# Patient Record
Sex: Male | Born: 1945
Health system: Southern US, Community
[De-identification: ages and names within clinical notes are randomized; demographics above are authoritative.]

## PROBLEM LIST (undated history)

## (undated) DIAGNOSIS — I471 Supraventricular tachycardia: Principal | ICD-10-CM

## (undated) DIAGNOSIS — J42 Unspecified chronic bronchitis: Secondary | ICD-10-CM

## (undated) DIAGNOSIS — J302 Other seasonal allergic rhinitis: Secondary | ICD-10-CM

## (undated) DIAGNOSIS — F419 Anxiety disorder, unspecified: Secondary | ICD-10-CM

## (undated) DIAGNOSIS — Z9289 Personal history of other medical treatment: Secondary | ICD-10-CM

## (undated) DIAGNOSIS — R011 Cardiac murmur, unspecified: Secondary | ICD-10-CM

## (undated) DIAGNOSIS — K429 Umbilical hernia without obstruction or gangrene: Secondary | ICD-10-CM

## (undated) DIAGNOSIS — Z8719 Personal history of other diseases of the digestive system: Secondary | ICD-10-CM

## (undated) DIAGNOSIS — M1990 Unspecified osteoarthritis, unspecified site: Secondary | ICD-10-CM

## (undated) DIAGNOSIS — Z973 Presence of spectacles and contact lenses: Secondary | ICD-10-CM

## (undated) DIAGNOSIS — R112 Nausea with vomiting, unspecified: Secondary | ICD-10-CM

## (undated) DIAGNOSIS — I420 Dilated cardiomyopathy: Secondary | ICD-10-CM

## (undated) DIAGNOSIS — R296 Repeated falls: Secondary | ICD-10-CM

## (undated) DIAGNOSIS — D649 Anemia, unspecified: Secondary | ICD-10-CM

## (undated) DIAGNOSIS — D126 Benign neoplasm of colon, unspecified: Secondary | ICD-10-CM

## (undated) DIAGNOSIS — J189 Pneumonia, unspecified organism: Secondary | ICD-10-CM

## (undated) DIAGNOSIS — F32A Depression, unspecified: Secondary | ICD-10-CM

## (undated) DIAGNOSIS — I491 Atrial premature depolarization: Secondary | ICD-10-CM

## (undated) DIAGNOSIS — I739 Peripheral vascular disease, unspecified: Secondary | ICD-10-CM

## (undated) DIAGNOSIS — I10 Essential (primary) hypertension: Secondary | ICD-10-CM

## (undated) DIAGNOSIS — N400 Enlarged prostate without lower urinary tract symptoms: Secondary | ICD-10-CM

## (undated) DIAGNOSIS — K219 Gastro-esophageal reflux disease without esophagitis: Secondary | ICD-10-CM

## (undated) DIAGNOSIS — F1027 Alcohol dependence with alcohol-induced persisting dementia: Secondary | ICD-10-CM

## (undated) DIAGNOSIS — E785 Hyperlipidemia, unspecified: Secondary | ICD-10-CM

## (undated) DIAGNOSIS — K7460 Unspecified cirrhosis of liver: Secondary | ICD-10-CM

## (undated) DIAGNOSIS — H9190 Unspecified hearing loss, unspecified ear: Secondary | ICD-10-CM

## (undated) DIAGNOSIS — Z9889 Other specified postprocedural states: Secondary | ICD-10-CM

## (undated) DIAGNOSIS — I2510 Atherosclerotic heart disease of native coronary artery without angina pectoris: Secondary | ICD-10-CM

## (undated) DIAGNOSIS — F329 Major depressive disorder, single episode, unspecified: Secondary | ICD-10-CM

## (undated) HISTORY — DX: Anemia, unspecified: D64.9

## (undated) HISTORY — DX: Supraventricular tachycardia: I47.1

## (undated) HISTORY — DX: Hyperlipidemia, unspecified: E78.5

## (undated) HISTORY — PX: HEMORRHOID SURGERY: SHX153

## (undated) HISTORY — DX: Pneumonia, unspecified organism: J18.9

## (undated) HISTORY — DX: Atherosclerotic heart disease of native coronary artery without angina pectoris: I25.10

## (undated) HISTORY — DX: Essential (primary) hypertension: I10

## (undated) HISTORY — PX: TONSILLECTOMY: SUR1361

## (undated) HISTORY — PX: UPPER GASTROINTESTINAL ENDOSCOPY: SHX188

## (undated) HISTORY — DX: Benign prostatic hyperplasia without lower urinary tract symptoms: N40.0

## (undated) HISTORY — DX: Dilated cardiomyopathy: I42.0

## (undated) HISTORY — PX: FRACTURE SURGERY: SHX138

## (undated) HISTORY — DX: Major depressive disorder, single episode, unspecified: F32.9

## (undated) HISTORY — DX: Depression, unspecified: F32.A

## (undated) HISTORY — DX: Benign neoplasm of colon, unspecified: D12.6

## (undated) HISTORY — DX: Gastro-esophageal reflux disease without esophagitis: K21.9

## (undated) HISTORY — DX: Atrial premature depolarization: I49.1

## (undated) HISTORY — PX: BACK SURGERY: SHX140

## (undated) HISTORY — DX: Anxiety disorder, unspecified: F41.9

## (undated) HISTORY — PX: DUPUYTREN CONTRACTURE RELEASE: SHX1478

---

## 1994-01-11 DIAGNOSIS — J189 Pneumonia, unspecified organism: Secondary | ICD-10-CM

## 1994-01-11 HISTORY — DX: Pneumonia, unspecified organism: J18.9

## 1997-05-11 ENCOUNTER — Encounter: Payer: Self-pay | Admitting: Family Medicine

## 1997-05-11 DIAGNOSIS — N400 Enlarged prostate without lower urinary tract symptoms: Secondary | ICD-10-CM

## 1997-05-11 DIAGNOSIS — E785 Hyperlipidemia, unspecified: Secondary | ICD-10-CM

## 1997-05-11 DIAGNOSIS — I10 Essential (primary) hypertension: Secondary | ICD-10-CM

## 1997-05-11 HISTORY — DX: Essential (primary) hypertension: I10

## 1997-05-11 HISTORY — DX: Hyperlipidemia, unspecified: E78.5

## 1997-05-11 HISTORY — DX: Benign prostatic hyperplasia without lower urinary tract symptoms: N40.0

## 1997-05-11 LAB — CONVERTED CEMR LAB: PSA: 2.1 ng/mL

## 1997-06-04 ENCOUNTER — Encounter: Payer: Self-pay | Admitting: Family Medicine

## 1997-06-04 LAB — CONVERTED CEMR LAB: PSA: 2.1 ng/mL

## 1997-08-06 ENCOUNTER — Ambulatory Visit (HOSPITAL_COMMUNITY): Admission: RE | Admit: 1997-08-06 | Discharge: 1997-08-06 | Payer: Self-pay | Admitting: Sports Medicine

## 1997-08-20 ENCOUNTER — Ambulatory Visit (HOSPITAL_COMMUNITY): Admission: RE | Admit: 1997-08-20 | Discharge: 1997-08-20 | Payer: Self-pay | Admitting: Sports Medicine

## 1997-09-03 ENCOUNTER — Ambulatory Visit (HOSPITAL_COMMUNITY): Admission: RE | Admit: 1997-09-03 | Discharge: 1997-09-03 | Payer: Self-pay | Admitting: Neurosurgery

## 1998-06-12 ENCOUNTER — Encounter: Payer: Self-pay | Admitting: Family Medicine

## 1998-06-12 LAB — CONVERTED CEMR LAB: PSA: 2.7 ng/mL

## 1998-06-17 ENCOUNTER — Encounter: Payer: Self-pay | Admitting: Family Medicine

## 1998-06-17 LAB — CONVERTED CEMR LAB: PSA: 2.7 ng/mL

## 1999-08-12 ENCOUNTER — Encounter: Payer: Self-pay | Admitting: Family Medicine

## 1999-08-20 ENCOUNTER — Encounter: Payer: Self-pay | Admitting: Family Medicine

## 2001-08-11 ENCOUNTER — Encounter: Payer: Self-pay | Admitting: Family Medicine

## 2001-08-11 LAB — CONVERTED CEMR LAB: PSA: 2.2 ng/mL

## 2001-08-31 ENCOUNTER — Encounter: Payer: Self-pay | Admitting: Family Medicine

## 2001-08-31 LAB — CONVERTED CEMR LAB: PSA: 2.2 ng/mL

## 2002-06-12 HISTORY — PX: OTHER SURGICAL HISTORY: SHX169

## 2002-10-12 HISTORY — PX: CATARACT EXTRACTION W/ INTRAOCULAR LENS  IMPLANT, BILATERAL: SHX1307

## 2003-01-12 ENCOUNTER — Encounter: Payer: Self-pay | Admitting: Family Medicine

## 2003-01-12 LAB — CONVERTED CEMR LAB: PSA: 2.7 ng/mL

## 2003-02-08 ENCOUNTER — Encounter: Payer: Self-pay | Admitting: Family Medicine

## 2003-02-08 LAB — CONVERTED CEMR LAB: PSA: 2.7 ng/mL

## 2003-06-23 ENCOUNTER — Emergency Department (HOSPITAL_COMMUNITY): Admission: EM | Admit: 2003-06-23 | Discharge: 2003-06-24 | Payer: Self-pay | Admitting: Emergency Medicine

## 2003-12-18 ENCOUNTER — Ambulatory Visit: Payer: Self-pay | Admitting: Family Medicine

## 2003-12-27 ENCOUNTER — Ambulatory Visit: Payer: Self-pay | Admitting: Family Medicine

## 2004-01-12 DIAGNOSIS — D126 Benign neoplasm of colon, unspecified: Secondary | ICD-10-CM

## 2004-01-12 HISTORY — DX: Benign neoplasm of colon, unspecified: D12.6

## 2004-01-20 ENCOUNTER — Ambulatory Visit: Payer: Self-pay | Admitting: Family Medicine

## 2004-01-29 ENCOUNTER — Ambulatory Visit: Payer: Self-pay | Admitting: Family Medicine

## 2004-02-03 ENCOUNTER — Ambulatory Visit: Payer: Self-pay | Admitting: Family Medicine

## 2004-03-02 ENCOUNTER — Other Ambulatory Visit (HOSPITAL_COMMUNITY): Admission: RE | Admit: 2004-03-02 | Discharge: 2004-03-23 | Payer: Self-pay | Admitting: Psychiatry

## 2004-03-02 ENCOUNTER — Ambulatory Visit: Payer: Self-pay | Admitting: Psychiatry

## 2004-03-05 ENCOUNTER — Ambulatory Visit: Payer: Self-pay | Admitting: Family Medicine

## 2004-03-18 ENCOUNTER — Ambulatory Visit: Payer: Self-pay | Admitting: Family Medicine

## 2004-05-22 ENCOUNTER — Ambulatory Visit: Payer: Self-pay | Admitting: Family Medicine

## 2004-06-15 ENCOUNTER — Ambulatory Visit: Payer: Self-pay | Admitting: Family Medicine

## 2004-06-18 ENCOUNTER — Ambulatory Visit: Payer: Self-pay | Admitting: Gastroenterology

## 2004-06-19 ENCOUNTER — Ambulatory Visit: Payer: Self-pay | Admitting: Gastroenterology

## 2004-07-03 ENCOUNTER — Ambulatory Visit: Payer: Self-pay | Admitting: Gastroenterology

## 2004-07-24 ENCOUNTER — Ambulatory Visit: Payer: Self-pay | Admitting: Family Medicine

## 2004-07-31 ENCOUNTER — Encounter (INDEPENDENT_AMBULATORY_CARE_PROVIDER_SITE_OTHER): Payer: Self-pay | Admitting: *Deleted

## 2004-07-31 ENCOUNTER — Ambulatory Visit: Payer: Self-pay | Admitting: Gastroenterology

## 2004-07-31 DIAGNOSIS — K219 Gastro-esophageal reflux disease without esophagitis: Secondary | ICD-10-CM

## 2004-07-31 HISTORY — DX: Gastro-esophageal reflux disease without esophagitis: K21.9

## 2004-07-31 HISTORY — PX: COLONOSCOPY: SHX174

## 2004-08-14 ENCOUNTER — Ambulatory Visit: Payer: Self-pay | Admitting: Gastroenterology

## 2004-08-24 ENCOUNTER — Ambulatory Visit (HOSPITAL_COMMUNITY): Admission: RE | Admit: 2004-08-24 | Discharge: 2004-08-24 | Payer: Self-pay | Admitting: Gastroenterology

## 2004-08-24 ENCOUNTER — Encounter (INDEPENDENT_AMBULATORY_CARE_PROVIDER_SITE_OTHER): Payer: Self-pay | Admitting: Specialist

## 2004-09-22 ENCOUNTER — Ambulatory Visit: Payer: Self-pay | Admitting: Gastroenterology

## 2004-10-13 ENCOUNTER — Ambulatory Visit: Payer: Self-pay | Admitting: Family Medicine

## 2004-11-17 ENCOUNTER — Ambulatory Visit: Payer: Self-pay | Admitting: Family Medicine

## 2004-12-01 ENCOUNTER — Ambulatory Visit: Payer: Self-pay | Admitting: Family Medicine

## 2004-12-31 ENCOUNTER — Ambulatory Visit: Payer: Self-pay | Admitting: Family Medicine

## 2005-07-01 ENCOUNTER — Ambulatory Visit: Payer: Self-pay | Admitting: Family Medicine

## 2005-07-01 LAB — CONVERTED CEMR LAB: PSA: 1.73 ng/mL

## 2005-07-05 ENCOUNTER — Ambulatory Visit: Payer: Self-pay | Admitting: Family Medicine

## 2006-01-28 ENCOUNTER — Ambulatory Visit: Payer: Self-pay | Admitting: Family Medicine

## 2006-03-04 ENCOUNTER — Ambulatory Visit: Payer: Self-pay | Admitting: Family Medicine

## 2006-03-23 ENCOUNTER — Ambulatory Visit: Payer: Self-pay | Admitting: Family Medicine

## 2006-07-28 ENCOUNTER — Encounter: Payer: Self-pay | Admitting: Family Medicine

## 2006-07-28 DIAGNOSIS — L719 Rosacea, unspecified: Secondary | ICD-10-CM

## 2006-07-28 DIAGNOSIS — N400 Enlarged prostate without lower urinary tract symptoms: Secondary | ICD-10-CM

## 2006-07-28 DIAGNOSIS — R7309 Other abnormal glucose: Secondary | ICD-10-CM

## 2006-07-28 DIAGNOSIS — N433 Hydrocele, unspecified: Secondary | ICD-10-CM

## 2006-07-28 DIAGNOSIS — J301 Allergic rhinitis due to pollen: Secondary | ICD-10-CM

## 2006-07-28 DIAGNOSIS — E785 Hyperlipidemia, unspecified: Secondary | ICD-10-CM | POA: Insufficient documentation

## 2006-07-28 DIAGNOSIS — J45909 Unspecified asthma, uncomplicated: Secondary | ICD-10-CM | POA: Insufficient documentation

## 2006-07-28 DIAGNOSIS — F1010 Alcohol abuse, uncomplicated: Secondary | ICD-10-CM

## 2006-07-28 DIAGNOSIS — N529 Male erectile dysfunction, unspecified: Secondary | ICD-10-CM

## 2006-07-28 DIAGNOSIS — R945 Abnormal results of liver function studies: Secondary | ICD-10-CM | POA: Insufficient documentation

## 2006-07-28 DIAGNOSIS — I10 Essential (primary) hypertension: Secondary | ICD-10-CM

## 2006-11-28 ENCOUNTER — Encounter: Payer: Self-pay | Admitting: Family Medicine

## 2007-01-24 ENCOUNTER — Ambulatory Visit: Payer: Self-pay | Admitting: Family Medicine

## 2007-01-24 DIAGNOSIS — F411 Generalized anxiety disorder: Secondary | ICD-10-CM

## 2007-03-07 ENCOUNTER — Observation Stay (HOSPITAL_COMMUNITY): Admission: EM | Admit: 2007-03-07 | Discharge: 2007-03-08 | Payer: Self-pay | Admitting: Emergency Medicine

## 2007-03-07 ENCOUNTER — Ambulatory Visit: Payer: Self-pay | Admitting: Cardiology

## 2007-03-08 HISTORY — PX: OTHER SURGICAL HISTORY: SHX169

## 2007-03-09 ENCOUNTER — Ambulatory Visit: Payer: Self-pay

## 2007-03-09 HISTORY — PX: OTHER SURGICAL HISTORY: SHX169

## 2007-03-16 ENCOUNTER — Ambulatory Visit: Payer: Self-pay | Admitting: Cardiovascular Disease

## 2007-04-03 ENCOUNTER — Ambulatory Visit: Payer: Self-pay | Admitting: Family Medicine

## 2007-04-03 DIAGNOSIS — E876 Hypokalemia: Secondary | ICD-10-CM

## 2007-04-03 DIAGNOSIS — H9190 Unspecified hearing loss, unspecified ear: Secondary | ICD-10-CM | POA: Insufficient documentation

## 2007-04-04 LAB — CONVERTED CEMR LAB
ALT: 83 units/L — ABNORMAL HIGH (ref 0–53)
Albumin: 4.5 g/dL (ref 3.5–5.2)
Bilirubin, Direct: 0.3 mg/dL (ref 0.0–0.3)
Calcium: 9.3 mg/dL (ref 8.4–10.5)
GFR calc Af Amer: 111 mL/min
GFR calc non Af Amer: 91 mL/min
Total Protein: 7.3 g/dL (ref 6.0–8.3)

## 2007-04-25 ENCOUNTER — Telehealth: Payer: Self-pay | Admitting: Family Medicine

## 2007-05-09 ENCOUNTER — Telehealth: Payer: Self-pay | Admitting: Family Medicine

## 2007-05-15 ENCOUNTER — Telehealth: Payer: Self-pay | Admitting: Family Medicine

## 2007-07-04 ENCOUNTER — Telehealth: Payer: Self-pay | Admitting: Family Medicine

## 2007-07-13 ENCOUNTER — Telehealth: Payer: Self-pay | Admitting: Family Medicine

## 2007-09-05 ENCOUNTER — Telehealth: Payer: Self-pay | Admitting: Family Medicine

## 2007-10-16 ENCOUNTER — Telehealth: Payer: Self-pay | Admitting: Family Medicine

## 2008-02-12 ENCOUNTER — Telehealth: Payer: Self-pay | Admitting: Family Medicine

## 2008-02-19 ENCOUNTER — Ambulatory Visit: Payer: Self-pay | Admitting: Family Medicine

## 2008-02-19 DIAGNOSIS — R110 Nausea: Secondary | ICD-10-CM

## 2008-02-19 DIAGNOSIS — S139XXA Sprain of joints and ligaments of unspecified parts of neck, initial encounter: Secondary | ICD-10-CM | POA: Insufficient documentation

## 2008-02-19 LAB — CONVERTED CEMR LAB
AST: 124 units/L — ABNORMAL HIGH (ref 0–37)
Alkaline Phosphatase: 80 units/L (ref 39–117)
Basophils Relative: 0.4 % (ref 0.0–3.0)
Bilirubin, Direct: 0.1 mg/dL (ref 0.0–0.3)
Chloride: 100 meq/L (ref 96–112)
Creatinine, Ser: 0.8 mg/dL (ref 0.4–1.5)
GFR calc Af Amer: 126 mL/min
GFR calc non Af Amer: 104 mL/min
Hemoglobin: 13.1 g/dL (ref 13.0–17.0)
Lymphocytes Relative: 17.9 % (ref 12.0–46.0)
Magnesium: 1.8 mg/dL (ref 1.5–2.5)
Monocytes Absolute: 0.5 10*3/uL (ref 0.1–1.0)
Monocytes Relative: 11.6 % (ref 3.0–12.0)
Neutro Abs: 3.2 10*3/uL (ref 1.4–7.7)
RDW: 12.7 % (ref 11.5–14.6)
Total Bilirubin: 0.8 mg/dL (ref 0.3–1.2)
Total Protein: 6.8 g/dL (ref 6.0–8.3)
WBC: 4.5 10*3/uL (ref 4.5–10.5)

## 2008-02-26 ENCOUNTER — Ambulatory Visit: Payer: Self-pay | Admitting: Family Medicine

## 2008-03-19 ENCOUNTER — Telehealth: Payer: Self-pay | Admitting: Family Medicine

## 2008-05-15 ENCOUNTER — Telehealth: Payer: Self-pay | Admitting: Family Medicine

## 2008-09-27 ENCOUNTER — Telehealth: Payer: Self-pay | Admitting: Family Medicine

## 2008-10-17 ENCOUNTER — Ambulatory Visit: Payer: Self-pay | Admitting: Family Medicine

## 2008-11-01 ENCOUNTER — Ambulatory Visit: Payer: Self-pay | Admitting: Family Medicine

## 2008-11-01 DIAGNOSIS — R238 Other skin changes: Secondary | ICD-10-CM | POA: Insufficient documentation

## 2008-11-04 LAB — CONVERTED CEMR LAB
ALT: 37 units/L (ref 0–53)
AST: 88 units/L — ABNORMAL HIGH (ref 0–37)
Albumin: 3.6 g/dL (ref 3.5–5.2)
Alkaline Phosphatase: 98 units/L (ref 39–117)
Eosinophils Relative: 0.5 % (ref 0.0–5.0)
Lymphocytes Relative: 16.7 % (ref 12.0–46.0)
MCHC: 35.6 g/dL (ref 30.0–36.0)
MCV: 97 fL (ref 78.0–100.0)
Monocytes Absolute: 0.6 10*3/uL (ref 0.1–1.0)
Monocytes Relative: 11 % (ref 3.0–12.0)
RDW: 12.8 % (ref 11.5–14.6)
Total Bilirubin: 0.8 mg/dL (ref 0.3–1.2)
Total Protein: 6.5 g/dL (ref 6.0–8.3)

## 2009-05-07 ENCOUNTER — Telehealth: Payer: Self-pay | Admitting: Family Medicine

## 2009-05-12 ENCOUNTER — Encounter: Payer: Self-pay | Admitting: Family Medicine

## 2009-06-20 ENCOUNTER — Ambulatory Visit: Payer: Self-pay | Admitting: Family Medicine

## 2009-06-20 ENCOUNTER — Telehealth: Payer: Self-pay | Admitting: Family Medicine

## 2009-06-20 ENCOUNTER — Ambulatory Visit (HOSPITAL_COMMUNITY): Admission: RE | Admit: 2009-06-20 | Discharge: 2009-06-20 | Payer: Self-pay | Admitting: Family Medicine

## 2009-06-20 DIAGNOSIS — R0602 Shortness of breath: Secondary | ICD-10-CM

## 2009-06-20 DIAGNOSIS — R188 Other ascites: Secondary | ICD-10-CM | POA: Insufficient documentation

## 2009-06-20 DIAGNOSIS — R142 Eructation: Secondary | ICD-10-CM

## 2009-06-20 DIAGNOSIS — R609 Edema, unspecified: Secondary | ICD-10-CM

## 2009-06-20 DIAGNOSIS — R141 Gas pain: Secondary | ICD-10-CM

## 2009-06-20 DIAGNOSIS — R143 Flatulence: Secondary | ICD-10-CM

## 2009-06-23 ENCOUNTER — Ambulatory Visit: Payer: Self-pay | Admitting: Family Medicine

## 2009-06-23 ENCOUNTER — Ambulatory Visit: Payer: Self-pay | Admitting: Internal Medicine

## 2009-06-23 LAB — CONVERTED CEMR LAB
AST: 120 units/L — ABNORMAL HIGH (ref 0–37)
Albumin: 3 g/dL — ABNORMAL LOW (ref 3.5–5.2)
BUN: 3 mg/dL — ABNORMAL LOW (ref 6–23)
Basophils Absolute: 0 10*3/uL (ref 0.0–0.1)
Bilirubin, Direct: 0.6 mg/dL — ABNORMAL HIGH (ref 0.0–0.3)
Eosinophils Absolute: 0 10*3/uL (ref 0.0–0.7)
GFR calc non Af Amer: 110.15 mL/min (ref >60)
HCT: 33.5 % — ABNORMAL LOW (ref 39.0–52.0)
Lymphocytes Relative: 14.2 % (ref 12.0–46.0)
MCV: 97.3 fL (ref 78.0–100.0)
Neutro Abs: 6.5 10*3/uL (ref 1.4–7.7)
Neutrophils Relative %: 73.7 % (ref 43.0–77.0)
Platelets: 159 10*3/uL (ref 150.0–400.0)
Sodium: 128 meq/L — ABNORMAL LOW (ref 135–145)
Total Bilirubin: 1.4 mg/dL — ABNORMAL HIGH (ref 0.3–1.2)
Total Protein: 6.1 g/dL (ref 6.0–8.3)

## 2009-06-24 ENCOUNTER — Ambulatory Visit: Payer: Self-pay

## 2009-06-24 ENCOUNTER — Encounter: Payer: Self-pay | Admitting: Family Medicine

## 2009-06-25 ENCOUNTER — Telehealth: Payer: Self-pay | Admitting: Family Medicine

## 2009-06-30 ENCOUNTER — Inpatient Hospital Stay (HOSPITAL_COMMUNITY): Admission: AD | Admit: 2009-06-30 | Discharge: 2009-07-02 | Payer: Self-pay | Admitting: Internal Medicine

## 2009-06-30 ENCOUNTER — Telehealth: Payer: Self-pay | Admitting: Family Medicine

## 2009-07-02 ENCOUNTER — Ambulatory Visit: Payer: Self-pay | Admitting: Psychiatry

## 2009-07-15 ENCOUNTER — Telehealth (INDEPENDENT_AMBULATORY_CARE_PROVIDER_SITE_OTHER): Payer: Self-pay | Admitting: *Deleted

## 2009-07-16 ENCOUNTER — Telehealth: Payer: Self-pay | Admitting: Family Medicine

## 2009-07-25 ENCOUNTER — Ambulatory Visit: Payer: Self-pay | Admitting: Family Medicine

## 2009-07-25 DIAGNOSIS — K7030 Alcoholic cirrhosis of liver without ascites: Secondary | ICD-10-CM

## 2009-07-28 LAB — CONVERTED CEMR LAB
CO2: 26 meq/L (ref 19–32)
GFR calc non Af Amer: 83.1 mL/min (ref >60)
Glucose, Bld: 92 mg/dL (ref 70–99)

## 2009-08-11 ENCOUNTER — Ambulatory Visit: Payer: Self-pay | Admitting: Family Medicine

## 2009-08-11 LAB — CONVERTED CEMR LAB: Sodium: 135 meq/L (ref 135–145)

## 2009-10-14 ENCOUNTER — Ambulatory Visit: Payer: Self-pay | Admitting: Family Medicine

## 2009-10-14 ENCOUNTER — Telehealth: Payer: Self-pay | Admitting: Family Medicine

## 2009-10-14 DIAGNOSIS — M79609 Pain in unspecified limb: Secondary | ICD-10-CM

## 2009-10-14 DIAGNOSIS — R413 Other amnesia: Secondary | ICD-10-CM

## 2009-10-14 DIAGNOSIS — R42 Dizziness and giddiness: Secondary | ICD-10-CM

## 2009-10-15 LAB — CONVERTED CEMR LAB
Albumin: 4.2 g/dL (ref 3.5–5.2)
Alkaline Phosphatase: 70 units/L (ref 39–117)
BUN: 9 mg/dL (ref 6–23)
Basophils Absolute: 0.1 10*3/uL (ref 0.0–0.1)
Bilirubin, Direct: 0.3 mg/dL (ref 0.0–0.3)
CO2: 25 meq/L (ref 19–32)
Chloride: 88 meq/L — ABNORMAL LOW (ref 96–112)
Eosinophils Absolute: 0 10*3/uL (ref 0.0–0.7)
Eosinophils Relative: 0.5 % (ref 0.0–5.0)
Glucose, Bld: 95 mg/dL (ref 70–99)
HCT: 33.4 % — ABNORMAL LOW (ref 39.0–52.0)
Lymphs Abs: 1.2 10*3/uL (ref 0.7–4.0)
MCV: 94.6 fL (ref 78.0–100.0)
Platelets: 168 10*3/uL (ref 150.0–400.0)
Potassium: 5.3 meq/L — ABNORMAL HIGH (ref 3.5–5.1)
RDW: 13.8 % (ref 11.5–14.6)
Sodium: 122 meq/L — ABNORMAL LOW (ref 135–145)

## 2009-10-17 ENCOUNTER — Ambulatory Visit: Payer: Self-pay | Admitting: Family Medicine

## 2009-10-17 DIAGNOSIS — E871 Hypo-osmolality and hyponatremia: Secondary | ICD-10-CM | POA: Insufficient documentation

## 2009-10-20 LAB — CONVERTED CEMR LAB
BUN: 10 mg/dL (ref 6–23)
CO2: 25 meq/L (ref 19–32)
Calcium: 9.3 mg/dL (ref 8.4–10.5)
Chloride: 89 meq/L — ABNORMAL LOW (ref 96–112)
GFR calc non Af Amer: 86.1 mL/min (ref >60)
Glucose, Bld: 87 mg/dL (ref 70–99)

## 2009-10-24 ENCOUNTER — Ambulatory Visit: Payer: Self-pay | Admitting: Family Medicine

## 2009-10-27 LAB — CONVERTED CEMR LAB
BUN: 10 mg/dL (ref 6–23)
Calcium: 9 mg/dL (ref 8.4–10.5)
GFR calc non Af Amer: 87.16 mL/min (ref >60)
Sodium: 125 meq/L — ABNORMAL LOW (ref 135–145)

## 2009-10-31 ENCOUNTER — Ambulatory Visit: Payer: Self-pay | Admitting: Family Medicine

## 2009-11-03 ENCOUNTER — Telehealth: Payer: Self-pay | Admitting: Family Medicine

## 2009-11-03 LAB — CONVERTED CEMR LAB
BUN: 7 mg/dL (ref 6–23)
Eosinophils Absolute: 0 10*3/uL (ref 0.0–0.7)
Eosinophils Relative: 0.1 % (ref 0.0–5.0)
Hemoglobin: 12.1 g/dL — ABNORMAL LOW (ref 13.0–17.0)
Lymphocytes Relative: 13 % (ref 12.0–46.0)
MCHC: 35.1 g/dL (ref 30.0–36.0)
MCV: 95.2 fL (ref 78.0–100.0)
Monocytes Absolute: 0.8 10*3/uL (ref 0.1–1.0)
Platelets: 171 10*3/uL (ref 150.0–400.0)
RDW: 15.3 % — ABNORMAL HIGH (ref 11.5–14.6)
Sodium: 128 meq/L — ABNORMAL LOW (ref 135–145)
TSH: 0.9 microintl units/mL (ref 0.35–5.50)
Testosterone: 490.63 ng/dL (ref 350.00–890.00)

## 2009-12-15 ENCOUNTER — Telehealth: Payer: Self-pay | Admitting: Family Medicine

## 2009-12-26 ENCOUNTER — Inpatient Hospital Stay (HOSPITAL_COMMUNITY)
Admission: EM | Admit: 2009-12-26 | Discharge: 2009-12-29 | Payer: Self-pay | Source: Home / Self Care | Attending: Internal Medicine | Admitting: Internal Medicine

## 2010-01-06 ENCOUNTER — Telehealth: Payer: Self-pay | Admitting: Family Medicine

## 2010-01-09 ENCOUNTER — Ambulatory Visit: Payer: Self-pay | Admitting: Family Medicine

## 2010-01-11 DIAGNOSIS — D649 Anemia, unspecified: Secondary | ICD-10-CM

## 2010-01-11 DIAGNOSIS — Z9289 Personal history of other medical treatment: Secondary | ICD-10-CM

## 2010-01-11 HISTORY — DX: Personal history of other medical treatment: Z92.89

## 2010-01-11 HISTORY — DX: Anemia, unspecified: D64.9

## 2010-01-13 LAB — CONVERTED CEMR LAB
ALT: 19 units/L (ref 0–53)
Albumin: 3.9 g/dL (ref 3.5–5.2)
Alkaline Phosphatase: 58 units/L (ref 39–117)
BUN: 12 mg/dL (ref 6–23)
Basophils Relative: 0.7 % (ref 0.0–3.0)
Bilirubin, Direct: 0.2 mg/dL (ref 0.0–0.3)
CO2: 24 meq/L (ref 19–32)
Creatinine, Ser: 1.1 mg/dL (ref 0.4–1.5)
Eosinophils Relative: 0.7 % (ref 0.0–5.0)
Glucose, Bld: 90 mg/dL (ref 70–99)
Hemoglobin: 12.1 g/dL — ABNORMAL LOW (ref 13.0–17.0)
Lymphocytes Relative: 9.6 % — ABNORMAL LOW (ref 12.0–46.0)
MCHC: 34.3 g/dL (ref 30.0–36.0)
MCV: 97.4 fL (ref 78.0–100.0)
Monocytes Relative: 9 % (ref 3.0–12.0)
Neutrophils Relative %: 80 % — ABNORMAL HIGH (ref 43.0–77.0)
Platelets: 351 10*3/uL (ref 150.0–400.0)
Sodium: 134 meq/L — ABNORMAL LOW (ref 135–145)
Total Bilirubin: 0.9 mg/dL (ref 0.3–1.2)
Total Protein: 7.2 g/dL (ref 6.0–8.3)

## 2010-02-05 ENCOUNTER — Telehealth: Payer: Self-pay | Admitting: Family Medicine

## 2010-02-09 ENCOUNTER — Telehealth: Payer: Self-pay | Admitting: Family Medicine

## 2010-02-12 NOTE — Progress Notes (Signed)
Summary: refill request for flexeril  Phone Note Refill Request Message from:  Fax from Pharmacy  Refills Requested: Medication #1:  FLEXERIL 10 MG TABS 1/2 tab by mouth AM and midafternoon Walk in request from pt.  [facility].  Initial call taken by: Lowella Petties CMA, AAMA,  December 15, 2009 4:47 PM  Follow-up for Phone Call        what would he like this for?  Pt really needs to be seen prior to being prescribed a muscle relaxant. Ruthe Mannan MD  December 16, 2009 7:39 AM  [facility] for pt to call.                Lowella Petties CMA, AAMA  December 16, 2009 8:30 AM  Patient says that he has been taking this for a couple of years fo rthe muscle spasms in his back .  Melody Comas  December 16, 2009 3:24 PM     New/Updated Medications: CYCLOBENZAPRINE HCL 10 MG  TABS (CYCLOBENZAPRINE HCL) 1 by mouth 2 times daily as needed for back pain Prescriptions: CYCLOBENZAPRINE HCL 10 MG  TABS (CYCLOBENZAPRINE HCL) 1 by mouth 2 times daily as needed for back pain  #20 x 0   Entered and Authorized by:   Ruthe Mannan MD   Signed by:   Ruthe Mannan MD on 12/17/2009   Method used:   [facility]        [facility] (retail)       [address]       [place], [place]  [address]       Ph: [phone] or [phone]       Fax: [phone]   RxID:   [id number]

## 2010-02-12 NOTE — Progress Notes (Signed)
Summary: Rx Furosemide  Phone Note Refill Request Call back at [phone] Message [facility]:  [facility] on July 15, 2009 2:13 PM  Refills Requested: Medication #1:  FUROSEMIDE 40 MG TABS take one tablet daily x 5 days Received E-script request please advise.  Is this patient suppose to continue this medication or was it just only for the 5 days?     Method Requested: Electronic Initial call taken by: Linde Gillis CMA Duncan Dull),  July 15, 2009 2:14 PM  Follow-up for Phone Call        I do not think he supposed to be taking this anymore but he did not follow up with me since he was discharged.  Please do not refill. Ruthe Mannan MD  July 15, 2009 3:04 PM  Left message on machine at home for patient to return call.  He needs to schedule a 30 minute hospital f/u appt as well when he calls back.  Left message on machine at home for patient to return call.  Linde Gillis CMA Duncan Dull)  July 16, 2009 9:38 AM    Additional Follow-up for Phone Call Additional follow up Details #1::        Advised pt's wife, she will have pt call back to schedule visit. Additional Follow-up by: Lowella Petties CMA,  July 16, 2009 10:31 AM

## 2010-02-12 NOTE — Assessment & Plan Note (Signed)
Summary: ONE WEEK FOLLOW UP/NT   Vital Signs:  Patient profile:   65 year old male Height:      69 inches Weight:      182 pounds BMI:     26.97 Temp:     98.1 degrees F oral Pulse rate:   86 / minute Pulse rhythm:   regular BP sitting:   122 / 80  (right arm) Cuff size:   regular  Vitals Entered By: Linde Gillis CMA Duncan Dull) (October 31, 2009 10:37 AM) CC: 1 week follow up    History of Present Illness: 65 yo here for follow dizziness, hypnatremia with some other issues to talk about.    Dizziness/hyponatremia- complicated by his PMH.  Has known alcoholic cirrhosis and continues to abuse ETOH.  He is on several antihypertensives due to his HTN and ascites in past.  Dizziness improved when we decreased his Spironolactone to 50 mg daily on 10/17/2009.  Was also found to be hyponatremic at that time, Na 124.  Also decreased Lasix to 20 mg daily.  Na on 10/17 was 125 and we have now decreased Lasix to 20 mg every other day.   Weight has been stable, feels better from this standpoint.  ETOH abuse/cirrhosis- very tearful today, reports that his drinking is "disgusting" so he quit drinking cold [place] last week.  Felt a little shaky at first, that is improving but he is very tearful and anxious. Mother in law is also in the ICU right now which make him feel guilty as well.  His wife is unaware that he has quit drinking.  Current Medications (verified): 1)  Fexofenadine Hcl 180 Mg Tabs (Fexofenadine Hcl) .[name].. 1 Tablet Daily As Needed By Mouth 2)  Simvastatin 40 Mg  Tabs (Simvastatin) .[name].. 1 Tablet By Mouth At Bedtime 3)  Bayer Aspirin 325 Mg  Tabs (Aspirin) .[name].. 1 Tablet Daily By Mouth 4)  Cardura 8 Mg  Tabs (Doxazosin Mesylate) .[name].. 1 At Bedtime By Mouth 5)  Flexeril 10 Mg Tabs (Cyclobenzaprine Hcl) .... 1/2 Tab By Mouth Am and Midafternoon, Whole Tab At Night. 6)  Furosemide 20 Mg Tabs (Furosemide) .... Take 1 Tab By Mouth Every Other Day 7)  Fluticasone Propionate 50 Mcg/act  Susp  (Fluticasone Propionate) .... 2 Sprays Each Nostril Once Daily 8)  Spironolactone 50 Mg Tabs (Spironolactone) .[name].. 1 Tab By Mouth Daily 9)  Alprazolam 0.25 Mg Tabs (Alprazolam) .[name].. 1 Tab By Mouth Three Times A Day As Needed Anxiety.  Allergies: 1)  ! Procardia  Past History:  Past Medical History: Last updated: Jul 30, 2006 Hyperlipidemia: 05/1997 Hypertension : 05/1997 Benign prostatic hypertrophy05/1999  Past Surgical History: Last updated: 04/03/2007 HOSP PNEUMONIA LEFT DUPUTRYEN CONTRACTIVE SURGERY NECK MRI C5-6 DISC ABNORMALITY (DR. KRITZER) :(06/2002) COLONOSCOPY MULTIPLE POLYPS B-9 (07/31/2004) REPEAT IN 3 YEARS EGD BX NEGATIVE METPASIA:(07/31/2004) EGD REFLUX ESOPHAGITIS H.H. GERD GASTRITIS: (07/31/2004) HOSP CP R/O'D 2 /24 - 2/25//2009 [facility]  NML EF 66% 03/09/2007  Family History: Last updated: 07-30-06 Father: DECEASED 82YOA:CABG X 5/ MI, HTN, DM, CHF, STROKES Mother: DECEASED 21 YOA DEPRESSION / PAXIL/ NATURAL CAUSES Siblings: 2 BROTHERS 1 BROTHER CABG CONTINUES TO SMOKE 1 SISTER CV: + FATHER , MI HBP: + FATHER , + SELF DM: + FATHER GOUT/ARTHRITIS: NEGATIVE PROSTATE/CANCER: COLON CANCER: BREAST/OVARIAN/UTERINE CANCER: NEGATIVE DEPRESSION: + MOTHER ETOH/DRUG ABUSE: + BROTHER OTHER: + STROKES FATHER  Social History: Last updated: 07-30-2006 Marital Status: MarriedLIVES WITH WIFE REMARRIED  Children: 3  / 1 AT HOME Occupation: [employer]  [employer]  Risk Factors: Smoking Status: never (07/28/2006)  Review of Systems      See HPI General:  Denies malaise. Eyes:  Denies blurring. Resp:  Denies cough, shortness of breath, and wheezing. GI:  Denies abdominal pain, bloody stools, change in bowel habits, nausea, and vomiting. MS:  Denies joint pain, joint redness, and joint swelling. Psych:  Complains of anxiety, depression, easily tearful, and irritability; denies sense of great danger, suicidal thoughts/plans, thoughts of violence,  unusual visions or sounds, and thoughts /plans of harming others.  Physical Exam  General:  alert and well-developed, tearful but appropriate. Weight down 2 pounds since 10/4 Head:  normocephalic and atraumatic.   Eyes:  vision grossly intact, pupils equal, and pupils round.   Ears:  R ear normal and L ear normal.   Mouth:  Oral mucosa and oropharynx without lesions or exudates.  Teeth in good repair. No obvious gum bleeds. Lungs:  Normal respiratory effort, chest expands symmetrically. Lungs are clear to auscultation, no crackles or wheezes. Heart:  Normal rate and regular rhythm. S1 and S2 normal without gallop, murmur, click, rub or other extra sounds. Abdomen:  less distended, pos BS,  no tenderness, no guarding Extremities:  no edema Neurologic:  alert & oriented X3.   A little shaky, no asterixix Psych:  normally interactive and good eye contact.     Impression & Recommendations:  Problem # 1:  HYPOSMOLALITY AND/OR HYPONATREMIA (ICD-276.1) Assessment Unchanged Reheck BMET today.  Will continue to improve if he can obstain from ETOH. Orders: Venipuncture (11914) TLB-BMP (Basic Metabolic Panel-BMET) (80048-METABOL)  Problem # 2:  ALCOHOL ABUSE (ICD-305.00) Assessment: Improved No signs of DTs at this time.  Will place on Benzo to help with anxiety and to prevent withdrawl.  Pt in agreement with plan.  Problem # 3:  ALCOHOLIC CIRRHOSIS OF LIVER (ICD-571.2) Assessment: Unchanged Recheck labs today- CBC, TSH, testosterone. Orders: TLB-CBC Platelet - w/Differential (85025-CBCD) TLB-TSH (Thyroid Stimulating Hormone) (84443-TSH) TLB-Testosterone, Total (84403-TESTO)  Complete Medication List: 1)  Fexofenadine Hcl 180 Mg Tabs (Fexofenadine hcl) .[name].. 1 tablet daily as needed by mouth 2)  Simvastatin 40 Mg Tabs (Simvastatin) .[name].. 1 tablet by mouth at bedtime 3)  Bayer Aspirin 325 Mg Tabs (Aspirin) .[name].. 1 tablet daily by mouth 4)  Cardura 8 Mg Tabs (Doxazosin mesylate) .[name].. 1  at bedtime by mouth 5)  Flexeril 10 Mg Tabs (Cyclobenzaprine hcl) .... 1/2 tab by mouth am and midafternoon, whole tab at night. 6)  Furosemide 20 Mg Tabs (Furosemide) .... Take 1 tab by mouth every other day 7)  Fluticasone Propionate 50 Mcg/act Susp (Fluticasone propionate) .... 2 sprays each nostril once daily 8)  Spironolactone 50 Mg Tabs (Spironolactone) .[name].. 1 tab by mouth daily 9)  Alprazolam 0.25 Mg Tabs (Alprazolam) .[name].. 1 tab by mouth three times a day as needed anxiety. Prescriptions: ALPRAZOLAM 0.25 MG TABS (ALPRAZOLAM) 1 tab by mouth three times a day as needed anxiety.  #90 x 0   Entered and Authorized by:   Ruthe Mannan MD   Signed by:   Ruthe Mannan MD on 10/31/2009   Method used:   Print then Give to Patient   RxID:   [id number]    Orders Added: 1)  Venipuncture [69629] 2)  TLB-BMP (Basic Metabolic Panel-BMET) [80048-METABOL] 3)  TLB-CBC Platelet - w/Differential [85025-CBCD] 4)  TLB-TSH (Thyroid Stimulating Hormone) [84443-TSH] 5)  TLB-Testosterone, Total [84403-TESTO] 6)  Est. Patient Level IV [52841]    Current Allergies (reviewed today): ! PROCARDIA

## 2010-02-12 NOTE — Assessment & Plan Note (Signed)
Summary: DIZZY,DROPPED HAMMER ON ANKLE/CLE   Vital Signs:  Patient profile:   65 year old male Height:      69 inches Weight:      184 pounds BMI:     27.27 Temp:     98.9 degrees F oral Pulse rate:   100 / minute Pulse (ortho):   102 / minute Pulse rhythm:   regular BP sitting:   122 / 80  (right arm) BP standing:   110 / 72 Cuff size:   regular  Vitals Entered By: Linde Gillis CMA Duncan Dull) (October 14, 2009 12:05 PM)  Serial Vital Signs/Assessments:  Time      Position  BP       Pulse  Resp  Temp     By 12:32 PM  Lying LA  140/80   93                    Nikki Thorpe CMA (AAMA) 12:32 PM  Sitting   130/80   97                    Nikki Thorpe CMA (AAMA) 12:32 PM  Standing  110/72   102                   Nikki Thorpe CMA (AAMA)  CC: dizzy, dropped a hammer on ankle, forgetful   History of Present Illness: 65 yo here for dizziness, forgetfulness and dropping a hammer on right ankle.  Ankle pain- dropped hammer on right ankle 3 weeks ago.  Was swollen when it happened, now just bruised and TTP.  No pain with weight bearing.    Dizziness- complicated by his PMH.  Has known alcoholic cirrhosis and continues to abuse ETOH.  He is on several antihypertensives due to his HTN and ascites in past.  Dizziness has been occuring for past several weeks when he stands from a seated position.  No sycope.  No nausea or vomiting.  No headaches or other focal neurological deficits.  Forgetfullness- on Vit B1 and B12.  Has FH of Alzheimers and very concerned this is what he is developping.  He is having short term memory problems.  Current Medications (verified): 1)  Fexofenadine Hcl 180 Mg Tabs (Fexofenadine Hcl) .[name].. 1 Tablet Daily As Needed By Mouth 2)  Simvastatin 40 Mg  Tabs (Simvastatin) .[name].. 1 Tablet By Mouth At Bedtime 3)  Bayer Aspirin 325 Mg  Tabs (Aspirin) .[name].. 1 Tablet Daily By Mouth 4)  Cardura 8 Mg  Tabs (Doxazosin Mesylate) .[name].. 1 At Bedtime By Mouth 5)  Flexeril 10 Mg Tabs  (Cyclobenzaprine Hcl) .... 1/2 Tab By Mouth Am and Midafternoon, Whole Tab At Night. 6)  Furosemide 40 Mg Tabs (Furosemide) .... Take One Tablet Daily 7)  Fluticasone Propionate 50 Mcg/act  Susp (Fluticasone Propionate) .... 2 Sprays Each Nostril Once Daily 8)  Spironolactone 50 Mg Tabs (Spironolactone) .[name].. 1 Tab By Mouth Daily  Allergies: 1)  ! Procardia  Past History:  Past Medical History: Last updated: Aug 10, 2006 Hyperlipidemia: 05/1997 Hypertension : 05/1997 Benign prostatic hypertrophy05/1999  Past Surgical History: Last updated: 04/03/2007 HOSP PNEUMONIA LEFT DUPUTRYEN CONTRACTIVE SURGERY NECK MRI C5-6 DISC ABNORMALITY (DR. KRITZER) :(06/2002) COLONOSCOPY MULTIPLE POLYPS B-9 (07/31/2004) REPEAT IN 3 YEARS EGD BX NEGATIVE METPASIA:(07/31/2004) EGD REFLUX ESOPHAGITIS H.H. GERD GASTRITIS: (07/31/2004) HOSP CP R/O'D 2 /24 - 2/25//2009 [facility]  NML EF 66% 03/09/2007  Family History: Last updated: 08-10-2006 Father: DECEASED 82YOA:CABG X 5/ MI,  HTN, DM, CHF, STROKES Mother: DECEASED 31 YOA DEPRESSION / PAXIL/ NATURAL CAUSES Siblings: 2 BROTHERS 1 BROTHER CABG CONTINUES TO SMOKE 1 SISTER CV: + FATHER , MI HBP: + FATHER , + SELF DM: + FATHER GOUT/ARTHRITIS: NEGATIVE PROSTATE/CANCER: COLON CANCER: BREAST/OVARIAN/UTERINE CANCER: NEGATIVE DEPRESSION: + MOTHER ETOH/DRUG ABUSE: + BROTHER OTHER: + STROKES FATHER  Social History: Last updated: 07/28/2006 Marital Status: MarriedLIVES WITH WIFE REMARRIED  Children: 3  / 1 AT HOME Occupation: [employer]    [employer]  Risk Factors: Smoking Status: never (07/28/2006)  Review of Systems      See HPI General:  Denies fever. GI:  Denies abdominal pain, bloody stools, and change in bowel habits. Neuro:  Complains of memory loss; denies falling down, headaches, inability to speak, sensation of room spinning, tingling, tremors, visual disturbances, and weakness.  Physical Exam  General:  alert and  well-developed, appears fatigued. Mouth:  Oral mucosa and oropharynx without lesions or exudates.  Teeth in good repair. No obvious gum bleeds. Lungs:  Normal respiratory effort, chest expands symmetrically. Lungs are clear to auscultation, no crackles or wheezes. Heart:  Normal rate and regular rhythm. S1 and S2 normal without gallop, murmur, click, rub or other extra sounds. Abdomen:  less distended, pos BS,  no tenderness, no guarding Msk:  Large echymosis on medial left fibula.  Full range of motions, ankle has normal stability. Extremities:  trace left pedal edema and trace right pedal edema.   Neurologic:  alert & oriented X3.   See MMSE  Skin:  no jaundice Psych:  normally interactive and good eye contact.     Impression & Recommendations:  Problem # 1:  ORTHOSTATIC DIZZINESS (ICD-780.4) Assessment New Orthostatics positive.  Likely multifactorial- malnourishment, dehydration, over medication (antihypertensives).  Will cut back to one Spironolactone 50 mg daily.  Follow up in 1 months, sooner if symptoms do not improve.  AGain discuss ETOH abuse. His updated medication list for this problem includes:    Fexofenadine Hcl 180 Mg Tabs (Fexofenadine hcl) .[name]... 1 tablet daily as needed by mouth  Orders: TLB-BMP (Basic Metabolic Panel-BMET) (80048-METABOL) TLB-CBC Platelet - w/Differential (85025-CBCD)  Problem # 2:  MEMORY LOSS (ICD-780.93) Assessment: New Likely an form of dementia related to ETOH abuse (see MMSE). Continue Vit B1 and B12 supplementation.  Will continue to follow MMSE, ?if he actually possible intoxicated today in the office which would alter the MMSE. CHeck labs- hepatic panel, BMET , ammonia(rule out hepatic encephalopathy, hyponatremia).  Problem # 3:  LEG PAIN, LEFT (ICD-729.5) Assessment: New Likely a contusion, multiple weeks since incidient, will montior (no imaging).  Complete Medication List: 1)  Fexofenadine Hcl 180 Mg Tabs (Fexofenadine hcl) .[name].. 1  tablet daily as needed by mouth 2)  Simvastatin 40 Mg Tabs (Simvastatin) .[name].. 1 tablet by mouth at bedtime 3)  Bayer Aspirin 325 Mg Tabs (Aspirin) .[name].. 1 tablet daily by mouth 4)  Cardura 8 Mg Tabs (Doxazosin mesylate) .[name].. 1 at bedtime by mouth 5)  Flexeril 10 Mg Tabs (Cyclobenzaprine hcl) .... 1/2 tab by mouth am and midafternoon, whole tab at night. 6)  Furosemide 40 Mg Tabs (Furosemide) .... Take one tablet daily 7)  Fluticasone Propionate 50 Mcg/act Susp (Fluticasone propionate) .... 2 sprays each nostril once daily 8)  Spironolactone 50 Mg Tabs (Spironolactone) .[name].. 1 tab by mouth daily  Other Orders: Venipuncture ([address]) TLB-Hepatic/Liver Function Pnl (80076-HEPATIC)  Patient Instructions: 1)  Please cut back to Spironolactone 50 mg once daily. 2)  Follow up with  me in one month.  Current Allergies (reviewed today): ! PROCARDIA   Geriatric Assessment:  Mental Status Exam: (value/max value)    Orientation to Time: 3/5    Orientation to Place: 5/5    Registration: 3/3    Attention/Calculation: 3/5    Recall: 2/3    Language-name 2 objects: 2/2    Language-repeat: 1/1    Language-follow 3-step command: 3/3    Language-read and follow direction: 1/1    Write a sentence: 1/1    Copy design: 1/1 MSE Total score: 25/30   Last Flu Vaccine:  Fluvax 3+ (10/17/2008 9:47:33 AM) Flu Vaccine Result Date:  10/09/2009 Flu Vaccine Result:  historical

## 2010-02-12 NOTE — Assessment & Plan Note (Signed)
Summary: FEET,ANKLE,LEGS SWOLLEN NOT FEELING WELL   Vital Signs:  Patient profile:   65 year old male Height:      69 inches Weight:      208.38 pounds BMI:     30.88 Temp:     98.6 degrees F oral Pulse rate:   88 / minute Pulse rhythm:   regular BP sitting:   130 / 74  (left arm) Cuff size:   regular  Vitals Entered By: Linde Gillis CMA Duncan Dull) (June 20, 2009 11:39 AM) CC: feet, ankles, and legs swollen   History of Present Illness: 65 yo male here for for swelling in legs and abdomen.  Started several weeks ago. bilateral lower extremities and abdomen started swelling at same time.  Becoming uncomfortable, short of breath because belly is so large. Painful when he bends over and presses against his abdomen. No fevers or chills. No nausea, vomiting, or diarrhea. No changes in stool or melena.  Drinking 5 L of wine a day, not interested in backing down. AST was elevated in October. Has gained 20 pounds since 10/2008!  Current Medications (verified): 1)  Fexofenadine Hcl 180 Mg Tabs (Fexofenadine Hcl) .[name].. 1 Tablet Daily As Needed By Mouth 2)  Lisinopril 10 Mg Tabs (Lisinopril) .... Take 1 Tablet By Mouth Once A Day 3)  Simvastatin 40 Mg  Tabs (Simvastatin) .[name].. 1 Tablet By Mouth At Bedtime 4)  Bayer Aspirin 325 Mg  Tabs (Aspirin) .[name].. 1 Tablet Daily By Mouth 5)  Cardura 8 Mg  Tabs (Doxazosin Mesylate) .[name].. 1 At Bedtime By Mouth 6)  Flexeril 10 Mg Tabs (Cyclobenzaprine Hcl) .... 1/2 Tab By Mouth Am and Midafternoon, Whole Tab At Night. 7)  Furosemide 40 Mg Tabs (Furosemide) .... Take One Tablet Daily X 5 Days 8)  Fluticasone Propionate 50 Mcg/act  Susp (Fluticasone Propionate) .... 2 Sprays Each Nostril Once Daily  Allergies: 1)  ! Procardia  Review of Systems      See HPI General:  Complains of malaise; denies chills and fever. Eyes:  Denies blurring. ENT:  Denies difficulty swallowing. CV:  Complains of swelling of feet and weight gain; denies difficulty breathing  while lying down and palpitations. Resp:  Complains of cough, shortness of breath, and sputum productive; denies wheezing. GI:  Complains of abdominal pain and loss of appetite; denies bloody stools, change in bowel habits, constipation, dark tarry stools, diarrhea, excessive appetite, gas, indigestion, nausea, and vomiting.  Physical Exam  General:  alert and well-developed, appears fatigued. Eyes:  vision grossly intact, pupils equal, and pupils round.   Mouth:  Oral mucosa and oropharynx without lesions or exudates.  Teeth in good repair. No obvious gum bleeds. Lungs:  Normal respiratory effort, chest expands symmetrically. Lungs are clear to auscultation, no crackles or wheezes. Heart:  Normal rate and regular rhythm. S1 and S2 normal without gallop, murmur, click, rub or other extra sounds. Abdomen:  distention, typanic, no tenderness, no guarding Extremities:  2+ left pedal edema and 2+ right pedal edema.   Neurologic:  alert & oriented X3.    Skin:  no jaundice Psych:  normally interactive and good eye contact.     Impression & Recommendations:  Problem # 1:  LEG EDEMA, BILATERAL (ICD-782.3) Assessment New Time spent with patient and wife > 45 minutes. This is very serious and needs hospital admission but he is refusing. LE edema with now ascites, most likely alcholic liver disease. Since pt refusing admission, will do our best to work up as  outpatient but told wife if resp status worsens or develops fever, needs to go ER immediately.  No physical exam findings consistent with SBP at this point. Will order 2 echo to evaluate heart funciton.  Ultrasound guided paracentesis today, evaulated fluid--culture, gram stain. CBC, TSH, BMET, LFTs today. Lasix 40 mg daily x 5 days. Follow up with [place] on Monday. His updated medication list for this problem includes:    Furosemide 40 Mg Tabs (Furosemide) .[name]... Take one tablet daily x 5 days  Orders: Venipuncture (91478) TLB-BMP (Basic  Metabolic Panel-BMET) (80048-METABOL) TLB-CBC Platelet - w/Differential (85025-CBCD) TLB-Hepatic/Liver Function Pnl (80076-HEPATIC) TLB-TSH (Thyroid Stimulating Hormone) (29562-ZHY) Cardiology Referral (Cardiology)  Complete Medication List: 1)  Fexofenadine Hcl 180 Mg Tabs (Fexofenadine hcl) .[name].. 1 tablet daily as needed by mouth 2)  Lisinopril 10 Mg Tabs (Lisinopril) .... Take 1 tablet by mouth once a day 3)  Simvastatin 40 Mg Tabs (Simvastatin) .[name].. 1 tablet by mouth at bedtime 4)  Bayer Aspirin 325 Mg Tabs (Aspirin) .[name].. 1 tablet daily by mouth 5)  Cardura 8 Mg Tabs (Doxazosin mesylate) .[name].. 1 at bedtime by mouth 6)  Flexeril 10 Mg Tabs (Cyclobenzaprine hcl) .... 1/2 tab by mouth am and midafternoon, whole tab at night. 7)  Furosemide 40 Mg Tabs (Furosemide) .... Take one tablet daily x 5 days 8)  Fluticasone Propionate 50 Mcg/act Susp (Fluticasone propionate) .... 2 sprays each nostril once daily  Other Orders: T-2 View CXR (71020TC) Radiology Referral (Radiology)  Patient Instructions: 1)  Please stop by to see [facility] on your way out. 2)  Please make appointment to see Dr. Hetty Ely or myself on Monday. 3)  MUST GO TO ER over the weekend if symptoms worsen. Prescriptions: FLUTICASONE PROPIONATE 50 MCG/ACT  SUSP (FLUTICASONE PROPIONATE) 2 sprays each nostril once daily  #1 vial x 3   Entered and Authorized by:   Ruthe Mannan MD   Signed by:   Ruthe Mannan MD on 06/20/2009   Method used:   [facility]        [facility] [facility])       [address]       [place], [place]  [address]       Ph: [phone] or [phone]       Fax: [phone]   RxID:   [id number] FUROSEMIDE 40 MG TABS (FUROSEMIDE) take one tablet daily x 5 days  #30 x 0   Entered and Authorized by:   Ruthe Mannan MD   Signed by:   Ruthe Mannan MD on 06/20/2009   Method used:   [facility]        [facility] [facility]retail)       [address]       [place],  [place]  [address]       Ph: [phone] or [phone]       Fax: [phone]   RxID:   [id number]   Current Allergies (reviewed today): ! PROCARDIA

## 2010-02-12 NOTE — Progress Notes (Signed)
  Phone Note From Other Clinic   Caller: Patient Placement , Selena Batten [phone] Call For: Dr. Dayton Martes Summary of Call: Patient is to go to Admitting.  Room # 5511, Bed 2.  Patient is aware. Initial call taken by: Delilah Shan CMA Duncan Dull),  June 30, 2009 8:47 AM     Appended Document:  Paper work faxed to admitting, [phone] per Selena Batten.

## 2010-02-12 NOTE — Progress Notes (Signed)
Summary: info from wife  Phone Note Call from Patient   Caller: Spouse Call For: Dr Dayton Martes Summary of Call: Pt has appt with you at 11:45 and his wife wants you to know that he hasnt felt well for awhile.  Ankles are swollen, coughing up brown mucous,  urine is dark, abd is bloated.  Pt drinks quite a lot, per his wife. Initial call taken by: Lowella Petties CMA,  June 20, 2009 9:47 AM

## 2010-02-12 NOTE — Miscellaneous (Signed)
Summary: med list update  Medications Added FEXOFENADINE HCL 180 MG TABS (FEXOFENADINE HCL) 1 tablet daily as needed by mouth       Clinical Lists Changes  Medications: Changed medication from FEXOFENADINE HCL 180 MG TABS (FEXOFENADINE HCL) 1 tablet as needed as needed by mouth to FEXOFENADINE HCL 180 MG TABS (FEXOFENADINE HCL) 1 tablet daily as needed by mouth     Prior Medications: FEXOFENADINE HCL 180 MG TABS (FEXOFENADINE HCL) 1 tablet daily as needed by mouth LISINOPRIL 10 MG TABS (LISINOPRIL) Take 1 tablet by mouth once a day SIMVASTATIN 40 MG  TABS (SIMVASTATIN) 1 tablet by mouth at bedtime BAYER ASPIRIN 325 MG  TABS (ASPIRIN) 1 tablet daily by mouth PROMETHAZINE HCL 25 MG  TABS (PROMETHAZINE HCL) one tab by mouth q 6 hrs as needed. CARDURA 8 MG  TABS (DOXAZOSIN MESYLATE) 1 AT BEDTIME BY MOUTH FLEXERIL 10 MG TABS (CYCLOBENZAPRINE HCL) 1/2 tab by mouth AM and midafternoon, whole tab at night. Current Allergies: ! PROCARDIA

## 2010-02-12 NOTE — Progress Notes (Signed)
Summary: refill request for alprazolam  Phone Note Refill Request Message from:  Fax from Pharmacy  Refills Requested: Medication #1:  ALPRAZOLAM 0.25 MG TABS 1 tab by mouth three times a day as needed anxiety.   Last Refilled: 10/31/2009 [facility] [address], [phone].  Initial call taken by: Lowella Petties CMA, AAMA,  January 06, 2010 11:55 AM  Follow-up for Phone Call        [facility] Follow-up by: Lowella Petties CMA, AAMA,  January 07, 2010 8:07 AM    Prescriptions: ALPRAZOLAM 0.25 MG TABS (ALPRAZOLAM) 1 tab by mouth three times a day as needed anxiety.  #90 x 0   Entered and Authorized by:   Ruthe Mannan MD   Signed by:   Ruthe Mannan MD on 01/07/2010   Method used:   Telephoned to ...       [facility]  [address] (retail)       [address]       [place], [place]  [address]       Ph: [phone] or [phone]       Fax: [phone]   RxID:   [id number]

## 2010-02-12 NOTE — Progress Notes (Signed)
Summary: wife requests phone call  Phone Note Call from Patient Call back at [phone].   Caller: Spouse Call For: Ruthe Mannan MD Summary of Call: Pt's wife is concerned that pt is acting strangely.  He is drinking quite a bit, along with xanax.  He is very depressed, she is concerned that pt will harm himself or someone else.  She is asking that you call her.  I advised her that you will not be able to give her any information, she says she just needs some guidance on how to proceed. Initial call taken by: Lowella Petties CMA, AAMA,  November 03, 2009 1:05 PM  Follow-up for Phone Call        Called pt's wife back.  Advised calling the police to involutarily commit patient as she feels he is a threat to himself and others.  She will call 911. Ruthe Mannan MD  November 03, 2009 1:42 PM

## 2010-02-12 NOTE — Assessment & Plan Note (Signed)
Summary: 30 MIN F/U HOSPITAL PER DR [name]/CLE   Vital Signs:  Patient profile:   65 year old male Height:      69 inches Weight:      179.13 pounds BMI:     26.55 Temp:     98.6 degrees F oral Pulse rate:   84 / minute Pulse rhythm:   regular BP sitting:   130 / 82  (left arm) Cuff size:   regular  Vitals Entered By: Linde Gillis CMA Duncan Dull) (July 25, 2009 11:38 AM) CC: hospital follow up   History of Present Illness: 64 yo male here for for follow up hospital admission for decompensated alcoholic liver cirhossis.    Hospital records reviewed. Admitted to [facility] 06/30/09/-07/02/09. An adittional 1.4 liters of fluid removed from abdomen via paracentesis. Hep A, B and C screening was negative. ACEI was d/c'ed in order to maximize his diuresis, now on Lasix 40 mg daily and Spironolactone 50 mg two times a day.  Was also continued on Vit B12 and Thiamine.  Since discharge, he is only drinking 1 glass of wine per day, per pt.  He has no interest in stopping completely, despite attempts by inpatient team to send him to rehab facility. Abdomen is less distended. Has lost 29 pounds since I last saw him on 06/20/09. He says he thinks that is from cutting back on wine. Appetite is ok. Denies confusion, nausea, vomiting, changes in his bowel, abdominal pain, or fevers.    Mr. [name] only complaint today is often, the Spironolactone causes muscle cramps 30 minutes after taking it.  he is also on KDur 40 meq daily.  Current Medications (verified): 1)  Fexofenadine Hcl 180 Mg Tabs (Fexofenadine Hcl) .[name].. 1 Tablet Daily As Needed By Mouth 2)  Simvastatin 40 Mg  Tabs (Simvastatin) .[name].. 1 Tablet By Mouth At Bedtime 3)  Bayer Aspirin 325 Mg  Tabs (Aspirin) .[name].. 1 Tablet Daily By Mouth 4)  Cardura 8 Mg  Tabs (Doxazosin Mesylate) .[name].. 1 At Bedtime By Mouth 5)  Flexeril 10 Mg Tabs (Cyclobenzaprine Hcl) .... 1/2 Tab By Mouth Am and Midafternoon, Whole Tab At Night. 6)  Furosemide 40 Mg Tabs  (Furosemide) .... Take One Tablet Daily 7)  Fluticasone Propionate 50 Mcg/act  Susp (Fluticasone Propionate) .... 2 Sprays Each Nostril Once Daily 8)  Spironolactone 50 Mg Tabs (Spironolactone) .[name].. 1 Tab By Mouth Bid  Allergies: 1)  ! Procardia  Past History:  Past Medical History: Last updated: 08-01-2006 Hyperlipidemia: 05/1997 Hypertension : 05/1997 Benign prostatic hypertrophy05/1999  Past Surgical History: Last updated: 04/03/2007 HOSP PNEUMONIA LEFT DUPUTRYEN CONTRACTIVE SURGERY NECK MRI C5-6 DISC ABNORMALITY (DR. KRITZER) :(06/2002) COLONOSCOPY MULTIPLE POLYPS B-9 (07/31/2004) REPEAT IN 3 YEARS EGD BX NEGATIVE METPASIA:(07/31/2004) EGD REFLUX ESOPHAGITIS H.H. GERD GASTRITIS: (07/31/2004) HOSP CP R/O'D 2 /24 - 2/25//2009 [facility]  NML EF 66% 03/09/2007  Family History: Last updated: August 01, 2006 Father: DECEASED 82YOA:CABG X 5/ MI, HTN, DM, CHF, STROKES Mother: DECEASED 68 YOA DEPRESSION / PAXIL/ NATURAL CAUSES Siblings: 2 BROTHERS 1 BROTHER CABG CONTINUES TO SMOKE 1 SISTER CV: + FATHER , MI HBP: + FATHER , + SELF DM: + FATHER GOUT/ARTHRITIS: NEGATIVE PROSTATE/CANCER: COLON CANCER: BREAST/OVARIAN/UTERINE CANCER: NEGATIVE DEPRESSION: + MOTHER ETOH/DRUG ABUSE: + BROTHER OTHER: + STROKES FATHER  Social History: Last updated: 01-Aug-2006 Marital Status: MarriedLIVES WITH WIFE REMARRIED  Children: 3  / 1 AT HOME Occupation: [employer]    [employer]  Risk Factors: Smoking Status: never (08-01-06)  Review of Systems  See HPI General:  Denies loss of appetite and malaise. Resp:  Denies chest pain with inspiration. GI:  Denies abdominal pain, bloody stools, and change in bowel habits. Neuro:  Denies memory loss.  Physical Exam  General:  alert and well-developed, appears fatigued. Abdomen:  less distended, pos BS,  no tenderness, no guarding Extremities:  no edema! Neurologic:  alert & oriented X3.    Psych:  normally interactive and good  eye contact.     Impression & Recommendations:  Problem # 1:  ALCOHOLIC CIRRHOSIS OF LIVER (ICD-571.2) Assessment Unchanged symptomatically improved and he has cut back drinking.  Still does not want to stop. Has lost a significant amount of weight, likely combination of aggressive diuresis and decreased calories from less consumption of alcohol.  Now that he is having muscle cramps, I am concerned about his potassium level (on potassium sparing diuretic and potassium supplementation).  Check BMET today. Orders: Venipuncture ([address]) TLB-BMP (Basic Metabolic Panel-BMET) (80048-METABOL)  Complete Medication List: 1)  Fexofenadine Hcl 180 Mg Tabs (Fexofenadine hcl) .[name].. 1 tablet daily as needed by mouth 2)  Simvastatin 40 Mg Tabs (Simvastatin) .[name].. 1 tablet by mouth at bedtime 3)  Bayer Aspirin 325 Mg Tabs (Aspirin) .[name].. 1 tablet daily by mouth 4)  Cardura 8 Mg Tabs (Doxazosin mesylate) .[name].. 1 at bedtime by mouth 5)  Flexeril 10 Mg Tabs (Cyclobenzaprine hcl) .... 1/2 tab by mouth am and midafternoon, whole tab at night. 6)  Furosemide 40 Mg Tabs (Furosemide) .... Take one tablet daily 7)  Fluticasone Propionate 50 Mcg/act Susp (Fluticasone propionate) .... 2 sprays each nostril once daily 8)  Spironolactone 50 Mg Tabs (Spironolactone) .[name].. 1 tab by mouth bid Prescriptions: FUROSEMIDE 40 MG TABS (FUROSEMIDE) take one tablet daily  #30 x 6   Entered and Authorized by:   Ruthe Mannan MD   Signed by:   Ruthe Mannan MD on 07/25/2009   Method used:   Electronically to        [facility] (retail)       [address]       [place], [place]  [address]       Ph: [phone] or [phone]       Fax: [phone]   RxID:   [id number]   Current Allergies (reviewed today): ! PROCARDIA

## 2010-02-12 NOTE — Progress Notes (Signed)
Summary: refill requests for mail order  Phone Note Refill Request Message from:  Patient  Refills Requested: Medication #1:  SIMVASTATIN 40 MG  TABS 1 tablet by mouth at bedtime  Medication #2:  CARDURA 8 MG  TABS 1 AT BEDTIME BY MOUTH   Dosage confirmed as above?Dosage Confirmed  Medication #3:  LISINOPRIL 10 MG TABS Take 1 tablet by mouth once a day  Medication #4:  FLEXERIL 10 MG TABS 1/2 tab by mouth AM and midafternoon Also allegra.  Fax forms for [facility] scripts are on your shelf.  Initial call taken by: Lowella Petties CMA,  May 07, 2009 2:54 PM  Follow-up for Phone Call        Scripts faxed, advised pt that he needs to schedule a get established visit with a new doctor within the next 3 months, per Dr. Hetty Ely. Follow-up by: Lowella Petties CMA,  May 07, 2009 3:55 PM    Prescriptions: FLEXERIL 10 MG TABS (CYCLOBENZAPRINE HCL) 1/2 tab by mouth AM and midafternoon, whole tab at night.  #180 x 0   Entered and Authorized by:   Shaune Leeks MD   Signed by:   Shaune Leeks MD on 05/07/2009   Method used:   Printed then faxed to ...       [facility] (retail)       [address]       [place], [place]  [address]       Ph: [phone] or [phone]       Fax: [phone]   RxID:   [id number] CARDURA 8 MG  TABS (DOXAZOSIN MESYLATE) 1 AT BEDTIME BY MOUTH  #90 x 0   Entered and Authorized by:   Shaune Leeks MD   Signed by:   Shaune Leeks MD on 05/07/2009   Method used:   Printed then faxed to ...       [facility] (retail)       [address]       [place], [place]  [address]       Ph: [phone] or [phone]       Fax: [phone]   RxID:   [id number] FEXOFENADINE HCL 180 MG TABS (FEXOFENADINE HCL) 1 tablet as needed as needed by mouth  #90 x 0   Entered and Authorized by:   Shaune Leeks MD   Signed by:   Shaune Leeks MD on 05/07/2009   Method used:   Printed then faxed  to ...       [facility]* (retail)       [address]       [place], [place]  [address]       Ph: [phone] or [phone]       Fax: [phone]   RxID:   [id number] SIMVASTATIN 40 MG  TABS (SIMVASTATIN) 1 tablet by mouth at bedtime  #90 x 0   Entered and Authorized by:   Shaune Leeks MD   Signed by:   Shaune Leeks MD on 05/07/2009   Method used:   Printed then faxed to ...       [facility] (retail)       [address]       [place], [place]  [address]       Ph: [phone] or [phone]       Fax: [phone]   RxID:   [id number] LISINOPRIL 10 MG TABS (LISINOPRIL)  Take 1 tablet by mouth once a day  #90 x 0   Entered and Authorized by:   Shaune Leeks MD   Signed by:   Shaune Leeks MD on 05/07/2009   Method used:   Printed then faxed to ...       [facility] (retail)       [address]       [place], [place]  [address]       Ph: [phone] or [phone]       Fax: [phone]   RxID:   [id number]  Scripts for one three month period. Needs to reestablish with a new doctor in that time. I haven't seen him since 2/10.

## 2010-02-12 NOTE — Progress Notes (Signed)
Summary: refill request for alprazolam  Phone Note Refill Request Message from:  Fax from Pharmacy  Refills Requested: Medication #1:  ALPRAZOLAM 0.25 MG TABS 1 tab by mouth three times a day as needed anxiety.   Last Refilled: 01/07/2010 Faxed request from [facility] [address], [phone].  Initial call taken by: Lowella Petties CMA, AAMA,  February 05, 2010 12:18 PM  Follow-up for Phone Call        Rx called to pharmacy Follow-up by: Linde Gillis CMA Duncan Dull),  February 05, 2010 1:33 PM    Prescriptions: ALPRAZOLAM 0.25 MG TABS (ALPRAZOLAM) 1 tab by mouth three times a day as needed anxiety.  #90 x 0   Entered and Authorized by:   Ruthe Mannan MD   Signed by:   Ruthe Mannan MD on 02/05/2010   Method used:   Telephoned to ...       [facility]  [address] (retail)       [address]       [place], [place]  [address]       Ph: [phone] or [phone]       Fax: [phone]   RxID:   [id number]

## 2010-02-12 NOTE — Progress Notes (Signed)
Summary: refill request for spironolactone  Phone Note Refill Request Message from:  Fax from Pharmacy  Refills Requested: Medication #1:  spironolactone 50 mg   Last Refilled: 07/02/2009 Faxed request from [facility] [address].  This is not on med list but is on hospital discharge.  Initial call taken by: Lowella Petties CMA,  July 16, 2009 2:09 PM    New/Updated Medications: FUROSEMIDE 40 MG TABS (FUROSEMIDE) take one tablet daily SPIRONOLACTONE 50 MG TABS (SPIRONOLACTONE) 1 tab by mouth bid Prescriptions: FUROSEMIDE 40 MG TABS (FUROSEMIDE) take one tablet daily  #30 x 0   Entered and Authorized by:   Ruthe Mannan MD   Signed by:   Ruthe Mannan MD on 07/17/2009   Method used:   Electronically to        [facility] (retail)       [address]       [place], [place]  [address]       Ph: [phone] or [phone]       Fax: [phone]   RxID:   [id number] SPIRONOLACTONE 50 MG TABS (SPIRONOLACTONE) 1 tab by mouth bid  #60 x 3   Entered and Authorized by:   Ruthe Mannan MD   Signed by:   Ruthe Mannan MD on 07/17/2009   Method used:   Electronically to        [facility]  [address] (retail)       [address]       [place], [place]  [address]       Ph: [phone] or [phone]       Fax: [phone]   RxID:   [id number]

## 2010-02-12 NOTE — Assessment & Plan Note (Signed)
Summary: MONDAY FOLLOW UP/[facility]   Vital Signs:  Patient profile:   65 year old male Height:      69 inches Weight:      202.50 pounds BMI:     30.01 Temp:     98.4 degrees F oral Pulse rate:   88 / minute Pulse rhythm:   regular BP sitting:   100 / 70  (left arm) Cuff size:   regular  Vitals Entered By: Linde Gillis CMA Duncan Dull) (June 23, 2009 9:18 AM) CC: follow up from Friday visit 06/20/2009   History of Present Illness: 65 yo male here for for follow up swelling in legs and abdomen.  Saw him on Friday, attempted to admit him to hospital then but he refused. Started several weeks ago. Bilateral lower extremities and abdomen started swelling at same time.  Becoming uncomfortable, short of breath because belly is so large. Painful when he bends over and presses against his abdomen. No fevers or chills. No nausea, vomiting, or diarrhea. No changes in stool or melena.  Drinking 5 L of wine a day, not interested in backing down.  Wanted work up as outpatient. Ordered labs, echo, paracentesis on Friday.  AST was elevated in October, was 88, now 120.  All other LFTs were normal in October, now Alk Phos is 186, albumin is 3.  Normocytic anemia, hyponatremia.  2.2 L of fluid removed via paracentesis, fluid studies still pending. Still awaiting echo.  Has gained 20 pounds since 10/2008, now down 6 pounds since Friday. Feels about the same. Placed him on short course of Lasix 40 mg daily, not getting much releif.  Current Medications (verified): 1)  Fexofenadine Hcl 180 Mg Tabs (Fexofenadine Hcl) .[name].. 1 Tablet Daily As Needed By Mouth 2)  Lisinopril 10 Mg Tabs (Lisinopril) .... Take 1 Tablet By Mouth Once A Day 3)  Simvastatin 40 Mg  Tabs (Simvastatin) .[name].. 1 Tablet By Mouth At Bedtime 4)  Bayer Aspirin 325 Mg  Tabs (Aspirin) .[name].. 1 Tablet Daily By Mouth 5)  Cardura 8 Mg  Tabs (Doxazosin Mesylate) .[name].. 1 At Bedtime By Mouth 6)  Flexeril 10 Mg Tabs (Cyclobenzaprine Hcl) .... 1/2  Tab By Mouth Am and Midafternoon, Whole Tab At Night. 7)  Furosemide 40 Mg Tabs (Furosemide) .... Take One Tablet Daily X 5 Days 8)  Fluticasone Propionate 50 Mcg/act  Susp (Fluticasone Propionate) .... 2 Sprays Each Nostril Once Daily  Allergies: 1)  ! Procardia  Review of Systems      See HPI General:  Denies loss of appetite. Resp:  Complains of shortness of breath; denies chest pain with inspiration, cough, sputum productive, and wheezing. GI:  Complains of abdominal pain; denies bloody stools, change in bowel habits, constipation, dark tarry stools, diarrhea, indigestion, loss of appetite, nausea, vomiting, vomiting blood, and yellowish skin color. Psych:  Complains of depression; denies anxiety, mental problems, panic attacks, sense of great danger, suicidal thoughts/plans, thoughts of violence, unusual visions or sounds, and thoughts /plans of harming others.  Physical Exam  General:  alert and well-developed, appears fatigued. Abdomen:  distention, typanic, no tenderness, no guarding Extremities:  2+ left pedal edema and 2+ right pedal edema.   Neurologic:  alert & oriented X3.    Skin:  no jaundice Psych:  normally interactive and good eye contact.     Impression & Recommendations:  Problem # 1:  ABDOMINAL DISTENSION (ICD-787.3) Assessment Unchanged Time spent with patient 60  minutes, more than 50% of this time was spent  counseling patient on why he needs admission to hospital.  He admits that he is an alcholic and understands that he will go into liver failure.  Removing fluid via paracentesis is not stopping the cause of the fulid accumulation which is likely due to alcholic cirrhosis.  Discussed with pt and wife at length, he absolutely refuses admission to hospital.  Does not want to stop drinking. I told him that he would be comfortable on an ativan protocal in hospital to help with withdrawl symptoms.   He does admit to some depression, does not want further treatment for  that either. Awaiting fluid studies, currently no signs or symptoms of SBP. CT of abdomen to look at liver in more detail.  Advised B vitamin supplementation. Pt understands all of his lab results and that refusing admission and proper treatment is going against medical advise.  Orders: Radiology Referral (Radiology)  Complete Medication List: 1)  Fexofenadine Hcl 180 Mg Tabs (Fexofenadine hcl) .[name].. 1 tablet daily as needed by mouth 2)  Lisinopril 10 Mg Tabs (Lisinopril) .... Take 1 tablet by mouth once a day 3)  Simvastatin 40 Mg Tabs (Simvastatin) .[name].. 1 tablet by mouth at bedtime 4)  Bayer Aspirin 325 Mg Tabs (Aspirin) .[name].. 1 tablet daily by mouth 5)  Cardura 8 Mg Tabs (Doxazosin mesylate) .[name].. 1 at bedtime by mouth 6)  Flexeril 10 Mg Tabs (Cyclobenzaprine hcl) .... 1/2 tab by mouth am and midafternoon, whole tab at night. 7)  Furosemide 40 Mg Tabs (Furosemide) .... Take one tablet daily x 5 days 8)  Fluticasone Propionate 50 Mcg/act Susp (Fluticasone propionate) .... 2 sprays each nostril once daily  Patient Instructions: 1)  Please stop by to see [name] on your way out to set up your abdominal CT scan.  Current Allergies (reviewed today): ! PROCARDIA

## 2010-02-12 NOTE — Progress Notes (Signed)
Summary: pt's wife called  Phone Note Call from Patient   Caller: Spouse Summary of Call: Pt has an appt today.  Wife wants you to know that pt has been feeling dizzy, has been feeling weak- muscles feel weak, has been very forgetful- forgot how to get home the other day.  Pt doesnt know that his wife is calling.  I advised her that you will not be able to bring these things up to the pt, he will have to.  She said she will try to come with him to appt. Initial call taken by: Lowella Petties CMA,  October 14, 2009 11:13 AM  Follow-up for Phone Call        Please ask them to come a few minutes early if possible so we have time discuss ?possible admission again. Ruthe Mannan MD  October 14, 2009 11:14 AM  Patient's spouse advised as instructed via telephone.  She says that Mr. Pertuit is at work but she will call him and see if her can be here earlier than 12:30.  She said that he may agree to come earlier and he may not.  Linde Gillis CMA Duncan Dull)  October 14, 2009 11:17 AM    Additional Follow-up for Phone Call Additional follow up Details #1::        That's ok, thank you. Ruthe Mannan MD  October 14, 2009 11:19 AM

## 2010-02-12 NOTE — Assessment & Plan Note (Signed)
Summary: HOSPITAL F/U [facility] DISCHARGES 12/29/09 DLO   Vital Signs:  Patient profile:   65 year old male Height:      69 inches Weight:      187.50 pounds BMI:     27.79 Temp:     98.6 degrees F oral Pulse rate:   107 / minute Pulse rhythm:   regular BP sitting:   140 / 80  (right arm) Cuff size:   regular  Vitals Entered By: Linde Gillis CMA Duncan Dull) (January 09, 2010 2:24 PM) CC: hospital follow up   History of Present Illness: 65 yo with h/o ETOH abuse and liver cirhossis here for hopsital follow up.  D/c summary reviewed. Admited to [facility] 12/26/2009- 12/29/2009 after seizing at work. Head CT and C spine unremarkable. No was found to be 111 with anion gap metabolic acidosis and admitted to ICU for further evaluation.  Abdominal ultrasound showed sludge.  Mr. Melroy has had no recurrent seizures.  He is not ready for rehab.  Still drinks a pint of wine on weekends.  No cramping in his legs, no LE weakness. Feels he is thinking a little clearer again.  Increased appetite.   Current Medications (verified): 1)  Fexofenadine Hcl 180 Mg Tabs (Fexofenadine Hcl) .[name].. 1 Tablet Daily As Needed By Mouth 2)  Simvastatin 40 Mg  Tabs (Simvastatin) .[name].. 1 Tablet By Mouth At Bedtime 3)  Bayer Aspirin 325 Mg  Tabs (Aspirin) .[name].. 1 Tablet Daily By Mouth 4)  Cardura 8 Mg  Tabs (Doxazosin Mesylate) .[name].. 1 At Bedtime By Mouth 5)  Flexeril 10 Mg Tabs (Cyclobenzaprine Hcl) .... 1/2 Tab By Mouth Am and Midafternoon, Whole Tab At Night. 6)  Furosemide 20 Mg Tabs (Furosemide) .... Take 1 Tab By Mouth Every Other Day 7)  Fluticasone Propionate 50 Mcg/act  Susp (Fluticasone Propionate) .... 2 Sprays Each Nostril Once Daily 8)  Spironolactone 50 Mg Tabs (Spironolactone) .[name].. 1 Tab By Mouth Daily 9)  Alprazolam 0.25 Mg Tabs (Alprazolam) .[name].. 1 Tab By Mouth Three Times A Day As Needed Anxiety. 10)  Cyclobenzaprine Hcl 10 Mg  Tabs (Cyclobenzaprine Hcl) .[name].. 1 By Mouth 2 Times Daily As Needed For Back  Pain 11)  Bactroban 2 % Crea (Mupirocin Calcium) .... Swab Nostrils Once Daily 12)  Vitamin B-1 100 Mg Tabs (Thiamine Hcl) .... Take One Tablet By Mouth Daily 13)  Vitamin B Complex  Caps (B Complex Vitamins) .... 800mg  By Mouth Daily  Allergies: 1)  ! Procardia  Past History:  Past Medical History: Last updated: 2006-08-21 Hyperlipidemia: 05/1997 Hypertension : 05/1997 Benign prostatic hypertrophy05/1999  Past Surgical History: Last updated: 04/03/2007 HOSP PNEUMONIA LEFT DUPUTRYEN CONTRACTIVE SURGERY NECK MRI C5-6 DISC ABNORMALITY (DR. KRITZER) :(06/2002) COLONOSCOPY MULTIPLE POLYPS B-9 (07/31/2004) REPEAT IN 3 YEARS EGD BX NEGATIVE METPASIA:(07/31/2004) EGD REFLUX ESOPHAGITIS H.H. GERD GASTRITIS: (07/31/2004) HOSP CP R/O'D 2 /24 - 2/25//2009 [facility]  NML EF 66% 03/09/2007  Family History: Last updated: 08-21-06 Father: DECEASED 64YOA:CABG X 5/ MI, HTN, DM, CHF, STROKES Mother: DECEASED 64 YOA DEPRESSION / PAXIL/ NATURAL CAUSES Siblings: 2 BROTHERS 1 BROTHER CABG CONTINUES TO SMOKE 1 SISTER CV: + FATHER , MI HBP: + FATHER , + SELF DM: + FATHER GOUT/ARTHRITIS: NEGATIVE PROSTATE/CANCER: COLON CANCER: BREAST/OVARIAN/UTERINE CANCER: NEGATIVE DEPRESSION: + MOTHER ETOH/DRUG ABUSE: + BROTHER OTHER: + STROKES FATHER  Social History: Last updated: 08-21-06 Marital Status: MarriedLIVES WITH WIFE REMARRIED  Children: 3  / 1 AT HOME 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 Eyes:  Denies blurring. ENT:  Denies difficulty swallowing. MS:  Denies joint pain, joint redness, and joint swelling. Neuro:  Denies headaches, tremors, visual disturbances, and weakness.  Physical Exam  General:  alert and well-developed, tearful but appropriate. Weight up 5 pounds Eyes:  vision grossly intact, pupils equal, and pupils round.   Mouth:  Oral mucosa and  oropharynx without lesions or exudates.  Teeth in good repair. No obvious gum bleeds. Lungs:  Normal respiratory effort, chest expands symmetrically. Lungs are clear to auscultation, no crackles or wheezes. Heart:  Normal rate and regular rhythm. S1 and S2 normal without gallop, murmur, click, rub or other extra sounds. Abdomen:  less distended, pos BS,  no tenderness, no guarding Msk:  Large echymosis on medial left fibula.  Full range of motions, ankle has normal stability. Extremities:  no edema Neurologic:  alert & oriented X3.   A little shaky, no asterixix Psych:  normally interactive and good eye contact.     Impression & Recommendations:  Problem # 1:  HYPOSMOLALITY AND/OR HYPONATREMIA (ICD-276.1) Assessment Deteriorated Continue current medications, at d/c Na was 120 which I anticipate will be around his baseline now. Recheck BMET today. Orders: TLB-BMP (Basic Metabolic Panel-BMET) (80048-METABOL)  Problem # 2:  ALCOHOL ABUSE (ICD-305.00) Assessment: Unchanged Still is not ready to quit.    Problem # 3:  ALCOHOLIC CIRRHOSIS OF LIVER (ICD-571.2) Assessment: Unchanged See above.  Continue Lasix and Aldactone.   Orders: Venipuncture (91478) TLB-CBC Platelet - w/Differential (85025-CBCD) TLB-Hepatic/Liver Function Pnl (80076-HEPATIC)  Complete Medication List: 1)  Fexofenadine Hcl 180 Mg Tabs (Fexofenadine hcl) .[name].. 1 tablet daily as needed by mouth 2)  Simvastatin 40 Mg Tabs (Simvastatin) .[name].. 1 tablet by mouth at bedtime 3)  Bayer Aspirin 325 Mg Tabs (Aspirin) .[name].. 1 tablet daily by mouth 4)  Cardura 8 Mg Tabs (Doxazosin mesylate) .[name].. 1 at bedtime by mouth 5)  Flexeril 10 Mg Tabs (Cyclobenzaprine hcl) .... 1/2 tab by mouth am and midafternoon, whole tab at night. 6)  Furosemide 20 Mg Tabs (Furosemide) .... Take 1 tab by mouth every other day 7)  Fluticasone Propionate 50 Mcg/act Susp (Fluticasone propionate) .... 2 sprays each nostril once daily 8)  Spironolactone 50  Mg Tabs (Spironolactone) .[name].. 1 tab by mouth daily 9)  Alprazolam 0.25 Mg Tabs (Alprazolam) .[name].. 1 tab by mouth three times a day as needed anxiety. 10)  Cyclobenzaprine Hcl 10 Mg Tabs (Cyclobenzaprine hcl) .[name].. 1 by mouth 2 times daily as needed for back pain 11)  Bactroban 2 % Crea (Mupirocin calcium) .... Swab nostrils once daily 12)  Vitamin B-1 100 Mg Tabs (Thiamine hcl) .... Take one tablet by mouth daily 13)  Vitamin B Complex Caps (B complex vitamins) .... 800mg  by mouth daily Prescriptions: SPIRONOLACTONE 50 MG TABS (SPIRONOLACTONE) 1 tab by mouth daily  #30 x 6   Entered and Authorized by:   Ruthe Mannan MD   Signed by:   Ruthe Mannan MD on 01/09/2010   Method used:   [facility] ([facility]ail)       [address]       [place], [place]  [address]       Ph: [phone] or [phone]       Fax: [phone]   RxID:   [id number] SIMVASTATIN 40 MG  TABS (SIMVASTATIN) 1  tablet by mouth at bedtime  #30 Tablet x 4   Entered and Authorized by:   Ruthe Mannan MD   Signed by:   Ruthe Mannan MD on 01/09/2010   Method used:   [facility]        [facility] (retail)       [address]       [place], [place]  [address]       Ph: [phone] or [phone]       Fax: [phone]   RxID:   [id number] FEXOFENADINE HCL 180 MG TABS (FEXOFENADINE HCL) 1 tablet daily as needed by mouth  #30 Tablet x 4   Entered and Authorized by:   Ruthe Mannan MD   Signed by:   Ruthe Mannan MD on 01/09/2010   Method used:   [facility]        [facility] (retail)       [address]       [place], [place]  [address]       Ph: [phone] or [phone]       Fax: [phone]   RxID:   [id number] FUROSEMIDE 20 MG TABS (FUROSEMIDE) Take 1 tab by mouth every other day  #30 x 3   Entered and Authorized by:   Ruthe Mannan MD   Signed by:   Ruthe Mannan MD on 01/09/2010   Method used:   [facility]        [facility] (retail)       [address]       [place], [place]  [address]       Ph: [phone] or [phone]       Fax: [phone]   RxID:   [id number] FLEXERIL 10 MG TABS (CYCLOBENZAPRINE HCL) 1/2 tab by mouth AM and midafternoon, whole tab at night.  #180 x 0   Entered and Authorized by:   Ruthe Mannan MD   Signed by:   Ruthe Mannan MD on 01/09/2010   Method used:   [facility]        [facility] (retail)       [address] [place] [address]       [place], [place]  [address]       Ph: [phone] or [phone]       Fax: [phone]   RxID:   [id number] CARDURA 8 MG  TABS (DOXAZOSIN MESYLATE) 1 AT BEDTIME BY MOUTH  #30 Tablet x 5   Entered and Authorized by:   Ruthe Mannan MD   Signed by:   Ruthe Mannan MD on 01/09/2010   Method used:   [facility]        [facility] ([facility]ail)       [address]       [place], [place]  [address]       Ph: [phone] or [phone]       Fax: [phone]   RxID:   [id number]    Orders Added: 1)  Venipuncture [34196] 2)  TLB-BMP (Basic Metabolic Panel-BMET) [80048-METABOL] 3)  TLB-CBC Platelet - w/Differential [85025-CBCD] 4)  TLB-Hepatic/Liver Function Pnl [80076-HEPATIC] 5)  Est. Patient Level IV [[address]]    Current Allergies (reviewed today): ! PROCARDIA

## 2010-02-12 NOTE — Progress Notes (Signed)
Summary: ? about fluid pill and Echo results  Phone Note Call from Patient Call back at Home Phone [phone] Call back at [phone] cell   Caller: Spouse Call For: Dr. Dayton Martes Summary of Call: Mrs. Wender called and wanted to know if patient is suppose to continue Furosemide 40mg , he was suppose to be on this medication for 5 days. Should he continue this or stop?  Also she wants to know the results of the Echo.  I advised her that the results are back but Dr. Dayton Martes had not reviewed them.  I will wait for Dr. Elmer Sow response in the morning and call her back and let her know.  Mrs. Blethen also wanted to know if there was anything she could do to alert the hospital, MD, about patient's mental state or status since he drinks.  She feels that he sometimes cannot make a good judgement about his care.  Please advise. Initial call taken by: Linde Gillis CMA Duncan Dull),  June 25, 2009 4:32 PM  Follow-up for Phone Call        Per Dr. Dayton Martes, I called patient's spouse and left a message on cell phone voicemail advising her that Dr. Dayton Martes will be giving her a call this morning around 10:45 or so to discuss Mr. Mangold Echo and other issuses.  Linde Gillis CMA Duncan Dull)  June 26, 2009 9:32 AM   Additional Follow-up for Phone Call Additional follow up Details #1::        Left voicemail asking them to call us back. Ruthe Mannan MD  June 26, 2009 10:07 AM     Additional Follow-up for Phone Call Additional follow up Details #2::    Talked with pt and his wife.  LE swelling getting worse.  I advised him to go to ER, he still refuses.  Agreed to may be allow me to admit him on MOnday.  Appt will be made for monday but I still urged him to go straight to ER. Ruthe Mannan MD  June 26, 2009 1:38 PM

## 2010-02-18 NOTE — Progress Notes (Signed)
Summary: Rx Flexeril  Phone Note Refill Request Call back at [phone] Message [facility]:  [facility] on February 09, 2010 11:01 AM  Refills Requested: Medication #1:  FLEXERIL 10 MG TABS 1/2 tab by mouth AM and midafternoon   Last Refilled: 01/09/2010 Received faxed refill request please advise.   Method Requested: Telephone to Pharmacy Initial call taken by: Linde Gillis CMA Duncan Dull),  February 09, 2010 11:02 AM    Prescriptions: FLEXERIL 10 MG TABS (CYCLOBENZAPRINE HCL) 1/2 tab by mouth AM and midafternoon, whole tab at night.  #180 x 0   Entered and Authorized by:   Ruthe Mannan MD   Signed by:   Ruthe Mannan MD on 02/09/2010   Method used:   [facility]        [facility] (retail)       [address]       [place], [place]  [address]       Ph: [phone] or [phone]       Fax: [phone]   RxID:   [id number]

## 2010-03-02 ENCOUNTER — Telehealth: Payer: Self-pay | Admitting: Family Medicine

## 2010-03-04 ENCOUNTER — Ambulatory Visit (INDEPENDENT_AMBULATORY_CARE_PROVIDER_SITE_OTHER): Admitting: Family Medicine

## 2010-03-04 ENCOUNTER — Other Ambulatory Visit: Payer: Self-pay | Admitting: Family Medicine

## 2010-03-04 ENCOUNTER — Telehealth: Payer: Self-pay | Admitting: Family Medicine

## 2010-03-04 ENCOUNTER — Encounter: Payer: Self-pay | Admitting: Family Medicine

## 2010-03-04 DIAGNOSIS — K7030 Alcoholic cirrhosis of liver without ascites: Secondary | ICD-10-CM

## 2010-03-04 DIAGNOSIS — R110 Nausea: Secondary | ICD-10-CM

## 2010-03-04 LAB — CBC WITH DIFFERENTIAL/PLATELET
Eosinophils Absolute: 0.1 10*3/uL (ref 0.0–0.7)
Eosinophils Relative: 0.7 % (ref 0.0–5.0)
Hemoglobin: 8.9 g/dL — ABNORMAL LOW (ref 13.0–17.0)
Lymphocytes Relative: 10.8 % — ABNORMAL LOW (ref 12.0–46.0)
Lymphs Abs: 1.1 10*3/uL (ref 0.7–4.0)
MCHC: 35 g/dL (ref 30.0–36.0)
Monocytes Absolute: 1.4 10*3/uL — ABNORMAL HIGH (ref 0.1–1.0)
Monocytes Relative: 13.6 % — ABNORMAL HIGH (ref 3.0–12.0)
Platelets: 169 10*3/uL (ref 150.0–400.0)
RBC: 2.73 Mil/uL — ABNORMAL LOW (ref 4.22–5.81)
RDW: 15.9 % — ABNORMAL HIGH (ref 11.5–14.6)

## 2010-03-04 LAB — BASIC METABOLIC PANEL
Calcium: 9.4 mg/dL (ref 8.4–10.5)
Creatinine, Ser: 1 mg/dL (ref 0.4–1.5)
GFR: 76.53 mL/min (ref >60.00)

## 2010-03-04 LAB — HEPATIC FUNCTION PANEL
AST: 29 U/L (ref 0–37)
Alkaline Phosphatase: 57 U/L (ref 39–117)
Bilirubin, Direct: 0.3 mg/dL (ref 0.0–0.3)
Total Protein: 7.1 g/dL (ref 6.0–8.3)

## 2010-03-09 ENCOUNTER — Encounter (INDEPENDENT_AMBULATORY_CARE_PROVIDER_SITE_OTHER): Payer: Self-pay | Admitting: *Deleted

## 2010-03-09 ENCOUNTER — Other Ambulatory Visit (INDEPENDENT_AMBULATORY_CARE_PROVIDER_SITE_OTHER)

## 2010-03-09 ENCOUNTER — Other Ambulatory Visit: Payer: Self-pay | Admitting: Family Medicine

## 2010-03-09 DIAGNOSIS — D539 Nutritional anemia, unspecified: Secondary | ICD-10-CM

## 2010-03-09 DIAGNOSIS — E871 Hypo-osmolality and hyponatremia: Secondary | ICD-10-CM

## 2010-03-09 LAB — CBC WITH DIFFERENTIAL/PLATELET
Basophils Absolute: 0.1 10*3/uL (ref 0.0–0.1)
Eosinophils Absolute: 0 10*3/uL (ref 0.0–0.7)
Hemoglobin: 9.4 g/dL — ABNORMAL LOW (ref 13.0–17.0)
Lymphocytes Relative: 12.6 % (ref 12.0–46.0)
MCHC: 34.3 g/dL (ref 30.0–36.0)
Neutro Abs: 7.5 10*3/uL (ref 1.4–7.7)
Neutrophils Relative %: 72.9 % (ref 43.0–77.0)
RDW: 18.7 % — ABNORMAL HIGH (ref 11.5–14.6)

## 2010-03-09 LAB — BASIC METABOLIC PANEL
CO2: 26 mEq/L (ref 19–32)
Calcium: 9.6 mg/dL (ref 8.4–10.5)
Creatinine, Ser: 1.2 mg/dL (ref 0.4–1.5)
Glucose, Bld: 108 mg/dL — ABNORMAL HIGH (ref 70–99)

## 2010-03-10 NOTE — Progress Notes (Signed)
Summary: Rx Flexeril  Phone Note Refill Request Call back at [phone] Message from:  [facility] on March 04, 2010 9:47 AM  Refills Requested: Medication #1:  FLEXERIL 10 MG TABS 1/2 tab by mouth AM and midafternoon   Last Refilled: 01/09/2010 Received E-script request please advise.   Method Requested: Telephone to Pharmacy Initial call taken by: Linde Gillis CMA Duncan Dull),  March 04, 2010 9:47 AM    Prescriptions: FLEXERIL 10 MG TABS (CYCLOBENZAPRINE HCL) 1/2 tab by mouth AM and midafternoon, whole tab at night.  #180 x 0   Entered and Authorized by:   Ruthe Mannan MD   Signed by:   Ruthe Mannan MD on 03/04/2010   Method used:   [facility]        [facility] (retail)       [address]       [place], [place]  [address]       Ph: [phone] or [phone]       Fax: [phone]   RxID:   [id number]

## 2010-03-10 NOTE — Assessment & Plan Note (Signed)
Summary: NOT FEELING WELL/CLE   [facility]   Vital Signs:  Patient profile:   65 year old male Height:      69 inches Weight:      197.50 pounds BMI:     29.27 Temp:     98.4 degrees F oral Pulse rate:   72 / minute Pulse rhythm:   regular BP sitting:   120 / 80  (right arm) Cuff size:   regular  Vitals Entered By: Linde Gillis CMA Duncan Dull) (March 04, 2010 8:34 AM) CC: nausea, knee pain   History of Present Illness: 65 yo with h/o ETOH abuse and liver cirhossis here for nausea, knee and back pain.  Back to drinking 1 L of wine a day. Goes days without eating because he is either passed out or not hungry. Does have nausea, no vomiting.  Knee hurts because he fell off the toilet last week and landed on his knee. Does not think he had a seizure. Did not have LOC . Did not hit his head. He thinks he just fell off because he was intoxicate.  Wife is very concerned about him.  H/o a h/o hyponatremia induced seizures and ETOH liver cirhossis.  Current Medications (verified): 1)  Fexofenadine Hcl 180 Mg Tabs (Fexofenadine Hcl) .[name].. 1 Tablet Daily As Needed By Mouth 2)  Simvastatin 40 Mg  Tabs (Simvastatin) .[name].. 1 Tablet By Mouth At Bedtime 3)  Bayer Aspirin 325 Mg  Tabs (Aspirin) .[name].. 1 Tablet Daily By Mouth 4)  Cardura 8 Mg  Tabs (Doxazosin Mesylate) .[name].. 1 At Bedtime By Mouth 5)  Flexeril 10 Mg Tabs (Cyclobenzaprine Hcl) .... 1/2 Tab By Mouth Am and Midafternoon, Whole Tab At Night. 6)  Furosemide 20 Mg Tabs (Furosemide) .... Take 1 Tab By Mouth Every Other Day 7)  Fluticasone Propionate 50 Mcg/act  Susp (Fluticasone Propionate) .... 2 Sprays Each Nostril Once Daily 8)  Spironolactone 50 Mg Tabs (Spironolactone) .[name].. 1 Tab By Mouth Daily 9)  Alprazolam 0.25 Mg Tabs (Alprazolam) .[name].. 1 Tab By Mouth Three Times A Day As Needed Anxiety. 10)  Cyclobenzaprine Hcl 10 Mg  Tabs (Cyclobenzaprine Hcl) .[name].. 1 By Mouth 2 Times Daily As Needed For Back Pain 11)  Bactroban 2 % Crea (Mupirocin  Calcium) .... Swab Nostrils Once Daily 12)  Vitamin B-1 100 Mg Tabs (Thiamine Hcl) .... Take One Tablet By Mouth Daily 13)  Vitamin B Complex  Caps (B Complex Vitamins) .... 800mg  By Mouth Daily 14)  Promethazine Hcl 25 Mg  Tabs (Promethazine Hcl) .[name].. 1 Tab By Mouth Every 6 Hours As Needed For Nausea  Allergies: 1)  ! Procardia  Past History:  Past Medical History: Last updated: 08/17/06 Hyperlipidemia: 05/1997 Hypertension : 05/1997 Benign prostatic hypertrophy05/1999  Past Surgical History: Last updated: 04/03/2007 HOSP PNEUMONIA LEFT DUPUTRYEN CONTRACTIVE SURGERY NECK MRI C5-6 DISC ABNORMALITY (DR. KRITZER) :(06/2002) COLONOSCOPY MULTIPLE POLYPS B-9 (07/31/2004) REPEAT IN 3 YEARS EGD BX NEGATIVE METPASIA:(07/31/2004) EGD REFLUX ESOPHAGITIS H.H. GERD GASTRITIS: (07/31/2004) HOSP CP R/O'D 2 /24 - 2/25//2009 [facility]  NML EF 66% 03/09/2007  Family History: Last updated: 08-17-2006 Father: DECEASED 82YOA:CABG X 5/ MI, HTN, DM, CHF, STROKES Mother: DECEASED 31 YOA DEPRESSION / PAXIL/ NATURAL CAUSES Siblings: 2 BROTHERS 1 BROTHER CABG CONTINUES TO SMOKE 1 SISTER CV: + FATHER , MI HBP: + FATHER , + SELF DM: + FATHER GOUT/ARTHRITIS: NEGATIVE PROSTATE/CANCER: COLON CANCER: BREAST/OVARIAN/UTERINE CANCER: NEGATIVE DEPRESSION: + MOTHER ETOH/DRUG ABUSE: + BROTHER OTHER: + STROKES FATHER  Social History: Last updated: 2006-08-17  Marital Status: MarriedLIVES WITH WIFE REMARRIED  Children: 3  / 1 AT HOME Occupation: [employer]    [employer]  Risk Factors: Smoking Status: never (07/28/2006)  Review of Systems      See HPI General:  Denies fever. CV:  Denies chest pain or discomfort. GI:  Complains of loss of appetite and nausea; denies abdominal pain, change in bowel habits, and vomiting.  Physical Exam  General:  alert and well-developed, tearful but appropriate. Weight up 10 pounds. VSS smells of ETOH, disheveled  Lungs:  Normal respiratory effort,  chest expands symmetrically. Lungs are clear to auscultation, no crackles or wheezes. Heart:  Normal rate and regular rhythm. S1 and S2 normal without gallop, murmur, click, rub or other extra sounds. Abdomen:  some distension, pos BS,  no tenderness, no guarding Msk:  Large echymosis under left patella,  no effusionFull range of motions, ankle has normal stability. Neurologic:  alert & oriented X3.   A little shaky, no asterixix Psych:  normally interactive and good eye contact.     Impression & Recommendations:  Problem # 1:  NAUSEA (ICD-787.02) Assessment Deteriorated This is likely multifactorial but related to his alcohol abuse. I am concerned that he is hyponatremic again. Will recheck BMET, Liver function. Pt tells me is does not want to quit drinking, will not go to rehab. His updated medication list for this problem includes:    Fexofenadine Hcl 180 Mg Tabs (Fexofenadine hcl) .[name]... 1 tablet daily as needed by mouth    Promethazine Hcl 25 Mg Tabs (Promethazine hcl) .[name]... 1 tab by mouth every 6 hours as needed for nausea  Complete Medication List: 1)  Fexofenadine Hcl 180 Mg Tabs (Fexofenadine hcl) .[name].. 1 tablet daily as needed by mouth 2)  Simvastatin 40 Mg Tabs (Simvastatin) .[name].. 1 tablet by mouth at bedtime 3)  Bayer Aspirin 325 Mg Tabs (Aspirin) .[name].. 1 tablet daily by mouth 4)  Cardura 8 Mg Tabs (Doxazosin mesylate) .[name].. 1 at bedtime by mouth 5)  Flexeril 10 Mg Tabs (Cyclobenzaprine hcl) .... 1/2 tab by mouth am and midafternoon, whole tab at night. 6)  Furosemide 20 Mg Tabs (Furosemide) .... Take 1 tab by mouth every other day 7)  Fluticasone Propionate 50 Mcg/act Susp (Fluticasone propionate) .... 2 sprays each nostril once daily 8)  Spironolactone 50 Mg Tabs (Spironolactone) .[name].. 1 tab by mouth daily 9)  Alprazolam 0.25 Mg Tabs (Alprazolam) .[name].. 1 tab by mouth three times a day as needed anxiety. 10)  Cyclobenzaprine Hcl 10 Mg Tabs (Cyclobenzaprine hcl) .[name].. 1 by mouth 2  times daily as needed for back pain 11)  Bactroban 2 % Crea (Mupirocin calcium) .... Swab nostrils once daily 12)  Vitamin B-1 100 Mg Tabs (Thiamine hcl) .... Take one tablet by mouth daily 13)  Vitamin B Complex Caps (B complex vitamins) .... 800mg  by mouth daily 14)  Promethazine Hcl 25 Mg Tabs (Promethazine hcl) .[name].. 1 tab by mouth every 6 hours as needed for nausea  Other Orders: Venipuncture (40981) TLB-BMP (Basic Metabolic Panel-BMET) (80048-METABOL) TLB-Hepatic/Liver Function Pnl (80076-HEPATIC) TLB-CBC Platelet - w/Differential (85025-CBCD) TLB-B12 + Folate Pnl ([id number]-O13/YQM)   Orders Added: 1)  Venipuncture [36415] 2)  TLB-BMP (Basic Metabolic Panel-BMET) [80048-METABOL] 3)  TLB-Hepatic/Liver Function Pnl [80076-HEPATIC] 4)  TLB-CBC Platelet - w/Differential [85025-CBCD] 5)  TLB-B12 + Folate Pnl [[id number]-B12/FOL] 6)  Est. Patient Level IV [57846]    Current Allergies (reviewed today): ! PROCARDIA

## 2010-03-10 NOTE — Letter (Signed)
Summary: Out of Work  [facility]  [address] [place], [place] [address]   Phone: [phone]  Fax: [phone]    March 04, 2010   Employee:  NIKKOLAS COOMES    To Whom It May Concern:   For Medical reasons, please excuse the above named employee from work for the following dates:  Start:   03/04/2010  End:   03/09/2010  If you need additional information, please feel free to contact our office.         Sincerely,    Ruthe Mannan MD

## 2010-03-10 NOTE — Progress Notes (Signed)
Summary: refill request for alprazolam  Phone Note Refill Request Message from:  Fax from Pharmacy  Refills Requested: Medication #1:  ALPRAZOLAM 0.25 MG TABS 1 tab by mouth three times a day as needed anxiety.   Last Refilled: 02/05/2010 [facility] [address], [phone].  Initial call taken by: Lowella Petties CMA, AAMA,  March 04, 2010 10:55 AM  Follow-up for Phone Call        denied.  we cannot continue this now as he is drinking large quantities of alcohol again. Ruthe Mannan MD  March 04, 2010 11:07 AM  Advised pharmacy as instructed via message left on voicemail.  Follow-up by: Linde Gillis CMA Duncan Dull),  March 04, 2010 11:27 AM

## 2010-03-10 NOTE — Progress Notes (Signed)
Summary: ? Promethazine Rx  Phone Note From Pharmacy Call back at [phone]   [facility] Call For: Dr. Dayton Martes  Summary of Call: Received E-script request for Promethazine 25mg .  This medication was removed from medication list on 06/2009.  Left message on home machine for patient to return call to see if he still needs this medication.  Initial call taken by: Linde Gillis CMA Duncan Dull),  March 02, 2010 10:52 AM  Follow-up for Phone Call        Spoke with patient and he states that he does still need this medication.  Please advise.  Linde Gillis CMA Duncan Dull)  March 03, 2010 8:59 AM   Additional Follow-up for Phone Call Additional follow up Details #1::        why does he still need this?  It is for nausea and he should not continue to be this nauseated.  I will refill once but needs to be seen for more refills. Ruthe Mannan MD  March 03, 2010 9:02 AM  Spoke with patient and he states that he still does feel nauseated and needs this medication.  He has an appt scheduled for tomorrow that his wife made him.  Rx called to pharmacy.  Linde Gillis CMA Duncan Dull)  March 03, 2010 9:23 AM     New/Updated Medications: PROMETHAZINE HCL 25 MG  TABS (PROMETHAZINE HCL) 1 tab by mouth every 6 hours as needed for nausea Prescriptions: PROMETHAZINE HCL 25 MG  TABS (PROMETHAZINE HCL) 1 tab by mouth every 6 hours as needed for nausea  #20 x 0   Entered and Authorized by:   Ruthe Mannan MD   Signed by:   Ruthe Mannan MD on 03/03/2010   Method used:   [facility]ron[facility]        [facility] (retail)       [address]       [place], [place]  [address]       Ph: [phone] or [phone]       Fax: [phone]   RxID:   [id number]

## 2010-03-13 ENCOUNTER — Other Ambulatory Visit: Payer: Self-pay | Admitting: Family Medicine

## 2010-03-13 ENCOUNTER — Other Ambulatory Visit (INDEPENDENT_AMBULATORY_CARE_PROVIDER_SITE_OTHER)

## 2010-03-13 ENCOUNTER — Encounter (INDEPENDENT_AMBULATORY_CARE_PROVIDER_SITE_OTHER): Payer: Self-pay | Admitting: *Deleted

## 2010-03-13 DIAGNOSIS — E876 Hypokalemia: Secondary | ICD-10-CM

## 2010-03-13 DIAGNOSIS — D539 Nutritional anemia, unspecified: Secondary | ICD-10-CM

## 2010-03-13 DIAGNOSIS — R7309 Other abnormal glucose: Secondary | ICD-10-CM

## 2010-03-13 LAB — CBC WITH DIFFERENTIAL/PLATELET
Eosinophils Relative: 0.7 % (ref 0.0–5.0)
HCT: 29 % — ABNORMAL LOW (ref 39.0–52.0)
Lymphocytes Relative: 12.6 % (ref 12.0–46.0)
Lymphs Abs: 1 10*3/uL (ref 0.7–4.0)
Monocytes Relative: 10.2 % (ref 3.0–12.0)
Neutrophils Relative %: 75.7 % (ref 43.0–77.0)
Platelets: 275 10*3/uL (ref 150.0–400.0)
WBC: 7.9 10*3/uL (ref 4.5–10.5)

## 2010-03-13 LAB — BASIC METABOLIC PANEL
BUN: 8 mg/dL (ref 6–23)
Calcium: 9.5 mg/dL (ref 8.4–10.5)
GFR: 70.98 mL/min (ref >60.00)
Potassium: 5.4 mEq/L — ABNORMAL HIGH (ref 3.5–5.1)

## 2010-03-16 ENCOUNTER — Encounter (INDEPENDENT_AMBULATORY_CARE_PROVIDER_SITE_OTHER): Payer: Self-pay | Admitting: *Deleted

## 2010-03-16 ENCOUNTER — Other Ambulatory Visit (INDEPENDENT_AMBULATORY_CARE_PROVIDER_SITE_OTHER)

## 2010-03-16 ENCOUNTER — Other Ambulatory Visit: Payer: Self-pay | Admitting: Family Medicine

## 2010-03-16 DIAGNOSIS — E871 Hypo-osmolality and hyponatremia: Secondary | ICD-10-CM

## 2010-03-16 LAB — BASIC METABOLIC PANEL
CO2: 26 mEq/L (ref 19–32)
Chloride: 92 mEq/L — ABNORMAL LOW (ref 96–112)
Glucose, Bld: 119 mg/dL — ABNORMAL HIGH (ref 70–99)
Potassium: 4.4 mEq/L (ref 3.5–5.1)
Sodium: 128 mEq/L — ABNORMAL LOW (ref 135–145)

## 2010-03-23 LAB — BASIC METABOLIC PANEL
BUN: 10 mg/dL (ref 6–23)
BUN: 14 mg/dL (ref 6–23)
BUN: 17 mg/dL (ref 6–23)
BUN: 19 mg/dL (ref 6–23)
CO2: 20 mEq/L (ref 19–32)
CO2: 20 mEq/L (ref 19–32)
CO2: 20 mEq/L (ref 19–32)
CO2: 22 mEq/L (ref 19–32)
Calcium: 8.6 mg/dL (ref 8.4–10.5)
Calcium: 8.7 mg/dL (ref 8.4–10.5)
Calcium: 8.8 mg/dL (ref 8.4–10.5)
Chloride: 81 mEq/L — ABNORMAL LOW (ref 96–112)
Chloride: 83 mEq/L — ABNORMAL LOW (ref 96–112)
Chloride: 89 mEq/L — ABNORMAL LOW (ref 96–112)
Chloride: 90 mEq/L — ABNORMAL LOW (ref 96–112)
Chloride: 92 mEq/L — ABNORMAL LOW (ref 96–112)
Creatinine, Ser: 1.07 mg/dL (ref 0.4–1.5)
Creatinine, Ser: 1.07 mg/dL (ref 0.4–1.5)
Creatinine, Ser: 1.3 mg/dL (ref 0.4–1.5)
GFR calc Af Amer: >60 mL/min (ref >60)
GFR calc Af Amer: >60 mL/min (ref >60)
GFR calc Af Amer: >60 mL/min (ref >60)
GFR calc Af Amer: >60 mL/min (ref >60)
GFR calc non Af Amer: >60 mL/min (ref >60)
GFR calc non Af Amer: >60 mL/min (ref >60)
Glucose, Bld: 134 mg/dL — ABNORMAL HIGH (ref 70–99)
Glucose, Bld: 135 mg/dL — ABNORMAL HIGH (ref 70–99)
Glucose, Bld: 142 mg/dL — ABNORMAL HIGH (ref 70–99)
Glucose, Bld: 173 mg/dL — ABNORMAL HIGH (ref 70–99)
Potassium: 4.7 mEq/L (ref 3.5–5.1)
Potassium: 4.9 mEq/L (ref 3.5–5.1)
Sodium: 111 mEq/L — CL (ref 135–145)
Sodium: 118 mEq/L — CL (ref 135–145)
Sodium: 121 mEq/L — ABNORMAL LOW (ref 135–145)

## 2010-03-23 LAB — COMPREHENSIVE METABOLIC PANEL
AST: 56 U/L — ABNORMAL HIGH (ref 0–37)
Albumin: 3.4 g/dL — ABNORMAL LOW (ref 3.5–5.2)
Albumin: 3.8 g/dL (ref 3.5–5.2)
Alkaline Phosphatase: 75 U/L (ref 39–117)
BUN: 20 mg/dL (ref 6–23)
CO2: 21 mEq/L (ref 19–32)
Calcium: 8.6 mg/dL (ref 8.4–10.5)
Chloride: 89 mEq/L — ABNORMAL LOW (ref 96–112)
Creatinine, Ser: 1 mg/dL (ref 0.4–1.5)
Creatinine, Ser: 1.02 mg/dL (ref 0.4–1.5)
GFR calc Af Amer: >60 mL/min (ref >60)
GFR calc non Af Amer: >60 mL/min (ref >60)
Glucose, Bld: 124 mg/dL — ABNORMAL HIGH (ref 70–99)
Potassium: 4.5 mEq/L (ref 3.5–5.1)
Total Bilirubin: 1.5 mg/dL — ABNORMAL HIGH (ref 0.3–1.2)
Total Protein: 6.5 g/dL (ref 6.0–8.3)

## 2010-03-23 LAB — SODIUM, URINE, RANDOM: Sodium, Ur: 27 mEq/L

## 2010-03-23 LAB — CBC
Hemoglobin: 12.5 g/dL — ABNORMAL LOW (ref 13.0–17.0)
MCH: 32.8 pg (ref 26.0–34.0)
MCH: 33.8 pg (ref 26.0–34.0)
MCHC: 35.1 g/dL (ref 30.0–36.0)
MCV: 91.4 fL (ref 78.0–100.0)
MCV: 93.4 fL (ref 78.0–100.0)
Platelets: 118 10*3/uL — ABNORMAL LOW (ref 150–400)
Platelets: 157 10*3/uL (ref 150–400)
RBC: 3.35 MIL/uL — ABNORMAL LOW (ref 4.22–5.81)
RBC: 3.58 MIL/uL — ABNORMAL LOW (ref 4.22–5.81)
RBC: 3.7 MIL/uL — ABNORMAL LOW (ref 4.22–5.81)
RDW: 12.9 % (ref 11.5–15.5)
WBC: 11.4 10*3/uL — ABNORMAL HIGH (ref 4.0–10.5)
WBC: 13.4 10*3/uL — ABNORMAL HIGH (ref 4.0–10.5)

## 2010-03-23 LAB — DIFFERENTIAL
Basophils Absolute: 0 10*3/uL (ref 0.0–0.1)
Basophils Relative: 0 % (ref 0–1)
Eosinophils Absolute: 0 10*3/uL (ref 0.0–0.7)
Eosinophils Absolute: 0 10*3/uL (ref 0.0–0.7)
Eosinophils Relative: 0 % (ref 0–5)
Eosinophils Relative: 0 % (ref 0–5)
Lymphocytes Relative: 5 % — ABNORMAL LOW (ref 12–46)
Monocytes Absolute: 0.8 10*3/uL (ref 0.1–1.0)
Monocytes Absolute: 1.2 10*3/uL — ABNORMAL HIGH (ref 0.1–1.0)
Monocytes Relative: 6 % (ref 3–12)
Neutro Abs: 12.1 10*3/uL — ABNORMAL HIGH (ref 1.7–7.7)
Neutrophils Relative %: 90 % — ABNORMAL HIGH (ref 43–77)
nRBC: 0 /100 WBC

## 2010-03-23 LAB — PROTIME-INR
INR: 1.2 (ref 0.00–1.49)
Prothrombin Time: 15.4 seconds — ABNORMAL HIGH (ref 11.6–15.2)

## 2010-03-23 LAB — RAPID URINE DRUG SCREEN, HOSP PERFORMED
Amphetamines: NOT DETECTED
Opiates: NOT DETECTED
Tetrahydrocannabinol: NOT DETECTED

## 2010-03-23 LAB — POCT I-STAT 3, ART BLOOD GAS (G3+)
Acid-base deficit: 5 mmol/L — ABNORMAL HIGH (ref 0.0–2.0)
Bicarbonate: 17.6 mEq/L — ABNORMAL LOW (ref 20.0–24.0)
O2 Saturation: 96 %
TCO2: 18 mmol/L (ref 0–100)
pO2, Arterial: 73 mmHg — ABNORMAL LOW (ref 80.0–100.0)

## 2010-03-23 LAB — GLUCOSE, CAPILLARY: Glucose-Capillary: 120 mg/dL — ABNORMAL HIGH (ref 70–99)

## 2010-03-23 LAB — URINALYSIS, ROUTINE W REFLEX MICROSCOPIC
Bilirubin Urine: NEGATIVE
Ketones, ur: 15 mg/dL — AB
Nitrite: NEGATIVE
pH: 7 (ref 5.0–8.0)

## 2010-03-23 LAB — HEPATIC FUNCTION PANEL
Albumin: 3.9 g/dL (ref 3.5–5.2)
Alkaline Phosphatase: 77 U/L (ref 39–117)
Indirect Bilirubin: 1 mg/dL — ABNORMAL HIGH (ref 0.3–0.9)
Total Bilirubin: 1.5 mg/dL — ABNORMAL HIGH (ref 0.3–1.2)

## 2010-03-23 LAB — LIPID PANEL
LDL Cholesterol: 61 mg/dL (ref 0–99)
Total CHOL/HDL Ratio: 1.8 RATIO
Triglycerides: 31 mg/dL (ref <150)
VLDL: 6 mg/dL (ref 0–40)

## 2010-03-23 LAB — LACTIC ACID, PLASMA: Lactic Acid, Venous: 1.1 mmol/L (ref 0.5–2.2)

## 2010-03-23 LAB — FERRITIN: Ferritin: 645 ng/mL — ABNORMAL HIGH (ref 22–322)

## 2010-03-23 LAB — OSMOLALITY, URINE: Osmolality, Ur: 127 mOsm/kg — ABNORMAL LOW (ref 390–1090)

## 2010-03-23 LAB — ETHANOL: Alcohol, Ethyl (B): <5 mg/dL (ref 0–10)

## 2010-03-23 LAB — URINE CULTURE: Culture  Setup Time: 201112162003

## 2010-03-23 LAB — CORTISOL: Cortisol, Plasma: 19.7 ug/dL

## 2010-03-29 LAB — BASIC METABOLIC PANEL
BUN: 4 mg/dL — ABNORMAL LOW (ref 6–23)
BUN: 5 mg/dL — ABNORMAL LOW (ref 6–23)
CO2: 28 mEq/L (ref 19–32)
CO2: 30 mEq/L (ref 19–32)
Calcium: 8.1 mg/dL — ABNORMAL LOW (ref 8.4–10.5)
Calcium: 8.5 mg/dL (ref 8.4–10.5)
Chloride: 93 mEq/L — ABNORMAL LOW (ref 96–112)
Creatinine, Ser: 0.89 mg/dL (ref 0.4–1.5)
Creatinine, Ser: 0.94 mg/dL (ref 0.4–1.5)
GFR calc Af Amer: >60 mL/min (ref >60)
GFR calc non Af Amer: >60 mL/min (ref >60)
Glucose, Bld: 125 mg/dL — ABNORMAL HIGH (ref 70–99)
Glucose, Bld: 97 mg/dL (ref 70–99)

## 2010-03-29 LAB — BODY FLUID CELL COUNT WITH DIFFERENTIAL
Lymphs, Fluid: 14 %
Neutrophil Count, Fluid: 4 % (ref 0–25)
Total Nucleated Cell Count, Fluid: 270 cu mm (ref 0–1000)

## 2010-03-29 LAB — COMPREHENSIVE METABOLIC PANEL
ALT: 42 U/L (ref 0–53)
AST: 130 U/L — ABNORMAL HIGH (ref 0–37)
Albumin: 2.8 g/dL — ABNORMAL LOW (ref 3.5–5.2)
CO2: 30 mEq/L (ref 19–32)
Calcium: 7.7 mg/dL — ABNORMAL LOW (ref 8.4–10.5)
Chloride: 82 mEq/L — ABNORMAL LOW (ref 96–112)
Creatinine, Ser: 1.25 mg/dL (ref 0.4–1.5)
GFR calc Af Amer: >60 mL/min (ref >60)
GFR calc non Af Amer: 59 mL/min — ABNORMAL LOW (ref >60)
Sodium: 124 mEq/L — ABNORMAL LOW (ref 135–145)
Total Bilirubin: 1.4 mg/dL — ABNORMAL HIGH (ref 0.3–1.2)

## 2010-03-29 LAB — CBC
Hemoglobin: 11 g/dL — ABNORMAL LOW (ref 13.0–17.0)
MCHC: 34.5 g/dL (ref 30.0–36.0)
Platelets: 109 10*3/uL — ABNORMAL LOW (ref 150–400)
RDW: 13.4 % (ref 11.5–15.5)

## 2010-03-29 LAB — BODY FLUID CULTURE

## 2010-03-29 LAB — MAGNESIUM: Magnesium: 1.9 mg/dL (ref 1.5–2.5)

## 2010-03-29 LAB — HEPATITIS PANEL, ACUTE
HCV Ab: NEGATIVE
Hep A IgM: NEGATIVE

## 2010-03-29 LAB — PROTIME-INR: INR: 1.46 (ref 0.00–1.49)

## 2010-03-29 LAB — APTT: aPTT: 39 seconds — ABNORMAL HIGH (ref 24–37)

## 2010-03-29 LAB — AMMONIA: Ammonia: 13 umol/L (ref 11–35)

## 2010-03-30 LAB — BODY FLUID CULTURE: Culture: NO GROWTH

## 2010-03-30 LAB — BODY FLUID CELL COUNT WITH DIFFERENTIAL: Lymphs, Fluid: 26 %

## 2010-03-30 LAB — PROTEIN, BODY FLUID: Total protein, fluid: <3 g/dL

## 2010-03-30 LAB — GLUCOSE, SEROUS FLUID

## 2010-04-15 ENCOUNTER — Other Ambulatory Visit: Payer: Self-pay | Admitting: Family Medicine

## 2010-04-15 DIAGNOSIS — E876 Hypokalemia: Secondary | ICD-10-CM

## 2010-04-16 ENCOUNTER — Other Ambulatory Visit (INDEPENDENT_AMBULATORY_CARE_PROVIDER_SITE_OTHER)

## 2010-04-16 DIAGNOSIS — E876 Hypokalemia: Secondary | ICD-10-CM

## 2010-04-16 LAB — BASIC METABOLIC PANEL
BUN: 9 mg/dL (ref 6–23)
CO2: 27 mEq/L (ref 19–32)
Calcium: 8.9 mg/dL (ref 8.4–10.5)
Creatinine, Ser: 1 mg/dL (ref 0.4–1.5)
Glucose, Bld: 91 mg/dL (ref 70–99)

## 2010-04-21 ENCOUNTER — Telehealth: Payer: Self-pay | Admitting: *Deleted

## 2010-04-21 NOTE — Telephone Encounter (Signed)
Wife called to let you know that patient has not been taking the spironolactone. She wanted you to be aware that it was causing him to feel dizzy so that is why he stopped, she is asking if you would want to replace it with something else. Please advise.

## 2010-04-22 ENCOUNTER — Other Ambulatory Visit: Payer: Self-pay | Admitting: *Deleted

## 2010-04-22 MED ORDER — ALPRAZOLAM 0.25 MG PO TABS: ORAL_TABLET | ORAL | Status: DC

## 2010-04-22 NOTE — Telephone Encounter (Signed)
Left message on machine at home for patient or his spouse to call office to make an appt with Dr. Dayton Martes.

## 2010-04-22 NOTE — Telephone Encounter (Signed)
Please have them come in for appointment so we can check his bp and discuss.

## 2010-04-24 ENCOUNTER — Other Ambulatory Visit: Payer: Self-pay

## 2010-04-24 ENCOUNTER — Other Ambulatory Visit: Payer: Self-pay | Admitting: *Deleted

## 2010-04-24 MED ORDER — ALPRAZOLAM 0.25 MG PO TABS: ORAL_TABLET | ORAL | Status: DC

## 2010-04-24 NOTE — Telephone Encounter (Signed)
Spoke with Leonette Most, pharmacist at [facility] and Leonette Most did not received the refill on alprazolam 0.25 # 30 on 04/22/10. The faxed request today was for quantity #90.Please advise.

## 2010-04-24 NOTE — Telephone Encounter (Signed)
Medication phoned to [facility] as instructed.  

## 2010-04-24 NOTE — Telephone Encounter (Signed)
Opened in error

## 2010-04-30 NOTE — Telephone Encounter (Signed)
Rx called to pharmacy

## 2010-05-21 ENCOUNTER — Other Ambulatory Visit: Payer: Self-pay | Admitting: *Deleted

## 2010-05-21 MED ORDER — PROMETHAZINE HCL 25 MG PO TABS: 25.0000 mg | ORAL_TABLET | Freq: Four times a day (QID) | ORAL | Status: DC | PRN

## 2010-05-21 NOTE — Telephone Encounter (Signed)
Rx called to [facility]. 

## 2010-05-21 NOTE — Telephone Encounter (Signed)
PLEASE CALL IN, E-SCRIPT IS DOWN FOR [facility]

## 2010-05-26 NOTE — Discharge Summary (Signed)
NAMEFERMAN, BASILIO                ACCOUNT NO.:  [id number]   MEDICAL RECORD NO.:  [id number]          PATIENT TYPE:  INP   LOCATION:  4732                         FACILITY:  [facility]   PHYSICIAN:  Maple Mirza, PA   DATE OF BIRTH:  05/22/1945   DATE OF ADMISSION:  03/07/2007  DATE OF DISCHARGE:  03/08/2007                               DISCHARGE SUMMARY   ALLERGIES:  NO KNOWN DRUG ALLERGIES.   TIME OF THIS DICTATION AND EXAM AND FOLLOW-UP IN DETAILS:  Greater than  35 minutes.   FINAL DIAGNOSES:  1. Admitted with chest pain, some dyspnea, some radiation to the left      arm with recurrence twice February 24.  2. Concomitant sinus infection, possible trigger.  3. Hypokalemia secondary to nausea and vomiting all this week      secondary to sinusitis.   SECONDARY DIAGNOSES:  1. Strong family history of coronary artery disease, coronary artery      history in the father, coronary artery bypass in the brother at age      37.  2. No prior cardiac history.  3. No restrictions to activity while the patient is an outpatient.  No      procedures this admission.  A patient Myoview study is planned.   BRIEF HISTORY:  [name] is a 65 year old male.  He has no prior  cardiac history.  He had chest pain which originated on the lateral  aspect of the left breast and moved into the shoulder while he was  having this chest pain February 24 at work which lasted 4 hours.  He  also had trouble getting a deep breath.  His onset was gradual, but then  became quite marked and then after 4 hours it resolved.  He was at home  when he had a second recurrence in the evening.  He had the same gradual  onset to marked symptoms at the same place on the left lateral aspect of  the breast moving into the shoulder.  His discomfort lasted until he  came to the emergency room and received IV nitroglycerin which resolved  it.  In the emergency room, the first of several troponin I studies were  taken.  They  were negative.  Electrocardiogram was nondiagnostic for  acute myocardial infarction.  The patient was seen by cardiology for  consult and admission brought in under IV nitroglycerin and IV heparin.  He was started on metoprolol 25 mg b.i.d. and maintained on all his  other medications at home.   The patient also mentions that he has been in the throws of a sinusitis  which causes drainage which causes stomach upset and he has had some  nausea and vomiting the past 4-5 days.  It is possible this is a trigger  to his chest discomfort.   HOSPITAL COURSE:  The patient admitted through the emergency room with  chest pain which did resolve with nitroglycerin.  Troponin I studies are  0.01 then 0.02.  Electrocardiogram showed no evidence of ST elevation or  depression.  The patient's chest pain resolved with  nitroglycerin.  The  drip was eventually discontinued.  The patient has remained chest pain  free.  At the time of exam, post hospital day #1, his lungs were clear.  He was in sinus rhythm.  He has no edema.  The patient also mentions  that as an outpatient he has no restrictions to his activity.  He can  climb 2-3 flights of stairs without getting short of breath and walk  anywhere he wants to.  The patient has a history of hypertension and is  on lisinopril.  He also has benign prostatic hypertrophy and is on  Cardura for this.  He was seen by Dr. Graciela Husbands who discussed with the  patient the possibility that this was a typical pain for angina, but  there were no elevations in cardiac enzymes.  He also thought that the  concomitant sinus infection could be a trigger.  Dr. Graciela Husbands discussed  Myoview study as an outpatient versus catheterization.  The patient  feels well and would opt for a Myoview study done soon after discharge.  This will be done at about 8 o'clock in the morning on February 26.  The  patient is asked not to eat anything after midnight and to wear  comfortable shoes.  This  will be a treadmill Myoview study.  He has a  follow-up with Dr. Odessa Fleming PA Thursday March 5 at 3:30.  To discuss  results of the stress test.  He knows to call [phone] if he has  recurrence of chest pain.  If he has recurrence though, a very low  threshold for catheterization in this patient.   DISCHARGE MEDICATIONS:  The patient will go home on the following  medication.  1. Enteric-coated aspirin 325 mg daily.  This is new.  2. Lisinopril 10 mg daily.  3. Cardura 8 mg daily.  4. Zithromax 500 mg tablets one tablet daily for the next 4 days.      This is new.   STUDIES:  Once again he has a stress test scheduled for February 26 at 8  o'clock.   LABORATORY STUDIES:  This admission, complete blood count white cells  are 6, hemoglobin 12.4, hematocrit 35.3, platelets 106.  Sodium is 127.  He was given IV normal saline.  Potassium 3.4.  He was given 40 mEq of  potassium.  Chloride was 95, carbonate 24, BUN is 5, creatinine 0.97 and  glucose 103.  Once again troponin I studies are 0.01 then 0.02.  The  magnesium 1.9.      Maple Mirza, PA     GM/MEDQ  D:  03/08/2007  T:  03/09/2007  Job:  [id number]   cc:   Duke Salvia, MD, [facility]  [facility] Medicine

## 2010-05-26 NOTE — H&P (Signed)
[name], [name]                ACCOUNT NO.:  [id number]   MEDICAL RECORD NO.:  [id number]          PATIENT TYPE:  INP   LOCATION:  4732                         FACILITY:  [facility]   PHYSICIAN:  Vernice Jefferson, MD          DATE OF BIRTH:  03/27/1945   DATE OF ADMISSION:  03/07/2007  DATE OF DISCHARGE:                              HISTORY & PHYSICAL   PRIMARY PHYSICIAN:  Patient does not have one.   CARDIOLOGIST:  He is seen by [facility] for primary care.   CHIEF COMPLAINT:  Chest pain x12 hours.   HISTORY OF PRESENTING ILLNESS:  Patient is a 65 year old white male with  no past cardiac history, only cardiovascular risk factors are  hypertension and early family history, who comes in with complaints of  chest pain that began earlier today at work.  He reports that it was  pressure like, it radiated to the left arm and his neck, and associated  with some shortness of breath.  Patient states it resolved after 4 hours  and then while at home today patient reported that this chest pain  recurred while at home watching TV today.  He reports not having any  prior chest pain complaints prior to today.  Additionally, patient did  report he did have an upper GI likely viral gastritis that has given  some nausea over the past week/week and a half.   PAST MEDICAL HISTORY:  1. Hypertension.  2. BPH.   SOCIAL HISTORY:  Never smoker.  Three glasses of wine per day.  No drug  use.   FAMILY HISTORY:  A brother with a CABG in mid 9s.   ALLERGIES:  NO KNOWN DRUG ALLERGIES.   MEDS:  1. Cardura 8 mg p.o. daily.  2. Lisinopril 10 mg daily.  3. Patient is not taking aspirin on a daily basis.   REVIEW OF SYSTEMS:  Negative on full review of systems except for  otherwise dictated in the above HPI.   PHYSICAL EXAM:  His blood pressure is 123/80.  Heart rate of 99,  afebrile.  GENERAL:  Well-developed, well-nourished, white male in no acute  distress.  HEENT:  Moist mucous membranes.  No scleral  icterus.  No conjunctival  pallor.  NECK:  Supple.  Full range of motion.  No jugular venous distention.  CARDIOVASCULAR:  Regular rate and rhythm without rubs, murmurs, or  gallops.  CHEST:  Clear to auscultation bilaterally.  No wheezes, rales, or  rhonchi.  SKIN:  No rashes.  No lesions.  ABDOMEN:  Soft, nontender, and nondistended.  Normoactive bowel sounds.  EXTREMITIES:  No peripheral edema.  Pulses 2+ bilaterally.  NEURO EXAM:  Alert and oriented x3.  Cranial nerves II-XII grossly  intact and nonfocal exam.   MEDICAL DECISION MAKING:  Chest x-ray demonstrates no acute infiltrate  or process.  CT scan performed on in 2005 per PE does not demonstrate  any coronary artery calcifications personally reviewed by me.  EKG  demonstrates normal sinus rhythm, nonspecific T-wave abnormality.   LABORATORY DATA:  Hemoglobin 14.3, BUN and creatinine  7 and 1.2.  Cardiac biomarkers are negative x1 set.   IMPRESSION:  1. Acute coronary syndrome, unstable angina.  2. Hypertension.  3. Benign prostatic hypertrophy.   PLAN:  Since his clincal history is more of a typical presentation,  although his TIMI risk score is only 2, we will initiate heparin tonight  and aspirin.  Given his relative tachycardia, we will initiate  metoprolol, continue his lisinopril, and keep him n.p.o. for noninvasive  versus invasive risk stratification depending on his biomarkers and EKG.      Vernice Jefferson, MD  Electronically Signed     JT/MEDQ  D:  03/07/2007  T:  03/08/2007  Job:  [id number]

## 2010-05-26 NOTE — Discharge Summary (Signed)
NAMEYAZEN, ROSKO                ACCOUNT NO.:  [id number]   MEDICAL RECORD NO.:  [id number]          PATIENT TYPE:  INP   LOCATION:  4732                         FACILITY:  [facility]   PHYSICIAN:  Jesse Sans. Wall, MD, FACCDATE OF BIRTH:  10-08-1945   DATE OF ADMISSION:  03/07/2007  DATE OF DISCHARGE:  03/08/2007                               DISCHARGE SUMMARY   ADDENDUM:  An extra medication has been also given to this patient and  is simvastatin 40 mg daily at bedtime.  That is the message to add to  his medication list.      Maple Mirza, PA      Jesse Sans. Daleen Squibb, MD, [facility]  Electronically Signed    GM/MEDQ  D:  03/08/2007  T:  03/09/2007  Job:  [id number]

## 2010-05-26 NOTE — Assessment & Plan Note (Signed)
[name]                         ELECTROPHYSIOLOGY OFFICE NOTE   [name]                       MRN:          [id number]  DATE:03/16/2007                            DOB:          1945-11-13    This is a gentleman who presents in no acute distress in followup for a  hospital admission from March 07, 2007 to March 08, 2007.  At that  time he was admitted with chest pain and dyspnea with two episodes on  February 24th.  He was also having nausea and vomiting and some marked  sinusitis.   It was thought that the sinusitis which was causing nausea from  postnasal drip was possibly a trigger for the patient's chest pain.  The  patient did come to the emergency room with his second occurrence of  chest pain on February 24th.  It was relieved with IV nitroglycerin in  the emergency room.  He had troponin I studies at that admission.  They  were all negative.  Electrocardiogram was non-diagnostic.  After  remission of his chest pain in the emergency room, the patient remained  chest pain free throughout his hospitalization and to date March 5th has  remained free of chest pain.  At the time of his hospitalization,  February 24th and 25th, he was started on Zithromax 500 mg for a 4-day  period.  The patient says that his sinusitis with drainage has resolved,  although he still has some nasal congestion.   As part of the discharge workup, he was set up for a Myoview study and  this was done on February 26th.  The patient exercised for 4 minutes 15  seconds, stopped with fatigue.  There were no significant ST-T wave  changes.  He had frequent PACs.  Ejection fraction was gated at 66% and  there was no sign of scar or ischemia.   PAST MEDICAL HISTORY:  1. Hypertension.  2. Benign prostatic hypertrophy.   The results of the Myoview study were discussed with the patient.  He  reports that he has had no recurrence of chest pain but that he  still  has some nasal congestion but without postnasal drip causing nausea.   MEDICATIONS:  1. Enteric-coated aspirin 325 mg daily.  2. Lisinopril 10 mg daily.  3. Cardura 8 mg daily.  4. Simvastatin 40 mg nightly.  The patient has completed the Zithromax as mentioned above.   PHYSICAL EXAMINATION:  GENERAL:  Alert and oriented x3.  HEENT:  The oropharynx shows no particular erythema.  There is some  evidence of rhinophyma and the patient is well congested.  He is  definitely mouth breathing.  NECK:  Supple without carotid bruits.  LUNGS:  Some few rhonchi at the bases but no rales.  HEART:  Regular rate and rhythm without murmur.  There is a split S1 S2  but no S3 or S4.  ABDOMEN:  Soft, nondistended, nontender.  Bowel sounds are present.  No  hepatosplenomegaly.  No abdominal aortic aneurysm on deep palpation.  EXTREMITIES:  Show no evidence of clubbing, cyanosis, or edema.  He has  palpable pedal pulses bilaterally.   The patient was given a prescription for Flonase 2 sprays per naris  daily and is basically cleared from a cardiology standpoint at the  present time.  He is given our number if he has any trouble with  recurrent chest pain.  It is possible that with recurrence we would have  a low threshold for catheterization.  Of note, his wife also asked when  would be a good time for an electrocardiogram for her, and we said that  at her age it would be fine for her to have an electrocardiogram.  In  fact some people get one every year.      Maple Mirza, PA  Electronically Signed      Duke Salvia, MD, [facility]  Electronically Signed   GM/MedQ  DD: 03/16/2007  DT: 03/16/2007  Job #: [id number]   cc:   Arta Silence, MD

## 2010-05-28 ENCOUNTER — Ambulatory Visit (INDEPENDENT_AMBULATORY_CARE_PROVIDER_SITE_OTHER): Admitting: Family Medicine

## 2010-05-28 VITALS — BP 140/90 | HR 72 | Temp 98.6°F | Ht 69.0 in | Wt 196.4 lb

## 2010-05-28 DIAGNOSIS — K7030 Alcoholic cirrhosis of liver without ascites: Secondary | ICD-10-CM

## 2010-05-28 DIAGNOSIS — F1010 Alcohol abuse, uncomplicated: Secondary | ICD-10-CM

## 2010-05-28 DIAGNOSIS — E871 Hypo-osmolality and hyponatremia: Secondary | ICD-10-CM

## 2010-05-28 MED ORDER — FLUTICASONE PROPIONATE 50 MCG/ACT NA SUSP: 2.0000 | Freq: Every day | NASAL | Status: DC

## 2010-05-28 MED ORDER — ALPRAZOLAM 0.25 MG PO TABS: ORAL_TABLET | ORAL | Status: DC

## 2010-05-28 MED ORDER — SPIRONOLACTONE 25 MG PO TABS: 25.0000 mg | ORAL_TABLET | Freq: Every day | ORAL | Status: DC

## 2010-05-28 NOTE — Progress Notes (Signed)
Rx for [facility]razolam [facility].

## 2010-05-28 NOTE — Assessment & Plan Note (Signed)
Currently stable. Repeat labs in 1-2 months.

## 2010-05-28 NOTE — Progress Notes (Signed)
65 yo with h/o ETOH abuse and liver cirhossis here for follow up.  Has cut back to 1 L of wine per week (from 1 L of wine per day). Goes days without eating because he is either passed out or not hungry. Does have nausea, no vomiting.  Overall, feels like he is doing better. Weight is stable. Trying to eat more. Still working 6 hours per day.    Wt Readings from Last 3 Encounters:  05/28/10 196 lb 6.4 oz (89.086 kg)  03/04/10 197 lb 8 oz (89.585 kg)  01/09/10 187 lb 8 oz (85.049 kg)   H/o a h/o hyponatremia induced seizures and ETOH liver cirhossis. Lab Results  Component Value Date   CREATININE 1.0 04/16/2010   Lab Results  Component Value Date   NA 136 04/16/2010   K 4.1 04/16/2010   CL 99 04/16/2010   CO2 27 04/16/2010    The PMH, PSH, Social History, Family History, Medications, and allergies have been reviewed in [facility], and have been updated if relevant.  Review of Systems       See HPI General:  Denies fever. CV:  Denies chest pain or discomfort. GI: Denies abdominal pain, change in bowel habits, and vomiting.  Physical Exam BP 140/90  Pulse 72  Temp(Src) 98.6 F (37 C) (Oral)  Ht 5\' 9"  (1.753 m)  Wt 196 lb 6.4 oz (89.086 kg)  BMI 29.00 kg/m2  General:  alert and well-developed, tearful but appropriate. VSS smells of ETOH, disheveled Lungs:  Normal respiratory effort, chest expands symmetrically. Lungs are clear to auscultation, no crackles or wheezes. Heart:  Normal rate and regular rhythm. S1 and S2 normal without gallop, murmur, click, rub or other extra sounds. Abdomen:  some distension, pos BS,  no tenderness, no guarding Msk:  Large echymosis under left patella,  no effusionFull range of motions, ankle has normal stability. Neurologic:  alert & oriented X3.   A little shaky, no asterixix Psych:  normally interactive and good eye contact.

## 2010-05-28 NOTE — Assessment & Plan Note (Signed)
Unchanged. Pt continues to drink but trying to cut back. Still resistant to detox/rehabilitation.

## 2010-07-02 ENCOUNTER — Ambulatory Visit: Admitting: Family Medicine

## 2010-07-02 ENCOUNTER — Ambulatory Visit (INDEPENDENT_AMBULATORY_CARE_PROVIDER_SITE_OTHER): Admitting: Family Medicine

## 2010-07-02 ENCOUNTER — Other Ambulatory Visit: Payer: Self-pay | Admitting: *Deleted

## 2010-07-02 ENCOUNTER — Encounter: Payer: Self-pay | Admitting: Family Medicine

## 2010-07-02 VITALS — BP 120/72 | HR 88 | Temp 98.1°F | Ht 69.0 in | Wt 194.8 lb

## 2010-07-02 DIAGNOSIS — N400 Enlarged prostate without lower urinary tract symptoms: Secondary | ICD-10-CM

## 2010-07-02 DIAGNOSIS — E871 Hypo-osmolality and hyponatremia: Secondary | ICD-10-CM

## 2010-07-02 DIAGNOSIS — I10 Essential (primary) hypertension: Secondary | ICD-10-CM

## 2010-07-02 DIAGNOSIS — Z125 Encounter for screening for malignant neoplasm of prostate: Secondary | ICD-10-CM

## 2010-07-02 DIAGNOSIS — K7030 Alcoholic cirrhosis of liver without ascites: Secondary | ICD-10-CM

## 2010-07-02 LAB — BASIC METABOLIC PANEL
BUN: 6 mg/dL (ref 6–23)
Calcium: 8.6 mg/dL (ref 8.4–10.5)
Creatinine, Ser: 0.9 mg/dL (ref 0.4–1.5)
GFR: 89.18 mL/min (ref >60.00)
Potassium: 3.8 mEq/L (ref 3.5–5.1)

## 2010-07-02 LAB — PSA: PSA: 1.52 ng/mL (ref 0.10–4.00)

## 2010-07-02 LAB — HEPATIC FUNCTION PANEL
Albumin: 4.1 g/dL (ref 3.5–5.2)
Total Bilirubin: 0.7 mg/dL (ref 0.3–1.2)

## 2010-07-02 MED ORDER — TERAZOSIN HCL 1 MG PO CAPS: 1.0000 mg | ORAL_CAPSULE | Freq: Every day | ORAL | Status: DC

## 2010-07-02 MED ORDER — ALPRAZOLAM 0.25 MG PO TABS: ORAL_TABLET | ORAL | Status: DC

## 2010-07-02 MED ORDER — PROMETHAZINE HCL 25 MG PO TABS: 25.0000 mg | ORAL_TABLET | Freq: Four times a day (QID) | ORAL | Status: DC | PRN

## 2010-07-02 NOTE — Telephone Encounter (Signed)
[facility]

## 2010-07-02 NOTE — Telephone Encounter (Signed)
Attempted to call Ms. Shiroma at number provided. No answer.  Left voicemail stating that she could call me back and that we did draw labs today looking at his electrolytes.

## 2010-07-02 NOTE — Telephone Encounter (Signed)
Received a phone call from patients spouse stating that he has not been taking his medications.  He has also been complaining of leg and hand cramps.  She is requesting Dr. Dayton Martes give her a call if possible.

## 2010-07-02 NOTE — Progress Notes (Signed)
65 yo with h/o ETOH abuse and liver cirhossis here for follow up.  Still admits to drinking several glasses of wine per night. No recent falls or blacking out episodes. His wife is away in [place].  Overall, feels like he is doing better. Weight is stable.   Wt Readings from Last 3 Encounters:  07/02/10 194 lb 12 oz (88.338 kg)  05/28/10 196 lb 6.4 oz (89.086 kg)  03/04/10 197 lb 8 oz (89.585 kg)    H/o a h/o hyponatremia induced seizures and ETOH liver cirhossis. Lab Results  Component Value Date   CREATININE 1.0 04/16/2010   Lab Results  Component Value Date   NA 136 04/16/2010   K 4.1 04/16/2010   CL 99 04/16/2010   CO2 27 04/16/2010    Patient Active Problem List  Diagnoses  . HYPERLIPIDEMIA  . Hyposmolality and/or hyponatremia  . HYPOKALEMIA  . ANXIETY STATE, UNSPECIFIED  . ALCOHOL ABUSE  . UNSPECIFIED HEARING LOSS  . HYPERTENSION  . ALLERGIC RHINITIS, SEASONAL  . BRONCHITIS, ALLERGIC  . ALCOHOLIC CIRRHOSIS OF LIVER  . BENIGN PROSTATIC HYPERTROPHY  . HYDROCELES, BILATERAL  . IMPOTENCE, ORGANIC ORIGIN  . ROSACEA  . LEG PAIN, LEFT  . ORTHOSTATIC DIZZINESS  . MEMORY LOSS  . LEG EDEMA, BILATERAL  . ECCHYMOSES  . SHORTNESS OF BREATH  . NAUSEA  . ABDOMINAL DISTENSION  . OTHER ASCITES  . HYPERGLYCEMIA  . LIVER FUNCTION TESTS, ABNORMAL  . CERVICAL MUSCLE STRAIN  . UNSPECIFIED DEFICIENCY ANEMIA   No past medical history on file. No past surgical history on file. History  Substance Use Topics  . Smoking status: Never Smoker   . Smokeless tobacco: Not on file  . Alcohol Use: Not on file   No family history on file. Allergies  Allergen Reactions  . Nifedipine     REACTION: SWELLING   Current Outpatient Prescriptions on File Prior to Visit  Medication Sig Dispense Refill  . ALPRAZolam (XANAX) 0.25 MG tablet Take one tablet by mouth 3 times a day as needed for anxiety.  90 tablet  0  . fluticasone (FLONASE) 50 MCG/ACT nasal spray 2 sprays by Nasal route daily.   16 g  12  . furosemide (LASIX) 20 MG tablet Take 20 mg by mouth every other day.        . promethazine (PHENERGAN) 25 MG tablet Take 1 tablet (25 mg total) by mouth every 6 (six) hours as needed.  30 tablet  0  . simvastatin (ZOCOR) 40 MG tablet Take 40 mg by mouth at bedtime.        [name] spironolactone (ALDACTONE) 25 MG tablet Take 1 tablet (25 mg total) by mouth daily.  30 tablet  3    The PMH, PSH, Social History, Family History, Medications, and allergies have been reviewed in [facility], and have been updated if relevant.  Review of Systems       See HPI General:  Denies fever. CV:  Denies chest pain or discomfort. GI: Denies abdominal pain, change in bowel habits, and vomiting.  Physical Exam BP 120/72  Pulse 88  Temp(Src) 98.1 F (36.7 C) (Oral)  Ht 5\' 9"  (1.753 m)  Wt 194 lb 12 oz (88.338 kg)  BMI 28.76 kg/m2  General:  alert and well-developed VSS Lungs:  Normal respiratory effort, chest expands symmetrically. Lungs are clear to auscultation, no crackles or wheezes. Heart:  Normal rate and regular rhythm. S1 and S2 normal without gallop, murmur, click, rub or other extra sounds. Abdomen:  less distension, pos BS,  no tenderness, no guarding Msk:  Large echymosis under left patella,  no effusionFull range of motions, ankle has normal stability. Neurologic:  alert & oriented X3.   A little shaky, no asterixix Psych:  normally interactive and good eye contact.    1. HYPERTENSION  Stable.  Continue current meds.   2. Hyposmolality and/or hyponatremia  Unchanged.  Recheck BMET today.  3. Alcoholic cirrhosis of liver  Overall appears stable. Recheck LFTs today.

## 2010-07-07 ENCOUNTER — Encounter: Payer: Self-pay | Admitting: *Deleted

## 2010-07-09 ENCOUNTER — Other Ambulatory Visit (INDEPENDENT_AMBULATORY_CARE_PROVIDER_SITE_OTHER): Admitting: Family Medicine

## 2010-07-09 ENCOUNTER — Other Ambulatory Visit: Payer: Self-pay | Admitting: Family Medicine

## 2010-07-09 DIAGNOSIS — R945 Abnormal results of liver function studies: Secondary | ICD-10-CM

## 2010-07-09 DIAGNOSIS — E876 Hypokalemia: Secondary | ICD-10-CM

## 2010-07-09 LAB — HEPATIC FUNCTION PANEL
ALT: 25 U/L (ref 0–53)
Alkaline Phosphatase: 87 U/L (ref 39–117)
Bilirubin, Direct: 0.2 mg/dL (ref 0.0–0.3)
Total Bilirubin: 0.8 mg/dL (ref 0.3–1.2)
Total Protein: 6.8 g/dL (ref 6.0–8.3)

## 2010-07-09 LAB — BASIC METABOLIC PANEL
CO2: 30 mEq/L (ref 19–32)
Chloride: 95 mEq/L — ABNORMAL LOW (ref 96–112)
Potassium: 3.6 mEq/L (ref 3.5–5.1)
Sodium: 134 mEq/L — ABNORMAL LOW (ref 135–145)

## 2010-07-14 ENCOUNTER — Ambulatory Visit (INDEPENDENT_AMBULATORY_CARE_PROVIDER_SITE_OTHER): Admitting: Family Medicine

## 2010-07-14 ENCOUNTER — Encounter: Payer: Self-pay | Admitting: Family Medicine

## 2010-07-14 ENCOUNTER — Encounter: Payer: Self-pay | Admitting: Gastroenterology

## 2010-07-14 DIAGNOSIS — R413 Other amnesia: Secondary | ICD-10-CM

## 2010-07-14 DIAGNOSIS — Z1211 Encounter for screening for malignant neoplasm of colon: Secondary | ICD-10-CM

## 2010-07-14 DIAGNOSIS — K7030 Alcoholic cirrhosis of liver without ascites: Secondary | ICD-10-CM

## 2010-07-14 DIAGNOSIS — E785 Hyperlipidemia, unspecified: Secondary | ICD-10-CM

## 2010-07-14 DIAGNOSIS — F1010 Alcohol abuse, uncomplicated: Secondary | ICD-10-CM

## 2010-07-14 DIAGNOSIS — Z0000 Encounter for general adult medical examination without abnormal findings: Secondary | ICD-10-CM | POA: Insufficient documentation

## 2010-07-14 DIAGNOSIS — E871 Hypo-osmolality and hyponatremia: Secondary | ICD-10-CM

## 2010-07-14 LAB — LIPID PANEL
LDL Cholesterol: 39 mg/dL (ref 0–99)
Total CHOL/HDL Ratio: 2

## 2010-07-14 LAB — BASIC METABOLIC PANEL
CO2: 29 mEq/L (ref 19–32)
Calcium: 8.6 mg/dL (ref 8.4–10.5)
Chloride: 101 mEq/L (ref 96–112)
Creatinine, Ser: 0.9 mg/dL (ref 0.4–1.5)
Sodium: 138 mEq/L (ref 135–145)

## 2010-07-14 NOTE — Progress Notes (Signed)
65 yo with h/o ETOH abuse and liver cirhossis here for CPX.  PSA stable. Due for colonoscopy.   Still admits to drinking several glasses of wine per night. No recent falls or blacking out episodes. Overall, feels like he is doing better. Has not been taking his lasix or spironolactone since last week. Wanted to talk to me about it first.  Weight is actually stable without diuretics.  Wt Readings from Last 3 Encounters:  07/14/10 193 lb 8 oz (87.771 kg)  07/02/10 194 lb 12 oz (88.338 kg)  05/28/10 196 lb 6.4 oz (89.086 kg)     H/o a h/o hyponatremia induced seizures and ETOH liver cirhossis. Lab Results  Component Value Date   CREATININE 0.8 07/09/2010   Lab Results  Component Value Date   NA 134* 07/09/2010   K 3.6 07/09/2010   CL 95* 07/09/2010   CO2 30 07/09/2010    Patient Active Problem List  Diagnoses  . HYPERLIPIDEMIA  . Hyposmolality and/or hyponatremia  . HYPOKALEMIA  . ANXIETY STATE, UNSPECIFIED  . ALCOHOL ABUSE  . UNSPECIFIED HEARING LOSS  . HYPERTENSION  . ALLERGIC RHINITIS, SEASONAL  . BRONCHITIS, ALLERGIC  . ALCOHOLIC CIRRHOSIS OF LIVER  . BENIGN PROSTATIC HYPERTROPHY  . HYDROCELES, BILATERAL  . IMPOTENCE, ORGANIC ORIGIN  . ROSACEA  . LEG PAIN, LEFT  . ORTHOSTATIC DIZZINESS  . MEMORY LOSS  . LEG EDEMA, BILATERAL  . ECCHYMOSES  . SHORTNESS OF BREATH  . NAUSEA  . ABDOMINAL DISTENSION  . OTHER ASCITES  . HYPERGLYCEMIA  . LIVER FUNCTION TESTS, ABNORMAL  . CERVICAL MUSCLE STRAIN  . UNSPECIFIED DEFICIENCY ANEMIA  . BPH (benign prostatic hyperplasia)   No past medical history on file. No past surgical history on file. History  Substance Use Topics  . Smoking status: Never Smoker   . Smokeless tobacco: Not on file  . Alcohol Use: Not on file   No family history on file. Allergies  Allergen Reactions  . Nifedipine     REACTION: SWELLING   Current Outpatient Prescriptions on File Prior to Visit  Medication Sig Dispense Refill  .  ALPRAZolam (XANAX) 0.25 MG tablet Take one tablet by mouth 3 times a day as needed for anxiety.  90 tablet  0  . fluticasone (FLONASE) 50 MCG/ACT nasal spray 2 sprays by Nasal route daily.  16 g  12  . furosemide (LASIX) 20 MG tablet Take 20 mg by mouth every other day.        . promethazine (PHENERGAN) 25 MG tablet Take 1 tablet (25 mg total) by mouth every 6 (six) hours as needed.  30 tablet  0  . simvastatin (ZOCOR) 40 MG tablet Take 40 mg by mouth at bedtime.        [name] spironolactone (ALDACTONE) 25 MG tablet Take 1 tablet (25 mg total) by mouth daily.  30 tablet  3  . terazosin (HYTRIN) 1 MG capsule Take 1 capsule (1 mg total) by mouth at bedtime.  30 capsule  3    The PMH, PSH, Social History, Family History, Medications, and allergies have been reviewed in [facility], and have been updated if relevant.  Review of Systems       See HPI .Patient reports no  vision/ hearing changes,anorexia, weight change, fever ,adenopathy, persistant / recurrent hoarseness, swallowing issues, chest pain, edema,persistant / recurrent cough, hemoptysis, dyspnea(rest, exertional, paroxysmal nocturnal), gastrointestinal  bleeding (melena, rectal bleeding), abdominal pain, excessive heart burn, GU symptoms(dysuria, hematuria, pyuria, voiding/incontinence  Issues) syncope, focal weakness,  severe memory loss, concerning skin lesions, depression, anxiety, abnormal bruising/bleeding, major joint swelling.     Physical Exam BP 130/90  Pulse 87  Temp(Src) 98.3 F (36.8 C) (Oral)  Ht 5\' 9"  (1.753 m)  Wt 193 lb 8 oz (87.771 kg)  BMI 28.57 kg/m2  General:  overweght male in NAD Eyes:  PERRL Ears:  External ear exam shows no significant lesions or deformities.  Otoscopic examination reveals clear canals, tympanic membranes are intact bilaterally without bulging, retraction, inflammation or discharge. Hearing is grossly normal bilaterally. Nose:  External nasal examination shows no deformity or inflammation. Nasal mucosa  are pink and moist without lesions or exudates. Mouth:  Oral mucosa and oropharynx without lesions or exudates.  Teeth in good repair. Neck:  no carotid bruit or thyromegaly no cervical or supraclavicular lymphadenopathy  Lungs:  Normal respiratory effort, chest expands symmetrically. Lungs are clear to auscultation, no crackles or wheezes. Heart:  Normal rate and regular rhythm. S1 and S2 normal without gallop, murmur, click, rub or other extra sounds. Abdomen:  Bowel sounds positive,abdomen soft and non-tender without masses, organomegaly or hernias noted. Genitalia:  Testes bilaterally descended without nodularity, tenderness or masses. No scrotal masses or lesions. No penis lesions or urethral discharge. Prostate:  Prostate gland firm and smooth, 1 plus enlargement, no nodularity, tenderness, mass, asymmetry or induration. Pulses:  R and L posterior tibial pulses are full and equal bilaterally  Extremities:  no edema   Assessment and Plan:  1. Routine general medical examination at a health care facility  Reviewed preventive care protocols, scheduled due services, and updated immunizations Discussed nutrition, exercise, diet, and healthy lifestyle.    2 HYPERLIPIDEMIA  Recheck lipid panel today  3. ALCOHOL ABUSE  Unchanged.    4. HYPERTENSION  Stable.  Continue current meds.   5. Hyposmolality and/or hyponatremia  Improved.  Restart Spironolactone but use Lasix as needed based on weight. Recheck BMET today. See pt instructions for details.   6. Alcoholic cirrhosis of liver  Stable.[name]

## 2010-07-14 NOTE — Patient Instructions (Signed)
Please restart the Spironolactone. Your weight today was 193.  Weigh yourself at home today and write that number down. If you are above 5 pounds from what you are today, take a furosemide 20 mg tablet every other day until your weight goes back down.

## 2010-07-28 ENCOUNTER — Other Ambulatory Visit: Payer: Self-pay | Admitting: *Deleted

## 2010-07-28 MED ORDER — SIMVASTATIN 40 MG PO TABS: 40.0000 mg | ORAL_TABLET | Freq: Every day | ORAL | Status: DC

## 2010-08-10 ENCOUNTER — Ambulatory Visit (AMBULATORY_SURGERY_CENTER): Admitting: *Deleted

## 2010-08-10 VITALS — Ht 69.0 in | Wt 190.0 lb

## 2010-08-10 DIAGNOSIS — Z1211 Encounter for screening for malignant neoplasm of colon: Secondary | ICD-10-CM

## 2010-08-10 MED ORDER — PEG-KCL-NACL-NASULF-NA ASC-C 100 G PO SOLR: ORAL | Status: DC

## 2010-08-12 ENCOUNTER — Other Ambulatory Visit: Payer: Self-pay | Admitting: Family Medicine

## 2010-08-12 MED ORDER — ALPRAZOLAM 0.25 MG PO TABS: ORAL_TABLET | ORAL | Status: DC

## 2010-08-12 NOTE — Telephone Encounter (Signed)
Alprazolam last filled 07/02/10.

## 2010-08-13 ENCOUNTER — Other Ambulatory Visit: Payer: Self-pay | Admitting: *Deleted

## 2010-08-13 NOTE — Telephone Encounter (Signed)
Refused.  Refilled yesterday.

## 2010-08-17 NOTE — Telephone Encounter (Signed)
Rx called to [facility]. 

## 2010-08-22 ENCOUNTER — Other Ambulatory Visit: Payer: Self-pay | Admitting: Family Medicine

## 2010-08-24 ENCOUNTER — Other Ambulatory Visit: Admitting: Gastroenterology

## 2010-08-25 ENCOUNTER — Other Ambulatory Visit: Payer: Self-pay | Admitting: *Deleted

## 2010-08-25 NOTE — Telephone Encounter (Signed)
See Previous refill request.

## 2010-08-26 ENCOUNTER — Encounter: Payer: Self-pay | Admitting: Gastroenterology

## 2010-08-26 ENCOUNTER — Ambulatory Visit (AMBULATORY_SURGERY_CENTER): Admitting: Gastroenterology

## 2010-08-26 VITALS — BP 111/71 | HR 93 | Temp 98.4°F | Resp 18 | Ht 69.0 in | Wt 190.0 lb

## 2010-08-26 DIAGNOSIS — K5730 Diverticulosis of large intestine without perforation or abscess without bleeding: Secondary | ICD-10-CM | POA: Insufficient documentation

## 2010-08-26 DIAGNOSIS — Z1211 Encounter for screening for malignant neoplasm of colon: Secondary | ICD-10-CM

## 2010-08-26 DIAGNOSIS — K635 Polyp of colon: Secondary | ICD-10-CM

## 2010-08-26 DIAGNOSIS — D126 Benign neoplasm of colon, unspecified: Secondary | ICD-10-CM

## 2010-08-26 DIAGNOSIS — Z86010 Personal history of colonic polyps: Secondary | ICD-10-CM

## 2010-08-26 MED ORDER — SODIUM CHLORIDE 0.9 % IV SOLN: 500.0000 mL | INTRAVENOUS | Status: DC

## 2010-08-26 NOTE — Progress Notes (Signed)
PT'S BROTHER-IN-LAW SAID THE PT WAS HAVING BLACK STOOLS AND WANTS THE DOCTOR TO BE AWARE OF THIS. maw

## 2010-08-26 NOTE — Patient Instructions (Signed)
Please review discharge instruction (blue and green sheets)  Await pathology on polyp

## 2010-08-27 ENCOUNTER — Telehealth: Payer: Self-pay | Admitting: *Deleted

## 2010-08-27 NOTE — Telephone Encounter (Signed)
No identifier on answering machine, no message left. 

## 2010-09-01 ENCOUNTER — Encounter: Payer: Self-pay | Admitting: Gastroenterology

## 2010-09-07 ENCOUNTER — Encounter: Payer: Self-pay | Admitting: Family Medicine

## 2010-09-07 ENCOUNTER — Telehealth: Payer: Self-pay | Admitting: *Deleted

## 2010-09-07 ENCOUNTER — Ambulatory Visit (INDEPENDENT_AMBULATORY_CARE_PROVIDER_SITE_OTHER)
Admission: RE | Admit: 2010-09-07 | Discharge: 2010-09-07 | Disposition: A | Discharge status: Home / Self Care | Source: Ambulatory Visit | Attending: Family Medicine | Admitting: Family Medicine

## 2010-09-07 ENCOUNTER — Ambulatory Visit (INDEPENDENT_AMBULATORY_CARE_PROVIDER_SITE_OTHER): Admitting: Family Medicine

## 2010-09-07 VITALS — BP 130/80 | HR 84 | Temp 98.3°F | Wt 206.0 lb

## 2010-09-07 DIAGNOSIS — R600 Localized edema: Secondary | ICD-10-CM | POA: Insufficient documentation

## 2010-09-07 DIAGNOSIS — R609 Edema, unspecified: Secondary | ICD-10-CM

## 2010-09-07 DIAGNOSIS — R0602 Shortness of breath: Secondary | ICD-10-CM

## 2010-09-07 LAB — CBC WITH DIFFERENTIAL/PLATELET
Basophils Absolute: 0 10*3/uL (ref 0.0–0.1)
Eosinophils Absolute: 0 10*3/uL (ref 0.0–0.7)
HCT: 16.4 % — CL (ref 39.0–52.0)
Lymphs Abs: 1 10*3/uL (ref 0.7–4.0)
MCHC: 31.7 g/dL (ref 30.0–36.0)
MCV: 90.2 fl (ref 78.0–100.0)
Monocytes Absolute: 0.7 10*3/uL (ref 0.1–1.0)
Neutrophils Relative %: 75.5 % (ref 43.0–77.0)
Platelets: 198 10*3/uL (ref 150.0–400.0)
RDW: 18.9 % — ABNORMAL HIGH (ref 11.5–14.6)

## 2010-09-07 LAB — HEPATIC FUNCTION PANEL
ALT: 14 U/L (ref 0–53)
Bilirubin, Direct: 0.1 mg/dL (ref 0.0–0.3)
Total Bilirubin: 0.4 mg/dL (ref 0.3–1.2)

## 2010-09-07 LAB — BASIC METABOLIC PANEL
BUN: 18 mg/dL (ref 6–23)
Chloride: 99 mEq/L (ref 96–112)
Creatinine, Ser: 0.9 mg/dL (ref 0.4–1.5)
Glucose, Bld: 99 mg/dL (ref 70–99)
Potassium: 4.2 mEq/L (ref 3.5–5.1)

## 2010-09-07 MED ORDER — OXYCODONE HCL 5 MG PO CAPS: 5.0000 mg | ORAL_CAPSULE | ORAL | Status: AC | PRN

## 2010-09-07 NOTE — Telephone Encounter (Signed)
Patient's wife called and stated that she had talked with earlier on the phone today and has more questions and would like for you to call her back.

## 2010-09-07 NOTE — Telephone Encounter (Signed)
Call Report:  Hemoglobin 5.2, HCT 16.4, RBC 1.87.  Report was also faxed to [phone] and given to Dr. Dayton Martes.  See Lab results, patient notified.

## 2010-09-07 NOTE — Progress Notes (Signed)
65yo with h/o ETOH abuse and liver cirhossis here for increased LE edema.  Increased LE edema for past two weeks. Also SOB with exertion, not sure if he is wheezing. Weight is increasing. Taking Lasix 20 mg every other day due to recurrent hyponatremia and Spironolactone 25 mg daily. H/o a h/o hyponatremia induced seizures and ETOH liver cirhossis.  Admits to still drinking 0.5 bottle of wine per day.  Wt Readings from Last 3 Encounters:  09/07/10 206 lb (93.441 kg)  08/26/10 190 lb (86.183 kg)  08/10/10 190 lb (86.183 kg)     Lab Results  Component Value Date   NA 138 07/14/2010   K 4.2 07/14/2010   CL 101 07/14/2010   CO2 29 07/14/2010    The PMH, PSH, Social History, Family History, Medications, and allergies have been reviewed in [facility], and have been updated if relevant.  Review of Systems       See HPI General:  Denies fever. CV:  Denies chest pain or discomfort. GI: Denies abdominal pain, change in bowel habits, and vomiting.  Physical Exam BP 130/80  Pulse 84  Temp(Src) 98.3 F (36.8 C) (Oral)  Wt 206 lb (93.441 kg)  General:  alert and well-developed, tearful but appropriate. VSS smells of ETOH, disheveled Lungs:  Normal respiratory effort, chest expands symmetrically. Decreased BS at bases bilaterally, right>left, no wheezing or crackles.   Heart:  Normal rate and regular rhythm. S1 and S2 normal without gallop, murmur, click, rub or other extra sounds. Abdomen:  some distension, pos BS,  no tenderness, no guarding Msk:  Large echymosis under left patella,  no effusionFull range of motions, ankle has normal stability. Neurologic:  alert & oriented X3.   A little shaky, no asterixix Psych:  normally interactive and good eye contact.   Ext:  2+ pitting edema bilaterally LE  Assessment and Plan: 1. Shortness of breath   Probably multifactorial. Decreased BS at bases- ? Pulmonary effusion. Also likely anemic. Orders Placed This Encounter  Procedures  . DG Chest  2 View  . Basic metabolic panel  . CBC with Differential  . Hepatic function panel     DG Chest 2 View  2. Lower extremity edema         See above, likely due to chronic cirhossis.      Will check stat labs and xray before increasing diuretics due to h/o hyponatremia.     The patient indicates understanding of these issues and agrees with the plan.

## 2010-09-08 NOTE — Telephone Encounter (Signed)
Called his wife back. Has been having black stool. Looked back at notes from GI- brother in law called Dr. Norval Gable office on 8/15 and informed them of black stools.  I am concerned now that anemia is due to GI bleed. Pt still refusing to go to hospital.  I see recent colonoscopy report, not endoscopy. [name], please make GI aware that he has a probable GI bleed- will need work up once he is admitted. I am working with his wife to get to him to go to the hospital.

## 2010-09-09 ENCOUNTER — Telehealth: Payer: Self-pay | Admitting: *Deleted

## 2010-09-09 NOTE — Telephone Encounter (Signed)
Pt's wife called to let you know that pt still has not gone to hospital. She says he will go when he gets to [facility] tomorrow- they have to go to get their military ID's renewed.  Advised his wife that pt's condition is very serious and very dangerous and she needs to understand that pt reallly needs to be in the hospital.  She agrees, says he will go tomorrow.

## 2010-09-09 NOTE — Telephone Encounter (Signed)
Called GI office to give them Dr Dellie Catholic message.

## 2010-09-09 NOTE — Telephone Encounter (Signed)
Received a call from Howard at [facility] about pt. Dr Dayton Martes saw pt yesterday and advised him to go to the hospital and he refused. She would like for [place] to see the pt. Pt with hx of Cirrhosis, Hyponatremia and Hyponatremia-induced seizures, ETOH abuse and per notes still admits to drinking 0.5 bottle of wine daily. He had a Direct COLON on 08/26/10 with removal of a sessile polyp that bx as an adenoma. I spoke with Dr Jarold Motto who advised pt to go to the ER. Dr Dayton Martes ran labs and pt's H&H was 5.2& 16.4; this was a dramatic drop from 03/13/10 when it was 9.9 & 29.0; His Sodium was 132. I left a message on home number and I called the wife's cell. Wife, [name] stated pt stated his stools were black before his COLON, but now they are normal and she reports he is very tired. She stated they are going to [facility] this week- pt is retired Arts development officer- and he can go to the hospital there. She gave me pt's cell # [phone] and I called and spoke with him. Explained to pt his blood is extremely low that's why he is tired, sob and it's probably contributing to his edema. The low blood volume can cause him to have a heart attack or stroke and he can have seizures again because of his hyponatremia. Advised him he can go to the ER get blood and leave AMA if he chooses - he just needs to go. Pt refuses saying he has no way to go and he is going to [employer] and he will get it checked there. He stated every time he goes to the ER he ends up staying several days and has a $3-4k bill. Again advised pt he was in a dangerous situation and he was taking his life in his own hands and he still refused. Called pt's wife at work, [phone] and explained everything to her. She stated she could get someone to take him, but she knows he won't go. She will try to get his blood checked tomorrow at [facility]. Wife thanked [place] for our help.

## 2010-09-09 NOTE — Telephone Encounter (Signed)
Thank you, [name]. My intention was that you all could see him in the hospital as I thought after I had conversations with patients and his wife, along with my medical assistant about the severity of this situation, he would go to the hospital. I appreciate you all trying to convince him as well. Once again, my intention was not for him to be seen as an outpatient. Thank you.

## 2010-09-15 ENCOUNTER — Telehealth: Payer: Self-pay | Admitting: *Deleted

## 2010-09-15 ENCOUNTER — Inpatient Hospital Stay (HOSPITAL_COMMUNITY)
Admission: EM | Admit: 2010-09-15 | Discharge: 2010-09-18 | DRG: 378 | Disposition: A | Discharge status: Home / Self Care | Attending: Internal Medicine | Admitting: Internal Medicine

## 2010-09-15 DIAGNOSIS — Z79899 Other long term (current) drug therapy: Secondary | ICD-10-CM

## 2010-09-15 DIAGNOSIS — Z860100 Personal history of colon polyps, unspecified: Secondary | ICD-10-CM

## 2010-09-15 DIAGNOSIS — I10 Essential (primary) hypertension: Secondary | ICD-10-CM | POA: Diagnosis present

## 2010-09-15 DIAGNOSIS — K2971 Gastritis, unspecified, with bleeding: Secondary | ICD-10-CM | POA: Diagnosis present

## 2010-09-15 DIAGNOSIS — F411 Generalized anxiety disorder: Secondary | ICD-10-CM | POA: Diagnosis present

## 2010-09-15 DIAGNOSIS — K7030 Alcoholic cirrhosis of liver without ascites: Secondary | ICD-10-CM | POA: Diagnosis present

## 2010-09-15 DIAGNOSIS — K254 Chronic or unspecified gastric ulcer with hemorrhage: Principal | ICD-10-CM | POA: Diagnosis present

## 2010-09-15 DIAGNOSIS — E785 Hyperlipidemia, unspecified: Secondary | ICD-10-CM | POA: Diagnosis present

## 2010-09-15 DIAGNOSIS — N400 Enlarged prostate without lower urinary tract symptoms: Secondary | ICD-10-CM | POA: Diagnosis present

## 2010-09-15 DIAGNOSIS — F1020 Alcohol dependence, uncomplicated: Secondary | ICD-10-CM | POA: Diagnosis present

## 2010-09-15 DIAGNOSIS — K766 Portal hypertension: Secondary | ICD-10-CM | POA: Diagnosis present

## 2010-09-15 DIAGNOSIS — J309 Allergic rhinitis, unspecified: Secondary | ICD-10-CM | POA: Diagnosis present

## 2010-09-15 DIAGNOSIS — D500 Iron deficiency anemia secondary to blood loss (chronic): Secondary | ICD-10-CM | POA: Diagnosis present

## 2010-09-15 DIAGNOSIS — E876 Hypokalemia: Secondary | ICD-10-CM | POA: Diagnosis present

## 2010-09-15 LAB — CBC
HCT: 22.2 % — ABNORMAL LOW (ref 39.0–52.0)
MCH: 26 pg (ref 26.0–34.0)
MCV: 82.8 fL (ref 78.0–100.0)
Platelets: 234 10*3/uL (ref 150–400)
RBC: 2.23 MIL/uL — ABNORMAL LOW (ref 4.22–5.81)
RBC: 2.68 MIL/uL — ABNORMAL LOW (ref 4.22–5.81)
WBC: 7.5 10*3/uL (ref 4.0–10.5)
WBC: 9.5 10*3/uL (ref 4.0–10.5)

## 2010-09-15 LAB — COMPREHENSIVE METABOLIC PANEL
ALT: 10 U/L (ref 0–53)
AST: 20 U/L (ref 0–37)
Albumin: 3.4 g/dL — ABNORMAL LOW (ref 3.5–5.2)
Alkaline Phosphatase: 102 U/L (ref 39–117)
Potassium: 3.4 mEq/L — ABNORMAL LOW (ref 3.5–5.1)
Sodium: 131 mEq/L — ABNORMAL LOW (ref 135–145)
Total Protein: 7.4 g/dL (ref 6.0–8.3)

## 2010-09-15 LAB — RETICULOCYTES: Retic Count, Absolute: 111.1 10*3/uL (ref 19.0–186.0)

## 2010-09-15 LAB — PHOSPHORUS: Phosphorus: 3.1 mg/dL (ref 2.3–4.6)

## 2010-09-15 LAB — LACTATE DEHYDROGENASE: LDH: 135 U/L (ref 94–250)

## 2010-09-15 NOTE — Telephone Encounter (Signed)
Pt's wife called today, left message that pt had gone to ER and asks that we call the hospital and tell them what the pt needs.  I called her back and left a message advising her that we cant do that, we leave the hospitalists to make their own decisions regarding caring for the pt while in hospital.

## 2010-09-16 LAB — PREPARE RBC (CROSSMATCH)

## 2010-09-16 LAB — OCCULT BLOOD X 1 CARD TO LAB, STOOL: Fecal Occult Bld: NEGATIVE

## 2010-09-16 LAB — LIPID PANEL
Cholesterol: 117 mg/dL (ref 0–200)
LDL Cholesterol: 42 mg/dL (ref 0–99)
Triglycerides: 51 mg/dL (ref <150)

## 2010-09-16 LAB — CBC
MCH: 27.7 pg (ref 26.0–34.0)
MCV: 82.5 fL (ref 78.0–100.0)
Platelets: 190 10*3/uL (ref 150–400)
RDW: 16.4 % — ABNORMAL HIGH (ref 11.5–15.5)

## 2010-09-16 LAB — IRON AND TIBC
Saturation Ratios: 37 % (ref 20–55)
TIBC: 361 ug/dL (ref 215–435)
UIBC: 227 ug/dL (ref 125–400)

## 2010-09-16 LAB — TSH: TSH: 2.349 u[IU]/mL (ref 0.350–4.500)

## 2010-09-16 NOTE — Telephone Encounter (Signed)
Spoke with Mrs. Lovingood and she stated that [name] is in the hospital [facility]) and has been since yesterday.  She stated that his hemoglobin was still low but not low as it has been.  She will call back tomorrow with an update.

## 2010-09-16 NOTE — Telephone Encounter (Signed)
Agreed but please call Ms. Hegner to check up on her and Mr. Collard. Thanks. I did review ER notes which confirmed anemia. Please keep [place] posted.

## 2010-09-17 ENCOUNTER — Encounter: Payer: Self-pay | Admitting: Gastroenterology

## 2010-09-17 DIAGNOSIS — D6489 Other specified anemias: Secondary | ICD-10-CM

## 2010-09-17 DIAGNOSIS — K921 Melena: Secondary | ICD-10-CM

## 2010-09-17 LAB — CARDIAC PANEL(CRET KIN+CKTOT+MB+TROPI)
CK, MB: 3.1 ng/mL (ref 0.3–4.0)
CK, MB: 4.1 ng/mL — ABNORMAL HIGH (ref 0.3–4.0)
Relative Index: INVALID (ref 0.0–2.5)
Relative Index: INVALID (ref 0.0–2.5)
Total CK: 58 U/L (ref 7–232)
Troponin I: <0.3 ng/mL (ref <0.30)
Troponin I: <0.3 ng/mL

## 2010-09-17 LAB — COMPREHENSIVE METABOLIC PANEL
AST: 20 U/L (ref 0–37)
Albumin: 3.5 g/dL (ref 3.5–5.2)
Calcium: 9.2 mg/dL (ref 8.4–10.5)
Creatinine, Ser: 0.91 mg/dL (ref 0.50–1.35)
Total Protein: 7.3 g/dL (ref 6.0–8.3)

## 2010-09-17 LAB — PROTIME-INR
INR: 1.17 (ref 0.00–1.49)
Prothrombin Time: 15.1 seconds (ref 11.6–15.2)

## 2010-09-17 LAB — TYPE AND SCREEN
ABO/RH(D): A POS
Unit division: 0

## 2010-09-17 LAB — CBC
MCH: 26.9 pg (ref 26.0–34.0)
MCV: 82.6 fL (ref 78.0–100.0)
Platelets: 208 10*3/uL (ref 150–400)
RDW: 17 % — ABNORMAL HIGH (ref 11.5–15.5)

## 2010-09-18 LAB — CBC
HCT: 26.1 % — ABNORMAL LOW (ref 39.0–52.0)
Hemoglobin: 8.5 g/dL — ABNORMAL LOW (ref 13.0–17.0)
MCH: 27 pg (ref 26.0–34.0)
MCHC: 32.6 g/dL (ref 30.0–36.0)
MCV: 82.9 fL (ref 78.0–100.0)
RBC: 3.15 MIL/uL — ABNORMAL LOW (ref 4.22–5.81)

## 2010-09-18 LAB — CARDIAC PANEL(CRET KIN+CKTOT+MB+TROPI)
Relative Index: INVALID (ref 0.0–2.5)
Troponin I: <0.3 ng/mL (ref <0.30)

## 2010-09-18 LAB — AMMONIA: Ammonia: 13 umol/L (ref 11–60)

## 2010-09-22 ENCOUNTER — Ambulatory Visit (INDEPENDENT_AMBULATORY_CARE_PROVIDER_SITE_OTHER): Admitting: Family Medicine

## 2010-09-22 ENCOUNTER — Other Ambulatory Visit: Payer: Self-pay | Admitting: *Deleted

## 2010-09-22 ENCOUNTER — Encounter: Payer: Self-pay | Admitting: Family Medicine

## 2010-09-22 VITALS — BP 130/90 | HR 86 | Temp 98.8°F | Wt 194.0 lb

## 2010-09-22 DIAGNOSIS — K2970 Gastritis, unspecified, without bleeding: Secondary | ICD-10-CM

## 2010-09-22 DIAGNOSIS — D539 Nutritional anemia, unspecified: Secondary | ICD-10-CM

## 2010-09-22 DIAGNOSIS — K2990 Gastroduodenitis, unspecified, without bleeding: Secondary | ICD-10-CM

## 2010-09-22 LAB — CBC WITH DIFFERENTIAL/PLATELET
Basophils Absolute: 0 10*3/uL (ref 0.0–0.1)
Basophils Relative: 0.5 % (ref 0.0–3.0)
Eosinophils Absolute: 0 10*3/uL (ref 0.0–0.7)
Eosinophils Relative: 0.5 % (ref 0.0–5.0)
HCT: 27.9 % — ABNORMAL LOW (ref 39.0–52.0)
Hemoglobin: 9.1 g/dL — ABNORMAL LOW (ref 13.0–17.0)
Lymphocytes Relative: 14.3 % (ref 12.0–46.0)
Lymphs Abs: 1.2 10*3/uL (ref 0.7–4.0)
MCHC: 32.7 g/dL (ref 30.0–36.0)
MCV: 84.4 fl (ref 78.0–100.0)
Monocytes Absolute: 1.4 10*3/uL — ABNORMAL HIGH (ref 0.1–1.0)
Monocytes Relative: 17.1 % — ABNORMAL HIGH (ref 3.0–12.0)
Neutro Abs: 5.6 10*3/uL (ref 1.4–7.7)
Neutrophils Relative %: 67.6 % (ref 43.0–77.0)
Platelets: 296 10*3/uL (ref 150.0–400.0)
RBC: 3.31 Mil/uL — ABNORMAL LOW (ref 4.22–5.81)
RDW: 18.6 % — ABNORMAL HIGH (ref 11.5–14.6)
WBC: 8.3 10*3/uL (ref 4.5–10.5)

## 2010-09-22 MED ORDER — ALPRAZOLAM 0.25 MG PO TABS: ORAL_TABLET | ORAL | Status: DC

## 2010-09-22 MED ORDER — FUROSEMIDE 20 MG PO TABS: 20.0000 mg | ORAL_TABLET | ORAL | Status: DC

## 2010-09-22 MED ORDER — CYCLOBENZAPRINE HCL 10 MG PO TABS: 10.0000 mg | ORAL_TABLET | Freq: Two times a day (BID) | ORAL | Status: DC | PRN

## 2010-09-22 MED ORDER — PANTOPRAZOLE SODIUM 40 MG PO TBEC: 40.0000 mg | DELAYED_RELEASE_TABLET | Freq: Two times a day (BID) | ORAL | Status: DC

## 2010-09-22 NOTE — Patient Instructions (Signed)
Good to see you. Please come back in 2 weeks to recheck you blood work.

## 2010-09-22 NOTE — Telephone Encounter (Signed)
Noted! Thank you

## 2010-09-22 NOTE — Progress Notes (Signed)
65 yo with h/o ETOH abuse and liver cirhossis here for hospital follow up.  Presented to my office on 8/27 with SOB and fatigue. Hgb found to be 5.2.  Advised to go to ER immediately for further work up. Admitted to h/o black stools, had a recent colonoscopy and polypectomy.  Pt presented to ER on 09/15/2010 and was discharged on 09/18/2010. Notes reviewed.  EGD whoed moderate gastritis in the antrum and mild duodenitis in the bulb of the duodenum. Placed on Protonix twice daily and repeat endoscopy.  Pt received three blood transfusions and several areas cauterized during endoscopy. Hgb 8.5 on 9/7.   Feels much better.  Less SOB. No recurrent black stools.  Lab Results  Component Value Date   WBC 11.6* 09/18/2010   HGB 8.5* 09/18/2010   HCT 26.1* 09/18/2010   MCV 82.9 09/18/2010   PLT 219 09/18/2010    Lab Results  Component Value Date   NA 133* 09/17/2010   K 3.6 09/17/2010   CL 98 09/17/2010   CO2 22 09/17/2010    The PMH, PSH, Social History, Family History, Medications, and allergies have been reviewed in [facility], and have been updated if relevant.  Review of Systems       See HPI General:  Denies fever. CV:  Denies chest pain or discomfort. GI: Denies abdominal pain, change in bowel habits, and vomiting.  Physical Exam BP 130/90  Pulse 86  Temp(Src) 98.8 F (37.1 C) (Oral)  Wt 194 lb (87.998 kg)  General:  alert and well-developed, more color in his face today Lungs:  Normal respiratory effort, chest expands symmetrically. Decreased BS at bases bilaterally, right>left, no wheezing or crackles.   Heart:  Normal rate and regular rhythm. S1 and S2 normal without gallop, murmur, click, rub or other extra sounds. Abdomen:  some distension, pos BS,  no tenderness, no guarding Msk:  Large echymosis under left patella,  no effusionFull range of motions, ankle has normal stability. Neurologic:  alert & oriented X3.   A little shaky, no asterixix Psych:  normally interactive and good eye  contact.   Ext:  Edema resolved!  Assessment and Plan:  1. Unspecified deficiency anemia  Improved. Recheck CBC today.  CBC w/Diff  2. Gastritis and duodenitis   Continue protonix twice daily. Follow up with GI next month.

## 2010-09-22 NOTE — Telephone Encounter (Signed)
Dr. Dayton Martes, please comment that you read this note so that I can close it.

## 2010-09-23 ENCOUNTER — Ambulatory Visit: Admitting: Family Medicine

## 2010-09-23 NOTE — Telephone Encounter (Signed)
Rx called to [facility]. 

## 2010-10-02 ENCOUNTER — Telehealth: Payer: Self-pay | Admitting: *Deleted

## 2010-10-02 LAB — CBC
HCT: 35.3 — ABNORMAL LOW
MCHC: 35.2
MCV: 92.7
Platelets: 106 — ABNORMAL LOW
RDW: 13.7
WBC: 6

## 2010-10-02 LAB — BASIC METABOLIC PANEL
BUN: 5 — ABNORMAL LOW
CO2: 24
Chloride: 95 — ABNORMAL LOW
Creatinine, Ser: 0.97
Glucose, Bld: 103 — ABNORMAL HIGH
Potassium: 3.4 — ABNORMAL LOW

## 2010-10-02 LAB — POCT I-STAT CREATININE
Creatinine, Ser: 1.2
Operator id: 294501

## 2010-10-02 LAB — LIPID PANEL
Triglycerides: 89
VLDL: 18

## 2010-10-02 LAB — DIFFERENTIAL
Basophils Relative: 1
Eosinophils Absolute: 0.1
Eosinophils Relative: 1
Lymphs Abs: 1.4
Neutrophils Relative %: 61

## 2010-10-02 LAB — HEPARIN LEVEL (UNFRACTIONATED): Heparin Unfractionated: 0.62

## 2010-10-02 LAB — POCT CARDIAC MARKERS
CKMB, poc: 1.7
Myoglobin, poc: 95.3
Operator id: 294501

## 2010-10-02 LAB — CARDIAC PANEL(CRET KIN+CKTOT+MB+TROPI)
CK, MB: 2.7
Relative Index: 1.6
Relative Index: 1.8
Troponin I: 0.01
Troponin I: 0.02

## 2010-10-02 LAB — I-STAT 8, (EC8 V) (CONVERTED LAB)
Acid-Base Excess: 1
Chloride: 96
Glucose, Bld: 119 — ABNORMAL HIGH
Potassium: 4.2
pCO2, Ven: 38.4 — ABNORMAL LOW
pH, Ven: 7.429 — ABNORMAL HIGH

## 2010-10-02 LAB — MAGNESIUM: Magnesium: 1.9

## 2010-10-02 NOTE — Telephone Encounter (Signed)
Prior auth is needed for pantoprazole, form is on your desk. 

## 2010-10-02 NOTE — Telephone Encounter (Signed)
In my box

## 2010-10-02 NOTE — Telephone Encounter (Signed)
Form faxed by Jacki Cones.

## 2010-10-05 ENCOUNTER — Encounter: Payer: Self-pay | Admitting: Family Medicine

## 2010-10-05 ENCOUNTER — Telehealth: Payer: Self-pay | Admitting: *Deleted

## 2010-10-05 ENCOUNTER — Ambulatory Visit (INDEPENDENT_AMBULATORY_CARE_PROVIDER_SITE_OTHER): Admitting: Family Medicine

## 2010-10-05 VITALS — BP 120/80 | HR 92 | Temp 98.6°F | Wt 196.2 lb

## 2010-10-05 DIAGNOSIS — E871 Hypo-osmolality and hyponatremia: Secondary | ICD-10-CM

## 2010-10-05 DIAGNOSIS — D539 Nutritional anemia, unspecified: Secondary | ICD-10-CM

## 2010-10-05 NOTE — Patient Instructions (Signed)
Good to see you. You can pick up the protonix at your pharmacy

## 2010-10-05 NOTE — Telephone Encounter (Signed)
Spoke with rep from [facility] and was advised that Pantoprazole does not need prior authorization.  There was a coordination of benefits because patient has other insurance.  Patient advised while here to see Dr. Dayton Martes today.

## 2010-10-05 NOTE — Telephone Encounter (Signed)
Prior auth given for pantoprazole, advised pharmacy.  Approval letter placed on doctor's desk for signature and scanning. 

## 2010-10-05 NOTE — Progress Notes (Signed)
65 yo with h/o ETOH abuse and liver cirhossis here for two week follow up.  Presented to my office on 8/27 with SOB and fatigue. Hgb found to be 5.2.  Advised to go to ER immediately for further work up. Admitted to h/o black stools, had a recent colonoscopy and polypectomy.  [name] presented to ER on 09/15/2010 and was discharged on 09/18/2010.  EGD whoed moderate gastritis in the antrum and mild duodenitis in the bulb of the duodenum. Placed on Protonix twice daily and repeat endoscopy.  [name] received three blood transfusions and several areas cauterized during endoscopy. Hgb 8.5 on 9/7, 9.1 on 9/11.   Feels much better.  Less SOB. No recurrent black stools. Feels less fatigued. Weight stable.  Wt Readings from Last 3 Encounters:  10/05/10 196 lb 4 oz (89.018 kg)  09/22/10 194 lb (87.998 kg)  09/07/10 206 lb (93.441 kg)     Lab Results  Component Value Date   WBC 8.3 09/22/2010   HGB 9.1* 09/22/2010   HCT 27.9* 09/22/2010   MCV 84.4 09/22/2010   PLT 296.0 09/22/2010    Lab Results  Component Value Date   NA 133* 09/17/2010   K 3.6 09/17/2010   CL 98 09/17/2010   CO2 22 09/17/2010    The PMH, PSH, Social History, Family History, Medications, and allergies have been reviewed in [facility], and have been updated if relevant.  Review of Systems       See HPI General:  Denies fever. CV:  Denies chest pain or discomfort. GI: Denies abdominal pain, change in bowel habits, and vomiting.  Physical Exam BP 120/80  Pulse 92  Temp(Src) 98.6 F (37 C) (Oral)  Wt 196 lb 4 oz (89.018 kg)  General:  alert and well-developed, more color in his face today Lungs:  Normal respiratory effort, chest expands symmetrically. Decreased BS at bases bilaterally, right>left, no wheezing or crackles.   Heart:  Normal rate and regular rhythm. S1 and S2 normal without gallop, murmur, click, rub or other extra sounds. Abdomen:  some distension, pos BS,  no tenderness, no guarding Msk:  Large echymosis under left  patella,  no effusionFull range of motions, ankle has normal stability. Neurologic:  alert & oriented X3.   A little shaky, no asterixix Psych:  normally interactive and good eye contact.   Ext:  No edema  Assessment and Plan:  1. Unspecified deficiency anemia  Improved. Recheck CBC today.  CBC w/Diff  2. Gastritis and duodenitis   Continue protonix twice daily. Follow up with GI next month.

## 2010-10-06 ENCOUNTER — Other Ambulatory Visit: Payer: Self-pay | Admitting: *Deleted

## 2010-10-06 LAB — CBC WITH DIFFERENTIAL/PLATELET
Basophils Relative: 6.6 % — ABNORMAL HIGH (ref 0.0–3.0)
Eosinophils Relative: 2.4 % (ref 0.0–5.0)
HCT: 28.2 % — ABNORMAL LOW (ref 39.0–52.0)
Lymphs Abs: 1.2 10*3/uL (ref 0.7–4.0)
Monocytes Relative: 12.7 % — ABNORMAL HIGH (ref 3.0–12.0)
Neutrophils Relative %: 58.7 % (ref 43.0–77.0)
Platelets: 223 10*3/uL (ref 150.0–400.0)
RBC: 3.52 Mil/uL — ABNORMAL LOW (ref 4.22–5.81)
WBC: 6.3 10*3/uL (ref 4.5–10.5)

## 2010-10-06 LAB — BASIC METABOLIC PANEL
BUN: 10 mg/dL (ref 6–23)
CO2: 26 mEq/L (ref 19–32)
Glucose, Bld: 90 mg/dL (ref 70–99)
Potassium: 4.7 mEq/L (ref 3.5–5.1)
Sodium: 133 mEq/L — ABNORMAL LOW (ref 135–145)

## 2010-10-06 MED ORDER — CYCLOBENZAPRINE HCL 10 MG PO TABS: 10.0000 mg | ORAL_TABLET | Freq: Two times a day (BID) | ORAL | Status: DC | PRN

## 2010-10-09 NOTE — Discharge Summary (Signed)
[name], DORIS NO.:  [id number]  MEDICAL RECORD NO.:  [id number]  LOCATION:  2020                         FACILITY:  [facility]  PHYSICIAN:  Richarda Overlie, MD       DATE OF BIRTH:  07/09/1945  DATE OF ADMISSION:  09/15/2010 DATE OF DISCHARGE:  09/18/2010                              DISCHARGE SUMMARY   PRIMARY CARE PHYSICIAN:  Ruthe Mannan, M.D., [facility] office.  PRIMARY GASTROENTEROLOGIST:  [facility].  DISCHARGE DIAGNOSES: 1. Anemia/melena, likely secondary to antral ulcers with moderate     gastritis, mild duodenitis and a partial nonobstructing ring at the     GE junction based on an EGD. 2. Status post transfusion of 3 units of packed red blood cells. 3. Hypokalemia. 4. Hypertension. 5. Ascites secondary to portal hypertension. 6. History of adenomatous polyp with a recent colonoscopy with     polypectomy in the right colon, alcoholic liver disease, history of     EtOH dependence and abuse. 7. Allergic rhinitis.  CONSULTATIONS:  [facility] for anemia and alcoholic cirrhosis and portal hypertension.  SUBJECTIVE:  This is a 65 year old male with a past medical history of alcohol abuse; dyslipidemia; liver disease secondary to alcohol ascites; hypertension; history of colon polyps, status post removal of colon polyps 2 weeks ago and a history of BPH who presents to the ER with chief complaint of lightheadedness, dizziness and significantly low hemoglobin of 5.8 in the ER.  He also complained of melena at the time of presentation.  The patient does take an aspirin on a daily basis. The patient also admits to drinking 1-1/2 liters of wine weekly and is now cut down from five gallons per week.  The patient was admitted for further evaluation.  HOSPITAL COURSE: 1. Melena/anemia.  The patient had a thorough GI evaluation done and     had an EGD done that showed irregular Z-line located at 40 cm,     partially nonobstructing ring at the GE  junction. 2. Clean-based antral ulcers without stigmata of recent bleeding,     moderate gastritis in the antrum and mild duodenitis in the bulb of     the duodenum.  GI has recommended that the patient avoid all     NSAIDs.  Continue b.i.d. PPI.  H pylori serum antibody has been     obtained and is currently pending and positive.  He would need     treatment for 14 days with triple therapy.  He would need a repeat     endoscopy in 8-12 weeks to ensure complete healing of his gastric     ulcers.  He would need a repeat CBC in about a week.  He would need     to be on b.i.d. PPI for at least 3 months.  The patient has also     been advised to stop drinking alcohol and a Child psychotherapist     consultation was obtained.  The patient voices understanding of     this.  He has had no signs of withdrawal from his alcohol over the     course of the last 72 hours.  Currently hemodynamically  stable.  PHYSICAL EXAMINATION:  VITAL SIGNS:  Blood pressure 131/84, respirations 18, pulse 78, temperature 98.3, 99% on room air. GENERAL:  Comfortable in no acute cardiopulmonary distress. HEENT:  Pupils equal and reactive.  Extraocular movements intact. NECK:  Supple.  No JVD. LUNGS:  Clear to auscultation bilaterally.  No wheezes or crackles. CARDIOVASCULAR:  Regular rate and rhythm.  No murmurs, rubs or gallops. ABDOMEN:  Soft, nontender, nondistended. EXTREMITIES:  Without cyanosis, clubbing or edema. NEUROLOGIC:  Cranial nerves II-XII grossly intact.  DISCHARGE MEDICATIONS: 1. Multivitamin 1 tablet p.o. daily. 2. Protonix 40 mg p.o. twice daily. 3. B complex with vitamin C 1 tablet p.o. daily. 4. Allegra 1 tablet as needed. 5. Flexeril 10 mg p.o. twice daily as needed. 6. Fluticasone 1000 mcg nasally as needed. 7. Lasix 20 mg p.o. in the morning. 8. Oxycodone 5 mg p.o. q. 4 p.r.n. 9. Aldactone 25 mg 1 tablet p.o. daily. 10.Terazosin 1 mg p.o. at bedtime. 11.Thiamine 100 mg 1 tablet p.o.  daily. 12.Xanax 0.25 mg 1 tablet p.o. 3 times a day as needed.     Richarda Overlie, MD     NA/MEDQ  D:  09/18/2010  T:  09/18/2010  Job:  [id number]  Electronically Signed by Richarda Overlie MD on 10/09/2010 07:11:54 AM

## 2010-10-20 ENCOUNTER — Encounter: Payer: Self-pay | Admitting: Gastroenterology

## 2010-10-20 ENCOUNTER — Ambulatory Visit (INDEPENDENT_AMBULATORY_CARE_PROVIDER_SITE_OTHER): Admitting: Gastroenterology

## 2010-10-20 DIAGNOSIS — T490X5A Adverse effect of local antifungal, anti-infective and anti-inflammatory drugs, initial encounter: Secondary | ICD-10-CM

## 2010-10-20 DIAGNOSIS — F1010 Alcohol abuse, uncomplicated: Secondary | ICD-10-CM | POA: Insufficient documentation

## 2010-10-20 DIAGNOSIS — Z8719 Personal history of other diseases of the digestive system: Secondary | ICD-10-CM

## 2010-10-20 DIAGNOSIS — K259 Gastric ulcer, unspecified as acute or chronic, without hemorrhage or perforation: Secondary | ICD-10-CM

## 2010-10-20 MED ORDER — PANTOPRAZOLE SODIUM 40 MG PO TBEC: 40.0000 mg | DELAYED_RELEASE_TABLET | Freq: Every day | ORAL | Status: DC

## 2010-10-20 NOTE — Progress Notes (Signed)
History of Present Illness: This is a very nice 65 year old Caucasian male recently admitted with upper GI bleed requiring 4 unit transfusion. Endoscopy by Dr.Pyrtle 2 clean-based asked her cultures. Evaluation for H. pylori he has been repeatedly negative. The patient was taking large doses of ibuprofen for degenerative arthritis several years. In the past the patient was counseled about his alcohol abuse and chronic liver disease. Apparently at the time of endoscopy there was no evidence of esophageal varices. Review of labs shows no elevation recently liver function tests. 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 2006 there was no evidence of cirrhosis on biopsy. Patient continues to drink rather heavily on a daily basis.  He currently is asymptomatic on Protonix 40 mg twice a day. He is discontinued instead use. Other medications include chronic Xanax, oxycodone for back pain, and antihypertensive including Aldactone. She denies mental status changes, shortness of breath, good retention, abdominal pain or any specific hepatobiliary or general medical symptoms at this time. Cause peripheral edema he also is on Lasix 20 mg every other day.    Current Medications, Allergies, Past Medical History, Past Surgical History, Family History and Social History were reviewed in [employer] record.   Assessment and plan: NSAID-induced gastric ulcerations with rather significant recent GI bleed. He currently is asymptomatic on Protonix, and I have decreased his dose to 40 mg a day with avoidance of aspirin and NSAIDs. His blood counts are followed by primary care as is his chronic liver disease. We will repeat his endoscopy with propofol sedation in 2 months. Otherwise he is to continue medications as per primary care. And have urged him to moderate his alcohol intake. BMI today was 29. Blood pressure normal at  122/78. Encounter Diagnoses  Name Primary?  . Gastric ulcer Yes  . H/O: upper GI bleed   . Stomach ulcer from aspirin/ibuprofen-like drugs (NSAID's)   . Alcohol abuse

## 2010-10-20 NOTE — Patient Instructions (Signed)
You can decrease your Protonix to once daily. You will need a Endoscopy with Propofol sedation in 2 months.

## 2010-10-22 NOTE — H&P (Signed)
NAMEJAMALE, [name] NO.:  [id number]  MEDICAL RECORD NO.:  [id number]  LOCATION:  2020                         FACILITY:  [facility]  PHYSICIAN:  Rosanna Randy, MDDATE OF BIRTH:  January 23, 1945  DATE OF ADMISSION:  09/15/2010 DATE OF DISCHARGE:                             HISTORY & PHYSICAL   PRIMARY CARE PHYSICIAN:  Ruthe Mannan, M.D., [facility] office  CHIEF COMPLAINT:  Lightheadedness and abnormal lab work.  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  The patient came into the Emergency Department complaining of lightheadedness and dizziness and reports that his primary care physician had called him with abnormal labs specially a lowhemoglobin and asked him to come to the Emergency Department for further evaluation.  The patient denies any chest pain.  Denies any shortness of breath.  No fever.  No abdominal pain.  No nausea.  No vomiting.  He denies any hematemesis, but after discussing into details, he reports that after he had his colonoscopy done, he has noted some intermittent melena that might be the reason for his low hemoglobin.  On admission, the patient had a CBC that demonstrated a hemoglobin of 5.8, reason why the triad hospitalist was called for further evaluation and treatment on [name].  ALLERGIES:  The patient is allergic to NIFEDIPINE and the allergic reaction is swelling.  PAST MEDICAL HISTORY:  Significant for, 1. Alcohol abuse. 2. Hyperlipidemia. 3. Hypertension. 4. Allergic rhinitis. 5. BPH. 6. Muscle spasm. 7. Anxiety. 8. Ascites. 9. Liver cirrhosis secondary to alcohol.  CURRENT MEDICATIONS: 1. Flexeril 10 mg 1 tablet twice a day as needed. 2. Oxycodone 5  mg every 4 hours as needed pain. 3. Terazosin 1 mg 1 capsule daily at bedtime. 4. Aspirin 325 mg 1 tablet daily. 5. Fluticasone 50 mcg nasally 12 sprays daily as needed for allergies. 6. Xanax 0.25 one tablet 3 times a day. 7. Thiamine 100 mg 1 tablet daily. 8. Spironolactone 25 mg 1 tablet daily. 9. Furosemide 20 mg 1 tablet every morning. 10.Allegra 180 mg 1 tablet daily as needed for allergies. 11.B complex supplement 1 tablet by mouth daily.  SOCIAL HISTORY:  The patient lives in [place] with his wife.  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  He denies any smoking. Reports that he is drinking about 1.5 liters of wine weekly after cutting down for almost 5 gallons per week.  He denies any illicit drugs.  FAMILY HISTORY:  Significant for cardiovascular disease and diabetes in his father.  There is also a history of thyroid problems in his mother. There is no other contributory family medical history.  REVIEW OF SYSTEMS:  Negative except as otherwise mentioned on HPI.  PHYSICAL EXAMINATION:  VITAL SIGNS:  The patient have a temperature of 98.7, heart rate 87, respiratory rate 16, blood pressure 123/76. GENERAL:  The patient was in no acute distress, lying in bed, cooperative to examination, reporting that he  is feeling back to normal and no longer lightheaded.  By the moment that the patient was examined, he had already received 1 unit of blood. HEENT:  Normocephalic.  No trauma.  Eyes:  PERLA.  No icterus. Extraocular muscles intact.  There was no discharges coming out of his nostrils or out of his ears.  Mouth with a fair dentition.  Moist mucous membranes.  The patient appears a little bit disheveled and there is slight alcohol smell. NECK:  Supple.  No thyromegaly. LUNGS:  Clear to auscultation.  No crackles.  No wheezes. HEART:  Normal rate and regular rhythm.  S1, S2 normal without murmurs, gallops, or rubs. ABDOMEN:  Mild distention.  No tenderness.   No guarding.  Positive bowel sounds.  There is a mild white fluid appreciated consistent problem with ascites. EXTREMITIES:  No edema, no cyanosis, or clubbing. SKIN:  There was no petechiae or rashes. NEUROLOGIC:  The patient was alert, awake, and oriented x3.  Little jerking, but no asterixis were seen.  Cranial nerves II through XII were grossly intact.  Muscle strength 4/5 bilaterally symmetrically due to poor effort.  No focal neurologic deficit.  PERTINENT LABORATORY DATA:  The patient has a CBC with a white blood cells of 7.5, hemoglobin 5.8, hematocrit 18.2, and platelets 234. Comprehensive metabolic panel demonstrated a sodium of 131, potassium 3.4, chloride 94, bicarb 24, glucose 111, BUN 9, and creatinine 1.0. LFTs demonstrated a total bilirubin of 0.4, alkaline phosphatase 102, AST 20, ALT 10, total protein 7.4, albumin 3.4, and calcium 9.3.  His alcohol level was 15.  The patient is A positive with negative antibody screen.  MRSA PCR screening was positive.  ASSESSMENT AND PLAN: 1. The patient's anemia at this point with a history of melena, most     likely secondary to a slow bleeding after his colonoscopy.  At this     point, we are going to admit the patient to the hospital, check     guaiac stools x3.  He is currently not having a positive fecal     occult blood test.  We are going to monitorize the trend of his     hemoglobin.  We are going to transfuse initially 2 units of red     blood cells.  We are going to check LDH and anemia panel and also a     reticulocyte count.  Depending evolution of the patient, we need to     reconsult GI for inpatient versus outpatient workup.  The patient     is at high risk due to the history of cirrhosis of having a     dysfunctional platelets and that may be the reason why he is     bleeding after the colonoscopy versus development of esophageal     varices reports in the computer demonstrated that a previous EGD     was done  showing no significant abnormalities.  We are going to     start the patient on Protonix. 2. Hypokalemia.  We are going to replete and check a magnesium and     phosphorus level. 3. Hypertension.  We are going to hold on the patient's     antihypertensive drugs while we are in the setting some acute GI     bleed. 4. Ascites mild, appears stable.  We are going to hold the Lasix and     spironolactone. 5. Alcohol abuse.  The patient denies any history of withdrawals in  the past.  Nonetheless, we are going to start thiamine, folic acid,     and we are going to place the patient on CIWA protocol. 6. Allergic rhinitis.  We are going to continue using his fluticasone     and also his Allegra. 7. Hyperlipidemia.  We are going to check a fasting lipid profile. 8. Anxiety.  We are going to continue using p.r.n. Xanax. 9. DVT prophylaxis.  We are going to start the patient on SCDs.     Further tests and treatment are going to be done depending on the     patient's involution and progression throughout this     hospitalization.     Rosanna Randy, MD     CEM/MEDQ  D:  09/15/2010  T:  09/15/2010  Job:  [id number]  cc:   Ruthe Mannan, M.D.  Electronically Signed by Vassie Loll MD on 10/22/2010 07:57:22 AM

## 2010-11-03 ENCOUNTER — Other Ambulatory Visit: Payer: Self-pay | Admitting: Family Medicine

## 2010-11-03 MED ORDER — ALPRAZOLAM 0.25 MG PO TABS: ORAL_TABLET | ORAL | Status: DC

## 2010-11-03 MED ORDER — OXYCODONE HCL 5 MG PO CAPS: 5.0000 mg | ORAL_CAPSULE | ORAL | Status: DC | PRN

## 2010-11-03 NOTE — Telephone Encounter (Signed)
Received faxed refill request from pharmacy. Is it okay to refill medication? 

## 2010-11-03 NOTE — Telephone Encounter (Signed)
Rx for Alprazolam called to pharmacy, Rx for Oxycodone in your IN box for signature.

## 2010-11-03 NOTE — Telephone Encounter (Signed)
Pt requests refill on oxycodone, please call when ready.

## 2010-11-04 ENCOUNTER — Ambulatory Visit (INDEPENDENT_AMBULATORY_CARE_PROVIDER_SITE_OTHER): Admitting: Family Medicine

## 2010-11-04 ENCOUNTER — Encounter: Payer: Self-pay | Admitting: Family Medicine

## 2010-11-04 VITALS — BP 138/90 | HR 95 | Temp 98.3°F | Ht 69.0 in | Wt 195.8 lb

## 2010-11-04 DIAGNOSIS — Z8719 Personal history of other diseases of the digestive system: Secondary | ICD-10-CM

## 2010-11-04 DIAGNOSIS — E876 Hypokalemia: Secondary | ICD-10-CM

## 2010-11-04 DIAGNOSIS — R252 Cramp and spasm: Secondary | ICD-10-CM

## 2010-11-04 LAB — CBC WITH DIFFERENTIAL/PLATELET
Basophils Absolute: 0 10*3/uL (ref 0.0–0.1)
Hemoglobin: 10.3 g/dL — ABNORMAL LOW (ref 13.0–17.0)
Lymphocytes Relative: 18 % (ref 12.0–46.0)
Monocytes Relative: 17 % — ABNORMAL HIGH (ref 3.0–12.0)
Neutro Abs: 4.4 10*3/uL (ref 1.4–7.7)
Platelets: 227 10*3/uL (ref 150.0–400.0)
RDW: 24.8 % — ABNORMAL HIGH (ref 11.5–14.6)

## 2010-11-04 LAB — BASIC METABOLIC PANEL
Chloride: 90 mEq/L — ABNORMAL LOW (ref 96–112)
Potassium: 4.6 mEq/L (ref 3.5–5.1)

## 2010-11-04 LAB — MAGNESIUM: Magnesium: 1.9 mg/dL (ref 1.5–2.5)

## 2010-11-04 NOTE — Telephone Encounter (Signed)
Left message on machine at home, Rx ready for pick up will be left at front desk.

## 2010-11-04 NOTE — Progress Notes (Signed)
65 yo with h/o ETOH abuse and liver cirhossis here for follow up anemia s/p gastritis/GIB and bilateral leg spasms.  In September,  EGD showed moderate gastritis in the antrum and mild duodenitis in the bulb of the duodenum. Placed on Protonix twice daily and repeat endoscopy.  Pt received three blood transfusions and several areas cauterized during endoscopy. Hgb 8.5 on 9/7, 9.1 on 9/11.   Feels much better.  Less SOB. No recurrent black stools. Feels less fatigued. Weight stable.  Wt Readings from Last 3 Encounters:  11/04/10 195 lb 12 oz (88.792 kg)  10/20/10 196 lb (88.905 kg)  10/05/10 196 lb 4 oz (89.018 kg)     Lab Results  Component Value Date   WBC 6.3 10/05/2010   HGB 9.1* 10/05/2010   HCT 28.2* 10/05/2010   MCV 80.1 10/05/2010   PLT 223.0 10/05/2010    Lab Results  Component Value Date   NA 133* 10/05/2010   K 4.7 10/05/2010   CL 98 10/05/2010   CO2 26 10/05/2010    Bilateral leg spasms for past two weeks. Wake him up at night. Had been staying away from foods higher in potatssium b/c at one point, potassium was too high. No CP.  The PMH, PSH, Social History, Family History, Medications, and allergies have been reviewed in [facility], and have been updated if relevant.  Review of Systems       See HPI General:  Denies fever. CV:  Denies chest pain or discomfort. GI: Denies abdominal pain, change in bowel habits, and vomiting.  Physical Exam BP 138/90  Pulse 95  Temp(Src) 98.3 F (36.8 C) (Oral)  Ht 5\' 9"  (1.753 m)  Wt 195 lb 12 oz (88.792 kg)  BMI 28.91 kg/m2 General:  alert and well-developed, more color in his face today Lungs:  Normal respiratory effort, chest expands symmetrically. Decreased BS at bases bilaterally, right>left, no wheezing or crackles.   Heart:  Normal rate and regular rhythm. S1 and S2 normal without gallop, murmur, click, rub or other extra sounds. Abdomen:  some distension, pos BS,  no tenderness, no guarding Msk:  Large echymosis under  left patella,  no effusionFull range of motions, ankle has normal stability. Neurologic:  alert & oriented X3.   A little shaky, no asterixix Psych:  normally interactive and good eye contact.   Ext:  No edema  Assessment and Plan:  1. Unspecified deficiency anemia  Asymptomatic, scheduled for follow up EGD in 2 months. Recheck CBC today.  CBC w/Diff   2. Nocturnal muscle cramps  Magnesium   New, probable multifactorial- ETOH abuse with diuretic use- will check BMET and Mag to rule out electrolyte abnormality. The patient indicates understanding of these issues and agrees with the plan.

## 2010-11-04 NOTE — Patient Instructions (Signed)
Good to see you. We will call you with your lab work tomorrow.

## 2010-11-09 ENCOUNTER — Other Ambulatory Visit (INDEPENDENT_AMBULATORY_CARE_PROVIDER_SITE_OTHER)

## 2010-11-09 DIAGNOSIS — I10 Essential (primary) hypertension: Secondary | ICD-10-CM

## 2010-11-09 LAB — BASIC METABOLIC PANEL
CO2: 24 mEq/L (ref 19–32)
Chloride: 89 mEq/L — ABNORMAL LOW (ref 96–112)
Glucose, Bld: 98 mg/dL (ref 70–99)
Potassium: 4.4 mEq/L (ref 3.5–5.1)
Sodium: 123 mEq/L — ABNORMAL LOW (ref 135–145)

## 2010-11-13 ENCOUNTER — Other Ambulatory Visit (INDEPENDENT_AMBULATORY_CARE_PROVIDER_SITE_OTHER)

## 2010-11-13 DIAGNOSIS — E871 Hypo-osmolality and hyponatremia: Secondary | ICD-10-CM

## 2010-11-13 LAB — BASIC METABOLIC PANEL
BUN: 6 mg/dL (ref 6–23)
CO2: 25 mEq/L (ref 19–32)
Chloride: 89 mEq/L — ABNORMAL LOW (ref 96–112)
Creatinine, Ser: 1 mg/dL (ref 0.4–1.5)

## 2010-11-17 ENCOUNTER — Ambulatory Visit (INDEPENDENT_AMBULATORY_CARE_PROVIDER_SITE_OTHER): Admitting: Family Medicine

## 2010-11-17 ENCOUNTER — Encounter: Payer: Self-pay | Admitting: Family Medicine

## 2010-11-17 VITALS — BP 144/82 | HR 108 | Temp 98.4°F | Ht 69.0 in | Wt 194.8 lb

## 2010-11-17 DIAGNOSIS — R300 Dysuria: Secondary | ICD-10-CM

## 2010-11-17 DIAGNOSIS — E871 Hypo-osmolality and hyponatremia: Secondary | ICD-10-CM | POA: Insufficient documentation

## 2010-11-17 DIAGNOSIS — R350 Frequency of micturition: Secondary | ICD-10-CM

## 2010-11-17 LAB — POCT URINALYSIS DIPSTICK
Bilirubin, UA: NEGATIVE
Blood, UA: NEGATIVE
Glucose, UA: NEGATIVE
Leukocytes, UA: NEGATIVE
Nitrite, UA: NEGATIVE

## 2010-11-17 NOTE — Patient Instructions (Signed)
Please come back next week to recheck your sodium. Limit your fluid intake to 800 cc/day.

## 2010-11-17 NOTE — Progress Notes (Signed)
Subjective:    Patient ID: CHIN WACHTER, male    DOB: July 24, 1945, 65 y.o.   MRN: [id number]  HPI  65 yo well known to me with h/o alcoholic cirrhosis her for ? UTI.  Has had increased urinary frequency for past several weeks. No dysuria, hematuria, back pain, n/v or fevers. Upon further questioning, reveals that he drinks 2 L of unsweetened tea per day. Was supposed to be on fluid restriction for hyponatremia but did not realize that included tea (thought it was just water).  Also drinks at least a liter of wine per day.  Patient Active Problem List  Diagnoses  . HYPERLIPIDEMIA  . Hyposmolality and/or hyponatremia  . HYPOKALEMIA  . ANXIETY STATE, UNSPECIFIED  . ALCOHOL ABUSE  . UNSPECIFIED HEARING LOSS  . HYPERTENSION  . ALLERGIC RHINITIS, SEASONAL  . BRONCHITIS, ALLERGIC  . ALCOHOLIC CIRRHOSIS OF LIVER  . BENIGN PROSTATIC HYPERTROPHY  . HYDROCELES, BILATERAL  . IMPOTENCE, ORGANIC ORIGIN  . ROSACEA  . LEG PAIN, LEFT  . ORTHOSTATIC DIZZINESS  . MEMORY LOSS  . LEG EDEMA, BILATERAL  . ECCHYMOSES  . SHORTNESS OF BREATH  . NAUSEA  . ABDOMINAL DISTENSION  . OTHER ASCITES  . HYPERGLYCEMIA  . LIVER FUNCTION TESTS, ABNORMAL  . CERVICAL MUSCLE STRAIN  . UNSPECIFIED DEFICIENCY ANEMIA  . BPH (benign prostatic hyperplasia)  . Routine general medical examination at a health care facility  . Special screening for malignant neoplasms, colon  . Colon polyp  . Diverticulosis of colon (without mention of hemorrhage)  . Lower extremity edema  . Gastritis and duodenitis  . Gastric ulcer  . H/O: upper GI bleed  . Stomach ulcer from aspirin/ibuprofen-like drugs (NSAID's)  . Alcohol abuse  . Nocturnal muscle cramps  . Hyponatremia   Past Medical History  Diagnosis Date  . HLD (hyperlipidemia) 5/99  . HTN (hypertension) 5/99  . BPH (benign prostatic hypertrophy) 5/99  . Pneumonia     hosp  . GERD (gastroesophageal reflux disease) 07/31/04    gastritis   . Anxiety   . Allergy      pollen/ragweed  . Adenomatous polyp of colon 2006  . Hearing difficulty   . Anemia   . Depression    Past Surgical History  Procedure Date  . L duputryen contractive surgery   . Neck mri 6/04    C5-6 disc abnormality  . Colonoscopy 07/31/04    multiple polyps; repeat in 3 years  . Hosp cp r/o'd 2/24 03/08/07  . Ett myoview 03/09/07    nml EF 66%  . Upper gastrointestinal endoscopy   . Tonsillectomy    History  Substance Use Topics  . Smoking status: Never Smoker   . Smokeless tobacco: Never Used  . Alcohol Use: 4.8 oz/week    8 Glasses of wine per week     1 liter a week   Family History  Problem Relation Age of Onset  . Heart disease Father   . Diabetes Father   . Stroke Father   . Depression Mother   . Heart disease Brother   . Alcohol abuse Brother   . Liver disease Brother    Allergies  Allergen Reactions  . Nifedipine     REACTION: SWELLING   Current Outpatient Prescriptions on File Prior to Visit  Medication Sig Dispense Refill  . ALPRAZolam (XANAX) 0.25 MG tablet Take one tablet by mouth 3 times a day as needed for anxiety.  90 tablet  0  . b complex vitamins  capsule Take 1 capsule by mouth daily. 800 mg       . cyclobenzaprine (FLEXERIL) 10 MG tablet Take 1 tablet (10 mg total) by mouth 2 (two) times daily as needed.  60 tablet  0  . cyclobenzaprine (FLEXERIL) 10 MG tablet TAKE 1 TABLET BY MOUTH 2 TIMES DAILY AS NEEDED.  60 tablet  0  . fexofenadine (ALLEGRA) 180 MG tablet Take 180 mg by mouth daily as needed.        . fluticasone (FLONASE) 50 MCG/ACT nasal spray 2 sprays by Nasal route daily.  16 g  12  . Multiple Vitamin (MULTIVITAMIN) tablet Take 1 tablet by mouth daily.        . mupirocin (BACTROBAN) 2 % cream Apply 1 application topically daily.        . pantoprazole (PROTONIX) 40 MG tablet Take 1 tablet (40 mg total) by mouth daily.  60 tablet  3  . simvastatin (ZOCOR) 40 MG tablet Take 1 tablet (40 mg total) by mouth at bedtime.  30 tablet  6  .  spironolactone (ALDACTONE) 25 MG tablet Take 1 tablet (25 mg total) by mouth daily.  30 tablet  3  . terazosin (HYTRIN) 1 MG capsule Take 1 capsule (1 mg total) by mouth at bedtime.  30 capsule  3  . Thiamine HCl (VITAMIN B-1) 100 MG tablet Take 100 mg by mouth daily.        .The PMH, PSH, Social History, Family History, Medications, and allergies have been reviewed in [facility], and have been updated if relevant.   Review of Systems See HPI    Objective:   Physical Exam BP 144/82  Pulse 108  Temp(Src) 98.4 F (36.9 C) (Oral)  Ht 5\' 9"  (1.753 m)  Wt 194 lb 12 oz (88.338 kg)  BMI 28.76 kg/m2  General: alert and well-developed, more color in his face today  Lungs: Normal respiratory effort, chest expands symmetrically. Decreased BS at bases bilaterally, right>left, no wheezing or crackles.  Heart: Normal rate and regular rhythm. S1 and S2 normal without gallop, murmur, click, rub or other extra sounds.  Abdomen: some distension, pos BS, no tenderness, no guarding  Msk: Large echymosis under left patella, no effusionFull range of motions, ankle has normal stability.  Neurologic: alert & oriented X3.  A little shaky, no asterixix  Psych: normally interactive and good eye contact.  Ext: No edema    Assessment & Plan:   1. Hyponatremia  Only slightly improved but not following fluid restriction. Chronic. Multifactorial- but most due to liver cirhossis/ETOH abuse. Discussed fluid restriction, recheck BMET next week. Basic metabolic panel  2. Urinary frequency  New. UA neg- likely due to #1.

## 2010-11-26 ENCOUNTER — Other Ambulatory Visit (INDEPENDENT_AMBULATORY_CARE_PROVIDER_SITE_OTHER)

## 2010-11-26 DIAGNOSIS — E871 Hypo-osmolality and hyponatremia: Secondary | ICD-10-CM

## 2010-11-26 LAB — BASIC METABOLIC PANEL
Chloride: 94 mEq/L — ABNORMAL LOW (ref 96–112)
Potassium: 4.9 mEq/L (ref 3.5–5.1)
Sodium: 129 mEq/L — ABNORMAL LOW (ref 135–145)

## 2010-11-29 ENCOUNTER — Other Ambulatory Visit: Payer: Self-pay | Admitting: Family Medicine

## 2010-12-10 ENCOUNTER — Other Ambulatory Visit: Payer: Self-pay | Admitting: Family Medicine

## 2010-12-14 ENCOUNTER — Other Ambulatory Visit: Payer: Self-pay | Admitting: *Deleted

## 2010-12-14 MED ORDER — ALPRAZOLAM 0.25 MG PO TABS: ORAL_TABLET | ORAL | Status: DC

## 2010-12-14 NOTE — Telephone Encounter (Signed)
Rx called to [facility].  Called patients home number several times and the line was busy.  Will call back later.

## 2010-12-14 NOTE — Telephone Encounter (Signed)
Left message on machine at home that Rx was called to [facility].

## 2010-12-17 ENCOUNTER — Telehealth: Payer: Self-pay | Admitting: *Deleted

## 2010-12-17 MED ORDER — TERAZOSIN HCL 2 MG PO CAPS: 2.0000 mg | ORAL_CAPSULE | Freq: Every day | ORAL | Status: DC

## 2010-12-17 NOTE — Telephone Encounter (Signed)
Yes ok to increase to 2 mg.  I will send rx to pharmacy.

## 2010-12-17 NOTE — Telephone Encounter (Signed)
Left message on machine at home advising patient as instructed. 

## 2010-12-17 NOTE — Telephone Encounter (Signed)
Patient came in the office today and wanted to know if Dr. Dayton Martes will increase his Rx for Terazosin to 2mg ?  He is currently taking 1mg  at bedtime and he stated that when he took two tablets he slept through the night without waking up.  Please advise.  [facility].

## 2010-12-29 ENCOUNTER — Telehealth: Payer: Self-pay | Admitting: *Deleted

## 2010-12-29 NOTE — Telephone Encounter (Signed)
lmom for pt to call back. Pt needs a f/u EGD to ensure healing of gastric ulcers.

## 2010-12-30 NOTE — Telephone Encounter (Signed)
Spoke with pt to inform him he needs a repeat EGD. Pt stated he doesn't know when he can schedule it; he was very nervous stating his father in law died today. Informed pt I will send him a letter and he can call us back when he has time; pt stated understanding.

## 2010-12-31 ENCOUNTER — Other Ambulatory Visit: Payer: Self-pay | Admitting: Family Medicine

## 2011-01-25 ENCOUNTER — Other Ambulatory Visit: Payer: Self-pay | Admitting: *Deleted

## 2011-01-26 MED ORDER — ALPRAZOLAM 0.25 MG PO TABS: ORAL_TABLET | ORAL | Status: DC

## 2011-01-26 MED ORDER — TERAZOSIN HCL 2 MG PO CAPS: 2.0000 mg | ORAL_CAPSULE | Freq: Every day | ORAL | Status: DC

## 2011-01-26 MED ORDER — PROMETHAZINE HCL 25 MG PO TABS: 25.0000 mg | ORAL_TABLET | Freq: Three times a day (TID) | ORAL | Status: DC | PRN

## 2011-01-26 MED ORDER — CYCLOBENZAPRINE HCL 10 MG PO TABS: 10.0000 mg | ORAL_TABLET | Freq: Two times a day (BID) | ORAL | Status: DC | PRN

## 2011-01-26 NOTE — Telephone Encounter (Signed)
Rx for Alprazolam called to [facility]. 

## 2011-01-29 ENCOUNTER — Ambulatory Visit (INDEPENDENT_AMBULATORY_CARE_PROVIDER_SITE_OTHER): Admitting: Family Medicine

## 2011-01-29 ENCOUNTER — Encounter: Payer: Self-pay | Admitting: Family Medicine

## 2011-01-29 VITALS — BP 140/80 | HR 85 | Temp 98.6°F | Wt 196.2 lb

## 2011-01-29 DIAGNOSIS — E871 Hypo-osmolality and hyponatremia: Secondary | ICD-10-CM

## 2011-01-29 DIAGNOSIS — K7030 Alcoholic cirrhosis of liver without ascites: Secondary | ICD-10-CM

## 2011-01-29 DIAGNOSIS — F1010 Alcohol abuse, uncomplicated: Secondary | ICD-10-CM

## 2011-01-29 DIAGNOSIS — Z8719 Personal history of other diseases of the digestive system: Secondary | ICD-10-CM

## 2011-01-29 LAB — BASIC METABOLIC PANEL
Chloride: 95 mEq/L — ABNORMAL LOW (ref 96–112)
Potassium: 4.6 mEq/L (ref 3.5–5.1)
Sodium: 133 mEq/L — ABNORMAL LOW (ref 135–145)

## 2011-01-29 LAB — CBC WITH DIFFERENTIAL/PLATELET
MCHC: 34 g/dL (ref 30.0–36.0)
MCV: 92.8 fl (ref 78.0–100.0)
Platelets: 214 10*3/uL (ref 150.0–400.0)
RBC: 3.33 Mil/uL — ABNORMAL LOW (ref 4.22–5.81)
RDW: 21.5 % — ABNORMAL HIGH (ref 11.5–14.6)

## 2011-01-29 LAB — HEPATIC FUNCTION PANEL
ALT: 35 U/L (ref 0–53)
AST: 78 U/L — ABNORMAL HIGH (ref 0–37)
Alkaline Phosphatase: 291 U/L — ABNORMAL HIGH (ref 39–117)
Bilirubin, Direct: 0.2 mg/dL (ref 0.0–0.3)
Total Bilirubin: 0.7 mg/dL (ref 0.3–1.2)

## 2011-01-29 NOTE — Progress Notes (Signed)
66 year old pleasant male well known to me with h/o alcoholic liver disease and chronic hyponatremia here to discuss repeat endoscopy.  Has been followed by Dr. Jarold Motto with GI.  Admitted last fall with with upper GI bleed requiring 4 unit transfusion. Endoscopy no evidence of esophageal varices.    Protonix was decreased to 40 mg daily. He has discontinued NSAID use.  Repeat endoscopy was planned for 12/2010 but he plans to reschedule it.  Lab Results  Component Value Date   ALT 12 09/17/2010   AST 20 09/17/2010   ALKPHOS 93 09/17/2010   BILITOT 0.8 09/17/2010   Since he is not taking NSAIDS and he feels fine, he wants to wait.  No blood stools. No nausea or vomiting.  Continues to drink wine but less of it- had "a water glass of wine" yesterday. ROS:  See HPI No CP, No SOB. No LE edema. Patient Active Problem List  Diagnoses  . HYPERLIPIDEMIA  . Hyposmolality and/or hyponatremia  . HYPOKALEMIA  . ANXIETY STATE, UNSPECIFIED  . ALCOHOL ABUSE  . UNSPECIFIED HEARING LOSS  . HYPERTENSION  . ALLERGIC RHINITIS, SEASONAL  . BRONCHITIS, ALLERGIC  . ALCOHOLIC CIRRHOSIS OF LIVER  . BENIGN PROSTATIC HYPERTROPHY  . HYDROCELES, BILATERAL  . IMPOTENCE, ORGANIC ORIGIN  . ROSACEA  . LEG PAIN, LEFT  . ORTHOSTATIC DIZZINESS  . MEMORY LOSS  . LEG EDEMA, BILATERAL  . ECCHYMOSES  . SHORTNESS OF BREATH  . NAUSEA  . ABDOMINAL DISTENSION  . OTHER ASCITES  . HYPERGLYCEMIA  . LIVER FUNCTION TESTS, ABNORMAL  . CERVICAL MUSCLE STRAIN  . UNSPECIFIED DEFICIENCY ANEMIA  . BPH (benign prostatic hyperplasia)  . Routine general medical examination at a health care facility  . Special screening for malignant neoplasms, colon  . Colon polyp  . Diverticulosis of colon (without mention of hemorrhage)  . Lower extremity edema  . Gastritis and duodenitis  . Gastric ulcer  . H/O: upper GI bleed  . Stomach ulcer from aspirin/ibuprofen-like drugs (NSAID's)  . Alcohol abuse  . Nocturnal muscle  cramps  . Hyponatremia  . Urinary frequency   Past Medical History  Diagnosis Date  . HLD (hyperlipidemia) 5/99  . HTN (hypertension) 5/99  . BPH (benign prostatic hypertrophy) 5/99  . Pneumonia     hosp  . GERD (gastroesophageal reflux disease) 07/31/04    gastritis   . Anxiety   . Allergy     pollen/ragweed  . Adenomatous polyp of colon 2006  . Hearing difficulty   . Anemia   . Depression    Past Surgical History  Procedure Date  . L duputryen contractive surgery   . Neck mri 6/04    C5-6 disc abnormality  . Colonoscopy 07/31/04    multiple polyps; repeat in 3 years  . Hosp cp r/o'd 2/24 03/08/07  . Ett myoview 03/09/07    nml EF 66%  . Upper gastrointestinal endoscopy   . Tonsillectomy    History  Substance Use Topics  . Smoking status: Never Smoker   . Smokeless tobacco: Never Used  . Alcohol Use: 4.8 oz/week    8 Glasses of wine per week     1 liter a week   Family History  Problem Relation Age of Onset  . Heart disease Father   . Diabetes Father   . Stroke Father   . Depression Mother   . Heart disease Brother   . Alcohol abuse Brother   . Liver disease Brother    Allergies  Allergen  Reactions  . Nifedipine     REACTION: SWELLING   Current Outpatient Prescriptions on File Prior to Visit  Medication Sig Dispense Refill  . ALPRAZolam (XANAX) 0.25 MG tablet Take one tablet by mouth 3 times a day as needed for anxiety.  90 tablet  0  . b complex vitamins capsule Take 1 capsule by mouth daily. 800 mg       . cyclobenzaprine (FLEXERIL) 10 MG tablet TAKE 1 TABLET BY MOUTH 2 TIMES DAILY AS NEEDED.  60 tablet  0  . cyclobenzaprine (FLEXERIL) 10 MG tablet Take 1 tablet (10 mg total) by mouth 2 (two) times daily as needed.  60 tablet  0  . fexofenadine (ALLEGRA) 180 MG tablet Take 180 mg by mouth daily as needed.        . fluticasone (FLONASE) 50 MCG/ACT nasal spray 2 sprays by Nasal route daily.  16 g  12  . Multiple Vitamin (MULTIVITAMIN) tablet Take 1 tablet  by mouth daily.        . mupirocin (BACTROBAN) 2 % cream Apply 1 application topically daily.        . pantoprazole (PROTONIX) 40 MG tablet Take 1 tablet (40 mg total) by mouth daily.  60 tablet  3  . promethazine (PHENERGAN) 25 MG tablet Take 1 tablet (25 mg total) by mouth every 8 (eight) hours as needed for nausea.  30 tablet  0  . simvastatin (ZOCOR) 40 MG tablet Take 1 tablet (40 mg total) by mouth at bedtime.  30 tablet  6  . spironolactone (ALDACTONE) 25 MG tablet Take 1 tablet (25 mg total) by mouth daily.  30 tablet  3  . terazosin (HYTRIN) 2 MG capsule Take 1 capsule (2 mg total) by mouth at bedtime.  30 capsule  6  . Thiamine HCl (VITAMIN B-1) 100 MG tablet Take 100 mg by mouth daily.         The PMH, PSH, Social History, Family History, Medications, and allergies have been reviewed in [facility], and have been updated if relevant.  Physical exam: BP 140/80  Pulse 85  Temp(Src) 98.6 F (37 C) (Oral)  Wt 196 lb 4 oz (89.018 kg)  General: alert and well-developed, more color in his face today  Lungs: Normal respiratory effort, chest expands symmetrically. Decreased BS at bases bilaterally, right>left, no wheezing or crackles.  Heart: Normal rate and regular rhythm. S1 and S2 normal without gallop, murmur, click, rub or other extra sounds.  Abdomen: some distension, pos BS, no tenderness, no guarding  Msk: Large echymosis under left patella, no effusionFull range of motions, ankle has normal stability.  Neurologic: alert & oriented X3.  A little shaky, no asterixix  Psych: normally interactive and good eye contact.  Ext: No edema  Assessment and Plan:  1. H/O: upper GI bleed   Will recheck labs today, continue daily protonix and pt will reschedule follow up endoscopy. CBC w/Diff  2. Hyposmolality and/or hyponatremia  Recheck BMET today. Basic Metabolic Panel (BMET)  3. Alcoholic cirrhosis of liver  Hepatic function panel

## 2011-01-29 NOTE — Patient Instructions (Signed)
Good to see you, Mr. Lamadrid. Please say hi to your wife for me. We will call you with your results on Monday.

## 2011-02-22 ENCOUNTER — Ambulatory Visit (INDEPENDENT_AMBULATORY_CARE_PROVIDER_SITE_OTHER): Admitting: Family Medicine

## 2011-02-22 ENCOUNTER — Other Ambulatory Visit: Payer: Self-pay | Admitting: Family Medicine

## 2011-02-22 ENCOUNTER — Telehealth: Payer: Self-pay | Admitting: *Deleted

## 2011-02-22 ENCOUNTER — Encounter: Payer: Self-pay | Admitting: Family Medicine

## 2011-02-22 VITALS — BP 112/82 | HR 60 | Temp 98.2°F | Wt 190.5 lb

## 2011-02-22 DIAGNOSIS — F329 Major depressive disorder, single episode, unspecified: Secondary | ICD-10-CM

## 2011-02-22 DIAGNOSIS — R5383 Other fatigue: Secondary | ICD-10-CM

## 2011-02-22 DIAGNOSIS — F3289 Other specified depressive episodes: Secondary | ICD-10-CM

## 2011-02-22 MED ORDER — SPIRONOLACTONE 25 MG PO TABS: 25.0000 mg | ORAL_TABLET | Freq: Every day | ORAL | Status: DC

## 2011-02-22 MED ORDER — CITALOPRAM HYDROBROMIDE 20 MG PO TABS: 20.0000 mg | ORAL_TABLET | Freq: Every day | ORAL | Status: DC

## 2011-02-22 MED ORDER — PROMETHAZINE HCL 25 MG PO TABS: 25.0000 mg | ORAL_TABLET | Freq: Three times a day (TID) | ORAL | Status: DC | PRN

## 2011-02-22 NOTE — Telephone Encounter (Signed)
Spoke with patients spouse [name]) and advised her as instructed.

## 2011-02-22 NOTE — Telephone Encounter (Signed)
Patient's wife called regarding that [facility] call her back with patient's lab results.

## 2011-02-22 NOTE — Telephone Encounter (Signed)
[facility] faxed request for spironolactone 25 mg #30 x [phone].

## 2011-02-22 NOTE — Patient Instructions (Signed)
Good to see you. Please call me in 2 weeks with an update.

## 2011-02-22 NOTE — Progress Notes (Signed)
66 yo with known alcoholism here to discuss anxiety and depression.  Having to work more hours per week, so he is drinking a little less. He is fighting constantly with his wife so they currently are not talking No SI or HI. Feels moody, sad and anxious.  Continues to drink wine but less of it- had "a water glass of wine" yesterday. ROS:  See HPI No CP, No SOB. No LE edema. Patient Active Problem List  Diagnoses  . HYPERLIPIDEMIA  . Hyposmolality and/or hyponatremia  . HYPOKALEMIA  . ANXIETY STATE, UNSPECIFIED  . ALCOHOL ABUSE  . UNSPECIFIED HEARING LOSS  . HYPERTENSION  . ALLERGIC RHINITIS, SEASONAL  . BRONCHITIS, ALLERGIC  . ALCOHOLIC CIRRHOSIS OF LIVER  . BENIGN PROSTATIC HYPERTROPHY  . HYDROCELES, BILATERAL  . IMPOTENCE, ORGANIC ORIGIN  . ROSACEA  . LEG PAIN, LEFT  . ORTHOSTATIC DIZZINESS  . MEMORY LOSS  . LEG EDEMA, BILATERAL  . ECCHYMOSES  . SHORTNESS OF BREATH  . NAUSEA  . ABDOMINAL DISTENSION  . OTHER ASCITES  . HYPERGLYCEMIA  . LIVER FUNCTION TESTS, ABNORMAL  . CERVICAL MUSCLE STRAIN  . UNSPECIFIED DEFICIENCY ANEMIA  . BPH (benign prostatic hyperplasia)  . Routine general medical examination at a health care facility  . Special screening for malignant neoplasms, colon  . Colon polyp  . Diverticulosis of colon (without mention of hemorrhage)  . Lower extremity edema  . Gastritis and duodenitis  . Gastric ulcer  . H/O: upper GI bleed  . Stomach ulcer from aspirin/ibuprofen-like drugs (NSAID's)  . Alcohol abuse  . Nocturnal muscle cramps  . Hyponatremia  . Urinary frequency   Past Medical History  Diagnosis Date  . HLD (hyperlipidemia) 5/99  . HTN (hypertension) 5/99  . BPH (benign prostatic hypertrophy) 5/99  . Pneumonia     hosp  . GERD (gastroesophageal reflux disease) 07/31/04    gastritis   . Anxiety   . Allergy     pollen/ragweed  . Adenomatous polyp of colon 2006  . Hearing difficulty   . Anemia   . Depression    Past Surgical  History  Procedure Date  . L duputryen contractive surgery   . Neck mri 6/04    C5-6 disc abnormality  . Colonoscopy 07/31/04    multiple polyps; repeat in 3 years  . Hosp cp r/o'd 2/24 03/08/07  . Ett myoview 03/09/07    nml EF 66%  . Upper gastrointestinal endoscopy   . Tonsillectomy    History  Substance Use Topics  . Smoking status: Never Smoker   . Smokeless tobacco: Never Used  . Alcohol Use: 4.8 oz/week    8 Glasses of wine per week     1 liter a week   Family History  Problem Relation Age of Onset  . Heart disease Father   . Diabetes Father   . Stroke Father   . Depression Mother   . Heart disease Brother   . Alcohol abuse Brother   . Liver disease Brother    Allergies  Allergen Reactions  . Nifedipine     REACTION: SWELLING   Current Outpatient Prescriptions on File Prior to Visit  Medication Sig Dispense Refill  . ALPRAZolam (XANAX) 0.25 MG tablet Take one tablet by mouth 3 times a day as needed for anxiety.  90 tablet  0  . b complex vitamins capsule Take 1 capsule by mouth daily. 800 mg       . cyclobenzaprine (FLEXERIL) 10 MG tablet TAKE 1 TABLET  BY MOUTH 2 TIMES DAILY AS NEEDED.  60 tablet  0  . cyclobenzaprine (FLEXERIL) 10 MG tablet Take 1 tablet (10 mg total) by mouth 2 (two) times daily as needed.  60 tablet  0  . fexofenadine (ALLEGRA) 180 MG tablet Take 180 mg by mouth daily as needed.        . fluticasone (FLONASE) 50 MCG/ACT nasal spray 2 sprays by Nasal route daily.  16 g  12  . Multiple Vitamin (MULTIVITAMIN) tablet Take 1 tablet by mouth daily.        . mupirocin (BACTROBAN) 2 % cream Apply 1 application topically daily.        . pantoprazole (PROTONIX) 40 MG tablet Take 1 tablet (40 mg total) by mouth daily.  60 tablet  3  . simvastatin (ZOCOR) 40 MG tablet Take 1 tablet (40 mg total) by mouth at bedtime.  30 tablet  6  . spironolactone (ALDACTONE) 25 MG tablet Take 1 tablet (25 mg total) by mouth daily.  30 tablet  3  . terazosin (HYTRIN) 2 MG  capsule Take 1 capsule (2 mg total) by mouth at bedtime.  30 capsule  6  . Thiamine HCl (VITAMIN B-1) 100 MG tablet Take 100 mg by mouth daily.         The PMH, PSH, Social History, Family History, Medications, and allergies have been reviewed in [facility], and have been updated if relevant.  Physical exam: BP 112/82  Pulse 60  Temp(Src) 98.2 F (36.8 C) (Oral)  Wt 190 lb 8 oz (86.41 kg)  General: alert and well-developed, more color in his face today  Lungs: Normal respiratory effort, chest expands symmetrically. Decreased BS at bases bilaterally, right>left, no wheezing or crackles.  Heart: Normal rate and regular rhythm. S1 and S2 normal without gallop, murmur, click, rub or other extra sounds.  Abdomen: some distension, pos BS, no tenderness, no guarding  Msk: Large echymosis under left patella, no effusionFull range of motions, ankle has normal stability.  Neurologic: alert & oriented X3.  A little shaky, no asterixix  Psych: normally interactive and good eye contact but tearful.  Ext: No edema  Assessment and Plan: 1. depression Testosterone, free, total  >25 min spent with face to face with patient, >50% counseling and/or coordinating care. Discussed importance of continuing to cut back on ETOH.  Would benefit from psychotherapy but feels he does not have time. Will start Celexa 20 mg daily. Check testosterone. The patient indicates understanding of these issues and agrees with the plan.

## 2011-02-23 ENCOUNTER — Other Ambulatory Visit: Payer: Self-pay | Admitting: *Deleted

## 2011-02-23 MED ORDER — TERAZOSIN HCL 2 MG PO CAPS: 2.0000 mg | ORAL_CAPSULE | Freq: Every day | ORAL | Status: DC

## 2011-02-23 MED ORDER — PANTOPRAZOLE SODIUM 40 MG PO TBEC: 40.0000 mg | DELAYED_RELEASE_TABLET | Freq: Every day | ORAL | Status: DC

## 2011-02-26 ENCOUNTER — Other Ambulatory Visit (INDEPENDENT_AMBULATORY_CARE_PROVIDER_SITE_OTHER)

## 2011-02-26 DIAGNOSIS — R5383 Other fatigue: Secondary | ICD-10-CM

## 2011-02-26 DIAGNOSIS — R5381 Other malaise: Secondary | ICD-10-CM

## 2011-03-01 ENCOUNTER — Encounter: Payer: Self-pay | Admitting: *Deleted

## 2011-03-01 ENCOUNTER — Telehealth: Payer: Self-pay | Admitting: *Deleted

## 2011-03-01 LAB — TESTOSTERONE, FREE, TOTAL, SHBG: Testosterone: 607.7 ng/dL (ref 250–890)

## 2011-03-01 NOTE — Telephone Encounter (Signed)
Pt has not called to schedule a repeat EGD. Resent the letter again asking him to call us.

## 2011-03-11 ENCOUNTER — Encounter: Payer: Self-pay | Admitting: Gastroenterology

## 2011-03-23 ENCOUNTER — Other Ambulatory Visit: Payer: Self-pay | Admitting: Family Medicine

## 2011-03-29 ENCOUNTER — Telehealth: Payer: Self-pay | Admitting: *Deleted

## 2011-03-29 NOTE — Telephone Encounter (Signed)
lmom for pt to call back. Pt needs to r/s his EGD to ensure healing of previous GI Bleed. We have mad several attempts to get in touch with pt by phone or letter sent on 03/01/11 and another on 03/11/11.

## 2011-04-29 ENCOUNTER — Other Ambulatory Visit: Payer: Self-pay | Admitting: Family Medicine

## 2011-06-09 ENCOUNTER — Other Ambulatory Visit: Payer: Self-pay | Admitting: Family Medicine

## 2011-06-11 ENCOUNTER — Other Ambulatory Visit: Payer: Self-pay | Admitting: Family Medicine

## 2011-07-01 ENCOUNTER — Ambulatory Visit (INDEPENDENT_AMBULATORY_CARE_PROVIDER_SITE_OTHER): Admitting: Family Medicine

## 2011-07-01 ENCOUNTER — Encounter: Payer: Self-pay | Admitting: Family Medicine

## 2011-07-01 VITALS — BP 120/84 | HR 84 | Temp 97.9°F | Wt 182.0 lb

## 2011-07-01 DIAGNOSIS — T148XXA Other injury of unspecified body region, initial encounter: Secondary | ICD-10-CM

## 2011-07-01 DIAGNOSIS — W57XXXA Bitten or stung by nonvenomous insect and other nonvenomous arthropods, initial encounter: Secondary | ICD-10-CM | POA: Insufficient documentation

## 2011-07-01 MED ORDER — MUPIROCIN 2 % EX OINT: TOPICAL_OINTMENT | Freq: Three times a day (TID) | CUTANEOUS | Status: AC

## 2011-07-01 NOTE — Patient Instructions (Addendum)
Good to see you. Let's apply bactroban to your bites 3 times a day. Call me next week with an update.

## 2011-07-01 NOTE — Progress Notes (Signed)
66 yo with known alcoholism here to discuss bug bites.  Not working now, drinking more daily. Not ready to quit or cut back.  Not sure what bit him or where he was when he received multiple bites on his arms and legs. No ticks. Not painful or itchy but several "looked infected."  Afebrile.     ROS:  See HPI No blurred vision No HA No n/v/d  Patient Active Problem List  Diagnosis  . HYPERLIPIDEMIA  . Hyposmolality and/or hyponatremia  . HYPOKALEMIA  . ANXIETY STATE, UNSPECIFIED  . ALCOHOL ABUSE  . UNSPECIFIED HEARING LOSS  . HYPERTENSION  . ALLERGIC RHINITIS, SEASONAL  . BRONCHITIS, ALLERGIC  . ALCOHOLIC CIRRHOSIS OF LIVER  . BENIGN PROSTATIC HYPERTROPHY  . HYDROCELES, BILATERAL  . IMPOTENCE, ORGANIC ORIGIN  . ROSACEA  . LEG PAIN, LEFT  . ORTHOSTATIC DIZZINESS  . MEMORY LOSS  . LEG EDEMA, BILATERAL  . ECCHYMOSES  . SHORTNESS OF BREATH  . NAUSEA  . ABDOMINAL DISTENSION  . OTHER ASCITES  . HYPERGLYCEMIA  . LIVER FUNCTION TESTS, ABNORMAL  . CERVICAL MUSCLE STRAIN  . UNSPECIFIED DEFICIENCY ANEMIA  . BPH (benign prostatic hyperplasia)  . Routine general medical examination at a health care facility  . Special screening for malignant neoplasms, colon  . Colon polyp  . Diverticulosis of colon (without mention of hemorrhage)  . Lower extremity edema  . Gastritis and duodenitis  . Gastric ulcer  . H/O: upper GI bleed  . Stomach ulcer from aspirin/ibuprofen-like drugs (NSAID's)  . Alcohol abuse  . Nocturnal muscle cramps  . Hyponatremia  . Urinary frequency  . Insect bites   Past Medical History  Diagnosis Date  . HLD (hyperlipidemia) 5/99  . HTN (hypertension) 5/99  . BPH (benign prostatic hypertrophy) 5/99  . Pneumonia     hosp  . GERD (gastroesophageal reflux disease) 07/31/04    gastritis   . Anxiety   . Allergy     pollen/ragweed  . Adenomatous polyp of colon 2006  . Hearing difficulty   . Anemia   . Depression    Past Surgical History    Procedure Date  . L duputryen contractive surgery   . Neck mri 6/04    C5-6 disc abnormality  . Colonoscopy 07/31/04    multiple polyps; repeat in 3 years  . Hosp cp r/o'd 2/24 03/08/07  . Ett myoview 03/09/07    nml EF 66%  . Upper gastrointestinal endoscopy   . Tonsillectomy    History  Substance Use Topics  . Smoking status: Never Smoker   . Smokeless tobacco: Never Used  . Alcohol Use: 4.8 oz/week    8 Glasses of wine per week     1 liter a week   Family History  Problem Relation Age of Onset  . Heart disease Father   . Diabetes Father   . Stroke Father   . Depression Mother   . Heart disease Brother   . Alcohol abuse Brother   . Liver disease Brother    Allergies  Allergen Reactions  . Nifedipine     REACTION: SWELLING   Current Outpatient Prescriptions on File Prior to Visit  Medication Sig Dispense Refill  . b complex vitamins capsule Take 1 capsule by mouth daily. 800 mg       . cyclobenzaprine (FLEXERIL) 10 MG tablet TAKE 1 TABLET BY MOUTH 2 TIMES DAILY AS NEEDED.  60 tablet  0  . cyclobenzaprine (FLEXERIL) 10 MG tablet TAKE 1 TABLET (10  MG TOTAL) BY MOUTH 2 (TWO) TIMES DAILY AS NEEDED.  60 tablet  0  . fexofenadine (ALLEGRA) 180 MG tablet Take 180 mg by mouth daily as needed.        . fluticasone (FLONASE) 50 MCG/ACT nasal spray USE 2 SPRAYS IN EACH NOSTRIL DAILY  16 g  6  . Multiple Vitamin (MULTIVITAMIN) tablet Take 1 tablet by mouth daily.        [name] spironolactone (ALDACTONE) 25 MG tablet Take 1 tablet (25 mg total) by mouth daily.  30 tablet  11  . Thiamine HCl (VITAMIN B-1) 100 MG tablet Take 100 mg by mouth daily.        [name] terazosin (HYTRIN) 2 MG capsule Take 1 capsule (2 mg total) by mouth at bedtime.  30 capsule  6   The PMH, PSH, Social History, Family History, Medications, and allergies have been reviewed in [facility], and have been updated if relevant.  Physical exam: BP 120/84  Pulse 84  Temp 97.9 F (36.6 C)  Wt 182 lb (82.555 kg)  General: alert  and well-developed, more color in his face today  Lungs: Normal respiratory effort, chest expands symmetrically. Decreased BS at bases bilaterally, right>left, no wheezing or crackles.  Heart: Normal rate and regular rhythm. S1 and S2 normal without gallop, murmur, click, rub or other extra sounds.  Abdomen: some distension, pos BS, no tenderness, no guarding  Msk: Large echymosis under left patella, no effusionFull range of motions, ankle has normal stability.  Neurologic: alert & oriented X3.  A little shaky, no asterixix  Psych: normally interactive and good eye contact but tearful.  Ext: No edema Skin:  Multiple visible bug bites on left arm, left inner thigh, some with mild surround erythema, no warmth, no abscess, no ticks.  Assessment and Plan:  1. Insect bites   New- with no red flag symptoms. Advised bactroban to bites for next 1-2 weeks. He will call me next week with an update.

## 2011-07-23 ENCOUNTER — Telehealth: Payer: Self-pay

## 2011-07-23 DIAGNOSIS — K469 Unspecified abdominal hernia without obstruction or gangrene: Secondary | ICD-10-CM

## 2011-07-23 NOTE — Telephone Encounter (Signed)
Pt left v/m needs referral to surgeon in our system to do hernia operation as out patient; same day. Left message for pt to call back for more details.

## 2011-07-26 ENCOUNTER — Other Ambulatory Visit: Payer: Self-pay | Admitting: Family Medicine

## 2011-07-27 NOTE — Telephone Encounter (Signed)
Left vm for pt to callback 

## 2011-07-28 NOTE — Telephone Encounter (Signed)
Pt not having any new problems with hernia; pt starts Medicare 08/12/11 pt has decided time to get hernia repair. Pt would like to see surgeon in [place].Pt will wait to hear from pt care coordinator.Please advise.

## 2011-07-28 NOTE — Telephone Encounter (Signed)
Called [name] patients wife and told her to tell [name] that he will need an office visit with Dr Dayton Martes if he has a hernia that she would need to see him in the office first to evaluate the problem before we can do the surgery referral.

## 2011-07-28 NOTE — Telephone Encounter (Signed)
Thank you :)

## 2011-07-28 NOTE — Telephone Encounter (Signed)
Sean Limerick do you know anything about this?

## 2011-08-02 ENCOUNTER — Other Ambulatory Visit: Payer: Self-pay | Admitting: Family Medicine

## 2011-08-03 NOTE — Telephone Encounter (Signed)
meds called to pharmacy on 08/02/11.

## 2011-08-22 ENCOUNTER — Other Ambulatory Visit: Payer: Self-pay | Admitting: Family Medicine

## 2011-08-31 ENCOUNTER — Other Ambulatory Visit: Payer: Self-pay | Admitting: Family Medicine

## 2011-09-25 ENCOUNTER — Other Ambulatory Visit: Payer: Self-pay | Admitting: Family Medicine

## 2011-10-25 ENCOUNTER — Other Ambulatory Visit: Payer: Self-pay | Admitting: Family Medicine

## 2011-11-01 ENCOUNTER — Telehealth: Payer: Self-pay

## 2011-11-01 NOTE — Telephone Encounter (Signed)
Pt is not doing well; feeling weak in legs; pt not taking med. Pt refuses to go to hospital or go to doctor for appt. Pt falls 2-3 times a day every day for the last 1-2 weeks. Pts wife feels pt has given up. Pt has multiple bruises in rib area and arms and legs. Pts wife is not sure if more weakness in legs on one side or the other side, pts speech is sometimes slurred but pt also drinking. Pts wife request call back from Dr Aron.(prefers after 5:30 pm.)Pt's wife is at work until 5:30 and difficult to talk.Please advise.

## 2011-11-02 ENCOUNTER — Emergency Department (HOSPITAL_COMMUNITY): Payer: Medicare Other

## 2011-11-02 ENCOUNTER — Encounter (HOSPITAL_COMMUNITY): Payer: Self-pay | Admitting: Emergency Medicine

## 2011-11-02 ENCOUNTER — Inpatient Hospital Stay (HOSPITAL_COMMUNITY)
Admission: EM | Admit: 2011-11-02 | Discharge: 2011-11-16 | DRG: 492 | Disposition: A | Discharge status: Skilled Nursing Facility | Payer: Medicare Other | Attending: Internal Medicine | Admitting: Internal Medicine

## 2011-11-02 ENCOUNTER — Telehealth: Payer: Self-pay | Admitting: Family Medicine

## 2011-11-02 DIAGNOSIS — H9190 Unspecified hearing loss, unspecified ear: Secondary | ICD-10-CM

## 2011-11-02 DIAGNOSIS — R143 Flatulence: Secondary | ICD-10-CM

## 2011-11-02 DIAGNOSIS — F329 Major depressive disorder, single episode, unspecified: Secondary | ICD-10-CM | POA: Diagnosis present

## 2011-11-02 DIAGNOSIS — Z79899 Other long term (current) drug therapy: Secondary | ICD-10-CM

## 2011-11-02 DIAGNOSIS — Z8719 Personal history of other diseases of the digestive system: Secondary | ICD-10-CM

## 2011-11-02 DIAGNOSIS — R17 Unspecified jaundice: Secondary | ICD-10-CM | POA: Diagnosis not present

## 2011-11-02 DIAGNOSIS — R1310 Dysphagia, unspecified: Secondary | ICD-10-CM | POA: Diagnosis not present

## 2011-11-02 DIAGNOSIS — M79609 Pain in unspecified limb: Secondary | ICD-10-CM

## 2011-11-02 DIAGNOSIS — S42302A Unspecified fracture of shaft of humerus, left arm, initial encounter for closed fracture: Secondary | ICD-10-CM

## 2011-11-02 DIAGNOSIS — R141 Gas pain: Secondary | ICD-10-CM

## 2011-11-02 DIAGNOSIS — D72829 Elevated white blood cell count, unspecified: Secondary | ICD-10-CM | POA: Diagnosis present

## 2011-11-02 DIAGNOSIS — S139XXA Sprain of joints and ligaments of unspecified parts of neck, initial encounter: Secondary | ICD-10-CM

## 2011-11-02 DIAGNOSIS — B952 Enterococcus as the cause of diseases classified elsewhere: Secondary | ICD-10-CM | POA: Diagnosis not present

## 2011-11-02 DIAGNOSIS — F10239 Alcohol dependence with withdrawal, unspecified: Secondary | ICD-10-CM | POA: Diagnosis not present

## 2011-11-02 DIAGNOSIS — E8729 Other acidosis: Secondary | ICD-10-CM

## 2011-11-02 DIAGNOSIS — F411 Generalized anxiety disorder: Secondary | ICD-10-CM

## 2011-11-02 DIAGNOSIS — L719 Rosacea, unspecified: Secondary | ICD-10-CM

## 2011-11-02 DIAGNOSIS — W57XXXA Bitten or stung by nonvenomous insect and other nonvenomous arthropods, initial encounter: Secondary | ICD-10-CM

## 2011-11-02 DIAGNOSIS — K5730 Diverticulosis of large intestine without perforation or abscess without bleeding: Secondary | ICD-10-CM

## 2011-11-02 DIAGNOSIS — S42213A Unspecified displaced fracture of surgical neck of unspecified humerus, initial encounter for closed fracture: Secondary | ICD-10-CM | POA: Diagnosis not present

## 2011-11-02 DIAGNOSIS — N433 Hydrocele, unspecified: Secondary | ICD-10-CM

## 2011-11-02 DIAGNOSIS — F1027 Alcohol dependence with alcohol-induced persisting dementia: Secondary | ICD-10-CM | POA: Diagnosis present

## 2011-11-02 DIAGNOSIS — D62 Acute posthemorrhagic anemia: Secondary | ICD-10-CM | POA: Diagnosis not present

## 2011-11-02 DIAGNOSIS — Z66 Do not resuscitate: Secondary | ICD-10-CM | POA: Diagnosis present

## 2011-11-02 DIAGNOSIS — F10939 Alcohol use, unspecified with withdrawal, unspecified: Secondary | ICD-10-CM | POA: Diagnosis not present

## 2011-11-02 DIAGNOSIS — R42 Dizziness and giddiness: Secondary | ICD-10-CM | POA: Diagnosis not present

## 2011-11-02 DIAGNOSIS — S42202A Unspecified fracture of upper end of left humerus, initial encounter for closed fracture: Secondary | ICD-10-CM | POA: Diagnosis present

## 2011-11-02 DIAGNOSIS — D539 Nutritional anemia, unspecified: Secondary | ICD-10-CM

## 2011-11-02 DIAGNOSIS — Z043 Encounter for examination and observation following other accident: Secondary | ICD-10-CM | POA: Diagnosis not present

## 2011-11-02 DIAGNOSIS — J95821 Acute postprocedural respiratory failure: Secondary | ICD-10-CM

## 2011-11-02 DIAGNOSIS — E872 Acidosis, unspecified: Secondary | ICD-10-CM | POA: Diagnosis present

## 2011-11-02 DIAGNOSIS — K635 Polyp of colon: Secondary | ICD-10-CM

## 2011-11-02 DIAGNOSIS — F3289 Other specified depressive episodes: Secondary | ICD-10-CM | POA: Diagnosis present

## 2011-11-02 DIAGNOSIS — R188 Other ascites: Secondary | ICD-10-CM

## 2011-11-02 DIAGNOSIS — J42 Unspecified chronic bronchitis: Secondary | ICD-10-CM | POA: Diagnosis present

## 2011-11-02 DIAGNOSIS — R52 Pain, unspecified: Secondary | ICD-10-CM | POA: Diagnosis not present

## 2011-11-02 DIAGNOSIS — E785 Hyperlipidemia, unspecified: Secondary | ICD-10-CM

## 2011-11-02 DIAGNOSIS — R7309 Other abnormal glucose: Secondary | ICD-10-CM

## 2011-11-02 DIAGNOSIS — Z1211 Encounter for screening for malignant neoplasm of colon: Secondary | ICD-10-CM

## 2011-11-02 DIAGNOSIS — N390 Urinary tract infection, site not specified: Secondary | ICD-10-CM | POA: Diagnosis not present

## 2011-11-02 DIAGNOSIS — R252 Cramp and spasm: Secondary | ICD-10-CM

## 2011-11-02 DIAGNOSIS — K219 Gastro-esophageal reflux disease without esophagitis: Secondary | ICD-10-CM | POA: Diagnosis present

## 2011-11-02 DIAGNOSIS — Z0000 Encounter for general adult medical examination without abnormal findings: Secondary | ICD-10-CM

## 2011-11-02 DIAGNOSIS — Y92009 Unspecified place in unspecified non-institutional (private) residence as the place of occurrence of the external cause: Secondary | ICD-10-CM

## 2011-11-02 DIAGNOSIS — E871 Hypo-osmolality and hyponatremia: Secondary | ICD-10-CM | POA: Diagnosis present

## 2011-11-02 DIAGNOSIS — F1010 Alcohol abuse, uncomplicated: Secondary | ICD-10-CM | POA: Diagnosis present

## 2011-11-02 DIAGNOSIS — R0602 Shortness of breath: Secondary | ICD-10-CM

## 2011-11-02 DIAGNOSIS — M25519 Pain in unspecified shoulder: Secondary | ICD-10-CM | POA: Diagnosis not present

## 2011-11-02 DIAGNOSIS — S6990XA Unspecified injury of unspecified wrist, hand and finger(s), initial encounter: Secondary | ICD-10-CM | POA: Diagnosis not present

## 2011-11-02 DIAGNOSIS — R350 Frequency of micturition: Secondary | ICD-10-CM

## 2011-11-02 DIAGNOSIS — E46 Unspecified protein-calorie malnutrition: Secondary | ICD-10-CM | POA: Diagnosis present

## 2011-11-02 DIAGNOSIS — R413 Other amnesia: Secondary | ICD-10-CM

## 2011-11-02 DIAGNOSIS — F10931 Alcohol use, unspecified with withdrawal delirium: Secondary | ICD-10-CM

## 2011-11-02 DIAGNOSIS — K7030 Alcoholic cirrhosis of liver without ascites: Secondary | ICD-10-CM

## 2011-11-02 DIAGNOSIS — E876 Hypokalemia: Secondary | ICD-10-CM

## 2011-11-02 DIAGNOSIS — N529 Male erectile dysfunction, unspecified: Secondary | ICD-10-CM

## 2011-11-02 DIAGNOSIS — N400 Enlarged prostate without lower urinary tract symptoms: Secondary | ICD-10-CM

## 2011-11-02 DIAGNOSIS — R110 Nausea: Secondary | ICD-10-CM

## 2011-11-02 DIAGNOSIS — J301 Allergic rhinitis due to pollen: Secondary | ICD-10-CM

## 2011-11-02 DIAGNOSIS — F10231 Alcohol dependence with withdrawal delirium: Secondary | ICD-10-CM

## 2011-11-02 DIAGNOSIS — I10 Essential (primary) hypertension: Secondary | ICD-10-CM | POA: Diagnosis present

## 2011-11-02 DIAGNOSIS — R945 Abnormal results of liver function studies: Secondary | ICD-10-CM

## 2011-11-02 DIAGNOSIS — F1020 Alcohol dependence, uncomplicated: Secondary | ICD-10-CM

## 2011-11-02 DIAGNOSIS — J45909 Unspecified asthma, uncomplicated: Secondary | ICD-10-CM

## 2011-11-02 DIAGNOSIS — R296 Repeated falls: Secondary | ICD-10-CM

## 2011-11-02 DIAGNOSIS — S42209A Unspecified fracture of upper end of unspecified humerus, initial encounter for closed fracture: Secondary | ICD-10-CM

## 2011-11-02 DIAGNOSIS — Z9181 History of falling: Secondary | ICD-10-CM

## 2011-11-02 DIAGNOSIS — W010XXA Fall on same level from slipping, tripping and stumbling without subsequent striking against object, initial encounter: Secondary | ICD-10-CM | POA: Diagnosis present

## 2011-11-02 DIAGNOSIS — R609 Edema, unspecified: Secondary | ICD-10-CM

## 2011-11-02 DIAGNOSIS — S59919A Unspecified injury of unspecified forearm, initial encounter: Secondary | ICD-10-CM | POA: Diagnosis not present

## 2011-11-02 DIAGNOSIS — R238 Other skin changes: Secondary | ICD-10-CM

## 2011-11-02 DIAGNOSIS — R600 Localized edema: Secondary | ICD-10-CM

## 2011-11-02 HISTORY — DX: Umbilical hernia without obstruction or gangrene: K42.9

## 2011-11-02 HISTORY — DX: Personal history of other medical treatment: Z92.89

## 2011-11-02 HISTORY — DX: Personal history of other diseases of the digestive system: Z87.19

## 2011-11-02 HISTORY — DX: Peripheral vascular disease, unspecified: I73.9

## 2011-11-02 HISTORY — DX: Unspecified chronic bronchitis: J42

## 2011-11-02 HISTORY — DX: Repeated falls: R29.6

## 2011-11-02 LAB — LACTIC ACID, PLASMA: Lactic Acid, Venous: 4.6 mmol/L — ABNORMAL HIGH (ref 0.5–2.2)

## 2011-11-02 LAB — SURGICAL PCR SCREEN
MRSA, PCR: NEGATIVE
Staphylococcus aureus: NEGATIVE

## 2011-11-02 LAB — CBC WITH DIFFERENTIAL/PLATELET
Basophils Absolute: 0.1 10*3/uL (ref 0.0–0.1)
Eosinophils Relative: 0 % (ref 0–5)
Lymphocytes Relative: 5 % — ABNORMAL LOW (ref 12–46)
Lymphs Abs: 0.7 10*3/uL (ref 0.7–4.0)
MCV: 92.8 fL (ref 78.0–100.0)
Neutrophils Relative %: 86 % — ABNORMAL HIGH (ref 43–77)
Platelets: 149 10*3/uL — ABNORMAL LOW (ref 150–400)
RBC: 3.9 MIL/uL — ABNORMAL LOW (ref 4.22–5.81)
RDW: 14.6 % (ref 11.5–15.5)
WBC: 15.4 10*3/uL — ABNORMAL HIGH (ref 4.0–10.5)

## 2011-11-02 LAB — POCT I-STAT, CHEM 8
Calcium, Ion: 0.85 mmol/L — ABNORMAL LOW (ref 1.13–1.30)
HCT: 41 % (ref 39.0–52.0)
Hemoglobin: 13.9 g/dL (ref 13.0–17.0)
Sodium: 129 mEq/L — ABNORMAL LOW (ref 135–145)
TCO2: 22 mmol/L (ref 0–100)

## 2011-11-02 LAB — CK: Total CK: 290 U/L — ABNORMAL HIGH (ref 7–232)

## 2011-11-02 MED ORDER — PNEUMOCOCCAL VAC POLYVALENT 25 MCG/0.5ML IJ INJ
0.5000 mL | INJECTION | INTRAMUSCULAR | Status: AC
  Filled 2011-11-02: qty 0.5

## 2011-11-02 MED ORDER — SODIUM CHLORIDE 0.9 % IJ SOLN
3.0000 mL | Freq: Two times a day (BID) | INTRAMUSCULAR | Status: DC
  Administered 2011-11-02: 3 mL via INTRAVENOUS

## 2011-11-02 MED ORDER — INFLUENZA VIRUS VACC SPLIT PF IM SUSP
0.5000 mL | INTRAMUSCULAR | Status: AC
  Filled 2011-11-02: qty 0.5

## 2011-11-02 MED ORDER — PANTOPRAZOLE SODIUM 40 MG PO TBEC
40.0000 mg | DELAYED_RELEASE_TABLET | Freq: Two times a day (BID) | ORAL | Status: DC
  Administered 2011-11-02 – 2011-11-04 (×4): 40 mg via ORAL
  Filled 2011-11-02 (×4): qty 1

## 2011-11-02 MED ORDER — SODIUM CHLORIDE 0.9 % IV SOLN
INTRAVENOUS | Status: DC
  Administered 2011-11-02: 17:00:00 via INTRAVENOUS

## 2011-11-02 MED ORDER — ONDANSETRON HCL 4 MG/2ML IJ SOLN
4.0000 mg | Freq: Once | INTRAMUSCULAR | Status: AC
  Administered 2011-11-02: 4 mg via INTRAVENOUS
  Filled 2011-11-02: qty 2

## 2011-11-02 MED ORDER — ONDANSETRON HCL 4 MG/2ML IJ SOLN
4.0000 mg | Freq: Once | INTRAMUSCULAR | Status: AC
  Administered 2011-11-02: 4 mg via INTRAVENOUS

## 2011-11-02 MED ORDER — ADULT MULTIVITAMIN W/MINERALS CH
1.0000 | ORAL_TABLET | Freq: Every day | ORAL | Status: DC
  Administered 2011-11-02 – 2011-11-03 (×2): 1 via ORAL
  Filled 2011-11-02 (×3): qty 1

## 2011-11-02 MED ORDER — LORAZEPAM 2 MG/ML IJ SOLN
1.0000 mg | Freq: Four times a day (QID) | INTRAMUSCULAR | Status: DC | PRN
  Administered 2011-11-02 – 2011-11-04 (×4): 1 mg via INTRAVENOUS
  Filled 2011-11-02 (×5): qty 1

## 2011-11-02 MED ORDER — FLUTICASONE PROPIONATE 50 MCG/ACT NA SUSP
2.0000 | NASAL | Status: DC | PRN
Start: 2011-11-02 — End: 2011-11-05
  Filled 2011-11-02: qty 16

## 2011-11-02 MED ORDER — ONDANSETRON HCL 4 MG/2ML IJ SOLN
INTRAMUSCULAR | Status: AC
  Filled 2011-11-02: qty 2

## 2011-11-02 MED ORDER — HYDRALAZINE HCL 20 MG/ML IJ SOLN
4.0000 mg | INTRAMUSCULAR | Status: DC | PRN
Start: 2011-11-02 — End: 2011-11-10
  Administered 2011-11-02: 4 mg via INTRAVENOUS
  Filled 2011-11-02: qty 0.2

## 2011-11-02 MED ORDER — LORAZEPAM 1 MG PO TABS
1.0000 mg | ORAL_TABLET | Freq: Four times a day (QID) | ORAL | Status: DC | PRN
  Administered 2011-11-02 – 2011-11-03 (×2): 1 mg via ORAL
  Filled 2011-11-02 (×2): qty 1

## 2011-11-02 MED ORDER — FOLIC ACID 1 MG PO TABS
1.0000 mg | ORAL_TABLET | Freq: Every day | ORAL | Status: DC
  Administered 2011-11-02 – 2011-11-04 (×3): 1 mg via ORAL
  Filled 2011-11-02 (×3): qty 1

## 2011-11-02 MED ORDER — SODIUM CHLORIDE 0.9 % IV BOLUS (SEPSIS): 1000.0000 mL | Freq: Once | INTRAVENOUS | Status: DC

## 2011-11-02 MED ORDER — MORPHINE SULFATE 2 MG/ML IJ SOLN
1.0000 mg | INTRAMUSCULAR | Status: DC | PRN
  Administered 2011-11-02 – 2011-11-04 (×10): 1 mg via INTRAVENOUS
  Filled 2011-11-02 (×11): qty 1

## 2011-11-02 MED ORDER — VITAMIN B-1 100 MG PO TABS
100.0000 mg | ORAL_TABLET | Freq: Every day | ORAL | Status: DC
  Administered 2011-11-02 – 2011-11-03 (×2): 100 mg via ORAL
  Filled 2011-11-02 (×3): qty 1

## 2011-11-02 MED ORDER — FENTANYL CITRATE 0.05 MG/ML IJ SOLN
100.0000 ug | Freq: Once | INTRAMUSCULAR | Status: AC
  Administered 2011-11-02: 100 ug via INTRAVENOUS
  Filled 2011-11-02: qty 2

## 2011-11-02 MED ORDER — THIAMINE HCL 100 MG/ML IJ SOLN
100.0000 mg | Freq: Every day | INTRAMUSCULAR | Status: DC
  Administered 2011-11-04: 100 mg via INTRAVENOUS
  Administered 2011-11-05: 10:00:00 via INTRAVENOUS
  Filled 2011-11-02 (×4): qty 1

## 2011-11-02 MED ORDER — TERAZOSIN HCL 2 MG PO CAPS
2.0000 mg | ORAL_CAPSULE | Freq: Every day | ORAL | Status: DC
  Administered 2011-11-02 – 2011-11-03 (×2): 2 mg via ORAL
  Filled 2011-11-02 (×3): qty 1

## 2011-11-02 MED ORDER — CYCLOBENZAPRINE HCL 10 MG PO TABS
10.0000 mg | ORAL_TABLET | Freq: Two times a day (BID) | ORAL | Status: DC | PRN
  Administered 2011-11-02 – 2011-11-04 (×4): 10 mg via ORAL
  Filled 2011-11-02 (×4): qty 1

## 2011-11-02 MED ORDER — HYDROMORPHONE HCL PF 1 MG/ML IJ SOLN
1.0000 mg | Freq: Once | INTRAMUSCULAR | Status: AC
  Administered 2011-11-02: 1 mg via INTRAVENOUS
  Filled 2011-11-02: qty 1

## 2011-11-02 MED ORDER — OXYCODONE HCL 5 MG PO TABS
5.0000 mg | ORAL_TABLET | ORAL | Status: DC | PRN
  Administered 2011-11-03 (×3): 5 mg via ORAL
  Filled 2011-11-02 (×3): qty 1

## 2011-11-02 MED ORDER — ONDANSETRON HCL 4 MG/2ML IJ SOLN: 4.0000 mg | Freq: Four times a day (QID) | INTRAMUSCULAR | Status: DC | PRN

## 2011-11-02 MED ORDER — PROMETHAZINE HCL 25 MG/ML IJ SOLN
12.5000 mg | INTRAMUSCULAR | Status: DC | PRN
  Administered 2011-11-02 – 2011-11-03 (×2): 12.5 mg via INTRAVENOUS
  Filled 2011-11-02 (×2): qty 1

## 2011-11-02 MED ORDER — SODIUM CHLORIDE 0.9 % IV BOLUS (SEPSIS)
1000.0000 mL | Freq: Once | INTRAVENOUS | Status: AC
  Administered 2011-11-02: 1000 mL via INTRAVENOUS

## 2011-11-02 NOTE — ED Notes (Signed)
Pt transported to CT ?

## 2011-11-02 NOTE — ED Notes (Signed)
Pt moaning in pain, moving head side to side. Sighing constantly. Pt given pain medicine.

## 2011-11-02 NOTE — Telephone Encounter (Signed)
Called patient's wife.  She was unable to talk at the time that I called.  I will try again this evening.

## 2011-11-02 NOTE — Consult Note (Signed)
Or[facility]opaedic Trauma Service Consult  Pt seen and examined Full consult to follow  Wife at bedside to provide additional info  Brief HPI:  66 y/o LHD caucasian male with hx notable for significant EtOH use and multiple daily falls, sustained a fall early this am. Brought to [facility] hospital and found to have L proximal humerus fx as well as electrolyte abnormalities.  Wife reports severe balance issues over last several months. Has even bought a walker for pt  Exam  Gen: pt has difficult speaking, smells of alcohol and appears intoxicated HEENT: pupils constricted and sclera are injected, membranes are dry Lungs: decreased Cardiac: tachy but reg Abd: distended, + BS, NT Ext:  Left upper extremity   Sling in place   Covered abrasion to L elbow (this happed about a week ago and pt picks at it)   + deformity to L shoulder   Axillary, medial, radial, ulnar sensation intact   R/U/M motor intact   No significant swelling   + Radial pulse   Pt with tremor Neuro: + tremor B UEx, when pt speaks he minimizes his drinking and frequency of falls   Xray  Comminuted L proximal humeurs fx  A/P  66 y/o LHD male s/p ground level fall due to chronic EtOH use  1. Ground Level Fall 2. Chronic EtOH Abuse 3. L proximal humerus fracture  Continue with sling  Check CT scan  Ice   Ok to move digits and elbow prn  Probable that we can treat non-op  Very concerned given EtOH use and h/o regular falls in [place] of surgical tx. Do not believe pt is a good surgical candidate. However, could have good outcome with non-op treatment  Pt only out of bed with assistance  4. Balance issues/frequent falls  Related to 2  Check for vitamin deficiencies particularly in the setting of #2, defer to primary  ? Check ammonia levels 5.medical issues  Per primary 5. Dispo   Admit to medicine  Check CT L shoulder  Probable non-op management  Mearl Latin, PA-C [facility] [phone]  (P) 11/02/2011 1:50 PM

## 2011-11-02 NOTE — ED Notes (Signed)
Vega, MD at bedside. 

## 2011-11-02 NOTE — Progress Notes (Signed)
Orthopedic Tech Progress Note Patient Details:  [name] 02/14/1945 [id number] Arm sling applied to Left UE. Patient in pain, nurse stated she had just administered more pain meds to patient. Ortho Devices Type of Ortho Device: Arm foam sling Ortho Device/Splint Location: Left UE Ortho Device/Splint Interventions: Application   Asia R Thompson 11/02/2011, 12:21 PM

## 2011-11-02 NOTE — ED Notes (Signed)
Pt moving to CDU per Dr. Radford Pax.

## 2011-11-02 NOTE — ED Provider Notes (Addendum)
History     CSN: [id number]  Arrival date & time 11/02/11  1191   First MD Initiated Contact with Patient 11/02/11 [phone]      Chief Complaint  Patient presents with  . Shoulder Pain    patient fell this a.m. at 0100 and has obvious deformity to L humerous.     HPI Per EMS report. Patient fell approximately 0100 after drinking wine. Patient states "I only drank 2 glasses of wine". Per EMS, patient was by himself at home and laid in the floor all night until this morning. A family member called EMS after calling patient to check up on him. Patient advised them at the time of the call that he had fallen. EMS states that the L shoulder has obvious deformity. Patient claims that he has been having dizzy spells for a couple of weeks.  Past Medical History  Diagnosis Date  . HLD (hyperlipidemia) 5/99  . HTN (hypertension) 5/99  . BPH (benign prostatic hypertrophy) 5/99  . Pneumonia     hosp  . GERD (gastroesophageal reflux disease) 07/31/04    gastritis   . Anxiety   . Allergy     pollen/ragweed  . Adenomatous polyp of colon 2006  . Hearing difficulty   . Anemia   . Depression     Past Surgical History  Procedure Date  . L duputryen contractive surgery   . Neck mri 6/04    C5-6 disc abnormality  . Colonoscopy 07/31/04    multiple polyps; repeat in 3 years  . Hosp cp r/o'd 2/24 03/08/07  . Ett myoview 03/09/07    nml EF 66%  . Upper gastrointestinal endoscopy   . Tonsillectomy     Family History  Problem Relation Age of Onset  . Heart disease Father   . Diabetes Father   . Stroke Father   . Depression Mother   . Heart disease Brother   . Alcohol abuse Brother   . Liver disease Brother     History  Substance Use Topics  . Smoking status: Never Smoker   . Smokeless tobacco: Never Used  . Alcohol Use: 4.8 oz/week    8 Glasses of wine per week     1 liter a week      Review of Systems  All other systems reviewed and are negative.    Allergies   Nifedipine  Home Medications   Current Outpatient Rx  Name Route Sig Dispense Refill  . B COMPLEX VITAMINS PO CAPS Oral Take 1 capsule by mouth daily.     . CYCLOBENZAPRINE HCL 10 MG PO TABS Oral Take 10 mg by mouth 2 (two) times daily as needed. For spasms    . FEXOFENADINE HCL 180 MG PO TABS Oral Take 180 mg by mouth daily as needed. For allergies    . FLUTICASONE PROPIONATE 50 MCG/ACT NA SUSP Nasal Place 2 sprays into the nose as needed. For allergies    . IBUPROFEN 200 MG PO TABS Oral Take 200 mg by mouth every 6 (six) hours as needed. For pain    . ADULT MULTIVITAMIN W/MINERALS CH Oral Take 1 tablet by mouth daily.    [name] PANTOPRAZOLE SODIUM 40 MG PO TBEC Oral Take 40 mg by mouth 2 (two) times daily.    [name] PROMETHAZINE HCL 25 MG PO TABS Oral Take 25 mg by mouth every 6 (six) hours as needed. For nausea    . SPIRONOLACTONE 25 MG PO TABS Oral Take 1 tablet (25 mg  total) by mouth daily. 30 tablet 11  . TERAZOSIN HCL 2 MG PO CAPS Oral Take 1 capsule (2 mg total) by mouth at bedtime. 30 capsule 6  . VITAMIN B-1 100 MG PO TABS Oral Take 100 mg by mouth daily.       BP 112/75  Pulse 118  Temp 97.8 F (36.6 C) (Oral)  Resp 20  Ht 5\' 9"  (1.753 m)  Wt 180 lb (81.647 kg)  BMI 26.58 kg/m2  SpO2 99%  Physical Exam  Nursing note and vitals reviewed. Constitutional: He is oriented to person, place, and time. He appears well-developed and well-nourished. No distress.  HENT:  Head: Normocephalic and atraumatic.  Eyes: Pupils are equal, round, and reactive to light.  Neck: Normal range of motion.  Cardiovascular: Normal rate and intact distal pulses.   Pulmonary/Chest: No respiratory distress.  Abdominal: Normal appearance. He exhibits no distension.  Musculoskeletal:       Left shoulder: He exhibits decreased range of motion, tenderness, bony tenderness, deformity and pain. He exhibits normal pulse.  Neurological: He is alert and oriented to person, place, and time. No cranial nerve  deficit.  Skin: Skin is warm and dry. No rash noted.  Psychiatric: He has a normal mood and affect. His behavior is normal.    ED Course  Procedures (including critical care time)  Labs Reviewed  POCT I-STAT, CHEM 8 - Abnormal; Notable for the following:    Sodium 129 (*)     Chloride 91 (*)     BUN 4 (*)     Glucose, Bld 107 (*)     Calcium, Ion 0.85 (*)     All other components within normal limits  CBC WITH DIFFERENTIAL - Abnormal; Notable for the following:    WBC 15.4 (*)     RBC 3.90 (*)     HCT 36.2 (*)     MCHC 36.2 (*)     Platelets 149 (*)     Neutrophils Relative 86 (*)     Neutro Abs 13.2 (*)     Lymphocytes Relative 5 (*)     Monocytes Absolute 1.4 (*)     All other components within normal limits  CK - Abnormal; Notable for the following:    Total CK 290 (*)     All other components within normal limits  LACTIC ACID, PLASMA - Abnormal; Notable for the following:    Lactic Acid, Venous 4.6 (*)     All other components within normal limits   Dg Chest 1 View  11/02/2011  *RADIOLOGY REPORT*  Clinical Data: Fall, left humerus deformity  CHEST - 1 VIEW  Comparison: Shoulder films 10/22/ 2013, chest film 09/07/2010  Findings: Left humeral head fracture noted.  Normal cardiac silhouette. The ascending aorta is ectatic.  No effusion, infiltrate, or pneumothorax.  IMPRESSION: No acute cardiopulmonary process.  Ectatic aorta.   Original Report Authenticated By: Genevive Bi, M.D.    Dg Forearm Left  11/02/2011  *RADIOLOGY REPORT*  Clinical Data: Fall  LEFT FOREARM - 2 VIEW  Comparison: None.  Findings: Two views of the left forearm submitted.  No acute fracture or subluxation.  Diffuse osteopenia.  IMPRESSION: No acute fracture or subluxation.   Original Report Authenticated By: Natasha Mead, M.D.    Ct Shoulder Left Wo Contrast  11/02/2011  *RADIOLOGY REPORT*  Clinical Data: proximal humerus fx  CT OF THE LEFT SHOULDER WITHOUT CONTRAST  Technique:  Multidetector CT  imaging was performed according to the standard  protocol. Multiplanar CT image reconstructions were also generated.  Comparison: 11/02/2011  Findings: Left proximal humeral fracture noted with approximately 2 cm of primarily anterior displacement of the distal fracture fragment back to the humeral head, and with comminution along the dominant fracture plane as well as 1.5 cm of overlap.  Nondisplaced fracture extends through the midportion of the greater tuberosity. No lesser tuberosity fracture observed.  The long head of the biceps in the bicipital groove extends adjacent to various fracture fragments but does not appear to be entrapped in a fracture plane. One of the fragments does potentially slightly impinge on the distal long head of the biceps tendon on image 88 of series 2.  The humeral head does articulate with the glenoid and is mildly internally rotated.  Expected surrounding soft tissue hematoma noted particularly apparent along the anterior portion the subscapularis and tracking in the right axilla and along the short head of the biceps.  There is a tiny linear calcification along the origin of the short head of the biceps but I doubt that the short head is avulsed.  The acromial undersurface is type 2 (curved).  There is likely some intramuscular hematoma/edema in the anterior deltoid.  IMPRESSION:  1.  Three-part proximal humeral fracture involving the surgical neck and greater tuberosity, with 2 cm of displacement and about 1.5 cm of overlap, and with hematoma tracking along the anterior musculature including the deltoid, short head of the biceps, and along the anterior margin of the subscapularis.   Original Report Authenticated By: Dellia Cloud, M.D.    Dg Humerus Left  11/02/2011  *RADIOLOGY REPORT*  Clinical Data: Fall  LEFT HUMERUS - 2+ VIEW  Comparison: None.  Findings: Three views of the left humerus submitted.  There is displaced subcapital fracture of the left humeral neck.   IMPRESSION: Displaced fracture of proximal left humerus.   Original Report Authenticated By: Natasha Mead, M.D.      1. Chronic alcoholism   2. Repeated falls   3. Fracture of proximal humerus   4. High anion gap metabolic acidosis   5. Alcohol abuse   6. Fracture of left humerus   7. Hyponatremia       MDM  Patient will be admitted to the hospitalist service        Nelia Shi, MD 11/02/11 1526  Nelia Shi, MD 11/18/11 1122

## 2011-11-02 NOTE — Telephone Encounter (Signed)
I'm very sorry to hear that but I am glad he is going to the hospital.  I will follow along in epic.

## 2011-11-02 NOTE — H&P (Signed)
[facility] History and Physical  [name] ZOX:[id number] DOB: 10/01/1945 DOA: 11/02/2011  Referring physician: Dr. Radford Pax PCP: Ruthe Mannan, MD  Specialists: Orthopaedic Surgeons  Chief Complaint: Left arm shoulder discomfort after fall  HPI: [name] is a 66 y.o. male  With history of alcoholism that presents to the ED after fall this morning.  Patient is poor historian and reports that he slid on the wood floors of his home on his left side.  Subsequently developed Left arm/shoulder discomfort.  The pain has been persistent and present since fall.  Keeping it still makes the pain less and moving his arm makes his pain worse.  Pain is characterized as sharp.    While in the ED patient was given dilaudid, fentanyl, zofran, and 1 liter NS bolus. ED x rays were obtained which showed a displaced fracture of his left humerus. Subsequently Orthopaedic surgeons were called for further evaluation.  His lab work in the ED showed a anion gap of 16, hyponatremia at 129, CO2 of 22, chloride of 91, total CK of 290.  His WBC was elevated at 15 as well.    We were consulted for admission orders due to metabolite abnormalities.  Review of Systems: The patient denies anorexia, fever, weight loss,, vision loss, decreased hearing, hoarseness, chest pain, syncope, dyspnea on exertion, peripheral edema, hemoptysis, abdominal pain, melena, hematochezia, severe indigestion/heartburn, hematuria, incontinence, genital sores, muscle weakness, suspicious skin lesions, transient blindness, difficulty walking, depression, unusual weight change, abnormal bleeding, enlarged lymph nodes, angioedema, and breast masses.    Past Medical History  Diagnosis Date  . HLD (hyperlipidemia) 5/99  . HTN (hypertension) 5/99  . BPH (benign prostatic hypertrophy) 5/99  . Pneumonia     hosp  . GERD (gastroesophageal reflux disease) 07/31/04    gastritis   . Anxiety   . Allergy     pollen/ragweed  . Adenomatous  polyp of colon 2006  . Hearing difficulty   . Anemia   . Depression    Past Surgical History  Procedure Date  . L duputryen contractive surgery   . Neck mri 6/04    C5-6 disc abnormality  . Colonoscopy 07/31/04    multiple polyps; repeat in 3 years  . Hosp cp r/o'd 2/24 03/08/07  . Ett myoview 03/09/07    nml EF 66%  . Upper gastrointestinal endoscopy   . Tonsillectomy    Social History:  reports that he has never smoked. He has never used smokeless tobacco. He reports that he drinks about 4.8 ounces of alcohol per week. He reports that he does not use illicit drugs. Lives at home   Allergies  Allergen Reactions  . Nifedipine     REACTION: SWELLING    Family History  Problem Relation Age of Onset  . Heart disease Father   . Diabetes Father   . Stroke Father   . Depression Mother   . Heart disease Brother   . Alcohol abuse Brother   . Liver disease Brother   None other reported when directly asked.  Prior to Admission medications   Medication Sig Start Date End Date Taking? Authorizing Provider  b complex vitamins capsule Take 1 capsule by mouth daily.    Yes Historical Provider, MD  cyclobenzaprine (FLEXERIL) 10 MG tablet Take 10 mg by mouth 2 (two) times daily as needed. For spasms   Yes Historical Provider, MD  fexofenadine (ALLEGRA) 180 MG tablet Take 180 mg by mouth daily as needed. For allergies  Yes Historical Provider, MD  fluticasone (FLONASE) 50 MCG/ACT nasal spray Place 2 sprays into the nose as needed. For allergies   Yes Historical Provider, MD  ibuprofen (ADVIL,MOTRIN) 200 MG tablet Take 200 mg by mouth every 6 (six) hours as needed. For pain   Yes Historical Provider, MD  Multiple Vitamin (MULTIVITAMIN WITH MINERALS) TABS Take 1 tablet by mouth daily.   Yes Historical Provider, MD  pantoprazole (PROTONIX) 40 MG tablet Take 40 mg by mouth 2 (two) times daily.   Yes Historical Provider, MD  promethazine (PHENERGAN) 25 MG tablet Take 25 mg by mouth every 6  (six) hours as needed. For nausea   Yes Historical Provider, MD  spironolactone (ALDACTONE) 25 MG tablet Take 1 tablet (25 mg total) by mouth daily. 02/22/11  Yes Dianne Dun, MD  terazosin (HYTRIN) 2 MG capsule Take 1 capsule (2 mg total) by mouth at bedtime. 02/23/11 02/23/12 Yes Dianne Dun, MD  Thiamine HCl (VITAMIN B-1) 100 MG tablet Take 100 mg by mouth daily.    Yes Historical Provider, MD   Physical Exam: Filed Vitals:   11/02/11 0947 11/02/11 1106 11/02/11 1211 11/02/11 1345  BP: 134/91 143/90 139/97 140/96  Pulse: 77 107 108 114  Temp: 98.9 F (37.2 C)     TempSrc: Oral     Resp: 18 20 20 20   Height: 5\' 9"  (1.753 m)     Weight: 81.647 kg (180 lb)     SpO2: 98% 100% 97% 99%     General:  Pt in NAD, keeping left arm still in sling  Eyes: EOMI, PERRLA  ENT: normal exterior appearance, dry mucus membranes  Neck: supple, no goiter  Cardiovascular: RRR, No MRG  Respiratory: CTA BL, no wheezes or increased WOB  Abdomen: soft, ND, obese  Skin: warm and dry  Musculoskeletal: As above left arm held still in sling. Pain with palpation over left arm, no cyanosis  Psychiatric: Flat affect  Neurologic: answers questions appropriately.  Labs on Admission:  Basic Metabolic Panel:  Lab 11/02/11 1610  NA 129*  K 3.5  CL 91*  CO2 --  GLUCOSE 107*  BUN 4*  CREATININE 1.20  CALCIUM --  MG --  PHOS --   Liver Function Tests: No results found for this basename: AST:5,ALT:5,ALKPHOS:5,BILITOT:5,PROT:5,ALBUMIN:5 in the last 168 hours No results found for this basename: LIPASE:5,AMYLASE:5 in the last 168 hours No results found for this basename: AMMONIA:5 in the last 168 hours CBC:  Lab 11/02/11 1116 11/02/11 1022  WBC 15.4* --  NEUTROABS 13.2* --  HGB 13.1 13.9  HCT 36.2* 41.0  MCV 92.8 --  PLT 149* --   Cardiac Enzymes:  Lab 11/02/11 1157  CKTOTAL 290*  CKMB --  CKMBINDEX --  TROPONINI --    BNP (last 3 results) No results found for this basename:  PROBNP:3 in the last 8760 hours CBG: No results found for this basename: GLUCAP:5 in the last 168 hours  Radiological Exams on Admission: Dg Chest 1 View  11/02/2011  *RADIOLOGY REPORT*  Clinical Data: Fall, left humerus deformity  CHEST - 1 VIEW  Comparison: Shoulder films 10/22/ 2013, chest film 09/07/2010  Findings: Left humeral head fracture noted.  Normal cardiac silhouette. The ascending aorta is ectatic.  No effusion, infiltrate, or pneumothorax.  IMPRESSION: No acute cardiopulmonary process.  Ectatic aorta.   Original Report Authenticated By: Genevive Bi, M.D.    Dg Forearm Left  11/02/2011  *RADIOLOGY REPORT*  Clinical Data: Fall  LEFT FOREARM -  2 VIEW  Comparison: None.  Findings: Two views of the left forearm submitted.  No acute fracture or subluxation.  Diffuse osteopenia.  IMPRESSION: No acute fracture or subluxation.   Original Report Authenticated By: Natasha Mead, M.D.    Dg Humerus Left  11/02/2011  *RADIOLOGY REPORT*  Clinical Data: Fall  LEFT HUMERUS - 2+ VIEW  Comparison: None.  Findings: Three views of the left humerus submitted.  There is displaced subcapital fracture of the left humeral neck.  IMPRESSION: Displaced fracture of proximal left humerus.   Original Report Authenticated By: Natasha Mead, M.D.     EKG: None ordered on presentation  Assessment/Plan Active Problems:  HYPERTENSION  Hyponatremia  Fracture of left humerus  Metabolic acidosis Alcohol abuse   1. Fracture of left humerus - Orthopaedic surgeons on board and will manage. - Ortho considering non surgical management - CT of left shoulder w/o contrast ordered - Fall precautions - physical therapy evaluation.  2. Metabolic Acidosis - Most likely related to Alcohol Abuse - Provide IVF with normal saline and reassess next am  3. Leukocytosis - likely stress reaction with demargination of WBC - Patient afebrile will not start antibiotics at this juncture.  4. Hyponatremia - Most likely  related to poor oral solute intake and alcohol abuse. - IVF with normal saline and reassess  5. Alcohol abuse - CIWA protocol - monitor overnight   Code Status: DNR patient does not wish for chest compressions or respirator  Family Communication: None at bedside Disposition Plan: Likely medically stable in the next 1-2 days  Time spent: > 55 minutes  Penny Pia [facility] Pager [phone]  If 7PM-7AM, please contact night-coverage [email or web address] Password [email or web address] 11/02/2011, 1:52 PM     /

## 2011-11-02 NOTE — ED Notes (Signed)
Per EMS report.  Patient fell approximately 0100 after drinking wine.  Patient states "I only drank 2 glasses of wine".   Per EMS, patient was by himself at home and laid in the floor all night until this morning.  A family member called EMS after calling patient to check up on him.   Patient advised them at the time of the call that he had fallen.   EMS states that the L shoulder has obvious deformity.   Patient claims that he has been having dizzy spells for a couple of weeks.

## 2011-11-02 NOTE — ED Notes (Signed)
Orthopedic surgeon PA at bedside.

## 2011-11-02 NOTE — ED Notes (Signed)
Report given to floor nurse, Lyla Son, RN. Nurse had no further questions after report given. Pt being prepared for transport to the floor.

## 2011-11-02 NOTE — ED Notes (Signed)
Pt placed on cardiac monitoring before being transported to the floor. Pt currently in sinus tachycardia.

## 2011-11-02 NOTE — ED Notes (Signed)
Paged ortho for arm sling  

## 2011-11-02 NOTE — Telephone Encounter (Signed)
Caller: [name]/Spouse; Patient Name: [name]; PCP: Ruthe Mannan [facility]); Best Callback Phone Number: [phone] Wife calling to let Dr. Dayton Martes know that her husband fell this morning and was having alot of pain so he was taken by EMS to hospital. He is at [facility]. She will be going over to hospital around lunch time between 12-1pm. Her cell number is [phone].

## 2011-11-03 ENCOUNTER — Observation Stay (HOSPITAL_COMMUNITY): Payer: Medicare Other

## 2011-11-03 DIAGNOSIS — E872 Acidosis: Secondary | ICD-10-CM | POA: Diagnosis not present

## 2011-11-03 DIAGNOSIS — J95821 Acute postprocedural respiratory failure: Secondary | ICD-10-CM | POA: Diagnosis not present

## 2011-11-03 DIAGNOSIS — J301 Allergic rhinitis due to pollen: Secondary | ICD-10-CM

## 2011-11-03 DIAGNOSIS — E871 Hypo-osmolality and hyponatremia: Secondary | ICD-10-CM | POA: Diagnosis not present

## 2011-11-03 DIAGNOSIS — S42213A Unspecified displaced fracture of surgical neck of unspecified humerus, initial encounter for closed fracture: Secondary | ICD-10-CM | POA: Diagnosis not present

## 2011-11-03 DIAGNOSIS — IMO0002 Reserved for concepts with insufficient information to code with codable children: Secondary | ICD-10-CM | POA: Diagnosis not present

## 2011-11-03 LAB — SODIUM, URINE, RANDOM: Sodium, Ur: <10 mEq/L

## 2011-11-03 LAB — CBC
Hemoglobin: 11.7 g/dL — ABNORMAL LOW (ref 13.0–17.0)
Platelets: 148 10*3/uL — ABNORMAL LOW (ref 150–400)
RBC: 3.51 MIL/uL — ABNORMAL LOW (ref 4.22–5.81)
WBC: 14.2 10*3/uL — ABNORMAL HIGH (ref 4.0–10.5)

## 2011-11-03 LAB — BASIC METABOLIC PANEL
CO2: 26 mEq/L (ref 19–32)
Calcium: 6.9 mg/dL — ABNORMAL LOW (ref 8.4–10.5)
GFR calc non Af Amer: >90 mL/min (ref >90)
Potassium: 4.2 mEq/L (ref 3.5–5.1)
Sodium: 128 mEq/L — ABNORMAL LOW (ref 135–145)

## 2011-11-03 LAB — VITAMIN B12: Vitamin B-12: >2000 pg/mL — ABNORMAL HIGH (ref 211–911)

## 2011-11-03 LAB — URINALYSIS, ROUTINE W REFLEX MICROSCOPIC
Ketones, ur: 15 mg/dL — AB
Nitrite: POSITIVE — AB
pH: 6 (ref 5.0–8.0)

## 2011-11-03 LAB — OSMOLALITY: Osmolality: 264 mOsm/kg — ABNORMAL LOW (ref 275–300)

## 2011-11-03 MED ORDER — METOPROLOL TARTRATE 1 MG/ML IV SOLN
5.0000 mg | INTRAVENOUS | Status: DC | PRN
  Filled 2011-11-03: qty 5

## 2011-11-03 MED ORDER — METOPROLOL TARTRATE 25 MG PO TABS
25.0000 mg | ORAL_TABLET | Freq: Two times a day (BID) | ORAL | Status: DC
  Administered 2011-11-03 – 2011-11-04 (×3): 25 mg via ORAL
  Filled 2011-11-03 (×4): qty 1

## 2011-11-03 MED ORDER — SPIRITUS FRUMENTI
1.0000 | Freq: Three times a day (TID) | ORAL | Status: DC
  Administered 2011-11-03 – 2011-11-04 (×5): 1 via ORAL
  Filled 2011-11-03 (×7): qty 1

## 2011-11-03 MED ORDER — CHLORDIAZEPOXIDE HCL 5 MG PO CAPS
10.0000 mg | ORAL_CAPSULE | Freq: Three times a day (TID) | ORAL | Status: DC
  Administered 2011-11-03 – 2011-11-04 (×4): 10 mg via ORAL
  Filled 2011-11-03 (×4): qty 2

## 2011-11-03 MED ORDER — BOOST / RESOURCE BREEZE PO LIQD: 1.0000 | Freq: Two times a day (BID) | ORAL | Status: DC

## 2011-11-03 MED ORDER — SODIUM CHLORIDE 0.9 % IV SOLN: INTRAVENOUS | Status: DC

## 2011-11-03 MED ORDER — CLONIDINE HCL 0.1 MG PO TABS
0.1000 mg | ORAL_TABLET | Freq: Four times a day (QID) | ORAL | Status: DC | PRN
  Filled 2011-11-03: qty 1

## 2011-11-03 NOTE — Progress Notes (Signed)
Utilization review completed. Ubah Radke, RN, BSN. 

## 2011-11-03 NOTE — Progress Notes (Signed)
Disoriented today, worse than yesterday; c/o  Pain and muscle spasms P 110s, AF, BP stable LUEx  Deformity  Ice pack, R/M/U sens and motor intact; spasm  Palp pulse only CT reviewed--<10% contact with anterior lateral translation of the shaft  Severely displaced proximal humerus fracture at the surgical neck with high rate of nonunion, particularly with patient's ETOH  Plan: d/w patient, patient's wife, and Dr. Thedore Mins the indications for surgery and concerns re complications which is very high, but in this case, higher with nonoperative attempt at management as opposed to operative, with nonunion, pain, loss of reduction and alignment, and then eventual surgical risks assoc with delayed repair of nonunion that has lower success rate.  Fall and noncompliance risks are not eliminated with either option. We are most concerned about DTs at this time and have recommended ETOH immediately, surgery tomorrow or Fri.  Myrene Galas, MD [facility] [phone] (p)

## 2011-11-03 NOTE — Progress Notes (Signed)
INITIAL ADULT NUTRITION ASSESSMENT Date: 11/03/2011   Time: 2:05 PM Reason for Assessment: MST  ASSESSMENT: Male 66 y.o.  Dx: humerus fx  Hx:  Past Medical History  Diagnosis Date  . HLD (hyperlipidemia) 5/99  . HTN (hypertension) 5/99  . BPH (benign prostatic hypertrophy) 5/99  . GERD (gastroesophageal reflux disease) 07/31/04    gastritis   . Anxiety   . Allergy     pollen/ragweed  . Adenomatous polyp of colon 2006  . Hearing difficulty   . Depression   . Humerus fracture 11/02/2011    LUE  . Peripheral vascular disease   . Pneumonia 1996    hosp  . Chronic bronchitis     "q year or q other year" (11/02/2011)  . History of blood transfusion 2012  . Anemia 2012  . Hernia, umbilical     "unrepaired" (11/02/2011)  . H/O hiatal hernia   . Memory loss     "recently has been forgetting alot; maybe related to the alcohol" (11/02/2011)  . Falls frequently     "daily lately; legs just give out" (11/02/2011)   Past Surgical History  Procedure Date  . L duputryen contractive surgery   . Neck mri 6/04    C5-6 disc abnormality  . Colonoscopy 07/31/04    multiple polyps; repeat in 3 years  . Hosp cp r/o'd 2/24 03/08/07  . Ett myoview 03/09/07    nml EF 66%  . Upper gastrointestinal endoscopy   . Cataract extraction w/ intraocular lens  implant, bilateral 10/2002  . Tonsillectomy     "I was young" (11/02/2011)  . Hemorrhoid surgery     "cauterized a long time ago" (11/02/2011)  . Esophageal varice ligation 2012    Related Meds:  Scheduled Meds:   . chlordiazePOXIDE  10 mg Oral TID  . folic acid  1 mg Oral Daily  . influenza  inactive virus vaccine  0.5 mL Intramuscular Tomorrow-1000  . metoprolol tartrate  25 mg Oral BID  . multivitamin with minerals  1 tablet Oral Daily  . ondansetron      . ondansetron (ZOFRAN) IV  4 mg Intravenous Once  . pantoprazole  40 mg Oral BID AC  . pneumococcal 23 valent vaccine  0.5 mL Intramuscular Tomorrow-1000  . sodium chloride   3 mL Intravenous Q12H  . spiritus frumenti  1 each Oral TID  . terazosin  2 mg Oral QHS  . thiamine  100 mg Oral Daily   Or  . thiamine  100 mg Intravenous Daily  . DISCONTD: sodium chloride  1,000 mL Intravenous Once   Continuous Infusions:   . sodium chloride 20 mL/hr (11/03/11 1115)  . DISCONTD: sodium chloride 100 mL/hr at 11/02/11 1711   PRN Meds:.cloNIDine, cyclobenzaprine, fluticasone, hydrALAZINE, LORazepam, LORazepam, metoprolol, morphine injection, ondansetron (ZOFRAN) IV, oxyCODONE, promethazine   Ht: 5\' 9"  (175.3 cm)  Wt: 180 lb (81.647 kg)  Ideal Wt: 72.7 kg  % Ideal Wt: 112%  Usual Wt: pt states ranges from 180-190 lbs, accurate per chart review % Usual Wt: 100%  Body mass index is 26.58 kg/(m^2).  Food/Nutrition Related Hx: pt is poor historian at this time, difficult to assess  Labs:  CMP     Component Value Date/Time   NA 128* 11/03/2011 0504   K 4.2 11/03/2011 0504   CL 92* 11/03/2011 0504   CO2 26 11/03/2011 0504   GLUCOSE 104* 11/03/2011 0504   BUN 11 11/03/2011 0504   CREATININE 0.79 11/03/2011 0504  CALCIUM 6.9* 11/03/2011 0504   PROT 7.4 01/29/2011 1308   ALBUMIN 3.5 01/29/2011 1308   AST 78* 01/29/2011 1308   ALT 35 01/29/2011 1308   ALKPHOS 291* 01/29/2011 1308   BILITOT 0.7 01/29/2011 1308   GFRNONAA >90 11/03/2011 0504   GFRAA >90 11/03/2011 0504    CBC    Component Value Date/Time   WBC 14.2* 11/03/2011 0504   RBC 3.51* 11/03/2011 0504   HGB 11.7* 11/03/2011 0504   HCT 33.0* 11/03/2011 0504   PLT 148* 11/03/2011 0504   MCV 94.0 11/03/2011 0504   MCH 33.3 11/03/2011 0504   MCHC 35.5 11/03/2011 0504   RDW 14.7 11/03/2011 0504   LYMPHSABS 0.7 11/02/2011 1116   MONOABS 1.4* 11/02/2011 1116   EOSABS 0.0 11/02/2011 1116   BASOSABS 0.1 11/02/2011 1116   Intake: 25% Output:   Intake/Output Summary (Last 24 hours) at 11/03/11 1424 Last data filed at 11/03/11 1027  Gross per 24 hour  Intake   1060 ml  Output    380 ml  Net     680 ml   Last BM (10/21)  Diet Order: General  Supplements/Tube Feeding: none at this time  IVF:    sodium chloride Last Rate: 20 mL/hr (11/03/11 1115)  DISCONTD: sodium chloride Last Rate: 100 mL/hr at 11/02/11 1711    Estimated Nutritional Needs:   Kcal: 2040-2290 Protein: 82-97g Fluid: >2.0 L/day  Pt admitted with displaced humerus fx s/p fall.  Pt denies nutrition concerns or needs. Pt states his wt is variable between 180-190 lbs which his PCP (Dr. Dayton Martes) told him was his usual.  Pt unable to participate in dietary recall due to poor memory/possible confusion at time of visit.  Pt states "I ate soup on Sunday" and is not able to answer additional questions.  RD unable to assess usual intake.  RD notes pt with EtOH abuse.  Pt states sometimes he will drink 32 oz of Pedialyte, wait, and then drink 32 oz Pedialyte again.  RD unable to determine whether this is typical for pt, or if pt is describing his intake for one day. Pt receiving alcohol from Pharmacy to manage DTs.  Plans for OR stabilize fx. Pt reports decreased appetite and states "maybe I'll be hungry for something later."  RD suspects pt with poor nutrition status related to social/environmental fx, however pt without weight loss and no s/s recent weight loss or wasting despite potential poor intake.  RD to follow.  NUTRITION DIAGNOSIS: -Inadequate oral intake (NI-2.1).  Status: Ongoing  RELATED TO: confusion, decreased appetite  AS EVIDENCE BY: pt with decreased intake at meals  MONITORING/EVALUATION(Goals): 1.  Food/Beverage; pt consuming >50% of meals with tolerance.  Pt to consume at least 1 supplement daily. 2.  Wt/wt change; monitor trends  EDUCATION NEEDS: -No education needs identified at this time  INTERVENTION: 1.  Supplements; Resource Breeze BID 2.  General healthful diet; encourage intake of meals and supplements.   DOCUMENTATION CODES Per approved criteria  -Not Applicable    Loyce Dys,  MS RD LDN Clinical Inpatient Dietitian Pager: [phone] Weekend/After hours pager: [phone]

## 2011-11-03 NOTE — Progress Notes (Signed)
11/03/11 1400  Clinical Encounter Type  Visited With Patient  Visit Type Initial  Referral From Nurse   I met patient briefly because he needed to speak to his nurse. I will follow up with him again.  Chaplain Vonzella Nipple

## 2011-11-03 NOTE — Telephone Encounter (Signed)
I'm sorry to hear about this but I do not have admitting privileges.  While pt in the hospital, he will need to be cared for by the hospital ist group.

## 2011-11-03 NOTE — Consult Note (Signed)
I have seen and examined the patient. I agree with the findings above.  Budd Palmer, MD 11/03/2011 8:41 AM

## 2011-11-03 NOTE — Telephone Encounter (Signed)
Patient's wife notified as instructed by telephone. Pt said still not sure when pt will have surgery for fx arm.

## 2011-11-03 NOTE — Telephone Encounter (Signed)
pts wife, [name] said pt was admitted to [facility] due to fx of arm. [name] wants Dr Dayton Martes to order testing while pt is in hospital to see why legs are weak and pt is falling so much. I explained to [name] that she needs to speak with doctor that is taking care of pt while in hospital to get additional testing done. [name] said she is waiting to see the doctor now. Pt's wife still wants call back to see if Dr Dayton Martes can do anything while pt is in hospital, states pt will not come to office for appt.Please advise.

## 2011-11-03 NOTE — Evaluation (Signed)
Physical Therapy Evaluation Patient Details Name: [name] MRN: [id number] DOB: 01/02/1946 Today's Date: 11/03/2011 Time: 4098-1191 PT Time Calculation (min): 29 min  PT Assessment / Plan / Recommendation Clinical Impression  Pt is 66 y/o male admitted for s/p left humerus proximal displaced fx from fall at home.  Pt reports multiple falls at home with hx of ETOH.  MD highly encourage early mobility due to ETOH and ordered orthostatic BP.  Very limited evaluation due to pain but able to get orthostatics listed below.  Pt will benefit from acute PT services to improve overall mobility and prepare for safe d/c.  Will continue to follow to determine best d/c plans after surgery (surgery planned for 11/04/11 or 11/05/11).     PT Assessment  Patient needs continued PT services    Follow Up Recommendations  Other (comment);Home health PT;Supervision/Assistance - 24 hour (will cont determine d/c plan s/p sx; question home support)Need to continue to assess after surgery for best d/c plans    Does the patient have the potential to tolerate intense rehabilitation      Barriers to Discharge  (need to determine caregiver assistance level at home)      Equipment Recommendations  Cane    Recommendations for Other Services     Frequency Min 5X/week    Precautions / Restrictions Precautions Precautions: Fall Required Braces or Orthoses: Other Brace/Splint (Left UE sling) Restrictions Weight Bearing Restrictions: Yes LUE Weight Bearing: Non weight bearing   Pertinent Vitals/Pain Orthostatic BPs  Supine 124/84  Sitting 106/75     Standing 106/60     No c/o dizziness       Mobility  Bed Mobility Bed Mobility: Supine to Sit;Sit to Supine;Scooting to HOB Supine to Sit: 1: +2 Total assist;HOB elevated (HOB 45 degrees) Supine to Sit: Patient Percentage: 60% Sit to Supine: 1: +2 Total assist;HOB flat (HOB 45 degrees) Sit to Supine: Patient Percentage: 50% Scooting to HOB: 1: +2  Total assist Scooting to [facility]: Patient Percentage: 30% Details for Bed Mobility Assistance: +2 (A) to elevate shoulders OOB and slowly descend to bed.  Pt limited due to overall pain and mm spasms. Transfers Transfers: Sit to Stand;Stand to Sit Sit to Stand: 1: +2 Total assist;From elevated surface;From bed Sit to Stand: Patient Percentage: 50% Stand to Sit: 1: +2 Total assist;To bed Stand to Sit: Patient Percentage: 50% Details for Transfer Assistance: +2 (A) for safety with (A) to initiate transfer and slowly descend to bed Ambulation/Gait Ambulation/Gait Assistance: Not tested (comment)    Shoulder Instructions     Exercises     PT Diagnosis: Difficulty walking;Abnormality of gait;Generalized weakness;Acute pain  PT Problem List: Decreased strength;Decreased range of motion;Decreased activity tolerance;Decreased balance;Decreased mobility;Decreased coordination;Decreased cognition;Decreased knowledge of use of DME;Pain PT Treatment Interventions: DME instruction;Gait training;Stair training;Functional mobility training;Therapeutic activities;Therapeutic exercise;Balance training;Patient/family education   PT Goals Acute Rehab PT Goals PT Goal Formulation: With patient Time For Goal Achievement: 11/17/11 Potential to Achieve Goals: Good Pt will go Supine/Side to Sit: with min assist PT Goal: Supine/Side to Sit - Progress: Goal set today Pt will go Sit to Supine/Side: with min assist PT Goal: Sit to Supine/Side - Progress: Goal set today Pt will go Sit to Stand: with min assist PT Goal: Sit to Stand - Progress: Goal set today Pt will go Stand to Sit: with min assist PT Goal: Stand to Sit - Progress: Goal set today Pt will Stand: with supervision;1 - 2 min PT Goal: Stand - Progress: Goal set  today Pt will Ambulate: with min assist;16 - 50 feet  Visit Information  Last PT Received On: 11/03/11 Assistance Needed: +2    Subjective Data  Subjective: "I hurt my ribs."  "I've  fallen about 3 times this last month." Patient Stated Goal: Pt did not set   Prior Functioning  Home Living Lives With: Alone Available Help at Discharge: Family (younger son) Type of Home: House Home Access: Stairs to enter Secretary/administrator of Steps: 6 Entrance Stairs-Rails: Left Home Layout: Two level Alternate Level Stairs-Number of Steps: 13 Alternate Level Stairs-Rails: Left Bathroom Shower/Tub: Tub/shower unit;Curtain Bathroom Toilet: Standard Bathroom Accessibility: Yes How Accessible: Accessible via walker Home Adaptive Equipment: Walker - rolling Prior Function Level of Independence: Independent Able to Take Stairs?: Yes Driving: Yes Vocation: Retired Dominant Hand: Left    Cognition  Overall Cognitive Status: Impaired Area of Impairment: Problem solving;Executive functioning Arousal/Alertness: Awake/alert Orientation Level: Appears intact for tasks assessed Behavior During Session:  (noticeable resting tremors noted) Problem Solving: Pt slow to process and needs extra time with task    Extremity/Trunk Assessment Right Upper Extremity Assessment RUE ROM/Strength/Tone: Within functional levels Left Upper Extremity Assessment LUE ROM/Strength/Tone: Unable to fully assess;Due to pain;Due to precautions Right Lower Extremity Assessment RLE ROM/Strength/Tone: Within functional levels Left Lower Extremity Assessment LLE ROM/Strength/Tone: Within functional levels   Balance Balance Balance Assessed: Yes Static Sitting Balance Static Sitting - Balance Support: Feet supported Static Sitting - Level of Assistance: 3: Mod assist Static Sitting - Comment/# of Minutes: Initial mod (A) to supervision for ~5 minutes.  (A) to maintain upright posture and prevent posterior lean Static Standing Balance Static Standing - Balance Support: Right upper extremity supported Static Standing - Level of Assistance: 3: Mod assist Static Standing - Comment/# of Minutes: (A) to  maintain balance and prevent posterior lean.  Pt tends to use bilateral LE to maintain balance with assistance from bed.  End of Session PT - End of Session Equipment Utilized During Treatment: Other (comment) (left UE sling) Activity Tolerance: Patient limited by pain Patient left: in bed;with call bell/phone within reach Nurse Communication: Mobility status;Precautions;Weight bearing status  GP     [name] 11/03/2011, 2:44 PM Jake Shark, PT DPT [phone]

## 2011-11-03 NOTE — Progress Notes (Signed)
[facility]                                                                                Patient Demographics  [name], is a 66 y.o. male  ONG:[id number]  GMW:[id number]  DOB - [birth year]  Admit date - 11/02/2011  Admitting Physician Penny Pia, MD  Outpatient Primary MD for the patient is Ruthe Mannan, MD  LOS - 1   Chief Complaint  Patient presents with  . Shoulder Pain    patient fell this a.m. at 0100 and has obvious deformity to L humerous.        Assessment & Plan    1. Multiple falls ongoing at home for many weeks now causing left humeral fat chair- false alcohol abuse related, we'll check orthostatics, monitor on telemetry, check B12 to rule out peripheral neuropathy, patient seen and examined by orthopedics today, likely going for surgery tomorrow. Extremely high risk for going into DTs, also poorly controlled this factors at home and noncompliance with medications make him high risk for perioperative cardiopulmonary outcome a month this was discussed with patient and wife, both accept the risks and want to proceed with surgery. Will get a baseline EKG.  Cardio-Pulm Risk stratification for surgery and recommendations to minimize the same:-  A.Cardio-Pulmonary Risk -  this patient is a high for adverse Cardio-Pulmonary  Outcome  from surgery, the risks and benefits were discussed and acceptable to agent and wife  Recommendations for optimizing Cardio-Pulmonary  Risk risk factors  1. Keep SBP<140, HR<85, use Lopressor 5mg  IV q4hrs PRN, or B.Blocker drip PRN. 2. Tight I&O goal to keep even. 3. Minimal sedation. 4. Good pulmunary toilet. 5. Scheduled/PRN Nebs ordered keep Pox>90% 6. Hb>8, transfuse as needed- Lasix 10mg  IV after each unit PRBC Transfused.   B.Bleeding Risk - no previous surgical complications, no easy bruising, recheck baseline INR, Platecount is 148  Will request Surgeon to please Order Lovenox/DVT prophylaxis of his/her  choice when OK from Surgeon's standpoint post op.   2. History of alcohol abuse - severe alcohol abuse, patient does not want to quit, wife in the room agrees with it, for now I have added Librium along with CIWA protocol to prevent DTs, as patient does not want to quit despite counseling, he will be given limited amounts of liquor in the hospital to prevent full-blown DTs.    3. Metabolic acidosis questionable upon admission. I don't see a bicarbonate level on admission, however after IV fluids I do not see any metabolic acidosis at this time.   4. Mild leukocytosis likely reactionary from fall above, chest x-ray stable, patient is afebrile, will check a UA with culture.    5. Hyponatremia - likely due to excessive alcohol intake, will check serum osmolality, urine sodium and osmolality, fluid restriction for now, repeat BMP in the morning.    6. History of hypertension. Blood pressure is stable, low dose Lopressor if tolerated by blood pressure, when necessary IV Lopressor and by mouth clonidine also ordered if needed.     Code Status: DO NOT RESUSCITATE  Family Communication: Discussed with patient and his wife bedside along with Dr. Carola Frost orthopedic surgeon in detail  Disposition Plan: Home    Procedures  Scheduled for left humeral surgery on 11/04/2011   Consults Orthopedics    Time Spent in minutes   55   Antibiotics     Anti-infectives    None      Scheduled Meds:   . chlordiazePOXIDE  10 mg Oral TID  . folic acid  1 mg Oral Daily  .  HYDROmorphone (DILAUDID) injection  1 mg Intravenous Once  .  HYDROmorphone (DILAUDID) injection  1 mg Intravenous Once  . influenza  inactive virus vaccine  0.5 mL Intramuscular Tomorrow-1000  . multivitamin with minerals  1 tablet Oral Daily  . ondansetron      . ondansetron  4 mg Intravenous Once  . ondansetron (ZOFRAN) IV  4 mg Intravenous Once  . pantoprazole  40 mg Oral BID AC  . pneumococcal 23 valent vaccine  0.5  mL Intramuscular Tomorrow-1000  . sodium chloride  1,000 mL Intravenous Once  . sodium chloride  3 mL Intravenous Q12H  . spiritus frumenti  1 each Oral TID  . terazosin  2 mg Oral QHS  . thiamine  100 mg Oral Daily   Or  . thiamine  100 mg Intravenous Daily  . DISCONTD: sodium chloride  1,000 mL Intravenous Once   Continuous Infusions:   . sodium chloride    . DISCONTD: sodium chloride 100 mL/hr at 11/02/11 1711   PRN Meds:.cyclobenzaprine, fluticasone, hydrALAZINE, LORazepam, LORazepam, morphine injection, ondansetron (ZOFRAN) IV, oxyCODONE, promethazine   DVT Prophylaxis  SCDs  Lab Results  Component Value Date   PLT 148* 11/03/2011      Susa Raring K M.D on 11/03/2011 at 11:12 AM  Between 7am to 7pm - Pager - [phone]  After 7pm go to [email or web address] - password [email or web address]  And look for the night coverage person covering for me after hours  [facility] Office  [phone]    Subjective:   Ronon Ferger today has, No headache, No chest pain, No abdominal pain - No Nausea, No new weakness tingling or numbness, No Cough - SOB.   Objective:   Filed Vitals:   11/02/11 1800 11/02/11 2027 11/03/11 0230 11/03/11 0542  BP: 156/102 129/86 110/79 102/70  Pulse: 116 122 114 117  Temp:  98.4 F (36.9 C)  98.5 F (36.9 C)  TempSrc:  Oral  Axillary  Resp:  18  18  Height:      Weight:      SpO2:  98%  98%    Wt Readings from Last 3 Encounters:  11/02/11 81.647 kg (180 lb)  07/01/11 82.555 kg (182 lb)  02/22/11 86.41 kg (190 lb 8 oz)     Intake/Output Summary (Last 24 hours) at 11/03/11 1112 Last data filed at 11/03/11 0928  Gross per 24 hour  Intake   1060 ml  Output    380 ml  Net    680 ml    Exam Awake Alert, Oriented X 2, No new F.N deficits, Anxious affect [place].AT,PERRAL Supple Neck,No JVD, No cervical lymphadenopathy appriciated.  Symmetrical Chest wall movement, Good air movement bilaterally, CTAB RRR,No Gallops,Rubs or new Murmurs,  No Parasternal Heave +ve B.Sounds, Abd Soft, Non tender, No organomegaly appriciated, No rebound - guarding or rigidity. No Cyanosis, Clubbing or edema, No new Rash or bruise, left arm in sling   Data Review   Micro Results Recent Results (from the past 240 hour(s))  SURGICAL PCR SCREEN     Status: Normal   Collection Time  11/02/11  5:57 PM      Component Value Range Status Comment   MRSA, PCR NEGATIVE  NEGATIVE Final    Staphylococcus aureus NEGATIVE  NEGATIVE Final     Radiology Reports Dg Chest 1 View  11/02/2011  *RADIOLOGY REPORT*  Clinical Data: Fall, left humerus deformity  CHEST - 1 VIEW  Comparison: Shoulder films 10/22/ 2013, chest film 09/07/2010  Findings: Left humeral head fracture noted.  Normal cardiac silhouette. The ascending aorta is ectatic.  No effusion, infiltrate, or pneumothorax.  IMPRESSION: No acute cardiopulmonary process.  Ectatic aorta.   Original Report Authenticated By: Genevive Bi, M.D.    Dg Forearm Left  11/02/2011  *RADIOLOGY REPORT*  Clinical Data: Fall  LEFT FOREARM - 2 VIEW  Comparison: None.  Findings: Two views of the left forearm submitted.  No acute fracture or subluxation.  Diffuse osteopenia.  IMPRESSION: No acute fracture or subluxation.   Original Report Authenticated By: Natasha Mead, M.D.    Ct Shoulder Left Wo Contrast  11/02/2011  *RADIOLOGY REPORT*  Clinical Data: proximal humerus fx  CT OF THE LEFT SHOULDER WITHOUT CONTRAST  Technique:  Multidetector CT imaging was performed according to the standard protocol. Multiplanar CT image reconstructions were also generated.  Comparison: 11/02/2011  Findings: Left proximal humeral fracture noted with approximately 2 cm of primarily anterior displacement of the distal fracture fragment back to the humeral head, and with comminution along the dominant fracture plane as well as 1.5 cm of overlap.  Nondisplaced fracture extends through the midportion of the greater tuberosity. No lesser tuberosity  fracture observed.  The long head of the biceps in the bicipital groove extends adjacent to various fracture fragments but does not appear to be entrapped in a fracture plane. One of the fragments does potentially slightly impinge on the distal long head of the biceps tendon on image 88 of series 2.  The humeral head does articulate with the glenoid and is mildly internally rotated.  Expected surrounding soft tissue hematoma noted particularly apparent along the anterior portion the subscapularis and tracking in the right axilla and along the short head of the biceps.  There is a tiny linear calcification along the origin of the short head of the biceps but I doubt that the short head is avulsed.  The acromial undersurface is type 2 (curved).  There is likely some intramuscular hematoma/edema in the anterior deltoid.  IMPRESSION:  1.  Three-part proximal humeral fracture involving the surgical neck and greater tuberosity, with 2 cm of displacement and about 1.5 cm of overlap, and with hematoma tracking along the anterior musculature including the deltoid, short head of the biceps, and along the anterior margin of the subscapularis.   Original Report Authenticated By: Dellia Cloud, M.D.    Dg Humerus Left  11/02/2011  *RADIOLOGY REPORT*  Clinical Data: Fall  LEFT HUMERUS - 2+ VIEW  Comparison: None.  Findings: Three views of the left humerus submitted.  There is displaced subcapital fracture of the left humeral neck.  IMPRESSION: Displaced fracture of proximal left humerus.   Original Report Authenticated By: Natasha Mead, M.D.     CBC  Lab 11/03/11 0504 11/02/11 1116 11/02/11 1022  WBC 14.2* 15.4* --  HGB 11.7* 13.1 13.9  HCT 33.0* 36.2* 41.0  PLT 148* 149* --  MCV 94.0 92.8 --  MCH 33.3 33.6 --  MCHC 35.5 36.2* --  RDW 14.7 14.6 --  LYMPHSABS -- 0.7 --  MONOABS -- 1.4* --  EOSABS -- 0.0 --  BASOSABS -- 0.1 --  BANDABS -- -- --    Chemistries   Lab 11/03/11 0504 11/02/11 1022  NA  128* 129*  K 4.2 3.5  CL 92* 91*  CO2 26 --  GLUCOSE 104* 107*  BUN 11 4*  CREATININE 0.79 1.20  CALCIUM 6.9* --  MG -- --  AST -- --  ALT -- --  ALKPHOS -- --  BILITOT -- --   ------------------------------------------------------------------------------------------------------------------ estimated creatinine clearance is 92.1 ml/min (by C-G formula based on Cr of 0.79). ------------------------------------------------------------------------------------------------------------------ No results found for this basename: HGBA1C:2 in the last 72 hours ------------------------------------------------------------------------------------------------------------------ No results found for this basename: CHOL:2,HDL:2,LDLCALC:2,TRIG:2,CHOLHDL:2,LDLDIRECT:2 in the last 72 hours ------------------------------------------------------------------------------------------------------------------ No results found for this basename: TSH,T4TOTAL,FREET3,T3FREE,THYROIDAB in the last 72 hours ------------------------------------------------------------------------------------------------------------------ No results found for this basename: VITAMINB12:2,FOLATE:2,FERRITIN:2,TIBC:2,IRON:2,RETICCTPCT:2 in the last 72 hours  Coagulation profile No results found for this basename: INR:5,PROTIME:5 in the last 168 hours  No results found for this basename: DDIMER:2 in the last 72 hours  Cardiac Enzymes No results found for this basename: CK:3,CKMB:3,TROPONINI:3,MYOGLOBIN:3 in the last 168 hours ------------------------------------------------------------------------------------------------------------------ No components found with this basename: POCBNP:3

## 2011-11-04 ENCOUNTER — Inpatient Hospital Stay (HOSPITAL_COMMUNITY): Payer: Medicare Other | Admitting: Anesthesiology

## 2011-11-04 ENCOUNTER — Encounter (HOSPITAL_COMMUNITY): Payer: Self-pay | Admitting: Anesthesiology

## 2011-11-04 ENCOUNTER — Encounter (HOSPITAL_COMMUNITY)
Admission: EM | Disposition: A | Discharge status: Skilled Nursing Facility | Payer: Self-pay | Source: Home / Self Care | Attending: Internal Medicine

## 2011-11-04 DIAGNOSIS — S42213A Unspecified displaced fracture of surgical neck of unspecified humerus, initial encounter for closed fracture: Secondary | ICD-10-CM | POA: Diagnosis not present

## 2011-11-04 DIAGNOSIS — I10 Essential (primary) hypertension: Secondary | ICD-10-CM | POA: Diagnosis not present

## 2011-11-04 DIAGNOSIS — F1010 Alcohol abuse, uncomplicated: Secondary | ICD-10-CM

## 2011-11-04 DIAGNOSIS — S42309A Unspecified fracture of shaft of humerus, unspecified arm, initial encounter for closed fracture: Secondary | ICD-10-CM

## 2011-11-04 DIAGNOSIS — K2970 Gastritis, unspecified, without bleeding: Secondary | ICD-10-CM

## 2011-11-04 DIAGNOSIS — IMO0002 Reserved for concepts with insufficient information to code with codable children: Secondary | ICD-10-CM | POA: Diagnosis not present

## 2011-11-04 DIAGNOSIS — D72829 Elevated white blood cell count, unspecified: Secondary | ICD-10-CM

## 2011-11-04 DIAGNOSIS — S42209A Unspecified fracture of upper end of unspecified humerus, initial encounter for closed fracture: Secondary | ICD-10-CM | POA: Diagnosis not present

## 2011-11-04 DIAGNOSIS — K219 Gastro-esophageal reflux disease without esophagitis: Secondary | ICD-10-CM | POA: Diagnosis not present

## 2011-11-04 DIAGNOSIS — J95821 Acute postprocedural respiratory failure: Secondary | ICD-10-CM

## 2011-11-04 DIAGNOSIS — F1020 Alcohol dependence, uncomplicated: Secondary | ICD-10-CM

## 2011-11-04 HISTORY — PX: ORIF HUMERUS FRACTURE: SHX2126

## 2011-11-04 LAB — CBC
MCH: 33.1 pg (ref 26.0–34.0)
MCV: 95.7 fL (ref 78.0–100.0)
Platelets: 125 10*3/uL — ABNORMAL LOW (ref 150–400)
RDW: 14.2 % (ref 11.5–15.5)

## 2011-11-04 LAB — BASIC METABOLIC PANEL
BUN: 14 mg/dL (ref 6–23)
Calcium: 6.9 mg/dL — ABNORMAL LOW (ref 8.4–10.5)
GFR calc non Af Amer: 89 mL/min — ABNORMAL LOW (ref >90)
Glucose, Bld: 76 mg/dL (ref 70–99)
Sodium: 128 mEq/L — ABNORMAL LOW (ref 135–145)

## 2011-11-04 SURGERY — OPEN REDUCTION INTERNAL FIXATION (ORIF) PROXIMAL HUMERUS FRACTURE
Anesthesia: General | Site: Shoulder | Laterality: Left | Wound class: Clean

## 2011-11-04 MED ORDER — DEXTROSE 5 % IV SOLN
1.0000 g | INTRAVENOUS | Status: AC
  Administered 2011-11-04 – 2011-11-06 (×3): 1 g via INTRAVENOUS
  Filled 2011-11-04 (×3): qty 10

## 2011-11-04 MED ORDER — PANTOPRAZOLE SODIUM 40 MG IV SOLR
40.0000 mg | Freq: Two times a day (BID) | INTRAVENOUS | Status: DC
  Administered 2011-11-05 – 2011-11-06 (×4): 40 mg via INTRAVENOUS
  Filled 2011-11-04 (×7): qty 40

## 2011-11-04 MED ORDER — PROPOFOL 10 MG/ML IV BOLUS
INTRAVENOUS | Status: DC | PRN
  Administered 2011-11-04: 150 mg via INTRAVENOUS
  Administered 2011-11-04: 50 mg via INTRAVENOUS

## 2011-11-04 MED ORDER — FENTANYL CITRATE 0.05 MG/ML IJ SOLN
INTRAMUSCULAR | Status: DC | PRN
  Administered 2011-11-04 (×2): 50 ug via INTRAVENOUS
  Administered 2011-11-04: 100 ug via INTRAVENOUS
  Administered 2011-11-04 (×3): 50 ug via INTRAVENOUS

## 2011-11-04 MED ORDER — SODIUM CHLORIDE 0.9 % IV SOLN
10.0000 mg | INTRAVENOUS | Status: DC | PRN
  Administered 2011-11-04: 50 ug/min via INTRAVENOUS

## 2011-11-04 MED ORDER — SODIUM CHLORIDE 0.9 % IV SOLN
INTRAVENOUS | Status: AC
  Administered 2011-11-04 – 2011-11-05 (×3): via INTRAVENOUS

## 2011-11-04 MED ORDER — 0.9 % SODIUM CHLORIDE (POUR BTL) OPTIME
TOPICAL | Status: DC | PRN
  Administered 2011-11-04: 1000 mL

## 2011-11-04 MED ORDER — WHITE PETROLATUM GEL
Status: AC
  Filled 2011-11-04: qty 5

## 2011-11-04 MED ORDER — ALBUMIN HUMAN 5 % IV SOLN
INTRAVENOUS | Status: DC | PRN
  Administered 2011-11-04 (×2): via INTRAVENOUS

## 2011-11-04 MED ORDER — CHLORDIAZEPOXIDE HCL 5 MG PO CAPS: 25.0000 mg | ORAL_CAPSULE | Freq: Three times a day (TID) | ORAL | Status: DC

## 2011-11-04 MED ORDER — CEFAZOLIN SODIUM-DEXTROSE 2-3 GM-% IV SOLR
INTRAVENOUS | Status: DC | PRN
  Administered 2011-11-04: 2 g via INTRAVENOUS

## 2011-11-04 MED ORDER — MIDAZOLAM HCL 5 MG/5ML IJ SOLN
INTRAMUSCULAR | Status: DC | PRN
  Administered 2011-11-04: 1 mg via INTRAVENOUS
  Administered 2011-11-04: 2 mg via INTRAVENOUS
  Administered 2011-11-04: 1 mg via INTRAVENOUS

## 2011-11-04 MED ORDER — ROCURONIUM BROMIDE 100 MG/10ML IV SOLN
INTRAVENOUS | Status: DC | PRN
  Administered 2011-11-04: 30 mg via INTRAVENOUS
  Administered 2011-11-05: 20 mg via INTRAVENOUS

## 2011-11-04 MED ORDER — SUCCINYLCHOLINE CHLORIDE 20 MG/ML IJ SOLN
INTRAMUSCULAR | Status: DC | PRN
  Administered 2011-11-04: 120 mg via INTRAVENOUS
  Administered 2011-11-04: 80 mg via INTRAVENOUS

## 2011-11-04 MED ORDER — EPHEDRINE SULFATE 50 MG/ML IJ SOLN
INTRAMUSCULAR | Status: DC | PRN
  Administered 2011-11-04 (×2): 10 mg via INTRAVENOUS

## 2011-11-04 MED ORDER — LACTATED RINGERS IV SOLN
INTRAVENOUS | Status: DC | PRN
  Administered 2011-11-04 – 2011-11-05 (×4): via INTRAVENOUS

## 2011-11-04 MED ORDER — MAGNESIUM SULFATE 40 MG/ML IJ SOLN
2.0000 g | Freq: Once | INTRAMUSCULAR | Status: AC
  Administered 2011-11-04: 2 g via INTRAVENOUS
  Filled 2011-11-04: qty 50

## 2011-11-04 SURGICAL SUPPLY — 70 items
APL SKNCLS STERI-STRIP NONHPOA (GAUZE/BANDAGES/DRESSINGS) ×2
BANDAGE GAUZE ELAST BULKY 4 IN (GAUZE/BANDAGES/DRESSINGS) ×4 IMPLANT
BENZOIN TINCTURE PRP APPL 2/3 (GAUZE/BANDAGES/DRESSINGS) ×4 IMPLANT
BIT DRILL 2.8X4 QC CORT (BIT) ×1 IMPLANT
BIT DRILL 4 LONG FAST STEP (BIT) ×1 IMPLANT
BIT DRILL 4 SHORT FAST STEP (BIT) ×1 IMPLANT
BRUSH SCRUB DISP (MISCELLANEOUS) ×4 IMPLANT
CLOTH BEACON ORANGE TIMEOUT ST (SAFETY) ×2 IMPLANT
COVER SURGICAL LIGHT HANDLE (MISCELLANEOUS) ×4 IMPLANT
DRAPE C-ARM 42X72 X-RAY (DRAPES) ×2 IMPLANT
DRAPE C-ARMOR (DRAPES) ×2 IMPLANT
DRAPE INCISE IOBAN 66X45 STRL (DRAPES) IMPLANT
DRAPE SURG 17X11 SM STRL (DRAPES) ×4 IMPLANT
DRAPE U-SHAPE 47X51 STRL (DRAPES) ×4 IMPLANT
DRSG ADAPTIC 3X8 NADH LF (GAUZE/BANDAGES/DRESSINGS) ×2 IMPLANT
DRSG MEPILEX BORDER 4X8 (GAUZE/BANDAGES/DRESSINGS) ×1 IMPLANT
DRSG PAD ABDOMINAL 8X10 ST (GAUZE/BANDAGES/DRESSINGS) ×2 IMPLANT
ELECT REM PT RETURN 9FT ADLT (ELECTROSURGICAL) ×2
ELECTRODE REM PT RTRN 9FT ADLT (ELECTROSURGICAL) ×1 IMPLANT
EVACUATOR 1/8 PVC DRAIN (DRAIN) IMPLANT
GLOVE BIO SURGEON STRL SZ7.5 (GLOVE) ×2 IMPLANT
GLOVE BIO SURGEON STRL SZ8 (GLOVE) ×2 IMPLANT
GLOVE BIOGEL PI IND STRL 7.5 (GLOVE) ×1 IMPLANT
GLOVE BIOGEL PI IND STRL 8 (GLOVE) ×1 IMPLANT
GLOVE BIOGEL PI INDICATOR 7.5 (GLOVE) ×1
GLOVE BIOGEL PI INDICATOR 8 (GLOVE) ×1
GOWN PREVENTION PLUS XLARGE (GOWN DISPOSABLE) ×2 IMPLANT
GOWN PREVENTION PLUS XXLARGE (GOWN DISPOSABLE) ×2 IMPLANT
GOWN STRL NON-REIN LRG LVL3 (GOWN DISPOSABLE) ×4 IMPLANT
KIT BASIN OR (CUSTOM PROCEDURE TRAY) ×2 IMPLANT
KIT ROOM TURNOVER OR (KITS) ×2 IMPLANT
LOOP VESSEL MAXI BLUE (MISCELLANEOUS) IMPLANT
MANIFOLD NEPTUNE II (INSTRUMENTS) ×2 IMPLANT
NS IRRIG 1000ML POUR BTL (IV SOLUTION) ×2 IMPLANT
PACK TOTAL JOINT (CUSTOM PROCEDURE TRAY) ×2 IMPLANT
PAD ARMBOARD 7.5X6 YLW CONV (MISCELLANEOUS) ×4 IMPLANT
PEG STND 4.0X42.5MM (Orthopedic Implant) ×6 IMPLANT
PEG STND 4.0X45.0MM (Orthopedic Implant) ×2 IMPLANT
PEG STND 4.0X50.0MM (Orthopedic Implant) ×4 IMPLANT
PEG STND 4.0X60.0MM (Orthopedic Implant) ×1 IMPLANT
PEGSTD 4.0X42.5MM (Orthopedic Implant) ×3 IMPLANT
PEGSTD 4.0X45.0MM (Orthopedic Implant) ×1 IMPLANT
PEGSTD 4.0X50.0MM (Orthopedic Implant) ×2 IMPLANT
PIN GUIDE SHOULDER 2.0MM (PIN) ×2 IMPLANT
PLATE SHOULDER S3 4HOLE LT (Plate) ×2 IMPLANT
SCREW LOCK 90D ANGLED 3.8X22 (Screw) ×4 IMPLANT
SCREW LOCK 90D ANGLED 3.8X32 (Screw) ×4 IMPLANT
SCREW MULTIDIR 3.8X32 HUMRL (Screw) ×1 IMPLANT
SPONGE GAUZE 4X4 12PLY (GAUZE/BANDAGES/DRESSINGS) ×2 IMPLANT
SPONGE LAP 18X18 X RAY DECT (DISPOSABLE) IMPLANT
STAPLER VISISTAT 35W (STAPLE) ×2 IMPLANT
STOCKINETTE IMPERVIOUS LG (DRAPES) ×2 IMPLANT
SUCTION FRAZIER TIP 10 FR DISP (SUCTIONS) ×2 IMPLANT
SUT BONE WAX W31G (SUTURE) ×1 IMPLANT
SUT ETHIBOND 5 LR DA (SUTURE) ×2 IMPLANT
SUT FIBERWIRE #2 38 T-5 BLUE (SUTURE) ×6
SUT PDS AB 2-0 CT1 27 (SUTURE) IMPLANT
SUT VIC AB 0 CT1 27 (SUTURE) ×4
SUT VIC AB 0 CT1 27XBRD ANBCTR (SUTURE) ×2 IMPLANT
SUT VIC AB 2-0 CT1 27 (SUTURE) ×4
SUT VIC AB 2-0 CT1 TAPERPNT 27 (SUTURE) ×2 IMPLANT
SUT VIC AB 2-0 CT3 27 (SUTURE) IMPLANT
SUTURE FIBERWR #2 38 T-5 BLUE (SUTURE) IMPLANT
SYR 5ML LL (SYRINGE) IMPLANT
TAPE CLOTH SURG 6X10 WHT LF (GAUZE/BANDAGES/DRESSINGS) ×1 IMPLANT
TOWEL OR 17X24 6PK STRL BLUE (TOWEL DISPOSABLE) ×2 IMPLANT
TOWEL OR 17X26 10 PK STRL BLUE (TOWEL DISPOSABLE) ×4 IMPLANT
TRAY FOLEY CATH 14FR (SET/KITS/TRAYS/PACK) IMPLANT
WATER STERILE IRR 1000ML POUR (IV SOLUTION) ×2 IMPLANT
YANKAUER SUCT BULB TIP NO VENT (SUCTIONS) IMPLANT

## 2011-11-04 NOTE — Preoperative (Signed)
Beta Blockers   Reason not to administer Beta Blockers:Not Applicable 

## 2011-11-04 NOTE — Progress Notes (Signed)
11/04/11 1300  Clinical Encounter Type  Visited With Patient  Visit Type Follow-up   Visited patient. He was not feeling well due to his surgery yesterday. He thanked me for coming to visit. Chaplain Vonzella Nipple

## 2011-11-04 NOTE — Progress Notes (Signed)
[facility]  Subjective: Pt confused + tremor  Still trying to get on OR schedule  Pt still NPO but will allow him to have his scheduled EtOH until further notice   Objective: Vital signs in last 24 hours: Temp:  [98.1 F (36.7 C)-98.8 F (37.1 C)] 98.5 F (36.9 C) (10/24 0445) Pulse Rate:  [90-112] 100  (10/24 0445) Resp:  [18-20] 18  (10/24 0445) BP: (101-126)/(60-84) 121/63 mmHg (10/24 0445) SpO2:  [96 %-97 %] 97 % (10/24 0445)  Intake/Output from previous day: 10/23 0701 - 10/24 0700 In: 697 [P.O.:480; I.V.:217] Out: 200 [Urine:200] Intake/Output this shift:     Basename 11/04/11 0555 11/03/11 0504 11/02/11 1116 11/02/11 1022  HGB 9.2* 11.7* 13.1 13.9    Basename 11/04/11 0555 11/03/11 0504  WBC 10.8* 14.2*  RBC 2.78* 3.51*  HCT 26.6* 33.0*  PLT 125* 148*    Basename 11/04/11 0555 11/03/11 0504  NA 128* 128*  K 3.7 4.2  CL 92* 92*  CO2 25 26  BUN 14 11  CREATININE 0.87 0.79  GLUCOSE 76 104*  CALCIUM 6.9* 6.9*    Basename 11/03/11 1139  LABPT --  INR 1.33   URINALYSIS   Color, Urine            AMBER    APPearance            CLEAR    Specific Gravity, Urine            1.017    pH            6.0    Glucose, UA            NEGATIVE    Bilirubin Urine            MODERATE    Ketones, ur            15    Protein, ur            NEGATIVE    Urobilinogen, UA            2.0    Nitrite            POSITIVE    Leukocytes, UA            SMALL    Hgb urine dipstick            NEGATIVE    Urine-Other            MUCOUS PRESENT    WBC, UA            3-6    RBC / HPF            0-2    Squamous Epithelial / LPF            RARE    Bacteria, UA            RARE    Casts            HYALINE CASTS     Phyical Exam  Gen: confused, + tremor Lungs: anterior fields clear Cardiac:s1 and s2 Ext: no change in upper extremity exam  Assessment/Plan:  66 y/o male s/p ground level fall due to EtOH abuse  1. Fall 2. Chronic EtOH abuse  Alcohol  tid ordered   3. L proximal humerus fx  Trying to get pt on schedule for today  If unable to do today will get done tomorrow 4. Continue per IM 5. Abnormal Urinalysis  + LE and  nitrites  No bacteria on micro  Will await cx 6. Dispo  Possible OR today   continue NPO but allow for EtOH to limit degree of withdrawal   Mearl Latin, PA-C [facility] [phone] (P) 11/04/2011 8:56 AM

## 2011-11-04 NOTE — Anesthesia Procedure Notes (Signed)
Procedure Name: Intubation Date/Time: 11/04/2011 10:24 PM Performed by: Molli Hazard Pre-anesthesia Checklist: Patient identified, Emergency Drugs available, Suction available and Patient being monitored Patient Re-evaluated:Patient Re-evaluated prior to inductionOxygen Delivery Method: Circle system utilized Preoxygenation: Pre-oxygenation with 100% oxygen Intubation Type: IV induction, Rapid sequence and Cricoid Pressure applied Laryngoscope size: glidescope. Tube type: Subglottic suction tube Tube size: 8.0 mm Number of attempts: 2 Airway Equipment and Method: Stylet Placement Confirmation: ETT inserted through vocal cords under direct vision,  positive ETCO2 and breath sounds checked- equal and bilateral Secured at: 24 cm Dental Injury: Teeth and Oropharynx as per pre-operative assessment  Comments: DL with Miller 2, unable to visualize cords. Mask ventilated to O2 sats of 100%. Glidescope utilized, good view and easy intubation with glidescope.

## 2011-11-04 NOTE — Consult Note (Signed)
Name: [name] MRN: [id number] DOB: February 10, 1945  LOS: 2  CRITICAL CARE CONSULT NOTE  History of Present Illness: This is a 66 y/o male who drinks alcohol heavily was admitted on 10/22 after a fall. He was found to have a displaced fracture of his left humerus and orthopedics recommended surgical repair.  He has been on the [facility] hospitalist service since admission where care has been focused on keeping him out of DT's with Librium and alcohol use and hyponatremia has been treated with saline.  He has also been treated for a UTI with Ceftriaxone.  He underwent ORIF of his proximal humerus on 10/24 in the evening and PCCM was consulted for post op vent management as anesthesia elected to keep him intubated post procedure due to his DT risk.  Lines / Drains: Peripheral IV  Cultures / Sepsis markers: 10/23 urine culture >> 20K CFU/mL enterococcus  Antibiotics: 10/24 Ceftriaxone  >>   Tests / Events: 10/22 CT shoulder >> three part proximal humeral fracture in humeral neck and greater tuberosity with 2cm deplacement     Past Medical History  Diagnosis Date  . HLD (hyperlipidemia) 5/99  . HTN (hypertension) 5/99  . BPH (benign prostatic hypertrophy) 5/99  . GERD (gastroesophageal reflux disease) 07/31/04    gastritis   . Anxiety   . Allergy     pollen/ragweed  . Adenomatous polyp of colon 2006  . Hearing difficulty   . Depression   . Humerus fracture 11/02/2011    LUE  . Peripheral vascular disease   . Pneumonia 1996    hosp  . Chronic bronchitis     "q year or q other year" (11/02/2011)  . History of blood transfusion 2012  . Anemia 2012  . Hernia, umbilical     "unrepaired" (11/02/2011)  . H/O hiatal hernia   . Memory loss     "recently has been forgetting alot; maybe related to the alcohol" (11/02/2011)  . Falls frequently     "daily lately; legs just give out" (11/02/2011)   Past Surgical History  Procedure Date  . L duputryen contractive surgery   . Neck mri 6/04     C5-6 disc abnormality  . Colonoscopy 07/31/04    multiple polyps; repeat in 3 years  . Hosp cp r/o'd 2/24 03/08/07  . Ett myoview 03/09/07    nml EF 66%  . Upper gastrointestinal endoscopy   . Cataract extraction w/ intraocular lens  implant, bilateral 10/2002  . Tonsillectomy     "I was young" (11/02/2011)  . Hemorrhoid surgery     "cauterized a long time ago" (11/02/2011)  . Esophageal varice ligation 2012   Prior to Admission medications   Medication Sig Start Date End Date Taking? Authorizing Provider  b complex vitamins capsule Take 1 capsule by mouth daily.    Yes Historical Provider, MD  cyclobenzaprine (FLEXERIL) 10 MG tablet Take 10 mg by mouth 2 (two) times daily as needed. For spasms   Yes Historical Provider, MD  fexofenadine (ALLEGRA) 180 MG tablet Take 180 mg by mouth daily as needed. For allergies   Yes Historical Provider, MD  fluticasone (FLONASE) 50 MCG/ACT nasal spray Place 2 sprays into the nose as needed. For allergies   Yes Historical Provider, MD  ibuprofen (ADVIL,MOTRIN) 200 MG tablet Take 200 mg by mouth every 6 (six) hours as needed. For pain   Yes Historical Provider, MD  Multiple Vitamin (MULTIVITAMIN WITH MINERALS) TABS Take 1 tablet by mouth daily.  Yes Historical Provider, MD  pantoprazole (PROTONIX) 40 MG tablet Take 40 mg by mouth 2 (two) times daily.   Yes Historical Provider, MD  promethazine (PHENERGAN) 25 MG tablet Take 25 mg by mouth every 6 (six) hours as needed. For nausea   Yes Historical Provider, MD  spironolactone (ALDACTONE) 25 MG tablet Take 1 tablet (25 mg total) by mouth daily. 02/22/11  Yes Dianne Dun, MD  terazosin (HYTRIN) 2 MG capsule Take 1 capsule (2 mg total) by mouth at bedtime. 02/23/11 02/23/12 Yes Dianne Dun, MD  Thiamine HCl (VITAMIN B-1) 100 MG tablet Take 100 mg by mouth daily.    Yes Historical Provider, MD   Allergies  Allergen Reactions  . Nifedipine     REACTION: SWELLING 11/02/2011 pt denies this allergy.   Family  History  Problem Relation Age of Onset  . Heart disease Father   . Diabetes Father   . Stroke Father   . Depression Mother   . Heart disease Brother   . Alcohol abuse Brother   . Liver disease Brother    Social History  reports that he has never smoked. He has never used smokeless tobacco. He reports that he drinks about 540 ounces of alcohol per week. He reports that he does not use illicit drugs.  Review Of Systems   Cannot obtain due to intubation  Vital Signs:   Filed Vitals:   11/04/11 0445 11/04/11 0933 11/04/11 1300 11/04/11 1945  BP: 121/63 111/71 100/66 150/100  Pulse: 100 94 103 96  Temp: 98.5 F (36.9 C)  97.8 F (36.6 C) 98.1 F (36.7 C)  TempSrc:      Resp: 18  18 20   Height:      Weight:      SpO2: 97%  95%     Physical Examination: Gen: chronically ill appearing, sedated on vent HEENT: NCAT, PERRL, EOMi, OP clear,  Neck: supple without masses PULM: Rhonchi bilaterally CV: RRR, no mgr, no JVD AB: BS+, soft, nontender, no hsm Ext: L shoulder swollen, large hematoma posteriorly, dressing from surgery c/d/i Derm: see ext Neuro: sedated on vent   Labs and Imaging:   CBC    Component Value Date/Time   WBC 10.8* 11/04/2011 0555   RBC 2.78* 11/04/2011 0555   HGB 9.2* 11/04/2011 0555   HCT 26.6* 11/04/2011 0555   PLT 125* 11/04/2011 0555   MCV 95.7 11/04/2011 0555   MCH 33.1 11/04/2011 0555   MCHC 34.6 11/04/2011 0555   RDW 14.2 11/04/2011 0555   LYMPHSABS 0.7 11/02/2011 1116   MONOABS 1.4* 11/02/2011 1116   EOSABS 0.0 11/02/2011 1116   BASOSABS 0.1 11/02/2011 1116    BMET    Component Value Date/Time   NA 128* 11/04/2011 0555   K 3.7 11/04/2011 0555   CL 92* 11/04/2011 0555   CO2 25 11/04/2011 0555   GLUCOSE 76 11/04/2011 0555   BUN 14 11/04/2011 0555   CREATININE 0.87 11/04/2011 0555   CALCIUM 6.9* 11/04/2011 0555   GFRNONAA 89* 11/04/2011 0555   GFRAA >90 11/04/2011 0555    ABG    Component Value Date/Time   PHART 7.449  12/26/2009 1920   PCO2ART 25.3* 12/26/2009 1920   PO2ART 73.0* 12/26/2009 1920   HCO3 17.6* 12/26/2009 1920   TCO2 22 11/02/2011 1022   ACIDBASEDEF 5.0* 12/26/2009 1920   O2SAT 96.0 12/26/2009 1920   Impression: Active Problems:  HYPERTENSION  Alcohol abuse  Hyponatremia  Fracture of humerus, proximal, left, closed  Metabolic acidosis  Leukocytosis  Respiratory failure, post-operative    Assessment and Plan:  This is a 66 y/o male with a history of heavy alcohol abuse and a proximal humerus fracture who underwent ORIF on 10/24.  Anaesthesia elected to leave the patient intubated post procedure due to concerns for DT's so PCCM was asked to assist with vent management.  Respiratory failure, post-operative (11/04/2011)   Assessment: Good lung mechanics, should be extubated easily in AM   Plan:  -full vent support overnight -extubate in AM -CXR/ABG x1 tonight  HYPERTENSION (07/28/2006)   Assessment:    Plan:  -prn hydralazine overnight -resume resume metoprolol in AM after extubation  Alcohol abuse (10/20/2010)   Assessment: high risk for DT's   Plan:  -scheduled ativan IV -continue propofol gtt overnight -resume librium and spiritus frumenti after extubation  Hyponatremia (11/17/2010)   Assessment: urine Na low, suggestive of volume depletion but urine inappropriately concentrated so likely some component of SIADH from pain   Plan:  -free water restrict -saline overnight   Fracture of humerus, proximal, left, closed (11/02/2011)   Assessment:    Plan:  -per ortho  Metabolic acidosis (11/02/2011)   Assessment: serum lactate 4.6 on 10/23; uncertain etiology   Plan:  -repeat after return from OR  UTI   Assessment: few WBC's on u/a, enterococcus in urine   Plan: -ceftriaxone through 10/26  Feeding/protein malnutrition: npo overnight Analgesia:  Fentanyl prn Sedation: versed gtt Thromboprophylaxis: scd's HOB >30 degrees Ulcer prophylaxis: PPI (convert to IV  from po) Glucose control/hyperglycemia: monitor glucose with BMET  The patient is critically ill with multiple organ systems failure and requires high complexity decision making for assessment and support, frequent evaluation and titration of therapies, application of advanced monitoring technologies and extensive interpretation of multiple databases. Critical Care Time devoted to patient care services described in this note is 45 minutes.  Heber [place], M.D. Pulmonary and Critical Care Medicine [facility] Pager: [phone]  11/04/2011, 11:09 PM

## 2011-11-04 NOTE — Progress Notes (Signed)
[facility]                                                                                Patient Demographics  [name], is a 66 y.o. male  ZOX:[id number]  [id number]:[id number]  DOB - 1945/01/20  Admit date - 11/02/2011  Admitting Physician Penny Pia, MD  Outpatient Primary MD for the patient is Ruthe Mannan, MD  LOS - 2   Chief Complaint  Patient presents with  . Shoulder Pain    patient fell this a.m. at 0100 and has obvious deformity to L humerous.        Assessment & Plan   76 year old Heavy alcohol abuser admitted to the hospital after multiple falls at home due to alcohol abuse, in the ER was diagnosed with left humeral shaft fracture, orthopedics seeing the patient and planning to operate later on 11/04/2011, patient is high-risk candidate for adverse cardiopulmonary outcome due to medication noncompliance and noncompliance for healthcare in the past, also high risk for going in full-blown DTs, case was discussed bedside with orthopedic physician along with wife, patient and wife understand the risks and want to proceed with the procedure. Patient and wife say that he is going to start drinking alcohol when he goes home therefore limited amounts of liquor being provided in the hospital to prevent full-blown DTs.     1. Multiple falls ongoing at home for many weeks now causing left humeral fracture- fall was alcohol abuse related, negative orthostatics, monitor on telemetry, stable B12 , stable L-spine x-ray, patient seen and examined by orthopedics today, likely going for surgery later 11/04/2011. Extremely high risk for going into DTs, also poorly controlled this factors at home and noncompliance with medications make him high risk for perioperative cardiopulmonary outcome a month this was discussed with patient and wife, both accept the risks and want to proceed with surgery. Stable baseline EKG.  Cardio-Pulm Risk stratification for surgery and  recommendations to minimize the same:-  A.Cardio-Pulmonary Risk -  this patient is a high for adverse Cardio-Pulmonary  Outcome  from surgery, the risks and benefits were discussed and acceptable to agent and wife  Recommendations for optimizing Cardio-Pulmonary  Risk risk factors  1. Keep SBP<140, HR<85, use Lopressor 5mg  IV q4hrs PRN, or B.Blocker drip PRN. 2. Tight I&O goal to keep even. 3. Minimal sedation. 4. Good pulmunary toilet. 5. Scheduled/PRN Nebs ordered keep Pox>90% 6. Hb>8, transfuse as needed- Lasix 10mg  IV after each unit PRBC Transfused.   B.Bleeding Risk - no previous surgical complications, no easy bruising, recheck baseline INR, Platecount is 148  Will request Surgeon to please Order Lovenox/DVT prophylaxis of his/her choice when OK from Surgeon's standpoint post op.   2. History of alcohol abuse - severe alcohol abuse, patient does not want to quit, wife in the room agrees with it, for now I have increased Librium does along with continuing CIWA protocol to prevent DTs, as patient does not want to quit despite counseling, he will be given limited amounts of liquor in the hospital to prevent full-blown DTs.    3. Metabolic acidosis questionable upon admission. I don't see a bicarbonate level on admission, however after IV fluids  I do not see any metabolic acidosis at this time.    4. Mild leukocytosis likely reactionary from fall above, chest x-ray stable, patient is afebrile, borderline UA will place on Rocephin for 3 days monitor cultures.    5. Hyponatremia - urine sodium is less than 10 suggesting dehydration, IV fluids will be given repeat BMP in the morning.    6. History of hypertension. Blood pressure is stable, low dose Lopressor if tolerated by blood pressure, when necessary IV Lopressor and by mouth clonidine also ordered if needed.   7. Multiple falls at home with multiple old bruises, L-spine x-ray stable, physical exam does not demonstrate focal  nodule deficits, likely falls at home do to alcohol abuse, 1 surgeries done PT evaluation thereafter monitor. B12 was checked and it was stable, could still have peripheral neuropathy.    Code Status: DO NOT RESUSCITATE  Family Communication: Discussed with patient and his wife bedside along with Dr. Carola Frost orthopedic surgeon in detail  Disposition Plan: Home    Procedures  Scheduled for left humeral surgery on 11/04/2011   Consults Orthopedics    Time Spent in minutes   55   Antibiotics     Anti-infectives     Start     Dose/Rate Route Frequency Ordered Stop   11/04/11 1115   cefTRIAXone (ROCEPHIN) 1 g in dextrose 5 % 50 mL IVPB        1 g 100 mL/hr over 30 Minutes Intravenous Every 24 hours 11/04/11 1103 11/07/11 1114          Scheduled Meds:    . cefTRIAXone (ROCEPHIN)  IV  1 g Intravenous Q24H  . chlordiazePOXIDE  25 mg Oral TID  . feeding supplement  1 Container Oral BID BM  . folic acid  1 mg Oral Daily  . influenza  inactive virus vaccine  0.5 mL Intramuscular Tomorrow-1000  . metoprolol tartrate  25 mg Oral BID  . multivitamin with minerals  1 tablet Oral Daily  . pantoprazole  40 mg Oral BID AC  . pneumococcal 23 valent vaccine  0.5 mL Intramuscular Tomorrow-1000  . sodium chloride  3 mL Intravenous Q12H  . spiritus frumenti  1 each Oral TID  . terazosin  2 mg Oral QHS  . thiamine  100 mg Oral Daily   Or  . thiamine  100 mg Intravenous Daily  . DISCONTD: chlordiazePOXIDE  10 mg Oral TID   Continuous Infusions:    . sodium chloride 100 mL/hr at 11/04/11 0852  . DISCONTD: sodium chloride 100 mL/hr at 11/02/11 1711  . DISCONTD: sodium chloride 20 mL/hr at 11/03/11 1900   PRN Meds:.cloNIDine, cyclobenzaprine, fluticasone, hydrALAZINE, LORazepam, LORazepam, metoprolol, morphine injection, ondansetron (ZOFRAN) IV, oxyCODONE, promethazine   DVT Prophylaxis  SCDs  Lab Results  Component Value Date   PLT 125* 11/04/2011      Susa Raring K M.D  on 11/04/2011 at 11:03 AM  Between 7am to 7pm - Pager - [phone]  After 7pm go to [email or web address] - password [email or web address]  And look for the night coverage person covering for me after hours  [facility] Office  [phone]    Subjective:   Nicolaas Savo today has, No headache, No chest pain, No abdominal pain - No Nausea, No new weakness tingling or numbness, No Cough - SOB.   Objective:   Filed Vitals:   11/03/11 1805 11/03/11 2150 11/04/11 0445 11/04/11 0933  BP: 101/78 113/72 121/63 111/71  Pulse: 93 90 100 94  Temp:  98.8 F (37.1 C) 98.5 F (36.9 C)   TempSrc:      Resp:  18 18   Height:      Weight:      SpO2:  96% 97%     Wt Readings from Last 3 Encounters:  11/02/11 81.647 kg (180 lb)  07/01/11 82.555 kg (182 lb)  02/22/11 86.41 kg (190 lb 8 oz)     Intake/Output Summary (Last 24 hours) at 11/04/11 1103 Last data filed at 11/04/11 0551  Gross per 24 hour  Intake    337 ml  Output      0 ml  Net    337 ml    Exam Awake Alert, Oriented X 2, No new F.N deficits, Anxious affect [place].AT,PERRAL Supple Neck,No JVD, No cervical lymphadenopathy appriciated.  Symmetrical Chest wall movement, Good air movement bilaterally, CTAB RRR,No Gallops,Rubs or new Murmurs, No Parasternal Heave +ve B.Sounds, Abd Soft, Non tender, No organomegaly appriciated, No rebound - guarding or rigidity. No Cyanosis, Clubbing or edema, No new Rash or bruise, left arm in sling   Data Review   Micro Results Recent Results (from the past 240 hour(s))  SURGICAL PCR SCREEN     Status: Normal   Collection Time   11/02/11  5:57 PM      Component Value Range Status Comment   MRSA, PCR NEGATIVE  NEGATIVE Final    Staphylococcus aureus NEGATIVE  NEGATIVE Final     Radiology Reports Dg Chest 1 View  11/02/2011  *RADIOLOGY REPORT*  Clinical Data: Fall, left humerus deformity  CHEST - 1 VIEW  Comparison: Shoulder films 10/22/ 2013, chest film 09/07/2010  Findings: Left humeral  head fracture noted.  Normal cardiac silhouette. The ascending aorta is ectatic.  No effusion, infiltrate, or pneumothorax.  IMPRESSION: No acute cardiopulmonary process.  Ectatic aorta.   Original Report Authenticated By: Genevive Bi, M.D.    Dg Forearm Left  11/02/2011  *RADIOLOGY REPORT*  Clinical Data: Fall  LEFT FOREARM - 2 VIEW  Comparison: None.  Findings: Two views of the left forearm submitted.  No acute fracture or subluxation.  Diffuse osteopenia.  IMPRESSION: No acute fracture or subluxation.   Original Report Authenticated By: Natasha Mead, M.D.    Ct Shoulder Left Wo Contrast  11/02/2011  *RADIOLOGY REPORT*  Clinical Data: proximal humerus fx  CT OF THE LEFT SHOULDER WITHOUT CONTRAST  Technique:  Multidetector CT imaging was performed according to the standard protocol. Multiplanar CT image reconstructions were also generated.  Comparison: 11/02/2011  Findings: Left proximal humeral fracture noted with approximately 2 cm of primarily anterior displacement of the distal fracture fragment back to the humeral head, and with comminution along the dominant fracture plane as well as 1.5 cm of overlap.  Nondisplaced fracture extends through the midportion of the greater tuberosity. No lesser tuberosity fracture observed.  The long head of the biceps in the bicipital groove extends adjacent to various fracture fragments but does not appear to be entrapped in a fracture plane. One of the fragments does potentially slightly impinge on the distal long head of the biceps tendon on image 88 of series 2.  The humeral head does articulate with the glenoid and is mildly internally rotated.  Expected surrounding soft tissue hematoma noted particularly apparent along the anterior portion the subscapularis and tracking in the right axilla and along the short head of the biceps.  There is a tiny linear calcification along the origin of the short head of the biceps but I  doubt that the short head is avulsed.  The  acromial undersurface is type 2 (curved).  There is likely some intramuscular hematoma/edema in the anterior deltoid.  IMPRESSION:  1.  Three-part proximal humeral fracture involving the surgical neck and greater tuberosity, with 2 cm of displacement and about 1.5 cm of overlap, and with hematoma tracking along the anterior musculature including the deltoid, short head of the biceps, and along the anterior margin of the subscapularis.   Original Report Authenticated By: Dellia Cloud, M.D.    Dg Humerus Left  11/02/2011  *RADIOLOGY REPORT*  Clinical Data: Fall  LEFT HUMERUS - 2+ VIEW  Comparison: None.  Findings: Three views of the left humerus submitted.  There is displaced subcapital fracture of the left humeral neck.  IMPRESSION: Displaced fracture of proximal left humerus.   Original Report Authenticated By: Natasha Mead, M.D.     CBC  Lab 11/04/11 0555 11/03/11 0504 11/02/11 1116 11/02/11 1022  WBC 10.8* 14.2* 15.4* --  HGB 9.2* 11.7* 13.1 13.9  HCT 26.6* 33.0* 36.2* 41.0  PLT 125* 148* 149* --  MCV 95.7 94.0 92.8 --  MCH 33.1 33.3 33.6 --  MCHC 34.6 35.5 36.2* --  RDW 14.2 14.7 14.6 --  LYMPHSABS -- -- 0.7 --  MONOABS -- -- 1.4* --  EOSABS -- -- 0.0 --  BASOSABS -- -- 0.1 --  BANDABS -- -- -- --    Chemistries   Lab 11/04/11 0555 11/03/11 0504 11/02/11 1022  NA 128* 128* 129*  K 3.7 4.2 3.5  CL 92* 92* 91*  CO2 25 26 --  GLUCOSE 76 104* 107*  BUN 14 11 4*  CREATININE 0.87 0.79 1.20  CALCIUM 6.9* 6.9* --  MG 1.1* -- --  AST -- -- --  ALT -- -- --  ALKPHOS -- -- --  BILITOT -- -- --   ------------------------------------------------------------------------------------------------------------------ estimated creatinine clearance is 84.7 ml/min (by C-G formula based on Cr of 0.87). ------------------------------------------------------------------------------------------------------------------ No results found for this basename: HGBA1C:2 in the last 72  hours ------------------------------------------------------------------------------------------------------------------ No results found for this basename: CHOL:2,HDL:2,LDLCALC:2,TRIG:2,CHOLHDL:2,LDLDIRECT:2 in the last 72 hours ------------------------------------------------------------------------------------------------------------------ No results found for this basename: TSH,T4TOTAL,FREET3,T3FREE,THYROIDAB in the last 72 hours ------------------------------------------------------------------------------------------------------------------  [facility] 11/03/11 1139  VITAMINB12 >2000*  FOLATE --  FERRITIN --  TIBC --  IRON --  RETICCTPCT --    Coagulation profile  Lab 11/03/11 1139  INR 1.33  PROTIME --    No results found for this basename: DDIMER:2 in the last 72 hours  Cardiac Enzymes No results found for this basename: CK:3,CKMB:3,TROPONINI:3,MYOGLOBIN:3 in the last 168 hours ------------------------------------------------------------------------------------------------------------------ No components found with this basename: POCBNP:3

## 2011-11-04 NOTE — Anesthesia Preprocedure Evaluation (Addendum)
Anesthesia Evaluation  Patient identified by MRN, date of birth, ID band Patient confused    Reviewed: Allergy & Precautions, H&P , NPO status , Patient's Chart, lab work & pertinent test results  Airway Mallampati: II     Comment: Unable to assess Mallampati secondary to confusion. Dental  (+) Teeth Intact   Pulmonary shortness of breath, asthma , pneumonia -,    + wheezing      Cardiovascular hypertension, Pt. on medications  Noncompliant with meds   Neuro/Psych PSYCHIATRIC DISORDERS Anxiety Depression Currently confused/disoriented secondary to alcohol withdrawal   GI/Hepatic hiatal hernia, PUD, GERD-  Poorly Controlled,(+) Cirrhosis -  Esophageal Varices and ascites  substance abuse  alcohol use,   Endo/Other  negative endocrine ROS  Renal/GU negative Renal ROS     Musculoskeletal   Abdominal   Peds  Hematology   Anesthesia Other Findings   Reproductive/Obstetrics                         Anesthesia Physical Anesthesia Plan  ASA: IV  Anesthesia Plan: General   Post-op Pain Management:    Induction: Intravenous, Rapid sequence and Cricoid pressure planned  Airway Management Planned: Oral ETT  Additional Equipment:   Intra-op Plan:   Post-operative Plan: Post-operative intubation/ventilation  Informed Consent: I have reviewed the patients History and Physical, chart, labs and discussed the procedure including the risks, benefits and alternatives for the proposed anesthesia with the patient or authorized representative who has indicated his/her understanding and acceptance.   Dental advisory given  Plan Discussed with: Anesthesiologist, Surgeon and CRNA  Anesthesia Plan Comments:        Anesthesia Quick Evaluation

## 2011-11-05 ENCOUNTER — Inpatient Hospital Stay (HOSPITAL_COMMUNITY): Payer: Medicare Other

## 2011-11-05 DIAGNOSIS — J984 Other disorders of lung: Secondary | ICD-10-CM | POA: Diagnosis not present

## 2011-11-05 DIAGNOSIS — Z09 Encounter for follow-up examination after completed treatment for conditions other than malignant neoplasm: Secondary | ICD-10-CM | POA: Diagnosis not present

## 2011-11-05 DIAGNOSIS — S42209A Unspecified fracture of upper end of unspecified humerus, initial encounter for closed fracture: Secondary | ICD-10-CM | POA: Diagnosis not present

## 2011-11-05 DIAGNOSIS — F10231 Alcohol dependence with withdrawal delirium: Secondary | ICD-10-CM

## 2011-11-05 LAB — GLUCOSE, CAPILLARY
Glucose-Capillary: 75 mg/dL (ref 70–99)
Glucose-Capillary: 83 mg/dL (ref 70–99)

## 2011-11-05 LAB — URINE CULTURE

## 2011-11-05 LAB — MAGNESIUM: Magnesium: 1.6 mg/dL (ref 1.5–2.5)

## 2011-11-05 LAB — URINALYSIS, ROUTINE W REFLEX MICROSCOPIC
Glucose, UA: NEGATIVE mg/dL
Hgb urine dipstick: NEGATIVE
Leukocytes, UA: NEGATIVE
Protein, ur: NEGATIVE mg/dL
Specific Gravity, Urine: 1.008 (ref 1.005–1.030)
Urobilinogen, UA: 2 mg/dL — ABNORMAL HIGH (ref 0.0–1.0)

## 2011-11-05 LAB — BASIC METABOLIC PANEL
CO2: 21 mEq/L (ref 19–32)
Calcium: 7 mg/dL — ABNORMAL LOW (ref 8.4–10.5)
Chloride: 95 mEq/L — ABNORMAL LOW (ref 96–112)
Creatinine, Ser: 0.64 mg/dL (ref 0.50–1.35)
Glucose, Bld: 81 mg/dL (ref 70–99)

## 2011-11-05 LAB — CBC
HCT: 23 % — ABNORMAL LOW (ref 39.0–52.0)
Hemoglobin: 8 g/dL — ABNORMAL LOW (ref 13.0–17.0)
MCH: 33.1 pg (ref 26.0–34.0)
MCHC: 34.9 g/dL (ref 30.0–36.0)
MCV: 95 fL (ref 78.0–100.0)
RBC: 2.42 MIL/uL — ABNORMAL LOW (ref 4.22–5.81)
RDW: 16.3 % — ABNORMAL HIGH (ref 11.5–15.5)
WBC: 8.9 10*3/uL (ref 4.0–10.5)

## 2011-11-05 MED ORDER — ENOXAPARIN SODIUM 40 MG/0.4ML ~~LOC~~ SOLN
40.0000 mg | SUBCUTANEOUS | Status: DC
  Administered 2011-11-05 – 2011-11-16 (×12): 40 mg via SUBCUTANEOUS
  Filled 2011-11-05 (×12): qty 0.4

## 2011-11-05 MED ORDER — POTASSIUM CHLORIDE 10 MEQ/100ML IV SOLN
10.0000 meq | INTRAVENOUS | Status: AC
  Administered 2011-11-05 (×6): 10 meq via INTRAVENOUS
  Filled 2011-11-05: qty 500
  Filled 2011-11-05: qty 100

## 2011-11-05 MED ORDER — SODIUM CHLORIDE 0.9 % IV SOLN: 250.0000 mL | INTRAVENOUS | Status: DC | PRN

## 2011-11-05 MED ORDER — SODIUM CHLORIDE 0.9 % IV SOLN
1.0000 mg/h | INTRAVENOUS | Status: DC
  Filled 2011-11-05: qty 10

## 2011-11-05 MED ORDER — MIDAZOLAM BOLUS VIA INFUSION
1.0000 mg | INTRAVENOUS | Status: DC | PRN
  Filled 2011-11-05: qty 2

## 2011-11-05 MED ORDER — DIAZEPAM 5 MG/ML IJ SOLN
5.0000 mg | Freq: Once | INTRAMUSCULAR | Status: AC
  Administered 2011-11-05: 5 mg via INTRAVENOUS
  Filled 2011-11-05: qty 2

## 2011-11-05 MED ORDER — MAGNESIUM SULFATE IN D5W 10-5 MG/ML-% IV SOLN
1.0000 g | Freq: Once | INTRAVENOUS | Status: AC
  Administered 2011-11-05: 1 g via INTRAVENOUS
  Filled 2011-11-05: qty 100

## 2011-11-05 MED ORDER — FENTANYL BOLUS VIA INFUSION
25.0000 ug | Freq: Four times a day (QID) | INTRAVENOUS | Status: DC | PRN
  Filled 2011-11-05: qty 100

## 2011-11-05 MED ORDER — LIDOCAINE HCL (CARDIAC) 20 MG/ML IV SOLN
INTRAVENOUS | Status: DC | PRN
  Administered 2011-11-04: 80 mg via INTRAVENOUS

## 2011-11-05 MED ORDER — FUROSEMIDE 10 MG/ML IJ SOLN
20.0000 mg | Freq: Once | INTRAMUSCULAR | Status: AC
  Administered 2011-11-05: 20 mg via INTRAVENOUS
  Filled 2011-11-05: qty 2

## 2011-11-05 MED ORDER — LORAZEPAM 2 MG/ML IJ SOLN
2.0000 mg | INTRAMUSCULAR | Status: DC
  Administered 2011-11-05 (×2): 2 mg via INTRAVENOUS
  Filled 2011-11-05 (×2): qty 1

## 2011-11-05 MED ORDER — PROPOFOL 10 MG/ML IV EMUL
5.0000 ug/kg/min | INTRAVENOUS | Status: DC
  Administered 2011-11-05: 20 ug/kg/min via INTRAVENOUS
  Filled 2011-11-05: qty 100

## 2011-11-05 MED ORDER — LORAZEPAM BOLUS VIA INFUSION: 2.0000 mg | INTRAVENOUS | Status: DC

## 2011-11-05 MED ORDER — VITAMIN B-1 100 MG PO TABS
100.0000 mg | ORAL_TABLET | Freq: Every day | ORAL | Status: DC
  Filled 2011-11-05 (×2): qty 1

## 2011-11-05 MED ORDER — ARTIFICIAL TEARS OP OINT
TOPICAL_OINTMENT | OPHTHALMIC | Status: DC | PRN
  Administered 2011-11-04: 1 via OPHTHALMIC

## 2011-11-05 MED ORDER — PROPOFOL INFUSION 10 MG/ML OPTIME
INTRAVENOUS | Status: DC | PRN
  Administered 2011-11-05: 25 ug/min via INTRAVENOUS

## 2011-11-05 MED ORDER — FOLIC ACID 1 MG PO TABS
1.0000 mg | ORAL_TABLET | Freq: Every day | ORAL | Status: DC
  Filled 2011-11-05 (×2): qty 1

## 2011-11-05 MED ORDER — ADULT MULTIVITAMIN W/MINERALS CH
1.0000 | ORAL_TABLET | Freq: Every day | ORAL | Status: DC
  Administered 2011-11-06 – 2011-11-09 (×3): 1 via ORAL
  Filled 2011-11-05 (×6): qty 1

## 2011-11-05 MED ORDER — HEPARIN SODIUM (PORCINE) 5000 UNIT/ML IJ SOLN
5000.0000 [IU] | Freq: Three times a day (TID) | INTRAMUSCULAR | Status: DC
  Administered 2011-11-05: 5000 [IU] via SUBCUTANEOUS
  Filled 2011-11-05 (×4): qty 1

## 2011-11-05 MED ORDER — HYDROMORPHONE HCL PF 1 MG/ML IJ SOLN: 0.2500 mg | INTRAMUSCULAR | Status: DC | PRN

## 2011-11-05 MED ORDER — LORAZEPAM 2 MG/ML IJ SOLN
1.0000 mg | INTRAMUSCULAR | Status: DC | PRN
  Administered 2011-11-05 (×2): 4 mg via INTRAVENOUS
  Administered 2011-11-05 – 2011-11-06 (×3): 2 mg via INTRAVENOUS
  Administered 2011-11-06: 4 mg via INTRAVENOUS
  Filled 2011-11-05 (×2): qty 1
  Filled 2011-11-05 (×2): qty 2
  Filled 2011-11-05: qty 1
  Filled 2011-11-05 (×2): qty 2

## 2011-11-05 MED ORDER — SODIUM CHLORIDE 0.9 % IV SOLN
25.0000 ug/h | INTRAVENOUS | Status: DC
  Filled 2011-11-05: qty 50

## 2011-11-05 MED ORDER — FENTANYL CITRATE 0.05 MG/ML IJ SOLN
25.0000 ug | INTRAMUSCULAR | Status: DC | PRN
  Administered 2011-11-06 – 2011-11-11 (×20): 50 ug via INTRAVENOUS
  Filled 2011-11-05 (×20): qty 2

## 2011-11-05 MED ORDER — CHLORHEXIDINE GLUCONATE 0.12 % MT SOLN
OROMUCOSAL | Status: AC
  Administered 2011-11-05: 15 mL
  Filled 2011-11-05: qty 15

## 2011-11-05 MED ORDER — LORAZEPAM 2 MG/ML IJ SOLN: 2.0000 mg | INTRAMUSCULAR | Status: DC

## 2011-11-05 NOTE — Progress Notes (Signed)
Name: [name] MRN: [id number] DOB: 25-Feb-1945  LOS: 3  CRITICAL CARE CONSULT NOTE  History of Present Illness:  66 yo admitted 10/22 after fall and found to have displaced fracture of his left humerus.  Has hx of ETOH with concern for DT's.  Had ORIF proximal Lt humerus 10/25, and remained on vent post-op.  PCCM consulted to assist with vent management.  Significant PMHx HTN, Hyperlipidemia, GERD, BPH, Anxiety/depression  Lines / Drains: Peripheral IV ETT 10/24>>  Cultures / Sepsis markers: 10/23 urine culture >> 20K CFU/mL enterococcus  Antibiotics: 10/24 Ceftriaxone  >>   Tests / Events: 10/22 CT shoulder >> three part proximal humeral fracture in humeral neck and greater tuberosity with 2cm deplacement  Subjective: Doing well with SBT.    Vital Signs:   Filed Vitals:   11/05/11 0700 11/05/11 0749 11/05/11 0800 11/05/11 0844  BP: 95/63  125/67 125/67  Pulse: 82  87 95  Temp:  98.3 F (36.8 C)    TempSrc:  Oral    Resp: 14  15 21   Height:      Weight:      SpO2: 100%  100% 100%    Physical Examination: Gen: chronically ill appearing, sedated on vent HEENT: NCAT, PERRL, EOMi, OP clear,  Neck: supple without masses PULM: Rhonchi bilaterally CV: RRR, no mgr, no JVD AB: BS+, soft, nontender, no hsm Ext: L shoulder swollen, large hematoma posteriorly, dressing from surgery c/d/i Derm: see ext Neuro: sedated on vent   Labs and Imaging:   CBC    Component Value Date/Time   WBC 8.9 11/05/2011 0241   RBC 2.42* 11/05/2011 0241   HGB 8.0* 11/05/2011 0241   HCT 23.0* 11/05/2011 0241   PLT 126* 11/05/2011 0241   MCV 95.0 11/05/2011 0241   MCH 33.1 11/05/2011 0241   MCHC 34.8 11/05/2011 0241   RDW 14.3 11/05/2011 0241   LYMPHSABS 0.7 11/02/2011 1116   MONOABS 1.4* 11/02/2011 1116   EOSABS 0.0 11/02/2011 1116   BASOSABS 0.1 11/02/2011 1116    BMET    Component Value Date/Time   NA 129* 11/05/2011 0241   K 3.0* 11/05/2011 0241   CL 95* 11/05/2011  0241   CO2 21 11/05/2011 0241   GLUCOSE 81 11/05/2011 0241   BUN 10 11/05/2011 0241   CREATININE 0.64 11/05/2011 0241   CALCIUM 7.0* 11/05/2011 0241   GFRNONAA >90 11/05/2011 0241   GFRAA >90 11/05/2011 0241    ABG    Component Value Date/Time   PHART 7.449 12/26/2009 1920   PCO2ART 25.3* 12/26/2009 1920   PO2ART 73.0* 12/26/2009 1920   HCO3 17.6* 12/26/2009 1920   TCO2 22 11/02/2011 1022   ACIDBASEDEF 5.0* 12/26/2009 1920   O2SAT 96.0 12/26/2009 1920   Dg Lumbar Spine Complete  11/03/2011  *RADIOLOGY REPORT*  Clinical Data: Multiple falls  LUMBAR SPINE - COMPLETE 4+ VIEW  Comparison: None Correlation:  CT abdomen pelvis 06/23/2009  Findings: Osseous demineralization. Vertebral body heights maintained without fracture or subluxation. Minimal scattered disc space narrowing and endplate spur formation. No definite spondylolysis or bone destruction. Minimal scattered facet degenerative changes. Mild right hip joint space narrowing. SI joints symmetric. Scattered vascular calcifications. Numerous air-filled loops of large and small bowel throughout abdomen.  IMPRESSION: Osseous demineralization. Mild scattered degenerative disc and facet disease changes. No acute abnormalities.   Original Report Authenticated By: Lollie Marrow, M.D.    Dg Chest Port 1 View  11/05/2011  *RADIOLOGY REPORT*  Clinical Data: Check endotracheal  tube.  PORTABLE CHEST - 1 VIEW  Comparison: 11/02/2011  Findings: Endotracheal tube is 3.6 cm above the carina.  There are low lung volumes.  Patchy densities in the lower lungs most likely represent atelectasis.  Heart size is likely accentuated by the portable technique and low lung volumes. Internal fixation of the proximal left humerus.  IMPRESSION: Endotracheal tube is appropriately positioned above the carina.  Low lung volumes.   Original Report Authenticated By: Richarda Overlie, M.D.    Dg Shoulder Left  11/05/2011  *RADIOLOGY REPORT*  Clinical Data: Fracture fixation.   LEFT SHOULDER - 2+ VIEW, DG C-ARM 61-120 MIN  Comparison: There is CT left shoulder 11/02/2011.  Findings: Three fluoroscopic spot views are provided.  Images demonstrate placement plate and screws for fixation of a surgical neck fracture of the humerus.  Hardware is intact.  No acute abnormality.  IMPRESSION: ORIF left surgical neck fracture.   Original Report Authenticated By: Bernadene Bell. D'ALESSIO, M.D.    Dg Shoulder Left Port  11/05/2011  *RADIOLOGY REPORT*  Clinical Data: Left humeral fracture.  PORTABLE LEFT SHOULDER - 2+ VIEW  Comparison: 11/02/2011 and 11/04/2011  Findings: Two views of the left shoulder demonstrates plate and screw fixation of the comminuted proximal humeral fracture.  The humeral head appears to be located.  Left AC joint is intact and surgical skin staples are present.  IMPRESSION: Internal fixation of the proximal left humeral fracture.   Original Report Authenticated By: Richarda Overlie, M.D.    Dg C-arm 61-120 Min  11/05/2011  *RADIOLOGY REPORT*  Clinical Data: Fracture fixation.  LEFT SHOULDER - 2+ VIEW, DG C-ARM 61-120 MIN  Comparison: There is CT left shoulder 11/02/2011.  Findings: Three fluoroscopic spot views are provided.  Images demonstrate placement plate and screws for fixation of a surgical neck fracture of the humerus.  Hardware is intact.  No acute abnormality.  IMPRESSION: ORIF left surgical neck fracture.   Original Report Authenticated By: Bernadene Bell. Maricela Curet, M.D.        Assessment and Plan:  This is a 66 y/o male with a history of heavy alcohol abuse and a proximal humerus fracture who underwent ORIF on 10/24.  Anaesthesia elected to leave the patient intubated post procedure due to concerns for DT's so PCCM was asked to assist with vent management.  Respiratory failure, post-operative (11/04/2011)   Assessment: Good lung mechanics.   Plan:  -hopefully extubated soon -adjust oxygen to keep SpO2 > 92% -bronchial hygiene  HYPERTENSION (07/28/2006)    Assessment:    Plan:  -prn hydralazine overnight -resume resume metoprolol after extubation when able to swallow  Alcohol abuse (10/20/2010)   Assessment: high risk for DT's   Plan:  -CIWA q4h -ativan prn for CIWA > 8  Hyponatremia (11/17/2010)   Assessment: urine Na low, suggestive of volume depletion but urine inappropriately concentrated so likely some component of SIADH from pain   Plan:  -free water restrict -saline overnight   Fracture of humerus, proximal, left, closed (11/02/2011)   Assessment:    Plan:  -per ortho  Metabolic acidosis (11/02/2011)   Assessment: serum lactate 4.6 on 10/23; uncertain etiology.  Improved 10/25.   Plan:  -f/u BMET  UTI   Assessment: few WBC's on u/a, enterococcus in urine   Plan: -ceftriaxone through 10/26  Feeding/protein malnutrition: npo Analgesia:  Fentanyl prn Sedation: versed gtt Thromboprophylaxis: scd's HOB >30 degrees Ulcer prophylaxis: PPI (convert to IV from po) Glucose control/hyperglycemia: monitor glucose with BMET   Critical care time  35 minutes.  Coralyn Helling, MD [facility] Pulmonary/Critical Care 11/05/2011, 9:07 AM Pager:  [phone] After 3pm call: [phone]

## 2011-11-05 NOTE — Progress Notes (Addendum)
Patient is intubated postoperatively On the PCCM service for today

## 2011-11-05 NOTE — Anesthesia Postprocedure Evaluation (Signed)
  Anesthesia Post-op Note  Patient: [name]  Procedure(s) Performed: Procedure(s) (LRB) with comments: OPEN REDUCTION INTERNAL FIXATION (ORIF) PROXIMAL HUMERUS FRACTURE (Left)  Patient Location: SICU  Anesthesia Type: General  Level of Consciousness: Patient remains intubated per anesthesia plan  Airway and Oxygen Therapy: Patient remains intubated per anesthesia plan  Post-op Pain: mild  Post-op Assessment: Post-op Vital signs reviewed  Post-op Vital Signs: Reviewed  Complications: No apparent anesthesia complications

## 2011-11-05 NOTE — Op Note (Signed)
NAMERYCE, VARDEMAN NO.:  [id number]  MEDICAL RECORD NO.:  [id number]  LOCATION:  2112                         FACILITY:  [facility]  PHYSICIAN:  Doralee Albino. Carola Frost, M.D. DATE OF BIRTH:  [birth year]  DATE OF PROCEDURE:  11/04/2011 DATE OF DISCHARGE:                              OPERATIVE REPORT   PREOPERATIVE DIAGNOSIS:  Displaced left proximal humerus fracture.  POSTOPERATIVE DIAGNOSIS:  Displaced left proximal humerus fracture with associated tuberosity fractures.  PROCEDURE:  Open reduction and internal fixation of left proximal humerus including the tuberosities.  SURGEON:  Doralee Albino. Carola Frost, M.D.  ASSISTANT:  Mearl Latin, [place]  ANESTHESIA:  General.  COMPLICATIONS:  None.  ESTIMATED BLOOD LOSS:  150 mL.  DISPOSITION:  ICU, intubated.  CONDITION:  Stable.  BRIEF SUMMARY OF INDICATION OF PROCEDURE:  Artavis Guzzardo is a 66 year old male with a history of alcoholism who sustained a fall resulting in displaced left proximal humerus fracture.  I discussed with him and his wife the risks and benefits of surgical repair versus nonsurgical management.  We are well aware of his potential for falling and also his constant tremors and potential for nonunion as a result of further falls.  We also had several frank discussions regarding the possibility of delirium tremens and were quite concerned about this and discussed it with the anesthesiologist on multiple occasions as well.  The consensus opinion was that the risk of complications with this injury were significant with either nonsurgical or surgical management, but that the best potential for minimizing the severity of those complications was with surgical management and to perform that as soon as the patient was cleared and could proceed.  BRIEF SUMMARY OF PROCEDURE:  Mr. Mcguane was taken to the operating room where general anesthesia was induced.  He did receive 2 g of Ancef preoperatively.  His left upper  extremity was prepped and draped in usual sterile fashion with a shoulder bump under the blade and operating supine on a radiolucent table.  Standard deltopectoral approach was made.  Cephalic vein was identified and retracted laterally with the deltoid.  Continued deep to the clavipectoral fascia.  All soft tissue attachments were left intact to the bone, however, there was a large cortical piece off the anterolateral aspect that had no soft tissue attachment and was interdigitated within the fragment, could not be retained effectively.  The tendon was grasped with #2 FiberWire and using a modified Mason-Allen stitch and then the primary fracture fragments aligned and secured provisionally with Biomet proximal humeral locking plate using a standard screw distally and K-wire proximally. Plate position was adjusted, the angle of the humeral neck down into appropriate and then no attempt at a more anatomic reduction was made in order to preserve all possible blood supply, which were quite effective in dealing.  All the pegs were used in the articular segment and then 3 screws in the shaft segment including the locking caps.  Final images showed appropriate hardware placement, trajectory, length, and reduction of tuberosities all of which moved as a unit.  The wound was irrigated and closed in standard layered fashion with 0 Vicryl, 2-0 Vicryl, and staples.  Sterile gently  compressive dressing was applied.  The patient was placed into a sling and then transported to the ICU in stable condition, but under sedation given again the potential alcohol withdrawal effects, which appeared to be progressing, but is not improving.  Critical Care Medicine is aware of the patient and is anticipating his arrival.  Montez Morita, PA-C did assist me throughout the procedure and was absolutely necessary for safe and effective completion of the case as he was required to maintain the reduction, pull  traction, protect the vein and medial neurovascular structures and placement of provisional fixation.  Assistance with wound closure.  PROGNOSIS:  The patient remains at extremely high risk for perioperative complications, and is a high fall risk.     Doralee Albino. Carola Frost, M.D.     [facility]  D:  11/05/2011  T:  11/05/2011  Job:  [id number]

## 2011-11-05 NOTE — Procedures (Signed)
Extubation Procedure Note  Patient Details:   Name: [name] DOB: March 03, 1945 MRN: [id number]   Airway Documentation:     Evaluation  O2 sats: stable throughout Complications: No apparent complications Patient did tolerate procedure well. Bilateral Breath Sounds: Rhonchi Suctioning: Oral;Airway Yes  Kandra Nicolas 11/05/2011, 9:19 AM

## 2011-11-05 NOTE — Progress Notes (Signed)
MD ordered to cut sedation off and the pt was placed on wean at this time. Pt is tolerating well. No complications noted. RT will monitor.

## 2011-11-05 NOTE — Progress Notes (Signed)
Orthopaedic Trauma Service (OTS)  Subjective: 1 Day Post-Op Procedure(s) (LRB): OPEN REDUCTION INTERNAL FIXATION (ORIF) PROXIMAL HUMERUS FRACTURE (Left)    Pt left intubated overnight due to concerns for DT's In ICU Extubated a short while ago Pt states he is not having pain Often speaking in incomprehensible phrases and states that he is currently in [place]  Objective: Current Vitals Blood pressure 125/67, pulse 95, temperature 98.3 F (36.8 C), temperature source Oral, resp. rate 21, height 5\' 9"  (1.753 m), weight 86.8 kg (191 lb 5.8 oz), SpO2 100.00%. Vital signs in last 24 hours: Temp:  [97.8 F (36.6 C)-98.3 F (36.8 C)] 98.3 F (36.8 C) (10/25 0749) Pulse Rate:  [82-103] 95  (10/25 0844) Resp:  [5-21] 21  (10/25 0844) BP: (94-160)/(63-110) 125/67 mmHg (10/25 0844) SpO2:  [95 %-100 %] 100 % (10/25 0844) FiO2 (%):  [30 %-50 %] 30 % (10/25 0904) Weight:  [86.8 kg (191 lb 5.8 oz)-88.1 kg (194 lb 3.6 oz)] 86.8 kg (191 lb 5.8 oz) (10/25 0500)  Intake/Output from previous day: 10/24 0701 - 10/25 0700 In: 4993.1 [I.V.:4283.1; IV Piggyback:710] Out: 1300 [Urine:1150; Blood:150]  LABS  Basename 11/05/11 0241 11/04/11 0555 11/03/11 0504 11/02/11 1116 11/02/11 1022  HGB 8.0* 9.2* 11.7* 13.1 13.9    Basename 11/05/11 0241 11/04/11 0555  WBC 8.9 10.8*  RBC 2.42* 2.78*  HCT 23.0* 26.6*  PLT 126* 125*    Basename 11/05/11 0241 11/04/11 0555  NA 129* 128*  K 3.0* 3.7  CL 95* 92*  CO2 21 25  BUN 10 14  CREATININE 0.64 0.87  GLUCOSE 81 76  CALCIUM 7.0* 6.9*    Basename 11/05/11 0241 11/03/11 1139  LABPT -- --  INR 1.58* 1.33     Physical Exam  Gen: Awake, oriented to person only Lungs:coarse sounds B Cardiac: S1 and s2 Abd:+ BS Ext:     Left upper extremity  Dressing stable  Ext warm  + radial pulse  Extensive ecchymosis noted  R/U/M motor and sensory functions intact  Ax sensation intact  Swelling stable    Assessment/Plan: 1 Day Post-Op  Procedure(s) (LRB): OPEN REDUCTION INTERNAL FIXATION (ORIF) PROXIMAL HUMERUS FRACTURE (Left)  66 y/o male s/p ground level fall due to EtOH abuse   1. Fall  2. Chronic EtOH abuse   Librium and etoh resumed  Still concern for DT's 3. L proximal humerus fx POD 1  Surgery went very well  NWB L upper extremity  Shoulder pendulums  Active hand, forearm, elbow motion as tolerated  Ice prn 4. Continue per IM  5. UTI   On abx  6. ABL anemia  Would expect h/h to trend down a little more over next 2 days  Given medical hx and issues pertaining to cardio-pulmonary risk will transfuse 1 unit of PRBC's today  Follow up h/h in am 7. DVT/PE prophylaxis  Start lovenox 40 mg sq daily  This particular injury will not require discharge pharmacologic tx  7. Dispo   Appreciate CCM  Medical management per primary teams  PT/OT when clinically appropriate per primary teams   Therapies ok from ortho standpoint  Mearl Latin, PA-C [facility] [phone] (P) 11/05/2011, 10:14 AM

## 2011-11-05 NOTE — Brief Op Note (Signed)
11/02/2011 - 11/05/2011  12:59 AM  PATIENT:  [name]  66 y.o. male  PRE-OPERATIVE DIAGNOSIS:  Left Proximal Humerus Fracture  POST-OPERATIVE DIAGNOSIS:  Left Proximal Humerus Fracture  PROCEDURE:  Procedure(s) (LRB) with comments: OPEN REDUCTION INTERNAL FIXATION (ORIF) PROXIMAL HUMERUS FRACTURE (Left)  SURGEON:  Surgeon(s) and Role:    * Budd Palmer, MD - Primary  PHYSICIAN ASSISTANT: Montez Morita, [facility]  ANESTHESIA:   general  EBL:  Total I/O In: 3500 [I.V.:3000; IV Piggyback:500] Out: 475 [Urine:325; Blood:150]  BLOOD ADMINISTERED:none  DRAINS: none   LOCAL MEDICATIONS USED:  NONE  SPECIMEN:  No Specimen  DISPOSITION OF SPECIMEN:  N/A  COUNTS:  YES  TOURNIQUET:  * Missing tourniquet times found for documented tourniquets in log:  40981 *  DICTATION: .Other Dictation: Dictation Number [id number]  PLAN OF CARE: Admit to inpatient   PATIENT DISPOSITION:  ICU - intubated and hemodynamically stable.   Delay start of Pharmacological VTE agent (>24hrs) due to surgical blood loss or risk of bleeding: no

## 2011-11-05 NOTE — Progress Notes (Signed)
Physical Therapy Treatment Patient Details Name: [name] MRN: [id number] DOB: 04-05-1945 Today's Date: 11/05/2011 Time: 4098-1191 PT Time Calculation (min): 16 min  PT Assessment / Plan / Recommendation Comments on Treatment Session  pt presents with L Humeral ORIF and hx Etoh.  pt requires 2 person A for any mobility due to impaired cognition.  pt very limited with mobility and will need SNF at D/C to improve mobility and decrease burden of care.      Follow Up Recommendations  Post acute inpatient     Does the patient have the potential to tolerate intense rehabilitation  No, Recommend SNF  Barriers to Discharge        Equipment Recommendations  Cane    Recommendations for Other Services OT consult  Frequency Min 5X/week   Plan Discharge plan needs to be updated;Frequency remains appropriate    Precautions / Restrictions Precautions Precautions: Fall Required Braces or Orthoses: Other Brace/Splint (Left UE sling) Restrictions Weight Bearing Restrictions: Yes LUE Weight Bearing: Non weight bearing   Pertinent Vitals/Pain Does not rate, but grimaces and calls out during mobility.  RN premedicated.      Mobility  Bed Mobility Bed Mobility: Supine to Sit;Sitting - Scoot to Edge of Bed;Sit to Supine Supine to Sit: 1: +2 Total assist Supine to Sit: Patient Percentage: 20% Sitting - Scoot to Edge of Bed: 1: +2 Total assist Sitting - Scoot to Edge of Bed: Patient Percentage: 0% Sit to Supine: 1: +2 Total assist Sit to Supine: Patient Percentage: 20% Details for Bed Mobility Assistance: pt not following directions and requires 2 person A to complete all mobility.  pt seems to have pain during mobility, but does not rate.   Transfers Transfers: Not assessed Ambulation/Gait Ambulation/Gait Assistance: Not tested (comment) Stairs: No Wheelchair Mobility Wheelchair Mobility: No    Exercises     PT Diagnosis:    PT Problem List:   PT Treatment Interventions:      PT Goals Acute Rehab PT Goals Time For Goal Achievement: 11/17/11 PT Goal: Supine/Side to Sit - Progress: Progressing toward goal PT Goal: Sit to Supine/Side - Progress: Progressing toward goal  Visit Information  Last PT Received On: 11/05/11 Assistance Needed: +2    Subjective Data  Subjective: "75,maybe 80."  -pt rambling off topic and unable to orient.     Cognition  Overall Cognitive Status: Impaired Area of Impairment: Attention;Memory;Following commands;Safety/judgement;Awareness of deficits;Awareness of errors;Problem solving Arousal/Alertness:  (Able to be aroused, but drowsy from meds.  ) Orientation Level: Disoriented to;Place;Time;Situation (Only responds to name.) Behavior During Session: Restless Current Attention Level: Focused Following Commands: Follows one step commands inconsistently Safety/Judgement: Decreased safety judgement for tasks assessed;Impulsive;Decreased awareness of need for assistance Awareness of Errors: Assistance required to identify errors made;Assistance required to correct errors made Problem Solving: pt unable to attend to tasks for problem solving.      Balance  Balance Balance Assessed: Yes Static Sitting Balance Static Sitting - Balance Support: Right upper extremity supported;Feet supported Static Sitting - Level of Assistance: 3: Mod assist Static Sitting - Comment/# of Minutes: pt with posterior lean requiring Mod-MinA to maintain balance.  pt's decreased cognition biggest factor limiting balance and  safety.    End of Session PT - End of Session Equipment Utilized During Treatment: Oxygen;Other (comment) (L UE Sling) Activity Tolerance: Patient limited by pain (Limited by cognition deficits) Patient left: in bed;with call bell/phone within reach;with nursing in room Nurse Communication: Mobility status   GP  Sunny Schlein, [place] [phone] 11/05/2011, 2:57 PM

## 2011-11-05 NOTE — Transfer of Care (Signed)
Immediate Anesthesia Transfer of Care Note  Patient: [name]  Procedure(s) Performed: Procedure(s) (LRB) with comments: OPEN REDUCTION INTERNAL FIXATION (ORIF) PROXIMAL HUMERUS FRACTURE (Left)  Patient Location: PACU and ICU  Anesthesia Type: General  Level of Consciousness: sedated and unresponsive  Airway & Oxygen Therapy: Patient remains intubated per anesthesia plan and Patient placed on Ventilator (see vital sign flow sheet for setting)  Post-op Assessment: Report given to PACU RN and Post -op Vital signs reviewed and stable  Post vital signs: Reviewed and stable  Complications: No apparent anesthesia complications

## 2011-11-06 ENCOUNTER — Inpatient Hospital Stay (HOSPITAL_COMMUNITY): Payer: Medicare Other

## 2011-11-06 DIAGNOSIS — F10239 Alcohol dependence with withdrawal, unspecified: Secondary | ICD-10-CM | POA: Diagnosis not present

## 2011-11-06 DIAGNOSIS — R4182 Altered mental status, unspecified: Secondary | ICD-10-CM | POA: Diagnosis not present

## 2011-11-06 DIAGNOSIS — Z9181 History of falling: Secondary | ICD-10-CM | POA: Diagnosis not present

## 2011-11-06 LAB — URINALYSIS, ROUTINE W REFLEX MICROSCOPIC
Glucose, UA: NEGATIVE mg/dL
Hgb urine dipstick: NEGATIVE
Protein, ur: NEGATIVE mg/dL
Urobilinogen, UA: >8 mg/dL — ABNORMAL HIGH (ref 0.0–1.0)

## 2011-11-06 LAB — URINE MICROSCOPIC-ADD ON

## 2011-11-06 LAB — BASIC METABOLIC PANEL
BUN: 6 mg/dL (ref 6–23)
Calcium: 7.1 mg/dL — ABNORMAL LOW (ref 8.4–10.5)
Creatinine, Ser: 0.68 mg/dL (ref 0.50–1.35)
GFR calc non Af Amer: >90 mL/min (ref >90)
Glucose, Bld: 58 mg/dL — ABNORMAL LOW (ref 70–99)

## 2011-11-06 LAB — TYPE AND SCREEN

## 2011-11-06 LAB — CBC
MCH: 32 pg (ref 26.0–34.0)
MCHC: 34.3 g/dL (ref 30.0–36.0)
Platelets: 156 10*3/uL (ref 150–400)

## 2011-11-06 LAB — MAGNESIUM: Magnesium: 1.5 mg/dL (ref 1.5–2.5)

## 2011-11-06 LAB — GLUCOSE, CAPILLARY: Glucose-Capillary: 63 mg/dL — ABNORMAL LOW (ref 70–99)

## 2011-11-06 MED ORDER — LORAZEPAM 2 MG/ML IJ SOLN
1.0000 mg | INTRAMUSCULAR | Status: DC | PRN
  Administered 2011-11-06 – 2011-11-07 (×4): 2 mg via INTRAVENOUS
  Administered 2011-11-08: 4 mg via INTRAVENOUS
  Administered 2011-11-08 – 2011-11-11 (×23): 2 mg via INTRAVENOUS
  Administered 2011-11-11: 4 mg via INTRAVENOUS
  Administered 2011-11-11 – 2011-11-12 (×4): 2 mg via INTRAVENOUS
  Filled 2011-11-06 (×22): qty 1
  Filled 2011-11-06: qty 2
  Filled 2011-11-06 (×2): qty 1
  Filled 2011-11-06: qty 2
  Filled 2011-11-06 (×7): qty 1

## 2011-11-06 MED ORDER — FOLIC ACID 5 MG/ML IJ SOLN
1.0000 mg | Freq: Every day | INTRAMUSCULAR | Status: DC
  Filled 2011-11-06: qty 0.2

## 2011-11-06 MED ORDER — FOLIC ACID 1 MG PO TABS
1.0000 mg | ORAL_TABLET | Freq: Every day | ORAL | Status: DC
  Administered 2011-11-06 – 2011-11-09 (×2): 1 mg via ORAL
  Filled 2011-11-06 (×4): qty 1

## 2011-11-06 MED ORDER — PANTOPRAZOLE SODIUM 40 MG PO TBEC: 40.0000 mg | DELAYED_RELEASE_TABLET | Freq: Every day | ORAL | Status: DC

## 2011-11-06 MED ORDER — FOLIC ACID 5 MG/ML IJ SOLN
1.0000 mg | Freq: Every day | INTRAMUSCULAR | Status: DC
  Administered 2011-11-07 – 2011-11-12 (×4): 1 mg via INTRAVENOUS
  Filled 2011-11-06 (×6): qty 0.2

## 2011-11-06 MED ORDER — DEXMEDETOMIDINE HCL IN NACL 400 MCG/100ML IV SOLN
0.2000 ug/kg/h | INTRAVENOUS | Status: AC
  Administered 2011-11-06: 0.2 ug/kg/h via INTRAVENOUS
  Administered 2011-11-07 (×4): 0.7 ug/kg/h via INTRAVENOUS
  Filled 2011-11-06: qty 50
  Filled 2011-11-06: qty 100
  Filled 2011-11-06 (×3): qty 50
  Filled 2011-11-06: qty 100
  Filled 2011-11-06: qty 50
  Filled 2011-11-06: qty 100

## 2011-11-06 MED ORDER — THIAMINE HCL 100 MG/ML IJ SOLN
100.0000 mg | Freq: Every day | INTRAMUSCULAR | Status: DC
  Filled 2011-11-06: qty 1

## 2011-11-06 MED ORDER — VITAMIN B-1 100 MG PO TABS
100.0000 mg | ORAL_TABLET | Freq: Every day | ORAL | Status: DC
  Administered 2011-11-06 – 2011-11-09 (×2): 100 mg via ORAL
  Filled 2011-11-06 (×4): qty 1

## 2011-11-06 MED ORDER — WHITE PETROLATUM GEL
Status: AC
  Filled 2011-11-06: qty 5

## 2011-11-06 MED ORDER — ALBUTEROL SULFATE (5 MG/ML) 0.5% IN NEBU
2.5000 mg | INHALATION_SOLUTION | RESPIRATORY_TRACT | Status: DC | PRN
  Administered 2011-11-11: 2.5 mg via RESPIRATORY_TRACT
  Filled 2011-11-06: qty 0.5

## 2011-11-06 MED ORDER — PANTOPRAZOLE SODIUM 40 MG IV SOLR
40.0000 mg | Freq: Every day | INTRAVENOUS | Status: DC
  Administered 2011-11-07 – 2011-11-12 (×6): 40 mg via INTRAVENOUS
  Filled 2011-11-06 (×7): qty 40

## 2011-11-06 MED ORDER — THIAMINE HCL 100 MG/ML IJ SOLN
100.0000 mg | Freq: Every day | INTRAMUSCULAR | Status: DC
  Administered 2011-11-08: 13:00:00 via INTRAVENOUS
  Administered 2011-11-10 – 2011-11-12 (×3): 100 mg via INTRAVENOUS
  Filled 2011-11-06 (×6): qty 1

## 2011-11-06 MED ORDER — KCL IN DEXTROSE-NACL 20-5-0.9 MEQ/L-%-% IV SOLN
INTRAVENOUS | Status: DC
  Administered 2011-11-06: 100 mL/h via INTRAVENOUS
  Administered 2011-11-07: 05:00:00 via INTRAVENOUS
  Administered 2011-11-08: 50 mL/h via INTRAVENOUS
  Administered 2011-11-09 – 2011-11-12 (×2): via INTRAVENOUS
  Filled 2011-11-06 (×13): qty 1000

## 2011-11-06 MED ORDER — DEXTROSE 50 % IV SOLN
INTRAVENOUS | Status: AC
  Administered 2011-11-06: 25 mL via INTRAVENOUS
  Filled 2011-11-06: qty 50

## 2011-11-06 MED ORDER — DEXTROSE 50 % IV SOLN
25.0000 mL | Freq: Once | INTRAVENOUS | Status: AC | PRN
  Administered 2011-11-06: 25 mL via INTRAVENOUS

## 2011-11-06 NOTE — Progress Notes (Signed)
Name: [name] MRN: [id number] DOB: 1945/07/30  LOS: 4  CRITICAL CARE CONSULT NOTE  History of Present Illness:  66 yo admitted 10/22 after fall and found to have displaced fracture of his left humerus.  Has hx of ETOH with concern for DT's.  Had ORIF proximal Lt humerus 10/25, and remained on vent post-op.  PCCM consulted to assist with vent management.  Significant PMHx HTN, Hyperlipidemia, GERD, BPH, Anxiety/depression  Lines / Drains: Peripheral IV ETT 10/24>>10/25  Cultures / Sepsis markers: 10/23 urine culture >> 20K CFU/mL enterococcus  Antibiotics: 10/24 Ceftriaxone  >>   Tests / Events: 10/22 CT shoulder >> three part proximal humeral fracture in humeral neck and greater tuberosity with 2cm deplacement  Subjective: Still requiring multiple doses of ativan overnight.  Vital Signs:   Filed Vitals:   11/06/11 0500 11/06/11 0600 11/06/11 0700 11/06/11 0800  BP: 111/73 132/71 115/74 130/74  Pulse: 105 98 101 106  Temp:      TempSrc:      Resp: 25 29  29   Height:      Weight: 190 lb 7.6 oz (86.4 kg)     SpO2: 94% 95%  93%    Physical Examination: General - ill appearing HEENT - no sinus tenderness Cardiac - s1s2 regular, tachycardic Chest - faint wheeze b/l Abd - soft, non tender Ext - Lt arm in sling, no edema Neuro - somnolent, agitated at times, not following commands   CBC Lab Results  Component Value Date   WBC 8.6 11/06/2011   HGB 8.1* 11/06/2011   HCT 23.6* 11/06/2011   MCV 93.3 11/06/2011   PLT 156 11/06/2011     BMET Lab Results  Component Value Date   CREATININE 0.68 11/06/2011   BUN 6 11/06/2011   NA 131* 11/06/2011   K 3.4* 11/06/2011   CL 95* 11/06/2011   CO2 21 11/06/2011   LFT Lab Results  Component Value Date   ALT 35 01/29/2011   AST 78* 01/29/2011   ALKPHOS 291* 01/29/2011   BILITOT 0.7 01/29/2011    Dg Chest Port 1 View  11/05/2011  *RADIOLOGY REPORT*  Clinical Data: Check endotracheal tube.  PORTABLE CHEST - 1  VIEW  Comparison: 11/02/2011  Findings: Endotracheal tube is 3.6 cm above the carina.  There are low lung volumes.  Patchy densities in the lower lungs most likely represent atelectasis.  Heart size is likely accentuated by the portable technique and low lung volumes. Internal fixation of the proximal left humerus.  IMPRESSION: Endotracheal tube is appropriately positioned above the carina.  Low lung volumes.   Original Report Authenticated By: Richarda Overlie, M.D.    Dg Shoulder Left  11/05/2011  *RADIOLOGY REPORT*  Clinical Data: Fracture fixation.  LEFT SHOULDER - 2+ VIEW, DG C-ARM 61-120 MIN  Comparison: There is CT left shoulder 11/02/2011.  Findings: Three fluoroscopic spot views are provided.  Images demonstrate placement plate and screws for fixation of a surgical neck fracture of the humerus.  Hardware is intact.  No acute abnormality.  IMPRESSION: ORIF left surgical neck fracture.   Original Report Authenticated By: Bernadene Bell. D'ALESSIO, M.D.    Dg Shoulder Left Port  11/05/2011  *RADIOLOGY REPORT*  Clinical Data: Left humeral fracture.  PORTABLE LEFT SHOULDER - 2+ VIEW  Comparison: 11/02/2011 and 11/04/2011  Findings: Two views of the left shoulder demonstrates plate and screw fixation of the comminuted proximal humeral fracture.  The humeral head appears to be located.  Left AC joint is intact  and surgical skin staples are present.  IMPRESSION: Internal fixation of the proximal left humeral fracture.   Original Report Authenticated By: Richarda Overlie, M.D.    Dg C-arm 61-120 Min  11/05/2011  *RADIOLOGY REPORT*  Clinical Data: Fracture fixation.  LEFT SHOULDER - 2+ VIEW, DG C-ARM 61-120 MIN  Comparison: There is CT left shoulder 11/02/2011.  Findings: Three fluoroscopic spot views are provided.  Images demonstrate placement plate and screws for fixation of a surgical neck fracture of the humerus.  Hardware is intact.  No acute abnormality.  IMPRESSION: ORIF left surgical neck fracture.   Original Report  Authenticated By: Bernadene Bell. Maricela Curet, M.D.     Assessment and Plan:  Post-operative respiratory failure. Successfully extubated 10/25. P: Bronchial hygiene Adjust oxygen to keep SpO2 > 92% F/u CXR as needed  Chronic bronchitis. P: PRN albuterol  Alcohol abuse with DT's. P: CIWA-ar Ativan as needed for CIWA > 8 Thiamine, folic acid, MVI Continue IV fluids with dextrose May need to add precedex  Hx of HTN. P: PRN lopressor, hydralazine  Hyponatremia. P: Continue NS IV fluids F/u BMET  Metabolic acidosis. Resolved.  Enterococcus UTI. P: D3/3 rocephin>>d/c on 10/27  Lt humerus fracture s/p ORIF. P: Post-op care per surgery  Critical care time 35 minutes.  Keep in ICU until mental status improved.  Will place on PCCM service while in ICU.  Coralyn Helling, MD [facility] Pulmonary/Critical Care 11/06/2011, 9:31 AM Pager:  [phone] After 3pm call: [phone]

## 2011-11-06 NOTE — Procedures (Signed)
Objective Swallowing Evaluation: Modified Barium Swallowing Study  Patient Details  Name: [name] MRN: [id number] Date of Birth: 1945-04-20  Today's Date: 11/06/2011 Time: 1300-1330 SLP Time Calculation (min): 30 min  Past Medical History:  Past Medical History  Diagnosis Date  . HLD (hyperlipidemia) 5/99  . HTN (hypertension) 5/99  . BPH (benign prostatic hypertrophy) 5/99  . GERD (gastroesophageal reflux disease) 07/31/04    gastritis   . Anxiety   . Allergy     pollen/ragweed  . Adenomatous polyp of colon 2006  . Hearing difficulty   . Depression   . Humerus fracture 11/02/2011    LUE  . Peripheral vascular disease   . Pneumonia 1996    hosp  . Chronic bronchitis     "q year or q other year" (11/02/2011)  . History of blood transfusion 2012  . Anemia 2012  . Hernia, umbilical     "unrepaired" (11/02/2011)  . H/O hiatal hernia   . Memory loss     "recently has been forgetting alot; maybe related to the alcohol" (11/02/2011)  . Falls frequently     "daily lately; legs just give out" (11/02/2011)   Past Surgical History:  Past Surgical History  Procedure Date  . L duputryen contractive surgery   . Neck mri 6/04    C5-6 disc abnormality  . Colonoscopy 07/31/04    multiple polyps; repeat in 3 years  . Hosp cp r/o'd 2/24 03/08/07  . Ett myoview 03/09/07    nml EF 66%  . Upper gastrointestinal endoscopy   . Cataract extraction w/ intraocular lens  implant, bilateral 10/2002  . Tonsillectomy     "I was young" (11/02/2011)  . Hemorrhoid surgery     "cauterized a long time ago" (11/02/2011)  . Esophageal varice ligation 2012   HPI:  66 y/o male admitted s/p fall at home and found to have displaced fracture of his left humerus. Has history of ETOH with concerns for DT's.  S/p ORIF proximal Left humerous 10/25 and remained on vent post-op.  Extubated on 10/25.  Patient referred for objective evaluation of MBS to assess risk for aspiration following results of  initial BSE.       Assessment / Plan / Recommendation Clinical Impression  Dysphagia Diagnosis: Moderate oral phase dysphagia;Severe pharyngeal phase dysphagia Clinical impression: Severe sensory-motor oropharyngeal dysphagia with silent penetration and eventual aspiration during the swallow of all liquid consistencies due to reduced anterior laryngeal mobility and incomplete airway closure.  Severe residuals in vallecular space with all PO trials s/p swallow increasing risk for aspiration after the swallow.  Patient cognitively unable to follow directions to complete trial strategies of multiple swallows and chin tuck to clear residuals.  Decreased relaxation and opening of CP resulting in severe pooling in pyriforms with thin and nectar thick liquids.  Recommend NPO status with temporary means of nutrition and hydration as patient is at high risk for aspiration with even modified diet.  Recommend to repeat MBS in 2 to 3 days with clinical improvement.  Sharee Pimple patient's ability to resume PO diet good as LOA and cognitive status improves.     Treatment Recommendation  F/U MBS in ___ days (Comment);Other (Comment) (2 to 3 days with clinical improvement)    Diet Recommendation Alternative means - temporary;NPO   Medication Administration: Via alternative means    Other  Recommendations Recommended Consults: MBS Oral Care Recommendations: Oral care QID   Follow Up Recommendations  Inpatient Rehab    Frequency and  Duration min 2x/week  2 weeks       SLP Swallow Goals Goal #3: Patient will maintain functional LOA and sustained attention to complete swallow strategies necessary to prevent aspiration with diagnostic trials of puree consistency administered by SLP only with moderate verbal cues.    General Date of Onset: 11/02/11 HPI: 66 y/o male admitted s/p fall at home and found to have displaced fracture of his left humerus. Has history of ETOH with concerns for DT's.  S/p ORIF proximal Left  humerous 10/25 and remained on vent post-op.  Extubated on 10/25.  Patient referred for objective evaluation of MBS to assess risk for aspiration following results of initial BSE.   Type of Study: Modified Barium Swallowing Study Reason for Referral: Objectively evaluate swallowing function Previous Swallow Assessment: No prior reports found in EPIC Diet Prior to this Study: NPO Temperature Spikes Noted: No Respiratory Status: Room air History of Recent Intubation: Yes Length of Intubations (days): 2 days Date extubated: 11/05/11 Behavior/Cognition: Pleasant mood;Decreased sustained attention;Lethargic;Distractible;Requires cueing Oral Cavity - Dentition: Poor condition;Missing dentition Oral Motor / Sensory Function: Impaired - see Bedside swallow eval Self-Feeding Abilities: Total assist Patient Positioning: Upright in chair Baseline Vocal Quality: Hoarse;Low vocal intensity Volitional Cough: Cognitively unable to elicit Volitional Swallow: Unable to elicit Anatomy: Within functional limits Pharyngeal Secretions: Not observed secondary MBS    Reason for Referral Objectively evaluate swallowing function   Oral Phase Oral Preparation/Oral Phase Oral Phase: Impaired Oral - Honey Oral - Honey Teaspoon: Reduced posterior propulsion;Piecemeal swallowing;Right pocketing in lateral sulci;Lingual/palatal residue;Delayed oral transit;Incomplete tongue to palate contact Oral - Honey Cup: Incomplete tongue to palate contact;Reduced posterior propulsion;Piecemeal swallowing;Lingual/palatal residue;Delayed oral transit Oral - Nectar Oral - Nectar Teaspoon: Weak lingual manipulation;Incomplete tongue to palate contact;Reduced posterior propulsion;Lingual/palatal residue;Piecemeal swallowing;Delayed oral transit Oral - Nectar Cup: Incomplete tongue to palate contact;Reduced posterior propulsion;Weak lingual manipulation;Lingual/palatal residue;Piecemeal swallowing;Delayed oral transit Oral -  Thin Oral - Thin Teaspoon: Delayed oral transit;Incomplete tongue to palate contact;Reduced posterior propulsion;Piecemeal swallowing;Lingual/palatal residue Oral - Thin Cup: Incomplete tongue to palate contact;Reduced posterior propulsion;Right pocketing in lateral sulci Oral - Solids Oral - Puree: Incomplete tongue to palate contact;Reduced posterior propulsion;Weak lingual manipulation;Piecemeal swallowing;Lingual/palatal residue;Delayed oral transit   Pharyngeal Phase Pharyngeal Phase Pharyngeal Phase: Impaired Pharyngeal - Nectar Pharyngeal - Nectar Teaspoon: Reduced pharyngeal peristalsis;Reduced anterior laryngeal mobility;Reduced laryngeal elevation;Reduced airway/laryngeal closure;Reduced tongue base retraction;Penetration/Aspiration during swallow;Penetration/Aspiration after swallow;Trace aspiration;Pharyngeal residue - posterior pharnyx;Pharyngeal residue - cp segment;Pharyngeal residue - valleculae Penetration/Aspiration details (nectar teaspoon): Material enters airway, CONTACTS cords then ejected out Pharyngeal - Nectar Cup: Premature spillage to valleculae;Delayed swallow initiation;Reduced pharyngeal peristalsis;Reduced anterior laryngeal mobility;Reduced laryngeal elevation;Reduced airway/laryngeal closure;Reduced tongue base retraction;Penetration/Aspiration during swallow;Penetration/Aspiration after swallow;Trace aspiration;Pharyngeal residue - posterior pharnyx;Pharyngeal residue - valleculae;Pharyngeal residue - cp segment Penetration/Aspiration details (nectar cup): Material enters airway, passes BELOW cords without attempt by patient to eject out (silent aspiration) Pharyngeal - Thin Pharyngeal - Thin Teaspoon: Reduced airway/laryngeal closure;Premature spillage to valleculae;Reduced anterior laryngeal mobility;Reduced laryngeal elevation;Penetration/Aspiration during swallow;Reduced tongue base retraction;Pharyngeal residue - valleculae Penetration/Aspiration details (thin  teaspoon): Material enters airway, passes BELOW cords without attempt by patient to eject out (silent aspiration) Pharyngeal - Thin Cup: Delayed swallow initiation;Premature spillage to valleculae;Reduced pharyngeal peristalsis;Reduced airway/laryngeal closure;Reduced tongue base retraction;Reduced anterior laryngeal mobility;Penetration/Aspiration during swallow;Reduced laryngeal elevation;Pharyngeal residue - valleculae;Pharyngeal residue - cp segment Penetration/Aspiration details (thin cup): Material enters airway, CONTACTS cords and not ejected out;Material enters airway, passes BELOW cords without attempt by patient to eject out (silent aspiration) Pharyngeal - Solids Pharyngeal -  Puree: Delayed swallow initiation;Premature spillage to valleculae;Reduced pharyngeal peristalsis;Reduced anterior laryngeal mobility;Reduced laryngeal elevation;Reduced airway/laryngeal closure;Reduced tongue base retraction;Penetration/Aspiration during swallow;Pharyngeal residue - valleculae;Pharyngeal residue - cp segment Penetration/Aspiration details (puree): Material enters airway, remains ABOVE vocal cords then ejected out  Cervical Esophageal Phase    GO   Moreen Fowler MS, CCC-SLP [phone]   Unable to view due to positioning         [facility] 11/06/2011, 2:39 PM

## 2011-11-06 NOTE — Progress Notes (Signed)
Agitation; on CIWA for alcohol withdrawal.    We will initiate precedex

## 2011-11-06 NOTE — Significant Event (Signed)
Pt's wife reports that Mr. Nguyen has been falling frequently, and is concerned he bumped his head.  She is concerned that he could have sustained injury contributing to his altered mental status.  Will arrange for non-contrast CT head to further assess.  Coralyn Helling, MD [facility] 11/06/2011, 2:43 PM Pager:  [phone] After 3pm call: [phone]

## 2011-11-06 NOTE — Progress Notes (Signed)
Subjective: 2 Days Post-Op Procedure(s) (LRB): OPEN REDUCTION INTERNAL FIXATION (ORIF) PROXIMAL HUMERUS FRACTURE (Left) Patient reports pain as 5 on 0-10 scale.    Objective: Vital signs in last 24 hours: Temp:  [97.4 F (36.3 C)-99 F (37.2 C)] 98.5 F (36.9 C) (10/26 0410) Pulse Rate:  [98-116] 106  (10/26 0800) Resp:  [21-36] 29  (10/26 0800) BP: (95-141)/(55-123) 130/74 mmHg (10/26 0800) SpO2:  [93 %-100 %] 93 % (10/26 0800) Weight:  [86.4 kg (190 lb 7.6 oz)] 86.4 kg (190 lb 7.6 oz) (10/26 0500)  Intake/Output from previous day: 10/25 0701 - 10/26 0700 In: 1060.5 [I.V.:53.5; Blood:385; IV Piggyback:622] Out: 3105 [Urine:3105] Intake/Output this shift: Total I/O In: -  Out: 175 [Urine:175]   Basename 11/06/11 0430 11/05/11 2010 11/05/11 0241 11/04/11 0555  HGB 8.1* 8.8* 8.0* 9.2*    Basename 11/06/11 0430 11/05/11 2010  WBC 8.6 9.8  RBC 2.53* 2.67*  HCT 23.6* 25.2*  PLT 156 155    Basename 11/06/11 0430 11/05/11 0241  NA 131* 129*  K 3.4* 3.0*  CL 95* 95*  CO2 21 21  BUN 6 10  CREATININE 0.68 0.64  GLUCOSE 58* 81  CALCIUM 7.1* 7.0*    Basename 11/05/11 0241  LABPT --  INR 1.58*    Neurovascular intact Sensation intact distally Intact pulses distally Incision: scant drainage EPL functioning  Assessment/Plan: 2 Days Post-Op Procedure(s) (LRB): OPEN REDUCTION INTERNAL FIXATION (ORIF) PROXIMAL HUMERUS FRACTURE (Left) Active Problems:  HYPERTENSION  Alcohol abuse  Hyponatremia  Fracture of humerus, proximal, left, closed  Metabolic acidosis  Leukocytosis  Respiratory failure, post-operative  Delirium tremens  Currently getting a swallowing test.  Alert today and following instructions.  Marjarie Irion J 11/06/2011, 11:50 AM

## 2011-11-06 NOTE — Evaluation (Signed)
Clinical/Bedside Swallow Evaluation Patient Details  Name: [name] MRN: [id number] Date of Birth: 10-14-1945  Today's Date: 11/06/2011 Time: 4098-1191 SLP Time Calculation (min): 30 min  Past Medical History:  Past Medical History  Diagnosis Date  . HLD (hyperlipidemia) 5/99  . HTN (hypertension) 5/99  . BPH (benign prostatic hypertrophy) 5/99  . GERD (gastroesophageal reflux disease) 07/31/04    gastritis   . Anxiety   . Allergy     pollen/ragweed  . Adenomatous polyp of colon 2006  . Hearing difficulty   . Depression   . Humerus fracture 11/02/2011    LUE  . Peripheral vascular disease   . Pneumonia 1996    hosp  . Chronic bronchitis     "q year or q other year" (11/02/2011)  . History of blood transfusion 2012  . Anemia 2012  . Hernia, umbilical     "unrepaired" (11/02/2011)  . H/O hiatal hernia   . Memory loss     "recently has been forgetting alot; maybe related to the alcohol" (11/02/2011)  . Falls frequently     "daily lately; legs just give out" (11/02/2011)   Past Surgical History:  Past Surgical History  Procedure Date  . L duputryen contractive surgery   . Neck mri 6/04    C5-6 disc abnormality  . Colonoscopy 07/31/04    multiple polyps; repeat in 3 years  . Hosp cp r/o'd 2/24 03/08/07  . Ett myoview 03/09/07    nml EF 66%  . Upper gastrointestinal endoscopy   . Cataract extraction w/ intraocular lens  implant, bilateral 10/2002  . Tonsillectomy     "I was young" (11/02/2011)  . Hemorrhoid surgery     "cauterized a long time ago" (11/02/2011)  . Esophageal varice ligation 2012   HPI:  66 yo admitted 10/22 after fall and found to have displaced fracture of his left humerus.  Has hx of ETOH with concern for DT's.  Had ORIF proximal Lt humerus 10/25, and remained on vent post-op. Extubated on 11/05/11.  Patient referred for BSE to assess risk for aspiration and recommend safest, PO diet following extubation and due to AMS.    Assessment / Plan /  Recommendation Clinical Impression  Dysphagia indicated mainly impacted by weakness and AMS.  Immediate s/s of aspiration s/p swallow of thin liquid by spoon and cup.  Moderate oral residue s/p swallow of trials solids with change in vital signs.  No outward s/s of aspiration noted with puree consistency and nectar thick liquids however moderately after PO trials increase noted in RR.  Recommend  NPO status and proceed with objective evaluation of MBS to assess risk for aspiration and to provide information for POC.      Aspiration Risk  Moderate    Diet Recommendation NPO        Other  Recommendations Recommended Consults: MBS   Follow Up Recommendations  Inpatient Rehab    Frequency and Duration min 2x/week  2 weeks       SLP Swallow Goals  Goals pending results of MBS   Swallow Study Prior Functional Status   Lived at home with spouse     General Date of Onset: 11/02/11 HPI: 66 yo admitted 10/22 after fall and found to have displaced fracture of his left humerus.  Has hx of ETOH with concern for DT's.  Had ORIF proximal Lt humerus 10/25, and remained on vent post-op. Extubated on 11/05/11.  Patient referred for BSE to assess risk for aspiration and  recommend safest, PO diet . Type of Study: Bedside swallow evaluation Previous Swallow Assessment: Patient's wife denied prior reports of dysphagia  Diet Prior to this Study: Thin liquids (clear liquid diet) Temperature Spikes Noted: No Respiratory Status: Room air History of Recent Intubation: Yes Length of Intubations (days): 2 days Date extubated: 11/05/11 Behavior/Cognition: Alert;Cooperative;Pleasant mood;Decreased sustained attention;Distractible;Requires cueing Oral Cavity - Dentition: Poor condition;Missing dentition Self-Feeding Abilities: Total assist Patient Positioning: Upright in bed Baseline Vocal Quality: Hoarse;Low vocal intensity Volitional Cough: Cognitively unable to elicit Volitional Swallow: Unable to  elicit    Oral/Motor/Sensory Function Overall Oral Motor/Sensory Function: Impaired Labial ROM: Reduced left Labial Symmetry: Abnormal symmetry left Labial Strength: Reduced Labial Sensation: Reduced Lingual ROM: Reduced left Lingual Symmetry: Abnormal symmetry left Lingual Strength: Reduced Lingual Sensation: Reduced Facial ROM: Within Functional Limits Facial Symmetry: Within Functional Limits Facial Strength: Within Functional Limits Facial Sensation: Reduced Mandible: Within Functional Limits   Ice Chips Ice chips: Not tested   Thin Liquid Thin Liquid: Impaired Presentation: Cup;Spoon Pharyngeal  Phase Impairments: Suspected delayed Swallow;Cough - Immediate;Decreased hyoid-laryngeal movement;Multiple swallows;Wet Vocal Quality    Nectar Thick Nectar Thick Liquid: Impaired Presentation: Cup;Spoon Pharyngeal Phase Impairments: Suspected delayed Swallow;Decreased hyoid-laryngeal movement;Multiple swallows   Honey Thick Honey Thick Liquid: Not tested   Puree Puree: Impaired Presentation: Spoon Pharyngeal Phase Impairments: Suspected delayed Swallow;Decreased hyoid-laryngeal movement   Solid   GO    Solid: Impaired Oral Phase Impairments: Reduced lingual movement/coordination;Impaired anterior to posterior transit;Poor awareness of bolus Oral Phase Functional Implications: Oral residue Pharyngeal Phase Impairments: Suspected delayed Swallow;Decreased hyoid-laryngeal movement;Throat Clearing - Immediate;Change in Vital Signs      Moreen Fowler M.S. CCC-SLP [phone] [facility] 11/06/2011,2:12 PM

## 2011-11-07 LAB — CBC
HCT: 23.5 % — ABNORMAL LOW (ref 39.0–52.0)
MCH: 32.3 pg (ref 26.0–34.0)
MCHC: 34.5 g/dL (ref 30.0–36.0)
MCV: 93.6 fL (ref 78.0–100.0)
RDW: 16.5 % — ABNORMAL HIGH (ref 11.5–15.5)

## 2011-11-07 LAB — HEPATIC FUNCTION PANEL
ALT: 20 U/L (ref 0–53)
AST: 58 U/L — ABNORMAL HIGH (ref 0–37)
Albumin: 1.9 g/dL — ABNORMAL LOW (ref 3.5–5.2)
Alkaline Phosphatase: 221 U/L — ABNORMAL HIGH (ref 39–117)
Indirect Bilirubin: 0.9 mg/dL (ref 0.3–0.9)
Total Protein: 4.9 g/dL — ABNORMAL LOW (ref 6.0–8.3)

## 2011-11-07 LAB — BASIC METABOLIC PANEL
BUN: 5 mg/dL — ABNORMAL LOW (ref 6–23)
Calcium: 7.4 mg/dL — ABNORMAL LOW (ref 8.4–10.5)
Creatinine, Ser: 0.59 mg/dL (ref 0.50–1.35)
GFR calc Af Amer: >90 mL/min (ref >90)
GFR calc non Af Amer: >90 mL/min (ref >90)
Glucose, Bld: 131 mg/dL — ABNORMAL HIGH (ref 70–99)

## 2011-11-07 MED ORDER — DEXMEDETOMIDINE BOLUS VIA INFUSION
1.0000 ug/kg | Freq: Once | INTRAVENOUS | Status: AC
  Administered 2011-11-07: 86.4 ug via INTRAVENOUS
  Filled 2011-11-07: qty 87

## 2011-11-07 MED ORDER — DEXMEDETOMIDINE HCL IN NACL 200 MCG/50ML IV SOLN
0.2000 ug/kg/h | INTRAVENOUS | Status: AC
  Administered 2011-11-07 – 2011-11-08 (×2): 0.7 ug/kg/h via INTRAVENOUS
  Filled 2011-11-07: qty 50

## 2011-11-07 NOTE — Progress Notes (Signed)
Name: CORNELUIS ALLSTON MRN: [id number] DOB: [birth year]  LOS: 5  CRITICAL CARE CONSULT NOTE  History of Present Illness:  66 yo admitted 10/22 after fall and found to have displaced fracture of his left humerus.  Has hx of ETOH with concern for DT's.  Had ORIF proximal Lt humerus 10/25, and remained on vent post-op.  PCCM consulted to assist with vent management.  Significant PMHx HTN, Hyperlipidemia, GERD, BPH, Anxiety/depression  Lines / Drains: Peripheral IV ETT 10/24>>10/25  Cultures / Sepsis markers: 10/23 urine culture >> 20K CFU/mL enterococcus  Antibiotics: 10/24 Ceftriaxone  >>   Tests: 10/22 CT shoulder >> three part proximal humeral fracture in humeral neck and greater tuberosity with 2cm deplacement 10/26 CT head >> Intermittently degraded by motion artifact. No acute intracranial abnormality.  Events: 10/27 Started precedex  Subjective: Started precedex overnight.  Vital Signs:   Filed Vitals:   11/07/11 0700 11/07/11 0746 11/07/11 0800 11/07/11 0900  BP: 119/68  127/65   Pulse: 66  58 63  Temp:  98.3 F (36.8 C)    TempSrc:  Oral    Resp: 17  16 17   Height:      Weight:      SpO2: 100%  100% 98%    Physical Examination: General - ill appearing HEENT - no sinus tenderness Cardiac - s1s2 regular, tachycardic Chest - faint wheeze b/l Abd - soft, non tender Ext - Lt arm in sling, no edema Neuro - somnolent, agitated at times, not following commands   CBC Lab Results  Component Value Date   WBC 6.8 11/07/2011   HGB 8.1* 11/07/2011   HCT 23.5* 11/07/2011   MCV 93.6 11/07/2011   PLT 178 11/07/2011     BMET Lab Results  Component Value Date   CREATININE 0.59 11/07/2011   BUN 5* 11/07/2011   NA 133* 11/07/2011   K 3.1* 11/07/2011   CL 100 11/07/2011   CO2 26 11/07/2011   LFT Lab Results  Component Value Date   ALT 20 11/07/2011   AST 58* 11/07/2011   ALKPHOS 221* 11/07/2011   BILITOT 4.8* 11/07/2011    Ct Head Wo  Contrast  11/06/2011  *RADIOLOGY REPORT*  Clinical Data: 66 year old male with altered mental status and frequent falls.  CT HEAD WITHOUT CONTRAST  Technique:  Contiguous axial images were obtained from the base of the skull through the vertex without contrast.  Comparison: 12/26/2009.  Findings: Visualized paranasal sinuses and mastoids are clear. Study is intermittently degraded by motion artifact despite repeated imaging attempts.  No acute orbit or scalp soft tissue findings identified.  Stable benign vascular lesion at the right vertex. No acute osseous abnormality identified.  Calcified atherosclerosis at the skull base.  Stable cerebral volume.  No ventriculomegaly. No midline shift, mass effect, or evidence of mass lesion.  No evidence of cortically based acute infarction identified.  No suspicious intracranial vascular hyperdensity.  IMPRESSION: Intermittently degraded by motion artifact. No acute intracranial abnormality.   Original Report Authenticated By: Harley Hallmark, M.D.     Assessment and Plan:  Post-operative respiratory failure. Successfully extubated 10/25. P: Bronchial hygiene Adjust oxygen to keep SpO2 > 92% F/u CXR as needed  Chronic bronchitis. P: PRN albuterol  Alcohol abuse with DT's. P: CIWA-ar Ativan as needed for CIWA > 8 Thiamine, folic acid, MVI Continue IV fluids with dextrose Continue precedex  Hx of HTN. P: PRN lopressor, hydralazine  Hyponatremia. Improving. P: Continue NS IV fluids F/u BMET  Hypokalemia. P: Continue  KCL in IV fluid  Metabolic acidosis. Resolved.  Enterococcus UTI. Completed Abx 10/26  Lt humerus fracture s/p ORIF. P: Post-op care per surgery  Dysphagia. Failed MBS 10/26. P: NPO  Anemia. P: F/u CBC Transfuse for Hb < 7  Critical care time 35 minutes.  Coralyn Helling, MD [facility] Pulmonary/Critical Care 11/07/2011, 9:32 AM Pager:  [phone] After 3pm call: [phone]

## 2011-11-07 NOTE — Progress Notes (Signed)
eLink Physician-Brief Progress Note Patient Name: [name] DOB: [birth year] MRN: [id number]  Date of Service  11/07/2011   HPI/Events of Note  Continued issues of agitation/delerium related to ETOH withdrawal.  Currently on precedex at 0.7 mcg with no improvement in mental status.    eICU Interventions  Plan: 1 time bolus of precedex and change to increased dosing of 1.2 mcg If no response to this will try prn haldol   Intervention Category Major Interventions: Delirium, psychosis, severe agitation - evaluation and management  Jarrett Albor 11/07/2011, 12:20 AM

## 2011-11-07 NOTE — Progress Notes (Signed)
Pt is very agitated and keep pulling off condom cath.  Placed foley per md orders

## 2011-11-08 ENCOUNTER — Encounter (HOSPITAL_COMMUNITY): Payer: Self-pay | Admitting: Orthopedic Surgery

## 2011-11-08 DIAGNOSIS — K7030 Alcoholic cirrhosis of liver without ascites: Secondary | ICD-10-CM

## 2011-11-08 LAB — BASIC METABOLIC PANEL
BUN: 7 mg/dL (ref 6–23)
CO2: 27 mEq/L (ref 19–32)
Calcium: 7.8 mg/dL — ABNORMAL LOW (ref 8.4–10.5)
GFR calc non Af Amer: >90 mL/min (ref >90)
Glucose, Bld: 133 mg/dL — ABNORMAL HIGH (ref 70–99)
Potassium: 3.8 mEq/L (ref 3.5–5.1)
Sodium: 136 mEq/L (ref 135–145)

## 2011-11-08 LAB — URINE CULTURE
Colony Count: NO GROWTH
Culture: NO GROWTH

## 2011-11-08 LAB — CBC
Hemoglobin: 9.5 g/dL — ABNORMAL LOW (ref 13.0–17.0)
MCH: 32.4 pg (ref 26.0–34.0)
MCHC: 33.8 g/dL (ref 30.0–36.0)
MCV: 95.9 fL (ref 78.0–100.0)
RBC: 2.93 MIL/uL — ABNORMAL LOW (ref 4.22–5.81)

## 2011-11-08 LAB — MAGNESIUM: Magnesium: 1.4 mg/dL — ABNORMAL LOW (ref 1.5–2.5)

## 2011-11-08 MED ORDER — POTASSIUM CHLORIDE 10 MEQ/100ML IV SOLN
INTRAVENOUS | Status: AC
  Administered 2011-11-08: 10 meq
  Filled 2011-11-08: qty 400

## 2011-11-08 MED ORDER — POTASSIUM CHLORIDE 10 MEQ/100ML IV SOLN: 10.0000 meq | INTRAVENOUS | Status: DC

## 2011-11-08 MED ORDER — WHITE PETROLATUM GEL
Status: AC
  Administered 2011-11-08: 0.2
  Filled 2011-11-08: qty 5

## 2011-11-08 MED ORDER — MAGNESIUM SULFATE IN D5W 10-5 MG/ML-% IV SOLN
1.0000 g | Freq: Once | INTRAVENOUS | Status: AC
  Administered 2011-11-08: 1 g via INTRAVENOUS
  Filled 2011-11-08: qty 100

## 2011-11-08 NOTE — Progress Notes (Signed)
Speech Language Pathology Dysphagia Treatment Patient Details Name: [name] MRN: [id number] DOB: [birth year] Today's Date: 11/08/2011 Time: 7829-5621 SLP Time Calculation (min): 13 min  Assessment / Plan / Recommendation Clinical Impression  Pt with minimimally improved alertness from SLP report 10/26. Pt follows commands but cannot sustain alertness to PO, keeps eyes clsoed, requries verbal cues to manipulate PO. WIth 1/2 teasponn trial of puree pt with clear signs of severe residue followed by hard coughing indicative of peentration/aspiration. Pt is not yet safe for any PO consistency. Again, [name] will need to dramatically improve prior to PO intake though there is will likely be a moderate pharyngeal dysphagia even with full alertness. Suggest short term alternate nutrition if [name] does not improve later today.     Diet Recommendation  Continue with Current Diet: NPO    SLP Plan Continue with current plan of care   Pertinent Vitals/Pain NA   Swallowing Goals  SLP Swallowing Goals Goal #3: Patient will maintain LOA and sustained attention to complete swallow strategies necessary to prevent aspiration with diagnostic trials of puree consistency administered by SLP only with moderate verbal cues.  Swallow Study Goal #3 - Progress: Progressing toward goal  General Temperature Spikes Noted: No Respiratory Status: Supplemental O2 delivered via (comment) Behavior/Cognition: Pleasant mood;Lethargic;Distractible;Requires cueing;Decreased sustained attention Oral Cavity - Dentition: Poor condition;Missing dentition Patient Positioning: Upright in bed  Oral Cavity - Oral Hygiene Does patient have any of the following "at risk" factors?: Lips - dry, cracked;Mucous Membranes - reddened;Saliva - thick, dry mouth Patient is HIGH RISK - Oral Care Protocol followed (see row info): Yes   Dysphagia Treatment Treatment focused on: Upgraded PO texture trials;Facilitation of pharyngeal  phase Treatment Methods/Modalities: Skilled observation;Effortful swallow Patient observed directly with PO's: Yes Type of PO's observed: Dysphagia 1 (puree);Ice chips Feeding: Total assist Liquids provided via: Teaspoon Oral Phase Signs & Symptoms: Prolonged bolus formation;Prolonged oral phase Pharyngeal Phase Signs & Symptoms: Delayed throat clear;Delayed cough;Multiple swallows;Suspected delayed swallow initiation;Changes in respirations Type of cueing: Verbal;Tactile Amount of cueing: Maximal   GO    Sean Ditty, MA CCC-SLP [phone]  [name] 11/08/2011, 8:55 AM

## 2011-11-08 NOTE — Progress Notes (Signed)
Clinical Social Work Department BRIEF PSYCHOSOCIAL ASSESSMENT 11/08/2011  Patient:  [name], [name]     Account Number:  [id number]     Admit date:  11/02/2011  Clinical Social Worker:  Dennison Bulla  Date/Time:  11/08/2011 02:30 PM  Referred by:  Physician  Date Referred:  11/08/2011 Referred for  Substance Abuse  SNF Placement   Other Referral:   Interview type:  Patient Other interview type:   Wife via phone    PSYCHOSOCIAL DATA Living Status:  FAMILY Admitted from facility:   Level of care:   Primary support name:  [name] Primary support relationship to patient:  SPOUSE Degree of support available:   Strong    CURRENT CONCERNS Current Concerns  Post-Acute Placement  Substance Abuse   Other Concerns:    SOCIAL WORK ASSESSMENT / PLAN CSW received referral due to chronic alcohol abuse. CSW reviewed chart which stated PT recommended SNF. CSW attempted to meet with patient but patient confused and unable to answer questions appropriately. CSW spoke with wife via phone.    CSW introduced myself and explained role. Wife reports that she came to visit patient on her lunch break but had to return to work. Wife reports that husband needs additional assistance and was aware of SNF recommendation. Wife reports that patient has not been to SNF in the past but reports that [name] is close to her house. CSW explained SNF process and emailed list to wife. CSW explained insurance benefits and wife agreeable to [facility] search. Wife reports that patient needs substance abuse counseling after SNF. CSW also provided wife with these resources. CSW unable to complete SBIRT with patient at this time.    CSW submitted pasarr, completed FL2 and faxed out to [name]. CSW will follow up with bed offers.   Assessment/plan status:  Psychosocial Support/Ongoing Assessment of Needs Other assessment/ plan:   Information/referral to community resources:   SNF information    Substance abuse (outpatient and inpatient) options for after SNF    PATIENT'S/FAMILY'S RESPONSE TO PLAN OF CARE: Patient unable to participate in assessment. Wife agreeable to CSW consult and agreeable to SNF. Wife pleasant throughout assessment and agreeable for CSW to continue to follow.

## 2011-11-08 NOTE — Progress Notes (Signed)
Name: [name] MRN: [id number] DOB: 07/21/1945  LOS: 6  CRITICAL CARE  NOTE  History of Present Illness:  66 yo admitted 10/22 after fall and found to have displaced fracture of his left humerus.  Has hx of ETOH with concern for DT's.  Had ORIF proximal Lt humerus 10/25, and remained on vent post-op.  PCCM consulted to assist with vent management in the setting of DT's.   Significant PMHx HTN, Hyperlipidemia, GERD, BPH, Anxiety/depression  Lines / Drains: Peripheral IV ETT 10/24>>10/25  Cultures / Sepsis markers: 10/23 urine culture >> 20K CFU/mL enterococcus  Antibiotics: 10/24 Ceftriaxone  >> 10/26  Tests: 10/22 CT shoulder >> three part proximal humeral fracture in humeral neck and greater tuberosity with 2cm deplacement 10/26 CT head >> Intermittently degraded by motion artifact. No acute intracranial abnormality.  Events: 10/27 Started precedex  Subjective: On precedex  Vital Signs:   Filed Vitals:   11/08/11 0500 11/08/11 0600 11/08/11 0700 11/08/11 0855  BP: 111/70 97/59 117/77   Pulse: 61 65 64   Temp:    97.3 F (36.3 C)  TempSrc:    Oral  Resp: 15 24 17    Height:      Weight:      SpO2: 100% 99% 99%     Physical Examination: General - ill appearing HEENT - moist mucous membranes Cardiac - s1s2 regular rate and rhythm Chest - normal work of breathing, no wheezing Abd - soft, non tender Ext - Lt arm in sling, no edema Neuro - alert and oriented x4   CBC Lab Results  Component Value Date   WBC 6.2 11/08/2011   HGB 9.5* 11/08/2011   HCT 28.1* 11/08/2011   MCV 95.9 11/08/2011   PLT 198 11/08/2011     BMET Lab Results  Component Value Date   CREATININE 0.59 11/07/2011   BUN 5* 11/07/2011   NA 133* 11/07/2011   K 3.1* 11/07/2011   CL 100 11/07/2011   CO2 26 11/07/2011   LFT Lab Results  Component Value Date   ALT 20 11/07/2011   AST 58* 11/07/2011   ALKPHOS 221* 11/07/2011   BILITOT 4.8* 11/07/2011    Ct Head Wo  Contrast  11/06/2011  *RADIOLOGY REPORT*  Clinical Data: 66 year old male with altered mental status and frequent falls.  CT HEAD WITHOUT CONTRAST  Technique:  Contiguous axial images were obtained from the base of the skull through the vertex without contrast.  Comparison: 12/26/2009.  Findings: Visualized paranasal sinuses and mastoids are clear. Study is intermittently degraded by motion artifact despite repeated imaging attempts.  No acute orbit or scalp soft tissue findings identified.  Stable benign vascular lesion at the right vertex. No acute osseous abnormality identified.  Calcified atherosclerosis at the skull base.  Stable cerebral volume.  No ventriculomegaly. No midline shift, mass effect, or evidence of mass lesion.  No evidence of cortically based acute infarction identified.  No suspicious intracranial vascular hyperdensity.  IMPRESSION: Intermittently degraded by motion artifact. No acute intracranial abnormality.   Original Report Authenticated By: Harley Hallmark, M.D.     Assessment and Plan:  Post-operative respiratory failure. Successfully extubated 10/25. P: F/u CXR as needed  Chronic bronchitis. P: PRN albuterol  Alcohol abuse with DT's. P: Wean off precedex with ativan 1- 4 coverage for agitation.  Thiamine, folic acid, MVI Continue IV fluids with dextrose  Hx of HTN: currently normotensive P: PRN lopressor, hydralazine  Hyponatremia. Improved on 10/27. P: F/u am BMET Continue NS IV fluids  Hypokalemia. P: F/u this am BMP. Replete as needed. Check Magnesium.  Continue KCL in IV fluid  Metabolic acidosis. Resolved.  Enterococcus UTI. Completed Abx 10/26  Lt humerus fracture s/p ORIF. P: Post-op care per surgery  Dysphagia. Failed MBS 10/26 and 10/27. There could be a component of sedation.  P: NPO Repeat speech eval on 10/29 once off precedex  Anemia. P: F/u CBC Transfuse for Hb < 7  BEST PRACTICE / DISPOSITION  Level of Care: ICU   Primary Service: PCCM  Consultants: orthopedic surgery Code Status: DNR Diet: NPO  DVT Px: lovenox GI Px: ppi  Skin Integrity: Intact  Social / Family: No family available 10/28  Marena Chancy, PGY-2 Family Medicine Resident ICU rotation   Cyril Mourning MD. [facility] Pager [phone] If no response call [phone]

## 2011-11-08 NOTE — Progress Notes (Signed)
Orthopaedic Trauma Service (OTS)  Subjective: 4 Days Post-Op Procedure(s) (LRB): OPEN REDUCTION INTERNAL FIXATION (ORIF) PROXIMAL HUMERUS FRACTURE (Left)    Remains in ICU, on precedex drip  Objective: Current Vitals Blood pressure 117/77, pulse 64, temperature 97.3 F (36.3 C), temperature source Oral, resp. rate 17, height 5\' 9"  (1.753 m), weight 83.2 kg (183 lb 6.8 oz), SpO2 99.00%. Vital signs in last 24 hours: Temp:  [97.3 F (36.3 C)-98.5 F (36.9 C)] 97.3 F (36.3 C) (10/28 0855) Pulse Rate:  [60-105] 64  (10/28 0700) Resp:  [15-30] 17  (10/28 0700) BP: (97-138)/(59-96) 117/77 mmHg (10/28 0700) SpO2:  [94 %-100 %] 99 % (10/28 0700)  Intake/Output from previous day: 10/27 0701 - 10/28 0700 In: 4 [IV Piggyback:4] Out: 1215 [Urine:1215]  LABS  Basename 11/07/11 0804 11/06/11 0430 11/05/11 2010  HGB 8.1* 8.1* 8.8*    Basename 11/07/11 0804 11/06/11 0430  WBC 6.8 8.6  RBC 2.51* 2.53*  HCT 23.5* 23.6*  PLT 178 156    Basename 11/07/11 0804 11/06/11 0430  NA 133* 131*  K 3.1* 3.4*  CL 100 95*  CO2 26 21  BUN 5* 6  CREATININE 0.59 0.68  GLUCOSE 131* 58*  CALCIUM 7.4* 7.1*   No results found for this basename: LABPT:2,INR:2 in the last 72 hours   Physical Exam  Gen: confused, soft restraints in place. Do not comprehend his speech  Pt looks physically different to me today, ? Some anasarca Ext:   Left Upper Extremity   Incision looks great   Sling in place   Not cooperative with exam   Ext warm   Ecchymosis stable   Imaging Ct Head Wo Contrast  11/06/2011  *RADIOLOGY REPORT*  Clinical Data: 66 year old male with altered mental status and frequent falls.  CT HEAD WITHOUT CONTRAST  Technique:  Contiguous axial images were obtained from the base of the skull through the vertex without contrast.  Comparison: 12/26/2009.  Findings: Visualized paranasal sinuses and mastoids are clear. Study is intermittently degraded by motion artifact despite repeated  imaging attempts.  No acute orbit or scalp soft tissue findings identified.  Stable benign vascular lesion at the right vertex. No acute osseous abnormality identified.  Calcified atherosclerosis at the skull base.  Stable cerebral volume.  No ventriculomegaly. No midline shift, mass effect, or evidence of mass lesion.  No evidence of cortically based acute infarction identified.  No suspicious intracranial vascular hyperdensity.  IMPRESSION: Intermittently degraded by motion artifact. No acute intracranial abnormality.   Original Report Authenticated By: Harley Hallmark, M.D.     Assessment/Plan: 4 Days Post-Op Procedure(s) (LRB): OPEN REDUCTION INTERNAL FIXATION (ORIF) PROXIMAL HUMERUS FRACTURE (Left)  66 y/o male s/p ground level fall due to EtOH abuse  1. Fall 2. Chronic EtOH abuse, DT's 3. L proximal humerus fx, POD 4  NWB  L arm  Dressing changes PRN  Initiate therapies when medically stable  Ice prn 4. Continue per CCM 5. Dvt/pe prophylaxis  lovenox- will not need to be on lovenox at discharge from ortho standpoint  6. dispo  Continue per CCM   Mearl Latin, PA-C [facility] [phone] (P) 11/08/2011, 9:31 AM

## 2011-11-08 NOTE — Progress Notes (Signed)
11/08/11 1800  Clinical Encounter Type  Visited With Patient  Visit Type Initial   Visited with the patient. I will follow up. Chaplain  Vonzella Nipple

## 2011-11-08 NOTE — Progress Notes (Addendum)
Clinical Social Work Department CLINICAL SOCIAL WORK PLACEMENT NOTE 11/08/2011  Patient:  KASHMIR, LYSAGHT  Account Number:  [id number] Admit date:  11/02/2011  Clinical Social Worker:  Unk Lightning, LCSW  Date/time:  11/08/2011 03:00 PM  Clinical Social Work is seeking post-discharge placement for this patient at the following level of care:   SKILLED NURSING   (*CSW will update this form in Epic as items are completed)   11/08/2011  Patient/family provided with [facility] Department of Clinical Social Work's list of facilities offering this level of care within the geographic area requested by the patient (or if unable, by the patient's family).  11/08/2011  Patient/family informed of their freedom to choose among providers that offer the needed level of care, that participate in Medicare, Medicaid or managed care program needed by the patient, have an available bed and are willing to accept the patient.  11/08/2011  Patient/family informed of [facility]' ownership interest in [facility], as well as of the fact that they are under no obligation to receive care at this facility.  PASARR submitted to EDS on 11/08/2011 PASARR number received from [employer] on 11/08/2011  FL2 transmitted to all facilities in geographic area requested by pt/family on  11/08/2011 FL2 transmitted to all facilities within larger geographic area on   Patient informed that his/her managed care company has contracts with or will negotiate with  certain facilities, including the following:     Patient/family informed of bed offers received:  11/12/11 Patient chooses bed at [facility] Physician recommends and patient chooses bed at    Patient to be transferred to [facility] on  11/16/11 Patient to be transferred to facility by [facility]  The following physician request were entered in Epic:   Additional Comments:

## 2011-11-08 NOTE — Progress Notes (Signed)
Patient able to answer some questions correctly, when giving choices.  Oriented to Person, Place and who the President is.

## 2011-11-08 NOTE — Progress Notes (Signed)
Physical Therapy Treatment Patient Details Name: [name] MRN: [id number] DOB: 1945/10/31 Today's Date: 11/08/2011 Time: 1478-2956 PT Time Calculation (min): 24 min  PT Assessment / Plan / Recommendation Comments on Treatment Session  pt presents with L humerus fx s/p ORIF and hx Etoh.  pt requiring 2 person A for all mobility.  pt with better cognition today and participating with PT, however very confused and with no awareness of deficits and safety.      Follow Up Recommendations  Post acute inpatient     Does the patient have the potential to tolerate intense rehabilitation  No, Recommend SNF  Barriers to Discharge        Equipment Recommendations  None recommended by PT    Recommendations for Other Services OT consult  Frequency Min 5X/week   Plan Discharge plan remains appropriate;Frequency remains appropriate    Precautions / Restrictions Precautions Precautions: Fall Required Braces or Orthoses: Other Brace/Splint (Left UE sling) Restrictions Weight Bearing Restrictions: Yes LUE Weight Bearing: Non weight bearing   Pertinent Vitals/Pain Denies pain.      Mobility  Bed Mobility Bed Mobility: Supine to Sit;Sitting - Scoot to Edge of Bed Supine to Sit: 1: +2 Total assist Supine to Sit: Patient Percentage: 40% Sitting - Scoot to Edge of Bed: 3: Mod assist Details for Bed Mobility Assistance: pt doing well following simple one step directions, but needs repeated cueing secondary to decreased attention.   Transfers Transfers: Sit to Stand;Stand to Sit;Stand Pivot Transfers Sit to Stand: 1: +2 Total assist;With upper extremity assist;From bed Sit to Stand: Patient Percentage: 30% Stand to Sit: 1: +2 Total assist;With upper extremity assist;To chair/3-in-1;With armrests Stand to Sit: Patient Percentage: 30% Stand Pivot Transfers: 1: +2 Total assist Stand Pivot Transfers: Patient Percentage: 40% Details for Transfer Assistance: Step-by-step cueing for safe  technique, attending to task.   Ambulation/Gait Ambulation/Gait Assistance: Not tested (comment) Stairs: No Wheelchair Mobility Wheelchair Mobility: No    Exercises     PT Diagnosis:    PT Problem List:   PT Treatment Interventions:     PT Goals Acute Rehab PT Goals Time For Goal Achievement: 11/17/11 PT Goal: Supine/Side to Sit - Progress: Progressing toward goal PT Goal: Sit to Stand - Progress: Progressing toward goal PT Goal: Stand to Sit - Progress: Progressing toward goal PT Goal: Stand - Progress: Progressing toward goal  Visit Information  Last PT Received On: 11/08/11 Assistance Needed: +2    Subjective Data  Subjective: [place]     Cognition  Overall Cognitive Status: Impaired Area of Impairment: Attention;Memory;Following commands;Safety/judgement;Awareness of errors;Awareness of deficits;Problem solving Arousal/Alertness: Awake/alert Orientation Level: Disoriented to;Place;Time;Situation Behavior During Session: Flat affect Current Attention Level: Sustained Memory Deficits: pt with no recall of situation or events since being in hospital.   Following Commands: Follows one step commands with increased time Safety/Judgement: Decreased safety judgement for tasks assessed;Impulsive;Decreased awareness of need for assistance Awareness of Errors: Assistance required to identify errors made;Assistance required to correct errors made Awareness of Deficits: pt unaware of L humerus fx    Balance  Balance Balance Assessed: No  End of Session PT - End of Session Equipment Utilized During Treatment: Gait belt;Oxygen (L UE sling) Activity Tolerance:  (Limited by cognition) Patient left: in chair;with call bell/phone within reach Nurse Communication: Mobility status   GP     Sunny Schlein, [place] [phone] 11/08/2011, 12:07 PM

## 2011-11-09 ENCOUNTER — Inpatient Hospital Stay (HOSPITAL_COMMUNITY): Payer: Medicare Other

## 2011-11-09 DIAGNOSIS — E871 Hypo-osmolality and hyponatremia: Secondary | ICD-10-CM

## 2011-11-09 DIAGNOSIS — R7989 Other specified abnormal findings of blood chemistry: Secondary | ICD-10-CM | POA: Diagnosis not present

## 2011-11-09 DIAGNOSIS — K8020 Calculus of gallbladder without cholecystitis without obstruction: Secondary | ICD-10-CM | POA: Diagnosis not present

## 2011-11-09 LAB — COMPREHENSIVE METABOLIC PANEL
AST: 96 U/L — ABNORMAL HIGH (ref 0–37)
Albumin: 2.2 g/dL — ABNORMAL LOW (ref 3.5–5.2)
Calcium: 8 mg/dL — ABNORMAL LOW (ref 8.4–10.5)
Creatinine, Ser: 0.62 mg/dL (ref 0.50–1.35)

## 2011-11-09 LAB — CBC
MCH: 33.3 pg (ref 26.0–34.0)
MCHC: 34.4 g/dL (ref 30.0–36.0)
MCV: 96.9 fL (ref 78.0–100.0)
Platelets: 286 10*3/uL (ref 150–400)
RDW: 17.6 % — ABNORMAL HIGH (ref 11.5–15.5)

## 2011-11-09 LAB — MAGNESIUM: Magnesium: 1.5 mg/dL (ref 1.5–2.5)

## 2011-11-09 MED ORDER — MAGNESIUM SULFATE IN D5W 10-5 MG/ML-% IV SOLN
1.0000 g | Freq: Once | INTRAVENOUS | Status: AC
  Administered 2011-11-09: 1 g via INTRAVENOUS
  Filled 2011-11-09: qty 100

## 2011-11-09 MED ORDER — SODIUM CHLORIDE 0.9 % IV BOLUS (SEPSIS)
500.0000 mL | Freq: Once | INTRAVENOUS | Status: AC
  Administered 2011-11-09: 500 mL via INTRAVENOUS

## 2011-11-09 MED ORDER — BIOTENE DRY MOUTH MT LIQD
15.0000 mL | Freq: Two times a day (BID) | OROMUCOSAL | Status: DC
  Administered 2011-11-10 – 2011-11-16 (×11): 15 mL via OROMUCOSAL

## 2011-11-09 MED ORDER — CHLORHEXIDINE GLUCONATE 0.12 % MT SOLN
15.0000 mL | Freq: Two times a day (BID) | OROMUCOSAL | Status: DC
  Administered 2011-11-09 – 2011-11-16 (×11): 15 mL via OROMUCOSAL
  Filled 2011-11-09 (×16): qty 15

## 2011-11-09 NOTE — Progress Notes (Signed)
Nutrition Follow-up  Intervention:    Recommend place enteral feeding tube and start TF with Jevity 1.2 at 25 ml/h, increase by 10 ml every 4 hours to goal rate of 75 ml/h to provide 2160 kcals, 100 gm protein, 1458 ml free water daily.  Assessment:   Patient S/P MBS 10/26.  Remains NPO with high risk for aspiration per SLP notes.  SLP recommends short term alternate nutrition.  Patient is confused.  Patient is at nutrition risk given suboptimal oral intake since admission with no oral intake since 10/24.  Diet Order:  NPO  Meds: Scheduled Meds:   . enoxaparin (LOVENOX) injection  40 mg Subcutaneous Q24H  . folic acid  1 mg Oral Daily   Or  . folic acid  1 mg Intravenous Daily  . magnesium sulfate 1 - 4 g bolus IVPB  1 g Intravenous Once  . magnesium sulfate 1 - 4 g bolus IVPB  1 g Intravenous Once  . multivitamin with minerals  1 tablet Oral Daily  . pantoprazole  40 mg Oral Q1200   Or  . pantoprazole (PROTONIX) IV  40 mg Intravenous Q1200  . sodium chloride  500 mL Intravenous Once  . thiamine  100 mg Oral Daily   Or  . thiamine  100 mg Intravenous Daily  . white petrolatum       Continuous Infusions:   . dexmedetomidine 0.702 mcg/kg/hr (11/08/11 0700)  . dextrose 5 % and 0.9 % NaCl with KCl 20 mEq/L 75 mL/hr at 11/09/11 1148   PRN Meds:.albuterol, fentaNYL, hydrALAZINE, LORazepam, metoprolol, ondansetron (ZOFRAN) IV, promethazine   CMP     Component Value Date/Time   NA 138 11/09/2011 0440   K 4.4 11/09/2011 0440   CL 103 11/09/2011 0440   CO2 25 11/09/2011 0440   GLUCOSE 94 11/09/2011 0440   BUN 7 11/09/2011 0440   CREATININE 0.62 11/09/2011 0440   CALCIUM 8.0* 11/09/2011 0440   PROT 5.8* 11/09/2011 0440   ALBUMIN 2.2* 11/09/2011 0440   AST 96* 11/09/2011 0440   ALT 30 11/09/2011 0440   ALKPHOS 294* 11/09/2011 0440   BILITOT 4.6* 11/09/2011 0440   GFRNONAA >90 11/09/2011 0440   GFRAA >90 11/09/2011 0440    Sodium  Date/Time Value Range Status  11/09/2011   4:40 AM 138  135 - 145 mEq/L Final  11/08/2011  9:25 AM 136  135 - 145 mEq/L Final  11/07/2011  8:04 AM 133* 135 - 145 mEq/L Final    Potassium  Date/Time Value Range Status  11/09/2011  4:40 AM 4.4  3.5 - 5.1 mEq/L Final     HEMOLYSIS AT THIS LEVEL MAY AFFECT RESULT  11/08/2011  9:25 AM 3.8  3.5 - 5.1 mEq/L Final  11/07/2011  8:04 AM 3.1* 3.5 - 5.1 mEq/L Final    Phosphorus  Date/Time Value Range Status  11/07/2011  8:04 AM 2.4  2.3 - 4.6 mg/dL Final  01/16/1094  0:45 PM 3.1  2.3 - 4.6 mg/dL Final  40/98/1191  4:78 AM 2.8  2.3 - 4.6 mg/dL Final    Magnesium  Date/Time Value Range Status  11/09/2011  4:40 AM 1.5  1.5 - 2.5 mg/dL Final  29/56/2130  8:65 AM 1.4* 1.5 - 2.5 mg/dL Final  78/46/9629  5:28 AM 1.5  1.5 - 2.5 mg/dL Final    CBG (last 3)   Basename 11/07/11 0732  GLUCAP 128*     Intake/Output Summary (Last 24 hours) at 11/09/11 1532 Last data filed at 11/09/11  1400  Gross per 24 hour  Intake   1130 ml  Output    730 ml  Net    400 ml    Weight Status:  86.5 kg up slightly from 81.6 kg on 10/23  Re-estimated needs:  2040-2290 kcals, 90-105 gm protein, >2 liters fluid daily  Nutrition Dx:  Inadequate oral intake now related to difficulty swallowing as evidenced by NPO diet.  Goal:  Intake to meet >90% of estimated nutrition needs.  Monitor:  Initiation of TF vs PO diet, weight trend, labs.    Joaquin Courts, RD, LDN, CNSC Pager# [phone] After Hours Pager# [phone]

## 2011-11-09 NOTE — Progress Notes (Signed)
Physical Therapy Treatment Patient Details Name: [name] MRN: [id number] DOB: 07/29/1945 Today's Date: 11/09/2011 Time: 7829-5621 PT Time Calculation (min): 13 min  PT Assessment / Plan / Recommendation Comments on Treatment Session  Pt with lt humerus fx due to fall and s/p ORIF.  Pt with hx of ETOH abuse.  Unable to achieve standing from recliner due to heavy posterior lean.  Recommended nursing use maxisky for back to bed.    Follow Up Recommendations  Post acute inpatient     Does the patient have the potential to tolerate intense rehabilitation  No, Recommend SNF  Barriers to Discharge        Equipment Recommendations  None recommended by PT    Recommendations for Other Services    Frequency Min 3X/week   Plan Discharge plan remains appropriate;Frequency needs to be updated    Precautions / Restrictions Precautions Precautions: Fall Restrictions LUE Weight Bearing: Non weight bearing   Pertinent Vitals/Pain Pt with c/o pain in back and stomach with attempts to bring trunk forward in chair.    Mobility  Transfers Details for Transfer Assistance: attempted to stand from chair with 2 person total assist but unable to clear hips from chair.  Pt with strong posterior lean.    Exercises     PT Diagnosis:    PT Problem List:   PT Treatment Interventions:     PT Goals Acute Rehab PT Goals PT Goal: Sit to Stand - Progress: Not progressing PT Goal: Stand to Sit - Progress: Not progressing PT Goal: Stand - Progress: Not progressing  Visit Information  Last PT Received On: 11/09/11 Assistance Needed: +2    Subjective Data  Subjective: "I am going to walk home," pt stated even after I pointed out that he couldn't even stand with [place].   Cognition  Arousal/Alertness: Awake/alert Orientation Level: Disoriented to;Place;Time;Situation Behavior During Session: Restless Current Attention Level: Focused Following Commands: Follows one step commands  inconsistently Safety/Judgement: Decreased awareness of safety precautions;Decreased awareness of need for assistance;Decreased safety judgement for tasks assessed Awareness of Errors: Assistance required to identify errors made;Assistance required to correct errors made Cognition - Other Comments: Pt with no awareness of deficits.    Balance  Static Sitting Balance Static Sitting - Balance Support: Right upper extremity supported;Feet supported Static Sitting - Level of Assistance: 1: +1 Total assist Static Sitting - Comment/# of Minutes: Pt sitting in chair.  Pt required total assist to bring trunk forward from back of chair due to heavy posterior lean.  Had pt reach forward with rt arm and hold onto armrest of chair in front of him to facilitate anterior trunk lean.  End of Session PT - End of Session Activity Tolerance: Other (comment) (coginition) Patient left: in chair;with call bell/phone within reach Nurse Communication: Mobility status;Need for lift equipment   GP     [place] 11/09/2011, 2:33 PM  [facility] PT [phone]

## 2011-11-09 NOTE — Progress Notes (Signed)
Orthopaedic Trauma Service (OTS)  Subjective: 5 Days Post-Op Procedure(s) (LRB): OPEN REDUCTION INTERNAL FIXATION (ORIF) PROXIMAL HUMERUS FRACTURE (Left)  ICU, sitting in bedside chair  Objective: Current Vitals Blood pressure 129/85, pulse 125, temperature 98 F (36.7 C), temperature source Oral, resp. rate 20, height 5\' 9"  (1.753 m), weight 86.5 kg (190 lb 11.2 oz), SpO2 95.00%. Vital signs in last 24 hours: Temp:  [97.3 F (36.3 C)-98.3 F (36.8 C)] 98 F (36.7 C) (10/29 0800) Pulse Rate:  [60-132] 125  (10/29 0800) Resp:  [16-26] 20  (10/29 0800) BP: (87-129)/(48-88) 129/85 mmHg (10/29 0800) SpO2:  [60 %-100 %] 95 % (10/29 0800) Weight:  [86.5 kg (190 lb 11.2 oz)] 86.5 kg (190 lb 11.2 oz) (10/29 0500)  Intake/Output from previous day: 10/28 0701 - 10/29 0700 In: 1296.6 [I.V.:1092.6; IV Piggyback:204] Out: 620 [Urine:620] Intake/Output      10/28 0701 - 10/29 0700 10/29 0701 - 10/30 0700   I.V. (mL/kg) 1092.6 (12.6) 50 (0.6)   IV Piggyback 204    Total Intake(mL/kg) 1296.6 (15) 50 (0.6)   Urine (mL/kg/hr) 620 (0.3) 75   Total Output 620 75   Net +676.6 -25          LABS  Basename 11/09/11 0440 11/08/11 0925 11/07/11 0804  HGB 10.9* 9.5* 8.1*    Basename 11/09/11 0440 11/08/11 0925  WBC 7.1 6.2  RBC 3.27* 2.93*  HCT 31.7* 28.1*  PLT 286 198    Basename 11/09/11 0440 11/08/11 0925  NA 138 136  K 4.4 3.8  CL 103 103  CO2 25 27  BUN 7 7  CREATININE 0.62 0.55  GLUCOSE 94 133*  CALCIUM 8.0* 7.8*    Basename 11/08/11 0940  LABPT --  INR 1.34    Physical Exam  Gen: confused, sitting in bedside chair Ext: Left upper Extremity  Wound stable  Exam unchanged  Swelling stable   Imaging No results found.  Assessment/Plan: 5 Days Post-Op Procedure(s) (LRB): OPEN REDUCTION INTERNAL FIXATION (ORIF) PROXIMAL HUMERUS FRACTURE (Left)  66 y/o male s/p ground level fall due to EtOH abuse  1. Fall   2. Chronic EtOH abuse, DT's  3. L proximal  humerus fx, POD 4   NWB L arm   Dressing changes PRN   Initiate therapies when medically stable   Ice prn  4. Continue per CCM  5. Dvt/pe prophylaxis   lovenox- will not need to be on lovenox at discharge from ortho standpoint  6. dispo   Continue per CCM    Mearl Latin, PA-C [facility] [phone] (P) 11/09/2011, 9:23 AM

## 2011-11-09 NOTE — Progress Notes (Signed)
Message was left for wife regarding bed offers which were emailed to her at her personal e-mail address.  Bed offers included:[place], [facility], [address], [place], [address], [place], [place], and [place].  Carney Bern, LCSWA

## 2011-11-09 NOTE — Progress Notes (Signed)
Speech Language Pathology Dysphagia Treatment Patient Details Name: PETRO TALENT MRN: [id number] DOB: Nov 17, 1945 Today's Date: 11/09/2011 Time: 1140-1200 SLP Time Calculation (min): 20 min  Assessment / Plan / Recommendation Clinical Impression  Pt more awake today, sitting up in chair but still severely confused. Pt cannot follow simple one step directions, easily disratcted, constantly talking. Pt with reduced awareness of bolus, delayed swallow reponse, multiple swallow with wet, gurgling voice inbetween. 1/2 teaspoon of pudding resulted in overt signs of aspiration. SLP positioned pt in chin tuck position with consistent cues for multiple swallows. Again deficits on MBS indicate a baseline dysphagia worsened by weakness and confusion. Pt will not be able to consume any PO until mental status dramatically improved. Suggest short term alternate nutrition until pt shows improvement in following commands and attention at bedside. SLP will /fu tomorrow.     Diet Recommendation  Continue with Current Diet: NPO    SLP Plan Continue with current plan of care   Pertinent Vitals/Pain NA   Swallowing Goals  SLP Swallowing Goals Goal #3: Patient will maintain LOA and sustained attention to complete swallow strategies necessary to prevent aspiration with diagnostic trials of puree consistency administered by SLP only with moderate verbal cues.  Swallow Study Goal #3 - Progress: Progressing toward goal  General Temperature Spikes Noted: No Respiratory Status: Supplemental O2 delivered via (comment) Behavior/Cognition: Confused;Lethargic;Distractible;Doesn't follow directions;Decreased sustained attention Oral Cavity - Dentition: Poor condition;Missing dentition Patient Positioning: Upright in chair  Oral Cavity - Oral Hygiene     Dysphagia Treatment Treatment focused on: Upgraded PO texture trials Treatment Methods/Modalities: Effortful swallow;Skilled observation;Differential diagnosis  (chin tuck) Patient observed directly with PO's: Yes Type of PO's observed: Pudding-thick liquids;Ice chips Feeding: Total assist Liquids provided via: Teaspoon Oral Phase Signs & Symptoms: Prolonged bolus formation;Prolonged oral phase Pharyngeal Phase Signs & Symptoms: Suspected delayed swallow initiation;Multiple swallows;Wet vocal quality;Immediate cough Type of cueing: Verbal;Tactile Amount of cueing: Maximal   GO    Harlon Ditty, MA CCC-SLP [phone]  Claudine Mouton 11/09/2011, 12:17 PM

## 2011-11-09 NOTE — Progress Notes (Signed)
Name: [name] MRN: [id number] DOB: 16-Jul-1945  LOS: 7  CRITICAL CARE  NOTE  History of Present Illness:  66 yo admitted 10/22 after fall and found to have displaced fracture of his left humerus.  Has hx of ETOH with concern for DT's.  Had ORIF proximal Lt humerus 10/25, and remained on vent post-op.  PCCM consulted to assist with vent management in the setting of DT's.   Significant PMHx HTN, Hyperlipidemia, GERD, BPH, Anxiety/depression  Lines / Drains: Peripheral IV ETT 10/24>>10/25  Cultures / Sepsis markers: 10/23 urine culture >> 20K CFU/mL enterococcus  Antibiotics: 10/24 Ceftriaxone  >> 10/26  Tests: 10/22 CT shoulder >> three part proximal humeral fracture in humeral neck and greater tuberosity with 2cm deplacement 10/26 CT head >> Intermittently degraded by motion artifact. No acute intracranial abnormality.  Events: 10/27  precedex gtt 10/28 precedex weaned off  Subjective: remains confused, ativan q 2h   Vital Signs:   Filed Vitals:   11/09/11 0400 11/09/11 0405 11/09/11 0500 11/09/11 0600  BP: 124/80  121/69 112/67  Pulse: 114  104 109  Temp:  98.2 F (36.8 C)    TempSrc:  Oral    Resp: 22  18 16   Height:      Weight:   190 lb 11.2 oz (86.5 kg)   SpO2: 99%  100% 97%    Physical Examination: General - ill appearing HEENT - moist mucous membranes Cardiac - s1s2 regular rate and rhythm Chest - normal work of breathing, no wheezing Abd - soft, non tender in upper quadrant Ext - Lt arm in sling, no edema, right peripheral IV leaking Neuro - alert, not oriented, confused   CBC Lab Results  Component Value Date   WBC 7.1 11/09/2011   HGB 10.9* 11/09/2011   HCT 31.7* 11/09/2011   MCV 96.9 11/09/2011   PLT 286 11/09/2011     BMET Lab Results  Component Value Date   CREATININE 0.62 11/09/2011   BUN 7 11/09/2011   NA 138 11/09/2011   K 4.4 11/09/2011   CL 103 11/09/2011   CO2 25 11/09/2011   LFT Lab Results  Component Value Date   ALT 30 11/09/2011   AST 96* 11/09/2011   ALKPHOS 294* 11/09/2011   BILITOT 4.6* 11/09/2011    No results found.  Assessment and Plan:  Post-operative respiratory failure. Successfully extubated 10/25.  Chronic bronchitis. P: PRN albuterol  Alcohol abuse with DT's: off precedex since 10/28 P: Requiring ativan 2mg  q2/prn. Continue ativan as needed for agitation Thiamine, folic acid, MVI Increase IV fluids with dextrose to 75cc/hr  Hx of HTN: currently normotensive with tachycardia.  Oliguria P: 500cc bolus now PRN lopressor, hydralazine  Hyponatremia. resolved P: F/u am BMET D5NS at 75cc/hr  Hypokalemia: resolved P: Hypomagnesemia: replace with 1g Mg Am BMP  Metabolic acidosis. Resolved.  Enterococcus UTI. Completed Abx 10/26  Lt humerus fracture s/p ORIF. P: Post-op care per surgery. On lovenox for prophylaxis. No recommendation for lovenox after discharge  Dysphagia. Failed MBS 10/26 and 10/27. There could be a component of sedation.  P: NPO F/u speech eval 10/29. If doesn't pass, will need NG tube  Hyperbilirubinemia with elevated ATS/ALT and rising  alk phos:  P:  Right upper quadrant [place] CMP in am  Anemia. P: F/u CBC Transfuse for Hb < 7   BEST PRACTICE / DISPOSITION  Level of Care: ICU  Primary Service: PCCM  Consultants: orthopedic surgery Code Status: DNR Diet: NPO: pending swallow study  DVT Px: lovenox GI Px: ppi  Skin Integrity: Intact  Social / Family: wife updated 10/29  OK to transfer to SDU (high ativan requirement) - [facility] to pick up 10/30  Marena Chancy, PGY-2 Family Medicine Resident ICU rotation  Cyril Mourning MD. [facility]. [place] [facility] Pager [phone] If no response call [phone]

## 2011-11-10 DIAGNOSIS — F411 Generalized anxiety disorder: Secondary | ICD-10-CM

## 2011-11-10 DIAGNOSIS — E876 Hypokalemia: Secondary | ICD-10-CM

## 2011-11-10 LAB — CBC
HCT: 25.8 % — ABNORMAL LOW (ref 39.0–52.0)
MCH: 32.6 pg (ref 26.0–34.0)
MCHC: 34.5 g/dL (ref 30.0–36.0)
RDW: 17.1 % — ABNORMAL HIGH (ref 11.5–15.5)

## 2011-11-10 LAB — COMPREHENSIVE METABOLIC PANEL
ALT: 24 U/L (ref 0–53)
AST: 56 U/L — ABNORMAL HIGH (ref 0–37)
Alkaline Phosphatase: 259 U/L — ABNORMAL HIGH (ref 39–117)
CO2: 23 mEq/L (ref 19–32)
GFR calc Af Amer: >90 mL/min (ref >90)
GFR calc non Af Amer: >90 mL/min (ref >90)
Glucose, Bld: 111 mg/dL — ABNORMAL HIGH (ref 70–99)
Potassium: 2.9 mEq/L — ABNORMAL LOW (ref 3.5–5.1)
Sodium: 138 mEq/L (ref 135–145)
Total Protein: 5.4 g/dL — ABNORMAL LOW (ref 6.0–8.3)

## 2011-11-10 MED ORDER — HALOPERIDOL LACTATE 5 MG/ML IJ SOLN
1.0000 mg | Freq: Every day | INTRAMUSCULAR | Status: DC
  Administered 2011-11-10 – 2011-11-11 (×2): 1 mg via INTRAVENOUS
  Filled 2011-11-10: qty 0.2
  Filled 2011-11-10 (×2): qty 1
  Filled 2011-11-10: qty 0.2

## 2011-11-10 MED ORDER — POTASSIUM CHLORIDE 10 MEQ/100ML IV SOLN
INTRAVENOUS | Status: AC
  Filled 2011-11-10: qty 200

## 2011-11-10 MED ORDER — POTASSIUM CHLORIDE 10 MEQ/100ML IV SOLN
10.0000 meq | INTRAVENOUS | Status: AC
  Administered 2011-11-10 (×2): 10 meq via INTRAVENOUS

## 2011-11-10 MED ORDER — HYDRALAZINE HCL 20 MG/ML IJ SOLN
10.0000 mg | INTRAMUSCULAR | Status: DC | PRN
  Filled 2011-11-10: qty 0.5

## 2011-11-10 MED ORDER — POTASSIUM CHLORIDE 20 MEQ/15ML (10%) PO LIQD
40.0000 meq | Freq: Once | ORAL | Status: DC
  Filled 2011-11-10: qty 30

## 2011-11-10 MED ORDER — MAGNESIUM SULFATE 40 MG/ML IJ SOLN
2.0000 g | Freq: Once | INTRAMUSCULAR | Status: AC
  Administered 2011-11-10: 2 g via INTRAVENOUS
  Filled 2011-11-10: qty 50

## 2011-11-10 MED ORDER — POTASSIUM CHLORIDE 10 MEQ/100ML IV SOLN
10.0000 meq | INTRAVENOUS | Status: AC
  Administered 2011-11-10 (×4): 10 meq via INTRAVENOUS
  Filled 2011-11-10 (×3): qty 100

## 2011-11-10 MED ORDER — ONDANSETRON HCL 4 MG/2ML IJ SOLN: 4.0000 mg | INTRAMUSCULAR | Status: DC | PRN

## 2011-11-10 MED ORDER — POTASSIUM CHLORIDE 10 MEQ/100ML IV SOLN
INTRAVENOUS | Status: AC
  Filled 2011-11-10: qty 100

## 2011-11-10 NOTE — Progress Notes (Signed)
[facility] Progress Note [place] TEAM 1 - Stepdown/ICU TEAM   [name] JYN:[id number] DOB: 1945/11/19 DOA: 11/02/2011 PCP: Ruthe Mannan, MD  Brief narrative: 66 yo admitted 10/22 after fall and found to have displaced fracture of his left humerus. Has hx of ETOH with concern for DT's. Had ORIF proximal Lt humerus 10/25, and remained on vent post-op. PCCM consulted to assist with vent management in the setting of DT's.  Significant PMHx HTN, Hyperlipidemia, GERD, BPH, Anxiety/depression.  ETT 10/24>>10/25 10/27 precedex gtt  10/28 precedex weaned off   Care is being transitioned from PCCM back to [facility] today.  Assessment/Plan:  Post-operative respiratory failure  Successfully extubated 10/25  Chronic bronchitis PRN albuterol  Alcohol abuse with DT's off precedex since 10/28 - continue ativan as needed for agitation - Thiamine, folic acid, MVI - remains quite confused - CT of head was unrevealing - B12 confirmed to be normal/high - begin trial of QHS seroquel - move out of ICU asap  Hx of HTN currently normotensive but BP trending upward - follow  Hyponatremia Resolved - likely related to volume  Hypokalemia Recurring - replace and follow trend  Hypomagnesemia Replace further and f/u in am  Enterococcus UTI.  Completed Abx 10/26  Lt humerus fracture s/p ORIF Post-op care per surgery - lovenox for prophylaxis  Dysphagia.  Failed MBS 10/26 and 10/27 - could be a component of sedation/confusion - NPO - F/u speech eval when more alert - if MS does not improve over next 24hrs will need panda tube to begin TF (delay as long as possible due to risk for pt to d/c tube and risk for aspiration)  Hyperbilirubinemia with elevated ATS/ALT and rising alk phos Right upper quadrant [place] unrevealing - liver architecture unremarkable - alk phos trending down - follow - coags normal   Anemia F/u CBC - transfuse for Hb < 7   Code Status: DNR Disposition Plan: to SDU bed -  ultimate dispo is SNF placement   Consultants: Ortho  Procedures: OPEN REDUCTION INTERNAL FIXATION (ORIF) PROXIMAL HUMERUS FRACTURE (Left)  Antibiotics: 10/24 Ceftriaxone >> 10/26  DVT prophylaxis: lovenox  HPI/Subjective: Pt is alert but confused.  He can not provide a reliable hx.  No family is present at the time of my exam.    Objective: Blood pressure 143/88, pulse 98, temperature 99.2 F (37.3 C), temperature source Oral, resp. rate 20, height 5\' 9"  (1.753 m), weight 85 kg (187 lb 6.3 oz), SpO2 99.00%.  Intake/Output Summary (Last 24 hours) at 11/10/11 1025 Last data filed at 11/10/11 1000  Gross per 24 hour  Intake   1730 ml  Output    820 ml  Net    910 ml     Exam: General: No acute respiratory distress - alert but confused/agitatecd Lungs: Clear to auscultation bilaterally without wheezes or crackles Cardiovascular: Regular rate and rhythm without murmur gallop or rub normal S1 and S2 Abdomen: Nontender, nondistended, soft, bowel sounds positive, no rebound, no ascites, no appreciable mass - noted ventral and umbilical hernia w/o tenderness  Extremities: No significant cyanosis, clubbing, or edema bilateral lower extremities  Data Reviewed: Basic Metabolic Panel:  Lab 11/10/11 8657 11/09/11 0440 11/08/11 0940 11/08/11 0925 11/07/11 0804 11/06/11 0430 11/05/11 0241 11/04/11 0555  NA 138 138 -- 136 133* 131* -- --  K 2.9* 4.4 -- 3.8 3.1* 3.4* -- --  CL 105 103 -- 103 100 95* -- --  CO2 23 25 -- 27 26 21  -- --  GLUCOSE 111* 94 -- 133* 131* 58* -- --  BUN 6 7 -- 7 5* 6 -- --  CREATININE 0.58 0.62 -- 0.55 0.59 0.68 -- --  CALCIUM 7.9* 8.0* -- 7.8* 7.4* 7.1* -- --  MG -- 1.5 1.4* -- -- 1.5 1.6 1.1*  PHOS -- -- -- -- 2.4 -- -- --   Liver Function Tests:  Lab 11/10/11 0420 11/09/11 0440 11/07/11 0804  AST 56* 96* 58*  ALT 24 30 20   ALKPHOS 259* 294* 221*  BILITOT 4.2* 4.6* 4.8*  PROT 5.4* 5.8* 4.9*  ALBUMIN 2.1* 2.2* 1.9*   CBC:  Lab 11/10/11 0420  11/09/11 0440 11/08/11 0925 11/07/11 0804 11/06/11 0430  WBC 7.6 7.1 6.2 6.8 8.6  NEUTROABS -- -- -- -- --  HGB 8.9* 10.9* 9.5* 8.1* 8.1*  HCT 25.8* 31.7* 28.1* 23.5* 23.6*  MCV 94.5 96.9 95.9 93.6 93.3  PLT 253 286 198 178 156   CBG:  Lab 11/07/11 0732 11/06/11 1521 11/06/11 1127 11/06/11 0733 11/06/11 0634  GLUCAP 128* 107* 69* 86 63*    Recent Results (from the past 240 hour(s))  SURGICAL PCR SCREEN     Status: Normal   Collection Time   11/02/11  5:57 PM      Component Value Range Status Comment   MRSA, PCR NEGATIVE  NEGATIVE Final    Staphylococcus aureus NEGATIVE  NEGATIVE Final   URINE CULTURE     Status: Normal   Collection Time   11/03/11 11:00 PM      Component Value Range Status Comment   Specimen Description URINE, RANDOM   Final    Special Requests NONE   Final    Culture  Setup Time 11/03/2011 23:54   Final    Colony Count 20,OOO COLONIES/ML   Final    Culture ENTEROCOCCUS SPECIES   Final    Report Status 11/05/2011 FINAL   Final    Organism ID, Bacteria ENTEROCOCCUS SPECIES   Final   URINE CULTURE     Status: Normal   Collection Time   11/06/11  8:22 PM      Component Value Range Status Comment   Specimen Description URINE, CATHETERIZED   Final    Special Requests NONE   Final    Culture  Setup Time 11/07/2011 02:01   Final    Colony Count NO GROWTH   Final    Culture NO GROWTH   Final    Report Status 11/08/2011 FINAL   Final      Studies:  Recent x-ray studies have been reviewed in detail by the Attending Physician  Scheduled Meds:  Reviewed in detail by the Attending Physician   Lonia Blood, MD [facility] Office  [phone] Pager [phone]  On-Call/Text Page:      [email or web address]      password [email or web address]  If 7PM-7AM, please contact night-coverage [email or web address] Password [email or web address] 11/10/2011, 10:25 AM   LOS: 8 days

## 2011-11-10 NOTE — Progress Notes (Signed)
[facility] ADULT ICU REPLACEMENT PROTOCOL FOR AM LAB REPLACEMENT ONLY  The patient does not apply for the [facility] Adult ICU Electrolyte Replacment Protocol based on the criteria listed below:   1. Is urine output >/= 0.5 ml/kg/hr for the last 8 hours? no Patient's UOP is 0.28 ml/kg/hr 2. Abnormal electrolyte(s)  K-2.9   Llana Aliment 11/10/2011 5:22 AM

## 2011-11-10 NOTE — Progress Notes (Signed)
I have seen and examined the patient. I agree with the findings above.  Budd Palmer, MD 11/10/2011 10:45 PM

## 2011-11-10 NOTE — Progress Notes (Signed)
Report given to 3300 RN and patient transferred to 3301. RN received patient and patient placed on cardiac monitor. Patient in no distress with no needs at this time.   Wyn Quaker RN

## 2011-11-10 NOTE — Progress Notes (Signed)
Discussed pt with MD. Pts mental status has still not improved and dysphagia still severe. Will recheck pt in am tomorrow to determine if POs may be an option. If not, MD will consider alternate nutrition source. Thanks, Harlon Ditty, MA CCC-SLP [phone]

## 2011-11-10 NOTE — Progress Notes (Signed)
I have seen and examined the patient. I agree with the findings above.  Jeannelle Wiens H, MD  

## 2011-11-10 NOTE — Progress Notes (Signed)
Pt received from 2100.  Pt confused, crying, complains of pain.  Vital signs stable.  Pt resting quietly 10 minutes after admission.  Bed alarm on.  Will continue to monitor.    Maximino Greenland RN

## 2011-11-11 DIAGNOSIS — E876 Hypokalemia: Secondary | ICD-10-CM

## 2011-11-11 DIAGNOSIS — F1020 Alcohol dependence, uncomplicated: Secondary | ICD-10-CM

## 2011-11-11 LAB — BASIC METABOLIC PANEL
BUN: 7 mg/dL (ref 6–23)
CO2: 25 mEq/L (ref 19–32)
Chloride: 105 mEq/L (ref 96–112)
Creatinine, Ser: 0.59 mg/dL (ref 0.50–1.35)
Glucose, Bld: 98 mg/dL (ref 70–99)

## 2011-11-11 LAB — CBC
HCT: 27.3 % — ABNORMAL LOW (ref 39.0–52.0)
MCV: 95.8 fL (ref 78.0–100.0)
RDW: 16.9 % — ABNORMAL HIGH (ref 11.5–15.5)
WBC: 8.3 10*3/uL (ref 4.0–10.5)

## 2011-11-11 LAB — GLUCOSE, CAPILLARY

## 2011-11-11 MED ORDER — QUETIAPINE FUMARATE 200 MG PO TABS
200.0000 mg | ORAL_TABLET | Freq: Every day | ORAL | Status: DC
  Administered 2011-11-12 – 2011-11-15 (×4): 200 mg via ORAL
  Filled 2011-11-11 (×6): qty 1

## 2011-11-11 MED ORDER — QUETIAPINE FUMARATE ER 200 MG PO TB24
200.0000 mg | ORAL_TABLET | Freq: Every day | ORAL | Status: DC
  Filled 2011-11-11: qty 1

## 2011-11-11 MED ORDER — SODIUM CHLORIDE 0.9 % IJ SOLN
INTRAMUSCULAR | Status: AC
  Administered 2011-11-11: 10 mL
  Filled 2011-11-11: qty 10

## 2011-11-11 NOTE — Progress Notes (Signed)
Clinical Social Work-Covering SW continues to follow and will provide report to unit CSW upon return-pt has bed offers and CSW will facilitate d/c when medically stable- Investment banker, [employer]

## 2011-11-11 NOTE — Care Management Note (Signed)
    Page 1 of 2   11/17/2011     6:19:47 PM   CARE MANAGEMENT NOTE 11/17/2011  Patient:  [name], [name]   Account Number:  [id number]  Date Initiated:  11/05/2011  Documentation initiated by:  Avie Arenas  Subjective/Objective Assessment:   fall  - ETOH abuse - tx - post op remained on vent.  Has spouse.     Action/Plan:   PT/OT/ST evals   Anticipated DC Date:  11/16/2011   Anticipated DC Plan:  SKILLED NURSING FACILITY  In-house referral  Clinical Social Worker      DC Planning Services  CM consult      Choice offered to / List presented to:             Status of service:  Completed, signed off Medicare Important Message given?   (If response is "NO", the following Medicare IM given date fields will be blank) Date Medicare IM given:   Date Additional Medicare IM given:    Discharge Disposition:  SKILLED NURSING FACILITY  Per UR Regulation:  Reviewed for med. necessity/level of care/duration of stay  If discussed at Long Length of Stay Meetings, dates discussed:    Comments:  Contact:  Oetken,[name] Spouse   [phone]  11/12/11- 1145- Donn Pierini RN, BSN [phone] Pt had MBS and passed for oral intake with Dys. 1 (pureed with honey thick liquids) spoke with Boneta Lucks from [facility] and at this time pt would be more appropriate for SNF since pt will not need PEG at this time. MD aware, and CSW to follow up with family regarding SNF placement and d/c planning.  11-11-11 10:20am Avie Arenas, RNBSN [phone] ?? [facility] tx - may need PEG - swallowing studies.  Talked with Dr. Butler Denmark - is in favor of this - referral made. Talked with wife, Wife in agreement for [facility] referral - Would like to have [facility].  Would like to talk to physician first.  Confirmed cell phone number to call back and keep updated.  [facility] called - offered bed - just need more clarification on feeding - tubes. Physician updated.  Stil very confused - IV sedation needed.  Swallowing eval to be  done this afternoon.  11-05-11 12:50pm Avie Arenas, RNBSN [phone] Wife unable to care for patient at home - wanting SNF for Rehab - SW consulted.

## 2011-11-11 NOTE — Progress Notes (Signed)
PT Cancellation Note  Patient Details Name: MILLEDGE GERDING MRN: [id number] DOB: [birth year]   Cancelled Treatment:    Reason Eval/Treat Not Completed: Fatigue/lethargy limiting ability to participate;Medical issues which prohibited therapy  11/11/2011  Crandall Bing, PT [phone] (pager)    Griffen Frayne, Eliseo Gum 11/11/2011, 5:05 PM

## 2011-11-11 NOTE — Progress Notes (Signed)
Speech Language Pathology Dysphagia Treatment Patient Details Name: [name] MRN: [id number] DOB: Nov 23, 1945 Today's Date: 11/11/2011 Time: 1425-1440 SLP Time Calculation (min): 15 min  Assessment / Plan / Recommendation Clinical Impression  Pt showing improvement in function today though still severely confused. Pt was given ativan earlier today, but has now woken up sufficiently for assessment. Consumed trials of ice chips, 1/2 teaspoon of water and teaspoons of applesauce without any overt cough or wet vocal quality. Pt does continue to have multiple swallows indicative of residual and is unable to follow commands due to mental status. At this point pt will likely either need permanent alternate nutrition if he cannot consume PO (per MD) due to difficulty placing NG. Recommend f/u MBS in am to determine if pt may consume PO. Pt will need to be fully alert for exam. Please avoid sedating medications after 4 am tomorrow morning.     Diet Recommendation  Continue with Current Diet: NPO    SLP Plan MBS   Pertinent Vitals/Pain NA   Swallowing Goals  SLP Swallowing Goals Goal #3: Patient will maintain LOA and sustained attention to complete swallow strategies necessary to prevent aspiration with diagnostic trials of puree consistency administered by SLP only with moderate verbal cues.  Swallow Study Goal #3 - Progress: Not met  General Temperature Spikes Noted: No Respiratory Status: Supplemental O2 delivered via (comment) Behavior/Cognition: Confused;Lethargic;Distractible;Doesn't follow directions;Decreased sustained attention Oral Cavity - Dentition: Poor condition;Missing dentition Patient Positioning: Upright in bed  Oral Cavity - Oral Hygiene Does patient have any of the following "at risk" factors?: Nutritional status - inadequate Patient is HIGH RISK - Oral Care Protocol followed (see row info): Yes   Dysphagia Treatment Treatment focused on: Upgraded PO texture  trials Treatment Methods/Modalities: Effortful swallow;Skilled observation;Differential diagnosis Patient observed directly with PO's: Yes Type of PO's observed: Dysphagia 1 (puree);Thin liquids;Ice chips Feeding: Total assist Liquids provided via: Teaspoon Pharyngeal Phase Signs & Symptoms: Multiple swallows Type of cueing: Verbal;Tactile Amount of cueing: Maximal   GO    Harlon Ditty, MA CCC-SLP [phone]  Claudine Mouton 11/11/2011, 2:50 PM

## 2011-11-11 NOTE — Progress Notes (Signed)
Orthopaedic Trauma Service (OTS)  Subjective: 7 Days Post-Op Procedure(s) (LRB): OPEN REDUCTION INTERNAL FIXATION (ORIF) PROXIMAL HUMERUS FRACTURE (Left)   Pt in SDU Still confused  Communicating poorly Ortho issues stable  Objective: Current Vitals Blood pressure 144/95, pulse 99, temperature 98.3 F (36.8 C), temperature source Axillary, resp. rate 22, height 5\' 9"  (1.753 m), weight 90.901 kg (200 lb 6.4 oz), SpO2 98.00%. Vital signs in last 24 hours: Temp:  [97.4 F (36.3 C)-99.8 F (37.7 C)] 98.3 F (36.8 C) (10/31 0740) Pulse Rate:  [88-104] 99  (10/31 0935) Resp:  [17-24] 22  (10/31 0400) BP: (124-187)/(54-96) 144/95 mmHg (10/31 0600) SpO2:  [97 %-100 %] 98 % (10/31 0953) Weight:  [87.7 kg (193 lb 5.5 oz)-90.901 kg (200 lb 6.4 oz)] 90.901 kg (200 lb 6.4 oz) (10/31 0337)  Intake/Output from previous day: 10/30 0701 - 10/31 0700 In: 1900 [I.V.:1200; IV Piggyback:700] Out: 770 [Urine:770]  LABS  Basename 11/11/11 0425 11/10/11 0420 11/09/11 0440  HGB 9.1* 8.9* 10.9*    Basename 11/11/11 0425 11/10/11 0420  WBC 8.3 7.6  RBC 2.85* 2.73*  HCT 27.3* 25.8*  PLT 259 253    Basename 11/11/11 0425 11/10/11 0420  NA 138 138  K 3.4* 2.9*  CL 105 105  CO2 25 23  BUN 7 6  CREATININE 0.59 0.58  GLUCOSE 98 111*  CALCIUM 8.1* 7.9*   No results found for this basename: LABPT:2,INR:2 in the last 72 hours   Physical Exam  Gen: confused, resting in bed Ext: Left upper Extremity   Wound stable   Motor and sensory function appear to be grossly intact but pt not really participating with exam  Swelling stable  Ext warm  + radial pulse  Adjusted sling       Assessment/Plan: 7 Days Post-Op Procedure(s) (LRB): OPEN REDUCTION INTERNAL FIXATION (ORIF) PROXIMAL HUMERUS FRACTURE (Left)   66 y/o male s/p ground level fall due to EtOH abuse   1. Fall  2. Chronic EtOH abuse, DT's  3. L proximal humerus fx, POD 4   NWB L arm   Dressing changes PRN   PT/OT-  gentle shoulder PROM and AAROM L shoulder as pain allows (VERY GENTLE IR and ER), focus on pendulums, flexion and extension.  Active and passive ROM L elbow, forearm, wrist and hand  Ice prn   Sling for comfort 4. Continue per IM  5. Dvt/pe prophylaxis   lovenox- will not need to be on lovenox at discharge from ortho standpoint  6. dispo   Continue per IM   Mearl Latin, PA-C [facility] [phone] (P) 11/11/2011, 10:53 AM

## 2011-11-11 NOTE — Progress Notes (Signed)
Attempted to check pts ability to consume POs. RN reports she just gave pt ativan. Will return in pm or when RN call to report pt alert. Harlon Ditty, MA CCC-SLP [phone]

## 2011-11-11 NOTE — Progress Notes (Signed)
OT Cancellation Note  Patient Details Name: [name] MRN: [id number] DOB: 1945-04-12   Cancelled Treatment:    Reason Eval/Treat Not Completed: Medical issues which prohibited therapy: pt was agitate/restless and given Ativan. RN requesting therapy hold at this time. Will check back as schedule allows. Thank you. Glendale Chard, OTR/L Pager: [phone] 11/11/2011    Krissia Schreier 11/11/2011, 4:42 PM

## 2011-11-11 NOTE — Progress Notes (Addendum)
[facility] Progress Note [place] TEAM 1 - Stepdown/ICU TEAM   [name] ZHY:[id number] DOB: 1945-08-02 DOA: 11/02/2011 PCP: Ruthe Mannan, MD  Brief narrative: 66 yo admitted 10/22 after fall and found to have displaced fracture of his left humerus. Has hx of ETOH with concern for DT's. Had ORIF proximal Lt humerus 10/25, and remained on vent post-op. PCCM consulted to assist with vent management in the setting of DT's.  Significant PMHx HTN, Hyperlipidemia, GERD, BPH, Anxiety/depression.  ETT 10/24>>10/25 10/27 precedex gtt  10/28 precedex weaned off   Care is being transitioned from PCCM back to [facility] today.  Assessment/Plan:  Post-operative respiratory failure  Successfully extubated 10/25  Chronic bronchitis PRN albuterol  Alcohol abuse with DT's off precedex since 10/28 - continuing to require a good amount of Ativan - Thiamine, folic acid, MVI - remains quite confused - CT of head was unrevealing - B12 confirmed to be normal/high - begin trial of QHS seroquel 200 mg -   Hx of HTN currently normotensive but BP trending upward - follow  Hyponatremia Resolved - likely related to volume  Hypokalemia Recurring - replace and follow trend  Hypomagnesemia Replace further and f/u in am  Enterococcus UTI.  Completed Abx 10/26  Lt humerus fracture s/p ORIF Post-op care per surgery - lovenox for prophylaxis  Dysphagia.  Failed MBS 10/26 and 10/27 - could be a component of sedation/confusion - NPO -- no sedating meds after 4 AM tomorrow to reassess swallowing.   Hyperbilirubinemia with elevated ATS/ALT and rising alk phos Right upper quadrant [place] unrevealing - liver architecture unremarkable - alk phos trending down - follow - coags normal   Anemia F/u CBC - transfuse for Hb < 7   Code Status: DNR Disposition Plan: to SDU bed - ultimate dispo is SNF placement   Consultants: Ortho  Procedures: OPEN REDUCTION INTERNAL FIXATION (ORIF) PROXIMAL HUMERUS  FRACTURE (Left)  Antibiotics: 10/24 Ceftriaxone >> 10/26  DVT prophylaxis: lovenox  HPI/Subjective: Pt alert but remained confused.    Objective: Blood pressure 144/95, pulse 91, temperature 98.1 F (36.7 C), temperature source Oral, resp. rate 22, height 5\' 9"  (1.753 m), weight 90.901 kg (200 lb 6.4 oz), SpO2 98.00%.  Intake/Output Summary (Last 24 hours) at 11/11/11 1805 Last data filed at 11/11/11 1653  Gross per 24 hour  Intake    910 ml  Output   1125 ml  Net   -215 ml     Exam: General: No acute respiratory distress - alert but confused and agitated/  Lungs: Clear to auscultation bilaterally without wheezes or crackles Cardiovascular: Regular rate and rhythm without murmur gallop or rub normal S1 and S2 Abdomen: Nontender, nondistended, soft, bowel sounds positive, no rebound, no ascites, no appreciable mass - noted ventral and umbilical hernia w/o tenderness  Extremities: No significant cyanosis, clubbing, or edema bilateral lower extremities  Data Reviewed: Basic Metabolic Panel:  Lab 11/11/11 2952 11/10/11 0420 11/09/11 0440 11/08/11 0940 11/08/11 0925 11/07/11 0804 11/06/11 0430 11/05/11 0241  NA 138 138 138 -- 136 133* -- --  K 3.4* 2.9* 4.4 -- 3.8 3.1* -- --  CL 105 105 103 -- 103 100 -- --  CO2 25 23 25  -- 27 26 -- --  GLUCOSE 98 111* 94 -- 133* 131* -- --  BUN 7 6 7  -- 7 5* -- --  CREATININE 0.59 0.58 0.62 -- 0.55 0.59 -- --  CALCIUM 8.1* 7.9* 8.0* -- 7.8* 7.4* -- --  MG 1.5 -- 1.5 1.4* -- --  1.5 1.6  PHOS -- -- -- -- -- 2.4 -- --   Liver Function Tests:  Lab 11/10/11 0420 11/09/11 0440 11/07/11 0804  AST 56* 96* 58*  ALT 24 30 20   ALKPHOS 259* 294* 221*  BILITOT 4.2* 4.6* 4.8*  PROT 5.4* 5.8* 4.9*  ALBUMIN 2.1* 2.2* 1.9*   CBC:  Lab 11/11/11 0425 11/10/11 0420 11/09/11 0440 11/08/11 0925 11/07/11 0804  WBC 8.3 7.6 7.1 6.2 6.8  NEUTROABS -- -- -- -- --  HGB 9.1* 8.9* 10.9* 9.5* 8.1*  HCT 27.3* 25.8* 31.7* 28.1* 23.5*  MCV 95.8 94.5 96.9  95.9 93.6  PLT 259 253 286 198 178   CBG:  Lab 11/11/11 0941 11/07/11 0732 11/06/11 1521 11/06/11 1127 11/06/11 0733  GLUCAP 69* 128* 107* 69* 86    Recent Results (from the past 240 hour(s))  SURGICAL PCR SCREEN     Status: Normal   Collection Time   11/02/11  5:57 PM      Component Value Range Status Comment   MRSA, PCR NEGATIVE  NEGATIVE Final    Staphylococcus aureus NEGATIVE  NEGATIVE Final   URINE CULTURE     Status: Normal   Collection Time   11/03/11 11:00 PM      Component Value Range Status Comment   Specimen Description URINE, RANDOM   Final    Special Requests NONE   Final    Culture  Setup Time 11/03/2011 23:54   Final    Colony Count 20,OOO COLONIES/ML   Final    Culture ENTEROCOCCUS SPECIES   Final    Report Status 11/05/2011 FINAL   Final    Organism ID, Bacteria ENTEROCOCCUS SPECIES   Final   URINE CULTURE     Status: Normal   Collection Time   11/06/11  8:22 PM      Component Value Range Status Comment   Specimen Description URINE, CATHETERIZED   Final    Special Requests NONE   Final    Culture  Setup Time 11/07/2011 02:01   Final    Colony Count NO GROWTH   Final    Culture NO GROWTH   Final    Report Status 11/08/2011 FINAL   Final      Studies:  Recent x-ray studies have been reviewed in detail by the Attending Physician  Scheduled Meds:  Reviewed in detail by the Attending Physician   Calvert Cantor, MD [phone]  If 7PM-7AM, please contact night-coverage [email or web address] Password [email or web address] 11/11/2011, 6:05 PM   LOS: 9 days

## 2011-11-11 NOTE — Progress Notes (Signed)
Patient awake, agigated, experincing some shortnes of breath as well  Exp. Wheezing. Pulse Ox is 100%, respirations are 25. CIWA =29. Notified Respiratory for a treatment, and received Ativan 2mg  IV.

## 2011-11-12 ENCOUNTER — Inpatient Hospital Stay (HOSPITAL_COMMUNITY): Payer: Medicare Other

## 2011-11-12 DIAGNOSIS — Z452 Encounter for adjustment and management of vascular access device: Secondary | ICD-10-CM | POA: Diagnosis not present

## 2011-11-12 LAB — URINALYSIS, ROUTINE W REFLEX MICROSCOPIC
Nitrite: NEGATIVE
Specific Gravity, Urine: 1.01 (ref 1.005–1.030)
Urobilinogen, UA: 4 mg/dL — ABNORMAL HIGH (ref 0.0–1.0)

## 2011-11-12 LAB — URINE MICROSCOPIC-ADD ON

## 2011-11-12 MED ORDER — PANTOPRAZOLE SODIUM 40 MG PO TBEC
40.0000 mg | DELAYED_RELEASE_TABLET | Freq: Every day | ORAL | Status: DC
  Administered 2011-11-13 – 2011-11-16 (×4): 40 mg via ORAL
  Filled 2011-11-12 (×3): qty 1

## 2011-11-12 MED ORDER — LORAZEPAM 2 MG/ML IJ SOLN
2.0000 mg | Freq: Once | INTRAMUSCULAR | Status: AC
  Administered 2011-11-12: 2 mg via INTRAMUSCULAR
  Filled 2011-11-12: qty 1

## 2011-11-12 MED ORDER — SODIUM CHLORIDE 0.9 % IJ SOLN
10.0000 mL | INTRAMUSCULAR | Status: DC | PRN
  Administered 2011-11-14 – 2011-11-15 (×5): 10 mL

## 2011-11-12 MED ORDER — FOLIC ACID 1 MG PO TABS
1.0000 mg | ORAL_TABLET | Freq: Every day | ORAL | Status: DC
  Administered 2011-11-13 – 2011-11-16 (×4): 1 mg via ORAL
  Filled 2011-11-12 (×4): qty 1

## 2011-11-12 MED ORDER — LORAZEPAM 2 MG/ML IJ SOLN
1.0000 mg | Freq: Four times a day (QID) | INTRAMUSCULAR | Status: DC | PRN
  Administered 2011-11-12: 1 mg via INTRAVENOUS
  Filled 2011-11-12: qty 1

## 2011-11-12 MED ORDER — POTASSIUM CHLORIDE 10 MEQ/100ML IV SOLN
10.0000 meq | INTRAVENOUS | Status: DC
  Administered 2011-11-12 (×2): 10 meq via INTRAVENOUS
  Filled 2011-11-12: qty 400

## 2011-11-12 MED ORDER — FENTANYL CITRATE 0.05 MG/ML IJ SOLN: 25.0000 ug | INTRAMUSCULAR | Status: DC | PRN

## 2011-11-12 MED ORDER — SPIRONOLACTONE 25 MG PO TABS
25.0000 mg | ORAL_TABLET | Freq: Every day | ORAL | Status: DC
  Administered 2011-11-12 – 2011-11-16 (×5): 25 mg via ORAL
  Filled 2011-11-12 (×5): qty 1

## 2011-11-12 MED ORDER — POTASSIUM CHLORIDE CRYS ER 20 MEQ PO TBCR
20.0000 meq | EXTENDED_RELEASE_TABLET | Freq: Two times a day (BID) | ORAL | Status: DC
  Administered 2011-11-12: 20 meq via ORAL
  Filled 2011-11-12 (×3): qty 1

## 2011-11-12 MED ORDER — ENSURE PUDDING PO PUDG
1.0000 | Freq: Three times a day (TID) | ORAL | Status: DC
  Administered 2011-11-12 – 2011-11-16 (×4): 1 via ORAL

## 2011-11-12 MED ORDER — VITAMIN B-1 100 MG PO TABS
100.0000 mg | ORAL_TABLET | Freq: Every day | ORAL | Status: DC
  Administered 2011-11-13 – 2011-11-16 (×4): 100 mg via ORAL
  Filled 2011-11-12 (×4): qty 1

## 2011-11-12 NOTE — Progress Notes (Signed)
Nutrition Follow-up  Intervention:    Received verbal order from Dr. Sharon Seller to advance diet to Dysphagia 1 with honey-thick liquids per SLP recommendation.  Will also order Ensure Pudding TID between meals. Recommend monitor magnesium, potassium, and phosphorus daily for at least 3 days, MD to replete as needed, as pt is at risk for refeeding syndrome given recent prolonged NPO status.  Assessment:   Patient S/P MBS today. SLP recommends Dysphagia 1 diet with honey-thick liquids.  Patient is confused.  Patient is at nutrition risk given suboptimal oral intake since admission with no oral intake since 10/24.  Patient is at risk for poor oral intake given dysphagia, confusion, and recent prolonged NPO status.    Diet Order:  NPO  Meds: Scheduled Meds:    . antiseptic oral rinse  15 mL Mouth Rinse q12n4p  . chlorhexidine  15 mL Mouth Rinse BID  . enoxaparin (LOVENOX) injection  40 mg Subcutaneous Q24H  . folic acid  1 mg Intravenous Daily  . haloperidol lactate  1 mg Intravenous QHS  . pantoprazole (PROTONIX) IV  40 mg Intravenous Q1200  . QUEtiapine  200 mg Oral QHS  . thiamine  100 mg Intravenous Daily  . DISCONTD: QUEtiapine  200 mg Oral QHS   Continuous Infusions:    . dextrose 5 % and 0.9 % NaCl with KCl 20 mEq/L 75 mL/hr at 11/12/11 0840   PRN Meds:.albuterol, fentaNYL, hydrALAZINE, LORazepam, ondansetron (ZOFRAN) IV   CMP     Component Value Date/Time   NA 138 11/11/2011 0425   K 3.4* 11/11/2011 0425   CL 105 11/11/2011 0425   CO2 25 11/11/2011 0425   GLUCOSE 98 11/11/2011 0425   BUN 7 11/11/2011 0425   CREATININE 0.59 11/11/2011 0425   CALCIUM 8.1* 11/11/2011 0425   PROT 5.4* 11/10/2011 0420   ALBUMIN 2.1* 11/10/2011 0420   AST 56* 11/10/2011 0420   ALT 24 11/10/2011 0420   ALKPHOS 259* 11/10/2011 0420   BILITOT 4.2* 11/10/2011 0420   GFRNONAA >90 11/11/2011 0425   GFRAA >90 11/11/2011 0425    Sodium  Date/Time Value Range Status  11/11/2011  4:25 AM 138   135 - 145 mEq/L Final  11/10/2011  4:20 AM 138  135 - 145 mEq/L Final  11/09/2011  4:40 AM 138  135 - 145 mEq/L Final    Potassium  Date/Time Value Range Status  11/11/2011  4:25 AM 3.4* 3.5 - 5.1 mEq/L Final  11/10/2011  4:20 AM 2.9* 3.5 - 5.1 mEq/L Final     DELTA CHECK NOTED     NO VISIBLE HEMOLYSIS  11/09/2011  4:40 AM 4.4  3.5 - 5.1 mEq/L Final     HEMOLYSIS AT THIS LEVEL MAY AFFECT RESULT    Phosphorus  Date/Time Value Range Status  11/07/2011  8:04 AM 2.4  2.3 - 4.6 mg/dL Final  05/16/2128  8:65 PM 3.1  2.3 - 4.6 mg/dL Final  78/46/9629  5:28 AM 2.8  2.3 - 4.6 mg/dL Final    Magnesium  Date/Time Value Range Status  11/11/2011  4:25 AM 1.5  1.5 - 2.5 mg/dL Final  41/32/4401  0:27 AM 1.5  1.5 - 2.5 mg/dL Final  25/36/6440  3:47 AM 1.4* 1.5 - 2.5 mg/dL Final    CBG (last 3)   Basename 11/11/11 0941  GLUCAP 69*     Intake/Output Summary (Last 24 hours) at 11/12/11 1444 Last data filed at 11/12/11 1140  Gross per 24 hour  Intake   1950  ml  Output   1525 ml  Net    425 ml    Weight Status:  88.5 kg continues to trend up   Estimated needs:  2040-2290 kcals, 90-105 gm protein, >2 liters fluid daily  Nutrition Dx:  Inadequate oral intake related to difficulty swallowing as evidenced by NPO diet, ongoing.  Goal:  Intake to meet >90% of estimated nutrition needs, unmet.  Monitor:  Diet tolerance, PO intake, weight trend, labs.   Joaquin Courts, RD, LDN, CNSC Pager# [phone] After Hours Pager# [phone]

## 2011-11-12 NOTE — Progress Notes (Signed)
Utilization review completed.  

## 2011-11-12 NOTE — Progress Notes (Signed)
Pt able coherently tell me his name and his wife's name, but not oriented to place or situation.  Answers yes and no questions.  Still confused and agitated. Will continue to monitor.   Maximino Greenland RN

## 2011-11-12 NOTE — Procedures (Signed)
Objective Swallowing Evaluation: Modified Barium Swallowing Study  Patient Details  Name: [name] MRN: [id number] Date of Birth: 06-17-1945  Today's Date: 11/12/2011 Time: 0850-0920 SLP Time Calculation (min): 30 min  Past Medical History:  Past Medical History  Diagnosis Date  . HLD (hyperlipidemia) 5/99  . HTN (hypertension) 5/99  . BPH (benign prostatic hypertrophy) 5/99  . GERD (gastroesophageal reflux disease) 07/31/04    gastritis   . Anxiety   . Allergy     pollen/ragweed  . Adenomatous polyp of colon 2006  . Hearing difficulty   . Depression   . Humerus fracture 11/02/2011    LUE  . Peripheral vascular disease   . Pneumonia 1996    hosp  . Chronic bronchitis     "q year or q other year" (11/02/2011)  . History of blood transfusion 2012  . Anemia 2012  . Hernia, umbilical     "unrepaired" (11/02/2011)  . H/O hiatal hernia   . Memory loss     "recently has been forgetting alot; maybe related to the alcohol" (11/02/2011)  . Falls frequently     "daily lately; legs just give out" (11/02/2011)   Past Surgical History:  Past Surgical History  Procedure Date  . L duputryen contractive surgery   . Neck mri 6/04    C5-6 disc abnormality  . Colonoscopy 07/31/04    multiple polyps; repeat in 3 years  . Hosp cp r/o'd 2/24 03/08/07  . Ett myoview 03/09/07    nml EF 66%  . Upper gastrointestinal endoscopy   . Cataract extraction w/ intraocular lens  implant, bilateral 10/2002  . Tonsillectomy     "I was young" (11/02/2011)  . Hemorrhoid surgery     "cauterized a long time ago" (11/02/2011)  . Esophageal varice ligation 2012  . Orif humerus fracture 11/04/2011    Procedure: OPEN REDUCTION INTERNAL FIXATION (ORIF) PROXIMAL HUMERUS FRACTURE;  Surgeon: Budd Palmer, MD;  Location: [facility] OR;  Service: Orthopedics;  Laterality: Left;   HPI:  66 y/o male admitted s/p fall at home and found to have displaced fracture of his left humerus. Has history of ETOH with  concerns for DT's.  S/p ORIF proximal Left humerous 10/25 and remained on vent post-op.  Extubated on 10/25.  Initial MBS on 10/26 showed aspriation of all consistencies. Pt has showed limited progress at bedside with alertness. At this point PEG vs. PO. MBS to determine if progress has been made despite persistent confusion      Assessment / Plan / Recommendation Clinical Impression  Dysphagia Diagnosis: Moderate oral phase dysphagia Clinical impression: Pt presents with a moderate oral dysphagia with piecemeal transit of bolus to pharynx. Pt independently completes second swallow for propulsion of oral residuals to pharynx post swallow. Pharyngeal dysphagia much imporved since last study. A delay in initation still present with nectar thick liquids falling to the pyriforms and to the cords prior to swallow initiation (penetrates fully ejected during swallow). With teaspoon bites of honey thick liquids the pt is able to fully transit the bolus with two swallow attempts with no significant resiuals and no penetration or aspiration. Dysphagia likely only secondary to mentation at this point. Pt often receing ativan for CIWA protoco  and is often too sedated for PO. Recommend offering puree (dys 1) and honey thick liquids when pt alert. May given peds whole in puree also only when fully alert (still confused). SLP will f/u for upgrades as pts mentation continues to improve.  Treatment Recommendation  Therapy as outlined in treatment plan below    Diet Recommendation Dysphagia 1 (Puree);Honey-thick liquid   Liquid Administration via: Spoon Medication Administration: Whole meds with puree Supervision: Full supervision/cueing for compensatory strategies Compensations: Slow rate;Small sips/bites;Check for pocketing;Multiple dry swallows after each bite/sip;Follow solids with liquid Postural Changes and/or Swallow Maneuvers: Seated upright 90 degrees;Upright 30-60 min after meal    Other   Recommendations Oral Care Recommendations: Oral care BID   Follow Up Recommendations  Inpatient Rehab    Frequency and Duration min 2x/week  2 weeks   Pertinent Vitals/Pain NA    SLP Swallow Goals Patient will consume recommended diet without observed clinical signs of aspiration with: Maximum assistance Patient will utilize recommended strategies during swallow to increase swallowing safety with: Maximal cueing   General HPI: 66 y/o male admitted s/p fall at home and found to have displaced fracture of his left humerus. Has history of ETOH with concerns for DT's.  S/p ORIF proximal Left humerous 10/25 and remained on vent post-op.  Extubated on 10/25.  Initial MBS on 10/26 showed aspriation of all consistencies. Pt has showed limited progress at bedside with alertness. At this point PEG vs. PO. MBS to determine if progress has been made despite persistent confusion  Type of Study: Modified Barium Swallowing Study Reason for Referral: Objectively evaluate swallowing function Previous Swallow Assessment: see HPI Temperature Spikes Noted: No Respiratory Status: Room air History of Recent Intubation: Yes Length of Intubations (days): 2 days Date extubated: 11/05/11 Behavior/Cognition: Confused;Distractible;Requires cueing;Alert Oral Cavity - Dentition: Poor condition;Missing dentition Oral Motor / Sensory Function: Impaired - see Bedside swallow eval Self-Feeding Abilities: Total assist Patient Positioning: Upright in chair Baseline Vocal Quality: Clear Volitional Cough: Cognitively unable to elicit Volitional Swallow: Unable to elicit Anatomy: Within functional limits Pharyngeal Secretions: Not observed secondary MBS    Reason for Referral Objectively evaluate swallowing function   Oral Phase Oral Preparation/Oral Phase Oral Phase: Impaired Oral - Honey Oral - Honey Teaspoon: Piecemeal swallowing;Lingual/palatal residue;Delayed oral transit Oral - Honey Cup: Not tested Oral -  Nectar Oral - Nectar Cup: Not tested Oral - Thin Oral - Thin Teaspoon: Piecemeal swallowing;Lingual/palatal residue;Delayed oral transit Oral - Thin Cup: Not tested Oral - Solids Oral - Puree: Piecemeal swallowing;Lingual/palatal residue;Delayed oral transit Oral - Pill: Piecemeal swallowing;Lingual/palatal residue;Delayed oral transit   Pharyngeal Phase Pharyngeal - Nectar Pharyngeal - Nectar Teaspoon: Premature spillage to pyriform sinuses;Penetration/Aspiration before swallow;Pharyngeal residue - valleculae;Delayed swallow initiation Penetration/Aspiration details (nectar teaspoon): Material enters airway, CONTACTS cords then ejected out Pharyngeal - Nectar Cup: Not tested Pharyngeal - Thin Pharyngeal - Thin Teaspoon: Pharyngeal residue - valleculae;Pharyngeal residue - pyriform sinuses;Premature spillage to pyriform sinuses;Delayed swallow initiation Penetration/Aspiration details (thin teaspoon): Material does not enter airway Pharyngeal - Thin Cup: Not tested Pharyngeal - Solids Pharyngeal - Puree: Delayed swallow initiation;Premature spillage to valleculae Penetration/Aspiration details (puree): Material does not enter airway  Cervical Esophageal Phase    GO    Cervical Esophageal Phase Cervical Esophageal Phase: WFL (Did not complete esophageal sweep, pain in ULE)        Harlon Ditty, MA CCC-SLP [phone]  Danylle Ouk, Riley Nearing 11/12/2011, 9:48 AM

## 2011-11-12 NOTE — Progress Notes (Signed)
Physical Therapy Treatment Patient Details Name: [name] MRN: [id number] DOB: 1945/01/25 Today's Date: 11/12/2011 Time: 0865-7846 PT Time Calculation (min): 33 min  PT Assessment / Plan / Recommendation Comments on Treatment Session  pt was difficult to mobilize due to unable to maintain therapeutic focus.      Follow Up Recommendations  Post acute inpatient     Does the patient have the potential to tolerate intense rehabilitation  No, Recommend SNF  Barriers to Discharge        Equipment Recommendations  None recommended by PT    Recommendations for Other Services    Frequency Min 3X/week   Plan Discharge plan remains appropriate;Frequency needs to be updated    Precautions / Restrictions Precautions Precautions: Fall Required Braces or Orthoses: Other Brace/Splint Other Brace/Splint: L UE sling Restrictions Weight Bearing Restrictions: Yes LUE Weight Bearing: Non weight bearing   Pertinent Vitals/Pain     Mobility  Bed Mobility Bed Mobility: Supine to Sit;Sitting - Scoot to Edge of Bed Supine to Sit: 1: +2 Total assist;HOB elevated Supine to Sit: Patient Percentage: 40% Sitting - Scoot to Edge of Bed: 3: Mod assist Details for Bed Mobility Assistance: pt following vc/tc's for dircection; truncal assist Transfers Sit to Stand: 1: +2 Total assist;With upper extremity assist;From bed Sit to Stand: Patient Percentage: 30% Stand to Sit: 1: +2 Total assist;With upper extremity assist;To chair/3-in-1;With armrests;To bed Stand to Sit: Patient Percentage: 30% Stand Pivot Transfers: 1: +2 Total assist Stand Pivot Transfers: Patient Percentage: 30% Details for Transfer Assistance: vc's for UE placement and assist; significant truncal assist Ambulation/Gait Ambulation/Gait Assistance: Not tested (comment) Stairs: No Wheelchair Mobility Wheelchair Mobility: No    Exercises     PT Diagnosis:    PT Problem List:   PT Treatment Interventions:     PT  Goals Acute Rehab PT Goals Time For Goal Achievement: 11/17/11 Potential to Achieve Goals: Good PT Goal: Supine/Side to Sit - Progress: Not met PT Goal: Sit to Stand - Progress: Not met PT Goal: Stand to Sit - Progress: Not met  Visit Information  Last PT Received On: 11/12/11 Assistance Needed: +2    Subjective Data  Subjective: oh, it hurts it hurts   Cognition  Overall Cognitive Status: Impaired Area of Impairment: Attention;Memory;Following commands;Safety/judgement;Awareness of errors;Awareness of deficits;Problem solving Arousal/Alertness: Awake/alert Behavior During Session: Restless Current Attention Level: Focused Following Commands: Follows one step commands inconsistently Safety/Judgement: Decreased awareness of safety precautions;Decreased awareness of need for assistance;Decreased safety judgement for tasks assessed    Balance  Balance Balance Assessed: Yes Static Sitting Balance Static Sitting - Balance Support: Right upper extremity supported;Feet supported Static Sitting - Level of Assistance: 2: Max assist Static Sitting - Comment/# of Minutes: 4 min prepping for stands and pericare Static Standing Balance Static Standing - Balance Support: During functional activity Static Standing - Level of Assistance: 2: Max assist  End of Session PT - End of Session Activity Tolerance: Other (comment) (pt unable to focus or attend to therapy) Patient left: in chair;with call bell/phone within reach Nurse Communication: Mobility status;Need for lift equipment   GP     Nhyira Leano, Eliseo Gum 11/12/2011, 2:23 PM  11/12/2011  [place] Bing, PT [phone] (pager)

## 2011-11-12 NOTE — Progress Notes (Signed)
[facility] Progress Note [place] TEAM 1 - Stepdown/ICU TEAM   [name] XBJ:[id number] DOB: Jun 02, 1945 DOA: 11/02/2011 PCP: Ruthe Mannan, MD  Brief narrative: 66 yo admitted 10/22 after fall and found to have displaced fracture of his left humerus. Has hx of ETOH with concern for DT's. Had ORIF proximal Lt humerus 10/25, and remained on vent post-op. PCCM consulted to assist with vent management in the setting of DT's.  Significant PMHx HTN, Hyperlipidemia, GERD, BPH, Anxiety/depression.  ETT 10/24>>10/25 10/27 precedex gtt  10/28 precedex weaned off   Assessment/Plan:  Post-operative respiratory failure  Successfully extubated 10/25  Chronic bronchitis PRN albuterol  Alcohol abuse with DT's off precedex since 10/28 - thiamine, folic acid, MVI - remains quite confused, though much more alert - CT of head was unrevealing - B12 confirmed to be normal/high - continue QHS seroquel  Hx of HTN currently normotensive but BP trending upward - follow  Hyponatremia Resolved - likely related to volume  Hypokalemia Recurring - cont to replace and follow trend  Hypomagnesemia Recheck in AM - would like to get level to 2.0  Enterococcus UTI.  Completed Abx 10/26 - recheck UA given ongoing MS issues   Lt humerus fracture s/p ORIF Post-op care per surgery - lovenox for prophylaxis  Dysphagia Failed MBS 10/26 and 10/27 - was cleared for modified diet today w/ SLP re-eval when more clear from MS standpoint - initiate prescribed diet and follow  Hyperbilirubinemia with elevated ATS/ALT and rising alk phos Right upper quadrant [place] unrevealing - liver architecture unremarkable - alk phos trending down - coags normal   Anemia F/u CBC - transfuse for Hb < 7   Code Status: DNR Disposition Plan: SDU bed - ultimate dispo is SNF placement   Consultants: Ortho  Procedures: OPEN REDUCTION INTERNAL FIXATION (ORIF) PROXIMAL HUMERUS FRACTURE (Left)  Antibiotics: 10/24  Ceftriaxone >> 10/26  DVT prophylaxis: lovenox  HPI/Subjective: Pt is much more alert today, but remains quite confused.  He will not open his eyes.  He denies f/c, sob, n/v, or abdom pain.  There is no family present in the room.     Objective: Blood pressure 142/91, pulse 112, temperature 100.1 F (37.8 C), temperature source Axillary, resp. rate 21, height 5\' 9"  (1.753 m), weight 88.5 kg (195 lb 1.7 oz), SpO2 99.00%.  Intake/Output Summary (Last 24 hours) at 11/12/11 1447 Last data filed at 11/12/11 1140  Gross per 24 hour  Intake   1950 ml  Output   1525 ml  Net    425 ml     Exam: General: No acute respiratory distress - alert but confused and agitated Lungs: Clear to auscultation bilaterally without wheezes or crackles Cardiovascular: Regular rate and rhythm without murmur gallop or rub normal S1 and S2 Abdomen: Nontender, nondistended, soft, bowel sounds positive, no rebound, no ascites, no appreciable mass - noted ventral and umbilical hernia w/o tenderness  Extremities: No significant cyanosis, clubbing, or edema bilateral lower extremities  Data Reviewed: Basic Metabolic Panel:  Lab 11/11/11 3086 11/10/11 0420 11/09/11 0440 11/08/11 0940 11/08/11 0925 11/07/11 0804 11/06/11 0430  NA 138 138 138 -- 136 133* --  K 3.4* 2.9* 4.4 -- 3.8 3.1* --  CL 105 105 103 -- 103 100 --  CO2 25 23 25  -- 27 26 --  GLUCOSE 98 111* 94 -- 133* 131* --  BUN 7 6 7  -- 7 5* --  CREATININE 0.59 0.58 0.62 -- 0.55 0.59 --  CALCIUM 8.1* 7.9* 8.0* --  7.8* 7.4* --  MG 1.5 -- 1.5 1.4* -- -- 1.5  PHOS -- -- -- -- -- 2.4 --   Liver Function Tests:  Lab 11/10/11 0420 11/09/11 0440 11/07/11 0804  AST 56* 96* 58*  ALT 24 30 20   ALKPHOS 259* 294* 221*  BILITOT 4.2* 4.6* 4.8*  PROT 5.4* 5.8* 4.9*  ALBUMIN 2.1* 2.2* 1.9*   CBC:  Lab 11/11/11 0425 11/10/11 0420 11/09/11 0440 11/08/11 0925 11/07/11 0804  WBC 8.3 7.6 7.1 6.2 6.8  NEUTROABS -- -- -- -- --  HGB 9.1* 8.9* 10.9* 9.5* 8.1*  HCT  27.3* 25.8* 31.7* 28.1* 23.5*  MCV 95.8 94.5 96.9 95.9 93.6  PLT 259 253 286 198 178   CBG:  Lab 11/11/11 0941 11/07/11 0732 11/06/11 1521 11/06/11 1127 11/06/11 0733  GLUCAP 69* 128* 107* 69* 86    Recent Results (from the past 240 hour(s))  SURGICAL PCR SCREEN     Status: Normal   Collection Time   11/02/11  5:57 PM      Component Value Range Status Comment   MRSA, PCR NEGATIVE  NEGATIVE Final    Staphylococcus aureus NEGATIVE  NEGATIVE Final   URINE CULTURE     Status: Normal   Collection Time   11/03/11 11:00 PM      Component Value Range Status Comment   Specimen Description URINE, RANDOM   Final    Special Requests NONE   Final    Culture  Setup Time 11/03/2011 23:54   Final    Colony Count 20,OOO COLONIES/ML   Final    Culture ENTEROCOCCUS SPECIES   Final    Report Status 11/05/2011 FINAL   Final    Organism ID, Bacteria ENTEROCOCCUS SPECIES   Final   URINE CULTURE     Status: Normal   Collection Time   11/06/11  8:22 PM      Component Value Range Status Comment   Specimen Description URINE, CATHETERIZED   Final    Special Requests NONE   Final    Culture  Setup Time 11/07/2011 02:01   Final    Colony Count NO GROWTH   Final    Culture NO GROWTH   Final    Report Status 11/08/2011 FINAL   Final      Studies:  Recent x-ray studies have been reviewed in detail by the Attending Physician  Scheduled Meds:  Reviewed in detail by the Attending Physician   Lonia Blood, MD [facility] Office  [phone] Pager [phone]  On-Call/Text Page:      [email or web address]      password [facility]  11/12/2011, 2:47 PM   LOS: 10 days

## 2011-11-12 NOTE — Progress Notes (Signed)
Clinical Child psychotherapist (CSW) contacted pt wife to provide bed offers. Pt wife has chosen placement at  [facility] if pt not appropriate for [facility]. CSW to facilitate with dc when pt is medically stable.  Theresia Bough, MSW, Theresia Majors [phone]

## 2011-11-12 NOTE — Evaluation (Signed)
Occupational Therapy Evaluation Patient Details Name: [name] MRN: [id number] DOB: [birth year] Today's Date: 11/12/2011 Time: 4098-1191 OT Time Calculation (min): 30 min  OT Assessment / Plan / Recommendation Clinical Impression  66 y/o male admitted s/p fall at home and found to have displaced fracture of his left humerus. Has history of ETOH with concerns for DT's.  S/p ORIF proximal Left humerous 10/25 and remained on vent post-op.  Extubated on 10/25. Pt will benefit from skilled OT in the acute setting to maximize I with ADL and ADL mobility prior to d/c    OT Assessment  Patient needs continued OT Services    Follow Up Recommendations  Skilled nursing facility    Barriers to Discharge      Equipment Recommendations  None recommended by OT;None recommended by PT    Recommendations for Other Services    Frequency  Min 2X/week    Precautions / Restrictions Precautions Precautions: Fall Required Braces or Orthoses: Other Brace/Splint Other Brace/Splint: L UE sling Restrictions Weight Bearing Restrictions: Yes LUE Weight Bearing: Non weight bearing   Pertinent Vitals/Pain Pt with pain with any movement- did not rate due to cognition. Pt repositioned for pain relief.     ADL  Grooming: Maximal assistance;Wash/dry face Where Assessed - Grooming: Supported sitting Lower Body Bathing:  (+3total Assist; +2 for standing while other performed bathin) Toilet Transfer: Simulated;+2 Total assistance Toilet Transfer: Patient Percentage: 40% Toilet Transfer Method: Surveyor, minerals:  (bed to chair) Toileting - Clothing Manipulation and Hygiene: Performed (+3total) Where Assessed - Toileting Clothing Manipulation and Hygiene: Sit to stand from 3-in-1 or toilet Transfers/Ambulation Related to ADLs: see transfers section ADL Comments: pt with very little coherence during session; would switch back and forth from Spanish? to [place] although both were mostly  not understandable    OT Diagnosis: Generalized weakness;Acute pain  OT Problem List: Decreased strength;Decreased activity tolerance;Impaired balance (sitting and/or standing);Decreased cognition;Decreased knowledge of use of DME or AE;Decreased knowledge of precautions;Decreased safety awareness;Pain;Impaired UE functional use;Increased edema;Cardiopulmonary status limiting activity OT Treatment Interventions: Self-care/ADL training;DME and/or AE instruction;Therapeutic activities;Patient/family education;Balance training   OT Goals Acute Rehab OT Goals OT Goal Formulation: Patient unable to participate in goal setting Time For Goal Achievement: 11/26/11 Potential to Achieve Goals: Good ADL Goals Pt Will Perform Grooming: with mod assist;Sitting at sink;Sitting, edge of bed;Sitting, chair ADL Goal: Grooming - Progress: Goal set today Pt Will Transfer to Toilet: with mod assist;Stand pivot transfer;with DME ADL Goal: Toilet Transfer - Progress: Goal set today Pt Will Perform Toileting - Clothing Manipulation: with mod assist;Sitting on 3-in-1 or toilet ADL Goal: Toileting - Clothing Manipulation - Progress: Goal set today Additional ADL Goal #1: Pt will actively participate in greater than or equal to therapeutic activity with Min VC to attend in prep for increased activity tolerance with ADLs. 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  Patient Stated Goal: None stated   Prior Functioning     Home Living Lives With: Alone Available Help at Discharge: Family Type of Home: House Home Access: Stairs to enter [employer] of Steps: 6 Entrance Stairs-Rails: Left Home Layout: Two level Alternate Level Stairs-Number of Steps: 13 Alternate Level Stairs-Rails: Left Bathroom  Shower/Tub: Tub/shower unit;Curtain Bathroom Toilet: Standard Bathroom Accessibility: Yes How Accessible: Accessible via walker Home Adaptive  Equipment: Walker - rolling Prior Function Level of Independence: Independent Able to Take Stairs?: Yes Driving: Yes Vocation: Retired Comments: per pt report (unsure of reliability) Communication Communication: Receptive difficulties;Expressive difficulties Dominant Hand: Left         Vision/Perception Vision - Assessment Additional Comments: pt with eyes closed during majority of session   Cognition  Overall Cognitive Status: Impaired Area of Impairment: Attention;Memory;Following commands;Safety/judgement;Awareness of errors;Awareness of deficits;Problem solving Arousal/Alertness: Lethargic Behavior During Session: Restless Current Attention Level: Focused Following Commands: Follows one step commands inconsistently Safety/Judgement: Decreased awareness of safety precautions;Decreased awareness of need for assistance;Decreased safety judgement for tasks assessed    Extremity/Trunk Assessment Right Upper Extremity Assessment RUE ROM/Strength/Tone: Unable to fully assess;Due to impaired cognition Left Upper Extremity Assessment LUE ROM/Strength/Tone: Unable to fully assess;Due to impaired cognition     Mobility Bed Mobility Bed Mobility: Supine to Sit;Sitting - Scoot to Edge of Bed Supine to Sit: 1: +2 Total assist;HOB elevated Supine to Sit: Patient Percentage: 40% Sitting - Scoot to Edge of Bed: 3: Mod assist Details for Bed Mobility Assistance: pt following vc/tc's for dircection; truncal assist Transfers Sit to Stand: 1: +2 Total assist;With upper extremity assist;From bed Sit to Stand: Patient Percentage: 30% Stand to Sit: 1: +2 Total assist;With upper extremity assist;To chair/3-in-1;With armrests;To bed Stand to Sit: Patient Percentage: 30% Details for Transfer Assistance: vc's for UE placement and assist; significant  truncal assist     Shoulder Instructions     Exercise     Balance Balance Balance Assessed: Yes Static Sitting Balance Static Sitting - Balance Support: Right upper extremity supported;Feet supported Static Sitting - Level of Assistance: 2: Max assist Static Sitting - Comment/# of Minutes: 4 min prepping for stands and pericare Static Standing Balance Static Standing - Balance Support: During functional activity Static Standing - Level of Assistance: 2: Max assist   End of Session OT - End of Session Activity Tolerance: Patient limited by pain (and cognition) Patient left: with call bell/phone within reach;in chair;with chair alarm set Nurse Communication: Mobility status  GO     Arletha Marschke 11/12/2011, 3:21 PM

## 2011-11-13 LAB — BASIC METABOLIC PANEL
GFR calc Af Amer: >90 mL/min (ref >90)
GFR calc non Af Amer: >90 mL/min (ref >90)
Potassium: 3.2 mEq/L — ABNORMAL LOW (ref 3.5–5.1)
Sodium: 135 mEq/L (ref 135–145)

## 2011-11-13 LAB — MAGNESIUM: Magnesium: 1.2 mg/dL — ABNORMAL LOW (ref 1.5–2.5)

## 2011-11-13 MED ORDER — POTASSIUM CHLORIDE CRYS ER 20 MEQ PO TBCR
20.0000 meq | EXTENDED_RELEASE_TABLET | Freq: Three times a day (TID) | ORAL | Status: DC
  Administered 2011-11-13 (×2): 20 meq via ORAL
  Filled 2011-11-13 (×3): qty 1

## 2011-11-13 MED ORDER — MAGNESIUM SULFATE 40 MG/ML IJ SOLN
2.0000 g | Freq: Once | INTRAMUSCULAR | Status: AC
  Administered 2011-11-13: 2 g via INTRAVENOUS
  Filled 2011-11-13: qty 50

## 2011-11-13 NOTE — Progress Notes (Signed)
Patient being transferred to 5500 per MD order. Report called to Gigi Gin, RN on 5500. All VS WNL.

## 2011-11-13 NOTE — Progress Notes (Signed)
eLink Physician-Brief Progress Note Patient Name: [name] DOB: 28-Jul-1945 MRN: [id number]  Date of Service  11/13/2011   HPI/Events of Note   Hypomagnesemia   eICU Interventions  Magnesium replaced      ALBON,DANA 11/13/2011, 5:57 AM

## 2011-11-13 NOTE — Progress Notes (Signed)
Pt alert, sitting up in bed eating dinner.  Oriented to person, disoriented to place and situation.  PAINAD 0.  Will continue to monitor.    Pt does not like pudding- refusing to eat ensure pudding.    Pt wife asking about repeat head CT/MRI- pt has had frequent falls in the past with potential for head injury.    Maximino Greenland RN

## 2011-11-13 NOTE — Progress Notes (Signed)
Speech Language Pathology Dysphagia Treatment Patient Details Name: [name] MRN: [id number] DOB: 02-03-1945 Today's Date: 11/13/2011 Time: 2952-8413 SLP Time Calculation (min): 9 min  Assessment / Plan / Recommendation Clinical Impression  Pt alert, talkative but generally incoherent.   Amenable to eating a few boluses of honey-thick liquid.  Attentive to bolus with active oral prep and palpable swallow trigger; mod verbal cues needed at times to refrain from clamping down on metal spoon and releasing it.  Pt with overall adequate toleration of conservative liquids. Continues to require 1:1 assist for feeding/safety given cognitive changes. Recommend continuing Dysphagia 1/honey thick liquids pending improvements in mental status.  SLP to follow.    Diet Recommendation  Continue with Current Diet: Dysphagia 1 (puree);Honey-thick liquid;Other (comment) (spoon only)    SLP Plan Continue with current plan of care       Swallowing Goals  SLP Swallowing Goals Patient will consume recommended diet without observed clinical signs of aspiration with: Maximum assistance Swallow Study Goal #1 - Progress: Progressing toward goal Patient will utilize recommended strategies during swallow to increase swallowing safety with: Maximal cueing Swallow Study Goal #2 - Progress: Progressing toward goal  General Temperature Spikes Noted: No Respiratory Status: Room air Behavior/Cognition: Confused;Distractible;Requires cueing;Alert Oral Cavity - Dentition: Poor condition;Missing dentition Patient Positioning: Upright in bed  Oral Cavity - Oral Hygiene Does patient have any of the following "at risk" factors?: Diet - patient on thickened liquids Patient is AT RISK - Oral Care Protocol followed (see row info): Yes   Dysphagia Treatment Treatment focused on: Skilled observation of diet tolerance Treatment Methods/Modalities: Effortful swallow;Skilled observation Patient observed directly with  PO's: Yes Type of PO's observed: Honey-thick liquids Feeding: Needs assist Liquids provided via: Teaspoon Oral Phase Signs & Symptoms: Prolonged bolus formation;Prolonged oral phase Type of cueing: Verbal;Tactile Amount of cueing: Moderate   GO    [name] L. [name], [place] CCC/SLP Pager [phone]  Blenda Smith Sean 11/13/2011, 11:45 AM

## 2011-11-13 NOTE — Progress Notes (Signed)
[facility] Progress Note [place] TEAM 1 - Stepdown/ICU TEAM   HOARCE PELFREY AVW:[id number] DOB: [birth year] DOA: 11/02/2011 PCP: Ruthe Mannan, MD  Brief narrative: 66 yo admitted 10/22 after fall and found to have displaced fracture of his left humerus. Has hx of ETOH with concern for DT's. Had ORIF proximal Lt humerus 10/25, and remained on vent post-op. PCCM consulted to assist with vent management in the setting of DT's.  Significant PMHx HTN, Hyperlipidemia, GERD, BPH, Anxiety/depression.  ETT 10/24>>10/25 10/27 precedex gtt  10/28 precedex weaned off   Assessment/Plan:  Post-operative respiratory failure  Successfully extubated 10/25  Chronic bronchitis PRN albuterol  Alcohol abuse with DT's off precedex since 10/28 - thiamine, folic acid, MVI - remains quite confused, though much more alert - CT of head was unrevealing - B12 confirmed to be normal/high - continue QHS seroquel - avoid ativan as it appears to be compounding his persistent confusion - move out of SDU and attempt to restore normal sleep wake cycle   Hx of HTN currently normotensive - follow  Hyponatremia Resolved - likely related to volume  Hypokalemia Recurring - increase replacement dose and follow trend  Hypomagnesemia Remains low - was given IV dose this AM by "[facility]" - would like to get level to 2.0  Enterococcus UTI.  Completed Abx 10/26 - recheck UA not c/w UTI - d/c foley and follow for urinary retention   Lt humerus fracture s/p ORIF Post-op care per surgery - lovenox for prophylaxis  Dysphagia Failed MBS 10/26 and 10/27 - was cleared for modified diet 11/01 w/ SLP re-eval when more clear from MS standpoint - initiated prescribed diet - tolerating thus far  Hyperbilirubinemia with elevated ATS/ALT and rising alk phos Right upper quadrant [place] unrevealing - liver architecture unremarkable - alk phos trending down - coags normal   Anemia F/u CBC - transfuse for Hb < 7   Code  Status: DNR Disposition Plan: transfer to medical bed today - ultimate dispo is SNF placement   Consultants: Ortho  Procedures: OPEN REDUCTION INTERNAL FIXATION (ORIF) PROXIMAL HUMERUS FRACTURE (Left)  Antibiotics: 10/24 Ceftriaxone >> 10/26  DVT prophylaxis: lovenox  HPI/Subjective: Pt is more alert today, but remains quite confused.  He now opens his eyes and converses, though his speech is nonsensical.  No family is present at the time of my visit.     Objective: Blood pressure 123/46, pulse 106, temperature 98.9 F (37.2 C), temperature source Oral, resp. rate 19, height 5\' 9"  (1.753 m), weight 84.5 kg (186 lb 4.6 oz), SpO2 100.00%.  Intake/Output Summary (Last 24 hours) at 11/13/11 1025 Last data filed at 11/13/11 0800  Gross per 24 hour  Intake    210 ml  Output   1975 ml  Net  -1765 ml     Exam: General: No acute respiratory distress - alert but confused - not agitated today  Lungs: Clear to auscultation bilaterally without wheezes or crackles Cardiovascular: Regular rate and rhythm without murmur gallop or rub normal S1 and S2 Abdomen: Nontender, nondistended, soft, bowel sounds positive, no rebound, no ascites, no appreciable mass - noted ventral and umbilical hernia w/o tenderness  Extremities: No significant cyanosis, clubbing, or edema bilateral lower extremities  Data Reviewed: Basic Metabolic Panel:  Lab 11/13/11 8295 11/11/11 0425 11/10/11 0420 11/09/11 0440 11/08/11 0940 11/08/11 0925 11/07/11 0804  NA 135 138 138 138 -- 136 --  K 3.2* 3.4* 2.9* 4.4 -- 3.8 --  CL 104 105 105 103 --  103 --  CO2 24 25 23 25  -- 27 --  GLUCOSE 101* 98 111* 94 -- 133* --  BUN 6 7 6 7  -- 7 --  CREATININE 0.56 0.59 0.58 0.62 -- 0.55 --  CALCIUM 8.1* 8.1* 7.9* 8.0* -- 7.8* --  MG 1.2* 1.5 -- 1.5 1.4* -- --  PHOS -- -- -- -- -- -- 2.4   Liver Function Tests:  Lab 11/10/11 0420 11/09/11 0440 11/07/11 0804  AST 56* 96* 58*  ALT 24 30 20   ALKPHOS 259* 294* 221*  BILITOT  4.2* 4.6* 4.8*  PROT 5.4* 5.8* 4.9*  ALBUMIN 2.1* 2.2* 1.9*   CBC:  Lab 11/11/11 0425 11/10/11 0420 11/09/11 0440 11/08/11 0925 11/07/11 0804  WBC 8.3 7.6 7.1 6.2 6.8  NEUTROABS -- -- -- -- --  HGB 9.1* 8.9* 10.9* 9.5* 8.1*  HCT 27.3* 25.8* 31.7* 28.1* 23.5*  MCV 95.8 94.5 96.9 95.9 93.6  PLT 259 253 286 198 178   CBG:  Lab 11/11/11 0941 11/07/11 0732 11/06/11 1521 11/06/11 1127  GLUCAP 69* 128* 107* 69*    Recent Results (from the past 240 hour(s))  URINE CULTURE     Status: Normal   Collection Time   11/03/11 11:00 PM      Component Value Range Status Comment   Specimen Description URINE, RANDOM   Final    Special Requests NONE   Final    Culture  Setup Time 11/03/2011 23:54   Final    Colony Count 20,OOO COLONIES/ML   Final    Culture ENTEROCOCCUS SPECIES   Final    Report Status 11/05/2011 FINAL   Final    Organism ID, Bacteria ENTEROCOCCUS SPECIES   Final   URINE CULTURE     Status: Normal   Collection Time   11/06/11  8:22 PM      Component Value Range Status Comment   Specimen Description URINE, CATHETERIZED   Final    Special Requests NONE   Final    Culture  Setup Time 11/07/2011 02:01   Final    Colony Count NO GROWTH   Final    Culture NO GROWTH   Final    Report Status 11/08/2011 FINAL   Final      Studies:  Recent x-ray studies have been reviewed in detail by the Attending Physician  Scheduled Meds:  Reviewed in detail by the Attending Physician   Lonia Blood, MD [facility] Office  [phone] Pager [phone]  On-Call/Text Page:      [email or web address]      password [facility]  11/13/2011, 10:25 AM   LOS: 11 days

## 2011-11-14 DIAGNOSIS — F1020 Alcohol dependence, uncomplicated: Secondary | ICD-10-CM

## 2011-11-14 DIAGNOSIS — E876 Hypokalemia: Secondary | ICD-10-CM

## 2011-11-14 LAB — BASIC METABOLIC PANEL
Calcium: 7.9 mg/dL — ABNORMAL LOW (ref 8.4–10.5)
Creatinine, Ser: 0.61 mg/dL (ref 0.50–1.35)
GFR calc Af Amer: >90 mL/min (ref >90)
Sodium: 135 mEq/L (ref 135–145)

## 2011-11-14 LAB — MAGNESIUM: Magnesium: 1.4 mg/dL — ABNORMAL LOW (ref 1.5–2.5)

## 2011-11-14 LAB — CBC
MCHC: 33.8 g/dL (ref 30.0–36.0)
Platelets: 319 10*3/uL (ref 150–400)
RDW: 15.6 % — ABNORMAL HIGH (ref 11.5–15.5)
WBC: 8.6 10*3/uL (ref 4.0–10.5)

## 2011-11-14 MED ORDER — POTASSIUM CHLORIDE CRYS ER 20 MEQ PO TBCR
60.0000 meq | EXTENDED_RELEASE_TABLET | Freq: Four times a day (QID) | ORAL | Status: AC
  Administered 2011-11-14 (×2): 60 meq via ORAL
  Filled 2011-11-14 (×2): qty 3

## 2011-11-14 MED ORDER — MAGNESIUM SULFATE 40 MG/ML IJ SOLN
2.0000 g | Freq: Once | INTRAMUSCULAR | Status: AC
  Administered 2011-11-14: 2 g via INTRAVENOUS
  Filled 2011-11-14: qty 50

## 2011-11-14 NOTE — Progress Notes (Signed)
[facility] Progress Note    [name] ZOX:[id number] DOB: 10/07/1945 DOA: 11/02/2011 PCP: Ruthe Mannan, MD  Brief narrative: 66 yo admitted 10/22 after fall and found to have displaced fracture of his left humerus. Has hx of ETOH with concern for DT's. Had ORIF proximal Lt humerus 10/25, and remained on vent post-op. PCCM consulted to assist with vent management in the setting of DT's.  Significant PMHx HTN, Hyperlipidemia, GERD, BPH, Anxiety/depression.  ETT 10/24>>10/25 10/27 precedex gtt  10/28 precedex weaned off   Assessment/Plan:  Post-operative respiratory failure  Successfully extubated 10/25  Chronic bronchitis PRN albuterol  Alcohol abuse with DT's off precedex since 10/28 - thiamine, folic acid, MVI - remains quite confused, though much more alert - CT of head was unrevealing - B12 confirmed to be normal/high - continue QHS seroquel - avoid ativan as it appears to be compounding his persistent confusion - move out of SDU and attempt to restore normal sleep wake cycle   Hx of HTN currently normotensive - follow  Hyponatremia Resolved - likely related to volume  Hypokalemia Recurring - increase replacement dose and follow trend, replace magnesium, check BMP in a.m.  Hypomagnesemia Remains low - replace with IV supplements, check magnesium in a.m.  Enterococcus UTI.  Completed Abx 10/26 - recheck UA not c/w UTI - d/c foley and follow for urinary retention   Lt humerus fracture s/p ORIF Post-op care per surgery - lovenox for prophylaxis  Dysphagia Failed MBS 10/26 and 10/27 - was cleared for modified diet 11/01 w/ SLP re-eval when more clear from MS standpoint - initiated prescribed diet - tolerating thus far  Hyperbilirubinemia with elevated ATS/ALT and rising alk phos Right upper quadrant [place] unrevealing - liver architecture unremarkable - alk phos trending down - coags normal   Anemia F/u CBC - transfuse for Hb < 7   Code Status: DNR Disposition  Plan: SNF when bed available.  Consultants: Ortho  Procedures: OPEN REDUCTION INTERNAL FIXATION (ORIF) PROXIMAL HUMERUS FRACTURE (Left)  Antibiotics: 10/24 Ceftriaxone >> 10/26  DVT prophylaxis: lovenox  HPI/Subjective: He is awake and alert, continues to be confused and disoriented.   Objective: Blood pressure 149/87, pulse 97, temperature 98.3 F (36.8 C), temperature source Axillary, resp. rate 17, height 5\' 9"  (1.753 m), weight 86.1 kg (189 lb 13.1 oz), SpO2 94.00%.  Intake/Output Summary (Last 24 hours) at 11/14/11 1059 Last data filed at 11/14/11 0847  Gross per 24 hour  Intake      0 ml  Output    700 ml  Net   -700 ml     Exam: General: No acute respiratory distress - alert but confused - not agitated today  Lungs: Clear to auscultation bilaterally without wheezes or crackles Cardiovascular: Regular rate and rhythm without murmur gallop or rub normal S1 and S2 Abdomen: Nontender, nondistended, soft, bowel sounds positive, no rebound, no ascites, no appreciable mass - noted ventral and umbilical hernia w/o tenderness  Extremities: No significant cyanosis, clubbing, or edema bilateral lower extremities  Data Reviewed: Basic Metabolic Panel:  Lab 11/14/11 8119 11/13/11 0500 11/11/11 0425 11/10/11 0420 11/09/11 0440 11/08/11 0940  NA 135 135 138 138 138 --  K 3.1* 3.2* 3.4* 2.9* 4.4 --  CL 102 104 105 105 103 --  CO2 24 24 25 23 25  --  GLUCOSE 92 101* 98 111* 94 --  BUN 7 6 7 6 7  --  CREATININE 0.61 0.56 0.59 0.58 0.62 --  CALCIUM 7.9* 8.1* 8.1* 7.9* 8.0* --  MG 1.4* 1.2* 1.5 -- 1.5 1.4*  PHOS -- -- -- -- -- --   Liver Function Tests:  Lab 11/10/11 0420 11/09/11 0440  AST 56* 96*  ALT 24 30  ALKPHOS 259* 294*  BILITOT 4.2* 4.6*  PROT 5.4* 5.8*  ALBUMIN 2.1* 2.2*   CBC:  Lab 11/14/11 0550 11/11/11 0425 11/10/11 0420 11/09/11 0440 11/08/11 0925  WBC 8.6 8.3 7.6 7.1 6.2  NEUTROABS -- -- -- -- --  HGB 8.9* 9.1* 8.9* 10.9* 9.5*  HCT 26.3* 27.3*  25.8* 31.7* 28.1*  MCV 93.3 95.8 94.5 96.9 95.9  PLT 319 259 253 286 198   CBG:  Lab 11/11/11 0941  GLUCAP 69*    Recent Results (from the past 240 hour(s))  URINE CULTURE     Status: Normal   Collection Time   11/06/11  8:22 PM      Component Value Range Status Comment   Specimen Description URINE, CATHETERIZED   Final    Special Requests NONE   Final    Culture  Setup Time 11/07/2011 02:01   Final    Colony Count NO GROWTH   Final    Culture NO GROWTH   Final    Report Status 11/08/2011 FINAL   Final      Studies:  Recent x-ray studies have been reviewed in detail by the Attending Physician  Scheduled Meds:  Reviewed in detail by the Attending Physician   [facility] Office  [phone] Pager [phone]  On-Call/Text Page:      [email or web address]      password [facility]  11/14/2011, 10:59 AM   LOS: 12 days

## 2011-11-14 NOTE — Plan of Care (Signed)
Problem: Phase I Progression Outcomes Goal: Initial discharge plan identified Outcome: Completed/Met Date Met:  11/14/11 SNF placement pending

## 2011-11-15 LAB — COMPREHENSIVE METABOLIC PANEL
ALT: 28 U/L (ref 0–53)
AST: 62 U/L — ABNORMAL HIGH (ref 0–37)
Albumin: 2.1 g/dL — ABNORMAL LOW (ref 3.5–5.2)
Alkaline Phosphatase: 241 U/L — ABNORMAL HIGH (ref 39–117)
Potassium: 3.8 mEq/L (ref 3.5–5.1)
Sodium: 135 mEq/L (ref 135–145)
Total Protein: 5.6 g/dL — ABNORMAL LOW (ref 6.0–8.3)

## 2011-11-15 LAB — CBC
HCT: 27.3 % — ABNORMAL LOW (ref 39.0–52.0)
MCH: 31.6 pg (ref 26.0–34.0)
MCHC: 33.7 g/dL (ref 30.0–36.0)
MCV: 93.8 fL (ref 78.0–100.0)
RDW: 15.6 % — ABNORMAL HIGH (ref 11.5–15.5)
WBC: 9.1 10*3/uL (ref 4.0–10.5)

## 2011-11-15 MED ORDER — MAGNESIUM SULFATE 40 MG/ML IJ SOLN
2.0000 g | Freq: Once | INTRAMUSCULAR | Status: AC
  Administered 2011-11-15: 2 g via INTRAVENOUS
  Filled 2011-11-15: qty 50

## 2011-11-15 MED ORDER — ALBUTEROL SULFATE (5 MG/ML) 0.5% IN NEBU: 2.5000 mg | INHALATION_SOLUTION | RESPIRATORY_TRACT | Status: DC | PRN

## 2011-11-15 MED ORDER — FOLIC ACID 1 MG PO TABS: 1.0000 mg | ORAL_TABLET | Freq: Every day | ORAL | Status: DC

## 2011-11-15 MED ORDER — POTASSIUM CHLORIDE CRYS ER 20 MEQ PO TBCR
40.0000 meq | EXTENDED_RELEASE_TABLET | Freq: Once | ORAL | Status: AC
  Administered 2011-11-15: 40 meq via ORAL
  Filled 2011-11-15: qty 2

## 2011-11-15 MED ORDER — ENSURE PUDDING PO PUDG: 1.0000 | Freq: Three times a day (TID) | ORAL | Status: DC

## 2011-11-15 MED ORDER — MAGNESIUM CHLORIDE 64 MG PO TBEC
1.0000 | DELAYED_RELEASE_TABLET | Freq: Every day | ORAL | Status: DC
  Administered 2011-11-15 – 2011-11-16 (×2): 64 mg via ORAL
  Filled 2011-11-15 (×2): qty 1

## 2011-11-15 MED ORDER — MAGNESIUM CHLORIDE 64 MG PO TBEC: 1.0000 | DELAYED_RELEASE_TABLET | Freq: Every day | ORAL | Status: DC

## 2011-11-15 MED ORDER — QUETIAPINE FUMARATE 200 MG PO TABS: 200.0000 mg | ORAL_TABLET | Freq: Every day | ORAL | Status: DC

## 2011-11-15 NOTE — Progress Notes (Signed)
Occupational Therapy Treatment Patient Details Name: [name] MRN: [id number] DOB: 05-10-1945 Today's Date: 11/15/2011 Time: 4098-1191 OT Time Calculation (min): 23 min  OT Assessment / Plan / Recommendation Comments on Treatment Session 66 yo s/p fall with L humerus fracture - ORIF. Pt making slow progress. Will need SNF. Completed gentle PROM LUE per PA orders. Pt attempting to move LUE independently, Pt not aware of precautions or that he has a broken arm. Discussed mobility and precautions with nursing. Will continue to follw acutely.    Follow Up Recommendations  Skilled nursing facility    Barriers to Discharge       Equipment Recommendations  None recommended by OT;None recommended by PT    Recommendations for Other Services    Frequency Min 2X/week   Plan Discharge plan remains appropriate    Precautions / Restrictions Precautions Precautions: Fall Required Braces or Orthoses: Other Brace/Splint Other Brace/Splint: L UE sling Restrictions Weight Bearing Restrictions: Yes LUE Weight Bearing: Non weight bearing   Pertinent Vitals/Pain No c/o    ADL  Grooming: Wash/dry face;Teeth care;Brushing hair;Moderate assistance Where Assessed - Grooming: Supported sitting Equipment Used: Gait belt Transfers/Ambulation Related to ADLs: +2 total  assist. "automatic stepping pattern" without upright posture ADL Comments: limited by cognitive deficits. requires hand over hand to initiate and motor plan during simple groomin g task. Pt was unable to orient comb appropriately to begin activity. REquires constant vc to maintain focused attention. Internally distracted. Poor intelectual awareness.     OT Diagnosis:    OT Problem List:   OT Treatment Interventions:     OT Goals Acute Rehab OT Goals OT Goal Formulation: Patient unable to participate in goal setting Time For Goal Achievement: 11/26/11 ADL Goals Pt Will Perform Grooming: with mod assist;Sitting at sink;Sitting,  edge of bed;Sitting, chair ADL Goal: Grooming - Progress: Progressing toward goals Pt Will Transfer to Toilet: with mod assist;Stand pivot transfer;with DME ADL Goal: Toilet Transfer - Progress: Progressing toward goals Pt Will Perform Toileting - Clothing Manipulation: with mod assist;Sitting on 3-in-1 or toilet Additional ADL Goal #1: Pt will actively participate in greater than or equal to therapeutic activity with Min VC to attend in prep for increased activity tolerance with ADLs. ADL Goal: Additional Goal #1 - Progress: Progressing toward goals  Visit Information  Last OT Received On: 11/15/11 Assistance Needed: +2 PT/OT Co-Evaluation/Treatment: Yes    Subjective Data      Prior Functioning       Cognition  Overall Cognitive Status: Impaired Area of Impairment: Attention;Memory;Following commands;Safety/judgement;Awareness of errors;Awareness of deficits;Problem solving;Executive functioning Arousal/Alertness: Awake/alert Orientation Level: Disoriented X4 Behavior During Session: Restless Current Attention Level: Focused Memory: Decreased recall of precautions Memory Deficits: pt with no recall of situation or events since being in hospital.   Following Commands: Follows one step commands inconsistently Safety/Judgement: Decreased awareness of safety precautions;Decreased awareness of need for assistance;Decreased safety judgement for tasks assessed Safety/Judgement - Other Comments: trying to puah through LUE to stand regardless of constant cuing of NWB status Awareness of Errors: Assistance required to identify errors made;Assistance required to correct errors made Awareness of Errors - Other Comments: poor intelectual awareness Awareness of Deficits: pt unaware of L humerus fx Problem Solving: pt unable to attend to tasks for problem solving.   Executive Functioning: poor attention, inhibitory behaviors, poor intiation    Mobility  Shoulder Instructions Bed  Mobility Bed Mobility: Supine to Sit;Sitting - Scoot to Edge of Bed Supine to Sit: 1: +2 Total  assist;HOB elevated Supine to Sit: Patient Percentage: 40% Sitting - Scoot to Edge of Bed: 3: Mod assist Sitting - Scoot to Edge of Bed: Patient Percentage: 20% Transfers Transfers: Sit to Stand;Stand to Sit Sit to Stand: 1: +2 Total assist;From bed Sit to Stand: Patient Percentage: 20% Stand to Sit: 1: +2 Total assist;To chair/3-in-1 Stand to Sit: Patient Percentage: 20% Details for Transfer Assistance: poor participation with transfers       Exercises      Balance Static Sitting Balance Static Sitting - Balance Support: Feet supported Static Sitting - Level of Assistance: 2: Max assist Static Sitting - Comment/# of Minutes:  (unable to maintain static sitting without support -)   End of Session OT - End of Session Equipment Utilized During Treatment: Gait belt Activity Tolerance: Other (comment) (limited by cognitive deficits) Patient left: in chair;with call bell/phone within reach;with chair alarm set;with nursing in room Nurse Communication: Mobility status;Weight bearing status;Precautions  GO     Trai Ells,HILLARY 11/15/2011, 11:54 AM Luisa Dago, OTR/L  [phone] 11/15/2011

## 2011-11-15 NOTE — Progress Notes (Signed)
Physical Therapy Note   11/15/11 1300  PT Visit Information  Last PT Received On 11/15/11  Assistance Needed +2  PT Time Calculation  PT Start Time 1320  PT Stop Time 1337  PT Time Calculation (min) 17 min  Subjective Data  Subjective I think they put too much garlic in it.    Precautions  Precautions Fall  Required Braces or Orthoses Other Brace/Splint  Other Brace/Splint L UE sling  Restrictions  Weight Bearing Restrictions Yes  LUE Weight Bearing NWB  Cognition  Overall Cognitive Status Impaired  Area of Impairment Attention;Memory;Following commands;Safety/judgement;Awareness of errors;Awareness of deficits;Problem solving  Arousal/Alertness Awake/alert  Orientation Level Disoriented to;Place;Time;Situation  Behavior During Session Restless  Current Attention Level Focused  Memory Deficits pt with no recall of any events since coming to hospital.    Following Commands Follows one step commands inconsistently  Safety/Judgement Decreased safety judgement for tasks assessed;Impulsive;Decreased awareness of need for assistance  Awareness of Errors Assistance required to identify errors made;Assistance required to correct errors made  Awareness of Deficits pt unaware of L humerus fx  Problem Solving pt unable to attend to tasks for problem solving.    Bed Mobility  Bed Mobility Not assessed  Transfers  Transfers Sit to Stand;Stand to Sit  Sit to Stand 1: +2 Total assist;With upper extremity assist;From chair/3-in-1;With armrests  Sit to Stand: Patient Percentage 20%  Stand to Sit 1: +2 Total assist;With upper extremity assist;To chair/3-in-1;With armrests  Stand to Sit: Patient Percentage 20%  Details for Transfer Assistance pt had been trying to get up with Nsg student in room.  Nsg student A with sit to stand to reposition pt in chair and reposition chair alarm pad under pt.  Max cueing for safe technique and sequence through sit to stand.  pt inially A with stand, but  remains squated and very difficult to maintain standing.  pt unable to maintain attention on task.    Ambulation/Gait  Ambulation/Gait Assistance Not tested (comment)  Stairs No  Wheelchair Mobility  Wheelchair Mobility No  Balance  Balance Assessed No  PT - End of Session  Equipment Utilized During Treatment Gait belt  Activity Tolerance Patient tolerated treatment well  Patient left in chair;with call bell/phone within reach;with chair alarm set;with nursing in room  Nurse Communication Mobility status;Need for lift equipment  PT - Assessment/Plan  Comments on Treatment Session pt presents with fall, L humerus fx s/p ORIF, Etoh, and AMS.  pt had been attempting to get out of chair with only Nsg student in room.  Nsg student A PT with repositioning pt and chair alarm for safety.  Again pt's mobility and participation limited by cognitive deficits.    PT Plan Discharge plan remains appropriate;Frequency needs to be updated  PT Frequency Min 3X/week  Follow Up Recommendations Post acute inpatient  Does the patient have the potential to tolerate intense rehabilitation? No, Recommend SNF  Equipment Recommended None recommended by OT;None recommended by PT  Acute Rehab PT Goals  Time For Goal Achievement 11/17/11  PT Goal: Sit to Stand - Progress Progressing toward goal  PT Goal: Stand to Sit - Progress Progressing toward goal  PT Goal: Stand - Progress Progressing toward goal  PT General Charges  $$ ACUTE PT VISIT 1 Procedure  PT Treatments  $Therapeutic Activity 8-22 mins    Chanae Gemma, PT [phone]

## 2011-11-15 NOTE — Progress Notes (Signed)
Speech Language Pathology Dysphagia Treatment Patient Details Name: [name] MRN: [id number] DOB: 05-04-1945 Today's Date: 11/15/2011 Time: 4098-1191 SLP Time Calculation (min): 25 min  Assessment / Plan / Recommendation Clinical Impression  Pt preparing for D/C to SNF today.  F/u for dysphagia to determine appropriateness for diet upgrade prior to D/C.  Pt sitting in recliner; remains quite confused, incoherent thought processes.  Consumed trials of thin liquids with immediate symptoms of aspiration; trials of nectars tolerated well with good oral awareness, no evidence of aspiration, even with copious self-administered sips.  Pt's safe consumption of POs will rely heavily on those who are helping him eat at the facility;  will need hand-over-hand assist with small sips from cup and use of utensils.  Rec D/C on pureed diet with nectar-thick liquids, meds whole with puree, and return for OPMBS in 2-3 weeks.      Diet Recommendation  Initiate / Change Diet: Dysphagia 1 (puree);Nectar-thick liquid    SLP Plan Discharge SLP treatment due to (comment);Other (Comment) (D/C to SNF)       Swallowing Goals  SLP Swallowing Goals Patient will consume recommended diet without observed clinical signs of aspiration with: Max assistance Swallow Study Goal #1 - Progress: Met Patient will utilize recommended strategies during swallow to increase swallowing safety with: Maximal cueing Swallow Study Goal #2 - Progress: Met Swallow Study Goal #3 - Progress: Met  General Temperature Spikes Noted: No Respiratory Status: Room air Behavior/Cognition: Confused;Distractible;Requires cueing;Alert Oral Cavity - Dentition: Poor condition;Missing dentition Patient Positioning: Upright in chair  Oral Cavity - Oral Hygiene Does patient have any of the following "at risk" factors?: Diet - patient on thickened liquids   Dysphagia Treatment Treatment focused on: Skilled observation of diet tolerance;Upgraded  PO texture trials Treatment Methods/Modalities: Effortful swallow Patient observed directly with PO's: Yes Type of PO's observed: Honey-thick liquids;Nectar-thick liquids;Thin liquids Feeding: Needs assist Liquids provided via: Cup;Teaspoon Oral Phase Signs & Symptoms: Prolonged bolus formation;Prolonged oral phase Pharyngeal Phase Signs & Symptoms: Multiple swallows;Immediate cough;Other (comment) (cough with thin liquid trials.  adequate toleration nectar t) Type of cueing: Verbal;Tactile Amount of cueing: Moderate   GO    .ACSIG[ Carolan Shiver 11/15/2011, 11:34 AM

## 2011-11-15 NOTE — Discharge Summary (Addendum)
Physician Discharge Summary  [name] WGN:[id number] DOB: 04-21-1945 DOA: 11/02/2011  PCP: Ruthe Mannan, MD  Admit date: 11/02/2011 Discharge date: 11/16/2011  Time spent: 45 minutes  Recommendations for Outpatient Follow-up:  1. Check electrolytes (particularly potassium and magnesium) and LFTs on Wednesday 11/6.   2. Patient not eating well.  On dysphagia 1 diet with necter thick liquids.  Needs Aspiration precautions, Speech Therapy, 1:1 assistance with eating.  Encouragement to eat.  PLEASE schedule a repeat speech evaluation at [facility] in 3 weeks to determine if her can return to a regular diet. 3. Follow up with Dr. Carola Frost, Orthopedic Surgery in one week.  Discharge Diagnoses:  Active Problems:  HYPERTENSION  Alcohol abuse  Hyponatremia  Fracture of humerus, proximal, left, closed  Metabolic acidosis  Leukocytosis  Respiratory failure, post-operative  Delirium tremens   Discharge Condition: physically stable, mentally confused, but pleasant  Diet recommendation: Dysphagia 1 with necter thick liquids.  Needs 1:1 assistance eating.  On ensure supplements but does not like sweets.  Filed Weights   11/13/11 0358 11/14/11 0500 11/15/11 0420  Weight: 84.5 kg (186 lb 4.6 oz) 86.1 kg (189 lb 13.1 oz) 84.5 kg (186 lb 4.6 oz)    History of present illness:  66 yo male with history of alcoholism, memory loss, frequent falls, hiatal hernia, chronic bronchitis, peripheral vascular disease, and BPH, presented to the ED on October 22 after falling at home that morning.  X-rays showed a displaced fracture of his left humerus.  Lab work showed hyponatremia at 129, anion gap of 16, elevated WBC at 15. Orthopedic surgery was consulted for his humerus fracture and [facility] was consulted for admission do to metabolic abnormalities.  Hospital Course:   Lt humerus fracture s/p ORIF  Mr. Rothmeyer underwent ORIF of his proximal left humerus on October 24.  He is nonweightbearing in his left  arm.  Dressing changes or when necessary. Physical therapy/occupational therapy-gentle shoulder PROM and AAROM left shoulder as pain allows (very gentle IR and ER), focus on pendulums, flexion and extension. Active and passive ROM left elbow forearm wrist and hand.  He will have followup with Dr. Carola Frost one week post discharge.  Post-operative respiratory failure  Mr. Hemple was left on the ventilator after surgery in order to prevent respiratory failure from delirium tremens. He was successfully extubated 10/25.  He is now breathing normally on his own.   Alcohol abuse with DT's  Critical care was consulted after surgery to manage a precedex drip.  The Precedex was discontinued 10/28. The patient remained on thiamine, folic acid, MVI.  He remains quite confused, though much more alert.  CT of head was unrevealing.  B12 confirmed to be normal/high. Will continue QHS seroquel.  Avoid ativan as it appears to compound his persistent confusion.    Confabulation.  ? Alcoholic dementia? Per his wife, Mr. Shallow was intermittantly confused over the last two years.  Since admission, Mr. Majewski has been extensively confused and frequently confabulates answers to questions.   He seems unable to comprehend how weak he is, and that it is unsafe to stand on his own.  Ativan and Haldol only served to worsen his confusion.  There is no reversible cause of dementia.  CT head was negative.   Mr. Fornoff needs 24 hour assistance.  Chronic bronchitis  Stable, PRN albuterol   Hx of HTN  currently normotensive - follow   Hyponatremia  Resolved - likely related to volume and diuretic therapy.   Hypokalemia  Was recurrent during this hospitalization. He required multiple doses of potassium supplementation. Would recommend ongoing monitoring of his potassium.  Particularly while he is on spironolactone diuretic.  Hypomagnesemia  Remains low will start on Slow-Mag 64 mg daily tablet. In request serum magnesium be checked  in the next 3-4 days.  Enterococcus UTI.  Completed Abx 10/26 - recheck UA showed no UTI.  Monitor for urinary retention.  On Hytrin.  Dysphagia  Failed MBS 10/26 and 10/27 - was cleared for modified diet 11/01.  Recommend Speech Therapy at SNF.  Patient needs 1:1 assistance with eating.  Has been eating very little.  Hyperbilirubinemia with elevated ATS/ALT and rising alk phos  Right upper quadrant [place] unrevealing - liver architecture unremarkable - alk phos and bilirubin trending down - coags normal.    Anemia  F/u CBC.  No frank bleeding.  Hgb stable.  Code Status: DNR  Procedures:  ORIF  Left humerus.  October 24.   Consultations: Orthopedic surgery Critical care pulmonary medicine   Discharge Exam: Filed Vitals:   11/15/11 1400 11/15/11 2051 11/16/11 0553 11/16/11 0611  BP: 114/57 142/82 116/43 116/72  Pulse: 102 101 89   Temp: 97.8 F (36.6 C) 98.3 F (36.8 C) 98.4 F (36.9 C)   TempSrc: Oral Oral Oral   Resp: 18 17 16    Height:      Weight:      SpO2: 97% 98% 98%     General: Awake, rambling, alert.  Unintelligible answers to questions. Cardiovascular: RRR no M/R/G Respiratory: CTA no W/C/ Abdomen:  Soft NT, ND, +BS Extremities:  No Edema. Psychiatric:  The patient is pleasant - but tells me he moved to [place] in 1913 when the government decided he needed some exercise.    Discharge Instructions  Discharge Orders    Future Orders Please Complete By Expires   Diet - low sodium heart healthy      Increase activity slowly      Discharge instructions      Comments:   Monitor potassium, magnesium. Encourage eating.  Patient with very little appetite.  Needs SLP Therapy and 1:1 assistance with eating.       Medication List     As of 11/16/2011 11:55 AM    STOP taking these medications         cyclobenzaprine 10 MG tablet   Commonly known as: FLEXERIL      promethazine 25 MG tablet   Commonly known as: PHENERGAN      TAKE these medications           albuterol (5 MG/ML) 0.5% nebulizer solution   Commonly known as: PROVENTIL   Take 0.5 mLs (2.5 mg total) by nebulization every 2 (two) hours as needed for wheezing or shortness of breath.      b complex vitamins capsule   Take 1 capsule by mouth daily.      feeding supplement Pudg   Take 1 Container by mouth 3 (three) times daily between meals.      fexofenadine 180 MG tablet   Commonly known as: ALLEGRA   Take 180 mg by mouth daily as needed. For allergies      fluticasone 50 MCG/ACT nasal spray   Commonly known as: FLONASE   Place 2 sprays into the nose as needed. For allergies      folic acid 1 MG tablet   Commonly known as: FOLVITE   Take 1 tablet (1 mg total) by mouth daily.  ibuprofen 200 MG tablet   Commonly known as: ADVIL,MOTRIN   Take 200 mg by mouth every 6 (six) hours as needed. For pain      magnesium chloride 64 MG Tbec   Commonly known as: SLOW-MAG   Take 1 tablet (64 mg total) by mouth daily.      multivitamin with minerals Tabs   Take 1 tablet by mouth daily.      pantoprazole 40 MG tablet   Commonly known as: PROTONIX   Take 40 mg by mouth 2 (two) times daily.      QUEtiapine 200 MG tablet   Commonly known as: SEROQUEL   Take 1 tablet (200 mg total) by mouth at bedtime.      spironolactone 25 MG tablet   Commonly known as: ALDACTONE   Take 1 tablet (25 mg total) by mouth daily.      terazosin 2 MG capsule   Commonly known as: HYTRIN   Take 1 capsule (2 mg total) by mouth at bedtime.      thiamine 100 MG tablet   Commonly known as: VITAMIN B-1   Take 100 mg by mouth daily.         Follow-up Information    Follow up with HANDY,MICHAEL H, MD. Schedule an appointment as soon as possible for a visit in 1 week.   Contact information:   [address] [place] [address] [phone]           The results of significant diagnostics from this hospitalization (including imaging, microbiology, ancillary and  laboratory) are listed below for reference.    Significant Diagnostic Studies: Dg Chest 1 View  11/02/2011  *RADIOLOGY REPORT*  Clinical Data: Fall, left humerus deformity  CHEST - 1 VIEW  Comparison: Shoulder films 10/22/ 2013, chest film 09/07/2010  Findings: Left humeral head fracture noted.  Normal cardiac silhouette. The ascending aorta is ectatic.  No effusion, infiltrate, or pneumothorax.  IMPRESSION: No acute cardiopulmonary process.  Ectatic aorta.   Original Report Authenticated By: Genevive Bi, M.D.    Dg Lumbar Spine Complete  11/03/2011  *RADIOLOGY REPORT*  Clinical Data: Multiple falls  LUMBAR SPINE - COMPLETE 4+ VIEW  Comparison: None Correlation:  CT abdomen pelvis 06/23/2009  Findings: Osseous demineralization. Vertebral body heights maintained without fracture or subluxation. Minimal scattered disc space narrowing and endplate spur formation. No definite spondylolysis or bone destruction. Minimal scattered facet degenerative changes. Mild right hip joint space narrowing. SI joints symmetric. Scattered vascular calcifications. Numerous air-filled loops of large and small bowel throughout abdomen.  IMPRESSION: Osseous demineralization. Mild scattered degenerative disc and facet disease changes. No acute abnormalities.   Original Report Authenticated By: Lollie Marrow, M.D.    Dg Forearm Left  11/02/2011  *RADIOLOGY REPORT*  Clinical Data: Fall  LEFT FOREARM - 2 VIEW  Comparison: None.  Findings: Two views of the left forearm submitted.  No acute fracture or subluxation.  Diffuse osteopenia.  IMPRESSION: No acute fracture or subluxation.   Original Report Authenticated By: Natasha Mead, M.D.    Ct Head Wo Contrast  11/06/2011  *RADIOLOGY REPORT*  Clinical Data: 66 year old male with altered mental status and frequent falls.  CT HEAD WITHOUT CONTRAST  Technique:  Contiguous axial images were obtained from the base of the skull through the vertex without contrast.  Comparison:  12/26/2009.  Findings: Visualized paranasal sinuses and mastoids are clear. Study is intermittently degraded by motion artifact despite repeated imaging attempts.  No acute orbit or scalp  soft tissue findings identified.  Stable benign vascular lesion at the right vertex. No acute osseous abnormality identified.  Calcified atherosclerosis at the skull base.  Stable cerebral volume.  No ventriculomegaly. No midline shift, mass effect, or evidence of mass lesion.  No evidence of cortically based acute infarction identified.  No suspicious intracranial vascular hyperdensity.  IMPRESSION: Intermittently degraded by motion artifact. No acute intracranial abnormality.   Original Report Authenticated By: Harley Hallmark, M.D.    US Abdomen Complete  11/09/2011  *RADIOLOGY REPORT*  Clinical Data:  Elevated LFTs and alk phos.  COMPLETE ABDOMINAL ULTRASOUND  Comparison:  12/27/2009 and earlier studies  Findings:  Gallbladder:  Layering sludge and some subcentimeter shadowing calculi in the dependent aspect.  Gallbladder wall measures up to 3.7 mm in thickness.  No pericholecystic fluid.  Sonographer reports no sonographic Murphy's sign.  Common bile duct:  4.1 mm diameter, unremarkable.  Liver:  Coarse increased echotexture without focal lesion or intrahepatic biliary ductal dilatation.  IVC:  Appears normal.  Pancreas:  Largely obscured by overlying bowel gas.  Spleen:  7.2 cm craniocaudal length, unremarkable.  Right Kidney:  10.2 cm. No hydronephrosis.  Well-preserved cortex. Normal size and parenchymal echotexture without focal abnormalities.  Left Kidney:  12.8 cm. No hydronephrosis.  Well-preserved cortex. Normal size and parenchymal echotexture without focal abnormalities. .  Abdominal aorta:  Largely obscured by overlying bowel gas.  IMPRESSION: 1.  Cholelithiasis with borderline gallbladder wall   thickening but no other ultrasound evidence of cholecystitis or biliary obstruction. 2.  Asymmetric renal sizes,    left greater than right, suggesting right renal artery   stenosis or previous parenchymal insult.   Original Report Authenticated By: Osa Craver, M.D.    Ct Shoulder Left Wo Contrast  11/02/2011  *RADIOLOGY REPORT*  Clinical Data: proximal humerus fx  CT OF THE LEFT SHOULDER WITHOUT CONTRAST  Technique:  Multidetector CT imaging was performed according to the standard protocol. Multiplanar CT image reconstructions were also generated.  Comparison: 11/02/2011  Findings: Left proximal humeral fracture noted with approximately 2 cm of primarily anterior displacement of the distal fracture fragment back to the humeral head, and with comminution along the dominant fracture plane as well as 1.5 cm of overlap.  Nondisplaced fracture extends through the midportion of the greater tuberosity. No lesser tuberosity fracture observed.  The long head of the biceps in the bicipital groove extends adjacent to various fracture fragments but does not appear to be entrapped in a fracture plane. One of the fragments does potentially slightly impinge on the distal long head of the biceps tendon on image 88 of series 2.  The humeral head does articulate with the glenoid and is mildly internally rotated.  Expected surrounding soft tissue hematoma noted particularly apparent along the anterior portion the subscapularis and tracking in the right axilla and along the short head of the biceps.  There is a tiny linear calcification along the origin of the short head of the biceps but I doubt that the short head is avulsed.  The acromial undersurface is type 2 (curved).  There is likely some intramuscular hematoma/edema in the anterior deltoid.  IMPRESSION:  1.  Three-part proximal humeral fracture involving the surgical neck and greater tuberosity, with 2 cm of displacement and about 1.5 cm of overlap, and with hematoma tracking along the anterior musculature including the deltoid, short head of the biceps, and along the  anterior margin of the subscapularis.   Original Report Authenticated By: Zollie Beckers  D. Ova Freshwater, M.D.    Dg Chest Port 1 View  11/12/2011  *RADIOLOGY REPORT*  Clinical Data: Line placement.  PORTABLE CHEST - 1 VIEW  Comparison: 11/05/2011  Findings: Right PICC line is in place with the tip in the mid right atrium approximately 5 cm deep to the cavoatrial junction.  There is cardiomegaly.  Low lung volumes.  No overt edema, effusions or acute bony abnormality.  IMPRESSION: Right PICC line tip in the right atrium approximately 5 cm deep to the cavoatrial junction.  Cardiomegaly.  Low lung volumes.   Original Report Authenticated By: Charlett Nose, M.D.    Dg Chest Port 1 View  11/05/2011  *RADIOLOGY REPORT*  Clinical Data: Check endotracheal tube.  PORTABLE CHEST - 1 VIEW  Comparison: 11/02/2011  Findings: Endotracheal tube is 3.6 cm above the carina.  There are low lung volumes.  Patchy densities in the lower lungs most likely represent atelectasis.  Heart size is likely accentuated by the portable technique and low lung volumes. Internal fixation of the proximal left humerus.  IMPRESSION: Endotracheal tube is appropriately positioned above the carina.  Low lung volumes.   Original Report Authenticated By: Richarda Overlie, M.D.    Dg Shoulder Left  11/05/2011  *RADIOLOGY REPORT*  Clinical Data: Fracture fixation.  LEFT SHOULDER - 2+ VIEW, DG C-ARM 61-120 MIN  Comparison: There is CT left shoulder 11/02/2011.  Findings: Three fluoroscopic spot views are provided.  Images demonstrate placement plate and screws for fixation of a surgical neck fracture of the humerus.  Hardware is intact.  No acute abnormality.  IMPRESSION: ORIF left surgical neck fracture.   Original Report Authenticated By: Bernadene Bell. D'ALESSIO, M.D.    Dg Shoulder Left Port  11/05/2011  *RADIOLOGY REPORT*  Clinical Data: Left humeral fracture.  PORTABLE LEFT SHOULDER - 2+ VIEW  Comparison: 11/02/2011 and 11/04/2011  Findings: Two views of the  left shoulder demonstrates plate and screw fixation of the comminuted proximal humeral fracture.  The humeral head appears to be located.  Left AC joint is intact and surgical skin staples are present.  IMPRESSION: Internal fixation of the proximal left humeral fracture.   Original Report Authenticated By: Richarda Overlie, M.D.    Dg Humerus Left  11/02/2011  *RADIOLOGY REPORT*  Clinical Data: Fall  LEFT HUMERUS - 2+ VIEW  Comparison: None.  Findings: Three views of the left humerus submitted.  There is displaced subcapital fracture of the left humeral neck.  IMPRESSION: Displaced fracture of proximal left humerus.   Original Report Authenticated By: Natasha Mead, M.D.    Dg Swallowing Func-speech Pathology   11/12/2011, 9:48 AM     Dg C-arm 61-120 Min  11/05/2011  *RADIOLOGY REPORT*  Clinical Data: Fracture fixation.  LEFT SHOULDER - 2+ VIEW, DG C-ARM 61-120 MIN  Comparison: There is CT left shoulder 11/02/2011.  Findings: Three fluoroscopic spot views are provided.  Images demonstrate placement plate and screws for fixation of a surgical neck fracture of the humerus.  Hardware is intact.  No acute abnormality.  IMPRESSION: ORIF left surgical neck fracture.   Original Report Authenticated By: Bernadene Bell. Maricela Curet, M.D.     Microbiology: Recent Results (from the past 240 hour(s))  URINE CULTURE     Status: Normal   Collection Time   11/06/11  8:22 PM      Component Value Range Status Comment   Specimen Description URINE, CATHETERIZED   Final    Special Requests NONE   Final    Culture  Setup  Time 11/07/2011 02:01   Final    Colony Count NO GROWTH   Final    Culture NO GROWTH   Final    Report Status 11/08/2011 FINAL   Final      Labs: Basic Metabolic Panel:  Lab 11/16/11 1610 11/15/11 0500 11/14/11 0550 11/13/11 0500 11/11/11 0425  NA 134* 135 135 135 138  K 3.6 3.8 3.1* 3.2* 3.4*  CL 102 103 102 104 105  CO2 24 22 24 24 25   GLUCOSE 100* 87 92 101* 98  BUN 5* 5* 7 6 7   CREATININE 0.64  0.61 0.61 0.56 0.59  CALCIUM 8.5 8.3* 7.9* 8.1* 8.1*  MG 1.7 1.4* 1.4* 1.2* 1.5  PHOS -- -- -- -- --   Liver Function Tests:  Lab 11/15/11 0500 11/10/11 0420  AST 62* 56*  ALT 28 24  ALKPHOS 241* 259*  BILITOT 3.7* 4.2*  PROT 5.6* 5.4*  ALBUMIN 2.1* 2.1*   And aCBC:  Lab 11/15/11 0500 11/14/11 0550 11/11/11 0425 11/10/11 0420  WBC 9.1 8.6 8.3 7.6  NEUTROABS -- -- -- --  HGB 9.2* 8.9* 9.1* 8.9*  HCT 27.3* 26.3* 27.3* 25.8*  MCV 93.8 93.3 95.8 94.5  PLT 386 319 259 253   CBG:  Lab 11/11/11 0941  GLUCAP 69*    SignedConley Canal [facility] [phone] 11/16/2011, 11:55 AM

## 2011-11-15 NOTE — Progress Notes (Signed)
Physical Therapy Treatment Patient Details Name: [name] MRN: [id number] DOB: [birth year] Today's Date: 11/15/2011 Time: 1478-2956 PT Time Calculation (min): 25 min  PT Assessment / Plan / Recommendation Comments on Treatment Session  pt presents with Fall, L Humerus fx s/p ORIF, Etoh, and AMS.  pt very difficult to keep on task for good participation in mobility.  pt not retaining any orientation info even 1 min later when asked.  Will need SNF.      Follow Up Recommendations  Post acute inpatient     Does the patient have the potential to tolerate intense rehabilitation  No, Recommend SNF  Barriers to Discharge        Equipment Recommendations  None recommended by OT;None recommended by PT    Recommendations for Other Services    Frequency Min 3X/week   Plan Discharge plan remains appropriate;Frequency needs to be updated    Precautions / Restrictions Precautions Precautions: Fall Required Braces or Orthoses: Other Brace/Splint Other Brace/Splint: L UE sling Restrictions Weight Bearing Restrictions: Yes LUE Weight Bearing: Non weight bearing   Pertinent Vitals/Pain Denies pain.      Mobility  Bed Mobility Bed Mobility: Supine to Sit;Sitting - Scoot to Edge of Bed Supine to Sit: 1: +2 Total assist Supine to Sit: Patient Percentage: 40% Sitting - Scoot to Edge of Bed: 3: Mod assist Sitting - Scoot to Edge of Bed: Patient Percentage: 20% Details for Bed Mobility Assistance: ceus for safe technique, sequencing, attending to task.   Transfers Transfers: Sit to Stand;Stand to Sit;Stand Pivot Transfers Sit to Stand: 1: +2 Total assist;With upper extremity assist;From bed Sit to Stand: Patient Percentage: 20% Stand to Sit: 1: +2 Total assist;With upper extremity assist;To chair/3-in-1;With armrests Stand to Sit: Patient Percentage: 20% Stand Pivot Transfers: 1: +2 Total assist Stand Pivot Transfers: Patient Percentage: 20% Details for Transfer Assistance: cues for  sequencing, safe technique, attending to task.  pt only able to achieve squat for transfer, unable to extend trunk/hips.   Ambulation/Gait Ambulation/Gait Assistance: Not tested (comment) Stairs: No Wheelchair Mobility Wheelchair Mobility: No    Exercises     PT Diagnosis:    PT Problem List:   PT Treatment Interventions:     PT Goals Acute Rehab PT Goals Time For Goal Achievement: 11/17/11 PT Goal: Supine/Side to Sit - Progress: Progressing toward goal PT Goal: Sit to Stand - Progress: Progressing toward goal PT Goal: Stand to Sit - Progress: Progressing toward goal PT Goal: Stand - Progress: Progressing toward goal  Visit Information  Last PT Received On: 11/15/11 Assistance Needed: +2 PT/OT Co-Evaluation/Treatment: Yes    Subjective Data  Subjective: I bet you missed the snow we've had these last couple weeks.     Cognition  Overall Cognitive Status: Impaired Area of Impairment: Attention;Memory;Following commands;Safety/judgement;Awareness of errors;Awareness of deficits;Problem solving Arousal/Alertness: Awake/alert Orientation Level: Disoriented to;Place;Time;Situation Behavior During Session: Restless Current Attention Level: Focused Memory: Decreased recall of precautions Memory Deficits: pt oriented to date, location, and situation, but now recall of any of these ~15mins later.   Following Commands: Follows one step commands inconsistently Safety/Judgement: Decreased safety judgement for tasks assessed;Impulsive;Decreased awareness of need for assistance Safety/Judgement - Other Comments: trying to puah through LUE to stand regardless of constant cuing of NWB status Awareness of Errors: Assistance required to identify errors made;Assistance required to correct errors made Awareness of Errors - Other Comments: poor intelectual awareness Awareness of Deficits: pt unaware of L humerus fx Problem Solving: pt unable to attend to tasks  for problem solving.   Executive  Functioning: poor attention, inhibitory behaviors, poor intiation    Balance  Balance Balance Assessed: Yes Static Sitting Balance Static Sitting - Balance Support: Feet supported Static Sitting - Level of Assistance: 2: Max assist Static Sitting - Comment/# of Minutes: Unable to maintain static sitting without support  End of Session PT - End of Session Equipment Utilized During Treatment: Gait belt Activity Tolerance: Patient tolerated treatment well Patient left: in chair;with call bell/phone within reach;with chair alarm set Nurse Communication: Mobility status;Need for lift equipment   GP     Sunny Schlein, [place] [phone] 11/15/2011, 12:11 PM

## 2011-11-15 NOTE — Progress Notes (Signed)
Clinical Social Work  CSW received call from [facility] (SNF) who reported that when they were meeting with patient and wife at bedside that patient became combative and agitated towards wife. SNF reports that patient tried to leave the room and struggled with redirecting patient. SNF reports they are not pulling their bed offer but would like to postpone dc. SNF to reevaluate patient face-to-face to determine if patient is appropriate. CSW informed wife, MD and RN of plans. CSW will continue to follow.  [place], [place] [phone]

## 2011-11-15 NOTE — Discharge Summary (Signed)
Addendum  Patient seen and examined, chart and data base reviewed.  I agree with the above assessment and discharge plan.  For full details please see Mrs. Algis Downs PA. Note.  Post-operative respiratory failure, severe EtOH withdrawal with DTs, UTI and Lt humeral fracture.  Confusion and disorientation, likely secondary to his alcohol abuse, ? Alcohol dementia.   Clint Lipps, MD [facility] Pager: [phone] 11/15/2011, 12:52 PM

## 2011-11-16 ENCOUNTER — Telehealth: Payer: Self-pay

## 2011-11-16 DIAGNOSIS — S42309A Unspecified fracture of shaft of humerus, unspecified arm, initial encounter for closed fracture: Secondary | ICD-10-CM | POA: Diagnosis not present

## 2011-11-16 DIAGNOSIS — M79609 Pain in unspecified limb: Secondary | ICD-10-CM | POA: Diagnosis not present

## 2011-11-16 LAB — BASIC METABOLIC PANEL
BUN: 5 mg/dL — ABNORMAL LOW (ref 6–23)
CO2: 24 mEq/L (ref 19–32)
Calcium: 8.5 mg/dL (ref 8.4–10.5)
Glucose, Bld: 100 mg/dL — ABNORMAL HIGH (ref 70–99)
Sodium: 134 mEq/L — ABNORMAL LOW (ref 135–145)

## 2011-11-16 NOTE — Progress Notes (Signed)
Physical Therapy Treatment Patient Details Name: [name] MRN: [id number] DOB: [birth year] Today's Date: 11/16/2011 Time: 5621-3086 PT Time Calculation (min): 26 min  PT Assessment / Plan / Recommendation Comments on Treatment Session  Pt s/p fall with lt humerus fx requiring ORIF.  Pt with ETOH abuse and AMS.  Better progress with mobility today.    Follow Up Recommendations  Post acute inpatient     Does the patient have the potential to tolerate intense rehabilitation  No, Recommend SNF  Barriers to Discharge        Equipment Recommendations  None recommended by PT    Recommendations for Other Services    Frequency Min 3X/week   Plan Discharge plan remains appropriate;Frequency remains appropriate    Precautions / Restrictions Precautions Precautions: Fall Required Braces or Orthoses: Other Brace/Splint Other Brace/Splint: lt arm sling Restrictions Weight Bearing Restrictions: Yes LUE Weight Bearing: Non weight bearing   Pertinent Vitals/Pain Grimace with movement of lt arm.    Mobility  Bed Mobility Supine to Sit: 1: +2 Total assist Supine to Sit: Patient Percentage: 50% Sitting - Scoot to Edge of Bed: 3: Mod assist Sit to Supine: 1: +2 Total assist Sit to Supine: Patient Percentage: 60% Details for Bed Mobility Assistance: Verbal/tactile cues for technique and attending to task Transfers Sit to Stand: 1: +2 Total assist;With upper extremity assist;From bed Sit to Stand: Patient Percentage: 50% Stand to Sit: 1: +2 Total assist;With upper extremity assist;To bed Stand to Sit: Patient Percentage: 50% Transfer via Lift Equipment: Stedy Details for Transfer Assistance: Used Stedy so pt could reach forward with rt arm which helped pt bring weight anteriorly.  Assist to bring hips up and cues to stand more erect.  Pt able to reach full standing  on Stedy.  Performed x 4.    Exercises     PT Diagnosis:    PT Problem List:   PT Treatment Interventions:     PT  Goals Acute Rehab PT Goals PT Goal: Supine/Side to Sit - Progress: Progressing toward goal PT Goal: Sit to Supine/Side - Progress: Progressing toward goal PT Goal: Sit to Stand - Progress: Progressing toward goal PT Goal: Stand to Sit - Progress: Progressing toward goal PT Goal: Stand - Progress: Progressing toward goal  Visit Information  Last PT Received On: 11/16/11 Assistance Needed: +2    Subjective Data  Subjective: "I hate using other peoples tools," pt responded when asked if he was in pain.   Cognition  Overall Cognitive Status: Impaired Area of Impairment: Attention;Memory;Following commands;Safety/judgement;Awareness of errors;Awareness of deficits;Problem solving Arousal/Alertness: Awake/alert Orientation Level: Disoriented to;Place;Time;Situation Behavior During Session: [facility] for tasks performed Current Attention Level: Focused Memory: Decreased recall of precautions Memory Deficits: Constant reminders not to use lt arm Following Commands: Follows one step commands inconsistently Safety/Judgement: Decreased awareness of safety precautions;Decreased safety judgement for tasks assessed;Impulsive;Decreased awareness of need for assistance Awareness of Errors: Assistance required to identify errors made;Assistance required to correct errors made Problem Solving: Pt with difficulty processing how to brush teeth.    Balance  Static Sitting Balance Static Sitting - Balance Support: Right upper extremity supported;Feet supported Static Sitting - Level of Assistance: 4: Min assist Static Sitting - Comment/# of Minutes: cues to correct posterior lean and keep feet on the floor.  Pt able to sit brief periods (10-20 sec) with close supervision and cues. Static Standing Balance Static Standing - Balance Support: Right upper extremity supported Static Standing - Level of Assistance: 1: +2 Total assist (pt =  60%) Static Standing - Comment/# of Minutes: Stood 4 times for 1-2 minutes  working on fully extending hips and trunk.  End of Session PT - End of Session Equipment Utilized During Treatment: Gait belt Activity Tolerance: Patient tolerated treatment well Patient left: in bed;with call bell/phone within reach;with bed alarm set Nurse Communication: Mobility status;Need for lift equipment   GP     Wiletta Bermingham 11/16/2011, 10:57 AM  [facility] PT [phone]

## 2011-11-16 NOTE — Progress Notes (Signed)
11/16/11 1600  Clinical Encounter Type  Visited With Patient  Visit Type Follow-up   I visited patient. The nurse said he was being released today. Veryl Speak

## 2011-11-16 NOTE — Progress Notes (Signed)
Clinical Social Work  CSW spoke with wife this morning regarding patient's dc plans. Wife reports that she feels patient needs to stay in the hospital until he is not confused and has rebuilt his strength. CSW and wife spoke about SNF setting and benefits of therapy to assist with strength. Wife requests to speak with PA or MD regarding status. CSW informed PA of wife's questions.  CSW called and spoke with [facility]. SNF agreeable to come and evaluate patient this morning to determine if he is appropriate for their facility. CSW will await for [facility] decision and will discuss other options if wife if [facility] unable to accept patient.   CSW updated RN on plans and will continue to follow.  [place], [place] [phone]

## 2011-11-16 NOTE — Telephone Encounter (Signed)
pts wife request call back from Dr Dayton Martes; has information to share about pt. Left v/m for pt to call back.

## 2011-11-16 NOTE — Discharge Summary (Signed)
Addendum  Patient seen and examined, chart and data base reviewed.  I agree with the above assessment and discharge plan.  For full details please see Mrs. Marianne York PA. Note.  Post-operative respiratory failure, severe EtOH withdrawal with DTs, UTI and Lt humeral fracture.   Confusion and disorientation, likely secondary to his alcohol abuse, ? Alcohol dementia.    Givanni Staron A Kenyatta Gloeckner, MD [facility] Pager: [phone] 11/16/2011, 12:45 PM   

## 2011-11-16 NOTE — Progress Notes (Signed)
Clinical Social Work  CSW spoke with [employer] who evaluated patient and agreeable to admission today. CSW faxed dc summary to SNF and SNF agreeable to admission. CSW prepared dc packet with DNR form, FL2 and hard scripts. CSW informed patient, wife and RN of dc and all parties agreeable. CSW coordinated transportation via [place]. CSW is signing off but available if needed.  [place], [place] [phone]

## 2011-11-16 NOTE — Discharge Summary (Signed)
Addendum  Patient seen and examined, chart and data base reviewed.  I agree with the above assessment and discharge plan.  For full details please see Mrs. Algis Downs PA. Note.  Post-operative respiratory failure, severe EtOH withdrawal with DTs, UTI and Lt humeral fracture.   Confusion and disorientation, likely secondary to his alcohol abuse, ? Alcohol dementia.    Clint Lipps, MD [facility] Pager: [phone] 11/16/2011, 12:45 PM

## 2011-11-16 NOTE — Telephone Encounter (Signed)
Pts wife, [name] wants to speak with Dr Dayton Martes only about pt; she states pt is out of touch. Pt is presently at [facility]. [name] said no emergency just feels better to speak with Dr Dayton Martes.

## 2011-11-17 ENCOUNTER — Other Ambulatory Visit: Payer: Self-pay

## 2011-11-17 ENCOUNTER — Emergency Department (HOSPITAL_COMMUNITY)
Admission: EM | Admit: 2011-11-17 | Discharge: 2011-11-17 | Disposition: A | Discharge status: Home / Self Care | Payer: Medicare Other | Attending: Emergency Medicine | Admitting: Emergency Medicine

## 2011-11-17 DIAGNOSIS — Z9181 History of falling: Secondary | ICD-10-CM | POA: Insufficient documentation

## 2011-11-17 DIAGNOSIS — I739 Peripheral vascular disease, unspecified: Secondary | ICD-10-CM | POA: Insufficient documentation

## 2011-11-17 DIAGNOSIS — I10 Essential (primary) hypertension: Secondary | ICD-10-CM | POA: Insufficient documentation

## 2011-11-17 DIAGNOSIS — Z860100 Personal history of colon polyps, unspecified: Secondary | ICD-10-CM | POA: Insufficient documentation

## 2011-11-17 DIAGNOSIS — E785 Hyperlipidemia, unspecified: Secondary | ICD-10-CM | POA: Insufficient documentation

## 2011-11-17 DIAGNOSIS — F0390 Unspecified dementia without behavioral disturbance: Secondary | ICD-10-CM | POA: Diagnosis not present

## 2011-11-17 DIAGNOSIS — N400 Enlarged prostate without lower urinary tract symptoms: Secondary | ICD-10-CM | POA: Insufficient documentation

## 2011-11-17 DIAGNOSIS — Z79899 Other long term (current) drug therapy: Secondary | ICD-10-CM | POA: Insufficient documentation

## 2011-11-17 DIAGNOSIS — Z8781 Personal history of (healed) traumatic fracture: Secondary | ICD-10-CM | POA: Insufficient documentation

## 2011-11-17 DIAGNOSIS — Z8719 Personal history of other diseases of the digestive system: Secondary | ICD-10-CM | POA: Insufficient documentation

## 2011-11-17 DIAGNOSIS — Z862 Personal history of diseases of the blood and blood-forming organs and certain disorders involving the immune mechanism: Secondary | ICD-10-CM | POA: Insufficient documentation

## 2011-11-17 DIAGNOSIS — F341 Dysthymic disorder: Secondary | ICD-10-CM | POA: Insufficient documentation

## 2011-11-17 DIAGNOSIS — H9190 Unspecified hearing loss, unspecified ear: Secondary | ICD-10-CM | POA: Insufficient documentation

## 2011-11-17 DIAGNOSIS — F1021 Alcohol dependence, in remission: Secondary | ICD-10-CM | POA: Insufficient documentation

## 2011-11-17 DIAGNOSIS — J42 Unspecified chronic bronchitis: Secondary | ICD-10-CM | POA: Insufficient documentation

## 2011-11-17 DIAGNOSIS — F411 Generalized anxiety disorder: Secondary | ICD-10-CM | POA: Diagnosis not present

## 2011-11-17 DIAGNOSIS — F29 Unspecified psychosis not due to a substance or known physiological condition: Secondary | ICD-10-CM | POA: Diagnosis not present

## 2011-11-17 DIAGNOSIS — Z8701 Personal history of pneumonia (recurrent): Secondary | ICD-10-CM | POA: Insufficient documentation

## 2011-11-17 DIAGNOSIS — F1027 Alcohol dependence with alcohol-induced persisting dementia: Secondary | ICD-10-CM | POA: Insufficient documentation

## 2011-11-17 DIAGNOSIS — Z8659 Personal history of other mental and behavioral disorders: Secondary | ICD-10-CM | POA: Insufficient documentation

## 2011-11-17 DIAGNOSIS — K219 Gastro-esophageal reflux disease without esophagitis: Secondary | ICD-10-CM | POA: Insufficient documentation

## 2011-11-17 DIAGNOSIS — N390 Urinary tract infection, site not specified: Secondary | ICD-10-CM | POA: Insufficient documentation

## 2011-11-17 LAB — URINE MICROSCOPIC-ADD ON

## 2011-11-17 LAB — COMPREHENSIVE METABOLIC PANEL
ALT: 33 U/L (ref 0–53)
Albumin: 2.4 g/dL — ABNORMAL LOW (ref 3.5–5.2)
Alkaline Phosphatase: 265 U/L — ABNORMAL HIGH (ref 39–117)
BUN: 9 mg/dL (ref 6–23)
Potassium: 3.7 mEq/L (ref 3.5–5.1)
Sodium: 133 mEq/L — ABNORMAL LOW (ref 135–145)
Total Protein: 6.3 g/dL (ref 6.0–8.3)

## 2011-11-17 LAB — CBC WITH DIFFERENTIAL/PLATELET
Basophils Relative: 1 % (ref 0–1)
Eosinophils Absolute: 0.2 10*3/uL (ref 0.0–0.7)
MCH: 32 pg (ref 26.0–34.0)
MCHC: 34.2 g/dL (ref 30.0–36.0)
Neutrophils Relative %: 70 % (ref 43–77)
Platelets: 534 10*3/uL — ABNORMAL HIGH (ref 150–400)

## 2011-11-17 LAB — URINALYSIS, ROUTINE W REFLEX MICROSCOPIC
Glucose, UA: NEGATIVE mg/dL
Ketones, ur: 15 mg/dL — AB
Protein, ur: NEGATIVE mg/dL

## 2011-11-17 LAB — RAPID URINE DRUG SCREEN, HOSP PERFORMED
Amphetamines: NOT DETECTED
Benzodiazepines: POSITIVE — AB
Cocaine: NOT DETECTED

## 2011-11-17 LAB — ETHANOL: Alcohol, Ethyl (B): <11 mg/dL (ref 0–11)

## 2011-11-17 MED ORDER — CIPROFLOXACIN HCL 500 MG PO TABS: 500.0000 mg | ORAL_TABLET | Freq: Two times a day (BID) | ORAL | Status: DC

## 2011-11-17 NOTE — ED Provider Notes (Signed)
History     CSN: [id number]  Arrival date & time 11/17/11  1345   First MD Initiated Contact with Patient 11/17/11 1408      Chief Complaint  Patient presents with  . Delirium     (Consider location/radiation/quality/duration/timing/severity/associated sxs/prior treatment) HPI Comments: Patient presents today from [facility] due to concern that the patient would develop DT's.  Patient presents with paperwork from facility confirming that they were concerned that patient would develop alcohol induced seizures or DT's.  Patient was discharged from [facility] to [facility] yesterday.  He was in the hospital from 11/02/11 up until yesterday after he was found to have some lab abnormalities and a humeral head fracture while in the ED on 11/02/11.    During the time of his hospitalization he did not have an alcohol.  Patient does have a history of Alcoholism, but patient's wife also confirmed with the RN that the patient's last drink was the day before his admission.   Patient was given Neurontin and Ativan at the facility earlier today.    The history is provided by medical records.    Past Medical History  Diagnosis Date  . HLD (hyperlipidemia) 5/99  . HTN (hypertension) 5/99  . BPH (benign prostatic hypertrophy) 5/99  . GERD (gastroesophageal reflux disease) 07/31/04    gastritis   . Anxiety   . Allergy     pollen/ragweed  . Adenomatous polyp of colon 2006  . Hearing difficulty   . Depression   . Humerus fracture 11/02/2011    LUE  . Peripheral vascular disease   . Pneumonia 1996    hosp  . Chronic bronchitis     "q year or q other year" (11/02/2011)  . History of blood transfusion 2012  . Anemia 2012  . Hernia, umbilical     "unrepaired" (11/02/2011)  . H/O hiatal hernia   . Memory loss     "recently has been forgetting alot; maybe related to the alcohol" (11/02/2011)  . Falls frequently     "daily lately; legs just give out" (11/02/2011)    Past  Surgical History  Procedure Date  . L duputryen contractive surgery   . Neck mri 6/04    C5-6 disc abnormality  . Colonoscopy 07/31/04    multiple polyps; repeat in 3 years  . Hosp cp r/o'd 2/24 03/08/07  . Ett myoview 03/09/07    nml EF 66%  . Upper gastrointestinal endoscopy   . Cataract extraction w/ intraocular lens  implant, bilateral 10/2002  . Tonsillectomy     "I was young" (11/02/2011)  . Hemorrhoid surgery     "cauterized a long time ago" (11/02/2011)  . Esophageal varice ligation 2012  . Orif humerus fracture 11/04/2011    Procedure: OPEN REDUCTION INTERNAL FIXATION (ORIF) PROXIMAL HUMERUS FRACTURE;  Surgeon: Budd Palmer, MD;  Location: [facility] OR;  Service: Orthopedics;  Laterality: Left;    Family History  Problem Relation Age of Onset  . Heart disease Father   . Diabetes Father   . Stroke Father   . Depression Mother   . Heart disease Brother   . Alcohol abuse Brother   . Liver disease Brother     History  Substance Use Topics  . Smoking status: Never Smoker   . Smokeless tobacco: Never Used  . Alcohol Use: 540.0 oz/week    900 Glasses of wine per week     Comment: 11/02/2011 "large box of wine q 2 days"  Review of Systems  Unable to perform ROS: Dementia    Allergies  Nifedipine  Home Medications   Current Outpatient Rx  Name  Route  Sig  Dispense  Refill  . ALBUTEROL SULFATE (5 MG/ML) 0.5% IN NEBU   Nebulization   Take 0.5 mLs (2.5 mg total) by nebulization every 2 (two) hours as needed for wheezing or shortness of breath.   20 mL   0   . B COMPLEX VITAMINS PO CAPS   Oral   Take 1 capsule by mouth daily.          [name] ENSURE PUDDING PO PUDG   Oral   Take 1 Container by mouth 3 (three) times daily between meals.         [name] FEXOFENADINE HCL 180 MG PO TABS   Oral   Take 180 mg by mouth daily as needed. For allergies         . FLUTICASONE PROPIONATE 50 MCG/ACT NA SUSP   Nasal   Place 2 sprays into the nose as needed. For  allergies         . FOLIC ACID 1 MG PO TABS   Oral   Take 1 tablet (1 mg total) by mouth daily.         [name] GABAPENTIN 300 MG PO CAPS   Oral   Take 300 mg by mouth 3 (three) times daily.         [name] HYDROCODONE-ACETAMINOPHEN 5-325 MG PO TABS   Oral   Take 1 tablet by mouth every 6 (six) hours as needed. For pain.         . IBUPROFEN 200 MG PO TABS   Oral   Take 200 mg by mouth every 6 (six) hours as needed. For pain         . LORAZEPAM 0.5 MG PO TABS   Oral   Take 0.5 mg by mouth every 2 (two) hours as needed. For agitation.         [name] MAGNESIUM CHLORIDE 64 MG PO TBEC   Oral   Take 1 tablet (64 mg total) by mouth daily.   60 tablet      . ADULT MULTIVITAMIN W/MINERALS CH   Oral   Take 1 tablet by mouth daily.         [name] PANTOPRAZOLE SODIUM 40 MG PO TBEC   Oral   Take 40 mg by mouth 2 (two) times daily.         [name] PROMETHAZINE HCL 25 MG PO TABS   Oral   Take 25 mg by mouth every 6 (six) hours as needed. For nausea.         [name] QUETIAPINE FUMARATE 200 MG PO TABS   Oral   Take 1 tablet (200 mg total) by mouth at bedtime.   30 tablet   0   . SPIRONOLACTONE 25 MG PO TABS   Oral   Take 1 tablet (25 mg total) by mouth daily.   30 tablet   11   . TERAZOSIN HCL 2 MG PO CAPS   Oral   Take 1 capsule (2 mg total) by mouth at bedtime.   30 capsule   6   . VITAMIN B-1 100 MG PO TABS   Oral   Take 100 mg by mouth daily.            BP 108/73  Pulse 90  Temp 98 F (36.7 C) (Oral)  Resp 19  SpO2 96%  Physical Exam  Nursing note and vitals reviewed. Constitutional: He appears well-developed and well-nourished.  HENT:  Head: Normocephalic and atraumatic.  Mouth/Throat: Oropharynx is clear and moist.  Eyes: EOM are normal. Pupils are equal, round, and reactive to light.  Neck: Normal range of motion. Neck supple.  Cardiovascular: Normal rate, regular rhythm and normal heart sounds.   Pulses:      Radial pulses are 2+ on the right side, and 2+ on  the left side.  Pulmonary/Chest: Effort normal and breath sounds normal. No respiratory distress. He has no wheezes.  Musculoskeletal:       Left arm in sling  Neurological: He is alert.       Patient able to tell me his name, his date of birth, and where he is.  Patient unable to tell me the President of the [place].  Patient unable to cooperate with full neurological exam.  He was able to close his eyes on command and stick out his tongue on command.  Skin: Skin is warm and dry.    ED Course  Procedures (including critical care time)   Labs Reviewed  CBC WITH DIFFERENTIAL  COMPREHENSIVE METABOLIC PANEL  ETHANOL  URINE RAPID DRUG SCREEN (HOSP PERFORMED)  URINALYSIS, ROUTINE W REFLEX MICROSCOPIC   No results found.   No diagnosis found.  Patient discussed with Dr. Estell Harpin.    Consulted Unk Lightning who was patient's SW that was involved in placement at [facility].  She reports that delerium is not new.  This was present during his entire hospitalization.  She also reports that he was not given Alcohol during his admission.  This was confirmed with Algis Downs, PA-C with [facility] who was involved in patient's care during his admission.   MDM  Patient presenting from [facility] due to concern that the patient would develop DT's.  Patient was discharged from the hospital to [facility] yesterday evening.  Patient was in the hospital at [facility] from 11/02/11 up until 11/5.  During that entire hospitalization he was not given Alcohol.  This was confirmed with his SW during that hospitalization.  Therefore, patient would have already gone through detox during this hospitalization.  ETOH level negative in the ED today.  VSS.  Therefore, feel that patient can be discharged back to [facility].  Dr. Estell Harpin in agreement with the plan.  UA did show evidence of UTI.  Patient discharged with prescription for Cipro.        Pascal Lux Bowring, PA-C 11/17/11 2221

## 2011-11-17 NOTE — ED Notes (Signed)
YNW:[id number]> Expected date:<BR> Expected time:<BR> Means of arrival:<BR> Comments:<BR> 61yoM Confusion, delirium, etoh withdrawal

## 2011-11-17 NOTE — ED Notes (Signed)
Herbert Seta, PA spoke with [facility].  Pt is going back to them.

## 2011-11-17 NOTE — ED Notes (Signed)
[facility] called  

## 2011-11-17 NOTE — ED Notes (Signed)
Per EMS: Pt coming from [facility].  Significant ETOH hx.  Facility states he is having acute delirium.  However paperwork shows that he has been having delirium for a month since he broke his arm.  Pt's wife states that he has been having confusion spells for over a year.  The admission note to [facility] states pt was only oriented to self and that pt has episodes of slurred speech.  Admission note was from last night around 10 pm.  Since, pt has been noted to crawl around on the floor naked, worsening confusion.  Pt is currently only oriented to self.  Thinks [place] is president.  Ativan makes his delirium worse (note from hospital).  Instead d/c pt from hospital on seroquel.  Facility gave pt 0.5 of ativan and 300 of neurontin at 1235 today.  He had been given alcohol mixed with coke while in the hospital to avoid DTs.  Pt is DNR.

## 2011-11-18 NOTE — Telephone Encounter (Signed)
Called pt's wife and left voicemail to return my call.

## 2011-11-18 NOTE — Telephone Encounter (Signed)
pts wife left v/m she can be reached at [phone] or [phone].

## 2011-11-19 LAB — URINE CULTURE: Colony Count: NO GROWTH

## 2011-11-19 NOTE — Telephone Encounter (Signed)
Called patient's wife, [name] and discussed her husband's condition with her.  He is now at [facility], remains delirious- a little more lucid today.

## 2011-11-23 NOTE — ED Provider Notes (Signed)
Medical screening examination/treatment/procedure(s) were performed by non-physician practitioner and as supervising physician I was immediately available for consultation/collaboration.   Benny Lennert, MD 11/23/11 [phone]

## 2011-11-24 DIAGNOSIS — S42213A Unspecified displaced fracture of surgical neck of unspecified humerus, initial encounter for closed fracture: Secondary | ICD-10-CM | POA: Diagnosis not present

## 2011-11-29 DIAGNOSIS — R404 Transient alteration of awareness: Secondary | ICD-10-CM | POA: Diagnosis not present

## 2011-11-29 DIAGNOSIS — S42209A Unspecified fracture of upper end of unspecified humerus, initial encounter for closed fracture: Secondary | ICD-10-CM | POA: Diagnosis not present

## 2011-11-29 DIAGNOSIS — M6281 Muscle weakness (generalized): Secondary | ICD-10-CM | POA: Diagnosis not present

## 2011-11-29 DIAGNOSIS — R609 Edema, unspecified: Secondary | ICD-10-CM | POA: Diagnosis not present

## 2011-11-29 DIAGNOSIS — E878 Other disorders of electrolyte and fluid balance, not elsewhere classified: Secondary | ICD-10-CM | POA: Diagnosis not present

## 2011-12-01 ENCOUNTER — Ambulatory Visit (HOSPITAL_COMMUNITY)
Admission: RE | Admit: 2011-12-01 | Discharge: 2011-12-01 | Disposition: A | Discharge status: Home / Self Care | Payer: Medicare Other | Source: Ambulatory Visit | Attending: Internal Medicine | Admitting: Internal Medicine

## 2011-12-01 ENCOUNTER — Other Ambulatory Visit (HOSPITAL_COMMUNITY): Payer: Self-pay | Admitting: Internal Medicine

## 2011-12-01 DIAGNOSIS — R220 Localized swelling, mass and lump, head: Secondary | ICD-10-CM | POA: Insufficient documentation

## 2011-12-01 DIAGNOSIS — R410 Disorientation, unspecified: Secondary | ICD-10-CM

## 2011-12-01 DIAGNOSIS — S0990XA Unspecified injury of head, initial encounter: Secondary | ICD-10-CM | POA: Diagnosis not present

## 2011-12-01 DIAGNOSIS — X58XXXA Exposure to other specified factors, initial encounter: Secondary | ICD-10-CM | POA: Insufficient documentation

## 2011-12-01 DIAGNOSIS — M542 Cervicalgia: Secondary | ICD-10-CM | POA: Diagnosis not present

## 2011-12-01 DIAGNOSIS — S199XXA Unspecified injury of neck, initial encounter: Secondary | ICD-10-CM | POA: Diagnosis not present

## 2011-12-01 DIAGNOSIS — S129XXA Fracture of neck, unspecified, initial encounter: Secondary | ICD-10-CM | POA: Diagnosis not present

## 2011-12-01 DIAGNOSIS — S12100A Unspecified displaced fracture of second cervical vertebra, initial encounter for closed fracture: Secondary | ICD-10-CM | POA: Diagnosis not present

## 2011-12-01 DIAGNOSIS — F29 Unspecified psychosis not due to a substance or known physiological condition: Secondary | ICD-10-CM | POA: Diagnosis not present

## 2011-12-01 DIAGNOSIS — R221 Localized swelling, mass and lump, neck: Secondary | ICD-10-CM | POA: Insufficient documentation

## 2011-12-01 DIAGNOSIS — T1490XA Injury, unspecified, initial encounter: Secondary | ICD-10-CM

## 2011-12-02 DIAGNOSIS — F29 Unspecified psychosis not due to a substance or known physiological condition: Secondary | ICD-10-CM | POA: Diagnosis not present

## 2011-12-02 DIAGNOSIS — F411 Generalized anxiety disorder: Secondary | ICD-10-CM | POA: Diagnosis not present

## 2011-12-08 DIAGNOSIS — S129XXA Fracture of neck, unspecified, initial encounter: Secondary | ICD-10-CM | POA: Diagnosis not present

## 2011-12-14 DIAGNOSIS — F29 Unspecified psychosis not due to a substance or known physiological condition: Secondary | ICD-10-CM | POA: Diagnosis not present

## 2011-12-14 DIAGNOSIS — F411 Generalized anxiety disorder: Secondary | ICD-10-CM | POA: Diagnosis not present

## 2011-12-15 DIAGNOSIS — S42213A Unspecified displaced fracture of surgical neck of unspecified humerus, initial encounter for closed fracture: Secondary | ICD-10-CM | POA: Diagnosis not present

## 2011-12-28 DIAGNOSIS — F411 Generalized anxiety disorder: Secondary | ICD-10-CM | POA: Diagnosis not present

## 2011-12-28 DIAGNOSIS — F29 Unspecified psychosis not due to a substance or known physiological condition: Secondary | ICD-10-CM | POA: Diagnosis not present

## 2011-12-30 DIAGNOSIS — M6281 Muscle weakness (generalized): Secondary | ICD-10-CM | POA: Diagnosis not present

## 2011-12-30 DIAGNOSIS — F329 Major depressive disorder, single episode, unspecified: Secondary | ICD-10-CM | POA: Diagnosis not present

## 2011-12-30 DIAGNOSIS — J301 Allergic rhinitis due to pollen: Secondary | ICD-10-CM | POA: Diagnosis not present

## 2011-12-30 DIAGNOSIS — S42209A Unspecified fracture of upper end of unspecified humerus, initial encounter for closed fracture: Secondary | ICD-10-CM | POA: Diagnosis not present

## 2011-12-30 DIAGNOSIS — M542 Cervicalgia: Secondary | ICD-10-CM | POA: Diagnosis not present

## 2012-01-10 DIAGNOSIS — S129XXA Fracture of neck, unspecified, initial encounter: Secondary | ICD-10-CM | POA: Diagnosis not present

## 2012-01-10 DIAGNOSIS — S12000A Unspecified displaced fracture of first cervical vertebra, initial encounter for closed fracture: Secondary | ICD-10-CM | POA: Diagnosis not present

## 2012-01-13 DIAGNOSIS — F29 Unspecified psychosis not due to a substance or known physiological condition: Secondary | ICD-10-CM | POA: Diagnosis not present

## 2012-01-13 DIAGNOSIS — F411 Generalized anxiety disorder: Secondary | ICD-10-CM | POA: Diagnosis not present

## 2012-01-13 DIAGNOSIS — F0390 Unspecified dementia without behavioral disturbance: Secondary | ICD-10-CM | POA: Diagnosis not present

## 2012-01-19 DIAGNOSIS — S42213A Unspecified displaced fracture of surgical neck of unspecified humerus, initial encounter for closed fracture: Secondary | ICD-10-CM | POA: Diagnosis not present

## 2012-01-26 DIAGNOSIS — F0390 Unspecified dementia without behavioral disturbance: Secondary | ICD-10-CM | POA: Diagnosis not present

## 2012-01-26 DIAGNOSIS — F29 Unspecified psychosis not due to a substance or known physiological condition: Secondary | ICD-10-CM | POA: Diagnosis not present

## 2012-01-26 DIAGNOSIS — F411 Generalized anxiety disorder: Secondary | ICD-10-CM | POA: Diagnosis not present

## 2012-01-31 ENCOUNTER — Telehealth: Payer: Self-pay | Admitting: Family Medicine

## 2012-01-31 DIAGNOSIS — M25519 Pain in unspecified shoulder: Secondary | ICD-10-CM | POA: Diagnosis not present

## 2012-01-31 DIAGNOSIS — M542 Cervicalgia: Secondary | ICD-10-CM | POA: Diagnosis not present

## 2012-01-31 DIAGNOSIS — M25539 Pain in unspecified wrist: Secondary | ICD-10-CM | POA: Diagnosis not present

## 2012-01-31 DIAGNOSIS — M25529 Pain in unspecified elbow: Secondary | ICD-10-CM | POA: Diagnosis not present

## 2012-01-31 NOTE — Telephone Encounter (Signed)
Pt's wife request call back re: update on how pt is doing. Mrs Gover said needs to speak with Dr Dayton Martes and did not leave any further message.

## 2012-02-01 NOTE — Telephone Encounter (Signed)
Left voicemail for pt to return my call 

## 2012-02-02 DIAGNOSIS — S42213A Unspecified displaced fracture of surgical neck of unspecified humerus, initial encounter for closed fracture: Secondary | ICD-10-CM | POA: Diagnosis not present

## 2012-02-07 ENCOUNTER — Other Ambulatory Visit: Payer: Self-pay | Admitting: Neurosurgery

## 2012-02-07 DIAGNOSIS — M4842XA Fatigue fracture of vertebra, cervical region, initial encounter for fracture: Secondary | ICD-10-CM

## 2012-02-07 DIAGNOSIS — S129XXA Fracture of neck, unspecified, initial encounter: Secondary | ICD-10-CM | POA: Diagnosis not present

## 2012-02-14 DIAGNOSIS — F29 Unspecified psychosis not due to a substance or known physiological condition: Secondary | ICD-10-CM | POA: Diagnosis not present

## 2012-02-14 DIAGNOSIS — F0390 Unspecified dementia without behavioral disturbance: Secondary | ICD-10-CM | POA: Diagnosis not present

## 2012-02-14 DIAGNOSIS — F411 Generalized anxiety disorder: Secondary | ICD-10-CM | POA: Diagnosis not present

## 2012-02-21 ENCOUNTER — Ambulatory Visit
Admission: RE | Admit: 2012-02-21 | Discharge: 2012-02-21 | Disposition: A | Discharge status: Home / Self Care | Payer: Medicare Other | Source: Ambulatory Visit | Attending: Neurosurgery | Admitting: Neurosurgery

## 2012-02-21 DIAGNOSIS — M4842XA Fatigue fracture of vertebra, cervical region, initial encounter for fracture: Secondary | ICD-10-CM

## 2012-02-21 DIAGNOSIS — S12000A Unspecified displaced fracture of first cervical vertebra, initial encounter for closed fracture: Secondary | ICD-10-CM | POA: Diagnosis not present

## 2012-02-22 ENCOUNTER — Other Ambulatory Visit: Payer: Medicare Other

## 2012-02-23 DIAGNOSIS — S129XXA Fracture of neck, unspecified, initial encounter: Secondary | ICD-10-CM | POA: Diagnosis not present

## 2012-02-24 DIAGNOSIS — N400 Enlarged prostate without lower urinary tract symptoms: Secondary | ICD-10-CM | POA: Diagnosis not present

## 2012-02-24 DIAGNOSIS — G8921 Chronic pain due to trauma: Secondary | ICD-10-CM | POA: Diagnosis not present

## 2012-02-24 DIAGNOSIS — G609 Hereditary and idiopathic neuropathy, unspecified: Secondary | ICD-10-CM | POA: Diagnosis not present

## 2012-02-24 DIAGNOSIS — R609 Edema, unspecified: Secondary | ICD-10-CM | POA: Diagnosis not present

## 2012-02-24 DIAGNOSIS — S12100A Unspecified displaced fracture of second cervical vertebra, initial encounter for closed fracture: Secondary | ICD-10-CM | POA: Diagnosis not present

## 2012-02-24 DIAGNOSIS — M62838 Other muscle spasm: Secondary | ICD-10-CM | POA: Diagnosis not present

## 2012-02-29 ENCOUNTER — Encounter: Payer: Self-pay | Admitting: Family Medicine

## 2012-02-29 ENCOUNTER — Ambulatory Visit (INDEPENDENT_AMBULATORY_CARE_PROVIDER_SITE_OTHER): Payer: Medicare Other | Admitting: Family Medicine

## 2012-02-29 VITALS — BP 132/82 | HR 100 | Temp 98.0°F | Wt 193.0 lb

## 2012-02-29 DIAGNOSIS — N62 Hypertrophy of breast: Secondary | ICD-10-CM

## 2012-02-29 DIAGNOSIS — Z5181 Encounter for therapeutic drug level monitoring: Secondary | ICD-10-CM | POA: Diagnosis not present

## 2012-02-29 DIAGNOSIS — K7030 Alcoholic cirrhosis of liver without ascites: Secondary | ICD-10-CM

## 2012-02-29 DIAGNOSIS — F1010 Alcohol abuse, uncomplicated: Secondary | ICD-10-CM

## 2012-02-29 DIAGNOSIS — S12100A Unspecified displaced fracture of second cervical vertebra, initial encounter for closed fracture: Secondary | ICD-10-CM

## 2012-02-29 DIAGNOSIS — E1059 Type 1 diabetes mellitus with other circulatory complications: Secondary | ICD-10-CM

## 2012-02-29 LAB — PROLACTIN: Prolactin: 10.9 ng/mL (ref 2.1–17.1)

## 2012-02-29 LAB — COMPREHENSIVE METABOLIC PANEL
Alkaline Phosphatase: 53 U/L (ref 39–117)
BUN: 12 mg/dL (ref 6–23)
CO2: 28 mEq/L (ref 19–32)
Creatinine, Ser: 1.1 mg/dL (ref 0.4–1.5)
GFR: 71.28 mL/min (ref >60.00)
Glucose, Bld: 108 mg/dL — ABNORMAL HIGH (ref 70–99)
Total Bilirubin: 0.7 mg/dL (ref 0.3–1.2)

## 2012-02-29 LAB — CBC WITH DIFFERENTIAL/PLATELET
Basophils Relative: 1 % (ref 0.0–3.0)
Eosinophils Relative: 1.9 % (ref 0.0–5.0)
HCT: 33.2 % — ABNORMAL LOW (ref 39.0–52.0)
Lymphs Abs: 1.1 10*3/uL (ref 0.7–4.0)
MCV: 80.8 fl (ref 78.0–100.0)
Monocytes Absolute: 0.7 10*3/uL (ref 0.1–1.0)
Monocytes Relative: 11.4 % (ref 3.0–12.0)
RBC: 4.11 Mil/uL — ABNORMAL LOW (ref 4.22–5.81)
WBC: 6.2 10*3/uL (ref 4.5–10.5)

## 2012-02-29 LAB — TSH: TSH: 0.97 u[IU]/mL (ref 0.35–5.50)

## 2012-02-29 MED ORDER — SPIRONOLACTONE 50 MG PO TABS: 50.0000 mg | ORAL_TABLET | Freq: Every day | ORAL | Status: DC

## 2012-02-29 NOTE — Progress Notes (Signed)
67 year old pleasant male well known to me with h/o alcoholic liver disease and chronic hyponatremia here with his wife, [name], for SNF follow up.  Fractured his left humerus on 11/02/2011 after he had a fall at home, he admits to be intoxicated at the time.  Required ORIF and was sent to [facility] place for rehab.  He unfortunately had a very complicated course there and was just discharged on 2/14!  On 11/17, he was found on the floor at [facility].  Mr. Markuson does not remember what happened.  3 days later, due to AMS, was sent to [facility] ER.  Head CT on 11/20 (reviewed in Epic) showed a non displaced type 2 odontoid fracture and a non displaced/incomplete fracture of C3.    Referred to neurosurgeon, Dr. Gerlene Fee, who placed in a a hard cervical collar for 4 weeks.  Follow up xrays in his office according to his wife showed good aligment, so cervical collar was continued.  CT of cervical spine was performed on 2/10/214 (reviewed in Epic), which showed no healing of C2 fracture.  Neurosurg is recommending surgery.  He has appt to discuss this with Dr. Dutch Quint on 2/27.  Pain has been well controlled.  Taking oxycodone very occasionally.  Has home health PT/OT.  Has not had a drink since he got home!  He is complaining of 2 months of very tender nipples and breasts.  No drainage from nipples.  He was started on seroquel for mood while at [facility] place.      Lab Results  Component Value Date   ALT 33 11/17/2011   AST 70* 11/17/2011   ALKPHOS 265* 11/17/2011   BILITOT 3.2* 11/17/2011    Patient Active Problem List  Diagnosis  . HYPERLIPIDEMIA  . Hyposmolality and/or hyponatremia  . HYPOKALEMIA  . ANXIETY STATE, UNSPECIFIED  . ALCOHOL ABUSE  . UNSPECIFIED HEARING LOSS  . HYPERTENSION  . ALLERGIC RHINITIS, SEASONAL  . BRONCHITIS, ALLERGIC  . ALCOHOLIC CIRRHOSIS OF LIVER  . BENIGN PROSTATIC HYPERTROPHY  . HYDROCELES, BILATERAL  . IMPOTENCE, ORGANIC ORIGIN  . ROSACEA  . LEG PAIN, LEFT  .  ORTHOSTATIC DIZZINESS  . MEMORY LOSS  . LEG EDEMA, BILATERAL  . ECCHYMOSES  . SHORTNESS OF BREATH  . NAUSEA  . ABDOMINAL DISTENSION  . OTHER ASCITES  . HYPERGLYCEMIA  . LIVER FUNCTION TESTS, ABNORMAL  . CERVICAL MUSCLE STRAIN  . UNSPECIFIED DEFICIENCY ANEMIA  . BPH (benign prostatic hyperplasia)  . Routine general medical examination at a health care facility  . Special screening for malignant neoplasms, colon  . Colon polyp  . Diverticulosis of colon (without mention of hemorrhage)  . Lower extremity edema  . Gastritis and duodenitis  . Gastric ulcer  . H/O: upper GI bleed  . Stomach ulcer from aspirin/ibuprofen-like drugs (NSAID's)  . Alcohol abuse  . Nocturnal muscle cramps  . Hyponatremia  . Urinary frequency  . Insect bites  . Fracture of humerus, proximal, left, closed  . Metabolic acidosis  . Leukocytosis  . Respiratory failure, post-operative  . Delirium tremens  . C2 cervical fracture  . Hypertrophy of male breast   Past Medical History  Diagnosis Date  . HLD (hyperlipidemia) 5/99  . HTN (hypertension) 5/99  . BPH (benign prostatic hypertrophy) 5/99  . GERD (gastroesophageal reflux disease) 07/31/04    gastritis   . Anxiety   . Allergy     pollen/ragweed  . Adenomatous polyp of colon 2006  . Hearing difficulty   . Depression   .  Humerus fracture 11/02/2011    LUE  . Peripheral vascular disease   . Pneumonia 1996    hosp  . Chronic bronchitis     "q year or q other year" (11/02/2011)  . History of blood transfusion 2012  . Anemia 2012  . Hernia, umbilical     "unrepaired" (11/02/2011)  . H/O hiatal hernia   . Memory loss     "recently has been forgetting alot; maybe related to the alcohol" (11/02/2011)  . Falls frequently     "daily lately; legs just give out" (11/02/2011)   Past Surgical History  Procedure Laterality Date  . L duputryen contractive surgery    . Neck mri  6/04    C5-6 disc abnormality  . Colonoscopy  07/31/04    multiple  polyps; repeat in 3 years  . Hosp cp r/o'd 2/24  03/08/07  . Ett myoview  03/09/07    nml EF 66%  . Upper gastrointestinal endoscopy    . Cataract extraction w/ intraocular lens  implant, bilateral  10/2002  . Tonsillectomy      "I was young" (11/02/2011)  . Hemorrhoid surgery      "cauterized a long time ago" (11/02/2011)  . Esophageal varice ligation  2012  . Orif humerus fracture  11/04/2011    Procedure: OPEN REDUCTION INTERNAL FIXATION (ORIF) PROXIMAL HUMERUS FRACTURE;  Surgeon: Budd Palmer, MD;  Location: [facility] OR;  Service: Orthopedics;  Laterality: Left;   History  Substance Use Topics  . Smoking status: Never Smoker   . Smokeless tobacco: Never Used  . Alcohol Use: 540.0 oz/week    900 Glasses of wine per week     Comment: 11/02/2011 "large box of wine q 2 days"   Family History  Problem Relation Age of Onset  . Heart disease Father   . Diabetes Father   . Stroke Father   . Depression Mother   . Heart disease Brother   . Alcohol abuse Brother   . Liver disease Brother    Allergies  Allergen Reactions  . Nifedipine     REACTION: SWELLING 11/02/2011 pt denies this allergy.   Current Outpatient Prescriptions on File Prior to Visit  Medication Sig Dispense Refill  . fluticasone (FLONASE) 50 MCG/ACT nasal spray Place 2 sprays into the nose as needed. For allergies      . folic acid (FOLVITE) 1 MG tablet Take 1 tablet (1 mg total) by mouth daily.      [name] gabapentin (NEURONTIN) 300 MG capsule Take 300 mg by mouth 3 (three) times daily.      [name] ibuprofen (ADVIL,MOTRIN) 200 MG tablet Take 200 mg by mouth every 6 (six) hours as needed. For pain      . magnesium chloride (SLOW-MAG) 64 MG TBEC Take 1 tablet (64 mg total) by mouth daily.  60 tablet    . Multiple Vitamin (MULTIVITAMIN WITH MINERALS) TABS Take 1 tablet by mouth daily.      . QUEtiapine (SEROQUEL) 200 MG tablet Take 1 tablet (200 mg total) by mouth at bedtime.  30 tablet  0  . Thiamine HCl (VITAMIN B-1) 100 MG  tablet Take 100 mg by mouth daily.       [name] albuterol (PROVENTIL) (5 MG/ML) 0.5% nebulizer solution Take 0.5 mLs (2.5 mg total) by nebulization every 2 (two) hours as needed for wheezing or shortness of breath.  20 mL  0  . b complex vitamins capsule Take 1 capsule by mouth daily.       [name]  feeding supplement (ENSURE) PUDG Take 1 Container by mouth 3 (three) times daily between meals.      . fexofenadine (ALLEGRA) 180 MG tablet Take 180 mg by mouth daily as needed. For allergies      . HYDROcodone-acetaminophen (NORCO/VICODIN) 5-325 MG per tablet Take 1 tablet by mouth every 6 (six) hours as needed. For pain.      [name] LORazepam (ATIVAN) 0.5 MG tablet Take 0.5 mg by mouth every 2 (two) hours as needed. For agitation.      . promethazine (PHENERGAN) 25 MG tablet Take 25 mg by mouth every 6 (six) hours as needed. For nausea.      [name] terazosin (HYTRIN) 2 MG capsule Take 1 capsule (2 mg total) by mouth at bedtime.  30 capsule  6   No current facility-administered medications on file prior to visit.   The PMH, PSH, Social History, Family History, Medications, and allergies have been reviewed in [facility], and have been updated if relevant.  Physical exam: BP 132/82  Pulse 100  Temp(Src) 98 F (36.7 C) (Oral)  Wt 193 lb (87.544 kg)  BMI 28.49 kg/m2  SpO2 97%  General: alert and well-developed, more color in his face today, wearing hard cervical collar Lungs: Normal respiratory effort, chest expands symmetrically. Decreased BS at bases bilaterally, right>left, no wheezing or crackles.  Breasts:   Does have some hypertrophy of breast tissue beneath nipples bilaterally, no nipple drainage, very TTP with manipulation of breast bilaterally. Heart: Normal rate and regular rhythm. S1 and S2 normal without gallop, murmur, click, rub or other extra sounds.  Abdomen: some distension, pos BS, no tenderness, no guarding  Neurologic: alert & oriented X3.  Psych: normally interactive and good eye contact.  Ext: No  edema  Assessment and Plan: 1. C2 cervical fracture Pain well controlled but unfortunately will need surgical repair.  Appt to discuss surgery scheduled for 2/27.  Will await neurosurgery recs.  2. ALCOHOL ABUSE Has not had a drink in several months!  He is aware that he has an addiction but does not want to go to AA.  Wife and son are supportive and at home.  3. Alcoholic cirrhosis of liver Recheck labs today. - Comprehensive metabolic panel - CBC with Differential  4. Hypertrophy of male breast I suspect this may be due to seroquel.  Check prolactin and other labs (see below).  Mammogram for further evaluation.  Based on blood work, may d/c seroquel.  The patient indicates understanding of these issues and agrees with the plan.  - TSH - Prolactin - MM Digital Diagnostic Bilat; Future  5. Encounter for therapeutic drug monitoring Will check a1c and CBC since he has been taking an antipsychotic. - HgB A1c - CBC with Differential

## 2012-02-29 NOTE — Patient Instructions (Addendum)
It's great to see you, [name]. You look fantastic! I will call you with your lab work tomorrow.  Please stop by to see [name] on your way out.

## 2012-03-02 ENCOUNTER — Other Ambulatory Visit: Payer: Self-pay | Admitting: Family Medicine

## 2012-03-02 DIAGNOSIS — I10 Essential (primary) hypertension: Secondary | ICD-10-CM

## 2012-03-02 DIAGNOSIS — N400 Enlarged prostate without lower urinary tract symptoms: Secondary | ICD-10-CM

## 2012-03-02 DIAGNOSIS — F1020 Alcohol dependence, uncomplicated: Secondary | ICD-10-CM

## 2012-03-02 DIAGNOSIS — S42309D Unspecified fracture of shaft of humerus, unspecified arm, subsequent encounter for fracture with routine healing: Secondary | ICD-10-CM

## 2012-03-03 ENCOUNTER — Other Ambulatory Visit (INDEPENDENT_AMBULATORY_CARE_PROVIDER_SITE_OTHER): Payer: Medicare Other

## 2012-03-03 ENCOUNTER — Ambulatory Visit: Payer: Medicare Other

## 2012-03-03 DIAGNOSIS — R0602 Shortness of breath: Secondary | ICD-10-CM | POA: Diagnosis not present

## 2012-03-03 DIAGNOSIS — J95821 Acute postprocedural respiratory failure: Secondary | ICD-10-CM

## 2012-03-03 DIAGNOSIS — E1059 Type 1 diabetes mellitus with other circulatory complications: Secondary | ICD-10-CM

## 2012-03-04 ENCOUNTER — Other Ambulatory Visit: Payer: Self-pay | Admitting: Family Medicine

## 2012-03-04 DIAGNOSIS — R7989 Other specified abnormal findings of blood chemistry: Secondary | ICD-10-CM

## 2012-03-04 LAB — BRAIN NATRIURETIC PEPTIDE: Brain Natriuretic Peptide: 27.4 pg/mL (ref 0.0–100.0)

## 2012-03-04 LAB — D-DIMER, QUANTITATIVE: D-Dimer, Quant: 0.8 ug/mL-FEU — ABNORMAL HIGH (ref 0.00–0.48)

## 2012-03-06 ENCOUNTER — Telehealth: Payer: Self-pay | Admitting: *Deleted

## 2012-03-06 ENCOUNTER — Ambulatory Visit (INDEPENDENT_AMBULATORY_CARE_PROVIDER_SITE_OTHER)
Admission: RE | Admit: 2012-03-06 | Discharge: 2012-03-06 | Disposition: A | Discharge status: Home / Self Care | Payer: Medicare Other | Source: Ambulatory Visit | Attending: Family Medicine | Admitting: Family Medicine

## 2012-03-06 DIAGNOSIS — R0602 Shortness of breath: Secondary | ICD-10-CM | POA: Diagnosis not present

## 2012-03-06 DIAGNOSIS — R7989 Other specified abnormal findings of blood chemistry: Secondary | ICD-10-CM

## 2012-03-06 DIAGNOSIS — R791 Abnormal coagulation profile: Secondary | ICD-10-CM

## 2012-03-06 MED ORDER — IOHEXOL 350 MG/ML SOLN
80.0000 mL | Freq: Once | INTRAVENOUS | Status: AC | PRN
  Administered 2012-03-06: 80 mL via INTRAVENOUS

## 2012-03-06 NOTE — Telephone Encounter (Signed)
Called report from [facility] in [place]- ultrasound negative for clot.  She will advise patient.

## 2012-03-07 NOTE — Telephone Encounter (Signed)
Noted! Thank you

## 2012-03-07 NOTE — Telephone Encounter (Signed)
Advised pt's wife, and apologized- explained to her that I was told by Okey Dupre, in [place], that she would advise pt and wife.  Wife is glad that results were good, but, she says pt is still short of breath with slight exertion.  His legs are still some swollen, worse than last week.

## 2012-03-07 NOTE — Telephone Encounter (Signed)
I apologize- call report stated pt was aware- it was neg for blood clot in lungs.

## 2012-03-07 NOTE — Telephone Encounter (Signed)
pts wife left v/m requesting results from CT scan.Please advise.

## 2012-03-08 ENCOUNTER — Ambulatory Visit
Admission: RE | Admit: 2012-03-08 | Discharge: 2012-03-08 | Disposition: A | Discharge status: Home / Self Care | Payer: Medicare Other | Source: Ambulatory Visit | Attending: Family Medicine | Admitting: Family Medicine

## 2012-03-08 ENCOUNTER — Telehealth: Payer: Self-pay | Admitting: *Deleted

## 2012-03-08 DIAGNOSIS — N62 Hypertrophy of breast: Secondary | ICD-10-CM | POA: Diagnosis not present

## 2012-03-08 NOTE — Telephone Encounter (Signed)
Pt was to have mammogram today but is unable to do that because he would have to remove his cervical collar and turn his head, and he's unable to do that.  I suggested they do an ultrasound instead.  They then came back and told pt's wife that they would try to do the mammogram without pt removing the collar and turning his head, and if unable to they will do an ultrasound.

## 2012-03-08 NOTE — Telephone Encounter (Signed)
Lab work was reassuring- I also checked a BNP which was normal which indicates no sign of heart failure.  This is likely due to deconditioning.  Let's keep an eye on it.

## 2012-03-08 NOTE — Telephone Encounter (Signed)
Noted! Thank you

## 2012-03-08 NOTE — Telephone Encounter (Signed)
Advised patient's wife. 

## 2012-03-09 ENCOUNTER — Other Ambulatory Visit: Payer: Self-pay | Admitting: Neurosurgery

## 2012-03-09 DIAGNOSIS — S129XXA Fracture of neck, unspecified, initial encounter: Secondary | ICD-10-CM | POA: Diagnosis not present

## 2012-03-14 ENCOUNTER — Encounter (HOSPITAL_COMMUNITY): Payer: Self-pay

## 2012-03-17 ENCOUNTER — Encounter (HOSPITAL_COMMUNITY)
Admission: RE | Admit: 2012-03-17 | Discharge: 2012-03-17 | Disposition: A | Discharge status: Home / Self Care | Payer: Medicare Other | Source: Ambulatory Visit | Attending: Neurosurgery | Admitting: Neurosurgery

## 2012-03-17 ENCOUNTER — Encounter (HOSPITAL_COMMUNITY): Payer: Self-pay

## 2012-03-17 ENCOUNTER — Encounter (HOSPITAL_COMMUNITY)
Admission: RE | Admit: 2012-03-17 | Discharge: 2012-03-17 | Disposition: A | Discharge status: Home / Self Care | Payer: Medicare Other | Source: Ambulatory Visit | Attending: Anesthesiology | Admitting: Anesthesiology

## 2012-03-17 DIAGNOSIS — Z01818 Encounter for other preprocedural examination: Secondary | ICD-10-CM | POA: Diagnosis not present

## 2012-03-17 HISTORY — DX: Other seasonal allergic rhinitis: J30.2

## 2012-03-17 LAB — CBC WITH DIFFERENTIAL/PLATELET
Eosinophils Relative: 2 % (ref 0–5)
HCT: 33.7 % — ABNORMAL LOW (ref 39.0–52.0)
Lymphocytes Relative: 22 % (ref 12–46)
Lymphs Abs: 1.6 10*3/uL (ref 0.7–4.0)
MCV: 78.9 fL (ref 78.0–100.0)
Neutro Abs: 4.8 10*3/uL (ref 1.7–7.7)
Platelets: 182 10*3/uL (ref 150–400)
RBC: 4.27 MIL/uL (ref 4.22–5.81)
WBC: 7.1 10*3/uL (ref 4.0–10.5)

## 2012-03-17 LAB — BASIC METABOLIC PANEL
CO2: 28 mEq/L (ref 19–32)
Calcium: 10.3 mg/dL (ref 8.4–10.5)
Creatinine, Ser: 1.28 mg/dL (ref 0.50–1.35)
Glucose, Bld: 104 mg/dL — ABNORMAL HIGH (ref 70–99)

## 2012-03-17 NOTE — Pre-Procedure Instructions (Signed)
[name]  03/17/2012   Your procedure is scheduled on: Tuesday, March 11th.  Report to [facility] at 10:30AM.  Call this number if you have problems the morning of surgery: [phone]   Remember:   Do not eat food or drink liquids after midnight.   Take these medicines the morning of surgery with A SIP OF WATER: Allergra.Gabapentin, Protonix. Use inhaler if needed.  Bring inhaler  to the hospital.   Methocarbamol (Robaxin) and Oxycodone if needed.   Do not wear jewelry, make-up or nail polish.  Do not wear lotions, powders, or perfumes. You may wear deodorant.   Men may shave face and neck.  Do not bring valuables to the hospital.  Contacts, dentures or bridgework may not be worn into surgery.  Leave suitcase in the car. After surgery it may be brought to your room.  For patients admitted to the hospital, checkout time is 11:00 AM the day of  discharge.     Special Instructions: Shower using CHG 2 nights before surgery and the night before surgery.  If you shower the day of surgery use CHG.  Use special wash - you have one bottle of CHG for all showers.  You should use approximately 1/3 of the bottle for each shower.   Please read over the following fact sheets that you were given: Pain Booklet, Coughing and Deep Breathing, Blood Transfusion Information and Surgical Site Infection Prevention

## 2012-03-20 MED ORDER — CEFAZOLIN SODIUM-DEXTROSE 2-3 GM-% IV SOLR
2.0000 g | INTRAVENOUS | Status: AC
  Administered 2012-03-21: 2 g via INTRAVENOUS
  Filled 2012-03-20: qty 50

## 2012-03-21 ENCOUNTER — Encounter (HOSPITAL_COMMUNITY): Payer: Self-pay | Admitting: Certified Registered"

## 2012-03-21 ENCOUNTER — Inpatient Hospital Stay (HOSPITAL_COMMUNITY): Payer: Medicare Other | Admitting: Certified Registered"

## 2012-03-21 ENCOUNTER — Inpatient Hospital Stay (HOSPITAL_COMMUNITY): Payer: Medicare Other

## 2012-03-21 ENCOUNTER — Encounter (HOSPITAL_COMMUNITY): Payer: Self-pay | Admitting: *Deleted

## 2012-03-21 ENCOUNTER — Encounter (HOSPITAL_COMMUNITY)
Admission: RE | Disposition: A | Discharge status: Skilled Nursing Facility | Payer: Self-pay | Source: Ambulatory Visit | Attending: Neurosurgery

## 2012-03-21 ENCOUNTER — Inpatient Hospital Stay (HOSPITAL_COMMUNITY)
Admission: RE | Admit: 2012-03-21 | Discharge: 2012-03-28 | DRG: 472 | Disposition: A | Discharge status: Skilled Nursing Facility | Payer: Medicare Other | Source: Ambulatory Visit | Attending: Neurosurgery | Admitting: Neurosurgery

## 2012-03-21 DIAGNOSIS — E785 Hyperlipidemia, unspecified: Secondary | ICD-10-CM | POA: Diagnosis present

## 2012-03-21 DIAGNOSIS — W19XXXA Unspecified fall, initial encounter: Secondary | ICD-10-CM | POA: Diagnosis present

## 2012-03-21 DIAGNOSIS — Z01812 Encounter for preprocedural laboratory examination: Secondary | ICD-10-CM

## 2012-03-21 DIAGNOSIS — J42 Unspecified chronic bronchitis: Secondary | ICD-10-CM | POA: Diagnosis present

## 2012-03-21 DIAGNOSIS — S12100A Unspecified displaced fracture of second cervical vertebra, initial encounter for closed fracture: Secondary | ICD-10-CM | POA: Diagnosis not present

## 2012-03-21 DIAGNOSIS — Z79899 Other long term (current) drug therapy: Secondary | ICD-10-CM

## 2012-03-21 DIAGNOSIS — D62 Acute posthemorrhagic anemia: Secondary | ICD-10-CM

## 2012-03-21 DIAGNOSIS — Z818 Family history of other mental and behavioral disorders: Secondary | ICD-10-CM

## 2012-03-21 DIAGNOSIS — N400 Enlarged prostate without lower urinary tract symptoms: Secondary | ICD-10-CM | POA: Diagnosis present

## 2012-03-21 DIAGNOSIS — K219 Gastro-esophageal reflux disease without esophagitis: Secondary | ICD-10-CM | POA: Diagnosis not present

## 2012-03-21 DIAGNOSIS — I10 Essential (primary) hypertension: Secondary | ICD-10-CM | POA: Diagnosis present

## 2012-03-21 DIAGNOSIS — F329 Major depressive disorder, single episode, unspecified: Secondary | ICD-10-CM | POA: Diagnosis present

## 2012-03-21 DIAGNOSIS — N401 Enlarged prostate with lower urinary tract symptoms: Secondary | ICD-10-CM | POA: Diagnosis not present

## 2012-03-21 DIAGNOSIS — F3289 Other specified depressive episodes: Secondary | ICD-10-CM | POA: Diagnosis present

## 2012-03-21 DIAGNOSIS — Z9181 History of falling: Secondary | ICD-10-CM

## 2012-03-21 DIAGNOSIS — Z981 Arthrodesis status: Secondary | ICD-10-CM | POA: Diagnosis not present

## 2012-03-21 DIAGNOSIS — F411 Generalized anxiety disorder: Secondary | ICD-10-CM | POA: Diagnosis present

## 2012-03-21 DIAGNOSIS — Z823 Family history of stroke: Secondary | ICD-10-CM

## 2012-03-21 DIAGNOSIS — Z8249 Family history of ischemic heart disease and other diseases of the circulatory system: Secondary | ICD-10-CM

## 2012-03-21 DIAGNOSIS — I739 Peripheral vascular disease, unspecified: Secondary | ICD-10-CM | POA: Diagnosis present

## 2012-03-21 DIAGNOSIS — S12000A Unspecified displaced fracture of first cervical vertebra, initial encounter for closed fracture: Secondary | ICD-10-CM | POA: Diagnosis not present

## 2012-03-21 DIAGNOSIS — H9190 Unspecified hearing loss, unspecified ear: Secondary | ICD-10-CM | POA: Diagnosis present

## 2012-03-21 DIAGNOSIS — F1010 Alcohol abuse, uncomplicated: Secondary | ICD-10-CM | POA: Diagnosis present

## 2012-03-21 DIAGNOSIS — M539 Dorsopathy, unspecified: Secondary | ICD-10-CM | POA: Diagnosis not present

## 2012-03-21 DIAGNOSIS — Z833 Family history of diabetes mellitus: Secondary | ICD-10-CM

## 2012-03-21 DIAGNOSIS — Z6379 Other stressful life events affecting family and household: Secondary | ICD-10-CM

## 2012-03-21 HISTORY — PX: POSTERIOR CERVICAL FUSION/FORAMINOTOMY: SHX5038

## 2012-03-21 SURGERY — POSTERIOR CERVICAL FUSION/FORAMINOTOMY LEVEL 1
Anesthesia: General | Site: Spine Cervical | Wound class: Clean

## 2012-03-21 MED ORDER — THROMBIN 5000 UNITS EX SOLR
CUTANEOUS | Status: DC | PRN
  Administered 2012-03-21 (×2): 5000 [IU] via TOPICAL

## 2012-03-21 MED ORDER — FOLIC ACID 1 MG PO TABS
1.0000 mg | ORAL_TABLET | Freq: Every day | ORAL | Status: DC
  Administered 2012-03-22 – 2012-03-28 (×7): 1 mg via ORAL
  Filled 2012-03-21 (×7): qty 1

## 2012-03-21 MED ORDER — MAGNESIUM OXIDE 400 (241.3 MG) MG PO TABS
400.0000 mg | ORAL_TABLET | Freq: Two times a day (BID) | ORAL | Status: DC
  Administered 2012-03-21 – 2012-03-28 (×14): 400 mg via ORAL
  Filled 2012-03-21 (×15): qty 1

## 2012-03-21 MED ORDER — HYDROMORPHONE HCL PF 1 MG/ML IJ SOLN
0.5000 mg | INTRAMUSCULAR | Status: DC | PRN
  Administered 2012-03-21 – 2012-03-26 (×4): 1 mg via INTRAVENOUS
  Filled 2012-03-21 (×4): qty 1

## 2012-03-21 MED ORDER — OXYCODONE-ACETAMINOPHEN 5-325 MG PO TABS
1.0000 | ORAL_TABLET | ORAL | Status: DC | PRN
  Administered 2012-03-21 – 2012-03-28 (×25): 2 via ORAL
  Filled 2012-03-21 (×26): qty 2

## 2012-03-21 MED ORDER — SODIUM CHLORIDE 0.9 % IV SOLN
INTRAVENOUS | Status: DC | PRN
  Administered 2012-03-21: 15:00:00 via INTRAVENOUS

## 2012-03-21 MED ORDER — FLEET ENEMA 7-19 GM/118ML RE ENEM: 1.0000 | ENEMA | Freq: Once | RECTAL | Status: AC | PRN

## 2012-03-21 MED ORDER — HYDROMORPHONE HCL PF 1 MG/ML IJ SOLN
0.2500 mg | INTRAMUSCULAR | Status: DC | PRN
  Administered 2012-03-21 (×4): 0.5 mg via INTRAVENOUS

## 2012-03-21 MED ORDER — SODIUM CHLORIDE 0.9 % IJ SOLN: 3.0000 mL | INTRAMUSCULAR | Status: DC | PRN

## 2012-03-21 MED ORDER — THROMBIN 20000 UNITS EX SOLR
CUTANEOUS | Status: DC | PRN
  Administered 2012-03-21 (×2): via TOPICAL

## 2012-03-21 MED ORDER — METHOCARBAMOL 500 MG PO TABS
500.0000 mg | ORAL_TABLET | Freq: Three times a day (TID) | ORAL | Status: DC | PRN
  Administered 2012-03-21 – 2012-03-26 (×6): 500 mg via ORAL
  Filled 2012-03-21 (×7): qty 1

## 2012-03-21 MED ORDER — LORATADINE 10 MG PO TABS
10.0000 mg | ORAL_TABLET | Freq: Every day | ORAL | Status: DC
  Administered 2012-03-22 – 2012-03-28 (×7): 10 mg via ORAL
  Filled 2012-03-21 (×7): qty 1

## 2012-03-21 MED ORDER — MENTHOL 3 MG MT LOZG: 1.0000 | LOZENGE | OROMUCOSAL | Status: DC | PRN

## 2012-03-21 MED ORDER — SODIUM CHLORIDE 0.9 % IJ SOLN
3.0000 mL | Freq: Two times a day (BID) | INTRAMUSCULAR | Status: DC
  Administered 2012-03-22 – 2012-03-28 (×11): 3 mL via INTRAVENOUS

## 2012-03-21 MED ORDER — MAGNESIUM 500 MG PO TABS: 500.0000 mg | ORAL_TABLET | Freq: Two times a day (BID) | ORAL | Status: DC

## 2012-03-21 MED ORDER — GLYCOPYRROLATE 0.2 MG/ML IJ SOLN
INTRAMUSCULAR | Status: DC | PRN
  Administered 2012-03-21: .8 mg via INTRAVENOUS

## 2012-03-21 MED ORDER — CALCIUM CHLORIDE 10 % IV SOLN
INTRAVENOUS | Status: DC | PRN
  Administered 2012-03-21 (×5): 200 mg via INTRAVENOUS

## 2012-03-21 MED ORDER — HEMOSTATIC AGENTS (NO CHARGE) OPTIME
TOPICAL | Status: DC | PRN
  Administered 2012-03-21: 1 via TOPICAL

## 2012-03-21 MED ORDER — QUETIAPINE FUMARATE 50 MG PO TABS
150.0000 mg | ORAL_TABLET | Freq: Every day | ORAL | Status: DC
  Administered 2012-03-21 – 2012-03-27 (×7): 150 mg via ORAL
  Filled 2012-03-21 (×8): qty 1

## 2012-03-21 MED ORDER — ROCURONIUM BROMIDE 100 MG/10ML IV SOLN
INTRAVENOUS | Status: DC | PRN
  Administered 2012-03-21: 10 mg via INTRAVENOUS
  Administered 2012-03-21: 30 mg via INTRAVENOUS
  Administered 2012-03-21: 20 mg via INTRAVENOUS
  Administered 2012-03-21: 10 mg via INTRAVENOUS

## 2012-03-21 MED ORDER — PHENOL 1.4 % MT LIQD: 1.0000 | OROMUCOSAL | Status: DC | PRN

## 2012-03-21 MED ORDER — ALBUTEROL SULFATE (5 MG/ML) 0.5% IN NEBU
2.5000 mg | INHALATION_SOLUTION | RESPIRATORY_TRACT | Status: DC | PRN
Start: 2012-03-21 — End: 2012-03-28

## 2012-03-21 MED ORDER — SENNA 8.6 MG PO TABS
1.0000 | ORAL_TABLET | Freq: Two times a day (BID) | ORAL | Status: DC
  Administered 2012-03-22 – 2012-03-28 (×13): 8.6 mg via ORAL
  Filled 2012-03-21 (×17): qty 1

## 2012-03-21 MED ORDER — CEFAZOLIN SODIUM 1-5 GM-% IV SOLN
1.0000 g | Freq: Three times a day (TID) | INTRAVENOUS | Status: AC
  Administered 2012-03-21 – 2012-03-22 (×2): 1 g via INTRAVENOUS
  Filled 2012-03-21 (×3): qty 50

## 2012-03-21 MED ORDER — CYCLOBENZAPRINE HCL 10 MG PO TABS
10.0000 mg | ORAL_TABLET | Freq: Three times a day (TID) | ORAL | Status: DC | PRN
  Administered 2012-03-21 – 2012-03-28 (×7): 10 mg via ORAL
  Filled 2012-03-21 (×6): qty 1

## 2012-03-21 MED ORDER — PHENYLEPHRINE HCL 10 MG/ML IJ SOLN
INTRAMUSCULAR | Status: DC | PRN
  Administered 2012-03-21: 80 ug via INTRAVENOUS
  Administered 2012-03-21: 100 ug via INTRAVENOUS
  Administered 2012-03-21: 200 ug via INTRAVENOUS
  Administered 2012-03-21: 80 ug via INTRAVENOUS
  Administered 2012-03-21: 200 ug via INTRAVENOUS
  Administered 2012-03-21 (×2): 80 ug via INTRAVENOUS
  Administered 2012-03-21: 200 ug via INTRAVENOUS
  Administered 2012-03-21: 100 ug via INTRAVENOUS
  Administered 2012-03-21: 80 ug via INTRAVENOUS
  Administered 2012-03-21: 100 ug via INTRAVENOUS

## 2012-03-21 MED ORDER — LACTATED RINGERS IV SOLN
INTRAVENOUS | Status: DC | PRN
  Administered 2012-03-21 (×2): via INTRAVENOUS

## 2012-03-21 MED ORDER — 0.9 % SODIUM CHLORIDE (POUR BTL) OPTIME
TOPICAL | Status: DC | PRN
  Administered 2012-03-21: 1000 mL

## 2012-03-21 MED ORDER — SODIUM CHLORIDE 0.9 % IV SOLN
INTRAVENOUS | Status: AC
  Filled 2012-03-21: qty 500

## 2012-03-21 MED ORDER — ACETAMINOPHEN 325 MG PO TABS: 650.0000 mg | ORAL_TABLET | ORAL | Status: DC | PRN

## 2012-03-21 MED ORDER — SODIUM CHLORIDE 0.9 % IV SOLN
10.0000 mg | INTRAVENOUS | Status: DC | PRN
  Administered 2012-03-21: 50 ug/min via INTRAVENOUS

## 2012-03-21 MED ORDER — HYDROCODONE-ACETAMINOPHEN 5-325 MG PO TABS: 1.0000 | ORAL_TABLET | ORAL | Status: DC | PRN

## 2012-03-21 MED ORDER — CYCLOBENZAPRINE HCL 10 MG PO TABS
ORAL_TABLET | ORAL | Status: AC
  Filled 2012-03-21: qty 1

## 2012-03-21 MED ORDER — DEXAMETHASONE SODIUM PHOSPHATE 10 MG/ML IJ SOLN
10.0000 mg | INTRAMUSCULAR | Status: AC
  Administered 2012-03-21: 10 mg via INTRAVENOUS
  Filled 2012-03-21: qty 1

## 2012-03-21 MED ORDER — ONDANSETRON HCL 4 MG/2ML IJ SOLN: 4.0000 mg | Freq: Once | INTRAMUSCULAR | Status: DC | PRN

## 2012-03-21 MED ORDER — PROPOFOL 10 MG/ML IV BOLUS
INTRAVENOUS | Status: DC | PRN
  Administered 2012-03-21: 200 mg via INTRAVENOUS
  Administered 2012-03-21: 50 mg via INTRAVENOUS

## 2012-03-21 MED ORDER — ONDANSETRON HCL 4 MG/2ML IJ SOLN: 4.0000 mg | INTRAMUSCULAR | Status: DC | PRN

## 2012-03-21 MED ORDER — OXYCODONE HCL 5 MG PO TABS
10.0000 mg | ORAL_TABLET | Freq: Four times a day (QID) | ORAL | Status: DC | PRN
  Administered 2012-03-21 – 2012-03-28 (×12): 10 mg via ORAL
  Filled 2012-03-21 (×12): qty 2

## 2012-03-21 MED ORDER — LIDOCAINE HCL (CARDIAC) 20 MG/ML IV SOLN
INTRAVENOUS | Status: DC | PRN
  Administered 2012-03-21: 100 mg via INTRAVENOUS

## 2012-03-21 MED ORDER — HYDROMORPHONE HCL PF 1 MG/ML IJ SOLN
INTRAMUSCULAR | Status: AC
  Filled 2012-03-21: qty 1

## 2012-03-21 MED ORDER — BUPIVACAINE HCL (PF) 0.25 % IJ SOLN
INTRAMUSCULAR | Status: DC | PRN
  Administered 2012-03-21: 30 mL

## 2012-03-21 MED ORDER — ACETAMINOPHEN 650 MG RE SUPP: 650.0000 mg | RECTAL | Status: DC | PRN

## 2012-03-21 MED ORDER — ONDANSETRON HCL 4 MG/2ML IJ SOLN
INTRAMUSCULAR | Status: DC | PRN
  Administered 2012-03-21: 4 mg via INTRAVENOUS

## 2012-03-21 MED ORDER — SODIUM CHLORIDE 0.9 % IV SOLN: 250.0000 mL | INTRAVENOUS | Status: DC

## 2012-03-21 MED ORDER — ACETAMINOPHEN 10 MG/ML IV SOLN
INTRAVENOUS | Status: AC
  Administered 2012-03-21: 1000 mg via INTRAVENOUS
  Filled 2012-03-21: qty 100

## 2012-03-21 MED ORDER — ZOLPIDEM TARTRATE 5 MG PO TABS
5.0000 mg | ORAL_TABLET | Freq: Every evening | ORAL | Status: DC | PRN
  Administered 2012-03-21 – 2012-03-27 (×7): 5 mg via ORAL
  Filled 2012-03-21 (×7): qty 1

## 2012-03-21 MED ORDER — HYDROMORPHONE HCL PF 1 MG/ML IJ SOLN: 0.2500 mg | INTRAMUSCULAR | Status: DC | PRN

## 2012-03-21 MED ORDER — BACITRACIN 50000 UNITS IM SOLR
INTRAMUSCULAR | Status: AC
  Filled 2012-03-21: qty 1

## 2012-03-21 MED ORDER — POLYETHYLENE GLYCOL 3350 17 G PO PACK
17.0000 g | PACK | Freq: Every day | ORAL | Status: DC | PRN
  Filled 2012-03-21: qty 1

## 2012-03-21 MED ORDER — SPIRONOLACTONE 50 MG PO TABS
50.0000 mg | ORAL_TABLET | Freq: Every day | ORAL | Status: DC
  Administered 2012-03-22 – 2012-03-28 (×7): 50 mg via ORAL
  Filled 2012-03-21 (×7): qty 1

## 2012-03-21 MED ORDER — BACITRACIN ZINC 500 UNIT/GM EX OINT
TOPICAL_OINTMENT | CUTANEOUS | Status: DC | PRN
  Administered 2012-03-21: 1 via TOPICAL

## 2012-03-21 MED ORDER — OXYCODONE HCL 5 MG PO TABS
ORAL_TABLET | ORAL | Status: AC
  Filled 2012-03-21: qty 2

## 2012-03-21 MED ORDER — FENTANYL CITRATE 0.05 MG/ML IJ SOLN
INTRAMUSCULAR | Status: DC | PRN
  Administered 2012-03-21 (×2): 100 ug via INTRAVENOUS
  Administered 2012-03-21: 50 ug via INTRAVENOUS

## 2012-03-21 MED ORDER — VITAMIN B-1 100 MG PO TABS
100.0000 mg | ORAL_TABLET | Freq: Every day | ORAL | Status: DC
  Administered 2012-03-22 – 2012-03-28 (×7): 100 mg via ORAL
  Filled 2012-03-21 (×7): qty 1

## 2012-03-21 MED ORDER — BISACODYL 10 MG RE SUPP: 10.0000 mg | Freq: Every day | RECTAL | Status: DC | PRN

## 2012-03-21 MED ORDER — ARTIFICIAL TEARS OP OINT
TOPICAL_OINTMENT | OPHTHALMIC | Status: DC | PRN
  Administered 2012-03-21: 1 via OPHTHALMIC

## 2012-03-21 MED ORDER — ALUM & MAG HYDROXIDE-SIMETH 200-200-20 MG/5ML PO SUSP: 30.0000 mL | Freq: Four times a day (QID) | ORAL | Status: DC | PRN

## 2012-03-21 MED ORDER — NEOSTIGMINE METHYLSULFATE 1 MG/ML IJ SOLN
INTRAMUSCULAR | Status: DC | PRN
  Administered 2012-03-21: 5 mg via INTRAVENOUS

## 2012-03-21 MED ORDER — SODIUM CHLORIDE 0.9 % IR SOLN
Status: DC | PRN
  Administered 2012-03-21: 14:00:00

## 2012-03-21 MED ORDER — FLUTICASONE PROPIONATE 50 MCG/ACT NA SUSP: 2.0000 | NASAL | Status: DC | PRN

## 2012-03-21 MED ORDER — PANTOPRAZOLE SODIUM 40 MG PO TBEC
40.0000 mg | DELAYED_RELEASE_TABLET | Freq: Two times a day (BID) | ORAL | Status: DC
  Administered 2012-03-21 – 2012-03-28 (×14): 40 mg via ORAL
  Filled 2012-03-21 (×12): qty 1

## 2012-03-21 MED ORDER — GABAPENTIN 300 MG PO CAPS
300.0000 mg | ORAL_CAPSULE | Freq: Four times a day (QID) | ORAL | Status: DC
  Administered 2012-03-21 – 2012-03-28 (×27): 300 mg via ORAL
  Filled 2012-03-21 (×29): qty 1

## 2012-03-21 MED ORDER — ALBUMIN HUMAN 5 % IV SOLN
INTRAVENOUS | Status: DC | PRN
  Administered 2012-03-21 (×2): via INTRAVENOUS

## 2012-03-21 MED ORDER — SUCCINYLCHOLINE CHLORIDE 20 MG/ML IJ SOLN
INTRAMUSCULAR | Status: DC | PRN
  Administered 2012-03-21: 120 mg via INTRAVENOUS

## 2012-03-21 SURGICAL SUPPLY — 59 items
ADH SKN CLS APL DERMABOND .7 (GAUZE/BANDAGES/DRESSINGS) ×1
APL SKNCLS STERI-STRIP NONHPOA (GAUZE/BANDAGES/DRESSINGS) ×1
BAG DECANTER FOR FLEXI CONT (MISCELLANEOUS) ×2 IMPLANT
BENZOIN TINCTURE PRP APPL 2/3 (GAUZE/BANDAGES/DRESSINGS) ×2 IMPLANT
BIT DRILL 2.4X (BIT) IMPLANT
BIT DRL 2.4X (BIT) ×1
BLADE SURG ROTATE 9660 (MISCELLANEOUS) ×1 IMPLANT
BRUSH SCRUB EZ PLAIN DRY (MISCELLANEOUS) ×2 IMPLANT
BUR MATCHSTICK NEURO 3.0 LAGG (BURR) ×2 IMPLANT
CABLE SNG STERILE W/CRIMP (Neuro Prosthesis/Implant) ×2 IMPLANT
CANISTER SUCTION 2500CC (MISCELLANEOUS) ×2 IMPLANT
CLOTH BEACON ORANGE TIMEOUT ST (SAFETY) ×2 IMPLANT
CONT SPEC 4OZ CLIKSEAL STRL BL (MISCELLANEOUS) ×4 IMPLANT
DERMABOND ADVANCED (GAUZE/BANDAGES/DRESSINGS) ×1
DERMABOND ADVANCED .7 DNX12 (GAUZE/BANDAGES/DRESSINGS) ×1 IMPLANT
DRAPE C-ARM 42X72 X-RAY (DRAPES) ×4 IMPLANT
DRAPE LAPAROTOMY 100X72 PEDS (DRAPES) ×2 IMPLANT
DRAPE POUCH INSTRU U-SHP 10X18 (DRAPES) ×2 IMPLANT
DRILL BIT (BIT) ×2
ELECT REM PT RETURN 9FT ADLT (ELECTROSURGICAL) ×2
ELECTRODE REM PT RTRN 9FT ADLT (ELECTROSURGICAL) ×1 IMPLANT
EVACUATOR 1/8 PVC DRAIN (DRAIN) ×2 IMPLANT
GAUZE SPONGE 4X4 16PLY XRAY LF (GAUZE/BANDAGES/DRESSINGS) ×1 IMPLANT
GLOVE ECLIPSE 8.5 STRL (GLOVE) ×3 IMPLANT
GLOVE EXAM NITRILE LRG STRL (GLOVE) IMPLANT
GLOVE EXAM NITRILE MD LF STRL (GLOVE) IMPLANT
GLOVE EXAM NITRILE XL STR (GLOVE) IMPLANT
GLOVE EXAM NITRILE XS STR PU (GLOVE) IMPLANT
GOWN BRE IMP SLV AUR LG STRL (GOWN DISPOSABLE) ×1 IMPLANT
GOWN BRE IMP SLV AUR XL STRL (GOWN DISPOSABLE) ×4 IMPLANT
GOWN STRL REIN 2XL LVL4 (GOWN DISPOSABLE) IMPLANT
KIT BASIN OR (CUSTOM PROCEDURE TRAY) ×2 IMPLANT
KIT ROOM TURNOVER OR (KITS) ×2 IMPLANT
NEEDLE HYPO 22GX1.5 SAFETY (NEEDLE) ×2 IMPLANT
NEEDLE SPNL 22GX3.5 QUINCKE BK (NEEDLE) ×2 IMPLANT
NS IRRIG 1000ML POUR BTL (IV SOLUTION) ×2 IMPLANT
PACK LAMINECTOMY NEURO (CUSTOM PROCEDURE TRAY) ×2 IMPLANT
PAD ARMBOARD 7.5X6 YLW CONV (MISCELLANEOUS) ×6 IMPLANT
PATTIES SURGICAL .5 X.5 (GAUZE/BANDAGES/DRESSINGS) ×1 IMPLANT
PIN MAYFIELD SKULL DISP (PIN) ×2 IMPLANT
ROD VERTEX 3.2MM 240MM (Rod) ×1 IMPLANT
SCREW SET M6 (Screw) ×8 IMPLANT
SCREW VERTEX 3.5X18 MAX (Screw) ×2 IMPLANT
SCREW VERTEX PT 3.5X30 (Screw) ×2 IMPLANT
SPONGE GAUZE 4X4 12PLY (GAUZE/BANDAGES/DRESSINGS) ×3 IMPLANT
SPONGE LAP 4X18 X RAY DECT (DISPOSABLE) IMPLANT
SPONGE SURGIFOAM ABS GEL 100 (HEMOSTASIS) ×2 IMPLANT
SPONGE SURGIFOAM ABS GEL SZ50 (HEMOSTASIS) ×2 IMPLANT
STAPLER SKIN PROX WIDE 3.9 (STAPLE) ×1 IMPLANT
STRIP CLOSURE SKIN 1/2X4 (GAUZE/BANDAGES/DRESSINGS) ×3 IMPLANT
SUT VIC AB 0 CT1 18XCR BRD8 (SUTURE) ×1 IMPLANT
SUT VIC AB 0 CT1 8-18 (SUTURE) ×6
SUT VIC AB 2-0 CT1 18 (SUTURE) ×4 IMPLANT
SUT VIC AB 3-0 SH 8-18 (SUTURE) ×3 IMPLANT
SYR 20ML ECCENTRIC (SYRINGE) ×2 IMPLANT
TAPE CLOTH SURG 4X10 WHT LF (GAUZE/BANDAGES/DRESSINGS) ×4 IMPLANT
TOWEL OR 17X24 6PK STRL BLUE (TOWEL DISPOSABLE) ×2 IMPLANT
TOWEL OR 17X26 10 PK STRL BLUE (TOWEL DISPOSABLE) ×2 IMPLANT
WATER STERILE IRR 1000ML POUR (IV SOLUTION) ×2 IMPLANT

## 2012-03-21 NOTE — Transfer of Care (Signed)
Immediate Anesthesia Transfer of Care Note  Patient: [name]  Procedure(s) Performed: Procedure(s) with comments: POSTERIOR CERVICAL FUSION/FORAMINOTOMY LEVEL ONE (N/A) - POSTERIOR CERVICAL ONE TO CERVICAL TWO FUSION WITH ILIAC CREST GRAFT AND LATERAL MASS SCREWS   Patient Location: PACU  Anesthesia Type:General  Level of Consciousness: awake, alert  and oriented  Airway & Oxygen Therapy: Patient Spontanous Breathing and Patient connected to nasal cannula oxygen  Post-op Assessment: Report given to PACU RN and Patient moving all extremities X 4  Post vital signs: Reviewed and stable  Complications: No apparent anesthesia complications

## 2012-03-21 NOTE — Preoperative (Signed)
Beta Blockers   Reason not to administer Beta Blockers:Not Applicable 

## 2012-03-21 NOTE — H&P (Signed)
[name] is an 67 y.o. male.   Chief Complaint: C2 fracture HPI: 67 year old male status post fall approximately 5 months ago with resultant type II odontoid fracture. Patient had been treated nonoperatively with a cervical brace. Although he is maintaining good alignment there is no evidence of healing across the fracture site and in fact there's been bony resorption around the fracture site. Patient has some mild neck pain but no other neurologic symptoms he presents now for C1-2 posterior cervical fusion.  Past Medical History  Diagnosis Date  . HLD (hyperlipidemia) 5/99  . HTN (hypertension) 5/99  . BPH (benign prostatic hypertrophy) 5/99  . GERD (gastroesophageal reflux disease) 07/31/04    gastritis   . Anxiety   . Allergy     pollen/ragweed  . Adenomatous polyp of colon 2006  . Hearing difficulty   . Humerus fracture 11/02/2011    LUE  . Peripheral vascular disease   . Pneumonia 1996    hosp  . Chronic bronchitis     "q year or q other year" (11/02/2011)  . History of blood transfusion 2012  . Anemia 2012  . Hernia, umbilical     "unrepaired" (11/02/2011)  . H/O hiatal hernia   . Memory loss     "recently has been forgetting alot; maybe related to the alcohol" (11/02/2011)  . Falls frequently     "daily lately; legs just give out" (11/02/2011)  . H/O nausea   . Depression     Hx of  . Shortness of breath     with exertion  . Seasonal allergies     Past Surgical History  Procedure Laterality Date  . L duputryen contractive surgery    . Neck mri  6/04    C5-6 disc abnormality  . Colonoscopy  07/31/04    multiple polyps; repeat in 3 years  . Hosp cp r/o'd 2/24  03/08/07  . Ett myoview  03/09/07    nml EF 66%  . Upper gastrointestinal endoscopy    . Cataract extraction w/ intraocular lens  implant, bilateral  10/2002  . Tonsillectomy      "I was young" (11/02/2011)  . Hemorrhoid surgery      "cauterized a long time ago" (11/02/2011)  . Esophageal varice  ligation  2012  . Orif humerus fracture  11/04/2011    Procedure: OPEN REDUCTION INTERNAL FIXATION (ORIF) PROXIMAL HUMERUS FRACTURE;  Surgeon: Budd Palmer, MD;  Location: [facility] OR;  Service: Orthopedics;  Laterality: Left;  . Humerus fracture surgery Left 11/04/11    with plate to the shoulder    Family History  Problem Relation Age of Onset  . Heart disease Father   . Diabetes Father   . Stroke Father   . Depression Mother   . Heart disease Brother   . Alcohol abuse Brother   . Liver disease Brother    Social History:  reports that he has never smoked. He has never used smokeless tobacco. He reports that he drinks about 540.0 ounces of alcohol per week. He reports that he does not use illicit drugs.  Allergies:  Allergies  Allergen Reactions  . Nifedipine     REACTION: SWELLING 11/02/2011 pt denies this allergy.    Medications Prior to Admission  Medication Sig Dispense Refill  . B Complex-C-Folic Acid (VITA-BEE/C PO) Take 1 tablet by mouth daily.      . fexofenadine (ALLEGRA) 180 MG tablet Take 180 mg by mouth daily as needed. For allergies      .  folic acid (FOLVITE) 1 MG tablet Take 1 mg by mouth daily.      [name] gabapentin (NEURONTIN) 300 MG capsule Take 300 mg by mouth 4 (four) times daily.       . Magnesium 500 MG TABS Take 500 mg by mouth 2 (two) times daily.      . methocarbamol (ROBAXIN) 500 MG tablet Take 500 mg by mouth 3 (three) times daily as needed (for pain and spasms).      . Misc Natural Products (OSTEO BI-FLEX JOINT SHIELD PO) Take 2-3 tablets by mouth daily.      . Multiple Vitamins-Minerals (CERTA-VITE PO) Take 1 tablet by mouth daily.      [name] oxyCODONE (OXY IR/ROXICODONE) 5 MG immediate release tablet Take 10 mg by mouth every 6 (six) hours as needed for pain.      . pantoprazole (PROTONIX) 40 MG tablet Take 40 mg by mouth 2 (two) times daily.      . QUEtiapine (SEROQUEL) 100 MG tablet Take 150 mg by mouth at bedtime.      [name] spironolactone (ALDACTONE) 50 MG  tablet Take 50 mg by mouth daily.      . Thiamine HCl (VITAMIN B-1) 100 MG tablet Take 100 mg by mouth daily.       [name] zolpidem (AMBIEN) 5 MG tablet Take 5 mg by mouth at bedtime as needed for sleep.      [name] albuterol (PROVENTIL) (5 MG/ML) 0.5% nebulizer solution Take 2.5 mg by nebulization every 2 (two) hours as needed for wheezing or shortness of breath.      . fluticasone (FLONASE) 50 MCG/ACT nasal spray Place 2 sprays into the nose as needed. For allergies        No results found for this or any previous visit (from the past 48 hour(s)). No results found.  Review of Systems  Constitutional: Negative.   HENT: Negative.   Eyes: Negative.   Respiratory: Negative.   Cardiovascular: Negative.   Gastrointestinal: Negative.   Genitourinary: Negative.   Musculoskeletal: Negative.   Skin: Negative.   Neurological: Negative.   Endo/Heme/Allergies: Negative.   Psychiatric/Behavioral: Negative.     Blood pressure 125/79, pulse 86, temperature 98.1 F (36.7 C), temperature source Oral, resp. rate 18, SpO2 97.00%. Physical Exam  Constitutional: He is oriented to person, place, and time. He appears well-developed and well-nourished.  HENT:  Head: Normocephalic and atraumatic.  Right Ear: External ear normal.  Left Ear: External ear normal.  Nose: Nose normal.  Mouth/Throat: Oropharynx is clear and moist.  Eyes: Conjunctivae and EOM are normal. Pupils are equal, round, and reactive to light. Right eye exhibits no discharge. Left eye exhibits no discharge.  Neck: Normal range of motion. Neck supple. No tracheal deviation present. No thyromegaly present.  Cardiovascular: Normal rate, regular rhythm, normal heart sounds and intact distal pulses.   No murmur heard. Respiratory: Effort normal and breath sounds normal. No respiratory distress. He has no wheezes.  GI: Soft. Bowel sounds are normal. He exhibits no distension.  Musculoskeletal: Normal range of motion. He exhibits no edema and no  tenderness.  Neurological: He is alert and oriented to person, place, and time. He has normal reflexes. No cranial nerve deficit. He exhibits normal muscle tone. Coordination normal.  Skin: Skin is warm and dry. No rash noted. No erythema. No pallor.  Psychiatric: He has a normal mood and affect. His behavior is normal. Judgment and thought content normal.     Assessment/Plan Type II odontoid fracture.  Plan posterior C1-C2 fusion with lateral mass instrumentation coupled with iliac crest autograft and interspinous wiring. Iliac crest bone harvest. Risks and benefits been explained. Patient wishes to proceed.  POOL,HENRY A 03/21/2012, 12:42 PM

## 2012-03-21 NOTE — Anesthesia Procedure Notes (Signed)
Procedure Name: Intubation Date/Time: 03/21/2012 1:03 PM Performed by: Jefm Miles E Pre-anesthesia Checklist: Patient identified, Timeout performed, Emergency Drugs available, Suction available and Patient being monitored Patient Re-evaluated:Patient Re-evaluated prior to inductionOxygen Delivery Method: Circle system utilized Preoxygenation: Pre-oxygenation with 100% oxygen Intubation Type: IV induction and Rapid sequence Ventilation: Mask ventilation without difficulty Laryngoscope Size: Mac and 3 Grade View: Grade II Tube type: Oral Tube size: 7.5 mm Number of attempts: 1 Airway Equipment and Method: Stylet Placement Confirmation: ETT inserted through vocal cords under direct vision,  breath sounds checked- equal and bilateral and positive ETCO2 Secured at: 23 cm Tube secured with: Tape Dental Injury: Teeth and Oropharynx as per pre-operative assessment

## 2012-03-21 NOTE — Anesthesia Postprocedure Evaluation (Signed)
Anesthesia Post Note  Patient: [name]  Procedure(s) Performed: Procedure(s) (LRB): POSTERIOR CERVICAL FUSION/FORAMINOTOMY LEVEL ONE (N/A)  Anesthesia type: general  Patient location: PACU  Post pain: Pain level controlled  Post assessment: Patient's Cardiovascular Status Stable  Last Vitals:  Filed Vitals:   03/21/12 1614  BP: 119/74  Pulse: 84  Temp:   Resp: 15    Post vital signs: Reviewed and stable  Level of consciousness: sedated  Complications: No apparent anesthesia complications

## 2012-03-21 NOTE — Anesthesia Preprocedure Evaluation (Addendum)
Anesthesia Evaluation  Patient identified by MRN, date of birth, ID band Patient awake    Reviewed: Allergy & Precautions, H&P , NPO status , Patient's Chart, lab work & pertinent test results  Airway Mallampati: I TM Distance: >3 FB Neck ROM: full    Dental  (+) Teeth Intact and Dental Advisory Given   Pulmonary asthma ,          Cardiovascular hypertension, + Peripheral Vascular Disease Rhythm:regular Rate:Normal     Neuro/Psych    GI/Hepatic hiatal hernia, PUD, GERD-  ,(+) Cirrhosis -       ,   Endo/Other    Renal/GU      Musculoskeletal   Abdominal   Peds  Hematology   Anesthesia Other Findings ETOH Abuse  Reproductive/Obstetrics                          Anesthesia Physical Anesthesia Plan  ASA: III  Anesthesia Plan: General   Post-op Pain Management:    Induction: Intravenous  Airway Management Planned: Oral ETT  Additional Equipment:   Intra-op Plan:   Post-operative Plan: Extubation in OR  Informed Consent: I have reviewed the patients History and Physical, chart, labs and discussed the procedure including the risks, benefits and alternatives for the proposed anesthesia with the patient or authorized representative who has indicated his/her understanding and acceptance.     Plan Discussed with: CRNA, Anesthesiologist and Surgeon  Anesthesia Plan Comments:         Anesthesia Quick Evaluation

## 2012-03-21 NOTE — Op Note (Signed)
Date of procedure: 03/21/2012  Date of dictation: Same  Service: Neurosurgery  Preoperative diagnosis: Type II odontoid fracture  Postoperative diagnosis: Same  Procedure Name: Posterior C1/C2 fusion with nonsegmental instrumentation iliac crest autograft and interspinous wiring.  Right iliac crest bone harvest.  Surgeon:Henry A.Pool, M.D.  Asst. Surgeon: Phoebe Perch  Anesthesia: General  Indication: 67 year old male status post previous fall and resultant C2 type II odontoid fracture. Patient was managed nonoperatively by another surgeon. Fracture is failed to heal. Patient presents now for posterior cervical fusion at C1/C2.  Operative note: After induction of anesthesia, patient positioned prone onto bolsters and his head was fixed in a [place] pin headrest with his neck in a neutral position. Patient is appropriately padded. Posterior cervical and right iliac region were prepped and draped sterilely. Incision was made overlying the right iliac wing. This is carried down sharply through the fascia and the right iliac wing was exposed just lateral to the posterior superior iliac spine. A tricortical piece of ileum was resected using osteotomes. The iliac harvest site was irrigated with and bike solution and then waxed for hemostasis. Wound is then closed in a typical fashion.  Attention placed to the posterior cervical region. Incision was made from the occiput down to proximally C3. Supper off dissection then performed exposing the suboccipital bone lamina of C1 lamina and facet joints of C2 and C3. Self-retaining traction was placed intraoperative fluoroscopy is used and good position of the fracture site was ensured. Dissection proceeded far laterally at C1 exposing the exiting C2 nerve root along the way. Bothersome epidural bleeding was encountered and eventually controlled using Gelfoam and bipolar electrocautery. Entry sites for C1 lateral mass screws were identified. The C2 nerve root  was mobilized and retracted inferiorly. Pilot holes were drilled overlying these lateral mass of C2 as confirmed by both surface landmarks and intraoperative fluoroscopy. A hand used to drill through the lateral mass of C1 under fluoroscopic guidance. This was done to the anterior cortical margin of C1. 30 mm vertex partially-threaded screws were then passed into the lateral mass of C1 bilaterally. Good purchase of bone was achieved. Entry sites for pars interarticularis screws of C2 were determined by surface landmarks and intraoperative fluoroscopy. Once again pilot holes were drilled. Hand drill was then used to drill and appropriate length. 18 mm Vertex lateral mass screws were then placed bilaterally once again with good purchase of bone. Ligament was removed between C1 and C2. A single Songer titanium cable was then passed in a sub-laminar fashion under the lamina of C1. Iliac crest bone graft was fashioned to fit between the spinous processes of C1 and C2. The spinous processes of C1 and C2 were decorticated. The spinous processes were then fixated using a Songer cable by looping over the spinous process and a manner described by Dr. Loura Pardon the graft and the cables were secured in place and the cable was given a final cramping and tightening. Excess was trimmed away. Short segment titanium rod was placed over the screw heads at C1 and C2. Locking caps and placed over the screws were locking caps were then engaged with the construct under compression. Final images revealed good position of the bone graft and hardware at the proper operative level with normal lamina spine. Wound is irrigated one final time. Medium Hemovac drain was left in the epidural space. Wounds and closed in layers with Vicryl sutures. Steri-Strips and sterile dressing were applied. There were no apparent complications. Patient tolerated procedure well and returned  to the recovery postop.

## 2012-03-21 NOTE — Brief Op Note (Signed)
03/21/2012  3:37 PM  PATIENT:  [name]  67 y.o. male  PRE-OPERATIVE DIAGNOSIS:  Type two Odontoid fracture  POST-OPERATIVE DIAGNOSIS:  Type two Odontoid fracture  PROCEDURE:  Procedure(s) with comments: POSTERIOR CERVICAL FUSION/FORAMINOTOMY LEVEL ONE (N/A) - POSTERIOR CERVICAL ONE TO CERVICAL TWO FUSION WITH ILIAC CREST GRAFT AND LATERAL MASS SCREWS   SURGEON:  Surgeon(s) and Role:    * Temple Pacini, MD - Primary    * Clydene Fake, MD - Assisting  PHYSICIAN ASSISTANT:   ASSISTANTS:    ANESTHESIA:   general  EBL:  Total I/O In: 2200 [I.V.:1000; Blood:700; IV Piggyback:500] Out: 2000 [Blood:2000]  BLOOD ADMINISTERED:2 units CC PRBC  DRAINS: (Medium) Hemovact drain(s) in the Epidural space with  Suction Open   LOCAL MEDICATIONS USED:  MARCAINE     SPECIMEN:  No Specimen  DISPOSITION OF SPECIMEN:  N/A  COUNTS:  YES  TOURNIQUET:  * No tourniquets in log *  DICTATION: .Dragon Dictation  PLAN OF CARE: Admit to inpatient   PATIENT DISPOSITION:  PACU - hemodynamically stable.   Delay start of Pharmacological VTE agent (>24hrs) due to surgical blood loss or risk of bleeding: yes

## 2012-03-22 DIAGNOSIS — S42413A Displaced simple supracondylar fracture without intercondylar fracture of unspecified humerus, initial encounter for closed fracture: Secondary | ICD-10-CM

## 2012-03-22 DIAGNOSIS — S22009A Unspecified fracture of unspecified thoracic vertebra, initial encounter for closed fracture: Secondary | ICD-10-CM

## 2012-03-22 DIAGNOSIS — W19XXXA Unspecified fall, initial encounter: Secondary | ICD-10-CM

## 2012-03-22 LAB — BASIC METABOLIC PANEL
BUN: 29 mg/dL — ABNORMAL HIGH (ref 6–23)
Calcium: 9.2 mg/dL (ref 8.4–10.5)
Creatinine, Ser: 1.25 mg/dL (ref 0.50–1.35)
GFR calc Af Amer: 68 mL/min — ABNORMAL LOW (ref >90)
GFR calc non Af Amer: 59 mL/min — ABNORMAL LOW (ref >90)
Glucose, Bld: 158 mg/dL — ABNORMAL HIGH (ref 70–99)

## 2012-03-22 LAB — CBC
HCT: 24.6 % — ABNORMAL LOW (ref 39.0–52.0)
Hemoglobin: 8.8 g/dL — ABNORMAL LOW (ref 13.0–17.0)
MCH: 27.8 pg (ref 26.0–34.0)
MCHC: 35.8 g/dL (ref 30.0–36.0)
MCV: 77.8 fL — ABNORMAL LOW (ref 78.0–100.0)
RDW: 15.7 % — ABNORMAL HIGH (ref 11.5–15.5)

## 2012-03-22 LAB — TYPE AND SCREEN: Unit division: 0

## 2012-03-22 NOTE — Progress Notes (Signed)
Patient not in need of intense inpt rehab at this time. Recommend home with home health and 24/7 assist of his wife or return to SNF. [phone]

## 2012-03-22 NOTE — Progress Notes (Signed)
Rehab Admissions Coordinator Note:  Patient was screened by Trish Mage for appropriateness for an Inpatient Acute Rehab Consult.  At this time, an inpatient rehab consult is pending completion today.  Will defer prescreen since rehab consult has already been ordered.  Trish Mage 03/22/2012, 11:09 AM  I can be reached at [phone]

## 2012-03-22 NOTE — Progress Notes (Signed)
Called to room.  Patient says the top of his head down to his neck is now numb.  Patient recently medicated for pain.  No other signs of distress.  Paged Dr. Jordan Likes and awaiting instructions/.  Will continue to monitor.  Lance Bosch, RN

## 2012-03-22 NOTE — Consult Note (Signed)
Physical Medicine and Rehabilitation Consult  Reason for Consult: Odontoid fracture with poor healing Referring Physician:  Dr. Jordan Likes   HPI: [name] is a 67 y.o. male with history of HTN, alcoholic liver disease, cognitive deficits, left humerus fracture ( d/c from [facility] 2/14), nondisplaced type 2 odontoid fracture 11/13 with poor healing. He was admitted on 03/21/12 for C1/C2 fusion with iliac crest graft. PT evaluation done today and patient with impaired balance and impaired judgement with poor safety--cognition at baseline per reports.  Reports will not use a walker ( And refused to use AD at SNF-"it get's in my way"). MD, PT recommending CIR.   Review of Systems  HENT: Negative for hearing loss.   Eyes: Negative for blurred vision and double vision.  Respiratory: Positive for shortness of breath (with exertion.).   Cardiovascular: Negative for chest pain and palpitations.  Gastrointestinal: Negative for abdominal pain.  Musculoskeletal: Positive for myalgias.  Neurological: Positive for sensory change (Numbness/tingling bilateral hands in am.). Negative for headaches.  Psychiatric/Behavioral: Positive for memory loss.   Past Medical History  Diagnosis Date  . HLD (hyperlipidemia) 5/99  . HTN (hypertension) 5/99  . BPH (benign prostatic hypertrophy) 5/99  . GERD (gastroesophageal reflux disease) 07/31/04    gastritis   . Anxiety   . Allergy     pollen/ragweed  . Adenomatous polyp of colon 2006  . Hearing difficulty   . Humerus fracture 11/02/2011    LUE  . Peripheral vascular disease   . Pneumonia 1996    hosp  . Chronic bronchitis     "q year or q other year" (11/02/2011)  . History of blood transfusion 2012  . Anemia 2012  . Hernia, umbilical     "unrepaired" (11/02/2011)  . H/O hiatal hernia   . Memory loss     "recently has been forgetting alot; maybe related to the alcohol" (11/02/2011)  . Falls frequently     "daily lately; legs just give out" (11/02/2011)   . H/O nausea   . Depression     Hx of  . Shortness of breath     with exertion  . Seasonal allergies    Past Surgical History  Procedure Laterality Date  . L duputryen contractive surgery    . Neck mri  6/04    C5-6 disc abnormality  . Colonoscopy  07/31/04    multiple polyps; repeat in 3 years  . Hosp cp r/o'd 2/24  03/08/07  . Ett myoview  03/09/07    nml EF 66%  . Upper gastrointestinal endoscopy    . Cataract extraction w/ intraocular lens  implant, bilateral  10/2002  . Tonsillectomy      "I was young" (11/02/2011)  . Hemorrhoid surgery      "cauterized a long time ago" (11/02/2011)  . Esophageal varice ligation  2012  . Orif humerus fracture  11/04/2011    Procedure: OPEN REDUCTION INTERNAL FIXATION (ORIF) PROXIMAL HUMERUS FRACTURE;  Surgeon: Budd Palmer, MD;  Location: [facility] OR;  Service: Orthopedics;  Laterality: Left;  . Humerus fracture surgery Left 11/04/11    with plate to the shoulder   Family History  Problem Relation Age of Onset  . Heart disease Father   . Diabetes Father   . Stroke Father   . Depression Mother   . Heart disease Brother   . Alcohol abuse Brother   . Liver disease Brother    Social History: Married.   reports that he has never smoked. He  has never used smokeless tobacco. He reports that he drinks about 540.0 ounces of alcohol per week--none for a few months.  He reports that he does not use illicit drugs.   Allergies  Allergen Reactions  . Nifedipine     REACTION: SWELLING 11/02/2011 pt denies this allergy.   Medications Prior to Admission  Medication Sig Dispense Refill  . B Complex-C-Folic Acid (VITA-BEE/C PO) Take 1 tablet by mouth daily.      . fexofenadine (ALLEGRA) 180 MG tablet Take 180 mg by mouth daily as needed. For allergies      . folic acid (FOLVITE) 1 MG tablet Take 1 mg by mouth daily.      [name] gabapentin (NEURONTIN) 300 MG capsule Take 300 mg by mouth 4 (four) times daily.       . Magnesium 500 MG TABS Take 500 mg by  mouth 2 (two) times daily.      . methocarbamol (ROBAXIN) 500 MG tablet Take 500 mg by mouth 3 (three) times daily as needed (for pain and spasms).      . Misc Natural Products (OSTEO BI-FLEX JOINT SHIELD PO) Take 2-3 tablets by mouth daily.      . Multiple Vitamins-Minerals (CERTA-VITE PO) Take 1 tablet by mouth daily.      [name] oxyCODONE (OXY IR/ROXICODONE) 5 MG immediate release tablet Take 10 mg by mouth every 6 (six) hours as needed for pain.      . pantoprazole (PROTONIX) 40 MG tablet Take 40 mg by mouth 2 (two) times daily.      . QUEtiapine (SEROQUEL) 100 MG tablet Take 150 mg by mouth at bedtime.      [name] spironolactone (ALDACTONE) 50 MG tablet Take 50 mg by mouth daily.      . Thiamine HCl (VITAMIN B-1) 100 MG tablet Take 100 mg by mouth daily.       [name] zolpidem (AMBIEN) 5 MG tablet Take 5 mg by mouth at bedtime as needed for sleep.      [name] albuterol (PROVENTIL) (5 MG/ML) 0.5% nebulizer solution Take 2.5 mg by nebulization every 2 (two) hours as needed for wheezing or shortness of breath.      . fluticasone (FLONASE) 50 MCG/ACT nasal spray Place 2 sprays into the nose as needed. For allergies        Home: Home Living Lives With: Spouse Available Help at Discharge: Family;Available PRN/intermittently Type of Home: House Home Access: Stairs to enter Home Layout: Two level;Bed/bath upstairs;1/2 bath on main level Alternate Level Stairs-Number of Steps: 13 Home Adaptive Equipment: Walker - rolling Additional Comments: pt uses RW half folded in shower as grab bar.  pt had been at [facility] for rehab November 2013 - February 2014.    Functional History: Prior Function Bath: Supervision/set-up Meal Prep: Maximal Light Housekeeping: Maximal Able to Take Stairs?: Yes Functional Status:  Mobility: Bed Mobility Bed Mobility: Supine to Sit;Sitting - Scoot to Edge of Bed Supine to Sit: 4: Min guard;With rails Sitting - Scoot to Edge of Bed: 5: Supervision Transfers Transfers: Sit to  Stand;Stand to Sit Sit to Stand: 4: Min assist;With upper extremity assist;From bed Stand to Sit: 4: Min assist;With upper extremity assist;To chair/3-in-1;With armrests Ambulation/Gait Ambulation/Gait Assistance: 4: Min assist Ambulation Distance (Feet): 100 Feet Assistive device: Rolling walker Ambulation/Gait Assistance Details: pt unsteady and needs safety cueing for RW use and attending to task.   Gait Pattern: Step-through pattern;Decreased stride length Stairs: No Wheelchair Mobility Wheelchair Mobility: No  ADL:  Cognition: Cognition Arousal/Alertness: Awake/alert Orientation Level: Oriented X4 Cognition Overall Cognitive Status: Impaired Area of Impairment: Attention;Memory;Safety/judgement;Awareness of errors;Following commands;Awareness of deficits;Problem solving;Executive functioning Arousal/Alertness: Awake/alert Orientation Level: Appears intact for tasks assessed Behavior During Session: [facility] for tasks performed Current Attention Level: Selective Memory Deficits: Decreased STM Following Commands: Follows multi-step commands inconsistently Safety/Judgement: Decreased safety judgement for tasks assessed;Impulsive;Decreased awareness of need for assistance Awareness of Errors: Assistance required to identify errors made;Assistance required to correct errors made Cognition - Other Comments: pt with hx of Etoh use and well known to this PT.  pt with cognitive deficits at baseline and this seems to be near baseline.    Blood pressure 126/65, pulse 93, temperature 97.9 F (36.6 C), temperature source Oral, resp. rate 18, height 5\' 9"  (1.753 m), weight 88 kg (194 lb 0.1 oz), SpO2 99.00%. Physical Exam  Nursing note and vitals reviewed. Constitutional: He is oriented to person, place, and time. He appears well-developed and well-nourished.  HENT:  Head: Normocephalic and atraumatic.  Eyes: Pupils are equal, round, and reactive to light.  Neck: Normal range of motion.   Cardiovascular: Normal rate and regular rhythm.   Pulmonary/Chest: Effort normal and breath sounds normal.  Abdominal: Soft. Bowel sounds are normal.  Musculoskeletal: He exhibits no edema.  Neurological: He is alert and oriented to person, place, and time.  Skin: Skin is warm and dry.  Psychiatric: His speech is normal. He expresses impulsivity.  5/5 strength in bilateral biceps triceps grip hip flexor knee extensor ankle dorsiflexor 3 minus left deltoid secondary to range of motion restrictions Sensation is normal in upper and lower limbs Mood and affect are appropriate  Results for orders placed during the hospital encounter of 03/21/12 (from the past 24 hour(s))  PREPARE RBC (CROSSMATCH)     Status: None   Collection Time    03/21/12  2:45 PM      Result Value Range   Order Confirmation ORDER PROCESSED BY BLOOD BANK    CBC     Status: Abnormal   Collection Time    03/22/12  7:26 AM      Result Value Range   WBC 11.2 (*) 4.0 - 10.5 K/uL   RBC 3.16 (*) 4.22 - 5.81 MIL/uL   Hemoglobin 8.8 (*) 13.0 - 17.0 g/dL   HCT 78.4 (*) 69.6 - 29.5 %   MCV 77.8 (*) 78.0 - 100.0 fL   MCH 27.8  26.0 - 34.0 pg   MCHC 35.8  30.0 - 36.0 g/dL   RDW 28.4 (*) 13.2 - 44.0 %   Platelets 127 (*) 150 - 400 K/uL  BASIC METABOLIC PANEL     Status: Abnormal   Collection Time    03/22/12  7:26 AM      Result Value Range   Sodium 135  135 - 145 mEq/L   Potassium 5.0  3.5 - 5.1 mEq/L   Chloride 102  96 - 112 mEq/L   CO2 24  19 - 32 mEq/L   Glucose, Bld 158 (*) 70 - 99 mg/dL   BUN 29 (*) 6 - 23 mg/dL   Creatinine, Ser 1.02  0.50 - 1.35 mg/dL   Calcium 9.2  8.4 - 72.5 mg/dL   GFR calc non Af Amer 59 (*) >90 mL/min   GFR calc Af Amer 68 (*) >90 mL/min   Dg Cervical Spine 2-3 Views  03/21/2012  *RADIOLOGY REPORT*  Clinical Data: Odontoid fracture.  CERVICAL SPINE - 2-3 VIEW  Comparison: CT on 02/21/2012  Findings: There has been placement of pedicle screws and posterior fixation rods as well as a  posterior cerclage wire at C1-2. Alignment is anatomic.  IMPRESSION: Posterior fusion of C1-2 in anatomic alignment.   Original Report Authenticated By: Myles Rosenthal, M.D.     Assessment/Plan: Diagnosis: Odontoid fracture postoperative day #1 status post C1-C2 fusion 1. Does the need for close, 24 hr/day medical supervision in concert with the patient's rehab needs make it unreasonable for this patient to be served in a less intensive setting? No 2. Co-Morbidities requiring supervision/potential complications: Left shoulder contracture 3. Due to safety, skin/wound care and pain management, does the patient require 24 hr/day rehab nursing? No 4. Does the patient require coordinated care of a physician, rehab nurse, not applicable to address physical and functional deficits in the context of the above medical diagnosis(es)? No Addressing deficits in the following areas: balance, endurance, locomotion, transferring, bathing and dressing 5. Can the patient actively participate in an intensive therapy program of at least 3 hrs of therapy per day at least 5 days per week? Yes 6. The potential for patient to make measurable gains while on inpatient rehab is not applicable 7. Anticipated functional outcomes upon discharge from inpatient rehab are Not applicable with PT, Not applicable with OT, Not applicable with SLP. 8. Estimated rehab length of stay to reach the above functional goals is: Not applicable 9. Does the patient have adequate social supports to accommodate these discharge functional goals? Potentially 10. Anticipated D/C setting: Home 11. Anticipated post D/C treatments: HH therapy 12. Overall Rehab/Functional Prognosis: excellent  RECOMMENDATIONS: This patient's condition is appropriate for continued rehabilitative care in the following setting: [facility] Patient has agreed to participate in recommended program. Potentially Note that insurance prior authorization may be required for reimbursement  for recommended care.  Comment:If patient does not have supervision at home and then SNF would be appropriate. Patient is too high level for CIR    03/22/2012

## 2012-03-22 NOTE — Evaluation (Signed)
Physical Therapy Evaluation Patient Details Name: [name] MRN: [id number] DOB: 08-Dec-1945 Today's Date: 03/22/2012 Time: 0812-0850 PT Time Calculation (min): 38 min  PT Assessment / Plan / Recommendation Clinical Impression  pt presents with C1-2 Fusion 2/2 fx nonunion.  pt with cognitive deficits and would benefit from CIR at D/C to maximize Independence prior to return home.      PT Assessment  Patient needs continued PT services    Follow Up Recommendations  CIR    Does the patient have the potential to tolerate intense rehabilitation      Barriers to Discharge None      Equipment Recommendations  Rolling walker with 5" wheels    Recommendations for Other Services Rehab consult;OT consult   Frequency Min 5X/week    Precautions / Restrictions Precautions Precautions: Fall;Cervical Required Braces or Orthoses: Cervical Brace Cervical Brace: Hard collar Restrictions Weight Bearing Restrictions: No   Pertinent Vitals/Pain 7/10.  RN medicated during mobility.        Mobility  Bed Mobility Bed Mobility: Supine to Sit;Sitting - Scoot to Edge of Bed Supine to Sit: 4: Min guard;With rails Sitting - Scoot to Edge of Bed: 5: Supervision Details for Bed Mobility Assistance: pt utilizes rails to prevent need for A from PT.   Transfers Transfers: Sit to Stand;Stand to Sit Sit to Stand: 4: Min assist;With upper extremity assist;From bed Stand to Sit: 4: Min assist;With upper extremity assist;To chair/3-in-1;With armrests Details for Transfer Assistance: pt mildly unsteady.  Cues for UE use, getting closer to chair.   Ambulation/Gait Ambulation/Gait Assistance: 4: Min assist Ambulation Distance (Feet): 100 Feet Assistive device: Rolling walker Ambulation/Gait Assistance Details: pt unsteady and needs safety cueing for RW use and attending to task.   Gait Pattern: Step-through pattern;Decreased stride length Stairs: No Wheelchair Mobility Wheelchair Mobility: No     Exercises     PT Diagnosis: Difficulty walking;Acute pain  PT Problem List: Decreased activity tolerance;Decreased balance;Decreased mobility;Decreased coordination;Decreased cognition;Decreased knowledge of use of DME;Decreased knowledge of precautions;Decreased safety awareness;Pain PT Treatment Interventions: DME instruction;Gait training;Stair training;Functional mobility training;Therapeutic activities;Therapeutic exercise;Balance training;Neuromuscular re-education;Cognitive remediation;Patient/family education   PT Goals Acute Rehab PT Goals PT Goal Formulation: With patient Time For Goal Achievement: 04/05/12 Potential to Achieve Goals: Good Pt will Roll Supine to Right Side: with modified independence PT Goal: Rolling Supine to Right Side - Progress: Goal set today Pt will Roll Supine to Left Side: with modified independence PT Goal: Rolling Supine to Left Side - Progress: Goal set today Pt will go Supine/Side to Sit: with modified independence PT Goal: Supine/Side to Sit - Progress: Goal set today Pt will go Sit to Supine/Side: with modified independence PT Goal: Sit to Supine/Side - Progress: Goal set today Pt will go Sit to Stand: with modified independence PT Goal: Sit to Stand - Progress: Goal set today Pt will go Stand to Sit: with modified independence PT Goal: Stand to Sit - Progress: Goal set today Pt will Ambulate: >150 feet;with modified independence;with least restrictive assistive device PT Goal: Ambulate - Progress: Goal set today Pt will Go Up / Down Stairs: Flight;with supervision;with rail(s) PT Goal: Up/Down Stairs - Progress: Goal set today  Visit Information  Last PT Received On: 03/22/12 Assistance Needed: +1    Subjective Data  Subjective: The first fall was my own stupidity.  The second is because [facility] dumped me out of the W/C.   Patient Stated Goal: Per wife- Rehab   Prior Functioning  Home Living Lives With:  Spouse Available Help at  Discharge: Family;Available PRN/intermittently Type of Home: House Home Access: Stairs to enter Home Layout: Two level;Bed/bath upstairs;1/2 bath on main level Alternate Level Stairs-Number of Steps: 13 Home Adaptive Equipment: Walker - rolling Additional Comments: pt uses RW half folded in shower as grab bar.  pt had been at [facility] for rehab November 2013 - February 2014.   Prior Function Level of Independence: Needs assistance Needs Assistance: Bathing;Meal Prep;Light Housekeeping Bath: Supervision/set-up Meal Prep: Maximal Light Housekeeping: Maximal Able to Take Stairs?: Yes Communication Communication: No difficulties    Cognition  Cognition Overall Cognitive Status: Impaired Area of Impairment: Attention;Memory;Safety/judgement;Awareness of errors;Following commands;Awareness of deficits;Problem solving;Executive functioning Arousal/Alertness: Awake/alert Orientation Level: Appears intact for tasks assessed Behavior During Session: [facility] for tasks performed Current Attention Level: Selective Memory Deficits: Decreased STM Following Commands: Follows multi-step commands inconsistently Safety/Judgement: Decreased safety judgement for tasks assessed;Impulsive;Decreased awareness of need for assistance Awareness of Errors: Assistance required to identify errors made;Assistance required to correct errors made Cognition - Other Comments: pt with hx of Etoh use and well known to this PT.  pt with cognitive deficits at baseline and this seems to be near baseline.      Extremity/Trunk Assessment Right Lower Extremity Assessment RLE ROM/Strength/Tone: WFL for tasks assessed RLE Sensation: WFL - Light Touch Left Lower Extremity Assessment LLE ROM/Strength/Tone: WFL for tasks assessed LLE Sensation: WFL - Light Touch Trunk Assessment Trunk Assessment: Normal   Balance Balance Balance Assessed: No  End of Session PT - End of Session Equipment Utilized During Treatment: Gait  belt;Cervical collar Activity Tolerance: Patient tolerated treatment well Patient left: in chair;with call bell/phone within reach;with family/visitor present Nurse Communication: Mobility status  GP     Sunny Schlein, [place] [phone] 03/22/2012, 9:02 AM

## 2012-03-22 NOTE — Progress Notes (Signed)
[facility]  Patient Status: Active (receiving services up to time of hospitalization)  [facility] is providing the following services: OT  If patient discharges after hours, please call [phone].   Kizzie Furnish 03/22/2012, 5:08 PM

## 2012-03-22 NOTE — Evaluation (Signed)
Occupational Therapy Evaluation Patient Details Name: KHADIR ROAM MRN: [id number] DOB: 12/31/1945 Today's Date: 03/22/2012 Time: 1042-1100 OT Time Calculation (min): 18 min  OT Assessment / Plan / Recommendation Clinical Impression  This 67 yo male admitted for Posterior C1/C2 fusion presents to acute OT with problems below. Will benefit from acute OT with follow up OT on CIR.    OT Assessment  Patient needs continued OT Services    Follow Up Recommendations  CIR    Barriers to Discharge Decreased caregiver support    Equipment Recommendations  None recommended by OT       Frequency  Min 2X/week    Precautions / Restrictions Precautions Precautions: Fall;Cervical Required Braces or Orthoses: Cervical Brace Cervical Brace: Hard collar Restrictions Weight Bearing Restrictions: No   Pertinent Vitals/Pain No pain    ADL  Eating/Feeding: Simulated;Independent Where Assessed - Eating/Feeding: Chair Grooming: Simulated;Set up Where Assessed - Grooming: Unsupported sitting Upper Body Bathing: Simulated;Set up Where Assessed - Upper Body Bathing: Unsupported sitting Lower Body Bathing: Simulated;Min guard Where Assessed - Lower Body Bathing: Unsupported sit to stand Upper Body Dressing: Simulated;Set up Where Assessed - Upper Body Dressing: Unsupported sitting Lower Body Dressing: Simulated;Min guard Where Assessed - Lower Body Dressing: Unsupported sit to stand Toilet Transfer: Simulated;Min guard Acupuncturist:  (Recliner, 7 steps forward and back, recliner) Toileting - Clothing Manipulation and Hygiene: Simulated;Min guard Where Assessed - Toileting Clothing Manipulation and Hygiene: Sit to stand from 3-in-1 or toilet Equipment Used:  (None) Transfers/Ambulation Related to ADLs: S sit to stand and stand to sit, min guard A for ambulation without AD ADL Comments: PT can cross his legs to get to his feet    OT Diagnosis: Generalized weakness;Cognitive  deficits  OT Problem List: Decreased range of motion;Decreased cognition;Impaired balance (sitting and/or standing);Decreased safety awareness;Impaired UE functional use OT Treatment Interventions: Self-care/ADL training;Therapeutic activities;Therapeutic exercise;Cognitive remediation/compensation;DME and/or AE instruction;Patient/family education;Balance training   OT Goals Acute Rehab OT Goals OT Goal Formulation: With patient Time For Goal Achievement: 03/29/12 Potential to Achieve Goals: Good ADL Goals Pt Will Perform Grooming: with supervision;Unsupported;Standing at sink ADL Goal: Grooming - Progress: Goal set today Pt Will Transfer to Toilet: with supervision;Ambulation;Comfort height toilet;Grab bars;3-in-1 ADL Goal: Toilet Transfer - Progress: Goal set today Arm Goals Pt Will Perform AROM: with supervision, verbal cues required/provided;Left upper extremity;1 set;10 reps;to maintain range of motion Arm Goal: AROM - Progress: Goal set today Miscellaneous OT Goals Miscellaneous OT Goal #1: Pt will safely get around his room without need for cues OT Goal: Miscellaneous Goal #1 - Progress: Goal set today Miscellaneous OT Goal #2: Pt's wife will be aware of how to don/doff collar and change out pads (if they have them--collar from home) OT Goal: Miscellaneous Goal #2 - Progress: Goal set today  Visit Information  Last OT Received On: 03/22/12 Assistance Needed: +1    Subjective Data  Patient Stated Goal: I would like to go to rehab here   Prior Functioning     Home Living Lives With: Spouse Available Help at Discharge: Family;Available PRN/intermittently Type of Home: House Home Access: Stairs to enter Home Layout: Two level;Bed/bath upstairs;1/2 bath on main level Alternate Level Stairs-Number of Steps: 13 Bathroom Toilet: Standard Home Adaptive Equipment: Shower chair with back;Walker - rolling (has a HH shower head) Additional Comments: pt uses RW half folded in  shower as grab bar.  pt had been at [facility] for rehab November 2013 - February 2014.   Prior Function Level of  Independence: Needs assistance Needs Assistance: Bathing;Meal Prep;Light Housekeeping Bath: Supervision/set-up Meal Prep: Maximal Light Housekeeping: Maximal Able to Take Stairs?: Yes Driving: No Communication Communication: No difficulties (He does talk alot) Dominant Hand: Right         Vision/Perception Vision - History Baseline Vision: No visual deficits   Cognition  Cognition Overall Cognitive Status: Impaired (h/o, ? if at baseline or worse, per PT close to baseline) Area of Impairment: Attention;Memory;Following commands;Safety/judgement Arousal/Alertness: Awake/alert Orientation Level: Appears intact for tasks assessed (Used calendar without prompting for date) Behavior During Session: [facility] for tasks performed Current Attention Level: Selective Memory Deficits: Decreased STM Following Commands: Follows multi-step commands inconsistently (probably due to STM deficits) Safety/Judgement: Impulsive Awareness of Errors: Assistance required to identify errors made;Assistance required to correct errors made Cognition - Other Comments: pt with hx of Etoh, pt reports that he does have STM deficits and thus he will write stuff down.    Extremity/Trunk Assessment Right Upper Extremity Assessment RUE ROM/Strength/Tone: Within functional levels Left Upper Extremity Assessment LUE ROM/Strength/Tone: Deficits LUE ROM/Strength/Tone Deficits: Decreased AROM at shoulder due to old injury, has been having therapy for it for several months now, rest [facility] (he has had surgery on his hand many years ago)     Mobility Bed Mobility Details for Bed Mobility Assistance: Pt up in recliner upon my arrival Transfers Transfers: Sit to Stand;Stand to Sit Sit to Stand: 5: Supervision;With upper extremity assist;With armrests;From chair/3-in-1 Stand to Sit: 5: Supervision;With upper  extremity assist;With armrests;To chair/3-in-1 Details for Transfer Assistance: pt mildly unsteady.  Cues for UE use, getting closer to chair.             End of Session OT - End of Session Activity Tolerance: Patient tolerated treatment well Patient left: in chair    Evette Georges [phone] 03/22/2012, 11:56 AM

## 2012-03-22 NOTE — Progress Notes (Signed)
Postop day 1. Overall doing well. No issues overnight. Pain well controlled. Denies any radiating pain. No numbness paresthesias or weakness. Preoperative left arm pain secondary to his humerus fracture unchanged.  Afebrile. Vital stable. Urine output good. Voiding without difficulty. Awake and alert. Oriented and appropriate. Motor and sensory function intact. Dressing dry. Chest and abdomen benign.  Progressing well following C1-C2 fusion. Continue efforts at mobilization with therapy. We will get a rehabilitation medicine consult with regard to the feasibility of inpatient rehabilitation.

## 2012-03-22 NOTE — Progress Notes (Signed)
UR COMPLETED  

## 2012-03-22 NOTE — Clinical Documentation Improvement (Signed)
Anemia Blood Loss Clarification  THIS DOCUMENT IS NOT A PERMANENT PART OF THE MEDICAL RECORD         03/22/12  Dear Dr.Pool,  In an effort to better capture your patient's severity of illness, reflect appropriate length of stay and utilization of resources, a review of the patient medical record has revealed the following indicators.   Based on your clinical judgment, please clarify and document in a progress note and/or discharge summary the clinical condition associated with the following supporting information: In responding to this query please exercise your independent judgment.  The fact that a query is asked, does not imply that any particular answer is desired or expected.   [name] was admitted for treatment of a type two Odontoid fracture. Per Cervical Fusion Operative Report: "EBL=21mls" and 2units of PRBCs administered intraoperatively. If possible, please help clarify the suspected diagnosis causing this abnormal lab value and treatment. Thank you!  Possible Clinical Conditions?  - Expected Intraoperative Acute Blood Loss Anemia  - Intraoperative Acute Blood Loss Anemia  - Other condition (please document in the progress notes and/or discharge summary)  - Cannot Clinically determine at this time       Reviewed: additional documentation in the medical record   Thank You,  Saul Fordyce  Clinical Documentation Specialist: [phone] Pager  Health Information [facility] [place]

## 2012-03-23 ENCOUNTER — Telehealth: Payer: Self-pay

## 2012-03-23 DIAGNOSIS — D62 Acute posthemorrhagic anemia: Secondary | ICD-10-CM

## 2012-03-23 NOTE — Progress Notes (Signed)
Postop day 2. A little bit more sore today but overall pain well controlled.  Afebrile. Vital stable. Urine output good. Drain output low. Awake and alert. Oriented and recently appropriate. Motor and sensory function extremities normal. Wound clean and dry. Chest and abdomen benign.  Status post C1/C2 fusion with instrumentation. Progressing well. Patient not felt to be a rehabilitation candidate. Overall from a functional standpoint patient ready for discharge. Significant cognitive/safety issues remain as a barrier towards home discharge however. I discussed situation with the patient. He reluctantly agrees to consider short-term skilled nursing facility placement again. I think this is her best option.

## 2012-03-23 NOTE — Telephone Encounter (Signed)
pts wife,[name] left v/m pt had back surgery on 03/21/12 at [facility] and plan is for pt to be discharged 03/24/12 to go for in patient rehab. Mrs Zeiss request call back from Dr Dayton Martes only.

## 2012-03-23 NOTE — Progress Notes (Signed)
I spoke to pt's wife by phone per her request to explain why pt not a candidate for inpt rehab at this time. Pt is minguard assist , but unsafe cognitively to be at home alone while his wife works. Wife is aware of his cognition issues and she is concerned of him being home alone. I have recommended to her home with [facility] and 24/7 supervision of family or SNF. I have referred her to the SW, [place].  [phone]
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
## 2012-03-24 MED ORDER — CYCLOBENZAPRINE HCL 10 MG PO TABS: 10.0000 mg | ORAL_TABLET | Freq: Three times a day (TID) | ORAL | Status: DC | PRN

## 2012-03-24 MED ORDER — OXYCODONE HCL 5 MG PO TABS: 10.0000 mg | ORAL_TABLET | Freq: Four times a day (QID) | ORAL | Status: DC | PRN
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
## 2012-03-24 NOTE — Discharge Summary (Signed)
Physician Discharge Summary  Patient ID: [name] MRN: [id number] DOB/AGE: Nov 04, 1945 67 y.o.  Admit date: 03/21/2012 Discharge date: 03/24/2012  Admission Diagnoses:  Discharge Diagnoses:  Active Problems:   C2 cervical fracture   ALCOHOL ABUSE   Unspecified hearing loss   HYPERTENSION   Anemia due to blood loss, acute   Discharged Condition: good  Hospital Course: Patient admitted for treatment of his type II odontoid fracture. Patient underwent posterior C1/C2 fusion with instrumentation. Postoperatively he is done well. He has the expected amount of incisional discomfort but has no new neurologic symptoms. He has chronic neck pain and arm pain secondary to previous injuries. These been managed well on his current medications. His wound is healing well. He remains in a in [place] cervical collar. He is tolerating a regular diet and his activity level is good with therapy. Plan is discharge to skilled nursing facility secondary to cognitive impairment.  Consults:   Significant Diagnostic Studies:   Treatments:   Discharge Exam: Blood pressure 114/73, pulse 94, temperature 98.4 F (36.9 C), temperature source Oral, resp. rate 18, height 5\' 9"  (1.753 m), weight 88 kg (194 lb 0.1 oz), SpO2 98.00%. Awake and alert. Oriented and appropriate. Counter function is intact. Motor and sensory function of the extremities normal. Wound clean and dry. Chest and abdomen benign.  Disposition: 01-Home or Self Care     Medication List    TAKE these medications       albuterol (5 MG/ML) 0.5% nebulizer solution  Commonly known as:  PROVENTIL  Take 2.5 mg by nebulization every 2 (two) hours as needed for wheezing or shortness of breath.     CERTA-VITE PO  Take 1 tablet by mouth daily.     fexofenadine 180 MG tablet  Commonly known as:  ALLEGRA  Take 180 mg by mouth daily as needed. For allergies     fluticasone 50 MCG/ACT nasal spray  Commonly known as:  FLONASE  Place 2 sprays  into the nose as needed. For allergies     folic acid 1 MG tablet  Commonly known as:  FOLVITE  Take 1 mg by mouth daily.     gabapentin 300 MG capsule  Commonly known as:  NEURONTIN  Take 300 mg by mouth 4 (four) times daily.     Magnesium 500 MG Tabs  Take 500 mg by mouth 2 (two) times daily.     methocarbamol 500 MG tablet  Commonly known as:  ROBAXIN  Take 500 mg by mouth 3 (three) times daily as needed (for pain and spasms).     OSTEO BI-FLEX JOINT SHIELD PO  Take 2-3 tablets by mouth daily.     oxyCODONE 5 MG immediate release tablet  Commonly known as:  Oxy IR/ROXICODONE  Take 2 tablets (10 mg total) by mouth every 6 (six) hours as needed for pain.     pantoprazole 40 MG tablet  Commonly known as:  PROTONIX  Take 40 mg by mouth 2 (two) times daily.     QUEtiapine 100 MG tablet  Commonly known as:  SEROQUEL  Take 150 mg by mouth at bedtime.     spironolactone 50 MG tablet  Commonly known as:  ALDACTONE  Take 50 mg by mouth daily.     thiamine 100 MG tablet  Commonly known as:  VITAMIN B-1  Take 100 mg by mouth daily.     VITA-BEE/C PO  Take 1 tablet by mouth daily.     zolpidem 5 MG  tablet  Commonly known as:  AMBIEN  Take 5 mg by mouth at bedtime as needed for sleep.           Follow-up Information   Follow up with [place] Willow A, MD. Call in 2 weeks. (Ask for Lurena Joiner)    Contact information:   [address] [place] [address] [phone]       Signed: Temple Pacini 03/24/2012, 3:43 PM

## 2012-03-24 NOTE — Clinical Social Work Psychosocial (Signed)
Clinical Social Work Department BRIEF PSYCHOSOCIAL ASSESSMENT 03/24/2012  Patient:  [name], [name]     Account Number:  [id number]     Admit date:  03/21/2012  Clinical Social Worker:  Peggyann Shoals  Date/Time:  03/23/2012 12:10 PM  Referred by:  Physician  Date Referred:  03/23/2012 Referred for  SNF Placement   Other Referral:   Interview type:  Family Other interview type:    PSYCHOSOCIAL DATA Living Status:  WIFE Admitted from facility:   Level of care:   Primary support name:  Pharmacist, hospital Primary support relationship to patient:  SPOUSE Degree of support available:   supportive    CURRENT CONCERNS Current Concerns  Post-Acute Placement   Other Concerns:    SOCIAL WORK ASSESSMENT / PLAN CSW spoke with pt's wife, as she was concerned that pt would need more support at home than she was able to provide. CSW explained that pt was not a candidate for CIR and PT is recommending SNF.    Pt has been to SNF in the past. Per MD, pt is agreeable and would like to go to the SNF he has been in the past which is [facility]. Pt's wife would like a search for all of [place].    CSW will initiate SNF referral for [facility] and follow up with bed offers.    CSW will meet with pt one on one to discuss discharge plans. CSW will continue to follow.   Assessment/plan status:  Psychosocial Support/Ongoing Assessment of Needs Other assessment/ plan:   Information/referral to community resources:   SNF List    PATIENT'S/FAMILY'S RESPONSE TO PLAN OF CARE: Pt's wife is agreeable to SNF at discharge.    Dede Query, MSW, Theresia Majors [phone]

## 2012-03-24 NOTE — Progress Notes (Signed)
Agree with PTA.    Megan Ritenour, PT [phone]  

## 2012-03-24 NOTE — Telephone Encounter (Signed)
Returned [name]'s call and spoke with her.  Edsel had surgery on 3/11.  She states he did well during and immediately after surgery.  She said yesterday he "did not look good."  She states the doctor assessed him and he was.  Does seem better today.  He is too functional for in patient rehab which upsets [name].  He doesn't want to go to SNF for rehab because that is where he fell and broke his vertebrae last time.  She will continue to talk to him and she will keep me posted.  I did advise talking to Child psychotherapist and doctor to express her concerns.

## 2012-03-24 NOTE — Clinical Social Work Placement (Addendum)
Clinical Social Work Department CLINICAL SOCIAL WORK PLACEMENT NOTE 03/24/2012  Patient:  [name], [name]  Account Number:  [id number] Admit date:  03/21/2012  Clinical Social Worker:  Peggyann Shoals  Date/time:  03/24/2012 11:07 AM  Clinical Social Work is seeking post-discharge placement for this patient at the following level of care:   SKILLED NURSING   (*CSW will update this form in Epic as items are completed)   03/23/2012  Patient/family provided with [facility] Department of Clinical Social Work's list of facilities offering this level of care within the geographic area requested by the patient (or if unable, by the patient's family).  03/23/2012  Patient/family informed of their freedom to choose among providers that offer the needed level of care, that participate in Medicare, Medicaid or managed care program needed by the patient, have an available bed and are willing to accept the patient.  03/23/2012  Patient/family informed of [facility]' ownership interest in [facility], as well as of the fact that they are under no obligation to receive care at this facility.  PASARR submitted to EDS on existing PASARR number received from EDS on   FL2 transmitted to all facilities in geographic area requested by pt/family on  03/23/2012 FL2 transmitted to all facilities within larger geographic area on   Patient informed that his/her managed care company has contracts with or will negotiate with  certain facilities, including the following:     Patient/family informed of bed offers received:  03/24/2012 Patient chooses bed at [facility]  Physician recommends and patient chooses bed at  SNF  Patient to be transferred to [facility] on 03/28/2012 Patient to be transferred to facility by [facility]  The following physician request were entered in Epic:   Additional Comments:  Dede Query, MSW, LCSWA [phone]

## 2012-03-24 NOTE — Clinical Social Work Note (Signed)
Clinical Social Work   CSW met with pt's wife and provided bed offers. Awaiting bed choice. CSW will continue to follow to facilitate discharge.   Dede Query, MSW, Theresia Majors [phone]

## 2012-03-24 NOTE — Progress Notes (Signed)
Attempted placing chair alarm on patient, but he became very furious with me saying "i have been without chair alarm since Tuesday so why do i have to call you if i want to get up, i give you your salary so i can do whatever i want." for that matter chair alarm was taken off from patient, but door opened to monitor him closely.

## 2012-03-25 ENCOUNTER — Encounter (HOSPITAL_COMMUNITY): Payer: Self-pay | Admitting: Neurosurgery

## 2012-03-25 NOTE — Progress Notes (Signed)
Patient ID: [name], male   DOB: [birth year], 67 y.o.   MRN: [id number] Stable, decrease of incisional pain. Discharge by dr Jordan Likes to a SNF

## 2012-03-25 NOTE — Clinical Social Work Note (Signed)
Clinical Social Worker spoke with patient's wife by phone regarding confirmation of SNF. Per wife, the options that are currently available are not preferred choices. Wife inquired about two facilities, [facility] and [facility]. CSW called both facilities. [facility] admissions coordinator will be in office Monday and CSW left voice message at [facility] place. CSW informed wife that if patient is medically stable, then a choice will need to come from the facilities that have availability. CSW will continue to follow.   [name] MSW, [employer] (Weekend Coverage) [phone]

## 2012-03-26 NOTE — Progress Notes (Signed)
Patient refusing chair and bed alarm. Explained to patient that pt is a high fall risk and needs bed/chair alarm. Pt still refused. Will cont to monitor pt.

## 2012-03-26 NOTE — Progress Notes (Signed)
Patient ID: [name], male   DOB: 1946-01-02, 67 y.o.   MRN: [id number] Subjective: Patient reports feeling well  Objective: Vital signs in last 24 hours: Temp:  [97.8 F (36.6 C)-98.3 F (36.8 C)] 98.3 F (36.8 C) (03/15 2148) Pulse Rate:  [86-95] 95 (03/15 2148) Resp:  [16-18] 16 (03/15 2148) BP: (104-126)/(60-76) 126/73 mmHg (03/15 2148) SpO2:  [99 %-100 %] 100 % (03/15 2148)  Intake/Output from previous day: 03/15 0701 - 03/16 0700 In: 443 [P.O.:440; I.V.:3] Out: -  Intake/Output this shift: Total I/O In: 360 [P.O.:360] Out: -   Wound:all clean and dry  Lab Results: No results found for this basename: WBC, HGB, HCT, PLT,  in the last 72 hours BMET No results found for this basename: NA, K, CL, CO2, GLUCOSE, BUN, CREATININE, CALCIUM,  in the last 72 hours  Studies/Results: No results found.  Assessment/Plan: Doing well. Definitely ready for release to SNF tomorrow. Wife will need to pick from the ones w beds available.   LOS: 5 days  as above   Reinaldo Meeker, MD 03/26/2012, 9:03 AM

## 2012-03-26 NOTE — Progress Notes (Signed)
Patient's wife is hoping for placement at either [facility] or [facility]	[place]. Neither facility has admissions on the weekend.  [facility] requested clarification re: possible withdrawal issues;  CSW discussed with patient's nurse- Riley Lam and she stated that patient is not having any withdrawal issues.  Not sent to [facility] via [facility] regarding this.  CSW will need to follow up on Monday with SNFS.  Lorri Frederick. [place]  [phone]

## 2012-03-27 NOTE — Progress Notes (Signed)
Physical Therapy Treatment Patient Details Name: [name] MRN: [id number] DOB: 11/05/1945 Today's Date: 03/27/2012 Time: 4098-1191 PT Time Calculation (min): 33 min  PT Assessment / Plan / Recommendation Comments on Treatment Session  Pt has good static standing balance with increased BOS, flexed hips & knees.  Pt unable to perform single leg stance, tandem stance or rhomberg stance.  Pt does not have good awareness of cervical precautions with movement. Additional practice with stairs recommended.    Follow Up Recommendations  SNF     Does the patient have the potential to tolerate intense rehabilitation     Barriers to Discharge        Equipment Recommendations  Rolling walker with 5" wheels    Recommendations for Other Services    Frequency     Plan      Precautions / Restrictions Precautions Precautions: Fall;Cervical Precaution Comments: Constant reminders for cervical precautions, pt unable to generalize in ADL or mobility. Required Braces or Orthoses: Cervical Brace Cervical Brace: Hard collar Restrictions Weight Bearing Restrictions: No   Pertinent Vitals/Pain Pt indicated he had not slept well.  He a reported his pain at 10/10.  He had pain medication around 6 am and would ask for more after session.  Pt willing to participate in PT.    Mobility  Bed Mobility Bed Mobility: Not assessed Transfers Transfers: Sit to Stand Sit to Stand: 5: Supervision;With armrests;With upper extremity assist;From chair/3-in-1 Stand to Sit: 5: Supervision;To chair/3-in-1;To toilet Details for Transfer Assistance: supervision for safety due to h/o falls Ambulation/Gait Ambulation/Gait Assistance: 4: Min guard Ambulation Distance (Feet): 200 Feet Ambulation/Gait Assistance Details: Pt has decreased trunk rotation, increased BOS, not always aware of obstacles, 1 seated rest, cues to maintain cervical precautions. Gait Pattern: Step-through pattern;Decreased trunk rotation;Wide  base of support;Trunk flexed Stairs: Yes Stairs Assistance: 4: Min guard Stair Management Technique: One rail Left;Sideways (Pt did not feel comfortable facing railing so discontinued.) Number of Stairs: 4 Wheelchair Mobility Wheelchair Mobility: No    Exercises     PT Diagnosis:    PT Problem List:   PT Treatment Interventions:     PT Goals Acute Rehab PT Goals Potential to Achieve Goals: Good Pt will go Sit to Stand: with modified independence PT Goal: Sit to Stand - Progress: Progressing toward goal Pt will Ambulate: >150 feet;with modified independence;with least restrictive assistive device PT Goal: Ambulate - Progress: Progressing toward goal Pt will Go Up / Down Stairs: Flight;with supervision;with rail(s) PT Goal: Up/Down Stairs - Progress: Progressing toward goal  Visit Information  Last PT Received On: 03/27/12 Assistance Needed: +1    Subjective Data  Subjective: Pt reported he had not slept well because of his pain.   Cognition  Cognition Overall Cognitive Status: Impaired Area of Impairment: Memory;Safety/judgement;Awareness of deficits;Problem solving Arousal/Alertness: Awake/alert Orientation Level: Appears intact for tasks assessed Behavior During Session: [facility] for tasks performed Memory: Decreased recall of precautions Following Commands: Follows multi-step commands inconsistently Safety/Judgement: Decreased awareness of safety precautions;Decreased safety judgement for tasks assessed;Impulsive;Decreased awareness of need for assistance Safety/Judgement - Other Comments: Pt does not appreciate his balance deficits/ fall risk. Awareness of Errors: Assistance required to identify errors made;Assistance required to correct errors made Awareness of Deficits: feels he is safe to walk alone and without a device    Balance  Balance Balance Assessed: Yes Static Standing Balance Static Standing - Balance Support: No upper extremity supported Static Standing -  Level of Assistance: 5: Stand by assistance Static Standing - Comment/#  of Minutes: 15  End of Session PT - End of Session Equipment Utilized During Treatment: Gait belt;Cervical collar Activity Tolerance: Patient tolerated treatment well Patient left: Other (comment) (with OT in room)   GP     Enid Baas, SPTA 03/27/2012, 11:09 AM

## 2012-03-27 NOTE — Progress Notes (Signed)
Verdell Face, [place] [phone] 03/27/2012

## 2012-03-27 NOTE — Progress Notes (Signed)
Occupational Therapy Treatment Patient Details Name: [name] MRN: [id number] DOB: [birth year] Today's Date: 03/27/2012 Time: 0102-7253 OT Time Calculation (min): 35 min  OT Assessment / Plan / Recommendation Comments on Treatment Session Pt continues to demonstrate poor awareness of safety and ability to generalize cervical precautions in mobility and ADL.    Follow Up Recommendations  SNF    Barriers to Discharge       Equipment Recommendations  None recommended by OT    Recommendations for Other Services    Frequency Min 2X/week   Plan Discharge plan needs to be updated    Precautions / Restrictions Precautions Precautions: Fall;Cervical Precaution Comments: Constant reminders for cervical precautions, pt unable to generalize in ADL or mobility. Required Braces or Orthoses: Cervical Brace Cervical Brace: Hard collar Restrictions Weight Bearing Restrictions: No   Pertinent Vitals/Pain Neck pain, did not rate, premedicated, repositioned    ADL  Grooming: Min guard;Wash/dry hands;Wash/dry face;Teeth care;Brushing hair Where Assessed - Grooming: Unsupported standing Upper Body Bathing: Minimal assistance (for back to prevent twisting) Where Assessed - Upper Body Bathing: Unsupported sitting Upper Body Dressing: Set up Where Assessed - Upper Body Dressing: Unsupported sitting Toilet Transfer: Supervision/safety Toilet Transfer Method: Sit to Barista: Comfort height toilet Toileting - Architect and Hygiene: Min guard Where Assessed - Engineer, mining and Hygiene: Standing Equipment Used: Gait belt Transfers/Ambulation Related to ADLs: min guard assist for ambulation and dynamic standing, supervision for static standing and stand to sit    OT Diagnosis:    OT Problem List:   OT Treatment Interventions:     OT Goals ADL Goals Pt Will Perform Grooming: with supervision;Unsupported;Standing at sink ADL Goal:  Grooming - Progress: Met Pt Will Transfer to Toilet: with supervision;Ambulation;Comfort height toilet;Grab bars;3-in-1 ADL Goal: Toilet Transfer - Progress: Progressing toward goals Arm Goals Pt Will Perform AROM: with supervision, verbal cues required/provided;Left upper extremity;1 set;10 reps;to maintain range of motion Miscellaneous OT Goals Miscellaneous OT Goal #1: Pt will safely get around his room without need for cues OT Goal: Miscellaneous Goal #1 - Progress: Not progressing Miscellaneous OT Goal #2: Pt's wife will be aware of how to don/doff collar and change out pads (if they have them--collar from home)  Visit Information  Last OT Received On: 03/27/12 Assistance Needed: +1 PT/OT Co-Evaluation/Treatment: Yes (part of visit)    Subjective Data      Prior Functioning       Cognition  Cognition Overall Cognitive Status: Impaired Area of Impairment: Memory;Safety/judgement;Awareness of deficits;Problem solving Arousal/Alertness: Awake/alert Orientation Level: Appears intact for tasks assessed Behavior During Session: [facility] for tasks performed Memory: Decreased recall of precautions Following Commands: Follows multi-step commands inconsistently Safety/Judgement: Decreased awareness of safety precautions;Decreased safety judgement for tasks assessed;Impulsive;Decreased awareness of need for assistance Safety/Judgement - Other Comments: Pt does not appreciate his balance deficits/ fall risk. Awareness of Errors: Assistance required to identify errors made;Assistance required to correct errors made    Mobility  Bed Mobility Bed Mobility: Not assessed Transfers Transfers: Sit to Stand;Stand to Sit Sit to Stand: 4: Min guard;From chair/3-in-1;From toilet Stand to Sit: 5: Supervision;To chair/3-in-1;To toilet    Exercises      Balance Balance Balance Assessed: Yes Static Standing Balance Static Standing - Balance Support: No upper extremity supported Static Standing  - Level of Assistance: 5: Stand by assistance Static Standing - Comment/# of Minutes: 15   End of Session OT - End of Session Activity Tolerance: Patient tolerated treatment well Patient left:  in chair;with call bell/phone within reach  GO     Evern Bio 03/27/2012, 10:08 AM [phone]

## 2012-03-27 NOTE — Progress Notes (Signed)
Filed Vitals:   03/26/12 1759 03/26/12 2117 03/27/12 1024 03/27/12 1344  BP: 132/82 131/70 110/72 110/73  Pulse: 96 94 93 85  Temp: 98.5 F (36.9 C) 98.2 F (36.8 C) 98.5 F (36.9 C) 98 F (36.7 C)  TempSrc: Oral Oral Oral Oral  Resp: 18  18 18   Height:      Weight:      SpO2: 97% 98% 97% 98%    Patient up and ambulating in room and halls without difficulty. Wound clean and dry. In [place] cervical collar. Case management services working on disposition with the patient and his wife.  Plan: Encouraged patient to continue to ambulate to build up his strength and stamina.  Hewitt Shorts, MD 03/27/2012, 3:48 PM

## 2012-03-28 DIAGNOSIS — IMO0002 Reserved for concepts with insufficient information to code with codable children: Secondary | ICD-10-CM | POA: Diagnosis not present

## 2012-03-28 DIAGNOSIS — Z9889 Other specified postprocedural states: Secondary | ICD-10-CM | POA: Diagnosis not present

## 2012-03-28 NOTE — Progress Notes (Signed)
Filed Vitals:   03/27/12 1836 03/27/12 2200 03/28/12 0200 03/28/12 0615  BP: 125/60 117/75 97/54 130/69  Pulse: 86 85 84 83  Temp: 98.9 F (37.2 C) 99.6 F (37.6 C) 98.2 F (36.8 C) 98.9 F (37.2 C)  TempSrc: Oral   Oral  Resp: 18 18 18 18   Height:      Weight:      SpO2: 100% 99% 95% 98%    Patient sitting up in chair, with complaints of mild discomfort. Incision clean and dry. Up and ambulating, typically with assistance or supervision. In [place] cervical collar. Disposition remains pending, Dr. Jordan Likes has dictated discharge summary and has placed a discharge order, pending arrangements for SNF.  Plan: Continue PT and OT. Have encouraged patient to ambulate to continue to buildup strength and stamina.  Hewitt Shorts, MD 03/28/2012, 8:44 AM

## 2012-03-28 NOTE — Clinical Social Work Note (Signed)
Clinical Social Work   Pt is ready for discharge to [facility]. Facility has received discharge summary and is ready to accept pt. Pt and family are agreeable to discharge plan. [facility] will provide transportation to facility. CSW is signing off as no further needs identified.   Dede Query, MSW, Theresia Majors [phone]

## 2012-03-28 NOTE — Progress Notes (Signed)
Physical Therapy Treatment Patient Details Name: [name] MRN: [id number] DOB: [birth year] Today's Date: 03/28/2012 Time: 1478-2956 PT Time Calculation (min): 15 min  PT Assessment / Plan / Recommendation Comments on Treatment Session  Patient progressing with ambulation. Patient stated that the MD told him he could walk without assistance. Patient still with some memory defitcits however pretty stable overall with mobility    Follow Up Recommendations  SNF     Does the patient have the potential to tolerate intense rehabilitation     Barriers to Discharge        Equipment Recommendations  Rolling walker with 5" wheels    Recommendations for Other Services    Frequency Min 3X/week   Plan Discharge plan needs to be updated;Frequency needs to be updated    Precautions / Restrictions     Pertinent Vitals/Pain     Mobility  Bed Mobility Bed Mobility: Not assessed Transfers Sit to Stand: 6: Modified independent (Device/Increase time) Stand to Sit: 6: Modified independent (Device/Increase time) Ambulation/Gait Ambulation/Gait Assistance: 5: Supervision Ambulation Distance (Feet): 300 Feet Assistive device: None Ambulation/Gait Assistance Details: NO rest breaks needed. Patient with slight swaying but no LOB Gait Pattern: Step-through pattern Stairs: Yes Stairs Assistance: 5: Supervision Stair Management Technique: One rail Left;Step to pattern;Forwards Number of Stairs: 4    Exercises     PT Diagnosis:    PT Problem List:   PT Treatment Interventions:     PT Goals Acute Rehab PT Goals PT Goal: Sit to Stand - Progress: Met PT Goal: Stand to Sit - Progress: Met PT Goal: Ambulate - Progress: Progressing toward goal PT Goal: Up/Down Stairs - Progress: Progressing toward goal  Visit Information  Last PT Received On: 03/28/12 Assistance Needed: +1    Subjective Data      Cognition  Cognition Overall Cognitive Status: Impaired Area of Impairment:  Memory;Safety/judgement Arousal/Alertness: Awake/alert Orientation Level: Appears intact for tasks assessed Behavior During Session: [facility] for tasks performed Following Commands: Follows multi-step commands inconsistently Safety/Judgement: Impulsive    Balance     End of Session PT - End of Session Equipment Utilized During Treatment: Gait belt;Cervical collar Activity Tolerance: Patient tolerated treatment well   GP     Fredrich Birks 03/28/2012, 2:17 PM 03/28/2012 Fredrich Birks PTA [phone] pager [phone] office

## 2012-03-29 DIAGNOSIS — D649 Anemia, unspecified: Secondary | ICD-10-CM | POA: Diagnosis not present

## 2012-03-29 DIAGNOSIS — I10 Essential (primary) hypertension: Secondary | ICD-10-CM | POA: Diagnosis not present

## 2012-03-29 DIAGNOSIS — E785 Hyperlipidemia, unspecified: Secondary | ICD-10-CM | POA: Diagnosis not present

## 2012-03-29 DIAGNOSIS — E559 Vitamin D deficiency, unspecified: Secondary | ICD-10-CM | POA: Diagnosis not present

## 2012-03-29 DIAGNOSIS — E039 Hypothyroidism, unspecified: Secondary | ICD-10-CM | POA: Diagnosis not present

## 2012-03-29 DIAGNOSIS — F411 Generalized anxiety disorder: Secondary | ICD-10-CM | POA: Diagnosis not present

## 2012-03-29 DIAGNOSIS — K219 Gastro-esophageal reflux disease without esophagitis: Secondary | ICD-10-CM | POA: Diagnosis not present

## 2012-03-31 ENCOUNTER — Telehealth: Payer: Self-pay | Admitting: Family Medicine

## 2012-03-31 MED ORDER — ZOLPIDEM TARTRATE 5 MG PO TABS: 5.0000 mg | ORAL_TABLET | Freq: Every evening | ORAL | Status: DC | PRN

## 2012-03-31 MED ORDER — QUETIAPINE FUMARATE 100 MG PO TABS: 150.0000 mg | ORAL_TABLET | Freq: Every day | ORAL | Status: DC

## 2012-03-31 NOTE — Telephone Encounter (Signed)
Spoke with wife.  She wants a month supply of both meds called to [place].

## 2012-03-31 NOTE — Telephone Encounter (Signed)
Medicines called to [place]. 

## 2012-03-31 NOTE — Telephone Encounter (Signed)
Ok send both.

## 2012-03-31 NOTE — Telephone Encounter (Signed)
Patient Information:  Caller Name: [name]  Phone: [phone]  Patient: [name], [name]  Gender: Male  DOB: 09/26/1945  Age: 67 Years  PCP: Ruthe Mannan [facility])  Office Follow Up:  Does the office need to follow up with this patient?: Yes  Instructions For The Office: Please call pts wife back and advise her.   Symptoms  Reason For Call & Symptoms: Pt had vertebrae surgery with a bone graft on 03/21/12. He then went to in- patient rehab  - he was to be sent home on 03/25/12. Pt d/c himself today against medical advice. His wife is calling to say that because he did that they did not send him home with some of his medications. She is asking MD to call her regarding this. He will need Seraquel and Ambien. His wife controls the pain meds and the Ambien right now and he has some pain meds left. She needs it prescribed in a way that she can control this. . Her cell number is [phone]  Reviewed Health History In EMR: Yes  Reviewed Medications In EMR: Yes  Reviewed Allergies In EMR: Yes  Reviewed Surgeries / Procedures: Yes  Date of Onset of Symptoms: 03/31/2012  Guideline(s) Used:  No Protocol Available - Sick Adult  Disposition Per Guideline:   Discuss with PCP and Callback by Nurse Today  Reason For Disposition Reached:   Nursing judgment  Advice Given:  N/A  Patient Will Follow Care Advice:  YES

## 2012-03-31 NOTE — Telephone Encounter (Signed)
We can refill his meds for one month only so she can control this a little easier.  We could even refill it week to week. That is up to her.

## 2012-04-02 ENCOUNTER — Emergency Department (HOSPITAL_COMMUNITY): Payer: Medicare Other

## 2012-04-02 ENCOUNTER — Emergency Department (HOSPITAL_COMMUNITY)
Admission: EM | Admit: 2012-04-02 | Discharge: 2012-04-02 | Disposition: A | Discharge status: Home / Self Care | Payer: Medicare Other | Attending: Emergency Medicine | Admitting: Emergency Medicine

## 2012-04-02 ENCOUNTER — Encounter (HOSPITAL_COMMUNITY): Payer: Self-pay | Admitting: *Deleted

## 2012-04-02 ENCOUNTER — Emergency Department (INDEPENDENT_AMBULATORY_CARE_PROVIDER_SITE_OTHER)
Admission: EM | Admit: 2012-04-02 | Discharge: 2012-04-02 | Disposition: A | Discharge status: Nursing Home (Includes ICF) | Payer: Medicare Other | Source: Home / Self Care | Attending: Emergency Medicine | Admitting: Emergency Medicine

## 2012-04-02 DIAGNOSIS — S199XXA Unspecified injury of neck, initial encounter: Secondary | ICD-10-CM | POA: Diagnosis not present

## 2012-04-02 DIAGNOSIS — Z8719 Personal history of other diseases of the digestive system: Secondary | ICD-10-CM | POA: Insufficient documentation

## 2012-04-02 DIAGNOSIS — F1010 Alcohol abuse, uncomplicated: Secondary | ICD-10-CM | POA: Insufficient documentation

## 2012-04-02 DIAGNOSIS — Y939 Activity, unspecified: Secondary | ICD-10-CM | POA: Insufficient documentation

## 2012-04-02 DIAGNOSIS — Z8639 Personal history of other endocrine, nutritional and metabolic disease: Secondary | ICD-10-CM | POA: Insufficient documentation

## 2012-04-02 DIAGNOSIS — Y92009 Unspecified place in unspecified non-institutional (private) residence as the place of occurrence of the external cause: Secondary | ICD-10-CM | POA: Diagnosis not present

## 2012-04-02 DIAGNOSIS — Z860100 Personal history of colon polyps, unspecified: Secondary | ICD-10-CM | POA: Insufficient documentation

## 2012-04-02 DIAGNOSIS — Z8669 Personal history of other diseases of the nervous system and sense organs: Secondary | ICD-10-CM | POA: Insufficient documentation

## 2012-04-02 DIAGNOSIS — Z87448 Personal history of other diseases of urinary system: Secondary | ICD-10-CM | POA: Insufficient documentation

## 2012-04-02 DIAGNOSIS — Z8781 Personal history of (healed) traumatic fracture: Secondary | ICD-10-CM | POA: Insufficient documentation

## 2012-04-02 DIAGNOSIS — W19XXXA Unspecified fall, initial encounter: Secondary | ICD-10-CM | POA: Diagnosis not present

## 2012-04-02 DIAGNOSIS — Z8659 Personal history of other mental and behavioral disorders: Secondary | ICD-10-CM | POA: Insufficient documentation

## 2012-04-02 DIAGNOSIS — W1809XA Striking against other object with subsequent fall, initial encounter: Secondary | ICD-10-CM | POA: Insufficient documentation

## 2012-04-02 DIAGNOSIS — J42 Unspecified chronic bronchitis: Secondary | ICD-10-CM | POA: Insufficient documentation

## 2012-04-02 DIAGNOSIS — S0180XA Unspecified open wound of other part of head, initial encounter: Secondary | ICD-10-CM | POA: Insufficient documentation

## 2012-04-02 DIAGNOSIS — Z8701 Personal history of pneumonia (recurrent): Secondary | ICD-10-CM | POA: Insufficient documentation

## 2012-04-02 DIAGNOSIS — S4980XA Other specified injuries of shoulder and upper arm, unspecified arm, initial encounter: Secondary | ICD-10-CM | POA: Diagnosis not present

## 2012-04-02 DIAGNOSIS — Z981 Arthrodesis status: Secondary | ICD-10-CM | POA: Diagnosis not present

## 2012-04-02 DIAGNOSIS — Z79899 Other long term (current) drug therapy: Secondary | ICD-10-CM | POA: Insufficient documentation

## 2012-04-02 DIAGNOSIS — S0993XA Unspecified injury of face, initial encounter: Secondary | ICD-10-CM | POA: Diagnosis not present

## 2012-04-02 DIAGNOSIS — R51 Headache: Secondary | ICD-10-CM | POA: Insufficient documentation

## 2012-04-02 DIAGNOSIS — S0990XA Unspecified injury of head, initial encounter: Secondary | ICD-10-CM

## 2012-04-02 DIAGNOSIS — Z8679 Personal history of other diseases of the circulatory system: Secondary | ICD-10-CM | POA: Insufficient documentation

## 2012-04-02 DIAGNOSIS — Z862 Personal history of diseases of the blood and blood-forming organs and certain disorders involving the immune mechanism: Secondary | ICD-10-CM | POA: Insufficient documentation

## 2012-04-02 DIAGNOSIS — K219 Gastro-esophageal reflux disease without esophagitis: Secondary | ICD-10-CM | POA: Insufficient documentation

## 2012-04-02 DIAGNOSIS — M79609 Pain in unspecified limb: Secondary | ICD-10-CM | POA: Diagnosis not present

## 2012-04-02 DIAGNOSIS — I10 Essential (primary) hypertension: Secondary | ICD-10-CM | POA: Insufficient documentation

## 2012-04-02 DIAGNOSIS — M25519 Pain in unspecified shoulder: Secondary | ICD-10-CM | POA: Diagnosis not present

## 2012-04-02 DIAGNOSIS — S0181XA Laceration without foreign body of other part of head, initial encounter: Secondary | ICD-10-CM

## 2012-04-02 DIAGNOSIS — IMO0002 Reserved for concepts with insufficient information to code with codable children: Secondary | ICD-10-CM | POA: Insufficient documentation

## 2012-04-02 DIAGNOSIS — M47812 Spondylosis without myelopathy or radiculopathy, cervical region: Secondary | ICD-10-CM | POA: Diagnosis not present

## 2012-04-02 MED ORDER — OXYCODONE-ACETAMINOPHEN 5-325 MG PO TABS: 1.0000 | ORAL_TABLET | ORAL | Status: DC | PRN

## 2012-04-02 NOTE — ED Notes (Signed)
Pt sent here from [facility] for futher eval from falling today. Per family member, pt has hx of etoh use and has been in rehab, had fall in past that resulted in left arm fx, and a fall that caused C2 fx which he had surgically repaired. Pt was in rehab, checked himself out on Friday and therefore was not given prescriptions when he left. Pt was having pain so he took 3 flexeril and drank whiskey today, was found on floor by family, unsure of how he fell or what he hit his head on, has large laceration to left forehead and pain to left arm where fx was before. Bleeding controlled at triage.

## 2012-04-02 NOTE — ED Provider Notes (Signed)
History     CSN: [id number]  Arrival date & time 04/02/12  1733   None     Chief Complaint  Patient presents with  . Fall  . Alcohol Intoxication    (Consider location/radiation/quality/duration/timing/severity/associated sxs/prior treatment) HPI Comments: Patient is a 67 year old male with a history of alcohol abuse, C2 fracture in November 2013 as well as left humeral fracture in October 2013 presents some urgent care after falling in his home sometime between 11 AM and 12:20 PM today. Patient was recently at a medical rehabilitation after fusion of his C2 vertebrae. Patient checked himself out on Friday AGAINST MEDICAL ADVICE and was not given any pain medication for this reason. Patient states the pain in his left arm and neck was severe today. Patient took 3 Robaxin, 4 gabapentin, and quarter pint of whiskey to light and alleviate his pain. Patient next recalls being found by his son with a laceration above his left eye. Patient is unsure of whether he fell but states that it is likely. Patient denies any pain at this time. He denies fever, vision changes, tinnitus or hearing loss, difficulty swallowing, neck pain, chest pain, shortness of breath, numbness or tingling in his extremities, abdominal pain, and nausea or vomiting. Patient wears a hard cervical collar status post C2 fusion on 03/21/2012.  Patient is a 67 y.o. male presenting with fall and intoxication.  Fall Pertinent negatives include no fever, no numbness, no abdominal pain, no nausea, no vomiting and no headaches.  Alcohol Intoxication Pertinent negatives include no abdominal pain, chest pain, chills, fever, headaches, nausea, neck pain, numbness, vomiting or weakness.    Past Medical History  Diagnosis Date  . HLD (hyperlipidemia) 5/99  . HTN (hypertension) 5/99  . BPH (benign prostatic hypertrophy) 5/99  . GERD (gastroesophageal reflux disease) 07/31/04    gastritis   . Anxiety   . Allergy     pollen/ragweed  .  Adenomatous polyp of colon 2006  . Hearing difficulty   . Humerus fracture 11/02/2011    LUE  . Peripheral vascular disease   . Pneumonia 1996    hosp  . Chronic bronchitis     "q year or q other year" (11/02/2011)  . History of blood transfusion 2012  . Anemia 2012  . Hernia, umbilical     "unrepaired" (11/02/2011)  . H/O hiatal hernia   . Memory loss     "recently has been forgetting alot; maybe related to the alcohol" (11/02/2011)  . Falls frequently     "daily lately; legs just give out" (11/02/2011)  . H/O nausea   . Depression     Hx of  . Shortness of breath     with exertion  . Seasonal allergies     Past Surgical History  Procedure Laterality Date  . L duputryen contractive surgery    . Neck mri  6/04    C5-6 disc abnormality  . Colonoscopy  07/31/04    multiple polyps; repeat in 3 years  . Hosp cp r/o'd 2/24  03/08/07  . Ett myoview  03/09/07    nml EF 66%  . Upper gastrointestinal endoscopy    . Cataract extraction w/ intraocular lens  implant, bilateral  10/2002  . Tonsillectomy      "I was young" (11/02/2011)  . Hemorrhoid surgery      "cauterized a long time ago" (11/02/2011)  . Esophageal varice ligation  2012  . Orif humerus fracture  11/04/2011    Procedure: OPEN REDUCTION INTERNAL  FIXATION (ORIF) PROXIMAL HUMERUS FRACTURE;  Surgeon: Budd Palmer, MD;  Location: [facility] OR;  Service: Orthopedics;  Laterality: Left;  . Humerus fracture surgery Left 11/04/11    with plate to the shoulder  . Posterior cervical fusion/foraminotomy N/A 03/21/2012    Procedure: POSTERIOR CERVICAL FUSION/FORAMINOTOMY LEVEL ONE;  Surgeon: Temple Pacini, MD;  Location: [facility];  Service: Neurosurgery;  Laterality: N/A;  POSTERIOR CERVICAL ONE TO CERVICAL TWO FUSION WITH ILIAC CREST GRAFT AND LATERAL MASS SCREWS     Family History  Problem Relation Age of Onset  . Heart disease Father   . Diabetes Father   . Stroke Father   . Depression Mother   . Heart disease Brother    . Alcohol abuse Brother   . Liver disease Brother     History  Substance Use Topics  . Smoking status: Never Smoker   . Smokeless tobacco: Never Used     Comment: quit 11/02/2011  . Alcohol Use: 540.0 oz/week    900 Glasses of wine per week     Comment: 11/02/2011 "large box of wine q 2 days"     Review of Systems  Constitutional: Negative for fever and chills.  HENT: Negative for trouble swallowing, neck pain and tinnitus.   Eyes: Negative for visual disturbance.  Respiratory: Negative for shortness of breath and wheezing.   Cardiovascular: Negative for chest pain.  Gastrointestinal: Negative for nausea, vomiting and abdominal pain.  Musculoskeletal: Negative for back pain.  Skin: Negative for color change.  Neurological: Positive for syncope. Negative for weakness, numbness and headaches.  All other systems reviewed and are negative.    Allergies  Nifedipine  Home Medications   Current Outpatient Rx  Name  Route  Sig  Dispense  Refill  . albuterol (PROVENTIL) (5 MG/ML) 0.5% nebulizer solution   Nebulization   Take 2.5 mg by nebulization every 2 (two) hours as needed for wheezing or shortness of breath.         . B Complex-C-Folic Acid (VITA-BEE/C PO)   Oral   Take 1 tablet by mouth daily.         . fexofenadine (ALLEGRA) 180 MG tablet   Oral   Take 180 mg by mouth daily as needed. For allergies         . fluticasone (FLONASE) 50 MCG/ACT nasal spray   Nasal   Place 2 sprays into the nose as needed. For allergies         . folic acid (FOLVITE) 1 MG tablet   Oral   Take 1 mg by mouth daily.         [name] gabapentin (NEURONTIN) 300 MG capsule   Oral   Take 300 mg by mouth 4 (four) times daily.          . Magnesium 500 MG TABS   Oral   Take 500 mg by mouth daily.          . methocarbamol (ROBAXIN) 500 MG tablet   Oral   Take 500 mg by mouth 3 (three) times daily as needed (for pain and spasms).         . Misc Natural Products (OSTEO  BI-FLEX JOINT SHIELD PO)   Oral   Take 2 tablets by mouth daily.          . Multiple Vitamins-Minerals (CERTA-VITE PO)   Oral   Take 1 tablet by mouth daily.         [name] oxyCODONE (OXY IR/ROXICODONE) 5  MG immediate release tablet   Oral   Take 2 tablets (10 mg total) by mouth every 6 (six) hours as needed for pain.   80 tablet   0   . pantoprazole (PROTONIX) 40 MG tablet   Oral   Take 40 mg by mouth 2 (two) times daily.         . QUEtiapine (SEROQUEL) 300 MG tablet   Oral   Take 150 mg by mouth at bedtime.         [name] spironolactone (ALDACTONE) 50 MG tablet   Oral   Take 50 mg by mouth daily.         . Thiamine HCl (VITAMIN B-1) 100 MG tablet   Oral   Take 100 mg by mouth daily.          [name] zolpidem (AMBIEN) 5 MG tablet   Oral   Take 1 tablet (5 mg total) by mouth at bedtime as needed for sleep.   30 tablet   0   . oxyCODONE-acetaminophen (PERCOCET/ROXICET) 5-325 MG per tablet   Oral   Take 1-2 tablets by mouth every 4 (four) hours as needed for pain.   10 tablet   0     BP 112/78  Pulse 78  Temp(Src) 98 F (36.7 C) (Oral)  Resp 16  SpO2 94%  Physical Exam  Nursing note and vitals reviewed. Constitutional: He is oriented to person, place, and time. He appears well-developed and well-nourished.  HENT:  Head: Normocephalic.  Right Ear: External ear normal.  Left Ear: External ear normal.  Mouth/Throat: Oropharynx is clear and moist. No oropharyngeal exudate.  Symmetric rise of the uvula with phonation.  Eyes: Conjunctivae and EOM are normal. Pupils are equal, round, and reactive to light. No scleral icterus.  Neck: Neck supple.  Cardiovascular: Normal rate, regular rhythm, normal heart sounds and intact distal pulses.   Capillary refill less than 2 seconds in all extremities. Distal radial, posterior tibial, and dorsalis pedis pulses 2+ bilaterally  Pulmonary/Chest: Effort normal and breath sounds normal. No respiratory distress. He has no wheezes.  He has no rales.  Abdominal: Soft. He exhibits no distension. There is no tenderness.  Musculoskeletal: Normal range of motion. He exhibits no edema and no tenderness.  Patient exhibits no tenderness on palpation of the midline of his thoracic or lumbar spine or paraspinal muscles. No step-offs or bony deformities appreciated. No bruises, abrasions, or ecchymosis appreciated on the patient's chest abdomen or back.  Lymphadenopathy:    He has no cervical adenopathy.  Neurological: He is alert and oriented to person, place, and time.  Cranial nerves II through XII grossly intact. Patient is equal grip strength bilaterally. No sensory or motor deficits. Strength against resistance of the upper and lower extremity muscles 5/5.  Skin: No rash noted. No erythema.  Patient has a laceration above the right eyebrow approximately 4 cm in length with epidural and subcutaneous involvement. No foreign bodies appreciated on initial exam.  Psychiatric: He has a normal mood and affect. His behavior is normal.    ED Course  Procedures (including critical care time)  Labs Reviewed  ETHANOL - Abnormal; Notable for the following:    Alcohol, Ethyl (B) 247 (*)    All other components within normal limits   Ct Head Wo Contrast  04/02/2012  *RADIOLOGY REPORT*  Clinical Data:  Fall.  Alcohol intoxication.  CT HEAD WITHOUT CONTRAST CT CERVICAL SPINE WITHOUT CONTRAST  Technique:  Multidetector CT imaging of the head  and cervical spine was performed following the standard protocol without intravenous contrast.  Multiplanar CT image reconstructions of the cervical spine were also generated.  Comparison:  02/21/2012.  12/01/2011.  CT HEAD  Findings: No mass lesion, mass effect, midline shift, hydrocephalus, hemorrhage.  No territorial ischemia or acute infarction.  Mild atrophy.  Left frontal sinus demonstrates high density the opacification, which can be associated with fungal sinusitis or chronic sinusitis.  Calvarium is  intact.  Mastoid air cells are clear.  Intracranial atherosclerosis is present.  IMPRESSION: No acute intracranial abnormality.  Mild atrophy.  CT CERVICAL SPINE  Findings: Nuchal ligament calcification is present.  Type 2 odontoid fracture appears unchanged compared to prior.  Posterior cerclage wires and C1-C2 rod and screw fixation.  No hardware complication is evident.  Left C2-C3 and C3-C4 facet arthrosis is present.  Chronic 2 mm anterolisthesis of C4 on C5.  Reversal of the normal cervical lordosis centered around C5.  Craniocervical alignment appears within normal limits.  IMPRESSION: Chronic type 2 odontoid fracture status post posterior fixation without complication.  Chronic spondylosis.  Reversal of the normal cervical lordosis around C5 may be positional or secondary to spasm.   Original Report Authenticated By: Andreas Newport, M.D.    Ct Cervical Spine Wo Contrast  04/02/2012  *RADIOLOGY REPORT*  Clinical Data:  Fall.  Alcohol intoxication.  CT HEAD WITHOUT CONTRAST CT CERVICAL SPINE WITHOUT CONTRAST  Technique:  Multidetector CT imaging of the head and cervical spine was performed following the standard protocol without intravenous contrast.  Multiplanar CT image reconstructions of the cervical spine were also generated.  Comparison:  02/21/2012.  12/01/2011.  CT HEAD  Findings: No mass lesion, mass effect, midline shift, hydrocephalus, hemorrhage.  No territorial ischemia or acute infarction.  Mild atrophy.  Left frontal sinus demonstrates high density the opacification, which can be associated with fungal sinusitis or chronic sinusitis.  Calvarium is intact.  Mastoid air cells are clear.  Intracranial atherosclerosis is present.  IMPRESSION: No acute intracranial abnormality.  Mild atrophy.  CT CERVICAL SPINE  Findings: Nuchal ligament calcification is present.  Type 2 odontoid fracture appears unchanged compared to prior.  Posterior cerclage wires and C1-C2 rod and screw fixation.  No hardware  complication is evident.  Left C2-C3 and C3-C4 facet arthrosis is present.  Chronic 2 mm anterolisthesis of C4 on C5.  Reversal of the normal cervical lordosis centered around C5.  Craniocervical alignment appears within normal limits.  IMPRESSION: Chronic type 2 odontoid fracture status post posterior fixation without complication.  Chronic spondylosis.  Reversal of the normal cervical lordosis around C5 may be positional or secondary to spasm.   Original Report Authenticated By: Andreas Newport, M.D.    Dg Shoulder Left  04/02/2012  *RADIOLOGY REPORT*  Clinical Data: Larey Seat.  Left shoulder pain.  LEFT SHOULDER - 2+ VIEW  Comparison: 11/05/2011.  Findings: Remote healed humeral head/neck fracture with fixating hardware.  No definite acute bony findings. The screws in the humeral head appear to be encroaching on the cortex and the lower two screws could be breaching the cortex.  Glenohumeral joint degenerative changes are noted.  The left lung is clear.  IMPRESSION: Remote post-traumatic changes.  No definite acute fracture. Progression of the humeral head screws to and possibly through the cortex.   Original Report Authenticated By: Rudie Meyer, M.D.    Dg Humerus Left  04/02/2012  *RADIOLOGY REPORT*  Clinical Data: Larey Seat.  Left arm pain.  LEFT HUMERUS - 2+ VIEW  Comparison:  11/05/2011.  Findings: Stable fixation hardware related to prior complex humeral head/neck fracture.  No definite acute fracture.  IMPRESSION: Remote healed fractures with fixation hardware.  No definite acute fracture.   Original Report Authenticated By: Rudie Meyer, M.D.    LACERATION REPAIR Performed by: Antony Madura Authorized by: Antony Madura Consent: Verbal consent obtained. Risks and benefits: risks, benefits and alternatives were discussed Consent given by: patient Patient identity confirmed: provided demographic data Prepped and Draped in normal sterile fashion Wound explored  Laceration Location: superior to L  eyebrow  Laceration Length: 4.5cm  No Foreign Bodies seen or palpated  Anesthesia: local infiltration  Local anesthetic: lidocaine 2% with epinephrine  Anesthetic total: 3.5 ml  Irrigation method: syringe Amount of cleaning: standard  Skin closure: 4-0 ethilon  Number of sutures: 5  Technique: simple interrupted  Patient tolerance: Patient tolerated the procedure well with no immediate complications.   1. Laceration of forehead without complication, initial encounter   2. Alcohol abuse     MDM  Patient presents after falling at home with 4.5cm laceration to forehead. Patient also took 3 Robaxin, 4 gabapentin, and quarter pint of whiskey in an attempt to alleviate his L arm and neck pain; patient s/p C2 fusion on 03/21/2012 and has hx of L ORIF of humerus in 10/2011. Neurovascularly intact with ethanol level elevated to 247. Patient well and nontoxic appearing, follows simple commands, and answers questions appropriately; A&Ox4. CT head and C spine without any evidence of acute abnormality. DG L humerus and L shoulder with evidence of progression of the humeral head screws, possibly through the cortex. Patient's laceration repaired in ED without complication. Patient to d/c with orthopedic follow up regarding his L shoulder x-ray findings as well as follow up with his neurosurgeon at his scheduled appointment. Patient advised to f/u with PCP for suture removal in 5-7 days. Indications for ED return discussed. Patient states comfort and understanding with this d/c plan with no unaddressed concerns. Patient seen, also, by Dr. Judd Lien with whom this patient's work up and management was discussed.        Antony Madura, PA-C 04/04/12 [phone]

## 2012-04-02 NOTE — ED Provider Notes (Signed)
History     CSN: [id number]  Arrival date & time 04/02/12  1616   First MD Initiated Contact with Patient 04/02/12 1626      Chief Complaint  Patient presents with  . Head Laceration    (Consider location/radiation/quality/duration/timing/severity/associated sxs/prior treatment) HPI Comments: Patient presents to urgent care brought in by his wife after having been found by his son on the floor at home around a pool of blood. Patient fell under unknown circumstances and had to be wakened up by his son. Uncertainty about what time this could have happened he was found on the floor, " unconscious or sleeping". Patient does not know what he hit suspected his own glasses could have created the laceration on his left for head. Patient describes a headache and soreness around the wound site as well as in the left anterior shoulder area. Patient admits that after taking 3 muscle relaxers it took one or 2 shots of whiskey. Wife goes into describing events that have been occurring since October having had required 2 different surgical interventions with 1 for his left humerus were displaced fracture in sickly for a C2 fracture that was recently intervened in March of this current year.  Patient is a 67 y.o. male presenting with scalp laceration. The history is provided by the patient.  Head Laceration This is a new problem. The current episode started more than 2 days ago. The problem occurs constantly. The problem has been rapidly worsening. Associated symptoms include headaches. Pertinent negatives include no chest pain, no abdominal pain and no shortness of breath. Nothing relieves the symptoms. He has tried nothing for the symptoms. The treatment provided no relief.    Past Medical History  Diagnosis Date  . HLD (hyperlipidemia) 5/99  . HTN (hypertension) 5/99  . BPH (benign prostatic hypertrophy) 5/99  . GERD (gastroesophageal reflux disease) 07/31/04    gastritis   . Anxiety   . Allergy    pollen/ragweed  . Adenomatous polyp of colon 2006  . Hearing difficulty   . Humerus fracture 11/02/2011    LUE  . Peripheral vascular disease   . Pneumonia 1996    hosp  . Chronic bronchitis     "q year or q other year" (11/02/2011)  . History of blood transfusion 2012  . Anemia 2012  . Hernia, umbilical     "unrepaired" (11/02/2011)  . H/O hiatal hernia   . Memory loss     "recently has been forgetting alot; maybe related to the alcohol" (11/02/2011)  . Falls frequently     "daily lately; legs just give out" (11/02/2011)  . H/O nausea   . Depression     Hx of  . Shortness of breath     with exertion  . Seasonal allergies     Past Surgical History  Procedure Laterality Date  . L duputryen contractive surgery    . Neck mri  6/04    C5-6 disc abnormality  . Colonoscopy  07/31/04    multiple polyps; repeat in 3 years  . Hosp cp r/o'd 2/24  03/08/07  . Ett myoview  03/09/07    nml EF 66%  . Upper gastrointestinal endoscopy    . Cataract extraction w/ intraocular lens  implant, bilateral  10/2002  . Tonsillectomy      "I was young" (11/02/2011)  . Hemorrhoid surgery      "cauterized a long time ago" (11/02/2011)  . Esophageal varice ligation  2012  . Orif humerus fracture  11/04/2011  Procedure: OPEN REDUCTION INTERNAL FIXATION (ORIF) PROXIMAL HUMERUS FRACTURE;  Surgeon: Budd Palmer, MD;  Location: [facility] OR;  Service: Orthopedics;  Laterality: Left;  . Humerus fracture surgery Left 11/04/11    with plate to the shoulder  . Posterior cervical fusion/foraminotomy N/A 03/21/2012    Procedure: POSTERIOR CERVICAL FUSION/FORAMINOTOMY LEVEL ONE;  Surgeon: Temple Pacini, MD;  Location: [facility];  Service: Neurosurgery;  Laterality: N/A;  POSTERIOR CERVICAL ONE TO CERVICAL TWO FUSION WITH ILIAC CREST GRAFT AND LATERAL MASS SCREWS     Family History  Problem Relation Age of Onset  . Heart disease Father   . Diabetes Father   . Stroke Father   . Depression Mother   . Heart  disease Brother   . Alcohol abuse Brother   . Liver disease Brother     History  Substance Use Topics  . Smoking status: Never Smoker   . Smokeless tobacco: Never Used     Comment: quit 11/02/2011  . Alcohol Use: 540.0 oz/week    900 Glasses of wine per week     Comment: 11/02/2011 "large box of wine q 2 days"      Review of Systems  Constitutional: Positive for activity change. Negative for fever.  Respiratory: Negative for shortness of breath.   Cardiovascular: Negative for chest pain and leg swelling.  Gastrointestinal: Negative for abdominal pain.  Neurological: Positive for headaches. Negative for dizziness.       Patient with a slurred speech and miotic pupils    Allergies  Nifedipine  Home Medications   Current Outpatient Rx  Name  Route  Sig  Dispense  Refill  . albuterol (PROVENTIL) (5 MG/ML) 0.5% nebulizer solution   Nebulization   Take 2.5 mg by nebulization every 2 (two) hours as needed for wheezing or shortness of breath.         . B Complex-C-Folic Acid (VITA-BEE/C PO)   Oral   Take 1 tablet by mouth daily.         . fexofenadine (ALLEGRA) 180 MG tablet   Oral   Take 180 mg by mouth daily as needed. For allergies         . fluticasone (FLONASE) 50 MCG/ACT nasal spray   Nasal   Place 2 sprays into the nose as needed. For allergies         . folic acid (FOLVITE) 1 MG tablet   Oral   Take 1 mg by mouth daily.         [name] gabapentin (NEURONTIN) 300 MG capsule   Oral   Take 300 mg by mouth 4 (four) times daily.          . Magnesium 500 MG TABS   Oral   Take 500 mg by mouth 2 (two) times daily.         . methocarbamol (ROBAXIN) 500 MG tablet   Oral   Take 500 mg by mouth 3 (three) times daily as needed (for pain and spasms).         . Misc Natural Products (OSTEO BI-FLEX JOINT SHIELD PO)   Oral   Take 2-3 tablets by mouth daily.         . Multiple Vitamins-Minerals (CERTA-VITE PO)   Oral   Take 1 tablet by mouth daily.           [name] oxyCODONE (OXY IR/ROXICODONE) 5 MG immediate release tablet   Oral   Take 2 tablets (10 mg total) by mouth every 6 (  six) hours as needed for pain.   80 tablet   0   . pantoprazole (PROTONIX) 40 MG tablet   Oral   Take 40 mg by mouth 2 (two) times daily.         . QUEtiapine (SEROQUEL) 100 MG tablet   Oral   Take 1.5 tablets (150 mg total) by mouth at bedtime.   45 tablet   0   . spironolactone (ALDACTONE) 50 MG tablet   Oral   Take 50 mg by mouth daily.         . Thiamine HCl (VITAMIN B-1) 100 MG tablet   Oral   Take 100 mg by mouth daily.          [name] zolpidem (AMBIEN) 5 MG tablet   Oral   Take 1 tablet (5 mg total) by mouth at bedtime as needed for sleep.   30 tablet   0     There were no vitals taken for this visit.  Physical Exam  Nursing note and vitals reviewed. Constitutional: He is oriented to person, place, and time.  HENT:  Head:    Eyes: Conjunctivae and lids are normal. Right eye exhibits no nystagmus. Left eye exhibits no nystagmus.    Cardiovascular: Normal rate.   Pulmonary/Chest: Effort normal and breath sounds normal.  Neurological: He is alert and oriented to person, place, and time.  Patient in supine position- brought in by his wife by private vehicle is currently wearing his C-collar. Responsive to my questions but with a slurred speech and some degree of difficulty or delayed response.  Skin: No rash noted.    ED Course  Procedures (including critical care time)  Labs Reviewed - No data to display No results found.   1. Injury of head, face and neck, initial encounter   2. Fall at home, initial encounter       MDM  Patient intoxicated- slurred speech, admitting use of Robaxin, whiskey and OxyContin. Patient was found by his son on the floor after sustaining a head injury unknown how these events occurred. Patient with a recent orthopedic and neurosurgery interventions back in October and in March for a left humeral  displaced fracture as well as a C2 fracture from a fall. Patient is verbal and responding to questions complains of a headache as well as left upper arm pain.  Given the circumstances of the fall and the involvement of multiple areas in the complicated recent surgical history with patient patient will be transferred to the emergency department for a more comprehensive evaluation and observation- given his elevated risk of falling. Patient is hemodynamically stable and is alert x 3-        Jimmie Molly, MD 04/02/12 [phone]

## 2012-04-02 NOTE — ED Notes (Signed)
Patient states he fell and cut his forehead above his left eye earlier today. He took 3 muscle relaxers with some whisky and fell. He also states he has severe pain in left humorus where he has a plate and screws from a previous break and repair last year.

## 2012-04-04 ENCOUNTER — Other Ambulatory Visit: Payer: Self-pay | Admitting: Family Medicine

## 2012-04-04 ENCOUNTER — Other Ambulatory Visit: Payer: Self-pay | Admitting: Internal Medicine

## 2012-04-04 NOTE — ED Provider Notes (Signed)
Medical screening examination/treatment/procedure(s) were conducted as a shared visit with non-physician practitioner(s) and myself.  I personally evaluated the patient during the encounter.  The patient presents with a laceration to the forehead after a fall at home.   He underwent neck surgery two weeks ago and shoulder surgery last year.  He denies loc or neck pain, but does report having a headache.    On exam, the patient is afebrile and the vitals are stable.  There is a 4.5 cm laceration just above the left eyebrow with bleeding controlled, the head is otherwise atraumatic.  The neck is in the cervical collar he has been is since the surgery, but denies pain.  The heart and lung exam is unremarkable.  CT scans of the head and cervical spine are negative for acute injury, only showing the prior injury without complication.  The shoulder films reveal no acute fracture or dislocation.  The laceration was repaired and the patient stable for discharge.  He is ambulatory without difficulty and appears well.  To return for suture removal in one week, sooner for any problems.    Geoffery Lyons, MD 04/04/12 [phone]

## 2012-04-05 DIAGNOSIS — S42213A Unspecified displaced fracture of surgical neck of unspecified humerus, initial encounter for closed fracture: Secondary | ICD-10-CM | POA: Diagnosis not present

## 2012-04-05 DIAGNOSIS — IMO0001 Reserved for inherently not codable concepts without codable children: Secondary | ICD-10-CM | POA: Diagnosis not present

## 2012-04-07 ENCOUNTER — Other Ambulatory Visit: Payer: Self-pay | Admitting: Family Medicine

## 2012-04-11 ENCOUNTER — Ambulatory Visit: Payer: Medicare Other | Admitting: Family Medicine

## 2012-04-12 ENCOUNTER — Ambulatory Visit (INDEPENDENT_AMBULATORY_CARE_PROVIDER_SITE_OTHER): Payer: Medicare Other | Admitting: Family Medicine

## 2012-04-12 ENCOUNTER — Encounter: Payer: Self-pay | Admitting: Family Medicine

## 2012-04-12 VITALS — BP 120/70 | HR 96 | Temp 98.1°F | Wt 191.0 lb

## 2012-04-12 DIAGNOSIS — S12100S Unspecified displaced fracture of second cervical vertebra, sequela: Secondary | ICD-10-CM

## 2012-04-12 DIAGNOSIS — S42202S Unspecified fracture of upper end of left humerus, sequela: Secondary | ICD-10-CM

## 2012-04-12 DIAGNOSIS — S0990XA Unspecified injury of head, initial encounter: Secondary | ICD-10-CM | POA: Insufficient documentation

## 2012-04-12 DIAGNOSIS — F329 Major depressive disorder, single episode, unspecified: Secondary | ICD-10-CM

## 2012-04-12 DIAGNOSIS — IMO0002 Reserved for concepts with insufficient information to code with codable children: Secondary | ICD-10-CM

## 2012-04-12 DIAGNOSIS — S42309S Unspecified fracture of shaft of humerus, unspecified arm, sequela: Secondary | ICD-10-CM

## 2012-04-12 DIAGNOSIS — F1010 Alcohol abuse, uncomplicated: Secondary | ICD-10-CM

## 2012-04-12 MED ORDER — QUETIAPINE FUMARATE 300 MG PO TABS: 150.0000 mg | ORAL_TABLET | Freq: Every day | ORAL | Status: DC

## 2012-04-12 MED ORDER — METHOCARBAMOL 500 MG PO TABS: 500.0000 mg | ORAL_TABLET | Freq: Three times a day (TID) | ORAL | Status: DC | PRN

## 2012-04-12 MED ORDER — TRAZODONE HCL 50 MG PO TABS: 25.0000 mg | ORAL_TABLET | Freq: Every evening | ORAL | Status: DC | PRN

## 2012-04-12 MED ORDER — GABAPENTIN 300 MG PO CAPS: 300.0000 mg | ORAL_CAPSULE | Freq: Four times a day (QID) | ORAL | Status: DC

## 2012-04-12 MED ORDER — PANTOPRAZOLE SODIUM 40 MG PO TBEC: 40.0000 mg | DELAYED_RELEASE_TABLET | Freq: Two times a day (BID) | ORAL | Status: DC

## 2012-04-12 MED ORDER — OXYCODONE HCL 5 MG PO TABS: 10.0000 mg | ORAL_TABLET | Freq: Four times a day (QID) | ORAL | Status: DC | PRN

## 2012-04-12 NOTE — Patient Instructions (Addendum)
Good to see you. We are starting Trazodone for sleep. I am refilling your oxycodone- but use carefully.  We are increasing your seroquel to 300 mg daily.

## 2012-04-12 NOTE — Progress Notes (Signed)
67 year old pleasant male well known to me with h/o alcoholic liver disease, depression and chronic hyponatremia here for ER follow up.  Fractured his left humerus on 11/02/2011 after he had a fall at home, he admits to be intoxicated at the time.  Required ORIF and was sent to [facility] place for rehab.  He unfortunately had a very complicated course there and was just discharged on 2/14!  On 11/17, he was found on the floor at [facility].  Mr. Seddon does not remember what happened.  3 days later, due to AMS, was sent to [facility] ER.  Head CT on 11/20 (reviewed in Epic) showed a non displaced type 2 odontoid fracture and a non displaced/incomplete fracture of C3.    Referred to neurosurgeon, Dr. Gerlene Fee, who placed in a a hard cervical collar for 4 weeks.  Follow up xrays in his office according to his wife showed good aligment, so cervical collar was continued.  CT of cervical spine was performed on 2/10/214 (reviewed in Epic), which showed no healing of C2 fracture.  Neurosurg recommend posterior surgical fusion which was performed by Dr. Dutch Quint on 03/21/12.  Surgery went well however he was sent to [facility] for rehab and left AMA.  Since he left AMA, meds were not refilled.    Unfortunately on 3/23, was in severe left shoulder and neck pain, so he took 3 Robaxin, 4 gabapentin, and quarter pint of whiskey to light and alleviate his pain.  Fell on floor and son found him lying on floor with laceration on forehead.  He was than taken to [facility].  Notes reviewed.    5 sutures were placed to laceration on left forehead.  Multiple images were neg or unchanged.  He has a known displaced screw in left shoulder.  Per pt, Dr. Dutch Quint told him at office visit this should improve with time.   Dg Chest 2 View  03/17/2012  *RADIOLOGY REPORT*  Clinical Data: Preop for cervical fusion  CHEST - 2 VIEW  Comparison: 11/12/2011  Findings: Cardiomediastinal silhouette is stable.  No acute infiltrate or pulmonary edema.   Old fracture of the right seventh rib.  Degenerative changes thoracic spine.  Postsurgical changes left proximal humerus partially visualized.  IMPRESSION: No active disease.  Stable old fracture of the right seventh rib.   Original Report Authenticated By: Natasha Mead, M.D.    Dg Cervical Spine 2-3 Views  03/21/2012  *RADIOLOGY REPORT*  Clinical Data: Odontoid fracture.  CERVICAL SPINE - 2-3 VIEW  Comparison: CT on 02/21/2012  Findings: There has been placement of pedicle screws and posterior fixation rods as well as a posterior cerclage wire at C1-2. Alignment is anatomic.  IMPRESSION: Posterior fusion of C1-2 in anatomic alignment.   Original Report Authenticated By: Myles Rosenthal, M.D.    Ct Head Wo Contrast  04/02/2012  *RADIOLOGY REPORT*  Clinical Data:  Fall.  Alcohol intoxication.  CT HEAD WITHOUT CONTRAST CT CERVICAL SPINE WITHOUT CONTRAST  Technique:  Multidetector CT imaging of the head and cervical spine was performed following the standard protocol without intravenous contrast.  Multiplanar CT image reconstructions of the cervical spine were also generated.  Comparison:  02/21/2012.  12/01/2011.  CT HEAD  Findings: No mass lesion, mass effect, midline shift, hydrocephalus, hemorrhage.  No territorial ischemia or acute infarction.  Mild atrophy.  Left frontal sinus demonstrates high density the opacification, which can be associated with fungal sinusitis or chronic sinusitis.  Calvarium is intact.  Mastoid air cells are clear.  Intracranial atherosclerosis is present.  IMPRESSION: No acute intracranial abnormality.  Mild atrophy.  CT CERVICAL SPINE  Findings: Nuchal ligament calcification is present.  Type 2 odontoid fracture appears unchanged compared to prior.  Posterior cerclage wires and C1-C2 rod and screw fixation.  No hardware complication is evident.  Left C2-C3 and C3-C4 facet arthrosis is present.  Chronic 2 mm anterolisthesis of C4 on C5.  Reversal of the normal cervical lordosis centered  around C5.  Craniocervical alignment appears within normal limits.  IMPRESSION: Chronic type 2 odontoid fracture status post posterior fixation without complication.  Chronic spondylosis.  Reversal of the normal cervical lordosis around C5 may be positional or secondary to spasm.   Original Report Authenticated By: Andreas Newport, M.D.    Ct Cervical Spine Wo Contrast  04/02/2012  *RADIOLOGY REPORT*  Clinical Data:  Fall.  Alcohol intoxication.  CT HEAD WITHOUT CONTRAST CT CERVICAL SPINE WITHOUT CONTRAST  Technique:  Multidetector CT imaging of the head and cervical spine was performed following the standard protocol without intravenous contrast.  Multiplanar CT image reconstructions of the cervical spine were also generated.  Comparison:  02/21/2012.  12/01/2011.  CT HEAD  Findings: No mass lesion, mass effect, midline shift, hydrocephalus, hemorrhage.  No territorial ischemia or acute infarction.  Mild atrophy.  Left frontal sinus demonstrates high density the opacification, which can be associated with fungal sinusitis or chronic sinusitis.  Calvarium is intact.  Mastoid air cells are clear.  Intracranial atherosclerosis is present.  IMPRESSION: No acute intracranial abnormality.  Mild atrophy.  CT CERVICAL SPINE  Findings: Nuchal ligament calcification is present.  Type 2 odontoid fracture appears unchanged compared to prior.  Posterior cerclage wires and C1-C2 rod and screw fixation.  No hardware complication is evident.  Left C2-C3 and C3-C4 facet arthrosis is present.  Chronic 2 mm anterolisthesis of C4 on C5.  Reversal of the normal cervical lordosis centered around C5.  Craniocervical alignment appears within normal limits.  IMPRESSION: Chronic type 2 odontoid fracture status post posterior fixation without complication.  Chronic spondylosis.  Reversal of the normal cervical lordosis around C5 may be positional or secondary to spasm.   Original Report Authenticated By: Andreas Newport, M.D.    Dg  Shoulder Left  04/02/2012  *RADIOLOGY REPORT*  Clinical Data: Larey Seat.  Left shoulder pain.  LEFT SHOULDER - 2+ VIEW  Comparison: 11/05/2011.  Findings: Remote healed humeral head/neck fracture with fixating hardware.  No definite acute bony findings. The screws in the humeral head appear to be encroaching on the cortex and the lower two screws could be breaching the cortex.  Glenohumeral joint degenerative changes are noted.  The left lung is clear.  IMPRESSION: Remote post-traumatic changes.  No definite acute fracture. Progression of the humeral head screws to and possibly through the cortex.   Original Report Authenticated By: Rudie Meyer, M.D.    Dg Humerus Left  04/02/2012  *RADIOLOGY REPORT*  Clinical Data: Larey Seat.  Left arm pain.  LEFT HUMERUS - 2+ VIEW  Comparison: 11/05/2011.  Findings: Stable fixation hardware related to prior complex humeral head/neck fracture.  No definite acute fracture.  IMPRESSION: Remote healed fractures with fixation hardware.  No definite acute fracture.   Original Report Authenticated By: Rudie Meyer, M.D.    He still has significant shoulder pain.  He also feels more sad and tearful.  Initially seroquel was effective but not over last 3 weeks.  Also feels more anxious.  Denies any SI or HI.  Has not had anything  to drink since his fall.    Patient Active Problem List  Diagnosis  . HYPERLIPIDEMIA  . Hyposmolality and/or hyponatremia  . HYPOKALEMIA  . ANXIETY STATE, UNSPECIFIED  . ALCOHOL ABUSE  . Unspecified hearing loss  . HYPERTENSION  . ALLERGIC RHINITIS, SEASONAL  . BRONCHITIS, ALLERGIC  . ALCOHOLIC CIRRHOSIS OF LIVER  . BENIGN PROSTATIC HYPERTROPHY  . HYDROCELES, BILATERAL  . IMPOTENCE, ORGANIC ORIGIN  . ROSACEA  . LEG PAIN, LEFT  . ORTHOSTATIC DIZZINESS  . MEMORY LOSS  . LEG EDEMA, BILATERAL  . ECCHYMOSES  . SHORTNESS OF BREATH  . NAUSEA  . ABDOMINAL DISTENSION  . OTHER ASCITES  . HYPERGLYCEMIA  . LIVER FUNCTION TESTS, ABNORMAL  . CERVICAL  MUSCLE STRAIN  . UNSPECIFIED DEFICIENCY ANEMIA  . BPH (benign prostatic hyperplasia)  . Routine general medical examination at a health care facility  . Special screening for malignant neoplasms, colon  . Colon polyp  . Diverticulosis of colon (without mention of hemorrhage)  . Lower extremity edema  . Gastritis and duodenitis  . Gastric ulcer  . H/O: upper GI bleed  . Stomach ulcer from aspirin/ibuprofen-like drugs (NSAID's)  . Alcohol abuse  . Nocturnal muscle cramps  . Hyponatremia  . Urinary frequency  . Insect bites  . Fracture of humerus, proximal, left, closed  . Metabolic acidosis  . Leukocytosis  . Respiratory failure, post-operative  . Delirium tremens  . C2 cervical fracture  . Hypertrophy of male breast  . Anemia due to blood loss, acute   Past Medical History  Diagnosis Date  . HLD (hyperlipidemia) 5/99  . HTN (hypertension) 5/99  . BPH (benign prostatic hypertrophy) 5/99  . GERD (gastroesophageal reflux disease) 07/31/04    gastritis   . Anxiety   . Allergy     pollen/ragweed  . Adenomatous polyp of colon 2006  . Hearing difficulty   . Humerus fracture 11/02/2011    LUE  . Peripheral vascular disease   . Pneumonia 1996    hosp  . Chronic bronchitis     "q year or q other year" (11/02/2011)  . History of blood transfusion 2012  . Anemia 2012  . Hernia, umbilical     "unrepaired" (11/02/2011)  . H/O hiatal hernia   . Memory loss     "recently has been forgetting alot; maybe related to the alcohol" (11/02/2011)  . Falls frequently     "daily lately; legs just give out" (11/02/2011)  . H/O nausea   . Depression     Hx of  . Shortness of breath     with exertion  . Seasonal allergies    Past Surgical History  Procedure Laterality Date  . L duputryen contractive surgery    . Neck mri  6/04    C5-6 disc abnormality  . Colonoscopy  07/31/04    multiple polyps; repeat in 3 years  . Hosp cp r/o'd 2/24  03/08/07  . Ett myoview  03/09/07    nml EF  66%  . Upper gastrointestinal endoscopy    . Cataract extraction w/ intraocular lens  implant, bilateral  10/2002  . Tonsillectomy      "I was young" (11/02/2011)  . Hemorrhoid surgery      "cauterized a long time ago" (11/02/2011)  . Esophageal varice ligation  2012  . Orif humerus fracture  11/04/2011    Procedure: OPEN REDUCTION INTERNAL FIXATION (ORIF) PROXIMAL HUMERUS FRACTURE;  Surgeon: Budd Palmer, MD;  Location: [facility] OR;  Service: Orthopedics;  Laterality: Left;  . Humerus fracture surgery Left 11/04/11    with plate to the shoulder  . Posterior cervical fusion/foraminotomy N/A 03/21/2012    Procedure: POSTERIOR CERVICAL FUSION/FORAMINOTOMY LEVEL ONE;  Surgeon: Temple Pacini, MD;  Location: [facility] NEURO ORS;  Service: Neurosurgery;  Laterality: N/A;  POSTERIOR CERVICAL ONE TO CERVICAL TWO FUSION WITH ILIAC CREST GRAFT AND LATERAL MASS SCREWS    History  Substance Use Topics  . Smoking status: Never Smoker   . Smokeless tobacco: Never Used     Comment: quit 11/02/2011  . Alcohol Use: 540.0 oz/week    900 Glasses of wine per week     Comment: 11/02/2011 "large box of wine q 2 days"   Family History  Problem Relation Age of Onset  . Heart disease Father   . Diabetes Father   . Stroke Father   . Depression Mother   . Heart disease Brother   . Alcohol abuse Brother   . Liver disease Brother    Allergies  Allergen Reactions  . Nifedipine     REACTION: SWELLING 11/02/2011 pt denies this allergy.   Current Outpatient Prescriptions on File Prior to Visit  Medication Sig Dispense Refill  . albuterol (PROVENTIL) (5 MG/ML) 0.5% nebulizer solution Take 2.5 mg by nebulization every 2 (two) hours as needed for wheezing or shortness of breath.      . B Complex-C-Folic Acid (VITA-BEE/C PO) Take 1 tablet by mouth daily.      . cyclobenzaprine (FLEXERIL) 10 MG tablet TAKE 1 TABLET (10 MG TOTAL) BY MOUTH 2 (TWO) TIMES DAILY AS NEEDED.  60 tablet  0  . fexofenadine (ALLEGRA) 180 MG tablet  Take 180 mg by mouth daily as needed. For allergies      . fluticasone (FLONASE) 50 MCG/ACT nasal spray Place 2 sprays into the nose as needed. For allergies      . folic acid (FOLVITE) 1 MG tablet Take 1 mg by mouth daily.      . Magnesium 500 MG TABS Take 500 mg by mouth daily.       . Misc Natural Products (OSTEO BI-FLEX JOINT SHIELD PO) Take 2 tablets by mouth daily.       . Multiple Vitamins-Minerals (CERTA-VITE PO) Take 1 tablet by mouth daily.      [name] oxyCODONE-acetaminophen (PERCOCET/ROXICET) 5-325 MG per tablet Take 1-2 tablets by mouth every 4 (four) hours as needed for pain.  10 tablet  0  . spironolactone (ALDACTONE) 50 MG tablet TAKE 1 TABLET BY MOUTH EVERY DAY  30 tablet  3  . Thiamine HCl (VITAMIN B-1) 100 MG tablet Take 100 mg by mouth daily.       [name] zolpidem (AMBIEN) 5 MG tablet Take 1 tablet (5 mg total) by mouth at bedtime as needed for sleep.  30 tablet  0   No current facility-administered medications on file prior to visit.   The PMH, PSH, Social History, Family History, Medications, and allergies have been reviewed in [facility], and have been updated if relevant.  ROS: +diffuculty falling asleep  Physical exam: BP 120/70  Pulse 96  Temp(Src) 98.1 F (36.7 C)  Wt 191 lb (86.637 kg)  BMI 28.19 kg/m2  General: alert and well-developed, more color in his face today, wearing hard cervical collar Lungs: Normal respiratory effort, chest expands symmetrically. Decreased BS at bases bilaterally, right>left, no wheezing or crackles.  Breasts:   Does have some hypertrophy of breast tissue beneath nipples bilaterally, no nipple drainage, very TTP  with manipulation of breast bilaterally. Heart: Normal rate and regular rhythm. S1 and S2 normal without gallop, murmur, click, rub or other extra sounds.  Abdomen: some distension, pos BS, no tenderness, no guarding  Neurologic: alert & oriented X3.  Psych: normally interactive and good eye contact.  Ext: No edema  Assessment and  Plan:   1. Alcohol abuse He unfortunately did drink prior to his fall and has refused treatment.  Per pt, not currently drinking.  2. C2 cervical fracture, sequela Followed by Dr. Dutch Quint.  Will be in soft collar in new few weeks.  3. Fracture of humerus, proximal, left, closed, sequela Complicated by displaced screw.  I did refill his oxycodone because he does have real pain- only his wife can give it to him.  She keeps it locked in a safe place.  4. Head injury, unspecified Sutures removed without complication.  5. Depression Deteriorated.  Breast tenderness has resolved and lab work unremarkable.  Will increase seroquel to 300 mg daily. The patient indicates understanding of these issues and agrees with the plan.  6.  Insomnia Start low dose trazodone prn insomnia. Follow up in 4 weeks. The patient indicates understanding of these issues and agrees with the plan.

## 2012-04-13 ENCOUNTER — Telehealth: Payer: Self-pay | Admitting: Family Medicine

## 2012-04-13 ENCOUNTER — Telehealth: Payer: Self-pay | Admitting: Radiology

## 2012-04-13 ENCOUNTER — Other Ambulatory Visit (INDEPENDENT_AMBULATORY_CARE_PROVIDER_SITE_OTHER): Payer: Medicare Other

## 2012-04-13 DIAGNOSIS — D649 Anemia, unspecified: Secondary | ICD-10-CM

## 2012-04-13 DIAGNOSIS — D62 Acute posthemorrhagic anemia: Secondary | ICD-10-CM

## 2012-04-13 LAB — CBC WITH DIFFERENTIAL/PLATELET
Basophils Relative: 1 % (ref 0.0–3.0)
Eosinophils Relative: 2.7 % (ref 0.0–5.0)
HCT: 26.9 % — ABNORMAL LOW (ref 39.0–52.0)
Lymphs Abs: 1.4 10*3/uL (ref 0.7–4.0)
MCV: 79.5 fl (ref 78.0–100.0)
Monocytes Absolute: 0.7 10*3/uL (ref 0.1–1.0)
Neutro Abs: 4.2 10*3/uL (ref 1.4–7.7)
Platelets: 325 10*3/uL (ref 150.0–400.0)
RBC: 3.38 Mil/uL — ABNORMAL LOW (ref 4.22–5.81)
WBC: 6.5 10*3/uL (ref 4.5–10.5)

## 2012-04-13 NOTE — Addendum Note (Signed)
Addended by: Alvina Chou on: 04/13/2012 12:05 PM   Modules accepted: Orders

## 2012-04-13 NOTE — Telephone Encounter (Signed)
Patients wife advised and is coming to get labs done

## 2012-04-13 NOTE — Telephone Encounter (Signed)
hgb about same as last checked 03/22/2012 at hosp discharge. May await for Dr. Mervyn Skeeters tomorrow.

## 2012-04-13 NOTE — Telephone Encounter (Signed)
Please call pt to have him come for labs.  I will place orders.  I just received lab report from [facility] where he left AMA.  Hgb was very low.

## 2012-04-13 NOTE — Telephone Encounter (Signed)
Hgb 8.9, results given to Dr Sharen Hones, and sent to Dr Dayton Martes

## 2012-04-14 ENCOUNTER — Other Ambulatory Visit: Payer: Self-pay | Admitting: Family Medicine

## 2012-04-14 MED ORDER — FERROUS SULFATE 325 (65 FE) MG PO TABS: 325.0000 mg | ORAL_TABLET | Freq: Two times a day (BID) | ORAL | Status: DC

## 2012-04-14 NOTE — Telephone Encounter (Signed)
Please lt pt know that hemoglobin is low but slightly improved from his last Hgb at SNF.  Will recheck when he follows up with me in a few weeks- please verify he has appt scheduled.

## 2012-04-14 NOTE — Telephone Encounter (Signed)
Advised patient's wife.  Appt scheduled for 5/2.

## 2012-04-20 DIAGNOSIS — M542 Cervicalgia: Secondary | ICD-10-CM | POA: Diagnosis not present

## 2012-05-05 DIAGNOSIS — I10 Essential (primary) hypertension: Secondary | ICD-10-CM

## 2012-05-05 DIAGNOSIS — S42309D Unspecified fracture of shaft of humerus, unspecified arm, subsequent encounter for fracture with routine healing: Secondary | ICD-10-CM

## 2012-05-05 DIAGNOSIS — F1020 Alcohol dependence, uncomplicated: Secondary | ICD-10-CM

## 2012-05-05 DIAGNOSIS — N400 Enlarged prostate without lower urinary tract symptoms: Secondary | ICD-10-CM

## 2012-05-07 ENCOUNTER — Other Ambulatory Visit: Payer: Self-pay | Admitting: Family Medicine

## 2012-05-10 DIAGNOSIS — S42213A Unspecified displaced fracture of surgical neck of unspecified humerus, initial encounter for closed fracture: Secondary | ICD-10-CM | POA: Diagnosis not present

## 2012-05-10 DIAGNOSIS — IMO0001 Reserved for inherently not codable concepts without codable children: Secondary | ICD-10-CM | POA: Diagnosis not present

## 2012-05-11 ENCOUNTER — Other Ambulatory Visit (INDEPENDENT_AMBULATORY_CARE_PROVIDER_SITE_OTHER): Payer: Medicare Other

## 2012-05-11 ENCOUNTER — Telehealth: Payer: Self-pay

## 2012-05-11 ENCOUNTER — Other Ambulatory Visit: Payer: Self-pay | Admitting: Family Medicine

## 2012-05-11 DIAGNOSIS — D649 Anemia, unspecified: Secondary | ICD-10-CM | POA: Diagnosis not present

## 2012-05-11 LAB — CBC WITH DIFFERENTIAL/PLATELET
Basophils Absolute: 0 10*3/uL (ref 0.0–0.1)
Eosinophils Relative: 1.5 % (ref 0.0–5.0)
HCT: 33.5 % — ABNORMAL LOW (ref 39.0–52.0)
Hemoglobin: 11.4 g/dL — ABNORMAL LOW (ref 13.0–17.0)
Lymphocytes Relative: 20.2 % (ref 12.0–46.0)
Lymphs Abs: 1.2 10*3/uL (ref 0.7–4.0)
MCHC: 34.2 g/dL (ref 30.0–36.0)
MCV: 80.2 fl (ref 78.0–100.0)
Monocytes Relative: 9.9 % (ref 3.0–12.0)
Neutrophils Relative %: 67.6 % (ref 43.0–77.0)
Platelets: 221 10*3/uL (ref 150.0–400.0)
RBC: 4.18 Mil/uL — ABNORMAL LOW (ref 4.22–5.81)
RDW: 19.5 % — ABNORMAL HIGH (ref 11.5–14.6)

## 2012-05-11 NOTE — Telephone Encounter (Signed)
Mrs Vanhorn said she will not be able to come with pt to his appt on 05/12/12 and request that Dr Dayton Martes reviews pts med list; Mrs Partch wonders if pt needs to be on all listed medications. Mrs Larouche also wants to know if Dr Dayton Martes thinks pt should stop oxycodone and go to lower level of pain med. Mrs Aston said pt is in pain all the time. Pt's left leg is more swollen than the right leg and request Dr Dayton Martes ck pts legs. Mrs Corrales said if Dr Dayton Martes does feel that pt should remain on oxycodone do not give rx to pt and let Mrs Blumstein pick up at later time. (Pt cannot always remember when he takes med). Mrs Yera said if Dr Dayton Martes needs to speak with her to call [phone]. I asked Mrs Rocca if she wanted to reschedule pt's appt when she can come also. She said" no pt is going to do what pt is going to do."

## 2012-05-11 NOTE — Telephone Encounter (Signed)
Advised pt's wife, she said just leave pt's appt the way it is, because she doesn't know when she would be able to come in with him.

## 2012-05-11 NOTE — Telephone Encounter (Signed)
I really would prefer for his appt to be rescheduled for when she can come with him.  We need to have this discussion in front of him with her, it will be far more beneficial.

## 2012-05-12 ENCOUNTER — Ambulatory Visit (INDEPENDENT_AMBULATORY_CARE_PROVIDER_SITE_OTHER): Payer: Medicare Other | Admitting: Family Medicine

## 2012-05-12 ENCOUNTER — Encounter: Payer: Self-pay | Admitting: Family Medicine

## 2012-05-12 ENCOUNTER — Ambulatory Visit: Payer: Medicare Other | Admitting: Family Medicine

## 2012-05-12 VITALS — BP 120/80 | HR 86 | Temp 98.0°F | Wt 200.0 lb

## 2012-05-12 DIAGNOSIS — M25512 Pain in left shoulder: Secondary | ICD-10-CM

## 2012-05-12 DIAGNOSIS — F1010 Alcohol abuse, uncomplicated: Secondary | ICD-10-CM

## 2012-05-12 DIAGNOSIS — R609 Edema, unspecified: Secondary | ICD-10-CM

## 2012-05-12 DIAGNOSIS — N400 Enlarged prostate without lower urinary tract symptoms: Secondary | ICD-10-CM

## 2012-05-12 DIAGNOSIS — F329 Major depressive disorder, single episode, unspecified: Secondary | ICD-10-CM

## 2012-05-12 DIAGNOSIS — D539 Nutritional anemia, unspecified: Secondary | ICD-10-CM

## 2012-05-12 DIAGNOSIS — D62 Acute posthemorrhagic anemia: Secondary | ICD-10-CM

## 2012-05-12 DIAGNOSIS — M25519 Pain in unspecified shoulder: Secondary | ICD-10-CM

## 2012-05-12 DIAGNOSIS — K7030 Alcoholic cirrhosis of liver without ascites: Secondary | ICD-10-CM

## 2012-05-12 DIAGNOSIS — F411 Generalized anxiety disorder: Secondary | ICD-10-CM

## 2012-05-12 DIAGNOSIS — F32A Depression, unspecified: Secondary | ICD-10-CM

## 2012-05-12 DIAGNOSIS — Z23 Encounter for immunization: Secondary | ICD-10-CM

## 2012-05-12 DIAGNOSIS — R600 Localized edema: Secondary | ICD-10-CM

## 2012-05-12 MED ORDER — LIDOCAINE 5 % EX PTCH: 1.0000 | MEDICATED_PATCH | CUTANEOUS | Status: DC

## 2012-05-12 MED ORDER — OXYCODONE HCL 5 MG PO TABS: 10.0000 mg | ORAL_TABLET | Freq: Four times a day (QID) | ORAL | Status: DC | PRN

## 2012-05-12 NOTE — Patient Instructions (Addendum)
Good to see you. We are adding lidoderm patch to use on your shoulder.  Call me in a week or two to let me know if the lidoderm is working.  Please come see me in 3 months.

## 2012-05-12 NOTE — Addendum Note (Signed)
Addended by: Eliezer Bottom on: 05/12/2012 03:27 PM   Modules accepted: Orders

## 2012-05-12 NOTE — Progress Notes (Signed)
67 year old pleasant male well known to me with h/o alcoholic liver disease, depression and chronic hyponatremia here for one month follow up.  Has had a complicated recent history.    Fractured his left humerus on 11/02/2011 after he had a fall at home, he admits to be intoxicated at the time.  Required ORIF and was sent to [facility] for rehab.  He unfortunately had a very complicated course there and was just discharged on 2/14!  On 11/17, he was found on the floor at [facility].  Mr. Mcniel does not remember what happened.  3 days later, due to AMS, was sent to [facility] ER.  Head CT on 11/20 (reviewed in Epic) showed a non displaced type 2 odontoid fracture and a non displaced/incomplete fracture of C3.    Referred to neurosurgeon, Dr. Gerlene Fee, who placed in a a hard cervical collar for 4 weeks.  Follow up xrays in his office according to his wife showed good aligment, so cervical collar was continued.  CT of cervical spine was performed on 2/10/214 (reviewed in Epic), which showed no healing of C2 fracture.  Neurosurg recommend posterior surgical fusion which was performed by Dr. Dutch Quint on 03/21/12.  Surgery went well however he was sent to [facility] for rehab and left AMA.    When I saw him for hospital follow up last month, depression and insomnia had worsened.  We increased Seroquel to 300 mg daily and added Trazadone prn insomnia.  Anemia- acute blood loss iron deficiency along with component of anemia of chronic disease- much improved from 3 weeks ago, now 11.4 after starting iron supplementation ( was 8.9). Lab Results  Component Value Date   WBC 6.0 05/11/2012   HGB 11.4* 05/11/2012   HCT 33.5* 05/11/2012   MCV 80.2 05/11/2012   PLT 221.0 05/11/2012   Lab Results  Component Value Date   NA 135 03/22/2012   K 5.0 03/22/2012   CL 102 03/22/2012   CO2 24 03/22/2012   Wife left message stating that Mr. Friberg left leg is more swollen than the right leg and request that I check his  legs.  Left shoulder pain- saw Dr. Carola Frost with ortho yesterday.  Per pt, imaging scheduled to look at left shoulder.  Left shoulder pain so severe it's keeping him up at night.  Dr. Carola Frost stated that he has a "screw loose" and he may need to remove plate in left shoulder.    Patient Active Problem List   Diagnosis Date Noted  . Head injury, unspecified 04/12/2012  . Depression 04/12/2012  . Anemia due to blood loss, acute 03/23/2012  . C2 cervical fracture 02/29/2012  . Hypertrophy of male breast 02/29/2012  . Respiratory failure, post-operative 11/04/2011  . Fracture of humerus, proximal, left, closed 11/02/2011  . Metabolic acidosis 11/02/2011  . Leukocytosis 11/02/2011  . Insect bites 07/01/2011  . Hyponatremia 11/17/2010  . Urinary frequency 11/17/2010  . Nocturnal muscle cramps 11/04/2010  . Gastric ulcer 10/20/2010  . H/O: upper GI bleed 10/20/2010  . Stomach ulcer from aspirin/ibuprofen-like drugs (NSAID's) 10/20/2010  . Gastritis and duodenitis 09/22/2010  . Lower extremity edema 09/07/2010  . Special screening for malignant neoplasms, colon 08/26/2010  . Colon polyp 08/26/2010  . Diverticulosis of colon (without mention of hemorrhage) 08/26/2010  . BPH (benign prostatic hyperplasia) 07/02/2010  . UNSPECIFIED DEFICIENCY ANEMIA 03/09/2010  . Hyposmolality and/or hyponatremia 10/17/2009  . LEG PAIN, LEFT 10/14/2009  . ORTHOSTATIC DIZZINESS 10/14/2009  . MEMORY LOSS 10/14/2009  .  ALCOHOLIC CIRRHOSIS OF LIVER 07/25/2009  . LEG EDEMA, BILATERAL 06/20/2009  . SHORTNESS OF BREATH 06/20/2009  . OTHER ASCITES 06/20/2009  . NAUSEA 02/19/2008  . CERVICAL MUSCLE STRAIN 02/19/2008  . HYPOKALEMIA 04/03/2007  . Unspecified hearing loss 04/03/2007  . ANXIETY STATE, UNSPECIFIED 01/24/2007  . HYPERLIPIDEMIA 07/28/2006  . ALCOHOL ABUSE 07/28/2006  . HYPERTENSION 07/28/2006  . HYDROCELES, BILATERAL 07/28/2006  . IMPOTENCE, ORGANIC ORIGIN 07/28/2006  . ROSACEA 07/28/2006  .  HYPERGLYCEMIA 07/28/2006  . LIVER FUNCTION TESTS, ABNORMAL 07/28/2006   Past Medical History  Diagnosis Date  . HLD (hyperlipidemia) 5/99  . HTN (hypertension) 5/99  . BPH (benign prostatic hypertrophy) 5/99  . GERD (gastroesophageal reflux disease) 07/31/04    gastritis   . Anxiety   . Allergy     pollen/ragweed  . Adenomatous polyp of colon 2006  . Hearing difficulty   . Humerus fracture 11/02/2011    LUE  . Peripheral vascular disease   . Pneumonia 1996    hosp  . Chronic bronchitis     "q year or q other year" (11/02/2011)  . History of blood transfusion 2012  . Anemia 2012  . Hernia, umbilical     "unrepaired" (11/02/2011)  . H/O hiatal hernia   . Memory loss     "recently has been forgetting alot; maybe related to the alcohol" (11/02/2011)  . Falls frequently     "daily lately; legs just give out" (11/02/2011)  . H/O nausea   . Depression     Hx of  . Shortness of breath     with exertion  . Seasonal allergies    Past Surgical History  Procedure Laterality Date  . L duputryen contractive surgery    . Neck mri  6/04    C5-6 disc abnormality  . Colonoscopy  07/31/04    multiple polyps; repeat in 3 years  . Hosp cp r/o'd 2/24  03/08/07  . Ett myoview  03/09/07    nml EF 66%  . Upper gastrointestinal endoscopy    . Cataract extraction w/ intraocular lens  implant, bilateral  10/2002  . Tonsillectomy      "I was young" (11/02/2011)  . Hemorrhoid surgery      "cauterized a long time ago" (11/02/2011)  . Esophageal varice ligation  2012  . Orif humerus fracture  11/04/2011    Procedure: OPEN REDUCTION INTERNAL FIXATION (ORIF) PROXIMAL HUMERUS FRACTURE;  Surgeon: Budd Palmer, MD;  Location: [facility] OR;  Service: Orthopedics;  Laterality: Left;  . Humerus fracture surgery Left 11/04/11    with plate to the shoulder  . Posterior cervical fusion/foraminotomy N/A 03/21/2012    Procedure: POSTERIOR CERVICAL FUSION/FORAMINOTOMY LEVEL ONE;  Surgeon: Temple Pacini, MD;   Location: [facility] NEURO ORS;  Service: Neurosurgery;  Laterality: N/A;  POSTERIOR CERVICAL ONE TO CERVICAL TWO FUSION WITH ILIAC CREST GRAFT AND LATERAL MASS SCREWS    History  Substance Use Topics  . Smoking status: Never Smoker   . Smokeless tobacco: Never Used     Comment: quit 11/02/2011  . Alcohol Use: 540.0 oz/week    900 Glasses of wine per week     Comment: 11/02/2011 "large box of wine q 2 days"   Family History  Problem Relation Age of Onset  . Heart disease Father   . Diabetes Father   . Stroke Father   . Depression Mother   . Heart disease Brother   . Alcohol abuse Brother   . Liver disease Brother  Allergies  Allergen Reactions  . Nifedipine     REACTION: SWELLING 11/02/2011 pt denies this allergy.   Current Outpatient Prescriptions on File Prior to Visit  Medication Sig Dispense Refill  . albuterol (PROVENTIL) (5 MG/ML) 0.5% nebulizer solution Take 2.5 mg by nebulization every 2 (two) hours as needed for wheezing or shortness of breath.      . B Complex-C-Folic Acid (VITA-BEE/C PO) Take 1 tablet by mouth daily.      . cyclobenzaprine (FLEXERIL) 10 MG tablet TAKE 1 TABLET (10 MG TOTAL) BY MOUTH 2 (TWO) TIMES DAILY AS NEEDED.  60 tablet  0  . ferrous sulfate 325 (65 FE) MG tablet Take 1 tablet (325 mg total) by mouth 2 (two) times daily with a meal.  30 tablet  3  . fexofenadine (ALLEGRA) 180 MG tablet Take 180 mg by mouth daily as needed. For allergies      . fluticasone (FLONASE) 50 MCG/ACT nasal spray Place 2 sprays into the nose as needed. For allergies      . folic acid (FOLVITE) 1 MG tablet Take 1 mg by mouth daily.      [name] gabapentin (NEURONTIN) 300 MG capsule TAKE 1 CAPSULE (300 MG TOTAL) BY MOUTH 4 (FOUR) TIMES DAILY.  120 capsule  0  . Magnesium 500 MG TABS Take 500 mg by mouth daily.       . methocarbamol (ROBAXIN) 500 MG tablet TAKE 1 TABLET (500 MG TOTAL) BY MOUTH 3 (THREE) TIMES DAILY AS NEEDED (FOR PAIN AND SPASMS).  90 tablet  0  . Misc Natural Products  (OSTEO BI-FLEX JOINT SHIELD PO) Take 2 tablets by mouth daily.       . Multiple Vitamins-Minerals (CERTA-VITE PO) Take 1 tablet by mouth daily.      [name] oxyCODONE (OXY IR/ROXICODONE) 5 MG immediate release tablet Take 2 tablets (10 mg total) by mouth every 6 (six) hours as needed for pain.  30 tablet  0  . oxyCODONE-acetaminophen (PERCOCET/ROXICET) 5-325 MG per tablet Take 1-2 tablets by mouth every 4 (four) hours as needed for pain.  10 tablet  0  . pantoprazole (PROTONIX) 40 MG tablet Take 1 tablet (40 mg total) by mouth 2 (two) times daily.  60 tablet  5  . QUEtiapine (SEROQUEL) 300 MG tablet TAKE ONE-HALF TABLET (150 MG TOTAL) BY MOUTH AT BEDTIME.  15 tablet  0  . spironolactone (ALDACTONE) 50 MG tablet TAKE 1 TABLET BY MOUTH EVERY DAY  30 tablet  3  . Thiamine HCl (VITAMIN B-1) 100 MG tablet Take 100 mg by mouth daily.       . traZODone (DESYREL) 50 MG tablet Take 0.5-1 tablets (25-50 mg total) by mouth at bedtime as needed for sleep.  30 tablet  3  . zolpidem (AMBIEN) 5 MG tablet Take 1 tablet (5 mg total) by mouth at bedtime as needed for sleep.  30 tablet  0   No current facility-administered medications on file prior to visit.   The PMH, PSH, Social History, Family History, Medications, and allergies have been reviewed in [facility], and have been updated if relevant.  ROS: +diffuculty falling asleep  Physical exam: BP 120/80  Pulse 86  Temp(Src) 98 F (36.7 C)  Wt 200 lb (90.719 kg)  BMI 29.52 kg/m2  General: alert and well-developed, more color in his face today, wearing soft cervical collar Lungs: Normal respiratory effort, chest expands symmetrically. Decreased BS at bases bilaterally, right>left, no wheezing or crackles.  Heart: Normal rate and regular  rhythm. S1 and S2 normal without gallop, murmur, click, rub or other extra sounds.  Abdomen: some distension, pos BS, no tenderness, no guarding  Neurologic: alert & oriented X3.  Psych: normally interactive and good eye contact.   Ext: trace edema left ,no edema right  Assessment and Plan:  1. ALCOHOL ABUSE Reports he is no longer drinking.  2. Alcoholic cirrhosis of liver LFTs have been stable. Lab Results  Component Value Date   ALT 16 02/29/2012   AST 25 02/29/2012   ALKPHOS 53 02/29/2012   BILITOT 0.7 02/29/2012     3. Anemia due to blood loss, acute Improving.  Continue iron.   4. Anxiety state, unspecified Improved.  5. Insomnia Continue trazadone.  Still having difficulty but this seems more pain related.  6. Depression Improved with increased dose of seroquel.     7. C2 cervical fracture, sequela Now in soft collar, followed by Dr. Dutch Quint.  8. Pain in joint, shoulder region, left Followed by ortho.  Continue prn oxycodone, will add lidoderm patch as pain is localized.

## 2012-05-15 ENCOUNTER — Telehealth: Payer: Self-pay | Admitting: *Deleted

## 2012-05-15 ENCOUNTER — Other Ambulatory Visit: Payer: Self-pay | Admitting: Orthopedic Surgery

## 2012-05-15 DIAGNOSIS — M25512 Pain in left shoulder: Secondary | ICD-10-CM

## 2012-05-15 NOTE — Telephone Encounter (Signed)
Prior auth is needed for lidocaine patch, form is on your desk. 

## 2012-05-16 NOTE — Telephone Encounter (Signed)
Form signed and in my box. 

## 2012-05-16 NOTE — Telephone Encounter (Signed)
Completed form faxed back.

## 2012-05-24 ENCOUNTER — Other Ambulatory Visit: Payer: Medicare Other

## 2012-05-24 ENCOUNTER — Ambulatory Visit
Admission: RE | Admit: 2012-05-24 | Discharge: 2012-05-24 | Disposition: A | Discharge status: Home / Self Care | Payer: Medicare Other | Source: Ambulatory Visit | Attending: Orthopedic Surgery | Admitting: Orthopedic Surgery

## 2012-05-24 DIAGNOSIS — M25512 Pain in left shoulder: Secondary | ICD-10-CM

## 2012-05-24 DIAGNOSIS — M25519 Pain in unspecified shoulder: Secondary | ICD-10-CM | POA: Diagnosis not present

## 2012-05-24 MED ORDER — METHYLPREDNISOLONE ACETATE 40 MG/ML INJ SUSP (RADIOLOG: 120.0000 mg | Freq: Once | INTRAMUSCULAR | Status: DC

## 2012-05-24 MED ORDER — IOHEXOL 300 MG/ML  SOLN
15.0000 mL | Freq: Once | INTRAMUSCULAR | Status: AC | PRN
  Administered 2012-05-24: 10 mL via INTRA_ARTICULAR

## 2012-05-29 ENCOUNTER — Other Ambulatory Visit: Payer: Self-pay | Admitting: Family Medicine

## 2012-05-29 ENCOUNTER — Other Ambulatory Visit: Payer: Self-pay | Admitting: Internal Medicine

## 2012-06-01 DIAGNOSIS — M542 Cervicalgia: Secondary | ICD-10-CM | POA: Diagnosis not present

## 2012-06-01 DIAGNOSIS — S12100A Unspecified displaced fracture of second cervical vertebra, initial encounter for closed fracture: Secondary | ICD-10-CM | POA: Diagnosis not present

## 2012-06-08 ENCOUNTER — Other Ambulatory Visit: Payer: Self-pay | Admitting: Family Medicine

## 2012-06-13 ENCOUNTER — Other Ambulatory Visit: Payer: Self-pay | Admitting: Family Medicine

## 2012-06-20 ENCOUNTER — Encounter: Payer: Self-pay | Admitting: Radiology

## 2012-06-21 ENCOUNTER — Ambulatory Visit (INDEPENDENT_AMBULATORY_CARE_PROVIDER_SITE_OTHER): Payer: Medicare Other | Admitting: Family Medicine

## 2012-06-21 ENCOUNTER — Encounter: Payer: Self-pay | Admitting: Family Medicine

## 2012-06-21 VITALS — BP 140/84 | HR 88 | Temp 97.7°F | Wt 200.0 lb

## 2012-06-21 DIAGNOSIS — S39012A Strain of muscle, fascia and tendon of lower back, initial encounter: Secondary | ICD-10-CM

## 2012-06-21 DIAGNOSIS — S335XXA Sprain of ligaments of lumbar spine, initial encounter: Secondary | ICD-10-CM

## 2012-06-21 DIAGNOSIS — D539 Nutritional anemia, unspecified: Secondary | ICD-10-CM

## 2012-06-21 DIAGNOSIS — F411 Generalized anxiety disorder: Secondary | ICD-10-CM

## 2012-06-21 DIAGNOSIS — E785 Hyperlipidemia, unspecified: Secondary | ICD-10-CM

## 2012-06-21 DIAGNOSIS — K7030 Alcoholic cirrhosis of liver without ascites: Secondary | ICD-10-CM

## 2012-06-21 DIAGNOSIS — S12100S Unspecified displaced fracture of second cervical vertebra, sequela: Secondary | ICD-10-CM

## 2012-06-21 MED ORDER — METHOCARBAMOL 500 MG PO TABS: ORAL_TABLET | ORAL | Status: DC

## 2012-06-21 MED ORDER — LIDOCAINE 5 % EX PTCH: 1.0000 | MEDICATED_PATCH | CUTANEOUS | Status: DC

## 2012-06-21 NOTE — Progress Notes (Signed)
SUBJECTIVE:  [name] is a 67 y.o. male who complains of low back pain for 1 week(s), positional with bending or lifting, without radiation down the legs. Precipitating factors: recent heavy lifting. Prior history of back problems: recurrent self limited episodes of low back pain in the past. There is no numbness in the legs.  He has been using his oxycodone he has from his shoulder surgery which is not helping.  Patient Active Problem List   Diagnosis Date Noted  . Head injury, unspecified 04/12/2012  . Depression 04/12/2012  . C2 cervical fracture 02/29/2012  . Hypertrophy of male breast 02/29/2012  . Fracture of humerus, proximal, left, closed 11/02/2011  . Insect bites 07/01/2011  . Hyponatremia 11/17/2010  . H/O: upper GI bleed 10/20/2010  . Stomach ulcer from aspirin/ibuprofen-like drugs (NSAID's) 10/20/2010  . Gastritis and duodenitis 09/22/2010  . Colon polyp 08/26/2010  . Diverticulosis of colon (without mention of hemorrhage) 08/26/2010  . BPH (benign prostatic hyperplasia) 07/02/2010  . UNSPECIFIED DEFICIENCY ANEMIA 03/09/2010  . Hyposmolality and/or hyponatremia 10/17/2009  . MEMORY LOSS 10/14/2009  . ALCOHOLIC CIRRHOSIS OF LIVER 07/25/2009  . CERVICAL MUSCLE STRAIN 02/19/2008  . HYPOKALEMIA 04/03/2007  . ANXIETY STATE, UNSPECIFIED 01/24/2007  . HYPERLIPIDEMIA 07/28/2006  . ALCOHOL ABUSE 07/28/2006  . HYPERTENSION 07/28/2006  . HYDROCELES, BILATERAL 07/28/2006  . IMPOTENCE, ORGANIC ORIGIN 07/28/2006  . ROSACEA 07/28/2006  . HYPERGLYCEMIA 07/28/2006   Past Medical History  Diagnosis Date  . HLD (hyperlipidemia) 5/99  . HTN (hypertension) 5/99  . BPH (benign prostatic hypertrophy) 5/99  . GERD (gastroesophageal reflux disease) 07/31/04    gastritis   . Anxiety   . Allergy     pollen/ragweed  . Adenomatous polyp of colon 2006  . Hearing difficulty   . Humerus fracture 11/02/2011    LUE  . Peripheral vascular disease   . Pneumonia 1996    hosp  .  Chronic bronchitis     "q year or q other year" (11/02/2011)  . History of blood transfusion 2012  . Anemia 2012  . Hernia, umbilical     "unrepaired" (11/02/2011)  . H/O hiatal hernia   . Memory loss     "recently has been forgetting alot; maybe related to the alcohol" (11/02/2011)  . Falls frequently     "daily lately; legs just give out" (11/02/2011)  . H/O nausea   . Depression     Hx of  . Shortness of breath     with exertion  . Seasonal allergies    Past Surgical History  Procedure Laterality Date  . L duputryen contractive surgery    . Neck mri  6/04    C5-6 disc abnormality  . Colonoscopy  07/31/04    multiple polyps; repeat in 3 years  . Hosp cp r/o'd 2/24  03/08/07  . Ett myoview  03/09/07    nml EF 66%  . Upper gastrointestinal endoscopy    . Cataract extraction w/ intraocular lens  implant, bilateral  10/2002  . Tonsillectomy      "I was young" (11/02/2011)  . Hemorrhoid surgery      "cauterized a long time ago" (11/02/2011)  . Esophageal varice ligation  2012  . Orif humerus fracture  11/04/2011    Procedure: OPEN REDUCTION INTERNAL FIXATION (ORIF) PROXIMAL HUMERUS FRACTURE;  Surgeon: Budd Palmer, MD;  Location: [facility] OR;  Service: Orthopedics;  Laterality: Left;  . Humerus fracture surgery Left 11/04/11    with plate to the shoulder  .  Posterior cervical fusion/foraminotomy N/A 03/21/2012    Procedure: POSTERIOR CERVICAL FUSION/FORAMINOTOMY LEVEL ONE;  Surgeon: Temple Pacini, MD;  Location: [facility] NEURO ORS;  Service: Neurosurgery;  Laterality: N/A;  POSTERIOR CERVICAL ONE TO CERVICAL TWO FUSION WITH ILIAC CREST GRAFT AND LATERAL MASS SCREWS    History  Substance Use Topics  . Smoking status: Never Smoker   . Smokeless tobacco: Never Used     Comment: quit 11/02/2011  . Alcohol Use: 540.0 oz/week    900 Glasses of wine per week     Comment: 11/02/2011 "large box of wine q 2 days"   Family History  Problem Relation Age of Onset  . Heart disease Father   .  Diabetes Father   . Stroke Father   . Depression Mother   . Heart disease Brother   . Alcohol abuse Brother   . Liver disease Brother    Allergies  Allergen Reactions  . Nifedipine     REACTION: SWELLING 11/02/2011 pt denies this allergy.   Current Outpatient Prescriptions on File Prior to Visit  Medication Sig Dispense Refill  . albuterol (PROVENTIL) (5 MG/ML) 0.5% nebulizer solution Take 2.5 mg by nebulization every 2 (two) hours as needed for wheezing or shortness of breath.      . B Complex-C-Folic Acid (VITA-BEE/C PO) Take 1 tablet by mouth daily.      . cyclobenzaprine (FLEXERIL) 10 MG tablet TAKE 1 TABLET (10 MG TOTAL) BY MOUTH 2 (TWO) TIMES DAILY AS NEEDED.  60 tablet  0  . ferrous sulfate 325 (65 FE) MG tablet Take 1 tablet (325 mg total) by mouth 2 (two) times daily with a meal.  30 tablet  3  . fexofenadine (ALLEGRA) 180 MG tablet Take 180 mg by mouth daily as needed. For allergies      . fluticasone (FLONASE) 50 MCG/ACT nasal spray Place 2 sprays into the nose as needed. For allergies      . folic acid (FOLVITE) 1 MG tablet Take 1 mg by mouth daily.      [name] gabapentin (NEURONTIN) 300 MG capsule TAKE 1 CAPSULE (300 MG TOTAL) BY MOUTH 4 (FOUR) TIMES DAILY.  120 capsule  0  . Magnesium 500 MG TABS Take 500 mg by mouth daily.       . Magnesium Oxide 500 MG TABS TAKE 1 TABLET BY MOUTH TWICE A DAY  60 tablet  5  . Misc Natural Products (OSTEO BI-FLEX JOINT SHIELD PO) Take 2 tablets by mouth daily.       . Multiple Vitamins-Minerals (CERTA-VITE PO) Take 1 tablet by mouth daily.      [name] oxyCODONE (OXY IR/ROXICODONE) 5 MG immediate release tablet Take 2 tablets (10 mg total) by mouth every 6 (six) hours as needed for pain.  30 tablet  0  . pantoprazole (PROTONIX) 40 MG tablet Take 1 tablet (40 mg total) by mouth 2 (two) times daily.  60 tablet  5  . QUEtiapine (SEROQUEL) 300 MG tablet TAKE ONE-HALF TABLET (150 MG TOTAL) BY MOUTH AT BEDTIME.  15 tablet  0  . spironolactone (ALDACTONE)  50 MG tablet TAKE 1 TABLET BY MOUTH EVERY DAY  30 tablet  3  . Thiamine HCl (VITAMIN B-1) 100 MG tablet Take 100 mg by mouth daily.       . traZODone (DESYREL) 50 MG tablet Take 0.5-1 tablets (25-50 mg total) by mouth at bedtime as needed for sleep.  30 tablet  3  . zolpidem (AMBIEN) 5 MG tablet Take  1 tablet (5 mg total) by mouth at bedtime as needed for sleep.  30 tablet  0   No current facility-administered medications on file prior to visit.   The PMH, PSH, Social History, Family History, Medications, and allergies have been reviewed in [facility], and have been updated if relevant.   OBJECTIVE: BP 140/84  Pulse 88  Temp(Src) 97.7 F (36.5 C)  Wt 200 lb (90.719 kg)  BMI 29.52 kg/m2  Patient appears to be in mild to moderate pain, antalgic gait noted. Lumbosacral spine area reveals no local tenderness or mass.  Painful and reduced LS ROM noted. Straight leg raise is negative bilaterally. DTR's, motor strength and sensation normal, including heel and toe gait.  Peripheral pulses are palpable. X-Ray: not indicated.  ASSESSMENT:  lumbar strain  PLAN: For acute pain, rest, intermittent application of heat (do not sleep on heating pad), analgesics and muscle relaxants are recommended. Discussed longer term treatment plan of prn NSAID's and discussed a home back care exercise program with flexion exercise routine. Proper lifting with avoidance of heavy lifting discussed. Consider Physical Therapy and XRay studies if not improving. Call or return to clinic prn if these symptoms worsen or fail to improve as anticipated.

## 2012-06-21 NOTE — Patient Instructions (Addendum)
Good to see you. It looks like you strained your back. Take Ibuprofen( WITH FOOD)- ok to take up to 800 mg three times daily.  Take Robaxin as directed.  Try a heating pad.

## 2012-06-23 DIAGNOSIS — Z79899 Other long term (current) drug therapy: Secondary | ICD-10-CM | POA: Diagnosis not present

## 2012-06-29 ENCOUNTER — Other Ambulatory Visit: Payer: Self-pay | Admitting: Family Medicine

## 2012-07-03 ENCOUNTER — Ambulatory Visit (INDEPENDENT_AMBULATORY_CARE_PROVIDER_SITE_OTHER)
Admission: RE | Admit: 2012-07-03 | Discharge: 2012-07-03 | Disposition: A | Discharge status: Home / Self Care | Payer: Medicare Other | Source: Ambulatory Visit | Attending: Family Medicine | Admitting: Family Medicine

## 2012-07-03 ENCOUNTER — Encounter: Payer: Self-pay | Admitting: Family Medicine

## 2012-07-03 ENCOUNTER — Ambulatory Visit (INDEPENDENT_AMBULATORY_CARE_PROVIDER_SITE_OTHER): Payer: TRICARE For Life (TFL) | Admitting: Family Medicine

## 2012-07-03 VITALS — BP 156/80 | HR 92 | Temp 98.7°F | Wt 196.2 lb

## 2012-07-03 DIAGNOSIS — M79609 Pain in unspecified limb: Secondary | ICD-10-CM

## 2012-07-03 DIAGNOSIS — M79602 Pain in left arm: Secondary | ICD-10-CM

## 2012-07-03 DIAGNOSIS — R112 Nausea with vomiting, unspecified: Secondary | ICD-10-CM

## 2012-07-03 DIAGNOSIS — S59919A Unspecified injury of unspecified forearm, initial encounter: Secondary | ICD-10-CM | POA: Diagnosis not present

## 2012-07-03 DIAGNOSIS — S59909A Unspecified injury of unspecified elbow, initial encounter: Secondary | ICD-10-CM | POA: Diagnosis not present

## 2012-07-03 DIAGNOSIS — M25539 Pain in unspecified wrist: Secondary | ICD-10-CM | POA: Diagnosis not present

## 2012-07-03 MED ORDER — PROMETHAZINE HCL 25 MG PO TABS: 25.0000 mg | ORAL_TABLET | Freq: Four times a day (QID) | ORAL | Status: DC | PRN

## 2012-07-03 NOTE — Progress Notes (Signed)
L arm- dropped a ~20 lb block on his L forearm ~5 days ago.  Painful.  He can move his hand and elbow.   He was worried about infection on his L forearm.  He had a temp of ~100 on Friday.  L forearm tender in the interval.   In the interval with nausea and vomiting (last vomited this AM), had some diarrhea, then took some pepto.  Complains of post nasal gtt and that may have affected/influenced his diarrhea.  Still with nausea and vomiting, but diarrhea resolved.  No known sick contacts.   Taking ibuprofen 800mg  a day.    OBTW- Out of oxycodone.  This had come through Dr. Dayton Martes.  I talked with her.  Pt had stopped it since it wasn't helping.    Meds, vitals, and allergies reviewed.   ROS: See HPI.  Otherwise, noncontributory.  nad ncat Tm wnl Nasal and OP exam wnl Neck supple, no LA rrr ctab abd soft, not ttp normal BS, soft umbilical hernia noted L forearm with tenderness and puffiness on the mid radium, local bruising but this doesn't appear infected.  Normal ROM at the wrist and hand.  Bruising on the dorsum on the L hand, not on plamar side.

## 2012-07-03 NOTE — Patient Instructions (Addendum)
Your xray look okay.  I would put ice on your forearm intermittently throughout the day.  You'll need to discuss your oxycodone/pain medicine with Dr. Dayton Martes.  Use the phenergan for nausea and it should gradually get better.  Take care.

## 2012-07-04 ENCOUNTER — Encounter: Payer: Self-pay | Admitting: Family Medicine

## 2012-07-04 DIAGNOSIS — M79602 Pain in left arm: Secondary | ICD-10-CM | POA: Insufficient documentation

## 2012-07-04 DIAGNOSIS — R112 Nausea with vomiting, unspecified: Secondary | ICD-10-CM | POA: Insufficient documentation

## 2012-07-04 NOTE — Assessment & Plan Note (Signed)
Reassuring xrays, likely local swelling w/o other complication.  Would ice this and it should improve.  I will defer to PCP about his long term pain tx.  I didn't rx oxycodone. >25 min spent with face to face with patient, >50% counseling and/or coordinating care

## 2012-07-04 NOTE — Assessment & Plan Note (Signed)
Likely with fever from unrelated GI illness, ie not related to his arm sx.  Would use phenergan and f/u prn.  Should resolve.

## 2012-07-17 ENCOUNTER — Ambulatory Visit (INDEPENDENT_AMBULATORY_CARE_PROVIDER_SITE_OTHER)
Admission: RE | Admit: 2012-07-17 | Discharge: 2012-07-17 | Disposition: A | Discharge status: Home / Self Care | Payer: Medicare Other | Source: Ambulatory Visit | Attending: Family Medicine | Admitting: Family Medicine

## 2012-07-17 ENCOUNTER — Ambulatory Visit (INDEPENDENT_AMBULATORY_CARE_PROVIDER_SITE_OTHER): Payer: Medicare Other | Admitting: Family Medicine

## 2012-07-17 ENCOUNTER — Encounter: Payer: Self-pay | Admitting: Family Medicine

## 2012-07-17 VITALS — BP 140/80 | HR 96 | Temp 98.1°F | Wt 194.0 lb

## 2012-07-17 DIAGNOSIS — M79602 Pain in left arm: Secondary | ICD-10-CM

## 2012-07-17 DIAGNOSIS — Z23 Encounter for immunization: Secondary | ICD-10-CM

## 2012-07-17 DIAGNOSIS — M79609 Pain in unspecified limb: Secondary | ICD-10-CM

## 2012-07-17 DIAGNOSIS — S59909A Unspecified injury of unspecified elbow, initial encounter: Secondary | ICD-10-CM | POA: Diagnosis not present

## 2012-07-17 MED ORDER — LIDOCAINE 5 % EX PTCH: 1.0000 | MEDICATED_PATCH | CUTANEOUS | Status: DC

## 2012-07-17 MED ORDER — PROMETHAZINE HCL 25 MG PO TABS: 25.0000 mg | ORAL_TABLET | Freq: Four times a day (QID) | ORAL | Status: DC | PRN

## 2012-07-17 NOTE — Patient Instructions (Addendum)
Good to see you. We will call you with your xray results.  Say hi to [facility] for me.

## 2012-07-17 NOTE — Addendum Note (Signed)
Addended by: Eliezer Bottom on: 07/17/2012 12:03 PM   Modules accepted: Orders

## 2012-07-17 NOTE — Progress Notes (Signed)
67 yo here for persistent left arm pain.  Saw Dr. Para March for this complaint on 6/23 after he dropped a ~20 lb block on his L forearm 5 days prior. Note reviewed.   Painful. He did have low grade temp initially which had resolved.  Xray neg.    Dg Forearm Left  07/03/2012   *RADIOLOGY REPORT*  Clinical Data: 67 year old male left arm pain status post trauma.  LEFT FOREARM - 2 VIEW  Comparison: Left humerus series 04/02/2012.  Findings: Calcified atherosclerosis in the left forearm and wrist. Ulna minus variance.  No definite joint effusion at the left elbow. Bone mineralization is within normal limits for age.  Radius and ulna intact.  No subcutaneous gas. No radiopaque foreign body identified.  IMPRESSION: No acute osseous abnormality identified about the left forearm.   Original Report Authenticated By: Erskine Speed, M.D.    Patient Active Problem List   Diagnosis Date Noted  . Left arm pain 07/17/2012  . Pain of left arm 07/04/2012  . Nausea with vomiting 07/04/2012  . Head injury, unspecified 04/12/2012  . Depression 04/12/2012  . C2 cervical fracture 02/29/2012  . Hypertrophy of male breast 02/29/2012  . Fracture of humerus, proximal, left, closed 11/02/2011  . Insect bites 07/01/2011  . Hyponatremia 11/17/2010  . H/O: upper GI bleed 10/20/2010  . Stomach ulcer from aspirin/ibuprofen-like drugs (NSAID's) 10/20/2010  . Gastritis and duodenitis 09/22/2010  . Colon polyp 08/26/2010  . Diverticulosis of colon (without mention of hemorrhage) 08/26/2010  . BPH (benign prostatic hyperplasia) 07/02/2010  . UNSPECIFIED DEFICIENCY ANEMIA 03/09/2010  . Hyposmolality and/or hyponatremia 10/17/2009  . MEMORY LOSS 10/14/2009  . ALCOHOLIC CIRRHOSIS OF LIVER 07/25/2009  . CERVICAL MUSCLE STRAIN 02/19/2008  . HYPOKALEMIA 04/03/2007  . ANXIETY STATE, UNSPECIFIED 01/24/2007  . HYPERLIPIDEMIA 07/28/2006  . ALCOHOL ABUSE 07/28/2006  . HYPERTENSION 07/28/2006  . HYDROCELES, BILATERAL 07/28/2006   . IMPOTENCE, ORGANIC ORIGIN 07/28/2006  . ROSACEA 07/28/2006  . HYPERGLYCEMIA 07/28/2006   Past Medical History  Diagnosis Date  . HLD (hyperlipidemia) 5/99  . HTN (hypertension) 5/99  . BPH (benign prostatic hypertrophy) 5/99  . GERD (gastroesophageal reflux disease) 07/31/04    gastritis   . Anxiety   . Allergy     pollen/ragweed  . Adenomatous polyp of colon 2006  . Hearing difficulty   . Humerus fracture 11/02/2011    LUE  . Peripheral vascular disease   . Pneumonia 1996    hosp  . Chronic bronchitis     "q year or q other year" (11/02/2011)  . History of blood transfusion 2012  . Anemia 2012  . Hernia, umbilical     "unrepaired" (11/02/2011)  . H/O hiatal hernia   . Memory loss     "recently has been forgetting alot; maybe related to the alcohol" (11/02/2011)  . Falls frequently     "daily lately; legs just give out" (11/02/2011)  . H/O nausea   . Depression     Hx of  . Shortness of breath     with exertion  . Seasonal allergies    Past Surgical History  Procedure Laterality Date  . L duputryen contractive surgery    . Neck mri  6/04    C5-6 disc abnormality  . Colonoscopy  07/31/04    multiple polyps; repeat in 3 years  . Hosp cp r/o'd 2/24  03/08/07  . Ett myoview  03/09/07    nml EF 66%  . Upper gastrointestinal endoscopy    .  Cataract extraction w/ intraocular lens  implant, bilateral  10/2002  . Tonsillectomy      "I was young" (11/02/2011)  . Hemorrhoid surgery      "cauterized a long time ago" (11/02/2011)  . Esophageal varice ligation  2012  . Orif humerus fracture  11/04/2011    Procedure: OPEN REDUCTION INTERNAL FIXATION (ORIF) PROXIMAL HUMERUS FRACTURE;  Surgeon: Budd Palmer, MD;  Location: [facility] OR;  Service: Orthopedics;  Laterality: Left;  . Humerus fracture surgery Left 11/04/11    with plate to the shoulder  . Posterior cervical fusion/foraminotomy N/A 03/21/2012    Procedure: POSTERIOR CERVICAL FUSION/FORAMINOTOMY LEVEL ONE;  Surgeon:  Temple Pacini, MD;  Location: [facility];  Service: Neurosurgery;  Laterality: N/A;  POSTERIOR CERVICAL ONE TO CERVICAL TWO FUSION WITH ILIAC CREST GRAFT AND LATERAL MASS SCREWS    History  Substance Use Topics  . Smoking status: Never Smoker   . Smokeless tobacco: Never Used     Comment: quit 11/02/2011  . Alcohol Use: 540.0 oz/week    900 Glasses of wine per week     Comment: 11/02/2011 "large box of wine q 2 days"   Family History  Problem Relation Age of Onset  . Heart disease Father   . Diabetes Father   . Stroke Father   . Depression Mother   . Heart disease Brother   . Alcohol abuse Brother   . Liver disease Brother    Allergies  Allergen Reactions  . Nifedipine     REACTION: SWELLING 11/02/2011 pt denies this allergy.   Current Outpatient Prescriptions on File Prior to Visit  Medication Sig Dispense Refill  . albuterol (PROVENTIL) (5 MG/ML) 0.5% nebulizer solution Take 2.5 mg by nebulization every 2 (two) hours as needed for wheezing or shortness of breath.      . B Complex-C-Folic Acid (VITA-BEE/C PO) Take 1 tablet by mouth daily.      . cyclobenzaprine (FLEXERIL) 10 MG tablet TAKE 1 TABLET (10 MG TOTAL) BY MOUTH 2 (TWO) TIMES DAILY AS NEEDED.  60 tablet  0  . ferrous sulfate 325 (65 FE) MG tablet Take 1 tablet (325 mg total) by mouth 2 (two) times daily with a meal.  30 tablet  3  . fexofenadine (ALLEGRA) 180 MG tablet Take 180 mg by mouth daily as needed. For allergies      . fluticasone (FLONASE) 50 MCG/ACT nasal spray Place 2 sprays into the nose as needed. For allergies      . folic acid (FOLVITE) 1 MG tablet Take 1 mg by mouth daily.      [name] lidocaine (LIDODERM) 5 % Place 1 patch onto the skin daily. Remove & Discard patch within 12 hours or as directed by MD  30 patch  0  . Magnesium 500 MG TABS Take 500 mg by mouth daily.       . methocarbamol (ROBAXIN) 500 MG tablet TAKE 1 TABLET (500 MG TOTAL) BY MOUTH 3 (THREE) TIMES DAILY AS NEEDED (FOR PAIN AND SPASMS).  90  tablet  0  . Multiple Vitamins-Minerals (CERTA-VITE PO) Take 1 tablet by mouth daily.      . pantoprazole (PROTONIX) 40 MG tablet Take 1 tablet (40 mg total) by mouth 2 (two) times daily.  60 tablet  5  . promethazine (PHENERGAN) 25 MG tablet Take 1 tablet (25 mg total) by mouth every 6 (six) hours as needed. For nausea.  30 tablet  0  . QUEtiapine (SEROQUEL) 300 MG tablet  TAKE ONE-HALF TABLET (150 MG TOTAL) BY MOUTH AT BEDTIME.  15 tablet  0  . spironolactone (ALDACTONE) 50 MG tablet TAKE 1 TABLET BY MOUTH EVERY DAY  30 tablet  3  . Thiamine HCl (VITAMIN B-1) 100 MG tablet Take 100 mg by mouth daily.       . traZODone (DESYREL) 50 MG tablet Take 0.5-1 tablets (25-50 mg total) by mouth at bedtime as needed for sleep.  30 tablet  3  . zolpidem (AMBIEN) 5 MG tablet Take 1 tablet (5 mg total) by mouth at bedtime as needed for sleep.  30 tablet  0  . Misc Natural Products (OSTEO BI-FLEX JOINT SHIELD PO) Take 2 tablets by mouth daily.        No current facility-administered medications on file prior to visit.   The PMH, PSH, Social History, Family History, Medications, and allergies have been reviewed in [facility], and have been updated if relevant.   Meds, vitals, and allergies reviewed.   ROS: See HPI.  Otherwise, noncontributory.  Physical exam: BP 140/80  Pulse 96  Temp(Src) 98.1 F (36.7 C)  Wt 194 lb (87.998 kg)  BMI 28.64 kg/m2 Gen: alert pleasant, NAD L forearm with tenderness and puffiness on the mid radium,diffuse echymosis, no erythema or drainage Normal ROM at the wrist and hand.  Good grip strength bilaterally.  Assessment and Plan: 1. Left arm pain Persistent but does have localized TTP. Recheck forearm film today as first xray taken only 5 days after injury.  Rule out fx.  Reassurance provided. Tdap today. The patient indicates understanding of these issues and agrees with the plan.

## 2012-07-26 ENCOUNTER — Other Ambulatory Visit: Payer: Self-pay | Admitting: Family Medicine

## 2012-07-26 DIAGNOSIS — H524 Presbyopia: Secondary | ICD-10-CM | POA: Diagnosis not present

## 2012-07-26 DIAGNOSIS — H353190 Nonexudative age-related macular degeneration, unspecified eye, stage unspecified: Secondary | ICD-10-CM | POA: Diagnosis not present

## 2012-07-26 DIAGNOSIS — H40059 Ocular hypertension, unspecified eye: Secondary | ICD-10-CM | POA: Diagnosis not present

## 2012-07-26 DIAGNOSIS — H43819 Vitreous degeneration, unspecified eye: Secondary | ICD-10-CM | POA: Diagnosis not present

## 2012-07-26 DIAGNOSIS — Z961 Presence of intraocular lens: Secondary | ICD-10-CM | POA: Diagnosis not present

## 2012-08-02 DIAGNOSIS — S12100A Unspecified displaced fracture of second cervical vertebra, initial encounter for closed fracture: Secondary | ICD-10-CM | POA: Diagnosis not present

## 2012-08-02 DIAGNOSIS — E663 Overweight: Secondary | ICD-10-CM | POA: Diagnosis not present

## 2012-08-14 ENCOUNTER — Encounter: Payer: Self-pay | Admitting: Family Medicine

## 2012-08-14 ENCOUNTER — Ambulatory Visit (INDEPENDENT_AMBULATORY_CARE_PROVIDER_SITE_OTHER): Payer: Medicare Other | Admitting: Family Medicine

## 2012-08-14 ENCOUNTER — Telehealth: Payer: Self-pay

## 2012-08-14 VITALS — BP 144/80 | HR 88 | Temp 98.1°F | Ht 69.0 in | Wt 192.0 lb

## 2012-08-14 DIAGNOSIS — M79602 Pain in left arm: Secondary | ICD-10-CM

## 2012-08-14 DIAGNOSIS — M79609 Pain in unspecified limb: Secondary | ICD-10-CM | POA: Diagnosis not present

## 2012-08-14 DIAGNOSIS — K7030 Alcoholic cirrhosis of liver without ascites: Secondary | ICD-10-CM

## 2012-08-14 DIAGNOSIS — I10 Essential (primary) hypertension: Secondary | ICD-10-CM | POA: Diagnosis not present

## 2012-08-14 LAB — CBC WITH DIFFERENTIAL/PLATELET
Eosinophils Relative: 0.9 % (ref 0.0–5.0)
HCT: 44.9 % (ref 39.0–52.0)
Hemoglobin: 15.1 g/dL (ref 13.0–17.0)
Lymphs Abs: 1.3 10*3/uL (ref 0.7–4.0)
Monocytes Relative: 12.4 % — ABNORMAL HIGH (ref 3.0–12.0)
Platelets: 194 10*3/uL (ref 150.0–400.0)
WBC: 8.1 10*3/uL (ref 4.5–10.5)

## 2012-08-14 LAB — COMPREHENSIVE METABOLIC PANEL
AST: 29 U/L (ref 0–37)
Albumin: 4.6 g/dL (ref 3.5–5.2)
Alkaline Phosphatase: 96 U/L (ref 39–117)
BUN: 6 mg/dL (ref 6–23)
Calcium: 9.8 mg/dL (ref 8.4–10.5)
Chloride: 97 mEq/L (ref 96–112)
Glucose, Bld: 100 mg/dL — ABNORMAL HIGH (ref 70–99)
Potassium: 4.3 mEq/L (ref 3.5–5.1)
Sodium: 135 mEq/L (ref 135–145)
Total Protein: 8.1 g/dL (ref 6.0–8.3)

## 2012-08-14 MED ORDER — METHOCARBAMOL 500 MG PO TABS: ORAL_TABLET | ORAL | Status: DC

## 2012-08-14 MED ORDER — PANTOPRAZOLE SODIUM 40 MG PO TBEC: 40.0000 mg | DELAYED_RELEASE_TABLET | Freq: Two times a day (BID) | ORAL | Status: DC

## 2012-08-14 MED ORDER — GABAPENTIN 300 MG PO CAPS: ORAL_CAPSULE | ORAL | Status: DC

## 2012-08-14 MED ORDER — EPINEPHRINE 0.3 MG/0.3ML IJ SOAJ: 0.3000 mg | Freq: Once | INTRAMUSCULAR | Status: DC

## 2012-08-14 MED ORDER — QUETIAPINE FUMARATE 300 MG PO TABS: ORAL_TABLET | ORAL | Status: DC

## 2012-08-14 MED ORDER — TRAZODONE HCL 50 MG PO TABS: ORAL_TABLET | ORAL | Status: DC

## 2012-08-14 MED ORDER — LIDOCAINE 5 % EX PTCH: 1.0000 | MEDICATED_PATCH | CUTANEOUS | Status: DC

## 2012-08-14 MED ORDER — PROMETHAZINE HCL 25 MG PO TABS: 25.0000 mg | ORAL_TABLET | Freq: Four times a day (QID) | ORAL | Status: DC | PRN

## 2012-08-14 MED ORDER — SPIRONOLACTONE 50 MG PO TABS: ORAL_TABLET | ORAL | Status: DC

## 2012-08-14 NOTE — Telephone Encounter (Signed)
Rx sent 

## 2012-08-14 NOTE — Telephone Encounter (Signed)
Pt left v/m; was seen earlier today and went by [facility] for epipen for bee stings but rx not there; pt request epipen to [facility]. Pt does not need cb.

## 2012-08-14 NOTE — Progress Notes (Signed)
67 year old pleasant male well known to me with h/o alcoholic liver disease, depression and chronic hyponatremia here for three month follow up.  Has had a complicated history.    Several fractures and falls in 2013 due to alcohol intoxication.  Had ORIF of humerus and later had an odontoid fracture and C3 fx.    Discharged by Dr. Dutch Quint last week.    Most recently had a left arm injury earlier this summer (see Epic notes).  No fx.  Pain has resolved. Doing well.  Feels he is drinking less- 1-2 drinks per week.  When I saw him for hospital follow up last year, depression and insomnia had worsened.  We increased Seroquel to 300 mg daily and added Trazadone prn insomnia.   He feels much better.      Patient Active Problem List   Diagnosis Date Noted  . Left arm pain 07/17/2012  . Pain of left arm 07/04/2012  . Nausea with vomiting 07/04/2012  . Head injury, unspecified 04/12/2012  . Depression 04/12/2012  . C2 cervical fracture 02/29/2012  . Hypertrophy of male breast 02/29/2012  . Fracture of humerus, proximal, left, closed 11/02/2011  . Insect bites 07/01/2011  . Hyponatremia 11/17/2010  . H/O: upper GI bleed 10/20/2010  . Stomach ulcer from aspirin/ibuprofen-like drugs (NSAID's) 10/20/2010  . Gastritis and duodenitis 09/22/2010  . Colon polyp 08/26/2010  . Diverticulosis of colon (without mention of hemorrhage) 08/26/2010  . BPH (benign prostatic hyperplasia) 07/02/2010  . UNSPECIFIED DEFICIENCY ANEMIA 03/09/2010  . Hyposmolality and/or hyponatremia 10/17/2009  . MEMORY LOSS 10/14/2009  . ALCOHOLIC CIRRHOSIS OF LIVER 07/25/2009  . CERVICAL MUSCLE STRAIN 02/19/2008  . HYPOKALEMIA 04/03/2007  . ANXIETY STATE, UNSPECIFIED 01/24/2007  . HYPERLIPIDEMIA 07/28/2006  . ALCOHOL ABUSE 07/28/2006  . HYPERTENSION 07/28/2006  . HYDROCELES, BILATERAL 07/28/2006  . IMPOTENCE, ORGANIC ORIGIN 07/28/2006  . ROSACEA 07/28/2006  . HYPERGLYCEMIA 07/28/2006   Past Medical History   Diagnosis Date  . HLD (hyperlipidemia) 5/99  . HTN (hypertension) 5/99  . BPH (benign prostatic hypertrophy) 5/99  . GERD (gastroesophageal reflux disease) 07/31/04    gastritis   . Anxiety   . Allergy     pollen/ragweed  . Adenomatous polyp of colon 2006  . Hearing difficulty   . Humerus fracture 11/02/2011    LUE  . Peripheral vascular disease   . Pneumonia 1996    hosp  . Chronic bronchitis     "q year or q other year" (11/02/2011)  . History of blood transfusion 2012  . Anemia 2012  . Hernia, umbilical     "unrepaired" (11/02/2011)  . H/O hiatal hernia   . Memory loss     "recently has been forgetting alot; maybe related to the alcohol" (11/02/2011)  . Falls frequently     "daily lately; legs just give out" (11/02/2011)  . H/O nausea   . Depression     Hx of  . Shortness of breath     with exertion  . Seasonal allergies    Past Surgical History  Procedure Laterality Date  . L duputryen contractive surgery    . Neck mri  6/04    C5-6 disc abnormality  . Colonoscopy  07/31/04    multiple polyps; repeat in 3 years  . Hosp cp r/o'd 2/24  03/08/07  . Ett myoview  03/09/07    nml EF 66%  . Upper gastrointestinal endoscopy    . Cataract extraction w/ intraocular lens  implant, bilateral  10/2002  . Tonsillectomy      "  I was young" (11/02/2011)  . Hemorrhoid surgery      "cauterized a long time ago" (11/02/2011)  . Esophageal varice ligation  2012  . Orif humerus fracture  11/04/2011    Procedure: OPEN REDUCTION INTERNAL FIXATION (ORIF) PROXIMAL HUMERUS FRACTURE;  Surgeon: Budd Palmer, MD;  Location: [facility] OR;  Service: Orthopedics;  Laterality: Left;  . Humerus fracture surgery Left 11/04/11    with plate to the shoulder  . Posterior cervical fusion/foraminotomy N/A 03/21/2012    Procedure: POSTERIOR CERVICAL FUSION/FORAMINOTOMY LEVEL ONE;  Surgeon: Temple Pacini, MD;  Location: [facility];  Service: Neurosurgery;  Laterality: N/A;  POSTERIOR CERVICAL ONE TO  CERVICAL TWO FUSION WITH ILIAC CREST GRAFT AND LATERAL MASS SCREWS    History  Substance Use Topics  . Smoking status: Never Smoker   . Smokeless tobacco: Never Used     Comment: quit 11/02/2011  . Alcohol Use: 540.0 oz/week    900 Glasses of wine per week     Comment: 11/02/2011 "large box of wine q 2 days"   Family History  Problem Relation Age of Onset  . Heart disease Father   . Diabetes Father   . Stroke Father   . Depression Mother   . Heart disease Brother   . Alcohol abuse Brother   . Liver disease Brother    Allergies  Allergen Reactions  . Nifedipine     REACTION: SWELLING 11/02/2011 pt denies this allergy.   Current Outpatient Prescriptions on File Prior to Visit  Medication Sig Dispense Refill  . albuterol (PROVENTIL) (5 MG/ML) 0.5% nebulizer solution Take 2.5 mg by nebulization every 2 (two) hours as needed for wheezing or shortness of breath.      . B Complex-C-Folic Acid (VITA-BEE/C PO) Take 1 tablet by mouth daily.      . cyclobenzaprine (FLEXERIL) 10 MG tablet TAKE 1 TABLET (10 MG TOTAL) BY MOUTH 2 (TWO) TIMES DAILY AS NEEDED.  60 tablet  0  . ferrous sulfate 325 (65 FE) MG tablet Take 1 tablet (325 mg total) by mouth 2 (two) times daily with a meal.  30 tablet  3  . fexofenadine (ALLEGRA) 180 MG tablet Take 180 mg by mouth daily as needed. For allergies      . fluticasone (FLONASE) 50 MCG/ACT nasal spray Place 2 sprays into the nose as needed. For allergies      . folic acid (FOLVITE) 1 MG tablet Take 1 mg by mouth daily.      [name] lidocaine (LIDODERM) 5 % Place 1 patch onto the skin daily. Remove & Discard patch within 12 hours or as directed by MD  30 patch  0  . Magnesium 500 MG TABS Take 500 mg by mouth daily.       . methocarbamol (ROBAXIN) 500 MG tablet TAKE 1 TABLET (500 MG TOTAL) BY MOUTH 3 (THREE) TIMES DAILY AS NEEDED (FOR PAIN AND SPASMS).  90 tablet  0  . Misc Natural Products (OSTEO BI-FLEX JOINT SHIELD PO) Take 2 tablets by mouth daily.       .  Multiple Vitamins-Minerals (CERTA-VITE PO) Take 1 tablet by mouth daily.      . pantoprazole (PROTONIX) 40 MG tablet Take 1 tablet (40 mg total) by mouth 2 (two) times daily.  60 tablet  5  . promethazine (PHENERGAN) 25 MG tablet Take 1 tablet (25 mg total) by mouth every 6 (six) hours as needed. For nausea.  30 tablet  0  . QUEtiapine (SEROQUEL)  300 MG tablet TAKE ONE-HALF TABLET (150 MG TOTAL) BY MOUTH AT BEDTIME.  15 tablet  0  . spironolactone (ALDACTONE) 50 MG tablet TAKE 1 TABLET BY MOUTH EVERY DAY  30 tablet  5  . Thiamine HCl (VITAMIN B-1) 100 MG tablet Take 100 mg by mouth daily.       . traZODone (DESYREL) 50 MG tablet TAKE ONE-HALF OR ONE WHOLE (25-50 MG TOTAL) BY MOUTH AT BEDTIME AS NEEDED FOR SLEEP.  30 tablet  3  . zolpidem (AMBIEN) 5 MG tablet Take 1 tablet (5 mg total) by mouth at bedtime as needed for sleep.  30 tablet  0   No current facility-administered medications on file prior to visit.   The PMH, PSH, Social History, Family History, Medications, and allergies have been reviewed in [facility], and have been updated if relevant.  ROS: Sleeping well Denies anxiety or depression  Physical exam: BP 144/80  Pulse 88  Temp(Src) 98.1 F (36.7 C)  Ht 5\' 9"  (1.753 m)  Wt 192 lb (87.091 kg)  BMI 28.34 kg/m2 Wt Readings from Last 3 Encounters:  08/14/12 192 lb (87.091 kg)  07/17/12 194 lb (87.998 kg)  07/03/12 196 lb 4 oz (89.018 kg)    General: alert and well-developed, more color in his face today, wearing soft cervical collar Lungs: Normal respiratory effort, chest expands symmetrically. Decreased BS at bases bilaterally, right>left, no wheezing or crackles.  Heart: Normal rate and regular rhythm. S1 and S2 normal without gallop, murmur, click, rub or other extra sounds.  Abdomen: some distension, pos BS, no tenderness, no guarding  Neurologic: alert & oriented X3.  Psych: normally interactive and good eye contact.  Ext: trace edema bilaterally  Assessment and Plan: 1.  Left arm pain  Resolved.  2. Alcoholic cirrhosis of liver Recheck labs today. - CBC with Differential  3. HYPERTENSION Well controlled. - Comprehensive metabolic panel  4.  Depression Well controlled on current rx.  No changes.

## 2012-08-14 NOTE — Patient Instructions (Addendum)
Good to see you. Please schedule a physical at your convenience. Have fun at the beach.

## 2012-08-21 ENCOUNTER — Other Ambulatory Visit: Payer: Self-pay | Admitting: Family Medicine

## 2012-08-30 ENCOUNTER — Other Ambulatory Visit: Payer: Self-pay | Admitting: Family Medicine

## 2012-11-03 ENCOUNTER — Other Ambulatory Visit: Payer: Self-pay | Admitting: Family Medicine

## 2012-11-21 ENCOUNTER — Encounter: Payer: Self-pay | Admitting: Internal Medicine

## 2012-12-22 ENCOUNTER — Other Ambulatory Visit: Payer: Self-pay | Admitting: *Deleted

## 2012-12-22 MED ORDER — PANTOPRAZOLE SODIUM 40 MG PO TBEC: DELAYED_RELEASE_TABLET | ORAL | Status: DC

## 2012-12-22 MED ORDER — LIDOCAINE 5 % EX PTCH: 1.0000 | MEDICATED_PATCH | CUTANEOUS | Status: DC

## 2012-12-22 MED ORDER — SPIRONOLACTONE 50 MG PO TABS: ORAL_TABLET | ORAL | Status: DC

## 2012-12-22 NOTE — Telephone Encounter (Signed)
90 day refill request for mail order pharmacy.

## 2013-01-09 ENCOUNTER — Ambulatory Visit (INDEPENDENT_AMBULATORY_CARE_PROVIDER_SITE_OTHER): Payer: Medicare Other | Admitting: Family Medicine

## 2013-01-09 ENCOUNTER — Ambulatory Visit (INDEPENDENT_AMBULATORY_CARE_PROVIDER_SITE_OTHER)
Admission: RE | Admit: 2013-01-09 | Discharge: 2013-01-09 | Disposition: A | Discharge status: Home / Self Care | Payer: Medicare Other | Source: Ambulatory Visit | Attending: Family Medicine | Admitting: Family Medicine

## 2013-01-09 VITALS — BP 164/92 | HR 103 | Temp 98.0°F | Ht 66.0 in | Wt 201.0 lb

## 2013-01-09 DIAGNOSIS — M79609 Pain in unspecified limb: Secondary | ICD-10-CM

## 2013-01-09 DIAGNOSIS — M25519 Pain in unspecified shoulder: Secondary | ICD-10-CM

## 2013-01-09 DIAGNOSIS — M25512 Pain in left shoulder: Secondary | ICD-10-CM

## 2013-01-09 DIAGNOSIS — I10 Essential (primary) hypertension: Secondary | ICD-10-CM

## 2013-01-09 DIAGNOSIS — M79602 Pain in left arm: Secondary | ICD-10-CM

## 2013-01-09 DIAGNOSIS — R062 Wheezing: Secondary | ICD-10-CM | POA: Diagnosis not present

## 2013-01-09 DIAGNOSIS — M19019 Primary osteoarthritis, unspecified shoulder: Secondary | ICD-10-CM | POA: Diagnosis not present

## 2013-01-09 MED ORDER — OXYCODONE-ACETAMINOPHEN 5-325 MG PO TABS: 1.0000 | ORAL_TABLET | ORAL | Status: DC | PRN
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
## 2013-01-09 NOTE — Patient Instructions (Signed)
Great to see you.  We will call you with your xray results today.  I may need your orthopedist to take a look at it as well. Keep using the patches.  Continue guaifenesin for your cough and drink plenty of fluids.  If your symptoms worsen or do not improve in the next week, please let me know.  Take your blood pressure medication when you get home.

## 2013-03-14 ENCOUNTER — Emergency Department (HOSPITAL_COMMUNITY)
Admission: EM | Admit: 2013-03-14 | Discharge: 2013-03-14 | Disposition: A | Discharge status: Home / Self Care | Payer: Medicare Other | Attending: Emergency Medicine | Admitting: Emergency Medicine

## 2013-03-14 DIAGNOSIS — F3289 Other specified depressive episodes: Secondary | ICD-10-CM | POA: Diagnosis not present

## 2013-03-14 DIAGNOSIS — Z8781 Personal history of (healed) traumatic fracture: Secondary | ICD-10-CM | POA: Diagnosis not present

## 2013-03-14 DIAGNOSIS — Z79899 Other long term (current) drug therapy: Secondary | ICD-10-CM | POA: Diagnosis not present

## 2013-03-14 DIAGNOSIS — Z860100 Personal history of colon polyps, unspecified: Secondary | ICD-10-CM | POA: Insufficient documentation

## 2013-03-14 DIAGNOSIS — M25519 Pain in unspecified shoulder: Secondary | ICD-10-CM | POA: Diagnosis not present

## 2013-03-14 DIAGNOSIS — Z8709 Personal history of other diseases of the respiratory system: Secondary | ICD-10-CM | POA: Insufficient documentation

## 2013-03-14 DIAGNOSIS — S46909A Unspecified injury of unspecified muscle, fascia and tendon at shoulder and upper arm level, unspecified arm, initial encounter: Secondary | ICD-10-CM | POA: Diagnosis not present

## 2013-03-14 DIAGNOSIS — I10 Essential (primary) hypertension: Secondary | ICD-10-CM | POA: Insufficient documentation

## 2013-03-14 DIAGNOSIS — S4980XA Other specified injuries of shoulder and upper arm, unspecified arm, initial encounter: Secondary | ICD-10-CM | POA: Insufficient documentation

## 2013-03-14 DIAGNOSIS — F329 Major depressive disorder, single episode, unspecified: Secondary | ICD-10-CM | POA: Insufficient documentation

## 2013-03-14 DIAGNOSIS — Z87448 Personal history of other diseases of urinary system: Secondary | ICD-10-CM | POA: Insufficient documentation

## 2013-03-14 DIAGNOSIS — Z862 Personal history of diseases of the blood and blood-forming organs and certain disorders involving the immune mechanism: Secondary | ICD-10-CM | POA: Diagnosis not present

## 2013-03-14 DIAGNOSIS — Z8701 Personal history of pneumonia (recurrent): Secondary | ICD-10-CM | POA: Diagnosis not present

## 2013-03-14 DIAGNOSIS — D649 Anemia, unspecified: Secondary | ICD-10-CM | POA: Insufficient documentation

## 2013-03-14 DIAGNOSIS — Y9389 Activity, other specified: Secondary | ICD-10-CM | POA: Insufficient documentation

## 2013-03-14 DIAGNOSIS — Z8669 Personal history of other diseases of the nervous system and sense organs: Secondary | ICD-10-CM | POA: Insufficient documentation

## 2013-03-14 DIAGNOSIS — F411 Generalized anxiety disorder: Secondary | ICD-10-CM | POA: Diagnosis not present

## 2013-03-14 DIAGNOSIS — Z8639 Personal history of other endocrine, nutritional and metabolic disease: Secondary | ICD-10-CM | POA: Diagnosis not present

## 2013-03-14 DIAGNOSIS — T1490XA Injury, unspecified, initial encounter: Secondary | ICD-10-CM | POA: Diagnosis not present

## 2013-03-14 DIAGNOSIS — K219 Gastro-esophageal reflux disease without esophagitis: Secondary | ICD-10-CM | POA: Diagnosis not present

## 2013-03-14 DIAGNOSIS — Y92410 Unspecified street and highway as the place of occurrence of the external cause: Secondary | ICD-10-CM | POA: Insufficient documentation

## 2013-03-14 MED ORDER — HYDROCODONE-ACETAMINOPHEN 5-325 MG PO TABS: 1.0000 | ORAL_TABLET | ORAL | Status: DC | PRN
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
## 2013-03-14 NOTE — ED Provider Notes (Signed)
Medical screening examination/treatment/procedure(s) were performed by non-physician practitioner and as supervising physician I was immediately available for consultation/collaboration.   EKG Interpretation None      Mackay Hanauer, MD, FACEP   Aariv Medlock L Dennys Guin, MD 03/14/13 1538 
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
## 2013-03-21 ENCOUNTER — Ambulatory Visit (INDEPENDENT_AMBULATORY_CARE_PROVIDER_SITE_OTHER): Payer: Medicare Other | Admitting: Family Medicine

## 2013-03-21 ENCOUNTER — Ambulatory Visit (INDEPENDENT_AMBULATORY_CARE_PROVIDER_SITE_OTHER)
Admission: RE | Admit: 2013-03-21 | Discharge: 2013-03-21 | Disposition: A | Discharge status: Home / Self Care | Payer: Medicare Other | Source: Ambulatory Visit | Attending: Family Medicine | Admitting: Family Medicine

## 2013-03-21 ENCOUNTER — Encounter: Payer: Self-pay | Admitting: Family Medicine

## 2013-03-21 VITALS — BP 128/84 | HR 108 | Temp 98.4°F | Wt 197.8 lb

## 2013-03-21 DIAGNOSIS — M542 Cervicalgia: Secondary | ICD-10-CM

## 2013-03-21 DIAGNOSIS — S0993XA Unspecified injury of face, initial encounter: Secondary | ICD-10-CM | POA: Diagnosis not present

## 2013-03-21 DIAGNOSIS — Z041 Encounter for examination and observation following transport accident: Secondary | ICD-10-CM | POA: Insufficient documentation

## 2013-03-21 DIAGNOSIS — Z043 Encounter for examination and observation following other accident: Secondary | ICD-10-CM | POA: Diagnosis not present

## 2013-03-21 DIAGNOSIS — S199XXA Unspecified injury of neck, initial encounter: Secondary | ICD-10-CM | POA: Diagnosis not present

## 2013-03-21 MED ORDER — SPIRONOLACTONE 50 MG PO TABS: 50.0000 mg | ORAL_TABLET | Freq: Every day | ORAL | Status: DC

## 2013-03-21 MED ORDER — PANTOPRAZOLE SODIUM 40 MG PO TBEC: 40.0000 mg | DELAYED_RELEASE_TABLET | Freq: Two times a day (BID) | ORAL | Status: DC

## 2013-03-21 NOTE — Progress Notes (Signed)
Pre visit review using our clinic review tool, if applicable. No additional management support is needed unless otherwise documented below in the visit note. 

## 2013-03-21 NOTE — Assessment & Plan Note (Signed)
Exam benign and reassuring. Likely MSK. Will get cervical xray given history. The patient indicates understanding of these issues and agrees with the plan.
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
## 2013-03-29 ENCOUNTER — Encounter: Payer: Self-pay | Admitting: Family Medicine

## 2013-03-29 ENCOUNTER — Ambulatory Visit (INDEPENDENT_AMBULATORY_CARE_PROVIDER_SITE_OTHER): Payer: Medicare Other | Admitting: Family Medicine

## 2013-03-29 VITALS — BP 122/72 | HR 111 | Temp 97.9°F | Wt 200.0 lb

## 2013-03-29 DIAGNOSIS — S139XXA Sprain of joints and ligaments of unspecified parts of neck, initial encounter: Secondary | ICD-10-CM | POA: Diagnosis not present
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
## 2013-03-29 NOTE — Progress Notes (Signed)
Pre-visit discussion using our clinic review tool. No additional management support is needed unless otherwise documented below in the visit note.  

## 2013-03-29 NOTE — Assessment & Plan Note (Signed)
Improving.  Not taking any meds at this point. Call or return to clinic prn if these symptoms worsen or fail to improve as anticipated.

## 2013-06-07 ENCOUNTER — Other Ambulatory Visit: Payer: Self-pay | Admitting: *Deleted

## 2013-06-07 MED ORDER — PROMETHAZINE HCL 25 MG PO TABS: 25.0000 mg | ORAL_TABLET | Freq: Every day | ORAL | Status: DC | PRN

## 2013-06-07 MED ORDER — METHOCARBAMOL 500 MG PO TABS: 500.0000 mg | ORAL_TABLET | Freq: Every day | ORAL | Status: DC | PRN

## 2013-06-07 MED ORDER — LIDOCAINE 5 % EX PTCH: 1.0000 | MEDICATED_PATCH | CUTANEOUS | Status: DC

## 2013-06-07 MED ORDER — TRAZODONE HCL 50 MG PO TABS
50.0000 mg | ORAL_TABLET | Freq: Every day | ORAL | Status: DC | PRN
Start: 2013-06-07 — End: 2013-08-18

## 2013-06-07 MED ORDER — QUETIAPINE FUMARATE 300 MG PO TABS: 300.0000 mg | ORAL_TABLET | Freq: Every day | ORAL | Status: DC

## 2013-06-07 NOTE — Telephone Encounter (Signed)
Pt requesting medication refills. Spoke to pt and per [employer] forms he dropped off, he is on automatic refill which are good through 07/2013. Pt insisted refills be sent; done as requested. Last ov 03/2013 with no future appts scheduled. Ok to fill?

## 2013-06-08 MED ORDER — LIDOCAINE 5 % EX PTCH: 1.0000 | MEDICATED_PATCH | CUTANEOUS | Status: DC

## 2013-06-08 NOTE — Addendum Note (Signed)
Addended by: Modena Nunnery on: 06/08/2013 02:10 PM   Modules accepted: Orders

## 2013-08-09 ENCOUNTER — Other Ambulatory Visit: Payer: Self-pay | Admitting: Family Medicine

## 2013-08-10 ENCOUNTER — Other Ambulatory Visit: Payer: Self-pay | Admitting: Family Medicine

## 2013-08-13 ENCOUNTER — Other Ambulatory Visit: Payer: Self-pay | Admitting: *Deleted

## 2013-08-13 MED ORDER — SPIRONOLACTONE 50 MG PO TABS: 50.0000 mg | ORAL_TABLET | Freq: Every day | ORAL | Status: DC

## 2013-08-17 ENCOUNTER — Other Ambulatory Visit: Payer: Self-pay | Admitting: Family Medicine

## 2013-08-17 MED ORDER — EPINEPHRINE 0.3 MG/0.3ML IJ SOAJ: INTRAMUSCULAR | Status: DC

## 2013-08-18 ENCOUNTER — Other Ambulatory Visit: Payer: Self-pay | Admitting: Family Medicine

## 2013-10-04 ENCOUNTER — Ambulatory Visit (INDEPENDENT_AMBULATORY_CARE_PROVIDER_SITE_OTHER): Payer: Medicare Other | Admitting: Family Medicine

## 2013-10-04 ENCOUNTER — Encounter: Payer: Self-pay | Admitting: Family Medicine

## 2013-10-04 VITALS — BP 148/80 | HR 114 | Temp 97.9°F | Wt 201.8 lb

## 2013-10-04 DIAGNOSIS — M79609 Pain in unspecified limb: Secondary | ICD-10-CM | POA: Diagnosis not present

## 2013-10-04 DIAGNOSIS — Z23 Encounter for immunization: Secondary | ICD-10-CM

## 2013-10-04 DIAGNOSIS — M79602 Pain in left arm: Secondary | ICD-10-CM

## 2013-10-04 NOTE — Progress Notes (Signed)
Pre visit review using our clinic review tool, if applicable. No additional management support is needed unless otherwise documented below in the visit note. 
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
## 2013-10-04 NOTE — Patient Instructions (Signed)
Good to see you. I am sorry you are hurting.  Please make an appointment with Dr. Lorelei Pont on your way out.

## 2013-10-04 NOTE — Progress Notes (Signed)
Subjective:   Patient ID: OCTAVIUS SHIN, male    DOB: 16-Sep-1945, 68 y.o.   MRN: [id number]  KHYREE CARILLO is a pleasant 68 y.o. year old male who presents to clinic today with Arm Pain and Nasal Congestion  on 10/04/2013  HPI: Has had a complicated history.  In brief, he unfortunately fractured his left humerus on 11/02/2011 after he had a fall at home, he admits to be intoxicated at the time. Required ORIF and was sent to [facility] for rehab. He unfortunately had a very complicated course there and on 11/17, he was found on the floor at [facility].  Head CT on 11/20showed a non displaced type 2 odontoid fracture and a non displaced/incomplete fracture of C3.  Referred to neurosurgeon, Dr. Hal Neer, who placed in a a hard cervical collar for 4 weeks. Follow up xrays in his office according to his wife showed good aligment, so cervical collar was continued.  CT of cervical spine was performed on 2/10/214 (reviewed in Epic), which showed no healing of C2 fracture. Neurosurg recommend posterior surgical fusion which was performed by Dr. Trenton Gammon on 03/21/12.    Left shoulder pain- was followed by Dr. Marcelino Scot.  Dr. Marcelino Scot stated that he has a "screw loose" and he may need to remove plate in left shoulder.  Last shoulder xray done here on 01/09/13 EXAM:  LEFT SHOULDER - 2+ VIEW  COMPARISON: 04/02/2012  FINDINGS:  Proximal humerus fixation. Redemonstration of screws projecting  through the cortex of the humeral head. Advanced glenohumeral joint  osteoarthritis. Visualized portion of the left hemithorax is normal.  IMPRESSION:  Proximal left humeral fixation with similar appearance of screws  extending through the cortex of the humeral head, terminating within  the joint space.   He has persistent left shoulder pain, wakes him up at night.  Some limited ROM but this is not his primary complaint.  Robaxin does sometimes lessen the pain but makes him feel "nervous."  Current Outpatient  Prescriptions on File Prior to Visit  Medication Sig Dispense Refill  . albuterol (PROVENTIL) (5 MG/ML) 0.5% nebulizer solution Take 2.5 mg by nebulization every 2 (two) hours as needed for wheezing or shortness of breath.      . B Complex-C-Folic Acid (VITA-BEE/C PO) Take 1 tablet by mouth daily.      . cyclobenzaprine (FLEXERIL) 10 MG tablet Take 10 mg by mouth daily as needed for muscle spasms.      [name] EPINEPHrine (EPIPEN 2-PAK) 0.3 mg/0.3 mL IJ SOAJ injection INJECT IN TO THE MUSCLE AS DIRECTED FOR SEVERE ALLERGICE REACTION  2 Device  1  . gabapentin (NEURONTIN) 300 MG capsule Take 300 mg by mouth 4 (four) times daily.      [name] ibuprofen (ADVIL,MOTRIN) 200 MG tablet Take 400 mg by mouth daily as needed.      . lidocaine (LIDODERM) 5 % Place 1 patch onto the skin daily. Remove & Discard patch within 12 hours or as directed by MD  90 patch  0  . Magnesium 500 MG TABS Take 500 mg by mouth daily.       . methocarbamol (ROBAXIN) 500 MG tablet Take 1 tablet (500 mg total) by mouth daily as needed.  90 tablet  0  . Multiple Vitamins-Minerals (CERTA-VITE PO) Take 1 tablet by mouth daily.      . pantoprazole (PROTONIX) 40 MG tablet Take 1 tablet (40 mg total) by mouth 2 (two) times daily.  90 tablet  0  .  promethazine (PHENERGAN) 25 MG tablet TAKE 1 TABLET DAILY AS NEEDED FOR NAUSEA  90 tablet  0  . PROTONIX 40 MG tablet TAKE 1 TABLET (40 MG TOTAL) TWICE A DAY  180 tablet  0  . QUEtiapine (SEROQUEL) 300 MG tablet TAKE 1 TABLET AT BEDTIME  90 tablet  3  . spironolactone (ALDACTONE) 50 MG tablet Take 1 tablet (50 mg total) by mouth daily.  90 tablet  1  . Thiamine HCl (VITAMIN B-1) 100 MG tablet Take 100 mg by mouth daily.       . traZODone (DESYREL) 50 MG tablet TAKE 1 TABLET DAILY AS NEEDED  90 tablet  0  . zolpidem (AMBIEN) 5 MG tablet Take 5 mg by mouth daily as needed for sleep.       No current facility-administered medications on file prior to visit.    Allergies  Allergen Reactions  . Bee  Venom Swelling  . Nifedipine     REACTION: SWELLING 11/02/2011 pt denies this allergy.    Past Medical History  Diagnosis Date  . HLD (hyperlipidemia) 5/99  . HTN (hypertension) 5/99  . BPH (benign prostatic hypertrophy) 5/99  . GERD (gastroesophageal reflux disease) 07/31/04    gastritis   . Anxiety   . Allergy     pollen/ragweed  . Adenomatous polyp of colon 2006  . Hearing difficulty   . Humerus fracture 11/02/2011    LUE  . Peripheral vascular disease   . Pneumonia 1996    hosp  . Chronic bronchitis     "q year or q other year" (11/02/2011)  . History of blood transfusion 2012  . Anemia 2012  . Hernia, umbilical     "unrepaired" (11/02/2011)  . H/O hiatal hernia   . Memory loss     "recently has been forgetting alot; maybe related to the alcohol" (11/02/2011)  . Falls frequently     "daily lately; legs just give out" (11/02/2011)  . H/O nausea   . Depression     Hx of  . Shortness of breath     with exertion  . Seasonal allergies     Past Surgical History  Procedure Laterality Date  . L duputryen contractive surgery    . Neck mri  6/04    C5-6 disc abnormality  . Colonoscopy  07/31/04    multiple polyps; repeat in 3 years  . Hosp cp r/o'd 2/24  03/08/07  . Ett myoview  03/09/07    nml EF 66%  . Upper gastrointestinal endoscopy    . Cataract extraction w/ intraocular lens  implant, bilateral  10/2002  . Tonsillectomy      "I was young" (11/02/2011)  . Hemorrhoid surgery      "cauterized a long time ago" (11/02/2011)  . Esophageal varice ligation  2012  . Orif humerus fracture  11/04/2011    Procedure: OPEN REDUCTION INTERNAL FIXATION (ORIF) PROXIMAL HUMERUS FRACTURE;  Surgeon: Rozanna Box, MD;  Location: [facility];  Service: Orthopedics;  Laterality: Left;  . Humerus fracture surgery Left 11/04/11    with plate to the shoulder  . Posterior cervical fusion/foraminotomy N/A 03/21/2012    Procedure: POSTERIOR CERVICAL FUSION/FORAMINOTOMY LEVEL ONE;  Surgeon:  Charlie Pitter, MD;  Location: [facility];  Service: Neurosurgery;  Laterality: N/A;  POSTERIOR CERVICAL ONE TO CERVICAL TWO FUSION WITH ILIAC CREST GRAFT AND LATERAL MASS SCREWS     Family History  Problem Relation Age of Onset  . Heart disease Father   .  Diabetes Father   . Stroke Father   . Depression Mother   . Heart disease Brother   . Alcohol abuse Brother   . Liver disease Brother     History   Social History  . Marital Status: Married    Spouse Name: N/A    Number of Children: 3  . Years of Education: N/A   Occupational History  . color matcher    Social History Main Topics  . Smoking status: Never Smoker   . Smokeless tobacco: Never Used     Comment: quit 11/02/2011  . Alcohol Use: 540.0 oz/week    900 Glasses of wine per week     Comment: 11/02/2011 "large box of wine q 2 days"  . Drug Use: No  . Sexual Activity: No   Other Topics Concern  . Not on file   Social History Narrative   Married (remarried), lives with wife; 3 children, 1 at home.    [employer] - mixes colors       Pt revised designated party release form Danyon Mcginness, wife [phone] - can leave msg.     Allen Norris 03/04/10.     The PMH, PSH, Social History, Family History, Medications, and allergies have been reviewed in [facility], and have been updated if relevant.   Review of Systems    See HPI No CP No SOB No decreased left grip strength Does sometimes have some LLE edema, none today Objective:    BP 148/80  Pulse 114  Temp(Src) 97.9 F (36.6 C) (Oral)  Wt 201 lb 12 oz (91.513 kg)  SpO2 98%   Physical Exam   General: alert and well-developed, more color in his face today  Lungs: Normal respiratory effort, chest expands symmetrically. Decreased BS at bases bilaterally, no wheezing or crackles.  Heart: Normal rate and regular rhythm. S1 and S2 normal without gallop, murmur, click, rub or other extra sounds.  Abdomen: some distension, pos BS, no tenderness, no guarding   Neurologic: alert & oriented X3.  Psych: normally interactive and good eye contact.  MSK: FROM of left shoulder. Does have some TTP over [facility] joint with bruising/discoloration on his left arm.      Assessment & Plan:   No diagnosis found. No Follow-up on file.

## 2013-10-10 ENCOUNTER — Encounter: Payer: Self-pay | Admitting: Family Medicine

## 2013-10-10 ENCOUNTER — Ambulatory Visit (INDEPENDENT_AMBULATORY_CARE_PROVIDER_SITE_OTHER): Payer: Medicare Other | Admitting: Family Medicine

## 2013-10-10 ENCOUNTER — Ambulatory Visit (INDEPENDENT_AMBULATORY_CARE_PROVIDER_SITE_OTHER)
Admission: RE | Admit: 2013-10-10 | Discharge: 2013-10-10 | Disposition: A | Discharge status: Home / Self Care | Payer: Medicare Other | Source: Ambulatory Visit | Attending: Family Medicine | Admitting: Family Medicine

## 2013-10-10 VITALS — BP 126/78 | HR 108 | Temp 98.2°F | Ht 66.0 in | Wt 195.2 lb

## 2013-10-10 DIAGNOSIS — S42202S Unspecified fracture of upper end of left humerus, sequela: Secondary | ICD-10-CM

## 2013-10-10 DIAGNOSIS — S42309S Unspecified fracture of shaft of humerus, unspecified arm, sequela: Secondary | ICD-10-CM | POA: Diagnosis not present

## 2013-10-10 DIAGNOSIS — M25512 Pain in left shoulder: Secondary | ICD-10-CM

## 2013-10-10 DIAGNOSIS — M25519 Pain in unspecified shoulder: Secondary | ICD-10-CM | POA: Diagnosis not present

## 2013-10-10 MED ORDER — METHYLPREDNISOLONE ACETATE 40 MG/ML IJ SUSP
80.0000 mg | Freq: Once | INTRAMUSCULAR | Status: AC
  Administered 2013-10-10: 80 mg via INTRA_ARTICULAR

## 2013-10-10 NOTE — Progress Notes (Signed)
Pre visit review using our clinic review tool, if applicable. No additional management support is needed unless otherwise documented below in the visit note. 

## 2013-10-10 NOTE — Progress Notes (Signed)
Dr. Frederico Hamman T. Patric Buckhalter, MD, [facility] [place], [address] Phone: [phone] Fax: [phone]  10/10/2013  Patient: [name], MRN: [id number], DOB: 1945-08-09, 68 y.o.  Primary Physician:  [name], MD  Chief Complaint: Arm Pain  Subjective:   [name] is a 68 y.o. very pleasant male patient who presents with the following:  Patient presents post complex L proximal humerus fracture including the tuberosities on 11/04/2011 with ORIF by Dr. Marcelino Scot with subequent complication, neck fracture, c1/2 fusion by Dr. Annette Stable for an odontoid fracture.  Consultation is requested by my partner for any kind of recommendation that I can make for the patient for function and pain management.   On reviewing the old films and new films, the patient has a least 2 screws that are approaching the glenohumeral joint space, and there in the glenohumeral joint space. There is also one that is not flush any longer.  The patient has significant pain and limitation in his motion in that LEFT shoulder.  He is limited somewhat in what he can take for paingiven that he is a chronic alcoholic with cirrhosis, and he also has had problems with ulcers and bleeding secondary to NSAIDs. Currently he is using a lidocaine patch sometimes.  Past Medical History, Surgical History, Social History, Family History, Problem List, Medications, and Allergies have been reviewed and updated if relevant.  GEN: No fevers, chills. Nontoxic. Primarily MSK c/o today. MSK: Detailed in the HPI GI: tolerating PO intake without difficulty Neuro: No numbness, parasthesias, or tingling associated. Otherwise the pertinent positives of the ROS are noted above.   Objective:   BP 126/78  Pulse 108  Temp(Src) 98.2 F (36.8 C) (Oral)  Ht 5\' 6"  (1.676 m)  Wt 195 lb 4 oz (88.565 kg)  BMI 31.53 kg/m2  SpO2 98%   GEN: WDWN, NAD, Non-toxic, Alert & Oriented x  3 HEENT: Atraumatic, Normocephalic.  Ears and Nose: No external deformity. EXTR: No clubbing/cyanosis/edema NEURO: Normal gait.  PSYCH: Normally interactive. Conversant. Not depressed or anxious appearing.  Calm demeanor.    Patient LEFT shoulder is nontender to palpation along the clavicle. Nontender at the a.c. Joint. He is mildly tender in the bicipital groove. There is a prominentscar.  Range of motion is notably limited. The patient can abduct his shoulder to 110actively. He can flex his shoulder to 105 actively. Internal and external rotation is fairly limited.  Strength testing is 4+ to 5/5 in all directions. No particular deficits.  Given his loss of motion, special testing not attempted.  Radiology: Dg Shoulder Left  10/10/2013   CLINICAL DATA:  Left shoulder pain.  EXAM: LEFT SHOULDER - 2+ VIEW  COMPARISON:  01/09/2013 .  FINDINGS: Postsurgical changes noted proximal left humerus, unchanged in appearance. Degenerative changes noted of the acromioclavicular and glenohumeral joints. No evidence of a fracture, dislocation, or separation P  IMPRESSION: 1. Stable postsurgical changes left humerus. 2. DJD, no acute abnormality .   Electronically Signed   By: Marcello Moores  Register   On: 10/10/2013 10:14    Assessment and Plan:   Left shoulder pain - Plan: DG Shoulder Left, methylPREDNISolone acetate (DEPO-MEDROL) injection 80 mg  Proximal humerus fracture, left, sequela - Plan: DG Shoulder Left  My recommendation to the patient was to have his surgical hardware removed so that he would have to have his screws removed from the glenohumeral joint. I suspect that this would make his shoulder  feel better, but there is no guarantee. This conversation could be had with the operating surgeon.  The patient is highly reluctant to do this given his complications with the original procedure. At this point, he is willing to try anything to help with his pain and functionality. We are going to try to  do an intra-articular injection to see if this helps with his pain and range of motion.  Given this patient's history and extreme amount of alcohol intake, I would avoid any kind of anti-inflammatories, narcotics, and anything with Tylenol. Lidocaine patches are another reasonable idea.  Work on ROM daily.  Intrarticular Shoulder Injection, LEFT Verbal consent was obtained from the patient. Risks including infection explained and contrasted with benefits and alternatives. Patient prepped with Chloraprep and Ethyl Chloride used for anesthesia. An intraarticular shoulder injection was performed using the posterior approach. The patient tolerated the procedure well and had decreased pain post injection. No complications. Injection: 8 cc of Lidocaine 1% and Depo-Medrol 80 mg. Needle: 22 gauge   Follow-up: No Follow-up on file.  New Prescriptions   No medications on file   Orders Placed This Encounter  Procedures  . DG Shoulder Left    Signed,  Frederico Hamman T. Cathryn Gallery, MD   Patient's Medications  New Prescriptions   No medications on file  Previous Medications   CYCLOBENZAPRINE (FLEXERIL) 10 MG TABLET    Take 10 mg by mouth daily as needed for muscle spasms.   EPINEPHRINE (EPIPEN 2-PAK) 0.3 MG/0.3 ML IJ SOAJ INJECTION    INJECT IN TO THE MUSCLE AS DIRECTED FOR SEVERE ALLERGICE REACTION   LIDOCAINE (LIDODERM) 5 %    Place 1 patch onto the skin daily. Remove & Discard patch within 12 hours or as directed by MD   MAGNESIUM 500 MG TABS    Take 500 mg by mouth daily.    METHOCARBAMOL (ROBAXIN) 500 MG TABLET    Take 1 tablet (500 mg total) by mouth daily as needed.   PROMETHAZINE (PHENERGAN) 25 MG TABLET    TAKE 1 TABLET DAILY AS NEEDED FOR NAUSEA   PROTONIX 40 MG TABLET    TAKE 1 TABLET (40 MG TOTAL) TWICE A DAY   QUETIAPINE (SEROQUEL) 300 MG TABLET    TAKE 1 TABLET AT BEDTIME   SPIRONOLACTONE (ALDACTONE) 50 MG TABLET    Take 1 tablet (50 mg total) by mouth daily.   THIAMINE HCL (VITAMIN B-1)  100 MG TABLET    Take 100 mg by mouth daily.    TRAZODONE (DESYREL) 50 MG TABLET    TAKE 1 TABLET DAILY AS NEEDED  Modified Medications   No medications on file  Discontinued Medications   ALBUTEROL (PROVENTIL) (5 MG/ML) 0.5% NEBULIZER SOLUTION    Take 2.5 mg by nebulization every 2 (two) hours as needed for wheezing or shortness of breath.   B COMPLEX-C-FOLIC ACID (VITA-BEE/C PO)    Take 1 tablet by mouth daily.   GABAPENTIN (NEURONTIN) 300 MG CAPSULE    Take 300 mg by mouth 4 (four) times daily.   IBUPROFEN (ADVIL,MOTRIN) 200 MG TABLET    Take 400 mg by mouth daily as needed.   MULTIPLE VITAMINS-MINERALS (CERTA-VITE PO)    Take 1 tablet by mouth daily.   PANTOPRAZOLE (PROTONIX) 40 MG TABLET    Take 1 tablet (40 mg total) by mouth 2 (two) times daily.   ZOLPIDEM (AMBIEN) 5 MG TABLET    Take 5 mg by mouth daily as needed for sleep.

## 2013-10-29 ENCOUNTER — Telehealth: Payer: Self-pay | Admitting: Family Medicine

## 2013-10-29 NOTE — Telephone Encounter (Signed)
Ok to refill one month supply of both.

## 2013-10-29 NOTE — Telephone Encounter (Signed)
Pt wants a refill of Ambien and Gabapintin.

## 2013-10-30 NOTE — Telephone Encounter (Signed)
Pt requesting medication refill. Pt has not had med filled since 03/2013. Sent back to Dr Deborra Medina for review, at the request of Dr Lorelei Pont

## 2013-10-30 NOTE — Telephone Encounter (Signed)
If there are concerns that would keep you from prescribing those meds, then our only choice is offering him an appointment to see if he needs/can be back on them.  He was very confused and made no sense on the phone yesterday.  He was talking in circles.  I cannot say it was alcohol that caused this but it was definitely difficult to understand him.  Dr. Deborra Medina, are you okay with my opinion of just telling him he needs an appt?

## 2013-10-30 NOTE — Telephone Encounter (Signed)
Dr. Lorelei Pont had concerns that pt was drinking again when he saw him this month, per staff. I agree that he should NOT be receiving Ambien or Gabapentin if he drinking heavily again.  This will likely cause more issues.  Will route to [facility] for her input.

## 2013-10-30 NOTE — Telephone Encounter (Signed)
Yes that is fine but he does need to be aware that we may not refill those medications.  I do not want him to be blind sided at the appointment.

## 2013-10-30 NOTE — Telephone Encounter (Signed)
Left a VM for patient that he needed to call and make an appointment to discuss with Dr. Deborra Medina the possibility of refilling his Ambien and Gabapentin.

## 2013-10-31 ENCOUNTER — Other Ambulatory Visit: Payer: Self-pay | Admitting: Family Medicine

## 2013-10-31 NOTE — Telephone Encounter (Signed)
Last filled 08/20/2013 #90 mail order--please advise if okay to refill

## 2013-11-06 ENCOUNTER — Other Ambulatory Visit: Payer: Self-pay | Admitting: *Deleted

## 2013-11-06 NOTE — Telephone Encounter (Signed)
Pt requesting medication refill of gabapentin 300mg  qid. Lm on pts vm and informed him, per prior telephone note, and OV is required for refills. Rx denied

## 2013-12-25 ENCOUNTER — Telehealth: Payer: Self-pay | Admitting: *Deleted

## 2013-12-25 MED ORDER — METHOCARBAMOL 500 MG PO TABS: 500.0000 mg | ORAL_TABLET | Freq: Every day | ORAL | Status: DC | PRN

## 2013-12-25 MED ORDER — CYCLOBENZAPRINE HCL 10 MG PO TABS: 10.0000 mg | ORAL_TABLET | Freq: Every day | ORAL | Status: DC | PRN

## 2013-12-25 MED ORDER — PROMETHAZINE HCL 25 MG PO TABS: ORAL_TABLET | ORAL | Status: DC

## 2013-12-25 MED ORDER — PANTOPRAZOLE SODIUM 40 MG PO TBEC: DELAYED_RELEASE_TABLET | ORAL | Status: DC

## 2014-02-08 NOTE — Telephone Encounter (Signed)
Rx previously sent to pharmacy 

## 2014-02-26 ENCOUNTER — Other Ambulatory Visit: Payer: Self-pay | Admitting: Family Medicine

## 2014-02-26 NOTE — Telephone Encounter (Signed)
rx last filled for #90 in 12/15, no future appt scheduled.

## 2014-02-26 NOTE — Telephone Encounter (Signed)
Needs OV prior to refill. Not sure why he needs this much phenergan.

## 2014-02-27 NOTE — Addendum Note (Signed)
Addended by: Marchia Bond on: 02/27/2014 01:08 PM   Modules accepted: Orders

## 2014-03-28 ENCOUNTER — Encounter: Payer: Self-pay | Admitting: Family Medicine

## 2014-03-28 ENCOUNTER — Ambulatory Visit (INDEPENDENT_AMBULATORY_CARE_PROVIDER_SITE_OTHER): Payer: Medicare Other | Admitting: Family Medicine

## 2014-03-28 VITALS — BP 120/90 | HR 115 | Temp 98.0°F | Ht 66.0 in | Wt 201.1 lb

## 2014-03-28 DIAGNOSIS — M25512 Pain in left shoulder: Secondary | ICD-10-CM | POA: Diagnosis not present

## 2014-03-28 DIAGNOSIS — J018 Other acute sinusitis: Secondary | ICD-10-CM | POA: Diagnosis not present

## 2014-03-28 DIAGNOSIS — S42202S Unspecified fracture of upper end of left humerus, sequela: Secondary | ICD-10-CM | POA: Diagnosis not present

## 2014-03-28 MED ORDER — PROMETHAZINE HCL 25 MG PO TABS: ORAL_TABLET | ORAL | Status: DC

## 2014-03-28 MED ORDER — AMOXICILLIN 500 MG PO CAPS: 1000.0000 mg | ORAL_CAPSULE | Freq: Two times a day (BID) | ORAL | Status: DC

## 2014-03-28 MED ORDER — PANTOPRAZOLE SODIUM 40 MG PO TBEC: DELAYED_RELEASE_TABLET | ORAL | Status: DC

## 2014-03-28 MED ORDER — METHOCARBAMOL 500 MG PO TABS: 500.0000 mg | ORAL_TABLET | Freq: Every day | ORAL | Status: DC | PRN

## 2014-03-28 MED ORDER — EPINEPHRINE 0.3 MG/0.3ML IJ SOAJ: INTRAMUSCULAR | Status: DC

## 2014-03-28 MED ORDER — CYCLOBENZAPRINE HCL 10 MG PO TABS: 10.0000 mg | ORAL_TABLET | Freq: Every day | ORAL | Status: DC | PRN

## 2014-03-28 NOTE — Progress Notes (Signed)
Dr. Frederico Hamman T. Tyrick Dunagan, MD, [facility] [place], [address] Phone: [phone] Fax: [phone]  03/28/2014  Patient: [name], MRN: [id number], DOB: 11/09/1945, 69 y.o.  Primary Physician:  [name], MD  Chief Complaint: Shoulder Pain and URI  Subjective:   [name] is a 69 y.o. very pleasant male patient who presents with the following:  Complex history is again noted below.  Complex trauma.  Hardware remains in place.  When I saw him in September of last year, recommended follow-up for consideration of removal of hardware.  I did an intra-articular injection for pain relief, and this did give him some significant relief for 4 or 5 months.  His motion is still limited in his strength is still not great.  The patient also has been sick for about 2 months, with intermittent chills, coughing quite a bit, runny nose and some facial pain.  He is not having any earache, no nausea, vomiting, or diarrhea.  In this time period, he has never fully recovered.  10/10/2013 Last OV with Owens Loffler, MD  Patient presents post complex L proximal humerus fracture including the tuberosities on 11/04/2011 with ORIF by Dr. Marcelino Scot with subequent complication, neck fracture, c1/2 fusion by Dr. Annette Stable for an odontoid fracture.  Consultation is requested by my partner for any kind of recommendation that I can make for the patient for function and pain management.   On reviewing the old films and new films, the patient has a least 2 screws that are approaching the glenohumeral joint space, and there in the glenohumeral joint space. There is also one that is not flush any longer.  The patient has significant pain and limitation in his motion in that LEFT shoulder.  He is limited somewhat in what he can take for paingiven that he is a chronic alcoholic with cirrhosis, and he also has had problems with ulcers and bleeding secondary to  NSAIDs. Currently he is using a lidocaine patch sometimes.  Past Medical History, Surgical History, Social History, Family History, Problem List, Medications, and Allergies have been reviewed and updated if relevant.  GEN: No fevers, chills. Nontoxic. Primarily MSK c/o today. MSK: Detailed in the HPI GI: tolerating PO intake without difficulty Neuro: No numbness, parasthesias, or tingling associated. Otherwise the pertinent positives of the ROS are noted above.   Objective:   BP 120/90 mmHg  Pulse 115  Temp(Src) 98 F (36.7 C) (Oral)  Ht 5\' 6"  (1.676 m)  Wt 201 lb 1.9 oz (91.227 kg)  BMI 32.48 kg/m2  SpO2 95%   GEN: WDWN, NAD, Non-toxic, Alert & Oriented x 3 HEENT: Atraumatic, Normocephalic.  Ears and Nose: No external deformity. EXTR: No clubbing/cyanosis/edema NEURO: Normal gait.  PSYCH: Normally interactive. Conversant. Not depressed or anxious appearing.  Calm demeanor.    Patient LEFT shoulder is nontender to palpation along the clavicle. Nontender at the a.c. Joint. He is mildly tender in the bicipital groove. There is a prominentscar.  Range of motion is notably limited. The patient can abduct his shoulder to 130actively. He can flex his shoulder to 125 actively. Internal and external rotation is fairly limited.  Strength testing is 4+ to 5/5 in all directions. No particular deficits.  Given his loss of motion, special testing not attempted.  Radiology: Dg Shoulder Left  10/10/2013   CLINICAL DATA:  Left shoulder pain.  EXAM: LEFT SHOULDER - 2+ VIEW  COMPARISON:  01/09/2013 .  FINDINGS: Postsurgical changes noted proximal left humerus, unchanged in appearance. Degenerative changes noted of the acromioclavicular and glenohumeral joints. No evidence of a fracture, dislocation, or separation P  IMPRESSION: 1. Stable postsurgical changes left humerus. 2. DJD, no acute abnormality .   Electronically Signed   By: Stow   On: 10/10/2013 10:14    Assessment and  Plan:   Left shoulder pain  Proximal humerus fracture, left, sequela  Other acute sinusitis  My recommendations to the patient today were basically exactly the same as they were last time.  I think that he needs to discuss his shoulder with Dr. Marcelino Scot.  It appears as if one of the surgical screws is not flush any longer with the plate, and it also looks as if there is some hardware in the intra-articular space.  Strongly recommended follow-up, and the patient seems willing to do so now.  Intervally, for pain control, he asked me to do an intra-articular injection.  I think this is reasonable given his level of dysfunction.  Intrarticular Shoulder Injection, LEFT Verbal consent was obtained from the patient. Risks including infection explained and contrasted with benefits and alternatives. Patient prepped with Chloraprep and Ethyl Chloride used for anesthesia. An intraarticular shoulder injection was performed using the posterior approach. The patient tolerated the procedure well and had decreased pain post injection. No complications. Injection: 8 cc of Lidocaine 1% and Depo-Medrol 80 mg. Needle: 22 gauge   10/10/2013 Last OV with Owens Loffler, MD  My recommendation to the patient was to have his surgical hardware removed so that he would have to have his screws removed from the glenohumeral joint. I suspect that this would make his shoulder feel better, but there is no guarantee. This conversation could be had with the operating surgeon.  Given this patient's history and extreme amount of alcohol intake, I would avoid any kind of anti-inflammatories, narcotics, and anything with Tylenol. Lidocaine patches are another reasonable idea.  Work on ROM daily.  New Prescriptions   AMOXICILLIN (AMOXIL) 500 MG CAPSULE    Take 2 capsules (1,000 mg total) by mouth 2 (two) times daily.   No orders of the defined types were placed in this encounter.    Signed,  Maud Deed. Brithany Whitworth,  MD   Patient's Medications  New Prescriptions   AMOXICILLIN (AMOXIL) 500 MG CAPSULE    Take 2 capsules (1,000 mg total) by mouth 2 (two) times daily.  Previous Medications   LIDOCAINE (LIDODERM) 5 %    Place 1 patch onto the skin daily. Remove & Discard patch within 12 hours or as directed by MD   MAGNESIUM 500 MG TABS    Take 500 mg by mouth daily.    QUETIAPINE (SEROQUEL) 300 MG TABLET    TAKE 1 TABLET AT BEDTIME   SPIRONOLACTONE (ALDACTONE) 50 MG TABLET    TAKE 1 TABLET DAILY   THIAMINE HCL (VITAMIN B-1) 100 MG TABLET    Take 100 mg by mouth daily.    TRAZODONE (DESYREL) 50 MG TABLET    TAKE 1 TABLET DAILY AS NEEDED  Modified Medications   Modified Medication Previous Medication   CYCLOBENZAPRINE (FLEXERIL) 10 MG TABLET cyclobenzaprine (FLEXERIL) 10 MG tablet      Take 1 tablet (10 mg total) by mouth daily as needed for muscle spasms.    Take 1 tablet (10 mg total) by mouth daily as needed for muscle spasms.   EPINEPHRINE (EPIPEN 2-PAK) 0.3 MG/0.3 ML IJ SOAJ INJECTION EPINEPHrine (EPIPEN 2-PAK)  0.3 mg/0.3 mL IJ SOAJ injection      INJECT IN TO THE MUSCLE AS DIRECTED FOR SEVERE ALLERGICE REACTION    INJECT IN TO THE MUSCLE AS DIRECTED FOR SEVERE ALLERGICE REACTION   METHOCARBAMOL (ROBAXIN) 500 MG TABLET methocarbamol (ROBAXIN) 500 MG tablet      Take 1 tablet (500 mg total) by mouth daily as needed.    Take 1 tablet (500 mg total) by mouth daily as needed.   PANTOPRAZOLE (PROTONIX) 40 MG TABLET pantoprazole (PROTONIX) 40 MG tablet      TAKE 1 TABLET (40 MG TOTAL) TWICE A DAY    TAKE 1 TABLET (40 MG TOTAL) TWICE A DAY   PROMETHAZINE (PHENERGAN) 25 MG TABLET promethazine (PHENERGAN) 25 MG tablet      TAKE 1 TABLET DAILY AS NEEDED FOR NAUSEA    TAKE 1 TABLET DAILY AS NEEDED FOR NAUSEA  Discontinued Medications   No medications on file

## 2014-03-28 NOTE — Progress Notes (Signed)
Pre visit review using our clinic review tool, if applicable. No additional management support is needed unless otherwise documented below in the visit note. 

## 2014-05-08 ENCOUNTER — Other Ambulatory Visit: Payer: Self-pay | Admitting: *Deleted

## 2014-05-08 MED ORDER — SPIRONOLACTONE 50 MG PO TABS: 50.0000 mg | ORAL_TABLET | Freq: Every day | ORAL | Status: DC

## 2014-05-08 MED ORDER — TRAZODONE HCL 50 MG PO TABS: 50.0000 mg | ORAL_TABLET | Freq: Every day | ORAL | Status: DC | PRN

## 2014-05-08 NOTE — Telephone Encounter (Signed)
Acute OV 03/28/14

## 2014-06-24 ENCOUNTER — Other Ambulatory Visit: Payer: Self-pay | Admitting: Family Medicine

## 2014-07-07 ENCOUNTER — Other Ambulatory Visit: Payer: Self-pay | Admitting: Family Medicine

## 2014-07-08 NOTE — Telephone Encounter (Signed)
pts last f/u appt 03/2013

## 2014-08-01 ENCOUNTER — Other Ambulatory Visit: Payer: Self-pay | Admitting: Family Medicine

## 2014-08-01 NOTE — Telephone Encounter (Signed)
Last f/u appt 03/2013

## 2014-08-01 NOTE — Telephone Encounter (Signed)
Ok to refill one time only.  Needs OV for further refills. 

## 2014-08-20 ENCOUNTER — Ambulatory Visit (INDEPENDENT_AMBULATORY_CARE_PROVIDER_SITE_OTHER): Payer: Medicare Other | Admitting: Family Medicine

## 2014-08-20 VITALS — BP 130/88 | HR 108 | Temp 98.1°F | Wt 204.5 lb

## 2014-08-20 DIAGNOSIS — L989 Disorder of the skin and subcutaneous tissue, unspecified: Secondary | ICD-10-CM

## 2014-08-20 MED ORDER — CAMPHOR-MENTHOL 0.5-0.5 % EX LOTN: TOPICAL_LOTION | CUTANEOUS | Status: DC

## 2014-08-20 NOTE — Assessment & Plan Note (Signed)
Ongoing for more than a year. Likely due to poor healing from excessive alcohol use/malnutrition but he has continued to refuse treatment for his alcoholism. He is asking for "a cream."  I advised against steroid creams as this could further thin his skin. eRx sent for sarna to help with the dry skin but not sure how much-if any- this will improve the discoloration he is concerned with. The patient indicates understanding of these issues and agrees with the plan.

## 2014-08-20 NOTE — Progress Notes (Signed)
Pre visit review using our clinic review tool, if applicable. No additional management support is needed unless otherwise documented below in the visit note. 

## 2014-08-20 NOTE — Progress Notes (Signed)
Subjective:   Patient ID: [name], male    DOB: 11-15-1945, 69 y.o.   MRN: [id number]  [name] is a pleasant 69 y.o. year old male who presents to clinic today with Bleeding/Bruising  on 08/20/2014  HPI:  Chronic history of ETOH abuse and h/o left shoulder and neck surgery.  For over a year, noticed easy bruising and thinning of his skin on left arm.  No blood in stool or urine.  He is still drinking quite excessively.   No further known injuries.  Does get steroid injections in his left shoulder.  Last one was 6 months ago.  Current Outpatient Prescriptions on File Prior to Visit  Medication Sig Dispense Refill  . cyclobenzaprine (FLEXERIL) 10 MG tablet Take 1 tablet (10 mg total) by mouth daily as needed for muscle spasms. 90 tablet 1  . EPINEPHrine (EPIPEN 2-PAK) 0.3 mg/0.3 mL IJ SOAJ injection INJECT IN TO THE MUSCLE AS DIRECTED FOR SEVERE ALLERGICE REACTION 2 Device 1  . Magnesium 500 MG TABS Take 500 mg by mouth daily.     . methocarbamol (ROBAXIN) 500 MG tablet Take 1 tablet (500 mg total) by mouth daily as needed. 90 tablet 1  . pantoprazole (PROTONIX) 40 MG tablet TAKE 1 TABLET (40 MG TOTAL) TWICE A DAY 180 tablet 3  . promethazine (PHENERGAN) 25 MG tablet TAKE 1 TABLET DAILY AS NEEDED FOR NAUSEA 90 tablet 3  . QUEtiapine (SEROQUEL) 300 MG tablet TAKE 1 TABLET AT BEDTIME 90 tablet 2  . spironolactone (ALDACTONE) 50 MG tablet TAKE 1 TABLET DAILY 90 tablet 1  . Thiamine HCl (VITAMIN B-1) 100 MG tablet Take 100 mg by mouth daily.     . traZODone (DESYREL) 50 MG tablet TAKE 1 TABLET (50 MG TOTAL) BY MOUTH DAILY AS NEEDED. 30 tablet 0   No current facility-administered medications on file prior to visit.    Allergies  Allergen Reactions  . Bee Venom Swelling  . Nifedipine     REACTION: SWELLING 11/02/2011 pt denies this allergy.    Past Medical History  Diagnosis Date  . HLD (hyperlipidemia) 5/99  . HTN (hypertension) 5/99  . BPH (benign prostatic  hypertrophy) 5/99  . GERD (gastroesophageal reflux disease) 07/31/04    gastritis   . Anxiety   . Allergy     pollen/ragweed  . Adenomatous polyp of colon 2006  . Hearing difficulty   . Humerus fracture 11/02/2011    LUE  . Peripheral vascular disease   . Pneumonia 1996    hosp  . Chronic bronchitis     "q year or q other year" (11/02/2011)  . History of blood transfusion 2012  . Anemia 2012  . Hernia, umbilical     "unrepaired" (11/02/2011)  . H/O hiatal hernia   . Memory loss     "recently has been forgetting alot; maybe related to the alcohol" (11/02/2011)  . Falls frequently     "daily lately; legs just give out" (11/02/2011)  . H/O nausea   . Depression     Hx of  . Shortness of breath     with exertion  . Seasonal allergies     Past Surgical History  Procedure Laterality Date  . L duputryen contractive surgery    . Neck mri  6/04    C5-6 disc abnormality  . Colonoscopy  07/31/04    multiple polyps; repeat in 3 years  . Hosp cp r/o'd 2/24  03/08/07  . Ett myoview  03/09/07    nml EF 66%  . Upper gastrointestinal endoscopy    . Cataract extraction w/ intraocular lens  implant, bilateral  10/2002  . Tonsillectomy      "I was young" (11/02/2011)  . Hemorrhoid surgery      "cauterized a long time ago" (11/02/2011)  . Esophageal varice ligation  2012  . Orif humerus fracture  11/04/2011    Procedure: OPEN REDUCTION INTERNAL FIXATION (ORIF) PROXIMAL HUMERUS FRACTURE;  Surgeon: Rozanna Box, MD;  Location: [facility];  Service: Orthopedics;  Laterality: Left;  . Humerus fracture surgery Left 11/04/11    with plate to the shoulder  . Posterior cervical fusion/foraminotomy N/A 03/21/2012    Procedure: POSTERIOR CERVICAL FUSION/FORAMINOTOMY LEVEL ONE;  Surgeon: Charlie Pitter, MD;  Location: [facility];  Service: Neurosurgery;  Laterality: N/A;  POSTERIOR CERVICAL ONE TO CERVICAL TWO FUSION WITH ILIAC CREST GRAFT AND LATERAL MASS SCREWS     Family History  Problem  Relation Age of Onset  . Heart disease Father   . Diabetes Father   . Stroke Father   . Depression Mother   . Heart disease Brother   . Alcohol abuse Brother   . Liver disease Brother     History   Social History  . Marital Status: Married    Spouse Name: N/A  . Number of Children: 3  . Years of Education: N/A   Occupational History  . color matcher    Social History Main Topics  . Smoking status: Never Smoker   . Smokeless tobacco: Never Used  . Alcohol Use: 540.0 oz/week    900 Glasses of wine per week     Comment: 11/02/2011 "large box of wine q 2 days"  . Drug Use: No  . Sexual Activity: No   Other Topics Concern  . Not on file   Social History Narrative   Married (remarried), lives with wife; 3 children, 1 at home.    [employer] - mixes colors       Pt revised designated party release form Dmarion Perfect, wife [phone] - can leave msg.     Allen Norris 03/04/10.     The PMH, PSH, Social History, Family History, Medications, and allergies have been reviewed in [facility], and have been updated if relevant.   Review of Systems  Constitutional: Negative.   Respiratory: Negative.   Cardiovascular: Negative.   Musculoskeletal: Negative.   Skin: Positive for color change. Negative for rash and wound.  Hematological: Bruises/bleeds easily.  Psychiatric/Behavioral: Negative.   All other systems reviewed and are negative.      Objective:    BP 130/88 mmHg  Pulse 108  Temp(Src) 98.1 F (36.7 C) (Oral)  Wt 204 lb 8 oz (92.761 kg)  SpO2 98%   Physical Exam  Constitutional: He is oriented to person, place, and time. He appears well-developed and well-nourished. No distress.  HENT:  Head: Normocephalic.  Cardiovascular: Normal rate.   Pulmonary/Chest: Effort normal.  Musculoskeletal: He exhibits no edema.  Neurological: He is alert and oriented to person, place, and time. No cranial nerve deficit.  Skin:     Psychiatric: He has a normal mood and  affect. His behavior is normal. Thought content normal.  Nursing note and vitals reviewed.         Assessment & Plan:   Skin abnormality No Follow-up on file.

## 2014-09-02 ENCOUNTER — Encounter: Payer: Self-pay | Admitting: Gastroenterology

## 2014-09-17 ENCOUNTER — Other Ambulatory Visit: Payer: Self-pay | Admitting: Family Medicine

## 2014-10-21 ENCOUNTER — Other Ambulatory Visit: Payer: Self-pay | Admitting: *Deleted

## 2014-10-21 MED ORDER — SPIRONOLACTONE 50 MG PO TABS: 50.0000 mg | ORAL_TABLET | Freq: Every day | ORAL | Status: DC

## 2014-10-21 MED ORDER — PROMETHAZINE HCL 25 MG PO TABS: ORAL_TABLET | ORAL | Status: DC

## 2014-10-21 MED ORDER — TRAZODONE HCL 50 MG PO TABS: 50.0000 mg | ORAL_TABLET | Freq: Every day | ORAL | Status: DC | PRN

## 2014-10-21 MED ORDER — ZOLPIDEM TARTRATE 5 MG PO TABS: 5.0000 mg | ORAL_TABLET | Freq: Every evening | ORAL | Status: DC | PRN

## 2014-10-21 NOTE — Telephone Encounter (Signed)
Yes ok to refill since appt scheduled

## 2014-10-21 NOTE — Telephone Encounter (Signed)
Patient calling for refill.  Called patient to notify he is past due for CPE and follow up.  Appointment scheduled for medicare wellness 10/13.  Okay to refill?

## 2014-10-24 ENCOUNTER — Encounter: Payer: Self-pay | Admitting: Family Medicine

## 2014-10-24 ENCOUNTER — Ambulatory Visit (INDEPENDENT_AMBULATORY_CARE_PROVIDER_SITE_OTHER): Payer: Medicare Other | Admitting: Family Medicine

## 2014-10-24 VITALS — BP 124/78 | HR 106 | Temp 98.0°F | Wt 202.2 lb

## 2014-10-24 DIAGNOSIS — Z125 Encounter for screening for malignant neoplasm of prostate: Secondary | ICD-10-CM | POA: Diagnosis not present

## 2014-10-24 DIAGNOSIS — F1010 Alcohol abuse, uncomplicated: Secondary | ICD-10-CM

## 2014-10-24 DIAGNOSIS — E785 Hyperlipidemia, unspecified: Secondary | ICD-10-CM

## 2014-10-24 DIAGNOSIS — K7030 Alcoholic cirrhosis of liver without ascites: Secondary | ICD-10-CM | POA: Diagnosis not present

## 2014-10-24 DIAGNOSIS — Z23 Encounter for immunization: Secondary | ICD-10-CM | POA: Diagnosis not present

## 2014-10-24 DIAGNOSIS — Q181 Preauricular sinus and cyst: Secondary | ICD-10-CM

## 2014-10-24 DIAGNOSIS — F329 Major depressive disorder, single episode, unspecified: Secondary | ICD-10-CM

## 2014-10-24 DIAGNOSIS — L729 Follicular cyst of the skin and subcutaneous tissue, unspecified: Secondary | ICD-10-CM | POA: Diagnosis not present

## 2014-10-24 DIAGNOSIS — D539 Nutritional anemia, unspecified: Secondary | ICD-10-CM | POA: Diagnosis not present

## 2014-10-24 DIAGNOSIS — I10 Essential (primary) hypertension: Secondary | ICD-10-CM

## 2014-10-24 DIAGNOSIS — Z1211 Encounter for screening for malignant neoplasm of colon: Secondary | ICD-10-CM

## 2014-10-24 DIAGNOSIS — F32A Depression, unspecified: Secondary | ICD-10-CM

## 2014-10-24 DIAGNOSIS — Z0000 Encounter for general adult medical examination without abnormal findings: Secondary | ICD-10-CM | POA: Diagnosis not present

## 2014-10-24 LAB — LIPID PANEL
CHOL/HDL RATIO: 3
CHOLESTEROL: 150 mg/dL (ref 0–200)
HDL: 49.8 mg/dL (ref >39.00)
LDL CALC: 67 mg/dL (ref 0–99)
NonHDL: 100.48
TRIGLYCERIDES: 166 mg/dL — AB (ref 0.0–149.0)
VLDL: 33.2 mg/dL (ref 0.0–40.0)

## 2014-10-24 LAB — CBC WITH DIFFERENTIAL/PLATELET
BASOS PCT: 0.4 % (ref 0.0–3.0)
Basophils Absolute: 0 10*3/uL (ref 0.0–0.1)
EOS ABS: 0.1 10*3/uL (ref 0.0–0.7)
EOS PCT: 0.9 % (ref 0.0–5.0)
HEMATOCRIT: 38.6 % — AB (ref 39.0–52.0)
Hemoglobin: 13.3 g/dL (ref 13.0–17.0)
Lymphocytes Relative: 14.3 % (ref 12.0–46.0)
Lymphs Abs: 1.4 10*3/uL (ref 0.7–4.0)
MCHC: 34.6 g/dL (ref 30.0–36.0)
MCV: 94.9 fl (ref 78.0–100.0)
MONO ABS: 1.1 10*3/uL — AB (ref 0.1–1.0)
Monocytes Relative: 11.8 % (ref 3.0–12.0)
NEUTROS ABS: 7 10*3/uL (ref 1.4–7.7)
Neutrophils Relative %: 72.6 % (ref 43.0–77.0)
PLATELETS: 226 10*3/uL (ref 150.0–400.0)
RBC: 4.06 Mil/uL — ABNORMAL LOW (ref 4.22–5.81)
RDW: 14.9 % (ref 11.5–15.5)
WBC: 9.7 10*3/uL (ref 4.0–10.5)

## 2014-10-24 LAB — COMPREHENSIVE METABOLIC PANEL
ALBUMIN: 3.3 g/dL — AB (ref 3.5–5.2)
ALT: 16 U/L (ref 0–53)
AST: 28 U/L (ref 0–37)
Alkaline Phosphatase: 136 U/L — ABNORMAL HIGH (ref 39–117)
BUN: 5 mg/dL — ABNORMAL LOW (ref 6–23)
CALCIUM: 9.1 mg/dL (ref 8.4–10.5)
CHLORIDE: 96 meq/L (ref 96–112)
CO2: 28 meq/L (ref 19–32)
CREATININE: 0.88 mg/dL (ref 0.40–1.50)
GFR: 91.48 mL/min (ref >60.00)
Glucose, Bld: 119 mg/dL — ABNORMAL HIGH (ref 70–99)
POTASSIUM: 4.6 meq/L (ref 3.5–5.1)
SODIUM: 134 meq/L — AB (ref 135–145)
Total Bilirubin: 0.8 mg/dL (ref 0.2–1.2)
Total Protein: 6.2 g/dL (ref 6.0–8.3)

## 2014-10-24 LAB — PSA: PSA: 1.25 ng/mL (ref 0.10–4.00)

## 2014-10-24 MED ORDER — CYCLOBENZAPRINE HCL 10 MG PO TABS: 10.0000 mg | ORAL_TABLET | Freq: Every day | ORAL | Status: DC | PRN

## 2014-10-24 MED ORDER — PROMETHAZINE HCL 25 MG PO TABS: ORAL_TABLET | ORAL | Status: DC

## 2014-10-24 NOTE — Patient Instructions (Signed)
Great to see you.  We will call you with your lab results. 

## 2014-10-24 NOTE — Assessment & Plan Note (Signed)
Overdue for labs- check today.

## 2014-10-24 NOTE — Assessment & Plan Note (Signed)
Refer to dermatology for removal.  

## 2014-10-24 NOTE — Assessment & Plan Note (Signed)
Normotensive. No changes made today. 

## 2014-10-24 NOTE — Assessment & Plan Note (Addendum)
The patients weight, height, BMI and visual acuity have been recorded in the chart I have made referrals, counseling and provided education to the patient based review of the above and I have provided the pt with a written personalized care plan for preventive services.  Influenza vaccine given today.  Labs  GI referral placed for screening colonscopy  Orders Placed This Encounter  Procedures  . Flu Vaccine QUAD 36+ mos PF IM (Fluarix & Fluzone Quad PF)  . CBC with Differential/Platelet  . Comprehensive metabolic panel  . Lipid panel  . PSA

## 2014-10-24 NOTE — Assessment & Plan Note (Signed)
Symptoms controlled on current rxs. No changes made today. 

## 2014-10-24 NOTE — Progress Notes (Signed)
Subjective:   Patient ID: [name], male    DOB: 23-Mar-1945, 69 y.o.   MRN: [id number]  [name] is a pleasant 69 y.o. year old male who presents to clinic today with Annual Exam  and follow up of chronic medical conditions on 10/24/2014  HPI:  I have personally reviewed the Medicare Annual Wellness questionnaire and have noted 1. The patient's medical and social history 2. Their use of alcohol, tobacco or illicit drugs 3. Their current medications and supplements 4. The patient's functional ability including ADL's, fall risks, home safety risks and hearing or visual             impairment. 5. Diet and physical activities 6. Evidence for depression or mood disorders  End of life wishes discussed and updated in Social History.  The roster of all physicians providing medical care to patient - is listed in the CareTeams section of the chart.  Zoster 05/12/12 Tdap 07/17/12 pneumovax 11/28/99 Prevnar 13 10/04/13 Colonoscopy 08/01/14- Sharlett Iles  Has not been seen for routine preventative care in years.  Lesion over right ear has been bothering him when he sleeps on his right side.  Not growing but has been there for at least 6 months. Never red, warm or draining.  Last 2 years complicated by injuries sustained during falls while intoxicated. Complex trauma with surgical and post surgical complications.  complex L proximal humerus fracture including the tuberosities on 11/04/2011 with ORIF by Dr. Marcelino Scot with subequent complication, neck fracture, c1/2 fusion by Dr. Annette Stable for an odontoid fracture. Still taking robaxin quite regularly for persistent spasms.  Depression and insomnia- has been taking Trazodone 50 mg and seroquel 300 mg daily with occasional ambien.  History of alcoholic cirrhosis and difficulty with electrolyte imbalances in past. Overdue for labs today. Unfortunately still drinking.   Only 1 fall this year- no injury.   Lab Results  Component Value Date   WBC 8.1 08/14/2012   HGB 15.1 08/14/2012   HCT 44.9 08/14/2012   MCV 90.2 08/14/2012   PLT 194.0 08/14/2012   Lab Results  Component Value Date   TSH 0.97 02/29/2012   Lab Results  Component Value Date   NA 135 08/14/2012   K 4.3 08/14/2012   CL 97 08/14/2012   CO2 28 08/14/2012   Hepatic Function Latest Ref Rng 08/14/2012 02/29/2012 11/17/2011  Total Protein 6.0 - 8.3 g/dL 8.1 7.5 6.3  Albumin 3.5 - 5.2 g/dL 4.6 4.2 2.4(L)  AST 0 - 37 U/L 29 25 70(H)  ALT 0 - 53 U/L 18 16 33  Alk Phosphatase 39 - 117 U/L 96 53 265(H)  Total Bilirubin 0.3 - 1.2 mg/dL 1.1 0.7 3.2(H)  Bilirubin, Direct 0.0 - 0.3 mg/dL - - -    Lab Results  Component Value Date   CHOL 117 09/16/2010   HDL 65 09/16/2010   LDLCALC 42 09/16/2010   TRIG 51 09/16/2010   CHOLHDL 1.8 09/16/2010    Current Outpatient Prescriptions on File Prior to Visit  Medication Sig Dispense Refill  . camphor-menthol (SARNA) lotion Apply topically as needed 222 mL 0  . EPINEPHrine (EPIPEN 2-PAK) 0.3 mg/0.3 mL IJ SOAJ injection INJECT IN TO THE MUSCLE AS DIRECTED FOR SEVERE ALLERGICE REACTION 2 Device 1  . Magnesium 500 MG TABS Take 500 mg by mouth daily.     . methocarbamol (ROBAXIN) 500 MG tablet Take 1 tablet (500 mg total) by mouth daily as needed. 90 tablet 1  . QUEtiapine (SEROQUEL)  300 MG tablet TAKE 1 TABLET AT BEDTIME 90 tablet 2  . Thiamine HCl (VITAMIN B-1) 100 MG tablet Take 100 mg by mouth daily.      No current facility-administered medications on file prior to visit.    Allergies  Allergen Reactions  . Bee Venom Swelling  . Nifedipine     REACTION: SWELLING 11/02/2011 pt denies this allergy.    Past Medical History  Diagnosis Date  . HLD (hyperlipidemia) 5/99  . HTN (hypertension) 5/99  . BPH (benign prostatic hypertrophy) 5/99  . GERD (gastroesophageal reflux disease) 07/31/04    gastritis   . Anxiety   . Allergy     pollen/ragweed  . Adenomatous polyp of colon 2006  . Hearing difficulty   .  Humerus fracture 11/02/2011    LUE  . Peripheral vascular disease ([place])   . Pneumonia 1996    hosp  . Chronic bronchitis ([place])     "q year or q other year" (11/02/2011)  . History of blood transfusion 2012  . Anemia 2012  . Hernia, umbilical     "unrepaired" (11/02/2011)  . H/O hiatal hernia   . Memory loss     "recently has been forgetting alot; maybe related to the alcohol" (11/02/2011)  . Falls frequently     "daily lately; legs just give out" (11/02/2011)  . H/O nausea   . Depression     Hx of  . Shortness of breath     with exertion  . Seasonal allergies     Past Surgical History  Procedure Laterality Date  . L duputryen contractive surgery    . Neck mri  6/04    C5-6 disc abnormality  . Colonoscopy  07/31/04    multiple polyps; repeat in 3 years  . Hosp cp r/o'd 2/24  03/08/07  . Ett myoview  03/09/07    nml EF 66%  . Upper gastrointestinal endoscopy    . Cataract extraction w/ intraocular lens  implant, bilateral  10/2002  . Tonsillectomy      "I was young" (11/02/2011)  . Hemorrhoid surgery      "cauterized a long time ago" (11/02/2011)  . Esophageal varice ligation  2012  . Orif humerus fracture  11/04/2011    Procedure: OPEN REDUCTION INTERNAL FIXATION (ORIF) PROXIMAL HUMERUS FRACTURE;  Surgeon: Rozanna Box, MD;  Location: [facility];  Service: Orthopedics;  Laterality: Left;  . Humerus fracture surgery Left 11/04/11    with plate to the shoulder  . Posterior cervical fusion/foraminotomy N/A 03/21/2012    Procedure: POSTERIOR CERVICAL FUSION/FORAMINOTOMY LEVEL ONE;  Surgeon: Charlie Pitter, MD;  Location: [facility];  Service: Neurosurgery;  Laterality: N/A;  POSTERIOR CERVICAL ONE TO CERVICAL TWO FUSION WITH ILIAC CREST GRAFT AND LATERAL MASS SCREWS     Family History  Problem Relation Age of Onset  . Heart disease Father   . Diabetes Father   . Stroke Father   . Depression Mother   . Heart disease Brother   . Alcohol abuse Brother   . Liver disease  Brother     Social History   Social History  . Marital Status: Married    Spouse Name: N/A  . Number of Children: 3  . Years of Education: N/A   Occupational History  . color matcher    Social History Main Topics  . Smoking status: Never Smoker   . Smokeless tobacco: Never Used  . Alcohol Use: 540.0 oz/week    900 Glasses of  wine per week     Comment: 11/02/2011 "large box of wine q 2 days"  . Drug Use: No  . Sexual Activity: No   Other Topics Concern  . Not on file   Social History Narrative   Married (remarried), lives with wife; 3 children, 1 at home.    [employer] - mixes colors       Pt revised designated party release form Haji Delaine, wife [phone] - can leave msg.     Allen Norris 03/04/10.        Would desire CPR.  Would not want prolonged life support if futile.   The PMH, PSH, Social History, Family History, Medications, and allergies have been reviewed in [facility], and have been updated if relevant.   Review of Systems  Constitutional: Negative.   HENT: Negative.   Eyes: Negative.   Respiratory: Negative.   Cardiovascular: Negative.   Gastrointestinal: Negative.   Endocrine: Negative.   Genitourinary: Negative.   Musculoskeletal: Negative.   Skin: Negative.   Allergic/Immunologic: Negative.   Neurological: Negative.   Hematological: Negative.   Psychiatric/Behavioral: Negative.   All other systems reviewed and are negative.      Objective:    BP 124/78 mmHg  Pulse 106  Temp(Src) 98 F (36.7 C) (Oral)  Wt 202 lb 4 oz (91.74 kg)  SpO2 99%  Wt Readings from Last 3 Encounters:  10/24/14 202 lb 4 oz (91.74 kg)  08/20/14 204 lb 8 oz (92.761 kg)  03/28/14 201 lb 1.9 oz (91.227 kg)    Physical Exam  General:  overweght male in NAD Eyes:  PERRL Ears:  External ear exam shows no significant lesions or deformities.  Otoscopic examination reveals clear canals, tympanic membranes are intact bilaterally without bulging, retraction,  inflammation or discharge. Hearing is grossly normal bilaterally. Nose:  External nasal examination shows no deformity or inflammation. Nasal mucosa are pink and moist without lesions or exudates. Mouth:  Oral mucosa and oropharynx without lesions or exudates.  Teeth in good repair. Neck:  no carotid bruit or thyromegaly no cervical or supraclavicular lymphadenopathy  Lungs:  Normal respiratory effort, chest expands symmetrically. Lungs are clear to auscultation, no crackles or wheezes. Heart:  Normal rate and regular rhythm. S1 and S2 normal without gallop, murmur, click, rub or other extra sounds. Abdomen:  Bowel sounds positive,abdomen soft and non-tender without masses, organomegaly or hernias noted. Genitalia:  Testes bilaterally descended without nodularity, tenderness or masses. No scrotal masses or lesions. No penis lesions or urethral discharge. Prostate:  Prostate gland firm and smooth, 1 plus enlargement, no nodularity, tenderness, mass, asymmetry or induration. Pulses:  R and L posterior tibial pulses are full and equal bilaterally  Extremities:  no edema  Skin:  Freely movable lesion left ear consistent with cyst       Assessment & Plan:   Medicare annual wellness visit, subsequent - Plan: CBC with Differential/Platelet, Comprehensive metabolic panel, Lipid panel, PSA  Deficiency anemia  Alcoholic cirrhosis of liver without ascites (HCC)  Essential hypertension  Alcohol abuse  Depression  HLD (hyperlipidemia)  Need for influenza vaccination - Plan: Flu Vaccine QUAD 36+ mos PF IM (Fluarix & Fluzone Quad PF) No Follow-up on file.

## 2014-10-24 NOTE — Assessment & Plan Note (Signed)
Unfortunately still drinking and continues to refuse treatment. Check liver function today.

## 2014-10-25 ENCOUNTER — Encounter: Payer: Self-pay | Admitting: Family Medicine

## 2014-10-31 ENCOUNTER — Telehealth: Payer: Self-pay | Admitting: *Deleted

## 2014-10-31 NOTE — Telephone Encounter (Signed)
Lm on pts vm requesting a call back 

## 2014-10-31 NOTE — Telephone Encounter (Signed)
His labs are stable, including cholesterol, blood sugar, liver or kidney function.

## 2014-10-31 NOTE — Telephone Encounter (Signed)
Pt left voicemail at Triage, pt had labs on 10/13 and hasn't received any call or letter letting him know what his labs are and he is requesting a call back with Dr. Hulen Shouts comments

## 2014-11-01 DIAGNOSIS — H61009 Unspecified perichondritis of external ear, unspecified ear: Secondary | ICD-10-CM | POA: Diagnosis not present

## 2014-11-01 DIAGNOSIS — D225 Melanocytic nevi of trunk: Secondary | ICD-10-CM | POA: Diagnosis not present

## 2014-11-01 DIAGNOSIS — H61001 Unspecified perichondritis of right external ear: Secondary | ICD-10-CM | POA: Diagnosis not present

## 2014-11-01 DIAGNOSIS — D2221 Melanocytic nevi of right ear and external auricular canal: Secondary | ICD-10-CM | POA: Diagnosis not present

## 2014-11-01 DIAGNOSIS — T1490 Injury, unspecified: Secondary | ICD-10-CM | POA: Diagnosis not present

## 2014-11-04 NOTE — Telephone Encounter (Signed)
Spoke to pt and informed him of results 

## 2014-11-05 ENCOUNTER — Telehealth: Payer: Self-pay | Admitting: *Deleted

## 2014-11-05 ENCOUNTER — Other Ambulatory Visit: Payer: Self-pay | Admitting: Internal Medicine

## 2014-11-05 MED ORDER — ZOLPIDEM TARTRATE 5 MG PO TABS: 5.0000 mg | ORAL_TABLET | Freq: Every evening | ORAL | Status: DC | PRN

## 2014-11-05 MED ORDER — SPIRONOLACTONE 50 MG PO TABS: 50.0000 mg | ORAL_TABLET | Freq: Every day | ORAL | Status: DC

## 2014-11-05 MED ORDER — TRAZODONE HCL 50 MG PO TABS: 50.0000 mg | ORAL_TABLET | Freq: Every day | ORAL | Status: DC

## 2014-11-05 MED ORDER — PROMETHAZINE HCL 25 MG PO TABS: ORAL_TABLET | ORAL | Status: DC

## 2014-11-05 NOTE — Telephone Encounter (Signed)
Patient requested refills on 10/10.  These were sent to [facility] in error (should have gone to [facility]).  This was called and canceled and sent to correct pharmacy.  7 day supply was called in to [facility] to last patient until mail order has been received.

## 2014-11-07 ENCOUNTER — Encounter: Payer: Self-pay | Admitting: Family Medicine

## 2014-11-07 ENCOUNTER — Ambulatory Visit (INDEPENDENT_AMBULATORY_CARE_PROVIDER_SITE_OTHER): Payer: Medicare Other | Admitting: Family Medicine

## 2014-11-07 VITALS — BP 118/64 | HR 113 | Temp 97.9°F | Wt 205.5 lb

## 2014-11-07 DIAGNOSIS — S51812A Laceration without foreign body of left forearm, initial encounter: Secondary | ICD-10-CM | POA: Insufficient documentation

## 2014-11-07 DIAGNOSIS — S51802A Unspecified open wound of left forearm, initial encounter: Secondary | ICD-10-CM | POA: Diagnosis not present

## 2014-11-07 NOTE — Progress Notes (Addendum)
Subjective:   Patient ID: [name], male    DOB: Dec 29, 1945, 69 y.o.   MRN: [id number]  [name] is a pleasant 69 y.o. year old male who presents to clinic today with Arm Pain  on 11/07/2014  HPI:  Left arm bleeding- bumped it again something and now keeps bleeding. Not painful.  He has been putting polysporin on it.  No redness, warmth or drainage.  Current Outpatient Prescriptions on File Prior to Visit  Medication Sig Dispense Refill  . camphor-menthol (SARNA) lotion Apply topically as needed 222 mL 0  . cyclobenzaprine (FLEXERIL) 10 MG tablet Take 1 tablet (10 mg total) by mouth daily as needed for muscle spasms. 90 tablet 1  . EPINEPHrine (EPIPEN 2-PAK) 0.3 mg/0.3 mL IJ SOAJ injection INJECT IN TO THE MUSCLE AS DIRECTED FOR SEVERE ALLERGICE REACTION 2 Device 1  . Magnesium 500 MG TABS Take 500 mg by mouth daily.     . pantoprazole (PROTONIX) 40 MG tablet Take 40 mg by mouth 2 (two) times daily.    . promethazine (PHENERGAN) 25 MG tablet TAKE 1 TABLET DAILY AS NEEDED FOR NAUSEA 90 tablet 0  . QUEtiapine (SEROQUEL) 300 MG tablet TAKE 1 TABLET AT BEDTIME 90 tablet 2  . spironolactone (ALDACTONE) 50 MG tablet Take 1 tablet (50 mg total) by mouth daily. 90 tablet 3  . Thiamine HCl (VITAMIN B-1) 100 MG tablet Take 100 mg by mouth daily.     . traZODone (DESYREL) 50 MG tablet Take 1 tablet (50 mg total) by mouth at bedtime. 90 tablet 0  . zolpidem (AMBIEN) 5 MG tablet Take 1 tablet (5 mg total) by mouth at bedtime as needed for sleep. 90 tablet 0   No current facility-administered medications on file prior to visit.    Allergies  Allergen Reactions  . Bee Venom Swelling  . Nifedipine     REACTION: SWELLING 11/02/2011 pt denies this allergy.    Past Medical History  Diagnosis Date  . HLD (hyperlipidemia) 5/99  . HTN (hypertension) 5/99  . BPH (benign prostatic hypertrophy) 5/99  . GERD (gastroesophageal reflux disease) 07/31/04    gastritis   . Anxiety   . Allergy      pollen/ragweed  . Adenomatous polyp of colon 2006  . Hearing difficulty   . Humerus fracture 11/02/2011    LUE  . Peripheral vascular disease ([place])   . Pneumonia 1996    hosp  . Chronic bronchitis ([place])     "q year or q other year" (11/02/2011)  . History of blood transfusion 2012  . Anemia 2012  . Hernia, umbilical     "unrepaired" (11/02/2011)  . H/O hiatal hernia   . Memory loss     "recently has been forgetting alot; maybe related to the alcohol" (11/02/2011)  . Falls frequently     "daily lately; legs just give out" (11/02/2011)  . H/O nausea   . Depression     Hx of  . Shortness of breath     with exertion  . Seasonal allergies     Past Surgical History  Procedure Laterality Date  . L duputryen contractive surgery    . Neck mri  6/04    C5-6 disc abnormality  . Colonoscopy  07/31/04    multiple polyps; repeat in 3 years  . Hosp cp r/o'd 2/24  03/08/07  . Ett myoview  03/09/07    nml EF 66%  . Upper gastrointestinal endoscopy    .  Cataract extraction w/ intraocular lens  implant, bilateral  10/2002  . Tonsillectomy      "I was young" (11/02/2011)  . Hemorrhoid surgery      "cauterized a long time ago" (11/02/2011)  . Esophageal varice ligation  2012  . Orif humerus fracture  11/04/2011    Procedure: OPEN REDUCTION INTERNAL FIXATION (ORIF) PROXIMAL HUMERUS FRACTURE;  Surgeon: Rozanna Box, MD;  Location: [facility];  Service: Orthopedics;  Laterality: Left;  . Humerus fracture surgery Left 11/04/11    with plate to the shoulder  . Posterior cervical fusion/foraminotomy N/A 03/21/2012    Procedure: POSTERIOR CERVICAL FUSION/FORAMINOTOMY LEVEL ONE;  Surgeon: Charlie Pitter, MD;  Location: [facility];  Service: Neurosurgery;  Laterality: N/A;  POSTERIOR CERVICAL ONE TO CERVICAL TWO FUSION WITH ILIAC CREST GRAFT AND LATERAL MASS SCREWS     Family History  Problem Relation Age of Onset  . Heart disease Father   . Diabetes Father   . Stroke Father   . Depression  Mother   . Heart disease Brother   . Alcohol abuse Brother   . Liver disease Brother     Social History   Social History  . Marital Status: Married    Spouse Name: N/A  . Number of Children: 3  . Years of Education: N/A   Occupational History  . color matcher    Social History Main Topics  . Smoking status: Never Smoker   . Smokeless tobacco: Never Used  . Alcohol Use: 540.0 oz/week    900 Glasses of wine per week     Comment: 11/02/2011 "large box of wine q 2 days"  . Drug Use: No  . Sexual Activity: No   Other Topics Concern  . Not on file   Social History Narrative   Married (remarried), lives with wife; 3 children, 1 at home.    [employer] - mixes colors       Pt revised designated party release form [name], wife [phone] - can leave msg.     Allen Norris 03/04/10.        Would desire CPR.  Would not want prolonged life support if futile.   The PMH, PSH, Social History, Family History, Medications, and allergies have been reviewed in [facility], and have been updated if relevant.   Review of Systems  Constitutional: Negative.   Gastrointestinal: Negative.   Skin: Positive for wound. Negative for rash.  All other systems reviewed and are negative.      Objective:    BP 118/64 mmHg  Pulse 113  Temp(Src) 97.9 F (36.6 C) (Oral)  Wt 205 lb 8 oz (93.214 kg)  SpO2 99%   Physical Exam  Constitutional: He is oriented to person, place, and time. He appears well-developed and well-nourished. No distress.  HENT:  Head: Normocephalic.  Eyes: Conjunctivae are normal.  Neck: Normal range of motion.  Cardiovascular: Normal rate.   Pulmonary/Chest: Effort normal.  Musculoskeletal: Normal range of motion.  Neurological: He is alert and oriented to person, place, and time. No cranial nerve deficit.  Skin:     Psychiatric: He has a normal mood and affect. His behavior is normal. Judgment and thought content normal.  Nursing note and vitals  reviewed.         Assessment & Plan:   Skin tear of left forearm without complication, initial encounter No Follow-up on file.

## 2014-11-07 NOTE — Progress Notes (Signed)
Pre visit review using our clinic review tool, if applicable. No additional management support is needed unless otherwise documented below in the visit note. 

## 2014-11-07 NOTE — Assessment & Plan Note (Signed)
New- his skin is easily bruised and friable. Currently skin tear does not appear infected. Applied abx ointment and gauze wrap. Advised NOT to use adhesive tape or bandages since his skin tears so easily.  Discussed warning signs that could indicate infection. Call or return to clinic prn if these symptoms worsen or fail to improve as anticipated.  The patient indicates understanding of these issues and agrees with the plan.

## 2015-01-14 ENCOUNTER — Other Ambulatory Visit: Payer: Self-pay | Admitting: Family Medicine

## 2015-01-27 ENCOUNTER — Other Ambulatory Visit (INDEPENDENT_AMBULATORY_CARE_PROVIDER_SITE_OTHER): Payer: Medicare Other

## 2015-01-27 ENCOUNTER — Ambulatory Visit (INDEPENDENT_AMBULATORY_CARE_PROVIDER_SITE_OTHER): Payer: Medicare Other | Admitting: Internal Medicine

## 2015-01-27 ENCOUNTER — Encounter: Payer: Self-pay | Admitting: Internal Medicine

## 2015-01-27 VITALS — BP 128/64 | HR 80 | Ht 65.35 in | Wt 209.0 lb

## 2015-01-27 DIAGNOSIS — Z8711 Personal history of peptic ulcer disease: Secondary | ICD-10-CM

## 2015-01-27 DIAGNOSIS — F1019 Alcohol abuse with unspecified alcohol-induced disorder: Secondary | ICD-10-CM

## 2015-01-27 DIAGNOSIS — Z86010 Personal history of colonic polyps: Secondary | ICD-10-CM | POA: Diagnosis not present

## 2015-01-27 DIAGNOSIS — K7460 Unspecified cirrhosis of liver: Secondary | ICD-10-CM | POA: Diagnosis not present

## 2015-01-27 DIAGNOSIS — Z8719 Personal history of other diseases of the digestive system: Secondary | ICD-10-CM

## 2015-01-27 LAB — CBC WITH DIFFERENTIAL/PLATELET
BASOS PCT: 1.1 % (ref 0.0–3.0)
Basophils Absolute: 0.1 10*3/uL (ref 0.0–0.1)
EOS PCT: 3.2 % (ref 0.0–5.0)
Eosinophils Absolute: 0.3 10*3/uL (ref 0.0–0.7)
HEMATOCRIT: 38.5 % — AB (ref 39.0–52.0)
Hemoglobin: 13 g/dL (ref 13.0–17.0)
LYMPHS ABS: 1.4 10*3/uL (ref 0.7–4.0)
LYMPHS PCT: 15.9 % (ref 12.0–46.0)
MCHC: 33.7 g/dL (ref 30.0–36.0)
MCV: 93.7 fl (ref 78.0–100.0)
MONOS PCT: 8.6 % (ref 3.0–12.0)
Monocytes Absolute: 0.8 10*3/uL (ref 0.1–1.0)
NEUTROS ABS: 6.3 10*3/uL (ref 1.4–7.7)
NEUTROS PCT: 71.2 % (ref 43.0–77.0)
PLATELETS: 209 10*3/uL (ref 150.0–400.0)
RBC: 4.11 Mil/uL — ABNORMAL LOW (ref 4.22–5.81)
RDW: 14.2 % (ref 11.5–15.5)
WBC: 8.8 10*3/uL (ref 4.0–10.5)

## 2015-01-27 LAB — COMPREHENSIVE METABOLIC PANEL
ALT: 18 U/L (ref 0–53)
AST: 32 U/L (ref 0–37)
Albumin: 3.2 g/dL — ABNORMAL LOW (ref 3.5–5.2)
Alkaline Phosphatase: 144 U/L — ABNORMAL HIGH (ref 39–117)
BILIRUBIN TOTAL: 0.7 mg/dL (ref 0.2–1.2)
BUN: 5 mg/dL — ABNORMAL LOW (ref 6–23)
CALCIUM: 8.1 mg/dL — AB (ref 8.4–10.5)
CHLORIDE: 92 meq/L — AB (ref 96–112)
CO2: 19 meq/L (ref 19–32)
Creatinine, Ser: 0.79 mg/dL (ref 0.40–1.50)
GFR: 103.53 mL/min (ref >60.00)
GLUCOSE: 96 mg/dL (ref 70–99)
POTASSIUM: 4.4 meq/L (ref 3.5–5.1)
Sodium: 131 mEq/L — ABNORMAL LOW (ref 135–145)
Total Protein: 6.5 g/dL (ref 6.0–8.3)

## 2015-01-27 LAB — PROTIME-INR
INR: 1.4 ratio — AB (ref 0.8–1.0)
Prothrombin Time: 14.4 s — ABNORMAL HIGH (ref 9.6–13.1)

## 2015-01-27 MED ORDER — NA SULFATE-K SULFATE-MG SULF 17.5-3.13-1.6 GM/177ML PO SOLN: ORAL | Status: DC

## 2015-01-27 NOTE — Patient Instructions (Addendum)
You have been scheduled for an endoscopy and colonoscopy. Please follow the written instructions given to you at your visit today. Please pick up your prep supplies at the pharmacy within the next 1-3 days. If you use inhalers (even only as needed), please bring them with you on the day of your procedure. Your physician has requested that you go to www.startemmi.com and enter the access code given to you at your visit today. This web site gives a general overview about your procedure. However, you should still follow specific instructions given to you by our office regarding your preparation for the procedure.  Your physician has requested that you go to the basement for the following lab work before leaving today: CBC, CMP, INR, Hep C antibody  You have been scheduled for an abdominal ultrasound with elastography at [facility] (1st floor of hospital) on 02/11/15 at 10:00 am. Please arrive 15 minutes prior to your appointment for registration. Make certain not to have anything to eat or drink 6 hours prior to your appointment. Should you need to reschedule your appointment, please contact radiology at [phone]. This test typically takes about 30 minutes to perform.

## 2015-01-27 NOTE — Progress Notes (Signed)
Patient ID: [name], male   DOB: 09-Jun-1945, 70 y.o.   MRN: [id number]  HPI: [name] 70 yo male with past medical history of alcohol abuse, possible cirrhosis, history of GERD, history of peptic ulcer disease secondary to NSAID use, history of adenomatous colon polyps, history of iron deficiency anemia who is here for follow-up. He has not been seen in clinic in over 4 years. He reports overall he is feeling well. He continues to drink alcohol but states he has cut down considerably. He is drinking about 2 glasses of wine per day. He denies abdominal pain. He does have nausea when taking oral iron supplements. When he has nausea he uses promethazine on an as-needed basis. He states he is avoiding ibuprofen given his history of ulcers but has been using Aleve for joint pains. He denies dysphagia and odynophagia. Denies increasing abdominal swelling or lower extremity edema. No jaundice or dark urine. No itching.  Of note he has had a prior liver biopsy, 2006, which showed steatohepatitis and increased iron levels consistent with alcohol abuse. At that time there was no cirrhosis. Prior CT chest showed nodular liver several years ago  He is taking Aldactone 50 mg daily.  Upper endoscopy was performed on 09/17/2010. This shows slightly irregular Z line, nonobstructing Schatzki's ring, 2 antral ulcers and moderate gastritis and mild widening 9 [place]. Last colonoscopy 08/26/2010 moderate diverticulosis in the left colon. 6 mm ascending colon polyp removed with hot snare found to be tubular adenoma  Past Medical History  Diagnosis Date  . HLD (hyperlipidemia) 5/99  . HTN (hypertension) 5/99  . BPH (benign prostatic hypertrophy) 5/99  . GERD (gastroesophageal reflux disease) 07/31/04    gastritis   . Anxiety   . Allergy     pollen/ragweed  . Adenomatous polyp of colon 2006  . Hearing difficulty   . Humerus fracture 11/02/2011    LUE  . Peripheral vascular disease ([place])   . Pneumonia 1996   hosp  . Chronic bronchitis ([place])     "q year or q other year" (11/02/2011)  . History of blood transfusion 2012  . Anemia 2012  . Hernia, umbilical     "unrepaired" (11/02/2011)  . H/O hiatal hernia   . Memory loss     "recently has been forgetting alot; maybe related to the alcohol" (11/02/2011)  . Falls frequently     "daily lately; legs just give out" (11/02/2011)  . H/O nausea   . Depression     Hx of  . Shortness of breath     with exertion  . Seasonal allergies   . Colon polyp     Past Surgical History  Procedure Laterality Date  . L duputryen contractive surgery    . Neck mri  6/04    C5-6 disc abnormality  . Colonoscopy  07/31/04    multiple polyps; repeat in 3 years  . Hosp cp r/o'd 2/24  03/08/07  . Ett myoview  03/09/07    nml EF 66%  . Upper gastrointestinal endoscopy    . Cataract extraction w/ intraocular lens  implant, bilateral  10/2002  . Tonsillectomy      "I was young" (11/02/2011)  . Hemorrhoid surgery      "cauterized a long time ago" (11/02/2011)  . Esophageal varice ligation  2012  . Orif humerus fracture  11/04/2011    Procedure: OPEN REDUCTION INTERNAL FIXATION (ORIF) PROXIMAL HUMERUS FRACTURE;  Surgeon: Rozanna Box, MD;  Location: [facility];  Service: Orthopedics;  Laterality: Left;  . Humerus fracture surgery Left 11/04/11    with plate to the shoulder  . Posterior cervical fusion/foraminotomy N/A 03/21/2012    Procedure: POSTERIOR CERVICAL FUSION/FORAMINOTOMY LEVEL ONE;  Surgeon: Charlie Pitter, MD;  Location: [facility];  Service: Neurosurgery;  Laterality: N/A;  POSTERIOR CERVICAL ONE TO CERVICAL TWO FUSION WITH ILIAC CREST GRAFT AND LATERAL MASS SCREWS     Outpatient Prescriptions Prior to Visit  Medication Sig Dispense Refill  . cyclobenzaprine (FLEXERIL) 10 MG tablet Take 1 tablet (10 mg total) by mouth daily as needed for muscle spasms. 90 tablet 1  . EPINEPHrine (EPIPEN 2-PAK) 0.3 mg/0.3 mL IJ SOAJ injection INJECT IN TO THE MUSCLE AS  DIRECTED FOR SEVERE ALLERGICE REACTION 2 Device 1  . Magnesium 500 MG TABS Take 500 mg by mouth daily.     . promethazine (PHENERGAN) 25 MG tablet TAKE 1 TABLET DAILY AS NEEDED FOR NAUSEA 90 tablet 0  . QUEtiapine (SEROQUEL) 300 MG tablet TAKE 1 TABLET AT BEDTIME 90 tablet 2  . spironolactone (ALDACTONE) 50 MG tablet Take 1 tablet (50 mg total) by mouth daily. 90 tablet 3  . Thiamine HCl (VITAMIN B-1) 100 MG tablet Take 100 mg by mouth daily.     . traZODone (DESYREL) 50 MG tablet Take 1 tablet (50 mg total) by mouth at bedtime. 90 tablet 0  . zolpidem (AMBIEN) 5 MG tablet Take 1 tablet (5 mg total) by mouth at bedtime as needed for sleep. 90 tablet 0  . camphor-menthol (SARNA) lotion Apply topically as needed 222 mL 0  . pantoprazole (PROTONIX) 40 MG tablet Take 40 mg by mouth 2 (two) times daily.     No facility-administered medications prior to visit.    Allergies  Allergen Reactions  . Bee Venom Swelling  . Nifedipine     REACTION: SWELLING 11/02/2011 pt denies this allergy.    Family History  Problem Relation Age of Onset  . Heart disease Father   . Diabetes Father   . Stroke Father   . Depression Mother   . Heart disease Brother   . Alcohol abuse Brother   . Liver disease Brother     Social History  Substance Use Topics  . Smoking status: Never Smoker   . Smokeless tobacco: Never Used  . Alcohol Use: 540.0 oz/week    900 Glasses of wine per week     Comment: 11/02/2011 "large box of wine q 2 days" 01-27-15 2 glass a day of wine    ROS: As per history of present illness, otherwise negative  BP 128/64 mmHg  Pulse 80  Ht 5' 5.35" (1.66 m)  Wt 209 lb (94.802 kg)  BMI 34.40 kg/m2 Constitutional: Well-developed and well-nourished, though somewhat disheveled. No distress. HEENT: Normocephalic and atraumatic. Oropharynx is clear and moist. No oropharyngeal exudate. Conjunctivae are normal.  No scleral icterus. Neck: Neck supple. Trachea midline. Cardiovascular:  Normal rate, regular rhythm and intact distal pulses.  Pulmonary/chest: Effort normal and breath sounds normal. No wheezing, rales or rhonchi. Abdominal: Soft, nontender,  Bowel sounds active throughout. Reducible umbilical hernia noted, no appreciable hepatosplenomegaly in an obese abdomen. Extremities: no clubbing, cyanosis, or edema Neurological: Alert and oriented to person place and time.  Mild tremor without asterixis Skin: Skin is warm and dry. No rashes noted. Psychiatric: Normal mood and affect. Behavior is normal.  RELEVANT LABS AND IMAGING: CBC    Component Value Date/Time   WBC 9.7 10/24/2014 0845  RBC 4.06* 10/24/2014 0845   RBC 2.71* 09/15/2010 1938   HGB 13.3 10/24/2014 0845   HCT 38.6* 10/24/2014 0845   PLT 226.0 10/24/2014 0845   MCV 94.9 10/24/2014 0845   MCH 27.8 03/22/2012 0726   MCHC 34.6 10/24/2014 0845   RDW 14.9 10/24/2014 0845   LYMPHSABS 1.4 10/24/2014 0845   MONOABS 1.1* 10/24/2014 0845   EOSABS 0.1 10/24/2014 0845   BASOSABS 0.0 10/24/2014 0845    CMP     Component Value Date/Time   NA 134* 10/24/2014 0845   K 4.6 10/24/2014 0845   CL 96 10/24/2014 0845   CO2 28 10/24/2014 0845   GLUCOSE 119* 10/24/2014 0845   BUN 5* 10/24/2014 0845   CREATININE 0.88 10/24/2014 0845   CALCIUM 9.1 10/24/2014 0845   PROT 6.2 10/24/2014 0845   ALBUMIN 3.3* 10/24/2014 0845   AST 28 10/24/2014 0845   ALT 16 10/24/2014 0845   ALKPHOS 136* 10/24/2014 0845   BILITOT 0.8 10/24/2014 0845   GFRNONAA 59* 03/22/2012 0726   GFRAA 68* 03/22/2012 0726   Lab Results  Component Value Date   INR 1.34 11/08/2011   INR 1.58* 11/05/2011   INR 1.33 11/03/2011   ASSESSMENT/PLAN: 70 yo male with past medical history of alcohol abuse, possible cirrhosis, history of GERD, history of peptic ulcer disease secondary to NSAID use, history of adenomatous colon polyps, history of iron deficiency anemia who is here for follow-up.   1. Alcohol abuse/possible cirrhosis -- we have  discussed his alcohol use and I have recommended cessation given high risk for cirrhosis in the setting of history of fatty liver disease (alcohol-induced) and liver nodularity seen on prior imaging. I recommended CBC, CMP and INR today. I've also recommended ultrasound with elastography to get objective evidence of fibrosis. If cirrhosis is confirmed, he will need every six-month [place] screening. I have recommended upper endoscopy for variceal screening at this time. Check hep C antibody. There is no evidence of significant ascites or lower extremity today and he has not had prior evidence of thrombocytopenia.  2. History of colon polyps -- colonoscopy recommended now for surveillance. We discussed the risks, benefits and alternatives and he is agreeable to proceed  3. History of gastric ulcers -- I informed him that Aleve is just as risky as ibuprofen and formation of stomach ulcers. I've asked that he try to avoid NSAIDs. Upper endoscopy being performed as discussed in #1       Cc:Lucille Passy, Md [address] [place], [name] [address]

## 2015-01-28 LAB — HEPATITIS C ANTIBODY: HCV Ab: NEGATIVE

## 2015-01-29 ENCOUNTER — Other Ambulatory Visit: Payer: Self-pay | Admitting: Family Medicine

## 2015-02-11 ENCOUNTER — Ambulatory Visit (HOSPITAL_COMMUNITY)
Admission: RE | Admit: 2015-02-11 | Discharge: 2015-02-11 | Disposition: A | Discharge status: Home / Self Care | Payer: Medicare Other | Source: Ambulatory Visit | Attending: Internal Medicine | Admitting: Internal Medicine

## 2015-02-11 DIAGNOSIS — K7460 Unspecified cirrhosis of liver: Secondary | ICD-10-CM | POA: Diagnosis not present

## 2015-02-11 DIAGNOSIS — K8020 Calculus of gallbladder without cholecystitis without obstruction: Secondary | ICD-10-CM | POA: Diagnosis not present

## 2015-02-11 DIAGNOSIS — K760 Fatty (change of) liver, not elsewhere classified: Secondary | ICD-10-CM | POA: Insufficient documentation

## 2015-02-13 ENCOUNTER — Ambulatory Visit (INDEPENDENT_AMBULATORY_CARE_PROVIDER_SITE_OTHER): Payer: Medicare Other | Admitting: Primary Care

## 2015-02-13 VITALS — BP 126/84 | HR 108 | Temp 98.1°F | Ht 66.0 in | Wt 207.4 lb

## 2015-02-13 DIAGNOSIS — R21 Rash and other nonspecific skin eruption: Secondary | ICD-10-CM | POA: Diagnosis not present

## 2015-02-13 DIAGNOSIS — W57XXXA Bitten or stung by nonvenomous insect and other nonvenomous arthropods, initial encounter: Secondary | ICD-10-CM

## 2015-02-13 MED ORDER — PREDNISONE 10 MG PO TABS: ORAL_TABLET | ORAL | Status: DC

## 2015-02-13 MED ORDER — METHYLPREDNISOLONE ACETATE 40 MG/ML IJ SUSP
40.0000 mg | Freq: Once | INTRAMUSCULAR | Status: AC
  Administered 2015-02-13: 40 mg via INTRAMUSCULAR

## 2015-02-13 MED ORDER — PERMETHRIN 5 % EX CREA: 1.0000 "application " | TOPICAL_CREAM | Freq: Once | CUTANEOUS | Status: DC

## 2015-02-13 NOTE — Addendum Note (Signed)
Addended by: Jacqualin Combes on: 02/13/2015 08:45 AM   Modules accepted: Orders

## 2015-02-13 NOTE — Patient Instructions (Addendum)
Start Prednisone tablets tomorrow. Take 2 tablets for 3 days, then 1 tablet for 2 days, then stop.  Please call me if no improvement in itching in 24 hours.   It was a pleasure meeting you!

## 2015-02-13 NOTE — Progress Notes (Signed)
Subjective:    Patient ID: [name], male    DOB: 1945/04/15, 69 y.o.   MRN: [id number]  HPI  [name] is a 70 year old male who presents today with a chief complaint of rash. His rash is located to his upper and lower extremities, anterior and posterior trunk and had been present for about 10 days. He's applied OTC hydrocortisone cream and taken benadryl without improvement. He denies changes in soaps, detergents, medication, foods. He's not been outside in wooded areas. No one else in his household is itching.   Review of Systems  Constitutional: Negative for fever.  HENT:       No swelling to pharynx  Respiratory: Negative for shortness of breath.   Skin: Positive for rash.       Itching       Past Medical History  Diagnosis Date  . HLD (hyperlipidemia) 5/99  . HTN (hypertension) 5/99  . BPH (benign prostatic hypertrophy) 5/99  . GERD (gastroesophageal reflux disease) 07/31/04    gastritis   . Anxiety   . Allergy     pollen/ragweed  . Adenomatous polyp of colon 2006  . Hearing difficulty   . Humerus fracture 11/02/2011    LUE  . Peripheral vascular disease ([place])   . Pneumonia 1996    hosp  . Chronic bronchitis ([place])     "q year or q other year" (11/02/2011)  . History of blood transfusion 2012  . Anemia 2012  . Hernia, umbilical     "unrepaired" (11/02/2011)  . H/O hiatal hernia   . Memory loss     "recently has been forgetting alot; maybe related to the alcohol" (11/02/2011)  . Falls frequently     "daily lately; legs just give out" (11/02/2011)  . H/O nausea   . Depression     Hx of  . Shortness of breath     with exertion  . Seasonal allergies   . Colon polyp     Social History   Social History  . Marital Status: Married    Spouse Name: N/A  . Number of Children: 3  . Years of Education: N/A   Occupational History  . color matcher    Social History Main Topics  . Smoking status: Never Smoker   . Smokeless tobacco: Never Used  . Alcohol  Use: 540.0 oz/week    900 Glasses of wine per week     Comment: 11/02/2011 "large box of wine q 2 days" 01-27-15 2 glass a day of wine  . Drug Use: No  . Sexual Activity: No   Other Topics Concern  . Not on file   Social History Narrative   Married (remarried), lives with wife; 3 children, 1 at home.    [employer] - mixes colors       Pt revised designated party release form Dorie Haxton, wife [phone] - can leave msg.     Allen Norris 03/04/10.        Would desire CPR.  Would not want prolonged life support if futile.    Past Surgical History  Procedure Laterality Date  . L duputryen contractive surgery    . Neck mri  6/04    C5-6 disc abnormality  . Colonoscopy  07/31/04    multiple polyps; repeat in 3 years  . Hosp cp r/o'd 2/24  03/08/07  . Ett myoview  03/09/07    nml EF 66%  . Upper gastrointestinal endoscopy    .  Cataract extraction w/ intraocular lens  implant, bilateral  10/2002  . Tonsillectomy      "I was young" (11/02/2011)  . Hemorrhoid surgery      "cauterized a long time ago" (11/02/2011)  . Esophageal varice ligation  2012  . Orif humerus fracture  11/04/2011    Procedure: OPEN REDUCTION INTERNAL FIXATION (ORIF) PROXIMAL HUMERUS FRACTURE;  Surgeon: Rozanna Box, MD;  Location: [facility];  Service: Orthopedics;  Laterality: Left;  . Humerus fracture surgery Left 11/04/11    with plate to the shoulder  . Posterior cervical fusion/foraminotomy N/A 03/21/2012    Procedure: POSTERIOR CERVICAL FUSION/FORAMINOTOMY LEVEL ONE;  Surgeon: Charlie Pitter, MD;  Location: [facility];  Service: Neurosurgery;  Laterality: N/A;  POSTERIOR CERVICAL ONE TO CERVICAL TWO FUSION WITH ILIAC CREST GRAFT AND LATERAL MASS SCREWS     Family History  Problem Relation Age of Onset  . Heart disease Father   . Diabetes Father   . Stroke Father   . Depression Mother   . Heart disease Brother   . Alcohol abuse Brother   . Liver disease Brother     Allergies  Allergen  Reactions  . Bee Venom Swelling  . Nifedipine     REACTION: SWELLING 11/02/2011 pt denies this allergy.    Current Outpatient Prescriptions on File Prior to Visit  Medication Sig Dispense Refill  . B Complex Vitamins (VITAMIN B COMPLEX PO) Take by mouth.    . cyclobenzaprine (FLEXERIL) 10 MG tablet Take 1 tablet (10 mg total) by mouth daily as needed for muscle spasms. 90 tablet 1  . EPINEPHrine (EPIPEN 2-PAK) 0.3 mg/0.3 mL IJ SOAJ injection INJECT IN TO THE MUSCLE AS DIRECTED FOR SEVERE ALLERGICE REACTION 2 Device 1  . Magnesium 500 MG TABS Take 500 mg by mouth daily.     . methocarbamol (ROBAXIN) 500 MG tablet Take 500 mg by mouth 4 (four) times daily.    . Na Sulfate-K Sulfate-Mg Sulf SOLN Suprep-Use as directed 354 mL 0  . promethazine (PHENERGAN) 25 MG tablet TAKE 1 TABLET DAILY AS NEEDED FOR NAUSEA 90 tablet 0  . QUEtiapine (SEROQUEL) 300 MG tablet TAKE 1 TABLET AT BEDTIME 90 tablet 2  . spironolactone (ALDACTONE) 50 MG tablet Take 1 tablet (50 mg total) by mouth daily. 90 tablet 3  . Thiamine HCl (VITAMIN B-1) 100 MG tablet Take 100 mg by mouth daily.     . traZODone (DESYREL) 50 MG tablet TAKE 1 TABLET BY MOUTH AT BEDTIME 30 tablet 1  . zolpidem (AMBIEN) 5 MG tablet Take 1 tablet (5 mg total) by mouth at bedtime as needed for sleep. 90 tablet 0   No current facility-administered medications on file prior to visit.    BP 126/84 mmHg  Pulse 108  Temp(Src) 98.1 F (36.7 C) (Oral)  Ht 5\' 6"  (1.676 m)  Wt 207 lb 6.4 oz (94.076 kg)  BMI 33.49 kg/m2  SpO2 94%    Objective:   Physical Exam  Constitutional: He appears well-nourished.  Cardiovascular: Normal rate and regular rhythm.   Pulmonary/Chest: Effort normal and breath sounds normal.  Skin: Skin is warm. There is erythema.  Widespread rash to extremities and anterior trunk. Small abrasions, likely from scratching. No actual bug bites noted. No burrowing to webs of fingers or toes noted.          Assessment &  Plan:  Rash:  Located to extremities and trunk. Small abrasions present to body, but appear to be due  to scratching rather than actual bites. No obvious burrowing between webs of fingers or toes. No changes in meds, soaps, foods, routine. No one else in household is itching.   Unsure of etiology of itching/rash. Will treat with IM depo medrol today and short prednisone taper starting tomorrow.  Discussed to clean all linens and carpets regardless, although suspicion for scabies or bed bugs is low.  He is to notify us if no improvement in the next several days.

## 2015-02-17 ENCOUNTER — Encounter: Payer: Self-pay | Admitting: Internal Medicine

## 2015-02-17 ENCOUNTER — Ambulatory Visit (AMBULATORY_SURGERY_CENTER): Payer: Medicare Other | Admitting: Internal Medicine

## 2015-02-17 VITALS — BP 136/83 | HR 146 | Temp 98.0°F | Resp 12 | Ht 66.0 in | Wt 207.0 lb

## 2015-02-17 DIAGNOSIS — D120 Benign neoplasm of cecum: Secondary | ICD-10-CM

## 2015-02-17 DIAGNOSIS — M542 Cervicalgia: Secondary | ICD-10-CM | POA: Diagnosis not present

## 2015-02-17 DIAGNOSIS — F1010 Alcohol abuse, uncomplicated: Secondary | ICD-10-CM | POA: Diagnosis not present

## 2015-02-17 DIAGNOSIS — S0993XA Unspecified injury of face, initial encounter: Secondary | ICD-10-CM | POA: Diagnosis present

## 2015-02-17 DIAGNOSIS — F3289 Other specified depressive episodes: Secondary | ICD-10-CM | POA: Diagnosis not present

## 2015-02-17 DIAGNOSIS — S42309D Unspecified fracture of shaft of humerus, unspecified arm, subsequent encounter for fracture with routine healing: Secondary | ICD-10-CM | POA: Diagnosis not present

## 2015-02-17 DIAGNOSIS — IMO0002 Reserved for concepts with insufficient information to code with codable children: Secondary | ICD-10-CM | POA: Diagnosis not present

## 2015-02-17 DIAGNOSIS — R609 Edema, unspecified: Secondary | ICD-10-CM | POA: Diagnosis not present

## 2015-02-17 DIAGNOSIS — K635 Polyp of colon: Secondary | ICD-10-CM | POA: Diagnosis not present

## 2015-02-17 DIAGNOSIS — M25519 Pain in unspecified shoulder: Secondary | ICD-10-CM | POA: Diagnosis not present

## 2015-02-17 DIAGNOSIS — D62 Acute posthemorrhagic anemia: Secondary | ICD-10-CM | POA: Diagnosis not present

## 2015-02-17 DIAGNOSIS — S139XXA Sprain of joints and ligaments of unspecified parts of neck, initial encounter: Secondary | ICD-10-CM | POA: Diagnosis not present

## 2015-02-17 DIAGNOSIS — K7460 Unspecified cirrhosis of liver: Secondary | ICD-10-CM | POA: Diagnosis not present

## 2015-02-17 DIAGNOSIS — Z86010 Personal history of colonic polyps: Secondary | ICD-10-CM

## 2015-02-17 DIAGNOSIS — I10 Essential (primary) hypertension: Secondary | ICD-10-CM | POA: Diagnosis not present

## 2015-02-17 DIAGNOSIS — K219 Gastro-esophageal reflux disease without esophagitis: Secondary | ICD-10-CM | POA: Diagnosis not present

## 2015-02-17 DIAGNOSIS — R1310 Dysphagia, unspecified: Secondary | ICD-10-CM | POA: Diagnosis not present

## 2015-02-17 DIAGNOSIS — F1020 Alcohol dependence, uncomplicated: Secondary | ICD-10-CM | POA: Diagnosis not present

## 2015-02-17 DIAGNOSIS — K209 Esophagitis, unspecified: Secondary | ICD-10-CM | POA: Diagnosis not present

## 2015-02-17 DIAGNOSIS — M79609 Pain in unspecified limb: Secondary | ICD-10-CM | POA: Diagnosis not present

## 2015-02-17 DIAGNOSIS — K7030 Alcoholic cirrhosis of liver without ascites: Secondary | ICD-10-CM | POA: Diagnosis not present

## 2015-02-17 DIAGNOSIS — D123 Benign neoplasm of transverse colon: Secondary | ICD-10-CM

## 2015-02-17 DIAGNOSIS — D124 Benign neoplasm of descending colon: Secondary | ICD-10-CM

## 2015-02-17 DIAGNOSIS — F411 Generalized anxiety disorder: Secondary | ICD-10-CM | POA: Diagnosis not present

## 2015-02-17 DIAGNOSIS — Z043 Encounter for examination and observation following other accident: Secondary | ICD-10-CM | POA: Diagnosis not present

## 2015-02-17 DIAGNOSIS — F329 Major depressive disorder, single episode, unspecified: Secondary | ICD-10-CM | POA: Diagnosis not present

## 2015-02-17 DIAGNOSIS — S335XXA Sprain of ligaments of lumbar spine, initial encounter: Secondary | ICD-10-CM | POA: Diagnosis not present

## 2015-02-17 DIAGNOSIS — I739 Peripheral vascular disease, unspecified: Secondary | ICD-10-CM | POA: Diagnosis not present

## 2015-02-17 DIAGNOSIS — N400 Enlarged prostate without lower urinary tract symptoms: Secondary | ICD-10-CM | POA: Diagnosis not present

## 2015-02-17 DIAGNOSIS — S42309S Unspecified fracture of shaft of humerus, unspecified arm, sequela: Secondary | ICD-10-CM | POA: Diagnosis not present

## 2015-02-17 DIAGNOSIS — R112 Nausea with vomiting, unspecified: Secondary | ICD-10-CM | POA: Diagnosis not present

## 2015-02-17 DIAGNOSIS — R062 Wheezing: Secondary | ICD-10-CM | POA: Diagnosis not present

## 2015-02-17 DIAGNOSIS — D539 Nutritional anemia, unspecified: Secondary | ICD-10-CM | POA: Diagnosis not present

## 2015-02-17 DIAGNOSIS — Z23 Encounter for immunization: Secondary | ICD-10-CM | POA: Diagnosis not present

## 2015-02-17 DIAGNOSIS — S0990XA Unspecified injury of head, initial encounter: Secondary | ICD-10-CM | POA: Diagnosis not present

## 2015-02-17 MED ORDER — SODIUM CHLORIDE 0.9 % IV SOLN: 500.0000 mL | INTRAVENOUS | Status: DC

## 2015-02-17 NOTE — Progress Notes (Signed)
A/ox3, pleased with MAC, report to RN 

## 2015-02-17 NOTE — Patient Instructions (Addendum)
YOU HAD AN ENDOSCOPIC PROCEDURE TODAY AT [facility]:   Refer to the procedure report that was given to you for any specific questions about what was found during the examination.  If the procedure report does not answer your questions, please call your gastroenterologist to clarify.  If you requested that your care partner not be given the details of your procedure findings, then the procedure report has been included in a sealed envelope for you to review at your convenience later.  YOU SHOULD EXPECT: Some feelings of bloating in the abdomen. Passage of more gas than usual.  Walking can help get rid of the air that was put into your GI tract during the procedure and reduce the bloating. If you had a lower endoscopy (such as a colonoscopy or flexible sigmoidoscopy) you may notice spotting of blood in your stool or on the toilet paper. If you underwent a bowel prep for your procedure, you may not have a normal bowel movement for a few days.  Please Note:  You might notice some irritation and congestion in your nose or some drainage.  This is from the oxygen used during your procedure.  There is no need for concern and it should clear up in a day or so.  SYMPTOMS TO REPORT IMMEDIATELY:   Following lower endoscopy (colonoscopy or flexible sigmoidoscopy):  Excessive amounts of blood in the stool  Significant tenderness or worsening of abdominal pains  Swelling of the abdomen that is new, acute  Fever of 100F or higher   Following upper endoscopy (EGD)  Vomiting of blood or coffee ground material  New chest pain or pain under the shoulder blades  Painful or persistently difficult swallowing  New shortness of breath  Fever of 100F or higher  Black, tarry-looking stools  For urgent or emergent issues, a gastroenterologist can be reached at any hour by calling [phone].   DIET: Your first meal following the procedure should be a small meal and then it is ok to progress to  your normal diet. Heavy or fried foods are harder to digest and may make you feel nauseous or bloated.  Likewise, meals heavy in dairy and vegetables can increase bloating.  Drink plenty of fluids but you should avoid alcoholic beverages for 24 hours. Try to increase the fiber in your diet.  ACTIVITY:  You should plan to take it easy for the rest of today and you should NOT DRIVE or use heavy machinery until tomorrow (because of the sedation medicines used during the test).    FOLLOW UP: Our staff will call the number listed on your records the next business day following your procedure to check on you and address any questions or concerns that you may have regarding the information given to you following your procedure. If we do not reach you, we will leave a message.  However, if you are feeling well and you are not experiencing any problems, there is no need to return our call.  We will assume that you have returned to your regular daily activities without incident.  If any biopsies were taken you will be contacted by phone or by letter within the next 1-3 weeks.  Please call us at [phone] if you have not heard about the biopsies in 3 weeks.    SIGNATURES/CONFIDENTIALITY: You and/or your care partner have signed paperwork which will be entered into your electronic medical record.  These signatures attest to the fact that that the information above  on your After Visit Summary has been reviewed and is understood.  Full responsibility of the confidentiality of this discharge information lies with you and/or your care-partner.  Dr. Hilarie Fredrickson would like for you to take your pantoprazole 40 mg every day 1/2 hour before breakfast.    Try to stop drinking alcohol if possible . Your liver is stressed, and your esophagus is eroding due to the affects.

## 2015-02-17 NOTE — Progress Notes (Signed)
Pt. Has rapid heart rate on admission to unit.  Flutuates bedtween 55-130.  Laural Roes CRNA  In to evaluated heart rate.  Will do procedure as planned.

## 2015-02-17 NOTE — Progress Notes (Signed)
Called to room to assist during endoscopic procedure.  Patient ID and intended procedure confirmed with present staff. Received instructions for my participation in the procedure from the performing physician.  

## 2015-02-17 NOTE — Op Note (Signed)
[facility]  [address] [place], [id number]   ENDOSCOPY PROCEDURE REPORT  PATIENT: Sean, Smith  MR#: [id number] BIRTHDATE: 1945-07-10 , 69  yrs. old GENDER: male ENDOSCOPIST: Jerene Bears, MD REFERRED BY:  [name], M.D. PROCEDURE DATE:  02/17/2015 PROCEDURE:  EGD, diagnostic and EGD w/ biopsy ASA CLASS:     Class III INDICATIONS:  variceal screening in patient with cirrhosis. MEDICATIONS: Monitored anesthesia care and Propofol 200 mg IV TOPICAL ANESTHETIC: none  DESCRIPTION OF PROCEDURE: After the risks benefits and alternatives of the procedure were thoroughly explained, informed consent was obtained.  The [id number] endoscope was introduced through the mouth and advanced to the second portion of the duodenum , Without limitations.  The instrument was slowly withdrawn as the mucosa was fully examined.  ESOPHAGUS: There was a non-circumferential 2 cm segment of suspected Barrett's esophagus found 39 cm from the incisors.  There was no nodular mucosa noted in the possible Barrett's segment.  Multiple biopsies were performed using cold forceps.  Sample obtained to rule out Barrett's esophagus (if confirmed C0,M1-2)  Otherwise normal esophageal mucosa with no evidence of esophageal varices.  STOMACH: The mucosa of the stomach appeared normal.   No gastric varices.  DUODENUM: The duodenal mucosa showed no abnormalities in the bulb and 2nd part of the duodenum. Retroflexed views revealed no abnormalities.     The scope was then withdrawn from the patient and the procedure completed.  COMPLICATIONS: There were no immediate complications.  ENDOSCOPIC IMPRESSION: 1.   There was a 2cm segment of suspected Barrett's esophagus found 39 cm from the incisors; multiple biopsies were performed 2.   Otherwise normal esophageal mucosa with no evidence of esophageal varices 3.   The mucosa of the stomach appeared normal 4.   The duodenal mucosa showed  no abnormalities in the bulb and 2nd part of the duodenum  RECOMMENDATIONS: 1.  Await biopsy results 2.  Begin omeprazole 40 mg daily given probable Barrett's esophagus 3.  Proceed with a Colonoscopy.  eSigned:  Jerene Bears, MD 02/17/2015 3:08 PM CC: the patient, Dr. Deborra Medina

## 2015-02-17 NOTE — Op Note (Signed)
[facility]  [address] [place], [address]   COLONOSCOPY PROCEDURE REPORT  PATIENT: [name], [name]  MR#: [id number] BIRTHDATE: 01/20/1945 , 69  yrs. old GENDER: male ENDOSCOPIST: Jerene Bears, MD REFERRED [id number] Aron, M.D. PROCEDURE DATE:  02/17/2015 PROCEDURE:   Colonoscopy, surveillance , Colonoscopy with cold biopsy polypectomy, and Colonoscopy with snare polypectomy First Screening Colonoscopy - Avg.  risk and is 50 yrs.  old or older - No.  Prior Negative Screening - Now for repeat screening. N/A  History of Adenoma - Now for follow-up colonoscopy & has been > or = to 3 yrs.  Yes hx of adenoma.  Has been 3 or more years since last colonoscopy.  Polyps removed today? Yes ASA CLASS:   Class III INDICATIONS:Surveillance due to prior colonic neoplasia and PH Colon Adenoma. MEDICATIONS: Monitored anesthesia care, this was the total dose used for all procedures at this session, and Propofol 600 mg IV  DESCRIPTION OF PROCEDURE:   After the risks benefits and alternatives of the procedure were thoroughly explained, informed consent was obtained.  The digital rectal exam revealed no rectal mass.   The LB [id number]  endoscope was introduced through the anus and advanced to the cecum, which was identified by both the appendix and ileocecal valve. No adverse events experienced. The quality of the prep was fair  requiring copious irrigation lavage.  (Suprep was used)  The instrument was then slowly withdrawn as the colon was fully examined. Estimated blood loss is zero unless otherwise noted in this procedure report.  COLON FINDINGS: A sessile polyp measuring 4 mm in size was found at the cecum.  A polypectomy was performed with cold forceps.  The resection was complete, the polyp tissue was completely retrieved and sent to histology.   Two sessile polyps measuring 5 mm in size were found in the transverse colon and descending colon. Polypectomies were  performed with a cold snare.  The resection was complete, the polyp tissue was completely retrieved and sent to histology.   There was mild diverticulosis noted in the right colon.  Retroflexed views revealed internal hemorrhoids. The time to cecum = 5.7 Withdrawal time = 13.2   The scope was withdrawn and the procedure completed. COMPLICATIONS: There were no immediate complications.  ENDOSCOPIC IMPRESSION: 1.   Sessile polyp was found at the cecum; polypectomy was performed with cold forceps 2.   Two sessile polyps were found in the transverse colon and descending colon; polypectomies were performed with a cold snare 3.   Mild diverticulosis was noted in the right colon  RECOMMENDATIONS: 1.  Await pathology results 2.  Timing of repeat colonoscopy will be determined by pathology findings. 3.  You will receive a letter within 1-2 weeks with the results of your biopsy as well as final recommendations.  Please call my office if you have not received a letter after 3 weeks. eSigned:  Jerene Bears, MD 02/17/2015 3:12 PM reviecc:  the patient, Dr. Deborra Medina

## 2015-02-18 ENCOUNTER — Telehealth: Payer: Self-pay | Admitting: *Deleted

## 2015-02-18 NOTE — Telephone Encounter (Signed)
  Follow up Call-  Call back number 02/17/2015  Post procedure Call Back phone  # [phone]  Permission to leave phone message Yes     Patient questions:  Do you have a fever, pain , or abdominal swelling? Yes.   Pain Score  3 *  Have you tolerated food without any problems? Yes.    Have you been able to return to your normal activities? Yes.    Do you have any questions about your discharge instructions: Diet   Yes.   Medications  No. Follow up visit  No.  Do you have questions or concerns about your Care? Yes.    Actions: * If pain score is 4 or above: No action needed, pain <4.

## 2015-02-19 ENCOUNTER — Other Ambulatory Visit: Payer: Self-pay | Admitting: Primary Care

## 2015-02-20 ENCOUNTER — Encounter: Payer: Self-pay | Admitting: Internal Medicine

## 2015-02-20 NOTE — Telephone Encounter (Signed)
Electronically refill request for   predniSONE (DELTASONE) 10 MG tablet   Take 2 tablets for 3 days, then 1 tablet for 2 days.  Dispense: 8 tablet   Refills: 0     Last prescribed for an acute visit on 02/13/2015.

## 2015-02-21 ENCOUNTER — Other Ambulatory Visit: Payer: Self-pay

## 2015-02-21 DIAGNOSIS — R21 Rash and other nonspecific skin eruption: Secondary | ICD-10-CM

## 2015-02-21 NOTE — Telephone Encounter (Signed)
Pt left v/m; pt just had colonoscopy and cannot come to office for appt and pt request refill of prednisone until can come in to office next week for appt due to pt itching badly today. Pt last seen and Prednisone last refilled # 8 on 02/13/15.[facility]. Pt request cb.No future appt scheduled.

## 2015-02-24 ENCOUNTER — Encounter: Payer: Self-pay | Admitting: Primary Care

## 2015-02-24 ENCOUNTER — Ambulatory Visit (INDEPENDENT_AMBULATORY_CARE_PROVIDER_SITE_OTHER): Payer: Medicare Other | Admitting: Primary Care

## 2015-02-24 VITALS — BP 118/74 | HR 110 | Temp 98.1°F | Ht 66.0 in | Wt 198.8 lb

## 2015-02-24 DIAGNOSIS — L299 Pruritus, unspecified: Secondary | ICD-10-CM | POA: Diagnosis not present

## 2015-02-24 MED ORDER — HYDROXYZINE HCL 25 MG PO TABS: 25.0000 mg | ORAL_TABLET | Freq: Three times a day (TID) | ORAL | Status: DC | PRN

## 2015-02-24 NOTE — Progress Notes (Signed)
Subjective:    Patient ID: [name], male    DOB: [birth year], 70 y.o.   MRN: [id number]  HPI  [name] is a 70 year old male who presents today with a chief complaint of itching. He was evaluated on 02/13/15 with same complaints. No obvious etiology of itching identified last visit (negative for scabies, bed bugs, allergic reaction, contact dermatitis), but was provided with a course of prednisone for itching.  Since his last visit the itching improved temporarily for about 4 days. He's been applying OTC lotion to his dry skin with some improvement. No one in his house is itching. He's itching to his bilateral upper and lower extremities, and to his abdomen. His itching has been bothersome for 2 months intermittently, with constant itching over the past month. He's caused abrasions to his chest wall and arms from scratching.   Review of Systems  HENT: Negative for postnasal drip and rhinorrhea.   Skin: Negative for color change and rash.       Abrasions and itching.  Allergic/Immunologic: Negative for environmental allergies.       Past Medical History  Diagnosis Date  . HLD (hyperlipidemia) 5/99  . HTN (hypertension) 5/99  . BPH (benign prostatic hypertrophy) 5/99  . GERD (gastroesophageal reflux disease) 07/31/04    gastritis   . Anxiety   . Allergy     pollen/ragweed  . Adenomatous polyp of colon 2006  . Hearing difficulty   . Humerus fracture 11/02/2011    LUE  . Peripheral vascular disease ([place])   . Pneumonia 1996    hosp  . Chronic bronchitis ([place])     "q year or q other year" (11/02/2011)  . History of blood transfusion 2012  . Anemia 2012  . Hernia, umbilical     "unrepaired" (11/02/2011)  . H/O hiatal hernia   . Memory loss     "recently has been forgetting alot; maybe related to the alcohol" (11/02/2011)  . Falls frequently     "daily lately; legs just give out" (11/02/2011)  . H/O nausea   . Depression     Hx of  . Shortness of breath     with  exertion  . Seasonal allergies   . Colon polyp     Social History   Social History  . Marital Status: Married    Spouse Name: N/A  . Number of Children: 3  . Years of Education: N/A   Occupational History  . color matcher    Social History Main Topics  . Smoking status: Never Smoker   . Smokeless tobacco: Never Used  . Alcohol Use: 1.2 oz/week    2 Glasses of wine per week     Comment: 11/02/2011 "large box of wine q 2 days" 01-27-15 2 glass a day of wine  . Drug Use: No  . Sexual Activity: No   Other Topics Concern  . Not on file   Social History Narrative   Married (remarried), lives with wife; 3 children, 1 at home.    [employer] - mixes colors       Pt revised designated party release form [name], wife [phone] - can leave msg.     [name] 03/04/10.        Would desire CPR.  Would not want prolonged life support if futile.    Past Surgical History  Procedure Laterality Date  . L duputryen contractive surgery    . Neck mri  6/04  C5-6 disc abnormality  . Colonoscopy  07/31/04    multiple polyps; repeat in 3 years  . Hosp cp r/o'd 2/24  03/08/07  . Ett myoview  03/09/07    nml EF 66%  . Upper gastrointestinal endoscopy    . Cataract extraction w/ intraocular lens  implant, bilateral  10/2002  . Tonsillectomy      "I was young" (11/02/2011)  . Hemorrhoid surgery      "cauterized a long time ago" (11/02/2011)  . Esophageal varice ligation  2012  . Orif humerus fracture  11/04/2011    Procedure: OPEN REDUCTION INTERNAL FIXATION (ORIF) PROXIMAL HUMERUS FRACTURE;  Surgeon: Rozanna Box, MD;  Location: [facility];  Service: Orthopedics;  Laterality: Left;  . Humerus fracture surgery Left 11/04/11    with plate to the shoulder  . Posterior cervical fusion/foraminotomy N/A 03/21/2012    Procedure: POSTERIOR CERVICAL FUSION/FORAMINOTOMY LEVEL ONE;  Surgeon: Charlie Pitter, MD;  Location: [place];  Service: Neurosurgery;  Laterality: N/A;   POSTERIOR CERVICAL ONE TO CERVICAL TWO FUSION WITH ILIAC CREST GRAFT AND LATERAL MASS SCREWS     Family History  Problem Relation Age of Onset  . Heart disease Father   . Diabetes Father   . Stroke Father   . Depression Mother   . Heart disease Brother   . Alcohol abuse Brother   . Liver disease Brother     Allergies  Allergen Reactions  . Bee Venom Swelling  . Nifedipine     REACTION: SWELLING 11/02/2011 pt denies this allergy.    Current Outpatient Prescriptions on File Prior to Visit  Medication Sig Dispense Refill  . B Complex Vitamins (VITAMIN B COMPLEX PO) Take by mouth.    . cyclobenzaprine (FLEXERIL) 10 MG tablet Take 1 tablet (10 mg total) by mouth daily as needed for muscle spasms. 90 tablet 1  . EPINEPHrine (EPIPEN 2-PAK) 0.3 mg/0.3 mL IJ SOAJ injection INJECT IN TO THE MUSCLE AS DIRECTED FOR SEVERE ALLERGICE REACTION 2 Device 1  . Magnesium 500 MG TABS Take 500 mg by mouth daily.     . methocarbamol (ROBAXIN) 500 MG tablet Take 500 mg by mouth 4 (four) times daily. Reported on 02/17/2015    . promethazine (PHENERGAN) 25 MG tablet TAKE 1 TABLET DAILY AS NEEDED FOR NAUSEA 90 tablet 0  . QUEtiapine (SEROQUEL) 300 MG tablet TAKE 1 TABLET AT BEDTIME 90 tablet 2  . spironolactone (ALDACTONE) 50 MG tablet Take 1 tablet (50 mg total) by mouth daily. 90 tablet 3  . Thiamine HCl (VITAMIN B-1) 100 MG tablet Take 100 mg by mouth daily. Reported on 02/17/2015    . traZODone (DESYREL) 50 MG tablet TAKE 1 TABLET BY MOUTH AT BEDTIME 30 tablet 1  . zolpidem (AMBIEN) 5 MG tablet Take 1 tablet (5 mg total) by mouth at bedtime as needed for sleep. (Patient not taking: Reported on 02/17/2015) 90 tablet 0   No current facility-administered medications on file prior to visit.    BP 118/74 mmHg  Pulse 110  Temp(Src) 98.1 F (36.7 C) (Oral)  Ht 5\' 6"  (1.676 m)  Wt 198 lb 12.8 oz (90.175 kg)  BMI 32.10 kg/m2  SpO2 96%    Objective:   Physical Exam  Constitutional: He appears  well-nourished.  Cardiovascular: Normal rate and regular rhythm.   Pulmonary/Chest: Effort normal and breath sounds normal. He has no rales.  Skin: Skin is warm.  Dry skin, mild flaking. Abrasions to left anterior chest wall from  scratching. No evidence of bed bugs or scabies. No visible rash.          Assessment & Plan:  Puritis:  Present for previous 2 months, moreso bothersome over past month. Last visit provided with IM steroid injection and short burst of prednisone. Temporary improvement. No visible rash noted upon either visit. No clear etiology of cause for puritis except for dry skin. Will treat with moisturizers OTC. Also provided short term script for hydroxyzine to use PRN. Drowsiness precautions provided. He is to notify us if no improvement. Will consider derm eval if no improvement.

## 2015-02-24 NOTE — Patient Instructions (Signed)
Start Hydroxyzine 25 mg tablets. Take 1 tablet by mouth three times daily as needed for itching.   Start taking Zyrtec antihistamine at bedtime every night. This will also help with itching if it's caused by allergies.   Please follow up with PCP if no improvement in itching.  It was a pleasure to see you today!

## 2015-02-24 NOTE — Progress Notes (Signed)
Pre visit review using our clinic review tool, if applicable. No additional management support is needed unless otherwise documented below in the visit note. 

## 2015-03-02 ENCOUNTER — Other Ambulatory Visit: Payer: Self-pay | Admitting: Family Medicine

## 2015-03-03 NOTE — Telephone Encounter (Signed)
Received refill request electronically Last office visit 02/24/15/acute Medication is no longer on medication list Is it okay to refill as requested?

## 2015-03-03 NOTE — Telephone Encounter (Signed)
He is being followed by GI.  Would be better pt care for this request to go to GI.  Was just seen by them last week.

## 2015-03-04 NOTE — Telephone Encounter (Signed)
Forwarding to pyrtle

## 2015-03-06 NOTE — Telephone Encounter (Signed)
Continue PPI once daily

## 2015-03-07 ENCOUNTER — Other Ambulatory Visit: Payer: Self-pay | Admitting: Primary Care

## 2015-03-07 DIAGNOSIS — L299 Pruritus, unspecified: Secondary | ICD-10-CM

## 2015-03-07 NOTE — Telephone Encounter (Signed)
Electronically refill request for   hydrOXYzine (ATARAX/VISTARIL) 25 MG tablet   Take 1 tablet (25 mg total) by mouth 3 (three) times daily as needed for itching.  Dispense: 30 tablet   Refills: 0     Last prescribed and seen on 02/24/2015. Follow up with Dr Deborra Medina on 03/10/2015.

## 2015-03-10 ENCOUNTER — Ambulatory Visit (INDEPENDENT_AMBULATORY_CARE_PROVIDER_SITE_OTHER): Payer: Medicare Other | Admitting: Family Medicine

## 2015-03-10 ENCOUNTER — Encounter: Payer: Self-pay | Admitting: Family Medicine

## 2015-03-10 VITALS — BP 134/72 | HR 119 | Temp 98.0°F | Wt 197.5 lb

## 2015-03-10 DIAGNOSIS — I499 Cardiac arrhythmia, unspecified: Secondary | ICD-10-CM | POA: Diagnosis not present

## 2015-03-10 DIAGNOSIS — I471 Supraventricular tachycardia: Secondary | ICD-10-CM | POA: Insufficient documentation

## 2015-03-10 DIAGNOSIS — L299 Pruritus, unspecified: Secondary | ICD-10-CM

## 2015-03-10 DIAGNOSIS — I4891 Unspecified atrial fibrillation: Secondary | ICD-10-CM | POA: Diagnosis not present

## 2015-03-10 LAB — COMPREHENSIVE METABOLIC PANEL
ALBUMIN: 3.5 g/dL (ref 3.5–5.2)
ALK PHOS: 69 U/L (ref 39–117)
ALT: 14 U/L (ref 0–53)
AST: 22 U/L (ref 0–37)
BUN: 8 mg/dL (ref 6–23)
CALCIUM: 8.7 mg/dL (ref 8.4–10.5)
CHLORIDE: 99 meq/L (ref 96–112)
CO2: 23 mEq/L (ref 19–32)
CREATININE: 1.06 mg/dL (ref 0.40–1.50)
GFR: 73.72 mL/min (ref >60.00)
Glucose, Bld: 109 mg/dL — ABNORMAL HIGH (ref 70–99)
POTASSIUM: 4.3 meq/L (ref 3.5–5.1)
SODIUM: 136 meq/L (ref 135–145)
TOTAL PROTEIN: 6.7 g/dL (ref 6.0–8.3)
Total Bilirubin: 0.6 mg/dL (ref 0.2–1.2)

## 2015-03-10 MED ORDER — HYDROXYZINE HCL 25 MG PO TABS: ORAL_TABLET | ORAL | Status: DC

## 2015-03-10 NOTE — Assessment & Plan Note (Signed)
Improving a little with hydroxizine. Skin does seem dry- continue OTC moisturizers. Check ammonia level today given h/o ETOH abuse. Refer to derm. The patient indicates understanding of these issues and agrees with the plan.

## 2015-03-10 NOTE — Progress Notes (Signed)
Subjective:   Patient ID: [name], male    DOB: Feb 17, 1945, 70 y.o.   MRN: [id number]  FILIP TRIMMER is a pleasant 70 y.o. year old male who presents to clinic today with Pruritis  on 03/10/2015  HPI:  Has been seen twice for this complaint by my partner, Owens Shark- on 02/13/15 and 02/24/15.  Notes reviewed.  Initially felt ? Due to bug bites.  Given course of prednisone.  Had minor relief for a few days, then returned.   Advised OTC moisturizers for dry skin and given course of hydroxizine with some improvement. This has now been ongoing for months.  No changes in soaps or detergents.  Lab Results  Component Value Date   ALT 18 01/27/2015   AST 32 01/27/2015   ALKPHOS 144* 01/27/2015   BILITOT 0.7 01/27/2015   Of note, his heart rate is elevated today and sounds irregular on exam.  He does report some dizziness.  No CP or SOB.   Current Outpatient Prescriptions on File Prior to Visit  Medication Sig Dispense Refill  . B Complex Vitamins (VITAMIN B COMPLEX PO) Take by mouth.    . cyclobenzaprine (FLEXERIL) 10 MG tablet Take 1 tablet (10 mg total) by mouth daily as needed for muscle spasms. 90 tablet 1  . EPINEPHrine (EPIPEN 2-PAK) 0.3 mg/0.3 mL IJ SOAJ injection INJECT IN TO THE MUSCLE AS DIRECTED FOR SEVERE ALLERGICE REACTION 2 Device 1  . Magnesium 500 MG TABS Take 500 mg by mouth daily.     . methocarbamol (ROBAXIN) 500 MG tablet Take 500 mg by mouth 4 (four) times daily. Reported on 02/17/2015    . pantoprazole (PROTONIX) 40 MG tablet TAKE 1 TABLET TWICE A DAY 180 tablet 2  . promethazine (PHENERGAN) 25 MG tablet TAKE 1 TABLET DAILY AS NEEDED FOR NAUSEA 90 tablet 0  . QUEtiapine (SEROQUEL) 300 MG tablet TAKE 1 TABLET AT BEDTIME 90 tablet 2  . spironolactone (ALDACTONE) 50 MG tablet Take 1 tablet (50 mg total) by mouth daily. 90 tablet 3  . Thiamine HCl (VITAMIN B-1) 100 MG tablet Take 100 mg by mouth daily. Reported on 02/17/2015    . traZODone (DESYREL) 50 MG tablet TAKE  1 TABLET BY MOUTH AT BEDTIME 30 tablet 1  . zolpidem (AMBIEN) 5 MG tablet Take 1 tablet (5 mg total) by mouth at bedtime as needed for sleep. 90 tablet 0   No current facility-administered medications on file prior to visit.    Allergies  Allergen Reactions  . Bee Venom Swelling  . Nifedipine     REACTION: SWELLING 11/02/2011 pt denies this allergy.    Past Medical History  Diagnosis Date  . HLD (hyperlipidemia) 5/99  . HTN (hypertension) 5/99  . BPH (benign prostatic hypertrophy) 5/99  . GERD (gastroesophageal reflux disease) 07/31/04    gastritis   . Anxiety   . Allergy     pollen/ragweed  . Adenomatous polyp of colon 2006  . Hearing difficulty   . Humerus fracture 11/02/2011    LUE  . Peripheral vascular disease ([place])   . Pneumonia 1996    hosp  . Chronic bronchitis ([place])     "q year or q other year" (11/02/2011)  . History of blood transfusion 2012  . Anemia 2012  . Hernia, umbilical     "unrepaired" (11/02/2011)  . H/O hiatal hernia   . Memory loss     "recently has been forgetting alot; maybe related to the alcohol" (11/02/2011)  .  Falls frequently     "daily lately; legs just give out" (11/02/2011)  . H/O nausea   . Depression     Hx of  . Shortness of breath     with exertion  . Seasonal allergies   . Colon polyp     Past Surgical History  Procedure Laterality Date  . L duputryen contractive surgery    . Neck mri  6/04    C5-6 disc abnormality  . Colonoscopy  07/31/04    multiple polyps; repeat in 3 years  . Hosp cp r/o'd 2/24  03/08/07  . Ett myoview  03/09/07    nml EF 66%  . Upper gastrointestinal endoscopy    . Cataract extraction w/ intraocular lens  implant, bilateral  10/2002  . Tonsillectomy      "I was young" (11/02/2011)  . Hemorrhoid surgery      "cauterized a long time ago" (11/02/2011)  . Esophageal varice ligation  2012  . Orif humerus fracture  11/04/2011    Procedure: OPEN REDUCTION INTERNAL FIXATION (ORIF) PROXIMAL HUMERUS  FRACTURE;  Surgeon: Rozanna Box, MD;  Location: [facility];  Service: Orthopedics;  Laterality: Left;  . Humerus fracture surgery Left 11/04/11    with plate to the shoulder  . Posterior cervical fusion/foraminotomy N/A 03/21/2012    Procedure: POSTERIOR CERVICAL FUSION/FORAMINOTOMY LEVEL ONE;  Surgeon: Charlie Pitter, MD;  Location: [place] [facility];  Service: Neurosurgery;  Laterality: N/A;  POSTERIOR CERVICAL ONE TO CERVICAL TWO FUSION WITH ILIAC CREST GRAFT AND LATERAL MASS SCREWS     Family History  Problem Relation Age of Onset  . Heart disease Father   . Diabetes Father   . Stroke Father   . Depression Mother   . Heart disease Brother   . Alcohol abuse Brother   . Liver disease Brother     Social History   Social History  . Marital Status: Married    Spouse Name: N/A  . Number of Children: 3  . Years of Education: N/A   Occupational History  . color matcher    Social History Main Topics  . Smoking status: Never Smoker   . Smokeless tobacco: Never Used  . Alcohol Use: 1.2 oz/week    2 Glasses of wine per week     Comment: 11/02/2011 "large box of wine q 2 days" 01-27-15 2 glass a day of wine  . Drug Use: No  . Sexual Activity: No   Other Topics Concern  . Not on file   Social History Narrative   Married (remarried), lives with wife; 3 children, 1 at home.    [employer] - mixes colors       Pt revised designated party release form Omarii Meade, wife [phone] - can leave msg.     Allen Norris 03/04/10.        Would desire CPR.  Would not want prolonged life support if futile.   The PMH, PSH, Social History, Family History, Medications, and allergies have been reviewed in [facility], and have been updated if relevant.   Review of Systems  Constitutional: Negative.   Respiratory: Negative.   Cardiovascular: Negative.   Gastrointestinal: Negative.   Skin:       Itching- arms, legs, back  Neurological: Positive for dizziness. Negative for tremors, seizures,  syncope, facial asymmetry, speech difficulty, weakness, light-headedness, numbness and headaches.  Psychiatric/Behavioral: Negative.   All other systems reviewed and are negative.      Objective:  BP 134/72 mmHg  Pulse 119  Temp(Src) 98 F (36.7 C) (Oral)  Wt 197 lb 8 oz (89.585 kg)  SpO2 98%   Physical Exam  Constitutional: He appears well-nourished.  HEENT:  normocephalic Cardiovascular: irregularly irregular Pulmonary/Chest: Effort normal and breath sounds normal. He has no rales.  Skin: Skin is warm.  Dry skin, mild flaking. Abrasions to left anterior chest wall from scratching. No evidence of bed bugs or scabies. No visible rash MSK:  Normal gait No LE edema Psych:  Good eye contact, not anxious or depressed appearing     Assessment & Plan:   Itching - Plan: Ammonia, Comprehensive metabolic panel, hydrOXYzine (ATARAX/VISTARIL) 25 MG tablet, Ambulatory referral to Dermatology  Atrial fibrillation, unspecified No Follow-up on file.

## 2015-03-10 NOTE — Assessment & Plan Note (Signed)
New- initially felt this sounded like afib on exam.  EKG showed sinus tach with PACs. Will refer to cardiology for further evaluation since he is having some dizziness. The patient indicates understanding of these issues and agrees with the plan.

## 2015-03-10 NOTE — Addendum Note (Signed)
Addended by: Marchia Bond on: 03/10/2015 09:45 AM   Modules accepted: Orders

## 2015-03-10 NOTE — Progress Notes (Signed)
Pre visit review using our clinic review tool, if applicable. No additional management support is needed unless otherwise documented below in the visit note. 

## 2015-03-10 NOTE — Patient Instructions (Addendum)
Good to see you. Please stop by to see [name] on your way out. We will call you with your lab results.   

## 2015-03-11 LAB — AMMONIA: AMMONIA: 87 umol/L — AB (ref 16–53)

## 2015-03-12 DIAGNOSIS — L309 Dermatitis, unspecified: Secondary | ICD-10-CM | POA: Diagnosis not present

## 2015-03-24 ENCOUNTER — Ambulatory Visit (INDEPENDENT_AMBULATORY_CARE_PROVIDER_SITE_OTHER): Payer: Medicare Other | Admitting: Family Medicine

## 2015-03-24 ENCOUNTER — Other Ambulatory Visit: Payer: Self-pay

## 2015-03-24 ENCOUNTER — Encounter: Payer: Self-pay | Admitting: Family Medicine

## 2015-03-24 ENCOUNTER — Telehealth: Payer: Self-pay | Admitting: Family Medicine

## 2015-03-24 VITALS — BP 122/70 | HR 97 | Temp 98.0°F | Ht 65.35 in | Wt 195.2 lb

## 2015-03-24 DIAGNOSIS — M25512 Pain in left shoulder: Secondary | ICD-10-CM

## 2015-03-24 DIAGNOSIS — S42202S Unspecified fracture of upper end of left humerus, sequela: Secondary | ICD-10-CM

## 2015-03-24 MED ORDER — TRAZODONE HCL 50 MG PO TABS: 50.0000 mg | ORAL_TABLET | Freq: Every day | ORAL | Status: DC

## 2015-03-24 MED ORDER — METHYLPREDNISOLONE ACETATE 40 MG/ML IJ SUSP
80.0000 mg | Freq: Once | INTRAMUSCULAR | Status: AC
  Administered 2015-03-24: 80 mg via INTRA_ARTICULAR

## 2015-03-24 MED ORDER — PROMETHAZINE HCL 25 MG PO TABS: ORAL_TABLET | ORAL | Status: DC

## 2015-03-24 NOTE — Telephone Encounter (Signed)
Trazodone resent electronically

## 2015-03-24 NOTE — Telephone Encounter (Signed)
Pt left note that when pt was seen 03/10/15 pt thought promethazine (last refilled # 90 on 11/05/14) and trazodone(last refilled # 30 x 1 on 01/30/15) [facility]. Pt request [facility]. Last annual exam on 10/24/14; FYI to Dr Deborra Medina that pt saw Dr [name] Crane was prescribed triamicnolone cream 0.1% and it worked.

## 2015-03-24 NOTE — Progress Notes (Signed)
Dr. Frederico Hamman T. Clella Mckeel, MD, [facility] [place], [address] Phone: [phone] Fax: [phone]  03/24/2015  Patient: [name], MRN: [id number], DOB: 1945-09-12, 70 y.o.  Primary Physician:  [name], MD  Chief Complaint: Shoulder Pain  Subjective:   [name] is a 70 y.o. very pleasant male patient who presents with the following:  Complex trauma history noted below. Ongoing left shoulder pain, status post left proximal humerus fracture with ORIF on [id number] and other complications including a neck fracture managed by neurosurgery.  Again, as previously discussed with the patient, there are approximately 2) screws in the glenohumeral joint space and patient does have notable glenohumeral arthritis also. Patient has significant limitation in motion.  He did not follow up with my recommendation for following up with his treating traumatologist.  03/28/2014 Last OV with Owens Loffler, MD  Complex history is again noted below.  Complex trauma.  Hardware remains in place.  When I saw him in September of last year, recommended follow-up for consideration of removal of hardware.  I did an intra-articular injection for pain relief, and this did give him some significant relief for 4 or 5 months.  His motion is still limited in his strength is still not great.  The patient also has been sick for about 2 months, with intermittent chills, coughing quite a bit, runny nose and some facial pain.  He is not having any earache, no nausea, vomiting, or diarrhea.  In this time period, he has never fully recovered.  10/10/2013 Last OV with Owens Loffler, MD  Patient presents post complex L proximal humerus fracture including the tuberosities on 11/04/2011 with ORIF by Dr. Marcelino Scot with subequent complication, neck fracture, c1/2 fusion by Dr. Annette Stable for an odontoid fracture.  Consultation is requested by my partner for any kind  of recommendation that I can make for the patient for function and pain management.   On reviewing the old films and new films, the patient has a least 2 screws that are approaching the glenohumeral joint space, and there in the glenohumeral joint space. There is also one that is not flush any longer.  The patient has significant pain and limitation in his motion in that LEFT shoulder.  He is limited somewhat in what he can take for paingiven that he is a chronic alcoholic with cirrhosis, and he also has had problems with ulcers and bleeding secondary to NSAIDs. Currently he is using a lidocaine patch sometimes.  Past Medical History, Surgical History, Social History, Family History, Problem List, Medications, and Allergies have been reviewed and updated if relevant.  GEN: No fevers, chills. Nontoxic. Primarily MSK c/o today. MSK: Detailed in the HPI GI: tolerating PO intake without difficulty Neuro: No numbness, parasthesias, or tingling associated. Otherwise the pertinent positives of the ROS are noted above.   Objective:   BP 122/70 mmHg  Pulse 97  Temp(Src) 98 F (36.7 C) (Oral)  Ht 5' 5.35" (1.66 m)  Wt 195 lb 4 oz (88.565 kg)  BMI 32.14 kg/m2   GEN: WDWN, NAD, Non-toxic, Alert & Oriented x 3 HEENT: Atraumatic, Normocephalic.  Ears and Nose: No external deformity. EXTR: No clubbing/cyanosis/edema NEURO: Normal gait.  PSYCH: Normally interactive. Conversant. Not depressed or anxious appearing.  Calm demeanor.    Patient LEFT shoulder is nontender to palpation along the clavicle. Nontender at the a.c. Joint. He is mildly tender in the bicipital groove. There is a prominentscar.  Range of motion is notably limited. The patient can abduct his shoulder to 100actively. He can flex his shoulder to 105 actively. Internal and external rotation is fairly limited.  Strength testing is 4+ to 5/5 in all directions. No particular deficits.  Given his loss of motion, special testing  not attempted.  Radiology: Dg Shoulder Left  10/10/2013   CLINICAL DATA:  Left shoulder pain.  EXAM: LEFT SHOULDER - 2+ VIEW  COMPARISON:  01/09/2013 .  FINDINGS: Postsurgical changes noted proximal left humerus, unchanged in appearance. Degenerative changes noted of the acromioclavicular and glenohumeral joints. No evidence of a fracture, dislocation, or separation P  IMPRESSION: 1. Stable postsurgical changes left humerus. 2. DJD, no acute abnormality .   Electronically Signed   By: Marcello Moores  Register   On: 10/10/2013 10:14    Assessment and Plan:   Proximal humeral fracture, left, sequela - Plan: Ambulatory referral to Orthopedic Surgery  Left shoulder pain - Plan: Ambulatory referral to Orthopedic Surgery, methylPREDNISolone acetate (DEPO-MEDROL) injection 80 mg  My recommendations to the patient today were basically exactly the same as they were last time.  I think that he needs to discuss his shoulder with Dr. Marcelino Scot.  It appears as if one of the surgical screws is not flush any longer with the plate, and it also looks as if there is some hardware in the intra-articular space.  We will formally consult Dr. Marcelino Scot.  Intervally, for pain control, he asked me to do an intra-articular injection.  I think this is reasonable given his level of dysfunction.  Intrarticular Shoulder Injection, LEFT Verbal consent was obtained from the patient. Risks including infection explained and contrasted with benefits and alternatives. Patient prepped with Chloraprep and Ethyl Chloride used for anesthesia. An intraarticular shoulder injection was performed using the posterior approach. The patient tolerated the procedure well and had decreased pain post injection. No complications. Injection: 8 cc of Lidocaine 1% and Depo-Medrol 80 mg. Needle: 22 gauge   Orders Placed This Encounter  Procedures  . Ambulatory referral to Orthopedic Surgery    Signed,  Frederico Hamman T. Hadassah Rana, MD   Patient's Medications  New  Prescriptions   No medications on file  Previous Medications   B COMPLEX VITAMINS (VITAMIN B COMPLEX PO)    Take by mouth.   CYCLOBENZAPRINE (FLEXERIL) 10 MG TABLET    Take 1 tablet (10 mg total) by mouth daily as needed for muscle spasms.   EPINEPHRINE (EPIPEN 2-PAK) 0.3 MG/0.3 ML IJ SOAJ INJECTION    INJECT IN TO THE MUSCLE AS DIRECTED FOR SEVERE ALLERGICE REACTION   HYDROXYZINE (ATARAX/VISTARIL) 25 MG TABLET    TAKE 1 TABLET(25 MG) BY MOUTH THREE TIMES DAILY AS NEEDED FOR ITCHING   MAGNESIUM 500 MG TABS    Take 500 mg by mouth daily.    METHOCARBAMOL (ROBAXIN) 500 MG TABLET    Take 500 mg by mouth 4 (four) times daily. Reported on 02/17/2015   PANTOPRAZOLE (PROTONIX) 40 MG TABLET    TAKE 1 TABLET TWICE A DAY   QUETIAPINE (SEROQUEL) 300 MG TABLET    TAKE 1 TABLET AT BEDTIME   SPIRONOLACTONE (ALDACTONE) 50 MG TABLET    Take 1 tablet (50 mg total) by mouth daily.   THIAMINE HCL (VITAMIN B-1) 100 MG TABLET    Take 100 mg by mouth daily. Reported on 02/17/2015   TRIAMCINOLONE CREAM (KENALOG) 0.1 %       ZOLPIDEM (AMBIEN) 5 MG TABLET    Take 1 tablet (5 mg  total) by mouth at bedtime as needed for sleep.  Modified Medications   Modified Medication Previous Medication   PROMETHAZINE (PHENERGAN) 25 MG TABLET promethazine (PHENERGAN) 25 MG tablet      TAKE 1 TABLET DAILY AS NEEDED FOR NAUSEA    TAKE 1 TABLET DAILY AS NEEDED FOR NAUSEA   TRAZODONE (DESYREL) 50 MG TABLET traZODone (DESYREL) 50 MG tablet      Take 1 tablet (50 mg total) by mouth at bedtime.    TAKE 1 TABLET BY MOUTH AT BEDTIME  Discontinued Medications   No medications on file

## 2015-03-24 NOTE — Patient Instructions (Signed)

## 2015-03-24 NOTE — Addendum Note (Signed)
Addended by: Modena Nunnery on: 03/24/2015 02:05 PM   Modules accepted: Orders

## 2015-03-24 NOTE — Telephone Encounter (Signed)
error 

## 2015-03-24 NOTE — Progress Notes (Signed)
Pre visit review using our clinic review tool, if applicable. No additional management support is needed unless otherwise documented below in the visit note. 

## 2015-03-27 DIAGNOSIS — H52223 Regular astigmatism, bilateral: Secondary | ICD-10-CM | POA: Diagnosis not present

## 2015-03-27 DIAGNOSIS — H01009 Unspecified blepharitis unspecified eye, unspecified eyelid: Secondary | ICD-10-CM | POA: Diagnosis not present

## 2015-03-27 DIAGNOSIS — Z9841 Cataract extraction status, right eye: Secondary | ICD-10-CM | POA: Diagnosis not present

## 2015-03-27 DIAGNOSIS — Z9842 Cataract extraction status, left eye: Secondary | ICD-10-CM | POA: Diagnosis not present

## 2015-03-27 DIAGNOSIS — H353132 Nonexudative age-related macular degeneration, bilateral, intermediate dry stage: Secondary | ICD-10-CM | POA: Diagnosis not present

## 2015-04-02 ENCOUNTER — Other Ambulatory Visit: Payer: Self-pay | Admitting: Family Medicine

## 2015-04-02 NOTE — Telephone Encounter (Signed)
Should pt be taking both seroquel and ambien?

## 2015-04-07 ENCOUNTER — Telehealth (HOSPITAL_COMMUNITY): Payer: Self-pay | Admitting: *Deleted

## 2015-04-07 ENCOUNTER — Ambulatory Visit (INDEPENDENT_AMBULATORY_CARE_PROVIDER_SITE_OTHER): Payer: Medicare Other | Admitting: Cardiology

## 2015-04-07 ENCOUNTER — Encounter: Payer: Self-pay | Admitting: Cardiology

## 2015-04-07 VITALS — BP 126/84 | HR 62 | Ht 69.0 in | Wt 193.8 lb

## 2015-04-07 DIAGNOSIS — I491 Atrial premature depolarization: Secondary | ICD-10-CM | POA: Diagnosis not present

## 2015-04-07 DIAGNOSIS — I10 Essential (primary) hypertension: Secondary | ICD-10-CM

## 2015-04-07 DIAGNOSIS — I4719 Other supraventricular tachycardia: Secondary | ICD-10-CM

## 2015-04-07 DIAGNOSIS — R0609 Other forms of dyspnea: Secondary | ICD-10-CM | POA: Diagnosis not present

## 2015-04-07 DIAGNOSIS — R000 Tachycardia, unspecified: Secondary | ICD-10-CM

## 2015-04-07 DIAGNOSIS — I471 Supraventricular tachycardia: Secondary | ICD-10-CM

## 2015-04-07 HISTORY — DX: Atrial premature depolarization: I49.1

## 2015-04-07 HISTORY — DX: Other supraventricular tachycardia: I47.19

## 2015-04-07 HISTORY — DX: Supraventricular tachycardia: I47.1

## 2015-04-07 NOTE — Progress Notes (Signed)
Cardiology Office Note   Date:  04/07/2015   ID:  [name], DOB Dec 02, 1945, MRN [id number]  PCP:  Sean Norris, MD    No chief complaint on file.     History of Present Illness: [name] is a 70 y.o. male who presents for evaluation of tachycardia.  He was seen by his PCP in February and was noted to have an elevated HR.  EKG was done which showed sinus tachycardia with PAC's.  He says that he is not aware of the extra heart beats and denies any palpitations.  He denies chest pain or pressure.  He admits to DOE when going up and down the stairs.  He denies any dizziness except when getting out of his recliner too fast.  He denies any syncope.  He denies any LE edema or claudications symptoms.  He denies any PND, orthopnea.  .      Past Medical History  Diagnosis Date  . HLD (hyperlipidemia) 5/99  . HTN (hypertension) 5/99  . BPH (benign prostatic hypertrophy) 5/99  . GERD (gastroesophageal reflux disease) 07/31/04    gastritis   . Anxiety   . Allergy     pollen/ragweed  . Adenomatous polyp of colon 2006  . Hearing difficulty   . Humerus fracture 11/02/2011    LUE  . Peripheral vascular disease ([place])   . Pneumonia 1996    hosp  . Chronic bronchitis ([place])     "q year or q other year" (11/02/2011)  . History of blood transfusion 2012  . Anemia 2012  . Hernia, umbilical     "unrepaired" (11/02/2011)  . H/O hiatal hernia   . Memory loss     "recently has been forgetting alot; maybe related to the alcohol" (11/02/2011)  . Falls frequently     "daily lately; legs just give out" (11/02/2011)  . H/O nausea   . Depression     Hx of  . Shortness of breath     with exertion  . Seasonal allergies   . Colon polyp     Past Surgical History  Procedure Laterality Date  . L duputryen contractive surgery    . Neck mri  6/04    C5-6 disc abnormality  . Colonoscopy  07/31/04    multiple polyps; repeat in 3 years  . Hosp cp r/o'd 2/24  03/08/07  .  Ett myoview  03/09/07    nml EF 66%  . Upper gastrointestinal endoscopy    . Cataract extraction w/ intraocular lens  implant, bilateral  10/2002  . Tonsillectomy      "I was young" (11/02/2011)  . Hemorrhoid surgery      "cauterized a long time ago" (11/02/2011)  . Esophageal varice ligation  2012  . Orif humerus fracture  11/04/2011    Procedure: OPEN REDUCTION INTERNAL FIXATION (ORIF) PROXIMAL HUMERUS FRACTURE;  Surgeon: Rozanna Box, MD;  Location: [facility];  Service: Orthopedics;  Laterality: Left;  . Humerus fracture surgery Left 11/04/11    with plate to the shoulder  . Posterior cervical fusion/foraminotomy N/A 03/21/2012    Procedure: POSTERIOR CERVICAL FUSION/FORAMINOTOMY LEVEL ONE;  Surgeon: Charlie Pitter, MD;  Location: [facility];  Service: Neurosurgery;  Laterality: N/A;  POSTERIOR CERVICAL ONE TO CERVICAL TWO FUSION WITH ILIAC CREST GRAFT AND LATERAL MASS SCREWS      Current Outpatient Prescriptions  Medication Sig Dispense Refill  .  B Complex Vitamins (VITAMIN B COMPLEX PO) Take by mouth.    . hydrOXYzine (ATARAX/VISTARIL) 25 MG tablet TAKE 1 TABLET(25 MG) BY MOUTH THREE TIMES DAILY AS NEEDED FOR ITCHING 60 tablet 0  . Magnesium 500 MG TABS Take 500 mg by mouth daily.     . pantoprazole (PROTONIX) 40 MG tablet TAKE 1 TABLET TWICE A DAY 180 tablet 2  . promethazine (PHENERGAN) 25 MG tablet TAKE 1 TABLET DAILY AS NEEDED FOR NAUSEA 90 tablet 0  . QUEtiapine (SEROQUEL) 300 MG tablet TAKE 1 TABLET AT BEDTIME 90 tablet 1  . spironolactone (ALDACTONE) 50 MG tablet Take 1 tablet (50 mg total) by mouth daily. 90 tablet 3  . Thiamine HCl (VITAMIN B-1) 100 MG tablet Take 100 mg by mouth daily. Reported on 02/17/2015    . traZODone (DESYREL) 50 MG tablet Take 1 tablet (50 mg total) by mouth at bedtime. 30 tablet 1  . triamcinolone cream (KENALOG) 0.1 % Apply 1 application topically daily as needed (itching).   3   No current facility-administered medications for this visit.     Allergies:   Bee venom and Nifedipine    Social History:  The patient  reports that he has never smoked. He has never used smokeless tobacco. He reports that he drinks about 1.2 oz of alcohol per week. He reports that he does not use illicit drugs.   Family History:  The patient's family history includes Alcohol abuse in his brother; Depression in his mother; Diabetes in his father; Heart disease in his brother and father; Liver disease in his brother; Stroke in his father.    ROS:  Please see the history of present illness.   Otherwise, review of systems are positive for none.   All other systems are reviewed and negative.    PHYSICAL EXAM: VS:  BP 126/84 mmHg  Pulse 62  Ht 5\' 9"  (1.753 m)  Wt 193 lb 12.8 oz (87.907 kg)  BMI 28.61 kg/m2 , BMI Body mass index is 28.61 kg/(m^2). GEN: Well nourished, well developed, in no acute distress HEENT: normal Neck: no JVD, carotid bruits, or masses Cardiac: RRR; no murmurs, rubs, or gallops,no edema  Respiratory:  clear to auscultation bilaterally, normal work of breathing GI: soft, nontender, nondistended, + BS MS: no deformity or atrophy Skin: warm and dry, no rash Neuro:  Strength and sensation are intact Psych: euthymic mood, full affect   EKG:  EKG is ordered today. The ekg ordered today demonstrates sinus tachycardia with PACs, inferior and anterior infarcts and PVCs   Recent Labs: 01/27/2015: Hemoglobin 13.0; Platelets 209.0 03/10/2015: ALT 14; BUN 8; Creatinine, Ser 1.06; Potassium 4.3; Sodium 136    Lipid Panel    Component Value Date/Time   CHOL 150 10/24/2014 0845   TRIG 166.0* 10/24/2014 0845   HDL 49.80 10/24/2014 0845   CHOLHDL 3 10/24/2014 0845   VLDL 33.2 10/24/2014 0845   LDLCALC 67 10/24/2014 0845      Wt Readings from Last 3 Encounters:  04/07/15 193 lb 12.8 oz (87.907 kg)  03/24/15 195 lb 4 oz (88.565 kg)  03/10/15 197 lb 8 oz (89.585 kg)       ASSESSMENT AND PLAN:  1.  Tachycardia with recent  EKG showing only PACs.  I would like to get a 30 day heart monitor to assess for other atrial arrhythmias given the large number of PACs on EKG> 2.  HTN - BP controlled on exam today on aldactone 3.  PACs by prior  EKG 4.  DOE that may be due to obesity but given abnormal EKG with inferior and anterior infarcts, will proceed with stress myoview and echo   Current medicines are reviewed at length with the patient today.  The patient does not have concerns regarding medicines.  The following changes have been made:  no change  Labs/ tests ordered today: See above Assessment and Plan No orders of the defined types were placed in this encounter.     Disposition:   FU with me PRN pending results of studies   SignedSueanne Margarita, MD  04/07/2015 11:31 AM    [facility] [place], [place], [place]  [address] Phone: [phone]; Fax: [phone]

## 2015-04-07 NOTE — Patient Instructions (Addendum)
Medication Instructions:  Your physician recommends that you continue on your current medications as directed. Please refer to the Current Medication list given to you today.   Labwork: None  Testing/Procedures: Your physician has requested that you have an echocardiogram STAT. Echocardiography is a painless test that uses sound waves to create images of your heart. It provides your doctor with information about the size and shape of your heart and how well your heart's chambers and valves are working. This procedure takes approximately one hour. There are no restrictions for this procedure.   Your physician has recommended that you wear an event monitor. Event monitors are medical devices that record the heart's electrical activity. Doctors most often [place] these monitors to diagnose arrhythmias. Arrhythmias are problems with the speed or rhythm of the heartbeat. The monitor is a small, portable device. You can wear one while you do your normal daily activities. This is usually used to diagnose what is causing palpitations/syncope (passing out).   Dr. Radford Pax recommends you have a NUCLEAR STRESS TEST STAT.  Follow-Up: Your physician recommends that you schedule a follow-up appointment AS NEEDED with Dr. Radford Pax pending study results.  Any Other Special Instructions Will Be Listed Below (If Applicable).     If you need a refill on your cardiac medications before your next appointment, please call your pharmacy.

## 2015-04-07 NOTE — Telephone Encounter (Signed)
Patient given detailed instructions per Myocardial Perfusion Study Information Sheet for the test on 04/10/15 at 0715. Patient notified to arrive 15 minutes early and that it is imperative to arrive on time for appointment to keep from having the test rescheduled.  If you need to cancel or reschedule your appointment, please call the office within 24 hours of your appointment. Failure to do so may result in a cancellation of your appointment, and a $50 no show fee. Patient verbalized understanding.Guled Gahan, Ranae Palms

## 2015-04-08 ENCOUNTER — Other Ambulatory Visit: Payer: Self-pay

## 2015-04-08 ENCOUNTER — Ambulatory Visit (HOSPITAL_COMMUNITY): Payer: Medicare Other | Attending: Cardiology

## 2015-04-08 DIAGNOSIS — R0600 Dyspnea, unspecified: Secondary | ICD-10-CM | POA: Diagnosis present

## 2015-04-08 DIAGNOSIS — Z8249 Family history of ischemic heart disease and other diseases of the circulatory system: Secondary | ICD-10-CM | POA: Insufficient documentation

## 2015-04-08 DIAGNOSIS — I739 Peripheral vascular disease, unspecified: Secondary | ICD-10-CM | POA: Insufficient documentation

## 2015-04-08 DIAGNOSIS — R0609 Other forms of dyspnea: Secondary | ICD-10-CM | POA: Insufficient documentation

## 2015-04-08 DIAGNOSIS — R000 Tachycardia, unspecified: Secondary | ICD-10-CM | POA: Diagnosis not present

## 2015-04-08 DIAGNOSIS — I491 Atrial premature depolarization: Secondary | ICD-10-CM | POA: Insufficient documentation

## 2015-04-08 DIAGNOSIS — E785 Hyperlipidemia, unspecified: Secondary | ICD-10-CM | POA: Insufficient documentation

## 2015-04-08 DIAGNOSIS — I10 Essential (primary) hypertension: Secondary | ICD-10-CM | POA: Insufficient documentation

## 2015-04-09 ENCOUNTER — Other Ambulatory Visit: Payer: Self-pay | Admitting: Family Medicine

## 2015-04-09 ENCOUNTER — Other Ambulatory Visit: Payer: Self-pay | Admitting: Orthopedic Surgery

## 2015-04-09 DIAGNOSIS — M19112 Post-traumatic osteoarthritis, left shoulder: Secondary | ICD-10-CM | POA: Diagnosis not present

## 2015-04-09 DIAGNOSIS — M25512 Pain in left shoulder: Secondary | ICD-10-CM

## 2015-04-10 ENCOUNTER — Ambulatory Visit (INDEPENDENT_AMBULATORY_CARE_PROVIDER_SITE_OTHER): Payer: Medicare Other

## 2015-04-10 ENCOUNTER — Ambulatory Visit (HOSPITAL_COMMUNITY): Payer: Medicare Other | Attending: Cardiovascular Disease

## 2015-04-10 DIAGNOSIS — I491 Atrial premature depolarization: Secondary | ICD-10-CM | POA: Insufficient documentation

## 2015-04-10 DIAGNOSIS — R9439 Abnormal result of other cardiovascular function study: Secondary | ICD-10-CM | POA: Insufficient documentation

## 2015-04-10 DIAGNOSIS — I739 Peripheral vascular disease, unspecified: Secondary | ICD-10-CM | POA: Insufficient documentation

## 2015-04-10 DIAGNOSIS — R0609 Other forms of dyspnea: Secondary | ICD-10-CM | POA: Diagnosis not present

## 2015-04-10 DIAGNOSIS — I119 Hypertensive heart disease without heart failure: Secondary | ICD-10-CM | POA: Insufficient documentation

## 2015-04-10 DIAGNOSIS — R000 Tachycardia, unspecified: Secondary | ICD-10-CM | POA: Insufficient documentation

## 2015-04-10 LAB — MYOCARDIAL PERFUSION IMAGING
CHL CUP NUCLEAR SDS: 0
CHL CUP NUCLEAR SRS: 6
CHL CUP RESTING HR STRESS: 105 {beats}/min
LV dias vol: 91 mL (ref 62–150)
LV sys vol: 55 mL
Peak HR: 107 {beats}/min
RATE: 0.22
SSS: 6
TID: 1.12

## 2015-04-10 MED ORDER — TECHNETIUM TC 99M SESTAMIBI GENERIC - CARDIOLITE
31.5000 | Freq: Once | INTRAVENOUS | Status: AC | PRN
  Administered 2015-04-10: 32 via INTRAVENOUS

## 2015-04-10 MED ORDER — TECHNETIUM TC 99M SESTAMIBI GENERIC - CARDIOLITE
10.3000 | Freq: Once | INTRAVENOUS | Status: AC | PRN
  Administered 2015-04-10: 10 via INTRAVENOUS

## 2015-04-10 MED ORDER — REGADENOSON 0.4 MG/5ML IV SOLN
0.4000 mg | Freq: Once | INTRAVENOUS | Status: AC
Start: 2015-04-10 — End: 2015-04-10
  Administered 2015-04-10: 0.4 mg via INTRAVENOUS

## 2015-04-11 DIAGNOSIS — R000 Tachycardia, unspecified: Secondary | ICD-10-CM | POA: Diagnosis not present

## 2015-04-15 ENCOUNTER — Telehealth: Payer: Self-pay | Admitting: Cardiology

## 2015-04-15 NOTE — Telephone Encounter (Signed)
Left message to call back  

## 2015-04-15 NOTE — Telephone Encounter (Signed)
Returned call to patient's wife and reiterated results.  OV scheduled 4/19 with Cecilie Kicks, NP. She refused earlier appointment. She understands to seek medical attention if patient experiences CP or SOB worsens.

## 2015-04-15 NOTE — Telephone Encounter (Signed)
-----   Message from Sueanne Margarita, MD sent at 04/11/2015  1:09 PM EDT ----- Fixed defect in apical and apicolateral defect that could be diaphragmatic attenuation or scar,  EF reduced on nuclear stress test at 39% but by echo 45-50% with no wall motion abnormality but does have inferior infarct pattern on EKG.  Patient has not had any chest pain but has had DOE.  Please set up to see PA to set up cath

## 2015-04-15 NOTE — Telephone Encounter (Signed)
New message     The nurse called the patient yesterday but pt did not understand what was said.  He asked his wife to call and the nurse tell her what she was telling him yesterday.

## 2015-04-18 ENCOUNTER — Ambulatory Visit
Admission: RE | Admit: 2015-04-18 | Discharge: 2015-04-18 | Disposition: A | Discharge status: Home / Self Care | Payer: Medicare Other | Source: Ambulatory Visit | Attending: Orthopedic Surgery | Admitting: Orthopedic Surgery

## 2015-04-18 DIAGNOSIS — M25512 Pain in left shoulder: Secondary | ICD-10-CM | POA: Diagnosis not present

## 2015-04-18 MED ORDER — METHYLPREDNISOLONE ACETATE 40 MG/ML INJ SUSP (RADIOLOG
120.0000 mg | Freq: Once | INTRAMUSCULAR | Status: AC
  Administered 2015-04-18: 120 mg via INTRA_ARTICULAR

## 2015-04-18 MED ORDER — IOHEXOL 180 MG/ML  SOLN
1.0000 mL | Freq: Once | INTRAMUSCULAR | Status: AC | PRN
  Administered 2015-04-18: 1 mL via INTRA_ARTICULAR

## 2015-04-28 ENCOUNTER — Other Ambulatory Visit: Payer: Self-pay | Admitting: *Deleted

## 2015-04-28 MED ORDER — TRAZODONE HCL 50 MG PO TABS: 50.0000 mg | ORAL_TABLET | Freq: Every day | ORAL | Status: DC

## 2015-04-28 NOTE — Telephone Encounter (Signed)
Received faxed refilled request Last refill 03/24/15 #30/1 Last office visit 03/24/15/acute

## 2015-04-30 ENCOUNTER — Ambulatory Visit: Payer: Medicare Other | Admitting: Cardiology

## 2015-05-07 ENCOUNTER — Encounter: Payer: Self-pay | Admitting: Cardiovascular Disease

## 2015-05-07 ENCOUNTER — Encounter: Payer: Self-pay | Admitting: Physician Assistant

## 2015-05-07 ENCOUNTER — Ambulatory Visit (INDEPENDENT_AMBULATORY_CARE_PROVIDER_SITE_OTHER): Payer: Medicare Other | Admitting: Physician Assistant

## 2015-05-07 VITALS — BP 130/80 | HR 88 | Ht 69.0 in | Wt 206.0 lb

## 2015-05-07 DIAGNOSIS — R9439 Abnormal result of other cardiovascular function study: Secondary | ICD-10-CM | POA: Insufficient documentation

## 2015-05-07 DIAGNOSIS — R931 Abnormal findings on diagnostic imaging of heart and coronary circulation: Secondary | ICD-10-CM

## 2015-05-07 DIAGNOSIS — R5383 Other fatigue: Secondary | ICD-10-CM | POA: Diagnosis not present

## 2015-05-07 DIAGNOSIS — I10 Essential (primary) hypertension: Secondary | ICD-10-CM

## 2015-05-07 DIAGNOSIS — Z01818 Encounter for other preprocedural examination: Secondary | ICD-10-CM | POA: Diagnosis not present

## 2015-05-07 DIAGNOSIS — R000 Tachycardia, unspecified: Secondary | ICD-10-CM

## 2015-05-07 DIAGNOSIS — D689 Coagulation defect, unspecified: Secondary | ICD-10-CM | POA: Diagnosis not present

## 2015-05-07 NOTE — Progress Notes (Signed)
Patient ID: [name], male   DOB: 10-21-1945, 70 y.o.   MRN: [id number]    Date:  05/07/2015   ID:  [name], DOB 11/17/1945, MRN [id number]  PCP:  [name], MD  Primary Cardiologist:  Radford Pax  Chief Complaint  Patient presents with  . Discuss Cath    No symptoms today, struggling with allergies     History of Present Illness: [name] is a 70 y.o. male was seen by his PCP in February and was noted to have an elevated HR. EKG was done which showed sinus tachycardia with PAC's. He was seen by Dr. Radford Pax on March 27. At that time he was not aware of the extra heart beats and denied any palpitations. He denied chest pain or pressure. He admited to [place] when going up and down the stairs. He denied any dizziness except when getting out of his recliner too fast. He denied any syncope, any LE edema or claudications symptoms. He denied any PND, orthopnea. His history also includes alcohol abuse, upper GI bleed, liver cirrhosis.  The patient presented to go over his stress test results which was interpreted as small size, moderate severity fixed apical/apical lateral defect which is likely scar. No significant reversible ischemia. LVEF 39% with global hypokinesis and apical dyskinesis.   The patient currently denies nausea, vomiting, fever, chest pain, orthopnea, dizziness, PND, cough, congestion, abdominal pain, hematochezia, melena, lower extremity edema, claudication.  Wt Readings from Last 3 Encounters:  05/07/15 206 lb (93.441 kg)  04/10/15 193 lb (87.544 kg)  04/07/15 193 lb 12.8 oz (87.907 kg)     Past Medical History  Diagnosis Date  . HLD (hyperlipidemia) 5/99  . HTN (hypertension) 5/99  . BPH (benign prostatic hypertrophy) 5/99  . GERD (gastroesophageal reflux disease) 07/31/04    gastritis   . Anxiety   . Allergy     pollen/ragweed  . Adenomatous polyp of colon 2006  . Hearing difficulty   . Humerus fracture 11/02/2011    LUE  . Peripheral vascular  disease ([place])   . Pneumonia 1996    hosp  . Chronic bronchitis ([place])     "q year or q other year" (11/02/2011)  . History of blood transfusion 2012  . Anemia 2012  . Hernia, umbilical     "unrepaired" (11/02/2011)  . H/O hiatal hernia   . Memory loss     "recently has been forgetting alot; maybe related to the alcohol" (11/02/2011)  . Falls frequently     "daily lately; legs just give out" (11/02/2011)  . H/O nausea   . Depression     Hx of  . Shortness of breath     with exertion  . Seasonal allergies   . Colon polyp   . PAC (premature atrial contraction) 04/07/2015  . Tachycardia 04/07/2015    Current Outpatient Prescriptions  Medication Sig Dispense Refill  . B Complex Vitamins (VITAMIN B COMPLEX PO) Take by mouth.    . Magnesium 500 MG TABS Take 500 mg by mouth daily.     . pantoprazole (PROTONIX) 40 MG tablet TAKE 1 TABLET TWICE A DAY 180 tablet 2  . promethazine (PHENERGAN) 25 MG tablet TAKE 1 TABLET DAILY AS NEEDED FOR NAUSEA 90 tablet 0  . QUEtiapine (SEROQUEL) 300 MG tablet TAKE 1 TABLET AT BEDTIME 90 tablet 1  . spironolactone (ALDACTONE) 50 MG tablet Take 1 tablet (50 mg total) by mouth daily. 90 tablet 3  . Thiamine HCl (VITAMIN B-1)  100 MG tablet Take 100 mg by mouth daily. Reported on 02/17/2015    . traZODone (DESYREL) 50 MG tablet Take 1 tablet (50 mg total) by mouth at bedtime. 30 tablet 1  . triamcinolone cream (KENALOG) 0.1 % Apply 1 application topically daily as needed (itching).   3   No current facility-administered medications for this visit.    Allergies:    Allergies  Allergen Reactions  . Nifedipine     REACTION: SWELLING 11/02/2011 pt denies this allergy.    Social History:  The patient  reports that he has never smoked. He has never used smokeless tobacco. He reports that he drinks about 1.2 oz of alcohol per week. He reports that he does not use illicit drugs.   Family history:   Family History  Problem Relation Age of Onset  . Heart  disease Father   . Diabetes Father   . Stroke Father   . Depression Mother   . Heart disease Brother   . Alcohol abuse Brother   . Liver disease Brother     ROS:  Please see the history of present illness.  All other systems reviewed and negative.   PHYSICAL EXAM: VS:  BP 130/80 mmHg  Pulse 88  Ht 5\' 9"  (1.753 m)  Wt 206 lb (93.441 kg)  BMI 30.41 kg/m2 Obese, well developed, in no acute distress HEENT: Pupils are equal round react to light accommodation extraocular movements are intact.  Neck: no JVD.No cervical lymphadenopathy. Cardiac: Regular rate and rhythm without murmurs rubs or gallops. Lungs:  clear to auscultation bilaterally, no wheezing, rhonchi or rales Abd: soft, nontender, positive bowel sounds all quadrants, no hepatosplenomegaly Ext: no lower extremity edema.  2+ radial and dorsalis pedis pulses. Skin: warm and dry Neuro:  Grossly normal    ASSESSMENT AND PLAN:  Problem List Items Addressed This Visit    Tachycardia   Essential hypertension   Abnormal nuclear stress test - Primary   Relevant Orders   CBC   Basic metabolic panel   Protime-INR   APTT   TSH   DG Chest 2 View   LEFT HEART CATHETERIZATION WITH CORONARY/GRAFT ANGIOGRAM    Other Visit Diagnoses    Preop examination        Relevant Orders    CBC    Basic metabolic panel    Protime-INR    APTT    TSH    DG Chest 2 View    Blood clotting disorder (HCC)        Relevant Orders    Protime-INR    APTT    Other fatigue        Relevant Orders    CBC    Basic metabolic panel    TSH      Patient presented to over results of his nuclear stress test and discuss left heart catheterization. The procedure was discussed in detail with him and his wife.  The patient understands that risks include but are not limited to stroke (1 in 1000), death (1 in 60), kidney failure [usually temporary] (1 in 500), bleeding (1 in 200), allergic reaction [possibly serious] (1 in 200). The patient  understands and is willing to proceed.  Procedure to be scheduled the week of May 8.  He continues to wear the CardioNet monitor and is scheduled to turn that in April 29

## 2015-05-07 NOTE — Patient Instructions (Signed)
Your physician has requested that you have a left cardiac catheterization the week of May 8th. Cardiac catheterization is used to diagnose and/or treat various heart conditions. Doctors may recommend this procedure for a number of different reasons. The most common reason is to evaluate chest pain. Chest pain can be a symptom of coronary artery disease (CAD), and cardiac catheterization can show whether plaque is narrowing or blocking your heart's arteries. This procedure is also used to evaluate the valves, as well as measure the blood flow and oxygen levels in different parts of your heart. For further information please visit [email or web address].   Following your catheterization, you will not be allowed to drive for 3 days.  No lifting, pushing, or pulling greater that 10 pounds is allowed for 1 week.  You will be required to have the following tests prior to the procedure:  1. Blood work-the blood work can be done no more than 7 days prior to the procedure. It can be done at any [facility] lab. There is one downstairs on the first floor of this building and one in the [facility] building [phone]).  2. Chest Xray-the chest xray order has already been placed at the [facility].

## 2015-05-16 ENCOUNTER — Ambulatory Visit
Admission: RE | Admit: 2015-05-16 | Discharge: 2015-05-16 | Disposition: A | Discharge status: Home / Self Care | Payer: Medicare Other | Source: Ambulatory Visit | Attending: Physician Assistant | Admitting: Physician Assistant

## 2015-05-16 DIAGNOSIS — R9439 Abnormal result of other cardiovascular function study: Secondary | ICD-10-CM

## 2015-05-16 DIAGNOSIS — D689 Coagulation defect, unspecified: Secondary | ICD-10-CM | POA: Diagnosis not present

## 2015-05-16 DIAGNOSIS — R5383 Other fatigue: Secondary | ICD-10-CM | POA: Diagnosis not present

## 2015-05-16 DIAGNOSIS — Z01818 Encounter for other preprocedural examination: Secondary | ICD-10-CM | POA: Diagnosis not present

## 2015-05-16 DIAGNOSIS — R931 Abnormal findings on diagnostic imaging of heart and coronary circulation: Secondary | ICD-10-CM | POA: Diagnosis not present

## 2015-05-16 LAB — BASIC METABOLIC PANEL
BUN: 7 mg/dL (ref 7–25)
CALCIUM: 8.7 mg/dL (ref 8.6–10.3)
CO2: 25 mmol/L (ref 20–31)
Chloride: 91 mmol/L — ABNORMAL LOW (ref 98–110)
Creat: 0.95 mg/dL (ref 0.70–1.25)
GLUCOSE: 101 mg/dL — AB (ref 65–99)
Potassium: 5 mmol/L (ref 3.5–5.3)
SODIUM: 126 mmol/L — AB (ref 135–146)

## 2015-05-16 LAB — CBC
HCT: 36.5 % — ABNORMAL LOW (ref 38.5–50.0)
Hemoglobin: 12.3 g/dL — ABNORMAL LOW (ref 13.2–17.1)
MCH: 30.1 pg (ref 27.0–33.0)
MCHC: 33.7 g/dL (ref 32.0–36.0)
MCV: 89.2 fL (ref 80.0–100.0)
MPV: 9.1 fL (ref 7.5–12.5)
PLATELETS: 150 10*3/uL (ref 140–400)
RBC: 4.09 MIL/uL — ABNORMAL LOW (ref 4.20–5.80)
RDW: 13.2 % (ref 11.0–15.0)
WBC: 6.3 10*3/uL (ref 3.8–10.8)

## 2015-05-16 LAB — TSH: TSH: 1.89 m[IU]/L (ref 0.40–4.50)

## 2015-05-16 LAB — APTT: APTT: 38 s — AB (ref 24–37)

## 2015-05-16 LAB — PROTIME-INR
INR: 1.21 (ref <1.50)
PROTHROMBIN TIME: 15.5 s — AB (ref 11.6–15.2)

## 2015-05-21 ENCOUNTER — Telehealth: Payer: Self-pay | Admitting: Cardiology

## 2015-05-21 MED ORDER — METOPROLOL SUCCINATE ER 25 MG PO TB24: 25.0000 mg | ORAL_TABLET | Freq: Every day | ORAL | Status: DC

## 2015-05-21 NOTE — Telephone Encounter (Signed)
New message    Patient calling back stating nurse or someone called him on yesterday  - he could not  hear what they said asking for a call back - possible cath procedure

## 2015-05-21 NOTE — Telephone Encounter (Signed)
Spoke with pt and informed him of monitor results and new orders per Dr. Radford Pax. Pt asked that I send in a 30 day supply of medication to local pharmacy and see if he can tolerate the medication. Pt states if he can tolerate medication he will then want a 90 day supply sent to [facility]. Scheduled pt to see Almyra Deforest, PA on 5/24. Pt verbalized understanding of instructions.

## 2015-05-23 ENCOUNTER — Encounter (HOSPITAL_COMMUNITY): Payer: Self-pay | Admitting: Cardiovascular Disease

## 2015-05-23 ENCOUNTER — Ambulatory Visit (HOSPITAL_COMMUNITY)
Admission: RE | Admit: 2015-05-23 | Discharge: 2015-05-23 | Disposition: A | Discharge status: Home / Self Care | Payer: Medicare Other | Source: Ambulatory Visit | Attending: Cardiovascular Disease | Admitting: Cardiovascular Disease

## 2015-05-23 ENCOUNTER — Encounter (HOSPITAL_COMMUNITY)
Admission: RE | Disposition: A | Discharge status: Home / Self Care | Payer: Self-pay | Source: Ambulatory Visit | Attending: Cardiovascular Disease

## 2015-05-23 DIAGNOSIS — I739 Peripheral vascular disease, unspecified: Secondary | ICD-10-CM | POA: Diagnosis not present

## 2015-05-23 DIAGNOSIS — E785 Hyperlipidemia, unspecified: Secondary | ICD-10-CM | POA: Insufficient documentation

## 2015-05-23 DIAGNOSIS — R0609 Other forms of dyspnea: Secondary | ICD-10-CM

## 2015-05-23 DIAGNOSIS — K7030 Alcoholic cirrhosis of liver without ascites: Secondary | ICD-10-CM | POA: Insufficient documentation

## 2015-05-23 DIAGNOSIS — I491 Atrial premature depolarization: Secondary | ICD-10-CM | POA: Insufficient documentation

## 2015-05-23 DIAGNOSIS — R931 Abnormal findings on diagnostic imaging of heart and coronary circulation: Secondary | ICD-10-CM | POA: Diagnosis not present

## 2015-05-23 DIAGNOSIS — I10 Essential (primary) hypertension: Secondary | ICD-10-CM | POA: Diagnosis not present

## 2015-05-23 DIAGNOSIS — F329 Major depressive disorder, single episode, unspecified: Secondary | ICD-10-CM | POA: Insufficient documentation

## 2015-05-23 DIAGNOSIS — R0602 Shortness of breath: Secondary | ICD-10-CM | POA: Insufficient documentation

## 2015-05-23 DIAGNOSIS — I2510 Atherosclerotic heart disease of native coronary artery without angina pectoris: Secondary | ICD-10-CM | POA: Insufficient documentation

## 2015-05-23 DIAGNOSIS — R9439 Abnormal result of other cardiovascular function study: Secondary | ICD-10-CM | POA: Diagnosis present

## 2015-05-23 DIAGNOSIS — R002 Palpitations: Secondary | ICD-10-CM | POA: Diagnosis not present

## 2015-05-23 DIAGNOSIS — R000 Tachycardia, unspecified: Secondary | ICD-10-CM | POA: Insufficient documentation

## 2015-05-23 DIAGNOSIS — Z8249 Family history of ischemic heart disease and other diseases of the circulatory system: Secondary | ICD-10-CM | POA: Diagnosis not present

## 2015-05-23 DIAGNOSIS — K219 Gastro-esophageal reflux disease without esophagitis: Secondary | ICD-10-CM | POA: Insufficient documentation

## 2015-05-23 DIAGNOSIS — Z823 Family history of stroke: Secondary | ICD-10-CM | POA: Diagnosis not present

## 2015-05-23 DIAGNOSIS — D649 Anemia, unspecified: Secondary | ICD-10-CM | POA: Diagnosis not present

## 2015-05-23 DIAGNOSIS — F419 Anxiety disorder, unspecified: Secondary | ICD-10-CM | POA: Insufficient documentation

## 2015-05-23 HISTORY — PX: CARDIAC CATHETERIZATION: SHX172

## 2015-05-23 SURGERY — LEFT HEART CATH AND CORONARY ANGIOGRAPHY
Anesthesia: LOCAL

## 2015-05-23 MED ORDER — MIDAZOLAM HCL 2 MG/2ML IJ SOLN
INTRAMUSCULAR | Status: DC | PRN
  Administered 2015-05-23: 2 mg via INTRAVENOUS
  Administered 2015-05-23: 1 mg via INTRAVENOUS

## 2015-05-23 MED ORDER — SODIUM CHLORIDE 0.9% FLUSH: 3.0000 mL | INTRAVENOUS | Status: DC | PRN

## 2015-05-23 MED ORDER — IOPAMIDOL (ISOVUE-370) INJECTION 76%
INTRAVENOUS | Status: AC
  Filled 2015-05-23: qty 100

## 2015-05-23 MED ORDER — MIDAZOLAM HCL 2 MG/2ML IJ SOLN
INTRAMUSCULAR | Status: AC
  Filled 2015-05-23: qty 2

## 2015-05-23 MED ORDER — ACETAMINOPHEN 325 MG PO TABS: 650.0000 mg | ORAL_TABLET | ORAL | Status: DC | PRN

## 2015-05-23 MED ORDER — HEPARIN SODIUM (PORCINE) 1000 UNIT/ML IJ SOLN
INTRAMUSCULAR | Status: DC | PRN
  Administered 2015-05-23: 4500 [IU] via INTRAVENOUS

## 2015-05-23 MED ORDER — DIAZEPAM 5 MG PO TABS
5.0000 mg | ORAL_TABLET | Freq: Four times a day (QID) | ORAL | Status: DC | PRN
Start: 2015-05-23 — End: 2015-05-23

## 2015-05-23 MED ORDER — LIDOCAINE-EPINEPHRINE 1 %-1:100000 IJ SOLN
INTRAMUSCULAR | Status: AC
  Filled 2015-05-23: qty 1

## 2015-05-23 MED ORDER — SODIUM CHLORIDE 0.9% FLUSH: 3.0000 mL | Freq: Two times a day (BID) | INTRAVENOUS | Status: DC

## 2015-05-23 MED ORDER — IOPAMIDOL (ISOVUE-370) INJECTION 76%
INTRAVENOUS | Status: DC | PRN
  Administered 2015-05-23: 90 mL via INTRA_ARTERIAL

## 2015-05-23 MED ORDER — SODIUM CHLORIDE 0.9 % IV SOLN: 250.0000 mL | INTRAVENOUS | Status: DC | PRN

## 2015-05-23 MED ORDER — FENTANYL CITRATE (PF) 100 MCG/2ML IJ SOLN
INTRAMUSCULAR | Status: AC
  Filled 2015-05-23: qty 2

## 2015-05-23 MED ORDER — LIDOCAINE HCL (PF) 1 % IJ SOLN
INTRAMUSCULAR | Status: DC | PRN
  Administered 2015-05-23: 15 mL via INTRA_ARTERIAL

## 2015-05-23 MED ORDER — SODIUM CHLORIDE 0.9 % WEIGHT BASED INFUSION
3.0000 mL/kg/h | INTRAVENOUS | Status: AC
  Administered 2015-05-23: 3 mL/kg/h via INTRAVENOUS

## 2015-05-23 MED ORDER — SODIUM CHLORIDE 0.9 % IV SOLN: INTRAVENOUS | Status: DC

## 2015-05-23 MED ORDER — ASPIRIN 81 MG PO CHEW
81.0000 mg | CHEWABLE_TABLET | ORAL | Status: AC
  Administered 2015-05-23: 81 mg via ORAL

## 2015-05-23 MED ORDER — VERAPAMIL HCL 2.5 MG/ML IV SOLN
INTRAVENOUS | Status: AC
  Filled 2015-05-23: qty 2

## 2015-05-23 MED ORDER — ASPIRIN 81 MG PO CHEW
CHEWABLE_TABLET | ORAL | Status: AC
  Administered 2015-05-23: 81 mg via ORAL
  Filled 2015-05-23: qty 1

## 2015-05-23 MED ORDER — HEPARIN (PORCINE) IN NACL 2-0.9 UNIT/ML-% IJ SOLN
INTRAMUSCULAR | Status: AC
  Filled 2015-05-23: qty 1000

## 2015-05-23 MED ORDER — SODIUM CHLORIDE 0.9 % WEIGHT BASED INFUSION: 1.0000 mL/kg/h | INTRAVENOUS | Status: DC

## 2015-05-23 MED ORDER — HEPARIN SODIUM (PORCINE) 1000 UNIT/ML IJ SOLN
INTRAMUSCULAR | Status: AC
  Filled 2015-05-23: qty 1

## 2015-05-23 MED ORDER — NITROGLYCERIN 1 MG/10 ML FOR IR/CATH LAB
INTRA_ARTERIAL | Status: AC
  Filled 2015-05-23: qty 10

## 2015-05-23 MED ORDER — LIDOCAINE HCL (PF) 1 % IJ SOLN
INTRAMUSCULAR | Status: DC | PRN
  Administered 2015-05-23: 2 mL

## 2015-05-23 MED ORDER — HEPARIN (PORCINE) IN NACL 2-0.9 UNIT/ML-% IJ SOLN
INTRAMUSCULAR | Status: DC | PRN
  Administered 2015-05-23: 1000 mL

## 2015-05-23 MED ORDER — ONDANSETRON HCL 4 MG/2ML IJ SOLN: 4.0000 mg | Freq: Four times a day (QID) | INTRAMUSCULAR | Status: DC | PRN

## 2015-05-23 MED ORDER — LIDOCAINE HCL (PF) 1 % IJ SOLN
INTRAMUSCULAR | Status: AC
  Filled 2015-05-23: qty 30

## 2015-05-23 MED ORDER — FENTANYL CITRATE (PF) 100 MCG/2ML IJ SOLN
INTRAMUSCULAR | Status: DC | PRN
  Administered 2015-05-23: 50 ug via INTRAVENOUS

## 2015-05-23 SURGICAL SUPPLY — 12 items
CATH INFINITI 5FR ANG PIGTAIL (CATHETERS) ×1 IMPLANT
CATH INFINITI JR4 5F (CATHETERS) ×1 IMPLANT
CATH OPTITORQUE TIG 4.0 5F (CATHETERS) ×1 IMPLANT
DEVICE RAD COMP TR BAND LRG (VASCULAR PRODUCTS) ×2 IMPLANT
GLIDESHEATH SLEND SS 6F .021 (SHEATH) ×1 IMPLANT
KIT HEART LEFT (KITS) ×2 IMPLANT
PACK CARDIAC CATHETERIZATION (CUSTOM PROCEDURE TRAY) ×2 IMPLANT
SYR MEDRAD MARK V 150ML (SYRINGE) ×2 IMPLANT
TRANSDUCER W/STOPCOCK (MISCELLANEOUS) ×2 IMPLANT
TUBING CIL FLEX 10 FLL-RA (TUBING) ×2 IMPLANT
WIRE HI TORQ VERSACORE-J 145CM (WIRE) ×1 IMPLANT
WIRE SAFE-T 1.5MM-J .035X260CM (WIRE) ×1 IMPLANT

## 2015-05-23 NOTE — Interval H&P Note (Signed)
Cath Lab Visit (complete for each Cath Lab visit)  Clinical Evaluation Leading to the Procedure:   ACS: No.  Non-ACS:    Anginal Classification: CCS II  Anti-ischemic medical therapy: Minimal Therapy (1 class of medications)  Non-Invasive Test Results: Intermediate-risk stress test findings: cardiac mortality 1-3%/year  Prior CABG: No previous CABG      History and Physical Interval Note:  05/23/2015 7:51 AM  [name]  has presented today for surgery, with the diagnosis of abnormal stress test  The various methods of treatment have been discussed with the patient and family. After consideration of risks, benefits and other options for treatment, the patient has consented to  Procedure(s): Left Heart Cath and Coronary Angiography (N/A) as a surgical intervention .  The patient's history has been reviewed, patient examined, no change in status, stable for surgery.  I have reviewed the patient's chart and labs.  Questions were answered to the patient's satisfaction.     Shelva Majestic

## 2015-05-23 NOTE — H&P (View-Only) (Signed)
Patient ID: [name], male   DOB: 10-21-1945, 70 y.o.   MRN: [id number]    Date:  05/07/2015   ID:  [name], DOB 11/17/1945, MRN [id number]  PCP:  [name], MD  Primary Cardiologist:  Radford Pax  Chief Complaint  Patient presents with  . Discuss Cath    No symptoms today, struggling with allergies     History of Present Illness: [name] is a 70 y.o. male was seen by his PCP in February and was noted to have an elevated HR. EKG was done which showed sinus tachycardia with PAC's. He was seen by Dr. Radford Pax on March 27. At that time he was not aware of the extra heart beats and denied any palpitations. He denied chest pain or pressure. He admited to [place] when going up and down the stairs. He denied any dizziness except when getting out of his recliner too fast. He denied any syncope, any LE edema or claudications symptoms. He denied any PND, orthopnea. His history also includes alcohol abuse, upper GI bleed, liver cirrhosis.  The patient presented to go over his stress test results which was interpreted as small size, moderate severity fixed apical/apical lateral defect which is likely scar. No significant reversible ischemia. LVEF 39% with global hypokinesis and apical dyskinesis.   The patient currently denies nausea, vomiting, fever, chest pain, orthopnea, dizziness, PND, cough, congestion, abdominal pain, hematochezia, melena, lower extremity edema, claudication.  Wt Readings from Last 3 Encounters:  05/07/15 206 lb (93.441 kg)  04/10/15 193 lb (87.544 kg)  04/07/15 193 lb 12.8 oz (87.907 kg)     Past Medical History  Diagnosis Date  . HLD (hyperlipidemia) 5/99  . HTN (hypertension) 5/99  . BPH (benign prostatic hypertrophy) 5/99  . GERD (gastroesophageal reflux disease) 07/31/04    gastritis   . Anxiety   . Allergy     pollen/ragweed  . Adenomatous polyp of colon 2006  . Hearing difficulty   . Humerus fracture 11/02/2011    LUE  . Peripheral vascular  disease ([place])   . Pneumonia 1996    hosp  . Chronic bronchitis ([place])     "q year or q other year" (11/02/2011)  . History of blood transfusion 2012  . Anemia 2012  . Hernia, umbilical     "unrepaired" (11/02/2011)  . H/O hiatal hernia   . Memory loss     "recently has been forgetting alot; maybe related to the alcohol" (11/02/2011)  . Falls frequently     "daily lately; legs just give out" (11/02/2011)  . H/O nausea   . Depression     Hx of  . Shortness of breath     with exertion  . Seasonal allergies   . Colon polyp   . PAC (premature atrial contraction) 04/07/2015  . Tachycardia 04/07/2015    Current Outpatient Prescriptions  Medication Sig Dispense Refill  . B Complex Vitamins (VITAMIN B COMPLEX PO) Take by mouth.    . Magnesium 500 MG TABS Take 500 mg by mouth daily.     . pantoprazole (PROTONIX) 40 MG tablet TAKE 1 TABLET TWICE A DAY 180 tablet 2  . promethazine (PHENERGAN) 25 MG tablet TAKE 1 TABLET DAILY AS NEEDED FOR NAUSEA 90 tablet 0  . QUEtiapine (SEROQUEL) 300 MG tablet TAKE 1 TABLET AT BEDTIME 90 tablet 1  . spironolactone (ALDACTONE) 50 MG tablet Take 1 tablet (50 mg total) by mouth daily. 90 tablet 3  . Thiamine HCl (VITAMIN B-1)  100 MG tablet Take 100 mg by mouth daily. Reported on 02/17/2015    . traZODone (DESYREL) 50 MG tablet Take 1 tablet (50 mg total) by mouth at bedtime. 30 tablet 1  . triamcinolone cream (KENALOG) 0.1 % Apply 1 application topically daily as needed (itching).   3   No current facility-administered medications for this visit.    Allergies:    Allergies  Allergen Reactions  . Nifedipine     REACTION: SWELLING 11/02/2011 pt denies this allergy.    Social History:  The patient  reports that he has never smoked. He has never used smokeless tobacco. He reports that he drinks about 1.2 oz of alcohol per week. He reports that he does not use illicit drugs.   Family history:   Family History  Problem Relation Age of Onset  . Heart  disease Father   . Diabetes Father   . Stroke Father   . Depression Mother   . Heart disease Brother   . Alcohol abuse Brother   . Liver disease Brother     ROS:  Please see the history of present illness.  All other systems reviewed and negative.   PHYSICAL EXAM: VS:  BP 130/80 mmHg  Pulse 88  Ht 5\' 9"  (1.753 m)  Wt 206 lb (93.441 kg)  BMI 30.41 kg/m2 Obese, well developed, in no acute distress HEENT: Pupils are equal round react to light accommodation extraocular movements are intact.  Neck: no JVD.No cervical lymphadenopathy. Cardiac: Regular rate and rhythm without murmurs rubs or gallops. Lungs:  clear to auscultation bilaterally, no wheezing, rhonchi or rales Abd: soft, nontender, positive bowel sounds all quadrants, no hepatosplenomegaly Ext: no lower extremity edema.  2+ radial and dorsalis pedis pulses. Skin: warm and dry Neuro:  Grossly normal    ASSESSMENT AND PLAN:  Problem List Items Addressed This Visit    Tachycardia   Essential hypertension   Abnormal nuclear stress test - Primary   Relevant Orders   CBC   Basic metabolic panel   Protime-INR   APTT   TSH   DG Chest 2 View   LEFT HEART CATHETERIZATION WITH CORONARY/GRAFT ANGIOGRAM    Other Visit Diagnoses    Preop examination        Relevant Orders    CBC    Basic metabolic panel    Protime-INR    APTT    TSH    DG Chest 2 View    Blood clotting disorder (HCC)        Relevant Orders    Protime-INR    APTT    Other fatigue        Relevant Orders    CBC    Basic metabolic panel    TSH      Patient presented to over results of his nuclear stress test and discuss left heart catheterization. The procedure was discussed in detail with him and his wife.  The patient understands that risks include but are not limited to stroke (1 in 1000), death (1 in 60), kidney failure [usually temporary] (1 in 500), bleeding (1 in 200), allergic reaction [possibly serious] (1 in 200). The patient  understands and is willing to proceed.  Procedure to be scheduled the week of May 8.  He continues to wear the CardioNet monitor and is scheduled to turn that in April 29

## 2015-05-23 NOTE — Discharge Instructions (Signed)
Radial Site Care °Refer to this sheet in the next few weeks. These instructions provide you with information about caring for yourself after your procedure. Your health care provider may also give you more specific instructions. Your treatment has been planned according to current medical practices, but problems sometimes occur. Call your health care provider if you have any problems or questions after your procedure. °WHAT TO EXPECT AFTER THE PROCEDURE °After your procedure, it is typical to have the following: °· Bruising at the radial site that usually fades within 1-2 weeks. °· Blood collecting in the tissue (hematoma) that may be painful to the touch. It should usually decrease in size and tenderness within 1-2 weeks. °HOME CARE INSTRUCTIONS °· Take medicines only as directed by your health care provider. °· You may shower 24-48 hours after the procedure or as directed by your health care provider. Remove the bandage (dressing) and gently wash the site with plain soap and water. Pat the area dry with a clean towel. Do not rub the site, because this may cause bleeding. °· Do not take baths, swim, or use a hot tub until your health care provider approves. °· Check your insertion site every day for redness, swelling, or drainage. °· Do not apply powder or lotion to the site. °· Do not flex or bend the affected arm for 24 hours or as directed by your health care provider. °· Do not push or pull heavy objects with the affected arm for 24 hours or as directed by your health care provider. °· Do not lift over 10 lb (4.5 kg) for 5 days after your procedure or as directed by your health care provider. °· Ask your health care provider when it is okay to: °¨ Return to work or school. °¨ Resume usual physical activities or sports. °¨ Resume sexual activity. °· Do not drive home if you are discharged the same day as the procedure. Have someone else drive you. °· You may drive 24 hours after the procedure unless otherwise  instructed by your health care provider. °· Do not operate machinery or power tools for 24 hours after the procedure. °· If your procedure was done as an outpatient procedure, which means that you went home the same day as your procedure, a responsible adult should be with you for the first 24 hours after you arrive home. °· Keep all follow-up visits as directed by your health care provider. This is important. °SEEK MEDICAL CARE IF: °· You have a fever. °· You have chills. °· You have increased bleeding from the radial site. Hold pressure on the site. °SEEK IMMEDIATE MEDICAL CARE IF: °· You have unusual pain at the radial site. °· You have redness, warmth, or swelling at the radial site. °· You have drainage (other than a small amount of blood on the dressing) from the radial site. °· The radial site is bleeding, and the bleeding does not stop after 30 minutes of holding steady pressure on the site. °· Your arm or hand becomes pale, cool, tingly, or numb. °  °This information is not intended to replace advice given to you by your health care provider. Make sure you discuss any questions you have with your health care provider. °  °Document Released: 01/30/2010 Document Revised: 01/18/2014 Document Reviewed: 07/16/2013 °Elsevier Interactive Patient Education ©2016 Elsevier Inc. ° °

## 2015-06-03 NOTE — Progress Notes (Signed)
Cardiology Office Note   Date:  06/04/2015   ID:  [name], DOB [birth year], MRN [id number]  PCP:  [name], MD  Cardiologist:  Dr. Radford Pax  Chief Complaint  Patient presents with  . Hospitalization Follow-up    seen for Dr. Radford Pax  . Palpitations  . Shortness of Breath      History of Present Illness: [name] is a 70 y.o. male who presents for outpatient cardiology office follow-up. Patient has a history of HTN, HLD, PVD, anxiety and anemia. He was seen by Dr. Radford Pax on 04/07/2015 for evaluation of tachycardia. He was seen previously by his PCP in February and noted to have elevated heart rate. EKG showed sinus tachycardia with PACs. He also complaining of dyspnea on exertion. Given abnormal EKG with inferior and anterior infarct, the plan was to proceed with stress Myoview, outpatient echocardiogram and 30 day event monitor to assess for your regular heart rhythm. Echocardiogram obtained on 03/31/2015 showed EF 45-50%, diffuse hypokinesis, peak PA pressure 28 mmHg. Outpatient Myoview obtained on 04/10/2015 showed EF 37%, small defect of moderate severity in apical and apical lateral region consistent with prior MI, no significant reversible ischemia. Given abnormal Myoview, he was agreeable to undergo cardiac catheterization.  He underwent the planned procedure on 05/23/2015 which showed a 10% proximal to mid RCA lesion, mild diffuse hypokinesis with overall EF 45-50% without definitive focal segmental wall motion abnormality. Overall there was no significant coronary artery disease. Outpatient 30 day event monitor showed normal sinus rhythm with very frequent PACs and nonsustained atrial tachycardia. Toprol-XL 25 mg daily was added to his medical regimen. He presents today for cardiology office follow-up.  Patient has been doing relatively well since his cardiac catheterization. Per his wife, he is largely sedentary which is likely responsible for his dyspnea on exertion. His  wife also mentions that he drinks a lot of alcohol, however recently has been cutting back. Having encouraged him to stop drinking alcohol given mildly decreased ejection fraction of the heart. He has not started on metoprolol succinate at this time as local pharmacy was charging him too much. We will send a one-year prescription to his [place]. He never smoked before. On physical exam, there is no obvious pulmonary issue to be responsible for his dyspnea on exertion. I have encouraged increased exercise. He will follow-up with Dr. Radford Pax in 3 month.   Past Medical History  Diagnosis Date  . HLD (hyperlipidemia) 5/99  . HTN (hypertension) 5/99  . BPH (benign prostatic hypertrophy) 5/99  . GERD (gastroesophageal reflux disease) 07/31/04    gastritis   . Anxiety   . Allergy     pollen/ragweed  . Adenomatous polyp of colon 2006  . Hearing difficulty   . Humerus fracture 11/02/2011    LUE  . Peripheral vascular disease ([place])   . Pneumonia 1996    hosp  . Chronic bronchitis ([place])     "q year or q other year" (11/02/2011)  . History of blood transfusion 2012  . Anemia 2012  . Hernia, umbilical     "unrepaired" (11/02/2011)  . H/O hiatal hernia   . Memory loss     "recently has been forgetting alot; maybe related to the alcohol" (11/02/2011)  . Falls frequently     "daily lately; legs just give out" (11/02/2011)  . H/O nausea   . Depression     Hx of  . Shortness of breath     with exertion  .  Seasonal allergies   . Colon polyp   . PAC (premature atrial contraction) 04/07/2015  . Tachycardia 04/07/2015    Past Surgical History  Procedure Laterality Date  . L duputryen contractive surgery    . Neck mri  6/04    C5-6 disc abnormality  . Colonoscopy  07/31/04    multiple polyps; repeat in 3 years  . Hosp cp r/o'd 2/24  03/08/07  . Ett myoview  03/09/07    nml EF 66%  . Upper gastrointestinal endoscopy    . Cataract extraction w/ intraocular lens  implant,  bilateral  10/2002  . Tonsillectomy      "I was young" (11/02/2011)  . Hemorrhoid surgery      "cauterized a long time ago" (11/02/2011)  . Esophageal varice ligation  2012  . Orif humerus fracture  11/04/2011    Procedure: OPEN REDUCTION INTERNAL FIXATION (ORIF) PROXIMAL HUMERUS FRACTURE;  Surgeon: Rozanna Box, MD;  Location: [facility];  Service: Orthopedics;  Laterality: Left;  . Humerus fracture surgery Left 11/04/11    with plate to the shoulder  . Posterior cervical fusion/foraminotomy N/A 03/21/2012    Procedure: POSTERIOR CERVICAL FUSION/FORAMINOTOMY LEVEL ONE;  Surgeon: Charlie Pitter, MD;  Location: [facility];  Service: Neurosurgery;  Laterality: N/A;  POSTERIOR CERVICAL ONE TO CERVICAL TWO FUSION WITH ILIAC CREST GRAFT AND LATERAL MASS SCREWS   . Cardiac catheterization N/A 05/23/2015    Procedure: Left Heart Cath and Coronary Angiography;  Surgeon: Troy Sine, MD;  Location: [facility];  Service: Cardiovascular;  Laterality: N/A;     Current Outpatient Prescriptions  Medication Sig Dispense Refill  . B Complex Vitamins (VITAMIN B COMPLEX PO) Take 1 tablet by mouth daily.     . cetirizine (ZYRTEC) 10 MG tablet Take 10 mg by mouth daily as needed for allergies.    . Magnesium 500 MG TABS Take 500 mg by mouth daily.     . metoprolol succinate (TOPROL-XL) 25 MG 24 hr tablet Take 1 tablet (25 mg total) by mouth daily. 90 tablet 3  . pantoprazole (PROTONIX) 40 MG tablet TAKE 1 TABLET TWICE A DAY 180 tablet 2  . promethazine (PHENERGAN) 25 MG tablet TAKE 1 TABLET DAILY AS NEEDED FOR NAUSEA 90 tablet 0  . QUEtiapine (SEROQUEL) 300 MG tablet TAKE 1 TABLET AT BEDTIME 90 tablet 1  . spironolactone (ALDACTONE) 50 MG tablet Take 1 tablet (50 mg total) by mouth daily. 90 tablet 3  . Thiamine HCl (VITAMIN B-1) 100 MG tablet Take 100 mg by mouth daily. Reported on 02/17/2015    . traZODone (DESYREL) 50 MG tablet Take 1 tablet (50 mg total) by mouth at bedtime. 30 tablet 1  .  triamcinolone cream (KENALOG) 0.1 % Apply 1 application topically daily as needed (itching).   3   No current facility-administered medications for this visit.    Allergies:   Nifedipine    Social History:  The patient  reports that he has never smoked. He has never used smokeless tobacco. He reports that he drinks about 1.2 oz of alcohol per week. He reports that he does not use illicit drugs.   Family History:  The patient's family history includes Alcohol abuse in his brother; Depression in his mother; Diabetes in his father; Heart disease in his brother and father; Liver disease in his brother; Stroke in his father.    ROS:  Please see the history of present illness.   Otherwise, review of systems are  positive for DOE.   All other systems are reviewed and negative.    PHYSICAL EXAM: VS:  BP 128/60 mmHg  Pulse 104  Ht 5\' 9"  (1.753 m)  Wt 201 lb 1.9 oz (91.227 kg)  BMI 29.69 kg/m2 , BMI Body mass index is 29.69 kg/(m^2). GEN: Well nourished, well developed, in no acute distress HEENT: normal Neck: no JVD, carotid bruits, or masses Cardiac: RRR; no murmurs, rubs, or gallops,no edema  Respiratory:  clear to auscultation bilaterally, normal work of breathing GI: soft, nontender, nondistended, + BS MS: no deformity or atrophy Skin: warm and dry, no rash Neuro:  Strength and sensation are intact Psych: euthymic mood, full affect   EKG:  EKG is ordered today. The ekg ordered today demonstrates sinus tach with q wave in inferior and anterior leads   Recent Labs: 03/10/2015: ALT 14 05/16/2015: BUN 7; Creat 0.95; Hemoglobin 12.3*; Platelets 150; Potassium 5.0; Sodium 126*; TSH 1.89    Lipid Panel    Component Value Date/Time   CHOL 150 10/24/2014 0845   TRIG 166.0* 10/24/2014 0845   HDL 49.80 10/24/2014 0845   CHOLHDL 3 10/24/2014 0845   VLDL 33.2 10/24/2014 0845   LDLCALC 67 10/24/2014 0845      Wt Readings from Last 3 Encounters:  06/04/15 201 lb 1.9 oz (91.227 kg)    05/23/15 195 lb (88.451 kg)  05/07/15 206 lb (93.441 kg)      Other studies Reviewed: Additional studies/ records that were reviewed today include:   Myoview 04/10/2015 Study Highlights     Nuclear stress EF: 39%.  There was no ST segment deviation noted during stress.  No T wave inversion was noted during stress.  Defect 1: There is a small defect of moderate severity.  Findings consistent with prior myocardial infarction.  This is an intermediate risk study.  Small size, moderate severity fixed apical/apical lateral defect which is likely scar. No significant reversible ischemia. LVEF 39% with global hypokinesis and apical dyskinesis. This is an intermediate risk study.    Cath 05/23/2015 Conclusion     Prox RCA to Mid RCA lesion, 10% stenosed.  There is mild left ventricular systolic dysfunction.  Mild diffuse hypokinesis with an overall ejection fraction of 45-50% without definitive focal segmental wall motion abnormalities.  No significant coronary obstructive disease with minimal 10 percent narrowing in the proximal RCA, and otherwise normal coronary arteries.    Review of the above records demonstrates:   Patient recently underwent cardiac catheterization for abnormal Myoview. Cardiac catheterization showed minimal CAD.   ASSESSMENT AND PLAN:  1.  Frequent PACs and nonsustained atrial tachycardia: Seen on recent 30 day event monitor.   - He has not started on Toprol-XL, [facility] cost of medication. I have also instructed him to start on Toprol-XL as soon as possible. He will need to monitor his blood pressure, if his blood pressure drops, then we will cut down spironolactone to 25 mg or even 12.5 mg.  - i will hold off on adding ACEI/ARB as his EF > 40%, he is on spironolactone and starting Toprol XL  2. DOE: falsely positive Myoview with minimal CAD on recent cath. Likely result of obesity and  sedentary lifestyle. Encouraged more exercise. He understand the need for low salt diet.   3. HTN: BP stable today 128/60. I have instructed him to monitor blood pressure after starting on Toprol-XL.  4. HLD: He has good HDL, LDL, total  cholesterol on previous check in October 2016, his triglyceride was mildly elevated at 166. He will need at least yearly lipid panel to continue monitor.    Current medicines are reviewed at length with the patient today.  The patient does not have concerns regarding medicines.  The following changes have been made:  no change  Labs/ tests ordered today include:   Orders Placed This Encounter  Procedures  . EKG 12-Lead     Disposition:   FU with Dr. Radford Pax in 3 months  Signed, Almyra Deforest, [place]  06/04/2015 9:00 AM    [facility] [address], [place], [place]  [address] Phone: [phone]; Fax: [phone]

## 2015-06-04 ENCOUNTER — Encounter: Payer: Self-pay | Admitting: Physician Assistant

## 2015-06-04 ENCOUNTER — Ambulatory Visit (INDEPENDENT_AMBULATORY_CARE_PROVIDER_SITE_OTHER): Payer: Medicare Other | Admitting: Physician Assistant

## 2015-06-04 VITALS — BP 128/60 | HR 104 | Ht 69.0 in | Wt 201.1 lb

## 2015-06-04 DIAGNOSIS — R0609 Other forms of dyspnea: Secondary | ICD-10-CM | POA: Diagnosis not present

## 2015-06-04 DIAGNOSIS — I10 Essential (primary) hypertension: Secondary | ICD-10-CM

## 2015-06-04 MED ORDER — METOPROLOL SUCCINATE ER 25 MG PO TB24: 25.0000 mg | ORAL_TABLET | Freq: Every day | ORAL | Status: DC

## 2015-06-04 NOTE — Patient Instructions (Signed)
Medication Instructions:   Your physician recommends that you continue on your current medications as directed. Please refer to the Current Medication list given to you today.  If you need a refill on your cardiac medications before your next appointment, please call your pharmacy.  Labwork: NONE ORDER TODAY   Testing/Procedures: NONE ORDER TODAY    Follow-Up:   DR TURNER IN 3 MONTHS   Any Other Special Instructions Will Be Listed Below (If Applicable).

## 2015-06-24 ENCOUNTER — Other Ambulatory Visit: Payer: Self-pay

## 2015-06-24 MED ORDER — QUETIAPINE FUMARATE 300 MG PO TABS: 300.0000 mg | ORAL_TABLET | Freq: Every day | ORAL | Status: DC

## 2015-06-24 MED ORDER — TRAZODONE HCL 50 MG PO TABS: 50.0000 mg | ORAL_TABLET | Freq: Every day | ORAL | Status: DC

## 2015-06-24 MED ORDER — PROMETHAZINE HCL 25 MG PO TABS: ORAL_TABLET | ORAL | Status: DC

## 2015-06-24 NOTE — Telephone Encounter (Signed)
Pt left note requesting 90 day refills with 3 additional refills on promethazine (last refilled # 90 x 0 on 03/24/15), trazodone (last refilled # 30 x 1 on 04/28/15) and quetiapine (last refilled # 90 x 1 on 04/02/15) pt should still have one refill qu[facility]apine. [facility] Last annual on 10/24/14. I have not changed previous quantities or refills until approved by Dr Deborra Medina.

## 2015-06-26 ENCOUNTER — Encounter: Payer: Self-pay | Admitting: Primary Care

## 2015-06-26 ENCOUNTER — Ambulatory Visit (INDEPENDENT_AMBULATORY_CARE_PROVIDER_SITE_OTHER): Payer: Medicare Other | Admitting: Primary Care

## 2015-06-26 VITALS — BP 136/82 | HR 83 | Temp 97.4°F | Ht 69.0 in | Wt 207.0 lb

## 2015-06-26 DIAGNOSIS — I10 Essential (primary) hypertension: Secondary | ICD-10-CM

## 2015-06-26 MED ORDER — SPIRONOLACTONE 50 MG PO TABS: 50.0000 mg | ORAL_TABLET | Freq: Every day | ORAL | Status: DC

## 2015-06-26 NOTE — Patient Instructions (Signed)
Please continue taking Spironolactone and Metoprolol Succinate tablets for your heart.   Follow up with cardiology as scheduled in August 2017.  It was a pleasure to see you today!

## 2015-06-26 NOTE — Progress Notes (Signed)
Pre visit review using our clinic review tool, if applicable. No additional management support is needed unless otherwise documented below in the visit note. 

## 2015-06-26 NOTE — Progress Notes (Signed)
Subjective:    Patient ID: [name], male    DOB: [birth year], 70 y.o.   MRN: [id number]  HPI  Mr. Brand is a 70 year old male who presents today to discuss medication management.  He is following with cardiology for tachycardia with PAC's and dyspnea upon exertion. He is recently status post cardiac catheterization on 05/23/15 with 10% proximal stenosis to mid RCA lesion with an EF of 45-50%. He was initiated on Toprol XL 25 mg in May per cardiology due to tachycardia and frequent PAC's. He had not began Toprol XL during his most recent cardiology follow up due to cost.  He's since started taking the Toprol XL x 1 week ago. He is unsure if he should be taking both the spirolactone and metoprolol together. He is unsure of the test results from his cardiac catheterization and stress test. He's not been monitoring his blood pressure or heart rate at home. Denies dizziness, chest pain, weakness, shortness of breath at rest.  Review of Systems  Respiratory: Negative for shortness of breath.   Cardiovascular: Negative for chest pain.  Neurological: Negative for dizziness, weakness and headaches.       Past Medical History  Diagnosis Date  . HLD (hyperlipidemia) 5/99  . HTN (hypertension) 5/99  . BPH (benign prostatic hypertrophy) 5/99  . GERD (gastroesophageal reflux disease) 07/31/04    gastritis   . Anxiety   . Allergy     pollen/ragweed  . Adenomatous polyp of colon 2006  . Hearing difficulty   . Humerus fracture 11/02/2011    LUE  . Peripheral vascular disease ([place])   . Pneumonia 1996    hosp  . Chronic bronchitis ([place])     "q year or q other year" (11/02/2011)  . History of blood transfusion 2012  . Anemia 2012  . Hernia, umbilical     "unrepaired" (11/02/2011)  . H/O hiatal hernia   . Memory loss     "recently has been forgetting alot; maybe related to the alcohol" (11/02/2011)  . Falls frequently     "daily lately; legs just give out" (11/02/2011)  . H/O nausea     . Depression     Hx of  . Shortness of breath     with exertion  . Seasonal allergies   . Colon polyp   . PAC (premature atrial contraction) 04/07/2015  . Tachycardia 04/07/2015     Social History   Social History  . Marital Status: Married    Spouse Name: N/A  . Number of Children: 3  . Years of Education: N/A   Occupational History  . color matcher    Social History Main Topics  . Smoking status: Never Smoker   . Smokeless tobacco: Never Used  . Alcohol Use: 1.2 oz/week    2 Glasses of wine per week     Comment: 11/02/2011 "large box of wine q 2 days" 01-27-15 2 glass a day of wine  . Drug Use: No  . Sexual Activity: No   Other Topics Concern  . Not on file   Social History Narrative   Married (remarried), lives with wife; 3 children, 1 at home.    [employer] - mixes colors       Pt revised designated party release form Owen Caughlin, wife [phone] - can leave msg.     Allen Norris 03/04/10.        Would desire CPR.  Would not want prolonged life support if  futile.    Past Surgical History  Procedure Laterality Date  . L duputryen contractive surgery    . Neck mri  6/04    C5-6 disc abnormality  . Colonoscopy  07/31/04    multiple polyps; repeat in 3 years  . Hosp cp r/o'd 2/24  03/08/07  . Ett myoview  03/09/07    nml EF 66%  . Upper gastrointestinal endoscopy    . Cataract extraction w/ intraocular lens  implant, bilateral  10/2002  . Tonsillectomy      "I was young" (11/02/2011)  . Hemorrhoid surgery      "cauterized a long time ago" (11/02/2011)  . Esophageal varice ligation  2012  . Orif humerus fracture  11/04/2011    Procedure: OPEN REDUCTION INTERNAL FIXATION (ORIF) PROXIMAL HUMERUS FRACTURE;  Surgeon: Rozanna Box, MD;  Location: [place];  Service: Orthopedics;  Laterality: Left;  . Humerus fracture surgery Left 11/04/11    with plate to the shoulder  . Posterior cervical fusion/foraminotomy N/A 03/21/2012    Procedure: POSTERIOR  CERVICAL FUSION/FORAMINOTOMY LEVEL ONE;  Surgeon: Charlie Pitter, MD;  Location: [place] [facility];  Service: Neurosurgery;  Laterality: N/A;  POSTERIOR CERVICAL ONE TO CERVICAL TWO FUSION WITH ILIAC CREST GRAFT AND LATERAL MASS SCREWS   . Cardiac catheterization N/A 05/23/2015    Procedure: Left Heart Cath and Coronary Angiography;  Surgeon: Troy Sine, MD;  Location: [place] [facility];  Service: Cardiovascular;  Laterality: N/A;    Family History  Problem Relation Age of Onset  . Heart disease Father   . Diabetes Father   . Stroke Father   . Depression Mother   . Heart disease Brother   . Alcohol abuse Brother   . Liver disease Brother     Allergies  Allergen Reactions  . Nifedipine     REACTION: SWELLING 11/02/2011 pt denies this allergy.    Current Outpatient Prescriptions on File Prior to Visit  Medication Sig Dispense Refill  . B Complex Vitamins (VITAMIN B COMPLEX PO) Take 1 tablet by mouth daily.     . cetirizine (ZYRTEC) 10 MG tablet Take 10 mg by mouth daily as needed for allergies.    . Magnesium 500 MG TABS Take 500 mg by mouth daily.     . metoprolol succinate (TOPROL-XL) 25 MG 24 hr tablet Take 1 tablet (25 mg total) by mouth daily. 90 tablet 3  . pantoprazole (PROTONIX) 40 MG tablet TAKE 1 TABLET TWICE A DAY 180 tablet 2  . promethazine (PHENERGAN) 25 MG tablet TAKE 1 TABLET DAILY AS NEEDED FOR NAUSEA 90 tablet 0  . QUEtiapine (SEROQUEL) 300 MG tablet Take 1 tablet (300 mg total) by mouth at bedtime. 90 tablet 1  . Thiamine HCl (VITAMIN B-1) 100 MG tablet Take 100 mg by mouth daily. Reported on 02/17/2015    . traZODone (DESYREL) 50 MG tablet Take 1 tablet (50 mg total) by mouth at bedtime. 30 tablet 1  . triamcinolone cream (KENALOG) 0.1 % Apply 1 application topically daily as needed (itching).   3   No current facility-administered medications on file prior to visit.    BP 136/82 mmHg  Pulse 83  Temp(Src) 97.4 F (36.3 C) (Oral)  Ht 5\' 9"  (1.753 m)  Wt 207 lb  (93.895 kg)  BMI 30.55 kg/m2  SpO2 98%    Objective:   Physical Exam  Constitutional: He appears well-nourished.  Cardiovascular: Normal rate.   Frequent PAC's noted during auscultation, every 3-4 beats.  Normal rate.  Pulmonary/Chest: Effort normal and breath sounds normal.  Skin: Skin is warm and dry.  Psychiatric: He has a normal mood and affect.          Assessment & Plan:  Medication Management:  Reticent about taking both Spirolactone and Toprol XL.  Did not completely understand his recent test results from cardiology. Discussed results of stress test and cardiac catheterization in "lay-man's" language.  Discussed the importance of taking both medications as prescribed and to monitor his BP and HR. Also mentioned to notify PCP or cardiology if he develops dizziness, weakness, chest pain. He has a much better understanding and had no further questions. Continue all medications as is. He has follow up scheduled with Cardiology in August 2017.  Exam today with frequent PAC's, has not had Toprol XL today.  Sheral Flow, NP

## 2015-07-31 ENCOUNTER — Other Ambulatory Visit: Payer: Self-pay

## 2015-07-31 ENCOUNTER — Telehealth: Payer: Self-pay

## 2015-07-31 DIAGNOSIS — K7030 Alcoholic cirrhosis of liver without ascites: Secondary | ICD-10-CM

## 2015-07-31 NOTE — Telephone Encounter (Signed)
-----   Message from Algernon Huxley, RN sent at 06/18/2015  4:45 PM EDT ----- Regarding: FW: [place] due in July  ----- Message -----    From: Algernon Huxley, RN    Sent: 06/16/2015      To: Algernon Huxley, RN Subject: [place]                                             Repeat US in 6 mth

## 2015-07-31 NOTE — Telephone Encounter (Signed)
Called pt to schedule repeat US of liver for [facility] screening without elastography. Pt does not want to schedule at this time, states he is not sure if he is going to be staying in the area or not. Pt given the phone number for our office and he states he will call us back if he is going to remain here to schedule the [place].

## 2015-08-08 ENCOUNTER — Other Ambulatory Visit: Payer: Self-pay

## 2015-08-08 MED ORDER — TRAZODONE HCL 50 MG PO TABS: 50.0000 mg | ORAL_TABLET | Freq: Every day | ORAL | 1 refills | Status: DC

## 2015-08-08 NOTE — Telephone Encounter (Signed)
Pt left note requesting 90 day refill trazodone with [facility]ills [facility]. Pt has not gotten 2nd refill of trazodone from [facility]. Last refilled # 30 x 1 on 06/24/15. Pt has contacted [facility] but no response yet. Pt last seen 06/26/15. Have not changed quantity until approved by Dr Deborra Medina.

## 2015-08-08 NOTE — Telephone Encounter (Signed)
eRx sent

## 2015-09-04 ENCOUNTER — Ambulatory Visit: Payer: Medicare Other | Admitting: Cardiology

## 2015-09-09 ENCOUNTER — Ambulatory Visit (INDEPENDENT_AMBULATORY_CARE_PROVIDER_SITE_OTHER): Payer: Medicare Other | Admitting: Family Medicine

## 2015-09-09 ENCOUNTER — Telehealth: Payer: Self-pay

## 2015-09-09 ENCOUNTER — Encounter: Payer: Self-pay | Admitting: Family Medicine

## 2015-09-09 VITALS — BP 142/78 | HR 94 | Temp 97.9°F | Wt 193.2 lb

## 2015-09-09 DIAGNOSIS — L03114 Cellulitis of left upper limb: Secondary | ICD-10-CM | POA: Insufficient documentation

## 2015-09-09 DIAGNOSIS — Z23 Encounter for immunization: Secondary | ICD-10-CM | POA: Diagnosis not present

## 2015-09-09 MED ORDER — DOXYCYCLINE HYCLATE 100 MG PO TABS: 100.0000 mg | ORAL_TABLET | Freq: Two times a day (BID) | ORAL | 0 refills | Status: DC

## 2015-09-09 MED ORDER — CYCLOBENZAPRINE HCL 10 MG PO TABS
10.0000 mg | ORAL_TABLET | Freq: Every day | ORAL | 1 refills | Status: DC | PRN
Start: 2015-09-09 — End: 2016-03-05

## 2015-09-09 NOTE — Patient Instructions (Signed)
Good to see you. Please take doxycyline 100 mg twice daily x 10 days.  I would like for you to come see me next week.

## 2015-09-09 NOTE — Assessment & Plan Note (Signed)
New- Doxycyline 100 mg twice daily x 10 days. Follow up with me next week. The patient indicates understanding of these issues and agrees with the plan.

## 2015-09-09 NOTE — Progress Notes (Signed)
Subjective:   Patient ID: [name], male    DOB: [birth year], 70 y.o.   MRN: [id number]  [name] is a pleasant 70 y.o. year old male who presents to clinic today with Arm Pain (bruise and scab from bumping against "something")  on 09/09/2015  HPI:  Rash on left arm- bumped his arm against something a couple of weeks ago, formed a large scab and small area of redness around it.  Now the redness and warmth has spread down the length of his arm.  Tight but not painful. No fevers, chills, nausea or vomiting.  Current Outpatient Prescriptions on File Prior to Visit  Medication Sig Dispense Refill  . B Complex Vitamins (VITAMIN B COMPLEX PO) Take 1 tablet by mouth daily.     . cetirizine (ZYRTEC) 10 MG tablet Take 10 mg by mouth daily as needed for allergies.    . Magnesium 500 MG TABS Take 500 mg by mouth daily.     . metoprolol succinate (TOPROL-XL) 25 MG 24 hr tablet Take 1 tablet (25 mg total) by mouth daily. 90 tablet 3  . pantoprazole (PROTONIX) 40 MG tablet TAKE 1 TABLET TWICE A DAY 180 tablet 2  . promethazine (PHENERGAN) 25 MG tablet TAKE 1 TABLET DAILY AS NEEDED FOR NAUSEA 90 tablet 0  . QUEtiapine (SEROQUEL) 300 MG tablet Take 1 tablet (300 mg total) by mouth at bedtime. 90 tablet 1  . spironolactone (ALDACTONE) 50 MG tablet Take 1 tablet (50 mg total) by mouth daily. 90 tablet 0  . Thiamine HCl (VITAMIN B-1) 100 MG tablet Take 100 mg by mouth daily. Reported on 02/17/2015    . traZODone (DESYREL) 50 MG tablet Take 1 tablet (50 mg total) by mouth at bedtime. 90 tablet 1  . triamcinolone cream (KENALOG) 0.1 % Apply 1 application topically daily as needed (itching).   3   No current facility-administered medications on file prior to visit.     Allergies  Allergen Reactions  . Nifedipine     REACTION: SWELLING 11/02/2011 pt denies this allergy.    Past Medical History:  Diagnosis Date  . Adenomatous polyp of colon 2006  . Allergy    pollen/ragweed  . Anemia 2012    . Anxiety   . BPH (benign prostatic hypertrophy) 5/99  . Chronic bronchitis ([place])    "q year or q other year" (11/02/2011)  . Colon polyp   . Depression    Hx of  . Falls frequently    "daily lately; legs just give out" (11/02/2011)  . GERD (gastroesophageal reflux disease) 07/31/04   gastritis   . H/O hiatal hernia   . H/O nausea   . Hearing difficulty   . Hernia, umbilical    "unrepaired" (11/02/2011)  . History of blood transfusion 2012  . HLD (hyperlipidemia) 5/99  . HTN (hypertension) 5/99  . Humerus fracture 11/02/2011   LUE  . Memory loss    "recently has been forgetting alot; maybe related to the alcohol" (11/02/2011)  . PAC (premature atrial contraction) 04/07/2015  . Peripheral vascular disease ([place])   . Pneumonia 1996   hosp  . Seasonal allergies   . Shortness of breath    with exertion  . Tachycardia 04/07/2015    Past Surgical History:  Procedure Laterality Date  . CARDIAC CATHETERIZATION N/A 05/23/2015   Procedure: Left Heart Cath and Coronary Angiography;  Surgeon: Troy Sine, MD;  Location: [place] [facility];  Service: Cardiovascular;  Laterality: N/A;  .  CATARACT EXTRACTION W/ INTRAOCULAR LENS  IMPLANT, BILATERAL  10/2002  . COLONOSCOPY  07/31/04   multiple polyps; repeat in 3 years  . ESOPHAGEAL VARICE LIGATION  2012  . ETT myoview  03/09/07   nml EF 66%  . HEMORRHOID SURGERY     "cauterized a long time ago" (11/02/2011)  . hosp CP R/O'D 2/24  03/08/07  . HUMERUS FRACTURE SURGERY Left 11/04/11   with plate to the shoulder  . L duputryen contractive surgery    . neck MRI  6/04   C5-6 disc abnormality  . ORIF HUMERUS FRACTURE  11/04/2011   Procedure: OPEN REDUCTION INTERNAL FIXATION (ORIF) PROXIMAL HUMERUS FRACTURE;  Surgeon: Rozanna Box, MD;  Location: [place];  Service: Orthopedics;  Laterality: Left;  . POSTERIOR CERVICAL FUSION/FORAMINOTOMY N/A 03/21/2012   Procedure: POSTERIOR CERVICAL FUSION/FORAMINOTOMY LEVEL ONE;  Surgeon: Charlie Pitter,  MD;  Location: [place] [facility];  Service: Neurosurgery;  Laterality: N/A;  POSTERIOR CERVICAL ONE TO CERVICAL TWO FUSION WITH ILIAC CREST GRAFT AND LATERAL MASS SCREWS   . TONSILLECTOMY     "I was young" (11/02/2011)  . UPPER GASTROINTESTINAL ENDOSCOPY      Family History  Problem Relation Age of Onset  . Heart disease Father   . Diabetes Father   . Stroke Father   . Depression Mother   . Heart disease Brother   . Alcohol abuse Brother   . Liver disease Brother     Social History   Social History  . Marital status: Married    Spouse name: N/A  . Number of children: 3  . 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 Smoking status: Never Smoker  . Smokeless tobacco: Never Used  . Alcohol use 1.2 oz/week    2 Glasses of wine per week     Comment: 11/02/2011 "large box of wine q 2 days" 01-27-15 2 glass a day of wine  . Drug use: No  . Sexual activity: No   Other Topics Concern  . Not on file   Social History Narrative   Married (remarried), lives with wife; 3 children, 1 at home.    [employer] - mixes colors       Pt revised designated party release form Chanz Neiswonger, wife [phone] - can leave msg.     Allen Norris 03/04/10.        Would desire CPR.  Would not want prolonged life support if futile.   The PMH, PSH, Social History, Family History, Medications, and allergies have been reviewed in [facility], and have been updated if relevant.  Review of Systems  Constitutional: Negative.   Gastrointestinal: Negative.   Skin: Positive for color change and rash.  All other systems reviewed and are negative.      Objective:    BP (!) 142/78   Pulse 94   Temp 97.9 F (36.6 C) (Oral)   Wt 193 lb 4 oz (87.7 kg)   SpO2 99%   BMI 28.54 kg/m    Physical Exam  Constitutional: He appears well-developed and well-nourished. No distress.  HENT:  Head: Normocephalic.  Eyes: Conjunctivae are  normal.  Cardiovascular: Normal rate.   Pulmonary/Chest: Effort normal.  Skin: He is not diaphoretic.     Psychiatric: He has a normal mood and affect. His behavior is normal. Judgment and thought content normal.  Nursing note and vitals reviewed.  Assessment & Plan:   Need for influenza vaccination - Plan: Flu Vaccine QUAD 36+ mos PF IM (Fluarix & Fluzone Quad PF)  Cellulitis of left arm No Follow-up on file.

## 2015-09-09 NOTE — Telephone Encounter (Signed)
Mrs Wiess left v/m at 4:58 pm wanting cb about todays visit; I spoke with Mrs Lafary (DPR signed) and she was not able to come with pt today but she wanted Dr Deborra Medina to know pt has had this problem with arm for a long time. She wanted to know what Dr Deborra Medina said at appt; advised that pt has cellulitis of arm and was given abx and to return on 09/07/17at 7:15 for f/u appt. Mrs Radde will plan to come to that appt. Mrs Hladky asked I send note to Dr Deborra Medina about her concerns.

## 2015-09-09 NOTE — Progress Notes (Signed)
Pre visit review using our clinic review tool, if applicable. No additional management support is needed unless otherwise documented below in the visit note. 

## 2015-09-18 ENCOUNTER — Encounter: Payer: Self-pay | Admitting: Family Medicine

## 2015-09-18 ENCOUNTER — Ambulatory Visit (INDEPENDENT_AMBULATORY_CARE_PROVIDER_SITE_OTHER): Payer: Medicare Other | Admitting: Family Medicine

## 2015-09-18 VITALS — BP 108/78 | HR 100 | Wt 190.0 lb

## 2015-09-18 DIAGNOSIS — L03114 Cellulitis of left upper limb: Secondary | ICD-10-CM | POA: Diagnosis not present

## 2015-09-18 NOTE — Assessment & Plan Note (Signed)
Resolving. Finish course of doxycyline. No further rx needed. The patient indicates understanding of these issues and agrees with the plan.

## 2015-09-18 NOTE — Progress Notes (Signed)
Subjective:   Patient ID: [name], male    DOB: Mar 31, 1945, 70 y.o.   MRN: [id number]  [name] is a pleasant 70 y.o. year old male who presents to clinic today with Follow-up  on 09/18/2015  HPI:  Follow up cellulitis- saw him on 09/09/15 after he bumped his arm against something a couple of weeks ago, formed a large scab and small area of redness around it. At that OV,  redness and warmth had spread down the length of his arm.  Tight but not painful. No fevers, chills, nausea or vomiting.  Placed him on doxycyline 100 mg twice daily- last dose today.  Feels much better.  Scab is now open but redness, warmth and tightness have resolved.  Current Outpatient Prescriptions on File Prior to Visit  Medication Sig Dispense Refill  . B Complex Vitamins (VITAMIN B COMPLEX PO) Take 1 tablet by mouth daily.     . cetirizine (ZYRTEC) 10 MG tablet Take 10 mg by mouth daily as needed for allergies.    . cyclobenzaprine (FLEXERIL) 10 MG tablet Take 1 tablet (10 mg total) by mouth daily as needed for muscle spasms. 90 tablet 1  . doxycycline (VIBRA-TABS) 100 MG tablet Take 1 tablet (100 mg total) by mouth 2 (two) times daily. 20 tablet 0  . Magnesium 500 MG TABS Take 500 mg by mouth daily.     . metoprolol succinate (TOPROL-XL) 25 MG 24 hr tablet Take 1 tablet (25 mg total) by mouth daily. 90 tablet 3  . pantoprazole (PROTONIX) 40 MG tablet TAKE 1 TABLET TWICE A DAY 180 tablet 2  . promethazine (PHENERGAN) 25 MG tablet TAKE 1 TABLET DAILY AS NEEDED FOR NAUSEA 90 tablet 0  . QUEtiapine (SEROQUEL) 300 MG tablet Take 1 tablet (300 mg total) by mouth at bedtime. 90 tablet 1  . spironolactone (ALDACTONE) 50 MG tablet Take 1 tablet (50 mg total) by mouth daily. 90 tablet 0  . Thiamine HCl (VITAMIN B-1) 100 MG tablet Take 100 mg by mouth daily. Reported on 02/17/2015    . traZODone (DESYREL) 50 MG tablet Take 1 tablet (50 mg total) by mouth at bedtime. 90 tablet 1  . triamcinolone cream (KENALOG)  0.1 % Apply 1 application topically daily as needed (itching).   3   No current facility-administered medications on file prior to visit.     Allergies  Allergen Reactions  . Nifedipine     REACTION: SWELLING 11/02/2011 pt denies this allergy.    Past Medical History:  Diagnosis Date  . Adenomatous polyp of colon 2006  . Allergy    pollen/ragweed  . Anemia 2012  . Anxiety   . BPH (benign prostatic hypertrophy) 5/99  . Chronic bronchitis ([place])    "q year or q other year" (11/02/2011)  . Colon polyp   . Depression    Hx of  . Falls frequently    "daily lately; legs just give out" (11/02/2011)  . GERD (gastroesophageal reflux disease) 07/31/04   gastritis   . H/O hiatal hernia   . H/O nausea   . Hearing difficulty   . Hernia, umbilical    "unrepaired" (11/02/2011)  . History of blood transfusion 2012  . HLD (hyperlipidemia) 5/99  . HTN (hypertension) 5/99  . Humerus fracture 11/02/2011   LUE  . Memory loss    "recently has been forgetting alot; maybe related to the alcohol" (11/02/2011)  . PAC (premature atrial contraction) 04/07/2015  . Peripheral vascular disease (  [place])   . Pneumonia 1996   hosp  . Seasonal allergies   . Shortness of breath    with exertion  . Tachycardia 04/07/2015    Past Surgical History:  Procedure Laterality Date  . CARDIAC CATHETERIZATION N/A 05/23/2015   Procedure: Left Heart Cath and Coronary Angiography;  Surgeon: Troy Sine, MD;  Location: [place] [facility];  Service: Cardiovascular;  Laterality: N/A;  . CATARACT EXTRACTION W/ INTRAOCULAR LENS  IMPLANT, BILATERAL  10/2002  . COLONOSCOPY  07/31/04   multiple polyps; repeat in 3 years  . ESOPHAGEAL VARICE LIGATION  2012  . ETT myoview  03/09/07   nml EF 66%  . HEMORRHOID SURGERY     "cauterized a long time ago" (11/02/2011)  . hosp CP R/O'D 2/24  03/08/07  . HUMERUS FRACTURE SURGERY Left 11/04/11   with plate to the shoulder  . L duputryen contractive surgery    . neck MRI  6/04     C5-6 disc abnormality  . ORIF HUMERUS FRACTURE  11/04/2011   Procedure: OPEN REDUCTION INTERNAL FIXATION (ORIF) PROXIMAL HUMERUS FRACTURE;  Surgeon: Rozanna Box, MD;  Location: [place];  Service: Orthopedics;  Laterality: Left;  . POSTERIOR CERVICAL FUSION/FORAMINOTOMY N/A 03/21/2012   Procedure: POSTERIOR CERVICAL FUSION/FORAMINOTOMY LEVEL ONE;  Surgeon: Charlie Pitter, MD;  Location: [place] [facility];  Service: Neurosurgery;  Laterality: N/A;  POSTERIOR CERVICAL ONE TO CERVICAL TWO FUSION WITH ILIAC CREST GRAFT AND LATERAL MASS SCREWS   . TONSILLECTOMY     "I was young" (11/02/2011)  . UPPER GASTROINTESTINAL ENDOSCOPY      Family History  Problem Relation Age of Onset  . Heart disease Father   . Diabetes Father   . Stroke Father   . Depression Mother   . Heart disease Brother   . Alcohol abuse Brother   . Liver disease Brother     Social History   Social History  . Marital status: Married    Spouse name: N/A  . Number of children: 3  . 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 Smoking status: Never Smoker  . Smokeless tobacco: Never Used  . Alcohol use 1.2 oz/week    2 Glasses of wine per week     Comment: 11/02/2011 "large box of wine q 2 days" 01-27-15 2 glass a day of wine  . Drug use: No  . Sexual activity: No   Other Topics Concern  . Not on file   Social History Narrative   Married (remarried), lives with wife; 3 children, 1 at home.    [employer] - mixes colors       Pt revised designated party release form Thoams Kerin, wife [phone] - can leave msg.     Allen Norris 03/04/10.        Would desire CPR.  Would not want prolonged life support if futile.   The PMH, PSH, Social History, Family History, Medications, and allergies have been reviewed in [facility], and have been updated if relevant.  Review of Systems  Constitutional: Negative.   Gastrointestinal: Negative.   Skin:  Positive for color change. Negative for rash.  All other systems reviewed and are negative.      Objective:    BP 108/78   Pulse 100   Wt 190 lb (86.2 kg)   SpO2 92%   BMI 28.06 kg/m    Physical Exam  Constitutional: He appears well-developed and well-nourished. No distress.  HENT:  Head: Normocephalic.  Eyes: Conjunctivae are normal.  Cardiovascular: Normal rate.   Pulmonary/Chest: Effort normal.  Skin: He is not diaphoretic.  Redness and warmth resolved, scab open without drainage  Psychiatric: He has a normal mood and affect. His behavior is normal. Judgment and thought content normal.  Nursing note and vitals reviewed.         Assessment & Plan:   Cellulitis of left arm No Follow-up on file.

## 2015-10-29 ENCOUNTER — Other Ambulatory Visit: Payer: Self-pay | Admitting: Family Medicine

## 2015-10-29 DIAGNOSIS — E785 Hyperlipidemia, unspecified: Secondary | ICD-10-CM

## 2015-10-29 DIAGNOSIS — D539 Nutritional anemia, unspecified: Secondary | ICD-10-CM

## 2015-10-29 DIAGNOSIS — Z0000 Encounter for general adult medical examination without abnormal findings: Secondary | ICD-10-CM | POA: Insufficient documentation

## 2015-11-03 ENCOUNTER — Encounter: Payer: Self-pay | Admitting: Cardiology

## 2015-11-03 DIAGNOSIS — I2510 Atherosclerotic heart disease of native coronary artery without angina pectoris: Secondary | ICD-10-CM | POA: Insufficient documentation

## 2015-11-03 DIAGNOSIS — I420 Dilated cardiomyopathy: Secondary | ICD-10-CM | POA: Insufficient documentation

## 2015-11-03 NOTE — Progress Notes (Signed)
Cardiology Office Note    Date:  11/04/2015   ID:  [name], DOB Feb 09, 1945, MRN [id number]  PCP:  [name], MD  Cardiologist:  Fransico Him, MD   Chief Complaint  Patient presents with  . Palpitations  . Coronary Artery Disease  . Hypertension    History of Present Illness:  [name] is a 70 y.o. male who presents for foloowup of tachycardia.  He was seen by his PCP in February of this year and was noted to have an elevated HR.  EKG was done which showed sinus tachycardia with PAC's.  He was not aware of the extra heart beats and denied any palpitations.  He denied chest pain or pressure but admited to DOE when going up and down the stairs.  2D echo showed mild LV dysfunction with EF 45-50%. Nuclear stress test showed Fixed defect in apical and apicolateral defect.  He underwent cath revealing prox 10% RCA lesion with mild LV dysfunction and ow/ normal coronary arteries.  Heart monitor showed very frequent PACs and nonsustained atrial tachycardia and he was started on a BB.  He is now back for followup.  He is doing well.  He denies any chest pain, SOB, DOE (except running up the stairs), LE edema, palpitations, claudication, dizziness (except with getting up too fast) or syncope.    Past Medical History:  Diagnosis Date  . Adenomatous polyp of colon 2006  . Allergy    pollen/ragweed  . Anemia 2012  . Anxiety   . Atrial tachycardia, paroxysmal ([place]) 04/07/2015  . BPH (benign prostatic hypertrophy) 5/99  . Chronic bronchitis ([place])    "q year or q other year" (11/02/2011)  . Colon polyp   . DCM (dilated cardiomyopathy) ([place])    EF 45-50% by echo and cath 2017  . Depression    Hx of  . Falls frequently    "daily lately; legs just give out" (11/02/2011)  . GERD (gastroesophageal reflux disease) 07/31/04   gastritis   . H/O hiatal hernia   . H/O nausea   . Hearing difficulty   . Hernia, umbilical    "unrepaired" (11/02/2011)  . History of blood transfusion 2012   . HLD (hyperlipidemia) 5/99  . HTN (hypertension) 5/99  . Humerus fracture 11/02/2011   LUE  . Memory loss    "recently has been forgetting alot; maybe related to the alcohol" (11/02/2011)  . Mild CAD    10% RCA  . PAC (premature atrial contraction) 04/07/2015  . Peripheral vascular disease ([place])   . Pneumonia 1996   hosp  . Seasonal allergies     Past Surgical History:  Procedure Laterality Date  . CARDIAC CATHETERIZATION N/A 05/23/2015   Procedure: Left Heart Cath and Coronary Angiography;  Surgeon: Troy Sine, MD;  Location: [facility];  Service: Cardiovascular;  Laterality: N/A;  . CATARACT EXTRACTION W/ INTRAOCULAR LENS  IMPLANT, BILATERAL  10/2002  . COLONOSCOPY  07/31/04   multiple polyps; repeat in 3 years  . ESOPHAGEAL VARICE LIGATION  2012  . ETT myoview  03/09/07   nml EF 66%  . HEMORRHOID SURGERY     "cauterized a long time ago" (11/02/2011)  . hosp CP R/O'D 2/24  03/08/07  . HUMERUS FRACTURE SURGERY Left 11/04/11   with plate to the shoulder  . L duputryen contractive surgery    . neck MRI  6/04   C5-6 disc abnormality  . ORIF HUMERUS FRACTURE  11/04/2011   Procedure:  OPEN REDUCTION INTERNAL FIXATION (ORIF) PROXIMAL HUMERUS FRACTURE;  Surgeon: Rozanna Box, MD;  Location: [facility];  Service: Orthopedics;  Laterality: Left;  . POSTERIOR CERVICAL FUSION/FORAMINOTOMY N/A 03/21/2012   Procedure: POSTERIOR CERVICAL FUSION/FORAMINOTOMY LEVEL ONE;  Surgeon: Charlie Pitter, MD;  Location: [facility];  Service: Neurosurgery;  Laterality: N/A;  POSTERIOR CERVICAL ONE TO CERVICAL TWO FUSION WITH ILIAC CREST GRAFT AND LATERAL MASS SCREWS   . TONSILLECTOMY     "I was young" (11/02/2011)  . UPPER GASTROINTESTINAL ENDOSCOPY      Current Medications: Outpatient Medications Prior to Visit  Medication Sig Dispense Refill  . B Complex Vitamins (VITAMIN B COMPLEX PO) Take 1 tablet by mouth daily.     . cetirizine (ZYRTEC) 10 MG tablet Take 10 mg by mouth daily as needed  for allergies.    . cyclobenzaprine (FLEXERIL) 10 MG tablet Take 1 tablet (10 mg total) by mouth daily as needed for muscle spasms. 90 tablet 1  . Magnesium 500 MG TABS Take 500 mg by mouth daily.     . metoprolol succinate (TOPROL-XL) 25 MG 24 hr tablet Take 1 tablet (25 mg total) by mouth daily. 90 tablet 3  . pantoprazole (PROTONIX) 40 MG tablet TAKE 1 TABLET TWICE A DAY 180 tablet 2  . promethazine (PHENERGAN) 25 MG tablet TAKE 1 TABLET DAILY AS NEEDED FOR NAUSEA 90 tablet 0  . QUEtiapine (SEROQUEL) 300 MG tablet Take 1 tablet (300 mg total) by mouth at bedtime. 90 tablet 1  . spironolactone (ALDACTONE) 50 MG tablet Take 1 tablet (50 mg total) by mouth daily. 90 tablet 0  . Thiamine HCl (VITAMIN B-1) 100 MG tablet Take 100 mg by mouth daily. Reported on 02/17/2015    . traZODone (DESYREL) 50 MG tablet Take 1 tablet (50 mg total) by mouth at bedtime. 90 tablet 1  . triamcinolone cream (KENALOG) 0.1 % Apply 1 application topically daily as needed (itching).   3  . doxycycline (VIBRA-TABS) 100 MG tablet Take 1 tablet (100 mg total) by mouth 2 (two) times daily. (Patient not taking: Reported on 11/04/2015) 20 tablet 0   No facility-administered medications prior to visit.      Allergies:   Nifedipine   Social History   Social History  . Marital status: Married    Spouse name: N/A  . Number of children: 3  . 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 Smoking status: Never Smoker  . Smokeless tobacco: Never Used  . Alcohol use 1.2 oz/week    2 Glasses of wine per week     Comment: 11/02/2011 "large box of wine q 2 days" 01-27-15 2 glass a day of wine  . Drug use: No  . Sexual activity: No   Other Topics Concern  . None   Social History Narrative   Married (remarried), lives with wife; 3 children, 1 at home.    [employer] - mixes colors       Pt revised designated party release form Elver Giacona,  wife [phone] - can leave msg.     Allen Norris 03/04/10.        Would desire CPR.  Would not want prolonged life support if futile.     Family History:  The patient's family history includes Alcohol abuse in his brother; Depression in his mother; Diabetes in his father; Heart disease in his brother and father;  Liver disease in his brother; Stroke in his father.   ROS:   Please see the history of present illness.    ROS All other systems reviewed and are negative.  No flowsheet data found.     PHYSICAL EXAM:   VS:  BP 112/66   Pulse 92   Ht 5\' 9"  (1.753 m)   Wt 191 lb 12.8 oz (87 kg)   SpO2 98%   BMI 28.32 kg/m    GEN: Well nourished, well developed, in no acute distress  HEENT: normal  Neck: no JVD, carotid bruits, or masses Cardiac: RRR; no murmurs, rubs, or gallops,no edema.  Intact distal pulses bilaterally.  Respiratory:  clear to auscultation bilaterally, normal work of breathing GI: soft, nontender, nondistended, + BS MS: no deformity or atrophy  Skin: warm and dry, no rash Neuro:  Alert and Oriented x 3, Strength and sensation are intact Psych: euthymic mood, full affect  Wt Readings from Last 3 Encounters:  11/04/15 191 lb 12.8 oz (87 kg)  09/18/15 190 lb (86.2 kg)  09/09/15 193 lb 4 oz (87.7 kg)      Studies/Labs Reviewed:   EKG:  EKG is not ordered today.   Recent Labs: 03/10/2015: ALT 14 05/16/2015: BUN 7; Creat 0.95; Hemoglobin 12.3; Platelets 150; Potassium 5.0; Sodium 126; TSH 1.89   Lipid Panel    Component Value Date/Time   CHOL 150 10/24/2014 0845   TRIG 166.0 (H) 10/24/2014 0845   HDL 49.80 10/24/2014 0845   CHOLHDL 3 10/24/2014 0845   VLDL 33.2 10/24/2014 0845   LDLCALC 67 10/24/2014 0845    Additional studies/ records that were reviewed today include:  Cath, echo and stress test reports    ASSESSMENT:    1. Atrial tachycardia, paroxysmal ([place])   2. Mild CAD   3. DCM (dilated cardiomyopathy) ([place])   4. Essential hypertension     5. Pure hypercholesterolemia      PLAN:  In order of problems listed above:  1. Paroxysmal atrial tachycardia with no breakthrough on BB.  Continue Toprol.  2. Mild ASCAD with 10% RCA and no angina.  Start ASA 81mg  daily.  I will get a copy of  FLP and ALT of PCP. 3. DCM - mild - repeat echo on medical therapy to see if EF has improved on BB therapy. 4. HTN - BP controlled on current meds.  Continue BB and diuretic.  5. Hyperlipidemia - currently not on therapy.  Will check FLP.    Medication Adjustments/Labs and Tests Ordered: Current medicines are reviewed at length with the patient today.  Concerns regarding medicines are outlined above.  Medication changes, Labs and Tests ordered today are listed in the Patient Instructions below.  There are no Patient Instructions on file for this visit.   Signed, Fransico Him, MD  11/04/2015 8:27 AM    [address], [place], [place]  [address] Phone: [phone]; Fax: [phone]

## 2015-11-04 ENCOUNTER — Ambulatory Visit (INDEPENDENT_AMBULATORY_CARE_PROVIDER_SITE_OTHER): Payer: Medicare Other | Admitting: Cardiology

## 2015-11-04 ENCOUNTER — Encounter: Payer: Self-pay | Admitting: Cardiology

## 2015-11-04 ENCOUNTER — Encounter (INDEPENDENT_AMBULATORY_CARE_PROVIDER_SITE_OTHER): Payer: Self-pay

## 2015-11-04 VITALS — BP 112/66 | HR 92 | Ht 69.0 in | Wt 191.8 lb

## 2015-11-04 DIAGNOSIS — I420 Dilated cardiomyopathy: Secondary | ICD-10-CM

## 2015-11-04 DIAGNOSIS — I4719 Other supraventricular tachycardia: Secondary | ICD-10-CM

## 2015-11-04 DIAGNOSIS — I471 Supraventricular tachycardia: Secondary | ICD-10-CM | POA: Diagnosis not present

## 2015-11-04 DIAGNOSIS — I10 Essential (primary) hypertension: Secondary | ICD-10-CM | POA: Diagnosis not present

## 2015-11-04 DIAGNOSIS — I2510 Atherosclerotic heart disease of native coronary artery without angina pectoris: Secondary | ICD-10-CM | POA: Diagnosis not present

## 2015-11-04 DIAGNOSIS — E7800 Pure hypercholesterolemia, unspecified: Secondary | ICD-10-CM

## 2015-11-04 NOTE — Patient Instructions (Signed)

## 2015-11-05 ENCOUNTER — Other Ambulatory Visit (INDEPENDENT_AMBULATORY_CARE_PROVIDER_SITE_OTHER): Payer: Medicare Other

## 2015-11-05 ENCOUNTER — Other Ambulatory Visit: Payer: Medicare Other

## 2015-11-05 ENCOUNTER — Ambulatory Visit (INDEPENDENT_AMBULATORY_CARE_PROVIDER_SITE_OTHER): Payer: Medicare Other

## 2015-11-05 VITALS — BP 112/80 | HR 103 | Temp 98.8°F | Ht 66.0 in | Wt 189.0 lb

## 2015-11-05 DIAGNOSIS — Z0000 Encounter for general adult medical examination without abnormal findings: Secondary | ICD-10-CM

## 2015-11-05 DIAGNOSIS — Z23 Encounter for immunization: Secondary | ICD-10-CM | POA: Diagnosis not present

## 2015-11-05 DIAGNOSIS — D539 Nutritional anemia, unspecified: Secondary | ICD-10-CM

## 2015-11-05 DIAGNOSIS — E785 Hyperlipidemia, unspecified: Secondary | ICD-10-CM

## 2015-11-05 LAB — CBC WITH DIFFERENTIAL/PLATELET
BASOS PCT: 0.4 % (ref 0.0–3.0)
Basophils Absolute: 0 10*3/uL (ref 0.0–0.1)
EOS ABS: 0.1 10*3/uL (ref 0.0–0.7)
Eosinophils Relative: 2.7 % (ref 0.0–5.0)
HCT: 37 % — ABNORMAL LOW (ref 39.0–52.0)
Hemoglobin: 12.3 g/dL — ABNORMAL LOW (ref 13.0–17.0)
Lymphocytes Relative: 16.8 % (ref 12.0–46.0)
Lymphs Abs: 0.7 10*3/uL (ref 0.7–4.0)
MCHC: 33.3 g/dL (ref 30.0–36.0)
MCV: 91.3 fl (ref 78.0–100.0)
MONO ABS: 0.5 10*3/uL (ref 0.1–1.0)
Monocytes Relative: 12.3 % — ABNORMAL HIGH (ref 3.0–12.0)
NEUTROS ABS: 2.8 10*3/uL (ref 1.4–7.7)
Neutrophils Relative %: 67.8 % (ref 43.0–77.0)
PLATELETS: 115 10*3/uL — AB (ref 150.0–400.0)
RBC: 4.05 Mil/uL — ABNORMAL LOW (ref 4.22–5.81)
RDW: 15.5 % (ref 11.5–15.5)
WBC: 4.1 10*3/uL (ref 4.0–10.5)

## 2015-11-05 LAB — LIPID PANEL
CHOL/HDL RATIO: 2
Cholesterol: 190 mg/dL (ref 0–200)
HDL: 118.8 mg/dL (ref >39.00)
LDL CALC: 54 mg/dL (ref 0–99)
NonHDL: 70.81
Triglycerides: 86 mg/dL (ref 0.0–149.0)
VLDL: 17.2 mg/dL (ref 0.0–40.0)

## 2015-11-05 LAB — COMPREHENSIVE METABOLIC PANEL
ALT: 22 U/L (ref 0–53)
AST: 42 U/L — AB (ref 0–37)
Albumin: 3.9 g/dL (ref 3.5–5.2)
Alkaline Phosphatase: 120 U/L — ABNORMAL HIGH (ref 39–117)
BUN: 6 mg/dL (ref 6–23)
CHLORIDE: 89 meq/L — AB (ref 96–112)
CO2: 24 meq/L (ref 19–32)
CREATININE: 0.96 mg/dL (ref 0.40–1.50)
Calcium: 9.3 mg/dL (ref 8.4–10.5)
GFR: 82.49 mL/min (ref >60.00)
GLUCOSE: 112 mg/dL — AB (ref 70–99)
Potassium: 4.7 mEq/L (ref 3.5–5.1)
SODIUM: 126 meq/L — AB (ref 135–145)
Total Bilirubin: 0.9 mg/dL (ref 0.2–1.2)
Total Protein: 6.7 g/dL (ref 6.0–8.3)

## 2015-11-05 LAB — PSA: PSA: 0.7 ng/mL (ref 0.10–4.00)

## 2015-11-05 NOTE — Progress Notes (Signed)
Pre visit review using our clinic review tool, if applicable. No additional management support is needed unless otherwise documented below in the visit note. 

## 2015-11-05 NOTE — Progress Notes (Signed)
PCP notes:   Health maintenance:  PPSV23 - administered  Abnormal screenings:   Hearing - failed Fall risk: Hx of fall with injury Mini-Cog score: 18/20 Depression score: 7  Patient concerns:   Pt had accidental fall on 10/26/15. Pt has increased pain in both knees and tingling in right foot and right leg. Pt suffered bruising to both sides and hips but states the bruising has healed.   Pt has requested refills for Lyrica and Phenergan.   Nurse concerns:  None  Next PCP appt:   Rescheduled CPE from 11/11/15 to 11/06/15 @ 1045 due to pt's recent fall and need for refills.

## 2015-11-05 NOTE — Patient Instructions (Signed)
[name] , Thank you for taking time to come for your Medicare Wellness Visit. I appreciate your ongoing commitment to your health goals. Please review the following plan we discussed and let me know if I can assist you in the future.   These are the goals we discussed: Goals    . Increase water intake          Starting 11/05/2015, I will increase intake of water intake to at least 6 glasses daily.        This is a list of the screening recommended for you and due dates:  Health Maintenance  Topic Date Due  . Colon Cancer Screening  02/17/2020  . Tetanus Vaccine  07/18/2022  . Flu Shot  Completed  . Shingles Vaccine  Completed  .  Hepatitis C: One time screening is recommended by Center for Disease Control  (CDC) for  adults born from [birth year] through 1964.   Completed  . Pneumonia vaccines  Completed   Preventive Care for Adults  A healthy lifestyle and preventive care can promote health and wellness. Preventive health guidelines for adults include the following key practices.  . A routine yearly physical is a good way to check with your health care provider about your health and preventive screening. It is a chance to share any concerns and updates on your health and to receive a thorough exam.  . Visit your dentist for a routine exam and preventive care every 6 months. Brush your teeth twice a day and floss once a day. Good oral hygiene prevents tooth decay and gum disease.  . The frequency of eye exams is based on your age, health, family medical history, use  of contact lenses, and other factors. Follow your health care provider's ecommendations for frequency of eye exams.  . Eat a healthy diet. Foods like vegetables, fruits, whole grains, low-fat dairy products, and lean protein foods contain the nutrients you need without too many calories. Decrease your intake of foods high in solid fats, added sugars, and salt. Eat the right amount of calories for you. Get information about a  proper diet from your health care provider, if necessary.  . Regular physical exercise is one of the most important things you can do for your health. Most adults should get at least 150 minutes of moderate-intensity exercise (any activity that increases your heart rate and causes you to sweat) each week. In addition, most adults need muscle-strengthening exercises on 2 or more days a week.  Silver Sneakers may be a benefit available to you. To determine eligibility, you may visit the website: www.silversneakers.com or contact program at (445)095-5398 Mon-Fri between 8AM-8PM.   . Maintain a healthy weight. The body mass index (BMI) is a screening tool to identify possible weight problems. It provides an estimate of body fat based on height and weight. Your health care provider can find your BMI and can help you achieve or maintain a healthy weight.   For adults 20 years and older: ? A BMI below 18.5 is considered underweight. ? A BMI of 18.5 to 24.9 is normal. ? A BMI of 25 to 29.9 is considered overweight. ? A BMI of 30 and above is considered obese.   . Maintain normal blood lipids and cholesterol levels by exercising and minimizing your intake of saturated fat. Eat a balanced diet with plenty of fruit and vegetables. Blood tests for lipids and cholesterol should begin at age 1 and be repeated every 5 years. If your  lipid or cholesterol levels are high, you are over 50, or you are at high risk for heart disease, you may need your cholesterol levels checked more frequently. Ongoing high lipid and cholesterol levels should be treated with medicines if diet and exercise are not working.  . If you smoke, find out from your health care provider how to quit. If you do not use tobacco, please do not start.  . If you choose to drink alcohol, please do not consume more than 2 drinks per day. One drink is considered to be 12 ounces (355 mL) of beer, 5 ounces (148 mL) of wine, or 1.5 ounces (44 mL) of  liquor.  . If you are 73-87 years old, ask your health care provider if you should take aspirin to prevent strokes.  . Use sunscreen. Apply sunscreen liberally and repeatedly throughout the day. You should seek shade when your shadow is shorter than you. Protect yourself by wearing long sleeves, pants, a wide-brimmed hat, and sunglasses year round, whenever you are outdoors.  . Once a month, do a whole body skin exam, using a mirror to look at the skin on your back. Tell your health care provider of new moles, moles that have irregular borders, moles that are larger than a pencil eraser, or moles that have changed in shape or color.

## 2015-11-05 NOTE — Progress Notes (Signed)
Subjective:   [name] is a 70 y.o. male who presents for Medicare Annual/Subsequent preventive examination.  Review of Systems:  N/A Cardiac Risk Factors include: advanced age (>66men, >44 women);male gender;hypertension;dyslipidemia     Objective:    Vitals: BP 112/80 (BP Location: Left Arm, Patient Position: Sitting, Cuff Size: Normal)   Pulse (!) 103   Temp 98.8 F (37.1 C) (Oral)   Ht 5\' 6"  (1.676 m)   Wt 189 lb (85.7 kg)   SpO2 97%   BMI 30.51 kg/m   Body mass index is 30.51 kg/m.  Tobacco History  Smoking Status  . Never Smoker  Smokeless Tobacco  . Never Used     Counseling given: No   Past Medical History:  Diagnosis Date  . Adenomatous polyp of colon 2006  . Allergy    pollen/ragweed  . Anemia 2012  . Anxiety   . Atrial tachycardia, paroxysmal ([place]) 04/07/2015  . BPH (benign prostatic hypertrophy) 5/99  . Chronic bronchitis ([place])    "q year or q other year" (11/02/2011)  . Colon polyp   . DCM (dilated cardiomyopathy) ([place])    EF 45-50% by echo and cath 2017  . Depression    Hx of  . Falls frequently    "daily lately; legs just give out" (11/02/2011)  . GERD (gastroesophageal reflux disease) 07/31/04   gastritis   . H/O hiatal hernia   . H/O nausea   . Hearing difficulty   . Hernia, umbilical    "unrepaired" (11/02/2011)  . History of blood transfusion 2012  . HLD (hyperlipidemia) 5/99  . HTN (hypertension) 5/99  . Humerus fracture 11/02/2011   LUE  . Memory loss    "recently has been forgetting alot; maybe related to the alcohol" (11/02/2011)  . Mild CAD    10% RCA  . PAC (premature atrial contraction) 04/07/2015  . Peripheral vascular disease ([place])   . Pneumonia 1996   hosp  . Seasonal allergies    Past Surgical History:  Procedure Laterality Date  . CARDIAC CATHETERIZATION N/A 05/23/2015   Procedure: Left Heart Cath and Coronary Angiography;  Surgeon: Troy Sine, MD;  Location: [facility];  Service:  Cardiovascular;  Laterality: N/A;  . CATARACT EXTRACTION W/ INTRAOCULAR LENS  IMPLANT, BILATERAL  10/2002  . COLONOSCOPY  07/31/04   multiple polyps; repeat in 3 years  . ESOPHAGEAL VARICE LIGATION  2012  . ETT myoview  03/09/07   nml EF 66%  . HEMORRHOID SURGERY     "cauterized a long time ago" (11/02/2011)  . hosp CP R/O'D 2/24  03/08/07  . HUMERUS FRACTURE SURGERY Left 11/04/11   with plate to the shoulder  . L duputryen contractive surgery    . neck MRI  6/04   C5-6 disc abnormality  . ORIF HUMERUS FRACTURE  11/04/2011   Procedure: OPEN REDUCTION INTERNAL FIXATION (ORIF) PROXIMAL HUMERUS FRACTURE;  Surgeon: Rozanna Box, MD;  Location: [facility];  Service: Orthopedics;  Laterality: Left;  . POSTERIOR CERVICAL FUSION/FORAMINOTOMY N/A 03/21/2012   Procedure: POSTERIOR CERVICAL FUSION/FORAMINOTOMY LEVEL ONE;  Surgeon: Charlie Pitter, MD;  Location: [facility];  Service: Neurosurgery;  Laterality: N/A;  POSTERIOR CERVICAL ONE TO CERVICAL TWO FUSION WITH ILIAC CREST GRAFT AND LATERAL MASS SCREWS   . TONSILLECTOMY     "I was young" (11/02/2011)  . UPPER GASTROINTESTINAL ENDOSCOPY     Family History  Problem Relation Age of Onset  . Heart disease Father   .  Diabetes Father   . Stroke Father   . Depression Mother   . Heart disease Brother   . Alcohol abuse Brother   . Liver disease Brother    History  Sexual Activity  . Sexual activity: No    Outpatient Encounter Prescriptions as of 11/05/2015  Medication Sig  . B Complex Vitamins (VITAMIN B COMPLEX PO) Take 1 tablet by mouth daily.   . cetirizine (ZYRTEC) 10 MG tablet Take 10 mg by mouth daily as needed for allergies.  . cyclobenzaprine (FLEXERIL) 10 MG tablet Take 1 tablet (10 mg total) by mouth daily as needed for muscle spasms.  . Magnesium 500 MG TABS Take 500 mg by mouth daily.   . metoprolol succinate (TOPROL-XL) 25 MG 24 hr tablet Take 1 tablet (25 mg total) by mouth daily.  . pantoprazole (PROTONIX) 40 MG tablet TAKE 1  TABLET TWICE A DAY  . promethazine (PHENERGAN) 25 MG tablet TAKE 1 TABLET DAILY AS NEEDED FOR NAUSEA  . QUEtiapine (SEROQUEL) 300 MG tablet Take 1 tablet (300 mg total) by mouth at bedtime.  [name] spironolactone (ALDACTONE) 50 MG tablet Take 1 tablet (50 mg total) by mouth daily.  . Thiamine HCl (VITAMIN B-1) 100 MG tablet Take 100 mg by mouth daily. Reported on 02/17/2015  . traZODone (DESYREL) 50 MG tablet Take 1 tablet (50 mg total) by mouth at bedtime.  . triamcinolone cream (KENALOG) 0.1 % Apply 1 application topically daily as needed (itching).    No facility-administered encounter medications on file as of 11/05/2015.     Activities of Daily Living In your present state of health, do you have any difficulty performing the following activities: 11/05/2015 05/23/2015  Hearing? Y N  Vision? Y N  Difficulty concentrating or making decisions? Y N  Walking or climbing stairs? N N  Dressing or bathing? Y -  Doing errands, shopping? N -  Preparing Food and eating ? N -  Using the Toilet? N -  In the past six months, have you accidently leaked urine? N -  Do you have problems with loss of bowel control? N -  Managing your Medications? N -  Managing your Finances? N -  Housekeeping or managing your Housekeeping? N -  Some recent data might be hidden    Patient Care Team: Lucille Passy, MD as PCP - General [place], MD as Consulting Physician (Orthopedic Surgery) Earnie Larsson, MD as Consulting Physician (Neurosurgery) Owens Loffler, MD as Consulting Physician (Family Medicine) Jerene Bears, MD as Consulting Physician (Gastroenterology) Thelma Comp, OD as Consulting Physician (Optometry)   Assessment:     Hearing Screening   125Hz  250Hz  500Hz  1000Hz  2000Hz  3000Hz  4000Hz  6000Hz  8000Hz   Right ear:   0 0 0  0    Left ear:   0 0 0  0    Vision Screening Comments: Last vision exam in 2016 @ [facility]   Exercise Activities and Dietary recommendations Current Exercise  Habits: The patient does not participate in regular exercise at present, Exercise limited by: None identified  Goals    . Increase water intake          Starting 11/05/2015, I will increase intake of water intake to at least 6 glasses daily.       Fall Risk Fall Risk  11/05/2015 10/24/2014  Falls in the past year? Yes Yes  Number falls in past yr: 1 1  Injury with Fall? Yes No  Follow up Falls evaluation completed;Falls prevention  discussed -   Depression Screen PHQ 2/9 Scores 11/05/2015 10/24/2014  PHQ - 2 Score 3 0  PHQ- 9 Score 7 -    Cognitive Function MMSE - Mini Mental State Exam 11/05/2015  Orientation to time 5  Orientation to Place 5  Registration 3  Attention/ Calculation 0  Recall 1  Recall-comments pt was unable to recall 2 of 3 words  Language- name 2 objects 0  Language- repeat 1  Language- follow 3 step command 3  Language- read & follow direction 0  Write a sentence 0  Copy design 0  Total score 18     PLEASE NOTE: A Mini-Cog screen was completed. Maximum score is 20. A value of 0 denotes this part of [place] MMSE was not completed or the patient failed this part of the Mini-Cog screening.   Mini-Cog Screening Orientation to Time - Max 5 pts Orientation to Place - Max 5 pts Registration - Max 3 pts Recall - Max 3 pts Language Repeat - Max 1 pts Language Follow 3 Step Command - Max 3 pts     Immunization History  Administered Date(s) Administered  . Influenza Whole 10/20/2000, 10/17/2008, 10/09/2009  . Influenza,inj,Quad PF,36+ Mos 10/04/2013, 10/24/2014, 09/09/2015  . Pneumococcal Conjugate-13 10/04/2013  . Pneumococcal Polysaccharide-23 11/18/1999, 11/05/2015  . Td 02/22/2003  . Tdap 07/17/2012  . Zoster 05/12/2012   Screening Tests Health Maintenance  Topic Date Due  . COLONOSCOPY  02/17/2020  . TETANUS/TDAP  07/18/2022  . INFLUENZA VACCINE  Completed  . ZOSTAVAX  Completed  . Hepatitis C Screening  Completed  . PNA vac Low Risk  Adult  Completed      Plan:     I have personally reviewed and addressed the Medicare Annual Wellness questionnaire and have noted the following in the patient's chart:  A. Medical and social history B. Use of alcohol, tobacco or illicit drugs  C. Current medications and supplements D. Functional ability and status E.  Nutritional status F.  Physical activity G. Advance directives H. List of other physicians I.  Hospitalizations, surgeries, and ER visits in previous 12 months J.  [place] to include hearing, vision, cognitive, depression L. Referrals and appointments - none  In addition, I have reviewed and discussed with patient certain preventive protocols, quality metrics, and best practice recommendations. A written personalized care plan for preventive services as well as general preventive health recommendations were provided to patient.  See attached scanned questionnaire for additional information.   Signed,   Lindell Noe, MHA, BS, LPN Health Coach

## 2015-11-06 ENCOUNTER — Ambulatory Visit: Payer: Medicare Other | Admitting: Family Medicine

## 2015-11-06 NOTE — Progress Notes (Signed)
I reviewed health advisor's note, was available for consultation, and agree with documentation and plan.  

## 2015-11-11 ENCOUNTER — Encounter: Payer: Medicare Other | Admitting: Family Medicine

## 2015-11-13 ENCOUNTER — Other Ambulatory Visit: Payer: Self-pay | Admitting: Primary Care

## 2015-11-13 DIAGNOSIS — I10 Essential (primary) hypertension: Secondary | ICD-10-CM

## 2015-11-20 ENCOUNTER — Ambulatory Visit (INDEPENDENT_AMBULATORY_CARE_PROVIDER_SITE_OTHER): Payer: Medicare Other | Admitting: Family Medicine

## 2015-11-20 ENCOUNTER — Encounter: Payer: Self-pay | Admitting: Family Medicine

## 2015-11-20 VITALS — BP 112/60 | HR 74 | Temp 97.4°F | Wt 186.8 lb

## 2015-11-20 DIAGNOSIS — K7031 Alcoholic cirrhosis of liver with ascites: Secondary | ICD-10-CM

## 2015-11-20 DIAGNOSIS — E7800 Pure hypercholesterolemia, unspecified: Secondary | ICD-10-CM

## 2015-11-20 DIAGNOSIS — Z8719 Personal history of other diseases of the digestive system: Secondary | ICD-10-CM

## 2015-11-20 DIAGNOSIS — I420 Dilated cardiomyopathy: Secondary | ICD-10-CM

## 2015-11-20 DIAGNOSIS — I2510 Atherosclerotic heart disease of native coronary artery without angina pectoris: Secondary | ICD-10-CM

## 2015-11-20 DIAGNOSIS — M1990 Unspecified osteoarthritis, unspecified site: Secondary | ICD-10-CM | POA: Insufficient documentation

## 2015-11-20 DIAGNOSIS — I471 Supraventricular tachycardia: Secondary | ICD-10-CM

## 2015-11-20 DIAGNOSIS — F321 Major depressive disorder, single episode, moderate: Secondary | ICD-10-CM | POA: Diagnosis not present

## 2015-11-20 MED ORDER — TRAMADOL HCL 50 MG PO TABS: 50.0000 mg | ORAL_TABLET | Freq: Three times a day (TID) | ORAL | 0 refills | Status: DC | PRN

## 2015-11-20 MED ORDER — QUETIAPINE FUMARATE 300 MG PO TABS: 300.0000 mg | ORAL_TABLET | Freq: Every day | ORAL | 1 refills | Status: DC

## 2015-11-20 NOTE — Progress Notes (Signed)
Subjective:   Patient ID: [name], male    DOB: 27-Jan-1945, 70 y.o.   MRN: [id number]  [name] is a pleasant 70 y.o. year old male who presents to clinic today with Follow-up and Fall (4wks ago)  on 11/20/2015  HPI:  Annual wellness visit with Candis Musa, RN on 11/05/15 Note reviewed.  Fell 4 weeks ago- slipped and fell in the shower.  Hit his knees and the back of his head.  Feeling ok now.  Still having a lot of pain in his right knee.  Also has chronic pain in his feet.  Atrial tachycardia- Saw cardiology on 11/04/15, Dr. Radford Pax. Note reviewed.  2D echo showed mild LV dysfunction with EF 45-50%. Nuclear stress test showed Fixed defect in apical and apicolateral defect.  He underwent cath revealing prox 10% RCA lesion with mild LV dysfunction and ow/ normal coronary arteries.  Heart monitor showed very frequent PACs and nonsustained atrial tachycardia and he was started on a BB.  Advised to continue Toprol, start ASA 81 mg daily.  Lab Results  Component Value Date   CHOL 190 11/05/2015   HDL 118.80 11/05/2015   LDLCALC 54 11/05/2015   TRIG 86.0 11/05/2015   CHOLHDL 2 11/05/2015   Lab Results  Component Value Date   PSA 0.70 11/05/2015   PSA 1.25 10/24/2014   PSA 1.52 [id number]    Alcoholic liver cirhossis- continues to take aldactone and Thiamine.  Unfortunately still drinking. Lab Results  Component Value Date   ALT 22 11/05/2015   AST 42 (H) 11/05/2015   ALKPHOS 120 (H) 11/05/2015   BILITOT 0.9 11/05/2015   GERD- has been controlled with protonix.  Depression/insomnia- improved with trazodone and seroquel (started in rehab).  Current Outpatient Prescriptions on File Prior to Visit  Medication Sig Dispense Refill  . B Complex Vitamins (VITAMIN B COMPLEX PO) Take 1 tablet by mouth daily.     . cetirizine (ZYRTEC) 10 MG tablet Take 10 mg by mouth daily as needed for allergies.    . cyclobenzaprine (FLEXERIL) 10 MG tablet Take 1 tablet (10 mg  total) by mouth daily as needed for muscle spasms. 90 tablet 1  . Magnesium 500 MG TABS Take 500 mg by mouth daily.     . metoprolol succinate (TOPROL-XL) 25 MG 24 hr tablet Take 1 tablet (25 mg total) by mouth daily. 90 tablet 3  . pantoprazole (PROTONIX) 40 MG tablet TAKE 1 TABLET TWICE A DAY 180 tablet 2  . promethazine (PHENERGAN) 25 MG tablet TAKE 1 TABLET DAILY AS NEEDED FOR NAUSEA 90 tablet 0  . spironolactone (ALDACTONE) 50 MG tablet TAKE 1 TABLET DAILY 90 tablet 1  . Thiamine HCl (VITAMIN B-1) 100 MG tablet Take 100 mg by mouth daily. Reported on 02/17/2015    . traZODone (DESYREL) 50 MG tablet Take 1 tablet (50 mg total) by mouth at bedtime. 90 tablet 1  . triamcinolone cream (KENALOG) 0.1 % Apply 1 application topically daily as needed (itching).   3   No current facility-administered medications on file prior to visit.     Allergies  Allergen Reactions  . Nifedipine     REACTION: SWELLING 11/02/2011 pt denies this allergy.    Past Medical History:  Diagnosis Date  . Adenomatous polyp of colon 2006  . Allergy    pollen/ragweed  . Anemia 2012  . Anxiety   . Atrial tachycardia, paroxysmal ([place]) 04/07/2015  . BPH (benign prostatic hypertrophy) 5/99  . Chronic  bronchitis ([place])    "q year or q other year" (11/02/2011)  . Colon polyp   . DCM (dilated cardiomyopathy) ([place])    EF 45-50% by echo and cath 2017  . Depression    Hx of  . Falls frequently    "daily lately; legs just give out" (11/02/2011)  . GERD (gastroesophageal reflux disease) 07/31/04   gastritis   . H/O hiatal hernia   . H/O nausea   . Hearing difficulty   . Hernia, umbilical    "unrepaired" (11/02/2011)  . History of blood transfusion 2012  . HLD (hyperlipidemia) 5/99  . HTN (hypertension) 5/99  . Humerus fracture 11/02/2011   LUE  . Memory loss    "recently has been forgetting alot; maybe related to the alcohol" (11/02/2011)  . Mild CAD    10% RCA  . PAC (premature atrial contraction) 04/07/2015   . Peripheral vascular disease ([place])   . Pneumonia 1996   hosp  . Seasonal allergies     Past Surgical History:  Procedure Laterality Date  . CARDIAC CATHETERIZATION N/A 05/23/2015   Procedure: Left Heart Cath and Coronary Angiography;  Surgeon: Troy Sine, MD;  Location: [facility];  Service: Cardiovascular;  Laterality: N/A;  . CATARACT EXTRACTION W/ INTRAOCULAR LENS  IMPLANT, BILATERAL  10/2002  . COLONOSCOPY  07/31/04   multiple polyps; repeat in 3 years  . ESOPHAGEAL VARICE LIGATION  2012  . ETT myoview  03/09/07   nml EF 66%  . HEMORRHOID SURGERY     "cauterized a long time ago" (11/02/2011)  . hosp CP R/O'D 2/24  03/08/07  . HUMERUS FRACTURE SURGERY Left 11/04/11   with plate to the shoulder  . L duputryen contractive surgery    . neck MRI  6/04   C5-6 disc abnormality  . ORIF HUMERUS FRACTURE  11/04/2011   Procedure: OPEN REDUCTION INTERNAL FIXATION (ORIF) PROXIMAL HUMERUS FRACTURE;  Surgeon: Rozanna Box, MD;  Location: [facility];  Service: Orthopedics;  Laterality: Left;  . POSTERIOR CERVICAL FUSION/FORAMINOTOMY N/A 03/21/2012   Procedure: POSTERIOR CERVICAL FUSION/FORAMINOTOMY LEVEL ONE;  Surgeon: Charlie Pitter, MD;  Location: [facility];  Service: Neurosurgery;  Laterality: N/A;  POSTERIOR CERVICAL ONE TO CERVICAL TWO FUSION WITH ILIAC CREST GRAFT AND LATERAL MASS SCREWS   . TONSILLECTOMY     "I was young" (11/02/2011)  . UPPER GASTROINTESTINAL ENDOSCOPY      Family History  Problem Relation Age of Onset  . Heart disease Father   . Diabetes Father   . Stroke Father   . Depression Mother   . Heart disease Brother   . Alcohol abuse Brother   . Liver disease Brother     Social History   Social History  . Marital status: Married    Spouse name: N/A  . Number of children: 3  . 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 Smoking status: Never Smoker  . Smokeless tobacco: Never  Used  . Alcohol use 1.2 oz/week    2 Glasses of wine per week     Comment: 11/02/2011 "large box of wine q 2 days" 01-27-15 2 glass a day of wine  . Drug use: No  . Sexual activity: No   Other Topics Concern  . Not on file   Social History Narrative   Married (remarried), lives with wife; 3 children, 1 at home.    [employer] - mixes colors       Pt revised designated party release form Ritik Whitehorn, wife [phone] - can leave msg.     Allen Norris 03/04/10.        Would desire CPR.  Would not want prolonged life support if futile.   The PMH, PSH, Social History, Family History, Medications, and allergies have been reviewed in [facility], and have been updated if relevant.    Review of Systems  HENT: Negative.   Eyes: Negative.   Respiratory: Negative.   Cardiovascular: Negative.   Endocrine: Negative.   Genitourinary: Negative.   Musculoskeletal: Positive for arthralgias. Negative for back pain and gait problem.  Allergic/Immunologic: Negative.   Neurological: Positive for weakness. Negative for tremors, seizures, syncope, facial asymmetry, speech difficulty, light-headedness, numbness and headaches.  Hematological: Negative.   Psychiatric/Behavioral: Negative.   All other systems reviewed and are negative.      Objective:    BP 112/60   Pulse 74   Temp 97.4 F (36.3 C) (Oral)   Wt 186 lb 12 oz (84.7 kg)   SpO2 97%   BMI 30.14 kg/m    Physical Exam  General:  pleasant male in no acute distress Eyes:  PERRL Ears:  External ear exam shows no significant lesions or deformities.  TMs normal bilaterally Hearing is grossly normal bilaterally. Nose:  External nasal examination shows no deformity or inflammation. Nasal mucosa are pink and moist without lesions or exudates. Mouth:  Oral mucosa and oropharynx without lesions or exudates.  Teeth in good repair. Neck:  no carotid bruit or thyromegaly no cervical or supraclavicular lymphadenopathy  Lungs:  Normal  respiratory effort, chest expands symmetrically. Lungs are clear to auscultation, no crackles or wheezes. Heart:  Normal rate and regular rhythm. S1 and S2 normal without gallop, murmur, click, rub or other extra sounds. Abdomen:  Bowel sounds positive,abdomen soft and non-tender without masses, organomegaly or hernias noted. Pulses:  R and L posterior tibial pulses are full and equal bilaterally  Extremities:  no edema  + right knee- some decreased ROM, no effusion Psych:  Good eye contact, not anxious or depressed appearing        Assessment & Plan:   Alcoholic cirrhosis of liver with ascites (HCC)  Moderate single current episode of major depressive disorder (HCC)  H/O: upper GI bleed  Pure hypercholesterolemia  DCM (dilated cardiomyopathy) (HCC)  Atrial tachycardia, paroxysmal ([place]) No Follow-up on file.

## 2015-11-20 NOTE — Assessment & Plan Note (Signed)
Unfortunately still drinking. Denies having a problem.  Does not want to attend meetings. Continue aldactone. Liver function stable.

## 2015-11-20 NOTE — Assessment & Plan Note (Signed)
Well controlled on seroquel and trazodone.

## 2015-11-20 NOTE — Patient Instructions (Signed)
Great to see you.  Try tramadol for pain- this will make you sleepy and can be habit forming.

## 2015-11-20 NOTE — Assessment & Plan Note (Signed)
Controlled with toprol. Managed by cardiology. No changes made today.

## 2015-11-20 NOTE — Assessment & Plan Note (Signed)
Deteriorated. He cannot increase dose of tylenol and needs to use with caution given cirhossis. Given rx for tramadol- advised to use with caution- will increase fall risk. The patient indicates understanding of these issues and agrees with the plan.

## 2015-11-20 NOTE — Progress Notes (Signed)
Pre visit review using our clinic review tool, if applicable. No additional management support is needed unless otherwise documented below in the visit note. 

## 2015-11-20 NOTE — Assessment & Plan Note (Signed)
Lipid panel looks great without rx

## 2015-12-01 ENCOUNTER — Other Ambulatory Visit: Payer: Self-pay | Admitting: Family Medicine

## 2015-12-08 ENCOUNTER — Telehealth: Payer: Self-pay | Admitting: Family Medicine

## 2015-12-08 NOTE — Telephone Encounter (Signed)
[facility]         Patient Name: [name]  DOB: 1945/06/21    Initial Comment Caller states he has vertigo, bumping into walls and door ways. Fell last week. Dizziness for a couple of weeks.    Nurse Assessment      Guidelines    Guideline Title Affirmed Question Affirmed Notes  Dizziness - Vertigo [1] Dizziness (vertigo) present now AND [2] one or more stroke risk factors (i.e., hypertension, diabetes, prior stroke/TIA/heart attack) (Exception: prior physician evaluation for this AND no different/worse than usual)    Final Disposition User   Go to ED Now (or PCP triage) Verlin Fester, RN, [facility]    Comments  After triage patient refuses to go to ED or to be scheduled for appt today. States he has no transportation today. Asked for early am appt tomorrow   Disagree/Comply: Comply

## 2015-12-08 NOTE — Telephone Encounter (Signed)
Pt has appt with Dr Deborra Medina on 12/09/15 at 7:15.

## 2015-12-09 ENCOUNTER — Other Ambulatory Visit: Payer: Self-pay | Admitting: Family Medicine

## 2015-12-09 ENCOUNTER — Encounter: Payer: Self-pay | Admitting: Family Medicine

## 2015-12-09 ENCOUNTER — Ambulatory Visit (INDEPENDENT_AMBULATORY_CARE_PROVIDER_SITE_OTHER): Payer: Medicare Other | Admitting: Family Medicine

## 2015-12-09 ENCOUNTER — Telehealth: Payer: Self-pay | Admitting: *Deleted

## 2015-12-09 VITALS — BP 116/72 | HR 89 | Temp 98.2°F | Wt 184.3 lb

## 2015-12-09 DIAGNOSIS — R42 Dizziness and giddiness: Secondary | ICD-10-CM

## 2015-12-09 DIAGNOSIS — G894 Chronic pain syndrome: Secondary | ICD-10-CM

## 2015-12-09 DIAGNOSIS — I2510 Atherosclerotic heart disease of native coronary artery without angina pectoris: Secondary | ICD-10-CM

## 2015-12-09 LAB — CBC WITH DIFFERENTIAL/PLATELET
BASOS ABS: 0 10*3/uL (ref 0.0–0.1)
Basophils Relative: 0.6 % (ref 0.0–3.0)
EOS PCT: 2.8 % (ref 0.0–5.0)
Eosinophils Absolute: 0.2 10*3/uL (ref 0.0–0.7)
HEMATOCRIT: 35.4 % — AB (ref 39.0–52.0)
Hemoglobin: 12 g/dL — ABNORMAL LOW (ref 13.0–17.0)
LYMPHS ABS: 0.8 10*3/uL (ref 0.7–4.0)
LYMPHS PCT: 11.6 % — AB (ref 12.0–46.0)
MCHC: 33.9 g/dL (ref 30.0–36.0)
MCV: 92.7 fl (ref 78.0–100.0)
MONOS PCT: 16.2 % — AB (ref 3.0–12.0)
Monocytes Absolute: 1.1 10*3/uL — ABNORMAL HIGH (ref 0.1–1.0)
Neutro Abs: 4.8 10*3/uL (ref 1.4–7.7)
Neutrophils Relative %: 68.8 % (ref 43.0–77.0)
Platelets: 151 10*3/uL (ref 150.0–400.0)
RBC: 3.82 Mil/uL — AB (ref 4.22–5.81)
RDW: 16 % — ABNORMAL HIGH (ref 11.5–15.5)
WBC: 6.9 10*3/uL (ref 4.0–10.5)

## 2015-12-09 LAB — COMPREHENSIVE METABOLIC PANEL
ALBUMIN: 3.8 g/dL (ref 3.5–5.2)
ALK PHOS: 104 U/L (ref 39–117)
ALT: 15 U/L (ref 0–53)
AST: 27 U/L (ref 0–37)
BILIRUBIN TOTAL: 0.6 mg/dL (ref 0.2–1.2)
BUN: 10 mg/dL (ref 6–23)
CALCIUM: 9.6 mg/dL (ref 8.4–10.5)
CO2: 23 mEq/L (ref 19–32)
Chloride: 91 mEq/L — ABNORMAL LOW (ref 96–112)
Creatinine, Ser: 1.23 mg/dL (ref 0.40–1.50)
GFR: 61.95 mL/min (ref >60.00)
Glucose, Bld: 101 mg/dL — ABNORMAL HIGH (ref 70–99)
POTASSIUM: 4.5 meq/L (ref 3.5–5.1)
Sodium: 126 mEq/L — ABNORMAL LOW (ref 135–145)
TOTAL PROTEIN: 6.8 g/dL (ref 6.0–8.3)

## 2015-12-09 MED ORDER — PROMETHAZINE HCL 25 MG PO TABS: ORAL_TABLET | ORAL | 0 refills | Status: DC

## 2015-12-09 NOTE — Progress Notes (Signed)
Subjective:   Patient ID: [name], male    DOB: 30-Aug-1945, 70 y.o.   MRN: [id number]  [name] is a pleasant 70 y.o. year old male who presents to clinic today with Dizziness  on 12/09/2015  HPI:  Has been dizzy when changing head positions for past several weeks.  History of recurrent falls and ETOH abuse/alcoholic cirrhosis.  Last fell a few weeks ago.  Chronic pain in left shoulder and neck.  Tramadol has not been effective.  Asks for something stronger.  Lab Results  Component Value Date   ALT 22 11/05/2015   AST 42 (H) 11/05/2015   ALKPHOS 120 (H) 11/05/2015   BILITOT 0.9 11/05/2015   Lab Results  Component Value Date   WBC 4.1 11/05/2015   HGB 12.3 (L) 11/05/2015   HCT 37.0 (L) 11/05/2015   MCV 91.3 11/05/2015   PLT 115.0 (L) 11/05/2015   Lab Results  Component Value Date   NA 126 (L) 11/05/2015   K 4.7 11/05/2015   CL 89 (L) 11/05/2015   CO2 24 11/05/2015   Lab Results  Component Value Date   CREATININE 0.96 11/05/2015    Current Outpatient Prescriptions on File Prior to Visit  Medication Sig Dispense Refill  . B Complex Vitamins (VITAMIN B COMPLEX PO) Take 1 tablet by mouth daily.     . cetirizine (ZYRTEC) 10 MG tablet Take 10 mg by mouth daily as needed for allergies.    . cyclobenzaprine (FLEXERIL) 10 MG tablet Take 1 tablet (10 mg total) by mouth daily as needed for muscle spasms. 90 tablet 1  . Magnesium 500 MG TABS Take 500 mg by mouth daily.     . metoprolol succinate (TOPROL-XL) 25 MG 24 hr tablet Take 1 tablet (25 mg total) by mouth daily. 90 tablet 3  . pantoprazole (PROTONIX) 40 MG tablet TAKE 1 TABLET TWICE A DAY 180 tablet 2  . QUEtiapine (SEROQUEL) 300 MG tablet Take 1 tablet (300 mg total) by mouth at bedtime. 90 tablet 1  . spironolactone (ALDACTONE) 50 MG tablet TAKE 1 TABLET DAILY 90 tablet 1  . Thiamine HCl (VITAMIN B-1) 100 MG tablet Take 100 mg by mouth daily. Reported on 02/17/2015    . traZODone (DESYREL) 50 MG tablet  Take 1 tablet (50 mg total) by mouth at bedtime. 90 tablet 1  . triamcinolone cream (KENALOG) 0.1 % Apply 1 application topically daily as needed (itching).   3   No current facility-administered medications on file prior to visit.     Allergies  Allergen Reactions  . Nifedipine     REACTION: SWELLING 11/02/2011 pt denies this allergy.    Past Medical History:  Diagnosis Date  . Adenomatous polyp of colon 2006  . Allergy    pollen/ragweed  . Anemia 2012  . Anxiety   . Atrial tachycardia, paroxysmal ([place]) 04/07/2015  . BPH (benign prostatic hypertrophy) 5/99  . Chronic bronchitis ([place])    "q year or q other year" (11/02/2011)  . Colon polyp   . DCM (dilated cardiomyopathy) ([place])    EF 45-50% by echo and cath 2017  . Depression    Hx of  . Falls frequently    "daily lately; legs just give out" (11/02/2011)  . GERD (gastroesophageal reflux disease) 07/31/04   gastritis   . H/O hiatal hernia   . H/O nausea   . Hearing difficulty   . Hernia, umbilical    "unrepaired" (11/02/2011)  . History of blood  transfusion 2012  . HLD (hyperlipidemia) 5/99  . HTN (hypertension) 5/99  . Humerus fracture 11/02/2011   LUE  . Memory loss    "recently has been forgetting alot; maybe related to the alcohol" (11/02/2011)  . Mild CAD    10% RCA  . PAC (premature atrial contraction) 04/07/2015  . Peripheral vascular disease ([place])   . Pneumonia 1996   hosp  . Seasonal allergies     Past Surgical History:  Procedure Laterality Date  . CARDIAC CATHETERIZATION N/A 05/23/2015   Procedure: Left Heart Cath and Coronary Angiography;  Surgeon: Troy Sine, MD;  Location: [facility];  Service: Cardiovascular;  Laterality: N/A;  . CATARACT EXTRACTION W/ INTRAOCULAR LENS  IMPLANT, BILATERAL  10/2002  . COLONOSCOPY  07/31/04   multiple polyps; repeat in 3 years  . ESOPHAGEAL VARICE LIGATION  2012  . ETT myoview  03/09/07   nml EF 66%  . HEMORRHOID SURGERY     "cauterized a long time  ago" (11/02/2011)  . hosp CP R/O'D 2/24  03/08/07  . HUMERUS FRACTURE SURGERY Left 11/04/11   with plate to the shoulder  . L duputryen contractive surgery    . neck MRI  6/04   C5-6 disc abnormality  . ORIF HUMERUS FRACTURE  11/04/2011   Procedure: OPEN REDUCTION INTERNAL FIXATION (ORIF) PROXIMAL HUMERUS FRACTURE;  Surgeon: Rozanna Box, MD;  Location: [facility];  Service: Orthopedics;  Laterality: Left;  . POSTERIOR CERVICAL FUSION/FORAMINOTOMY N/A 03/21/2012   Procedure: POSTERIOR CERVICAL FUSION/FORAMINOTOMY LEVEL ONE;  Surgeon: Charlie Pitter, MD;  Location: [facility];  Service: Neurosurgery;  Laterality: N/A;  POSTERIOR CERVICAL ONE TO CERVICAL TWO FUSION WITH ILIAC CREST GRAFT AND LATERAL MASS SCREWS   . TONSILLECTOMY     "I was young" (11/02/2011)  . UPPER GASTROINTESTINAL ENDOSCOPY      Family History  Problem Relation Age of Onset  . Heart disease Father   . Diabetes Father   . Stroke Father   . Depression Mother   . Heart disease Brother   . Alcohol abuse Brother   . Liver disease Brother     Social History   Social History  . Marital status: Married    Spouse name: N/A  . Number of children: 3  . 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 Smoking status: Never Smoker  . Smokeless tobacco: Never Used  . Alcohol use 1.2 oz/week    2 Glasses of wine per week     Comment: 11/02/2011 "large box of wine q 2 days" 01-27-15 2 glass a day of wine  . Drug use: No  . Sexual activity: No   Other Topics Concern  . Not on file   Social History Narrative   Married (remarried), lives with wife; 3 children, 1 at home.    [employer] - mixes colors       Pt revised designated party release form Rogers Regimbal, wife [phone] - can leave msg.     Allen Norris 03/04/10.        Would desire CPR.  Would not want prolonged life support if futile.   The PMH, PSH, Social History, Family History,  Medications, and allergies have been reviewed in [facility], and have been updated if relevant. Dictation #1 [id number]  [id number]   Review of Systems  Constitutional: Negative.   Respiratory: Negative.   Cardiovascular: Negative.  Neurological: Positive for dizziness. Negative for tremors, seizures, syncope, facial asymmetry, speech difficulty, weakness, light-headedness, numbness and headaches.  Psychiatric/Behavioral: Negative.   All other systems reviewed and are negative.      Objective:    BP 116/72   Pulse 89   Temp 98.2 F (36.8 C) (Oral)   Wt 184 lb 5 oz (83.6 kg)   SpO2 100%   BMI 29.75 kg/m    Physical Exam  Constitutional: He is oriented to person, place, and time. He appears well-developed and well-nourished. No distress.  HENT:  Head: Normocephalic.  Eyes: Conjunctivae are normal.  +dix hallpike  Cardiovascular: Normal rate.   Pulmonary/Chest: Effort normal.  Musculoskeletal: Normal range of motion.  Neurological: He is alert and oriented to person, place, and time. No cranial nerve deficit.  Skin: Skin is warm and dry. He is not diaphoretic.  Psychiatric: He has a normal mood and affect. His behavior is normal. Judgment and thought content normal.  Nursing note and vitals reviewed.         Assessment & Plan:   Dizziness and giddiness  Chronic pain syndrome No Follow-up on file.

## 2015-12-09 NOTE — Addendum Note (Signed)
Addended by: Marchia Bond on: 12/09/2015 09:36 AM   Modules accepted: Orders

## 2015-12-09 NOTE — Assessment & Plan Note (Signed)
Explained to pt that stronger narcotics can worsen his fall risk. Will check labs today prior to changing rxs.

## 2015-12-09 NOTE — Assessment & Plan Note (Signed)
New- symptoms and exam consistent with vertigo. Will check labs today because he does seem dehydrated- still drinking quite a bit of wine daily. The patient indicates understanding of these issues and agrees with the plan.

## 2015-12-09 NOTE — Telephone Encounter (Signed)
PTs brother stopped by the office regarding a prescription that Dr. Deborra Medina had mentioned sending in after lab work. Please call the house number.

## 2015-12-10 NOTE — Telephone Encounter (Signed)
See results note. 

## 2015-12-10 NOTE — Telephone Encounter (Signed)
I am waiting to hear back from Dr. Deborra Medina regarding dose and sig. Will write RX as soon as I get this info

## 2015-12-10 NOTE — Telephone Encounter (Signed)
Spoke to pt who states that he was supposed to receive a pain medication from Dr Deborra Medina at his 11/28 OV but her computer crashed. OV note does not indicate which med is to be prescribed. Pt advised Dr Deborra Medina out of office today and we will contact him back when she has responded.

## 2015-12-10 NOTE — Telephone Encounter (Signed)
Please call patient back about rx and lab results.

## 2015-12-10 NOTE — Telephone Encounter (Signed)
Oxy IR (I would like to avoid tylenol in this pt)- 5 mg every 6 hours as needed for pain.  #30 with no refills. Thanks!

## 2015-12-11 ENCOUNTER — Telehealth: Payer: Self-pay

## 2015-12-11 MED ORDER — OXYCODONE HCL 5 MG PO TABS: 5.0000 mg | ORAL_TABLET | Freq: Four times a day (QID) | ORAL | 0 refills | Status: DC | PRN

## 2015-12-11 NOTE — Telephone Encounter (Signed)
Pt left note wanting to know if should continue taking Desyrel and toprol XL; for 4-6 weeks pt thinks BP has been low; BP varies from day to day but pt remembered 2 reading 120/80 and 113/70. Pt request cb.

## 2015-12-11 NOTE — Addendum Note (Signed)
Addended by: Jearld Fenton on: 12/11/2015 08:37 AM   Modules accepted: Orders

## 2015-12-11 NOTE — Telephone Encounter (Signed)
RX printed and signed and given to WK 

## 2015-12-11 NOTE — Telephone Encounter (Signed)
Spoke to pt and informed him Rx is available for pickup from the front desk 

## 2015-12-12 NOTE — Telephone Encounter (Signed)
Yes for now. If he has further concerns, he should make an appt to discuss with PCP

## 2015-12-12 NOTE — Telephone Encounter (Signed)
Spoke to pt and advised per [facility]. Pt not wanting to scheduled at this time. Requested refill of flexeril. Advised pt refills should be available through his mail order pharmacy and he can contact them directly to have it filled

## 2015-12-22 ENCOUNTER — Telehealth: Payer: Self-pay

## 2015-12-22 ENCOUNTER — Encounter: Payer: Self-pay | Admitting: Family Medicine

## 2015-12-22 ENCOUNTER — Ambulatory Visit (INDEPENDENT_AMBULATORY_CARE_PROVIDER_SITE_OTHER): Payer: Medicare Other | Admitting: Family Medicine

## 2015-12-22 VITALS — BP 144/70 | HR 87 | Temp 97.9°F | Wt 184.5 lb

## 2015-12-22 DIAGNOSIS — I2510 Atherosclerotic heart disease of native coronary artery without angina pectoris: Secondary | ICD-10-CM

## 2015-12-22 DIAGNOSIS — R42 Dizziness and giddiness: Secondary | ICD-10-CM

## 2015-12-22 DIAGNOSIS — I10 Essential (primary) hypertension: Secondary | ICD-10-CM | POA: Diagnosis not present

## 2015-12-22 MED ORDER — SPIRONOLACTONE 25 MG PO TABS: 25.0000 mg | ORAL_TABLET | Freq: Every day | ORAL | 3 refills | Status: DC

## 2015-12-22 NOTE — Patient Instructions (Signed)
Great to see you. Happy Holidays!  We are decreasing your spironolactone to 25 mg daily.  Please return in your in 2 weeks.

## 2015-12-22 NOTE — Telephone Encounter (Signed)
Mrs Doudna left v/m (DPR signed); pt was seen earlier today and forgot to ask for an order for a shower chair and also would pt qualify for a handicap placard. Mrs Meyering request cb.

## 2015-12-22 NOTE — Progress Notes (Signed)
Pre visit review using our clinic review tool, if applicable. No additional management support is needed unless otherwise documented below in the visit note. 

## 2015-12-22 NOTE — Assessment & Plan Note (Signed)
Difficult situation given that he is still drinking ETOH quite heavily.  Dix hallpike neg today. At this point, dizziness seems more related to orthostasis. Given his liver cirrhosis, I do not want him to d/c spironolactone completely.  Will decrease dose to 25 mg daily, follow up in 2 weeks. The patient indicates understanding of these issues and agrees with the plan.

## 2015-12-22 NOTE — Progress Notes (Signed)
Subjective:   Patient ID: [name], male    DOB: 1945-03-28, 70 y.o.   MRN: [id number]  [name] is a pleasant 70 y.o. year old male who presents to clinic today with his brother in law for Dizziness and Tinnitus ("buzzing")  on 12/22/2015  HPI:  Has been dizzy when changing head positions for past several weeks.  History of recurrent falls and ETOH abuse/alcoholic cirrhosis.  Saw him for this on 12/09/15.  Note reviewed.  Seemed consistent with vertigo. Checked labs because he appeared dry. Labs were stable.  Now dizziness seems to happen only when standing from a seated position.  Only took toprol this morning, held his spironolactone and felt much better.   Current Outpatient Prescriptions on File Prior to Visit  Medication Sig Dispense Refill  . B Complex Vitamins (VITAMIN B COMPLEX PO) Take 1 tablet by mouth daily.     . cetirizine (ZYRTEC) 10 MG tablet Take 10 mg by mouth daily as needed for allergies.    . cyclobenzaprine (FLEXERIL) 10 MG tablet Take 1 tablet (10 mg total) by mouth daily as needed for muscle spasms. 90 tablet 1  . Magnesium 500 MG TABS Take 500 mg by mouth daily.     . metoprolol succinate (TOPROL-XL) 25 MG 24 hr tablet Take 1 tablet (25 mg total) by mouth daily. 90 tablet 3  . oxyCODONE (OXY IR/ROXICODONE) 5 MG immediate release tablet Take 1 tablet (5 mg total) by mouth every 6 (six) hours as needed for severe pain. 30 tablet 0  . pantoprazole (PROTONIX) 40 MG tablet TAKE 1 TABLET TWICE A DAY 180 tablet 2  . promethazine (PHENERGAN) 25 MG tablet TAKE 1 TABLET DAILY AS NEEDED FOR NAUSEA 90 tablet 0  . QUEtiapine (SEROQUEL) 300 MG tablet Take 1 tablet (300 mg total) by mouth at bedtime. 90 tablet 1  . Thiamine HCl (VITAMIN B-1) 100 MG tablet Take 100 mg by mouth daily. Reported on 02/17/2015    . traZODone (DESYREL) 50 MG tablet Take 1 tablet (50 mg total) by mouth at bedtime. 90 tablet 1  . triamcinolone cream (KENALOG) 0.1 % Apply 1 application  topically daily as needed (itching).   3   No current facility-administered medications on file prior to visit.     Allergies  Allergen Reactions  . Nifedipine     REACTION: SWELLING 11/02/2011 pt denies this allergy.    Past Medical History:  Diagnosis Date  . Adenomatous polyp of colon 2006  . Allergy    pollen/ragweed  . Anemia 2012  . Anxiety   . Atrial tachycardia, paroxysmal ([place]) 04/07/2015  . BPH (benign prostatic hypertrophy) 5/99  . Chronic bronchitis ([place])    "q year or q other year" (11/02/2011)  . Colon polyp   . DCM (dilated cardiomyopathy) ([place])    EF 45-50% by echo and cath 2017  . Depression    Hx of  . Falls frequently    "daily lately; legs just give out" (11/02/2011)  . GERD (gastroesophageal reflux disease) 07/31/04   gastritis   . H/O hiatal hernia   . H/O nausea   . Hearing difficulty   . Hernia, umbilical    "unrepaired" (11/02/2011)  . History of blood transfusion 2012  . HLD (hyperlipidemia) 5/99  . HTN (hypertension) 5/99  . Humerus fracture 11/02/2011   LUE  . Memory loss    "recently has been forgetting alot; maybe related to the alcohol" (11/02/2011)  . Mild CAD  10% RCA  . PAC (premature atrial contraction) 04/07/2015  . Peripheral vascular disease ([place])   . Pneumonia 1996   hosp  . Seasonal allergies     Past Surgical History:  Procedure Laterality Date  . CARDIAC CATHETERIZATION N/A 05/23/2015   Procedure: Left Heart Cath and Coronary Angiography;  Surgeon: Troy Sine, MD;  Location: [facility];  Service: Cardiovascular;  Laterality: N/A;  . CATARACT EXTRACTION W/ INTRAOCULAR LENS  IMPLANT, BILATERAL  10/2002  . COLONOSCOPY  07/31/04   multiple polyps; repeat in 3 years  . ESOPHAGEAL VARICE LIGATION  2012  . ETT myoview  03/09/07   nml EF 66%  . HEMORRHOID SURGERY     "cauterized a long time ago" (11/02/2011)  . hosp CP R/O'D 2/24  03/08/07  . HUMERUS FRACTURE SURGERY Left 11/04/11   with plate to the shoulder   . L duputryen contractive surgery    . neck MRI  6/04   C5-6 disc abnormality  . ORIF HUMERUS FRACTURE  11/04/2011   Procedure: OPEN REDUCTION INTERNAL FIXATION (ORIF) PROXIMAL HUMERUS FRACTURE;  Surgeon: Rozanna Box, MD;  Location: [facility];  Service: Orthopedics;  Laterality: Left;  . POSTERIOR CERVICAL FUSION/FORAMINOTOMY N/A 03/21/2012   Procedure: POSTERIOR CERVICAL FUSION/FORAMINOTOMY LEVEL ONE;  Surgeon: Charlie Pitter, MD;  Location: [facility];  Service: Neurosurgery;  Laterality: N/A;  POSTERIOR CERVICAL ONE TO CERVICAL TWO FUSION WITH ILIAC CREST GRAFT AND LATERAL MASS SCREWS   . TONSILLECTOMY     "I was young" (11/02/2011)  . UPPER GASTROINTESTINAL ENDOSCOPY      Family History  Problem Relation Age of Onset  . Heart disease Father   . Diabetes Father   . Stroke Father   . Depression Mother   . Heart disease Brother   . Alcohol abuse Brother   . Liver disease Brother     Social History   Social History  . Marital status: Married    Spouse name: N/A  . Number of children: 3  . 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 Smoking status: Never Smoker  . Smokeless tobacco: Never Used  . Alcohol use 1.2 oz/week    2 Glasses of wine per week     Comment: 11/02/2011 "large box of wine q 2 days" 01-27-15 2 glass a day of wine  . Drug use: No  . Sexual activity: No   Other Topics Concern  . Not on file   Social History Narrative   Married (remarried), lives with wife; 3 children, 1 at home.    [employer] - mixes colors       Pt revised designated party release form [name], wife [phone] - can leave msg.     Allen Norris 03/04/10.        Would desire CPR.  Would not want prolonged life support if futile.   The PMH, PSH, Social History, Family History, Medications, and allergies have been reviewed in [facility], and have been updated if relevant.   Review of Systems    Constitutional: Negative.   Respiratory: Negative.   Cardiovascular: Negative.   Gastrointestinal: Negative.   Musculoskeletal: Negative.   Neurological: Positive for dizziness. Negative for tremors, seizures, syncope, facial asymmetry, speech difficulty, weakness, light-headedness, numbness and headaches.  Psychiatric/Behavioral: Negative.   All other systems reviewed and are negative.      Objective:  BP (!) 144/70   Pulse 87   Temp 97.9 F (36.6 C) (Oral)   Wt 184 lb 8 oz (83.7 kg)   SpO2 99%   BMI 29.78 kg/m   BP Readings from Last 3 Encounters:  12/22/15 (!) 144/70  12/09/15 116/72  11/20/15 112/60    Physical Exam  Constitutional: He is oriented to person, place, and time. He appears well-developed and well-nourished. No distress.  HENT:  Head: Normocephalic.  Eyes: Conjunctivae are normal.  Cardiovascular: Normal rate.   Musculoskeletal: He exhibits no edema.  Neurological: He is alert and oriented to person, place, and time. No cranial nerve deficit.  Skin: Skin is warm and dry. He is not diaphoretic.  Psychiatric: He has a normal mood and affect. His behavior is normal. Judgment and thought content normal.  Nursing note and vitals reviewed.         Assessment & Plan:   Essential hypertension - Plan: spironolactone (ALDACTONE) 25 MG tablet No Follow-up on file.

## 2015-12-24 ENCOUNTER — Telehealth: Payer: Self-pay

## 2015-12-24 DIAGNOSIS — Z9889 Other specified postprocedural states: Secondary | ICD-10-CM

## 2015-12-24 NOTE — Telephone Encounter (Signed)
Spoke to pt who states he already has order for shower chair. Pt also wanted to advise Dr Deborra Medina he has been unable to speak with neurosurgery. States he will continue making attempts to speak with them.

## 2015-12-24 NOTE — Telephone Encounter (Signed)
Is there anything we can do to help?

## 2015-12-24 NOTE — Telephone Encounter (Signed)
Noted.  I have placed referral and will route to [facility].

## 2015-12-24 NOTE — Telephone Encounter (Signed)
Pt left v/m; pt was seen on 12/22/15; pt said Dr Deborra Medina asked pt to contact neurosurgeon Dr Trenton Gammon; pt has been trying to reach Dr Trenton Gammon since 12/22/15 and no one is returning his call. Pt request Dr Deborra Medina to contact Dr Trenton Gammon.

## 2015-12-24 NOTE — Telephone Encounter (Signed)
Dr Deborra Medina will have to write another order. This was written on Rx paper and after pt stated it was not needed, it was torn in half.

## 2015-12-24 NOTE — Telephone Encounter (Signed)
[facility] office and Appt has already been made for 01/14/16 at 1:30pm with Dr Annette Stable with the patient.

## 2015-12-24 NOTE — Telephone Encounter (Signed)
Pt left v/m; pt spoke with someone earlier today [facility] CMA) that he already had an order for the shower chair; pt said the shower chair he got would not fit in his shower and would like to pick up the shower chair rx that Dr Deborra Medina wrote on 12/25/15. Please advise.

## 2015-12-24 NOTE — Telephone Encounter (Signed)
Rx written for shower chair and yes he would qualify for handicap placard

## 2015-12-25 NOTE — Telephone Encounter (Signed)
New rx written

## 2015-12-25 NOTE — Telephone Encounter (Signed)
Spoke to pt and informed him new order is available for pickup from the front desk

## 2016-01-14 ENCOUNTER — Ambulatory Visit: Payer: Medicare Other | Admitting: Family Medicine

## 2016-01-14 DIAGNOSIS — S12112D Nondisplaced Type II dens fracture, subsequent encounter for fracture with routine healing: Secondary | ICD-10-CM | POA: Diagnosis not present

## 2016-01-15 ENCOUNTER — Ambulatory Visit: Payer: Medicare Other | Admitting: Family Medicine

## 2016-01-20 ENCOUNTER — Ambulatory Visit: Payer: Medicare Other | Admitting: Family Medicine

## 2016-01-21 ENCOUNTER — Ambulatory Visit (INDEPENDENT_AMBULATORY_CARE_PROVIDER_SITE_OTHER): Payer: Medicare Other | Admitting: Family Medicine

## 2016-01-21 VITALS — BP 116/80 | HR 111 | Temp 98.4°F | Wt 187.0 lb

## 2016-01-21 DIAGNOSIS — G894 Chronic pain syndrome: Secondary | ICD-10-CM

## 2016-01-21 DIAGNOSIS — K7031 Alcoholic cirrhosis of liver with ascites: Secondary | ICD-10-CM | POA: Diagnosis not present

## 2016-01-21 DIAGNOSIS — R42 Dizziness and giddiness: Secondary | ICD-10-CM

## 2016-01-21 MED ORDER — OXYCODONE HCL 5 MG PO TABS: 5.0000 mg | ORAL_TABLET | Freq: Four times a day (QID) | ORAL | 0 refills | Status: DC | PRN

## 2016-01-21 NOTE — Assessment & Plan Note (Signed)
>  25 minutes spent in face to face time with patient, >50% spent in counselling or coordination of care mutlifactorial issue but we discussed that ETOH abuse is the constant underlying cause.  He continues to refuse help with rehab.

## 2016-01-21 NOTE — Progress Notes (Signed)
Pre visit review using our clinic review tool, if applicable. No additional management support is needed unless otherwise documented below in the visit note. 

## 2016-01-21 NOTE — Progress Notes (Signed)
Subjective:   Patient ID: [name], male    DOB: [birth year], 70 y.o.   MRN: [id number]  [name] is a pleasant 71 y.o. year old male who presents to clinic today with his wife Burman Nieves, for Dizziness (Still having dizziness and weakness in his legs) and Medication Refill (Needs Oxycodone Refill)  on 01/21/2016  HPI:  Has been dizzy when changing head positions for past several weeks.  History of recurrent falls and ETOH abuse/alcoholic cirrhosis.  Saw him for this on 12/09/15.  Note reviewed.  Seemed consistent with vertigo. Checked labs because he appeared dry. Labs were stable.  At follow up, dizziness seemed to happen more when standing from a seated position so we decreased the dose of his sprinolactone.  This seems to have helped some but he admittedly is still drinking heavily and having more weakness.  Left shoulder is very painful- taking oxycodone.  Current Outpatient Prescriptions on File Prior to Visit  Medication Sig Dispense Refill  . B Complex Vitamins (VITAMIN B COMPLEX PO) Take 1 tablet by mouth daily.     . cetirizine (ZYRTEC) 10 MG tablet Take 10 mg by mouth daily as needed for allergies.    . cyclobenzaprine (FLEXERIL) 10 MG tablet Take 1 tablet (10 mg total) by mouth daily as needed for muscle spasms. 90 tablet 1  . Magnesium 500 MG TABS Take 500 mg by mouth daily.     . metoprolol succinate (TOPROL-XL) 25 MG 24 hr tablet Take 1 tablet (25 mg total) by mouth daily. 90 tablet 3  . pantoprazole (PROTONIX) 40 MG tablet TAKE 1 TABLET TWICE A DAY 180 tablet 2  . promethazine (PHENERGAN) 25 MG tablet TAKE 1 TABLET DAILY AS NEEDED FOR NAUSEA 90 tablet 0  . QUEtiapine (SEROQUEL) 300 MG tablet Take 1 tablet (300 mg total) by mouth at bedtime. 90 tablet 1  . spironolactone (ALDACTONE) 25 MG tablet Take 1 tablet (25 mg total) by mouth daily. 90 tablet 3  . Thiamine HCl (VITAMIN B-1) 100 MG tablet Take 100 mg by mouth daily. Reported on 02/17/2015    . traZODone  (DESYREL) 50 MG tablet Take 1 tablet (50 mg total) by mouth at bedtime. 90 tablet 1  . triamcinolone cream (KENALOG) 0.1 % Apply 1 application topically daily as needed (itching).   3   No current facility-administered medications on file prior to visit.     Allergies  Allergen Reactions  . Nifedipine     REACTION: SWELLING 11/02/2011 pt denies this allergy.    Past Medical History:  Diagnosis Date  . Adenomatous polyp of colon 2006  . Allergy    pollen/ragweed  . Anemia 2012  . Anxiety   . Atrial tachycardia, paroxysmal ([place]) 04/07/2015  . BPH (benign prostatic hypertrophy) 5/99  . Chronic bronchitis ([place])    "q year or q other year" (11/02/2011)  . Colon polyp   . DCM (dilated cardiomyopathy) ([place])    EF 45-50% by echo and cath 2017  . Depression    Hx of  . Falls frequently    "daily lately; legs just give out" (11/02/2011)  . GERD (gastroesophageal reflux disease) 07/31/04   gastritis   . H/O hiatal hernia   . H/O nausea   . Hearing difficulty   . Hernia, umbilical    "unrepaired" (11/02/2011)  . History of blood transfusion 2012  . HLD (hyperlipidemia) 5/99  . HTN (hypertension) 5/99  . Humerus fracture 11/02/2011   LUE  .  Memory loss    "recently has been forgetting alot; maybe related to the alcohol" (11/02/2011)  . Mild CAD    10% RCA  . PAC (premature atrial contraction) 04/07/2015  . Peripheral vascular disease ([place])   . Pneumonia 1996   hosp  . Seasonal allergies     Past Surgical History:  Procedure Laterality Date  . CARDIAC CATHETERIZATION N/A 05/23/2015   Procedure: Left Heart Cath and Coronary Angiography;  Surgeon: Troy Sine, MD;  Location: [facility];  Service: Cardiovascular;  Laterality: N/A;  . CATARACT EXTRACTION W/ INTRAOCULAR LENS  IMPLANT, BILATERAL  10/2002  . COLONOSCOPY  07/31/04   multiple polyps; repeat in 3 years  . ESOPHAGEAL VARICE LIGATION  2012  . ETT myoview  03/09/07   nml EF 66%  . HEMORRHOID SURGERY      "cauterized a long time ago" (11/02/2011)  . hosp CP R/O'D 2/24  03/08/07  . HUMERUS FRACTURE SURGERY Left 11/04/11   with plate to the shoulder  . L duputryen contractive surgery    . neck MRI  6/04   C5-6 disc abnormality  . ORIF HUMERUS FRACTURE  11/04/2011   Procedure: OPEN REDUCTION INTERNAL FIXATION (ORIF) PROXIMAL HUMERUS FRACTURE;  Surgeon: Rozanna Box, MD;  Location: [facility];  Service: Orthopedics;  Laterality: Left;  . POSTERIOR CERVICAL FUSION/FORAMINOTOMY N/A 03/21/2012   Procedure: POSTERIOR CERVICAL FUSION/FORAMINOTOMY LEVEL ONE;  Surgeon: Charlie Pitter, MD;  Location: [facility];  Service: Neurosurgery;  Laterality: N/A;  POSTERIOR CERVICAL ONE TO CERVICAL TWO FUSION WITH ILIAC CREST GRAFT AND LATERAL MASS SCREWS   . TONSILLECTOMY     "I was young" (11/02/2011)  . UPPER GASTROINTESTINAL ENDOSCOPY      Family History  Problem Relation Age of Onset  . Heart disease Father   . Diabetes Father   . Stroke Father   . Depression Mother   . Heart disease Brother   . Alcohol abuse Brother   . Liver disease Brother     Social History   Social History  . Marital status: Married    Spouse name: N/A  . Number of children: 3  . 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 Smoking status: Never Smoker  . Smokeless tobacco: Never Used  . Alcohol use 1.2 oz/week    2 Glasses of wine per week     Comment: 11/02/2011 "large box of wine q 2 days" 01-27-15 2 glass a day of wine  . Drug use: No  . Sexual activity: No   Other Topics Concern  . Not on file   Social History Narrative   Married (remarried), lives with wife; 3 children, 1 at home.    [employer] - mixes colors       Pt revised designated party release form Myking Jindra, wife [phone] - can leave msg.     Allen Norris 03/04/10.        Would desire CPR.  Would not want prolonged life support if futile.   The PMH, PSH, Social  History, Family History, Medications, and allergies have been reviewed in [facility], and have been updated if relevant.   Review of Systems  Constitutional: Negative.   Eyes: Negative.   Respiratory: Negative.   Cardiovascular: Negative.   Gastrointestinal: Negative.   Musculoskeletal: Positive for arthralgias.  Neurological: Positive for dizziness. Negative for tremors, seizures, syncope, facial asymmetry, speech  difficulty, weakness, light-headedness, numbness and headaches.  All other systems reviewed and are negative.      Objective:    BP 116/80 (BP Location: Left Arm, Patient Position: Sitting, Cuff Size: Normal)   Pulse (!) 111   Temp 98.4 F (36.9 C) (Oral)   Wt 187 lb (84.8 kg)   SpO2 96%   BMI 30.18 kg/m    Physical Exam  Constitutional: He is oriented to person, place, and time. He appears well-developed and well-nourished. No distress.  HENT:  Head: Normocephalic and atraumatic.  Cardiovascular: Normal rate and regular rhythm.   Pulmonary/Chest: Breath sounds normal.  Musculoskeletal: Normal range of motion.  Neurological: He is alert and oriented to person, place, and time. No cranial nerve deficit.  Skin: Skin is warm and dry. He is not diaphoretic.  Psychiatric: He has a normal mood and affect. His behavior is normal. Judgment and thought content normal.  Nursing note and vitals reviewed.         Assessment & Plan:   Dizziness and giddiness  Chronic pain syndrome  Alcoholic cirrhosis of liver with ascites (HCC) No Follow-up on file.

## 2016-01-21 NOTE — Assessment & Plan Note (Signed)
Looked up pt in controlled substance database. Rx refilled for oxycodone and given to pt. He and his wife are aware of increased fall risk.

## 2016-02-20 ENCOUNTER — Ambulatory Visit (INDEPENDENT_AMBULATORY_CARE_PROVIDER_SITE_OTHER): Payer: Medicare Other | Admitting: Primary Care

## 2016-02-20 ENCOUNTER — Ambulatory Visit (INDEPENDENT_AMBULATORY_CARE_PROVIDER_SITE_OTHER)
Admission: RE | Admit: 2016-02-20 | Discharge: 2016-02-20 | Disposition: A | Discharge status: Home / Self Care | Payer: Medicare Other | Source: Ambulatory Visit | Attending: Primary Care | Admitting: Primary Care

## 2016-02-20 VITALS — BP 124/82 | HR 92 | Temp 98.1°F | Wt 185.8 lb

## 2016-02-20 DIAGNOSIS — R21 Rash and other nonspecific skin eruption: Secondary | ICD-10-CM

## 2016-02-20 DIAGNOSIS — M25512 Pain in left shoulder: Secondary | ICD-10-CM

## 2016-02-20 DIAGNOSIS — S40012A Contusion of left shoulder, initial encounter: Secondary | ICD-10-CM | POA: Diagnosis not present

## 2016-02-20 DIAGNOSIS — G894 Chronic pain syndrome: Secondary | ICD-10-CM | POA: Diagnosis not present

## 2016-02-20 MED ORDER — OXYCODONE HCL 5 MG PO TABS: 5.0000 mg | ORAL_TABLET | Freq: Four times a day (QID) | ORAL | 0 refills | Status: DC | PRN

## 2016-02-20 MED ORDER — VALACYCLOVIR HCL 1 G PO TABS: 1000.0000 mg | ORAL_TABLET | Freq: Three times a day (TID) | ORAL | 0 refills | Status: DC

## 2016-02-20 NOTE — Patient Instructions (Signed)
Start valacyclovir 1000 mg tablets. Take 1 tablet by mouth three times daily for 7 days.  Complete xray(s) prior to leaving today. I will notify you of your results once received.  Stop by the front desk and speak with Rosaria Ferries regarding your referral to Dermatology.  It was a pleasure to see you today!

## 2016-02-20 NOTE — Progress Notes (Signed)
Pre visit review using our clinic review tool, if applicable. No additional management support is needed unless otherwise documented below in the visit note. 

## 2016-02-20 NOTE — Progress Notes (Signed)
Subjective:    Patient ID: [name], male    DOB: Aug 20, 1945, 71 y.o.   MRN: [id number]  HPI  Mr. Maben is a 71 year old male who presents today with a chief complaint of skin breakout. He has scabbing and blistering that he first noticed 1-2 week ago. It started to his left anterior wrist and now to his right. He has noticed pain and blisters to the right wrist with scabbing to the left. He's been applying neosporin without improvement. He was treated for cellulitis to his left upper extremity about 1 month ago. He denies fevers, burns. He fell 1 week ago landing on his left shoulder. He's also noticed scabs to his face that began 2 weeks ago.   He denies insects, changes in medications, soaps, detergents.   Review of Systems  Constitutional: Negative for fever.  Respiratory: Negative for shortness of breath.   Musculoskeletal:       Left shoulder pain  Skin: Positive for color change, rash and wound.       Past Medical History:  Diagnosis Date  . Adenomatous polyp of colon 2006  . Allergy    pollen/ragweed  . Anemia 2012  . Anxiety   . Atrial tachycardia, paroxysmal ([place]) 04/07/2015  . BPH (benign prostatic hypertrophy) 5/99  . Chronic bronchitis ([place])    "q year or q other year" (11/02/2011)  . Colon polyp   . DCM (dilated cardiomyopathy) ([place])    EF 45-50% by echo and cath 2017  . Depression    Hx of  . Falls frequently    "daily lately; legs just give out" (11/02/2011)  . GERD (gastroesophageal reflux disease) 07/31/04   gastritis   . H/O hiatal hernia   . H/O nausea   . Hearing difficulty   . Hernia, umbilical    "unrepaired" (11/02/2011)  . History of blood transfusion 2012  . HLD (hyperlipidemia) 5/99  . HTN (hypertension) 5/99  . Humerus fracture 11/02/2011   LUE  . Memory loss    "recently has been forgetting alot; maybe related to the alcohol" (11/02/2011)  . Mild CAD    10% RCA  . PAC (premature atrial contraction) 04/07/2015  . Peripheral  vascular disease ([place])   . Pneumonia 1996   hosp  . Seasonal allergies      Social History   Social History  . Marital status: Married    Spouse name: N/A  . Number of children: 3  . 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 Smoking status: Never Smoker  . Smokeless tobacco: Never Used  . Alcohol use 1.2 oz/week    2 Glasses of wine per week     Comment: 11/02/2011 "large box of wine q 2 days" 01-27-15 2 glass a day of wine  . Drug use: No  . Sexual activity: No   Other Topics Concern  . Not on file   Social History Narrative   Married (remarried), lives with wife; 3 children, 1 at home.    [employer] - mixes colors       Pt revised designated party release form [place], wife [phone] - can leave msg.     Allen Norris 03/04/10.        Would desire CPR.  Would not want prolonged life support if futile.    Past Surgical History:  Procedure Laterality Date  . CARDIAC  CATHETERIZATION N/A 05/23/2015   Procedure: Left Heart Cath and Coronary Angiography;  Surgeon: Troy Sine, MD;  Location: [facility];  Service: Cardiovascular;  Laterality: N/A;  . CATARACT EXTRACTION W/ INTRAOCULAR LENS  IMPLANT, BILATERAL  10/2002  . COLONOSCOPY  07/31/04   multiple polyps; repeat in 3 years  . ESOPHAGEAL VARICE LIGATION  2012  . ETT myoview  03/09/07   nml EF 66%  . HEMORRHOID SURGERY     "cauterized a long time ago" (11/02/2011)  . hosp CP R/O'D 2/24  03/08/07  . HUMERUS FRACTURE SURGERY Left 11/04/11   with plate to the shoulder  . L duputryen contractive surgery    . neck MRI  6/04   C5-6 disc abnormality  . ORIF HUMERUS FRACTURE  11/04/2011   Procedure: OPEN REDUCTION INTERNAL FIXATION (ORIF) PROXIMAL HUMERUS FRACTURE;  Surgeon: Rozanna Box, MD;  Location: [facility];  Service: Orthopedics;  Laterality: Left;  . POSTERIOR CERVICAL FUSION/FORAMINOTOMY N/A 03/21/2012   Procedure:  POSTERIOR CERVICAL FUSION/FORAMINOTOMY LEVEL ONE;  Surgeon: Charlie Pitter, MD;  Location: [facility];  Service: Neurosurgery;  Laterality: N/A;  POSTERIOR CERVICAL ONE TO CERVICAL TWO FUSION WITH ILIAC CREST GRAFT AND LATERAL MASS SCREWS   . TONSILLECTOMY     "I was young" (11/02/2011)  . UPPER GASTROINTESTINAL ENDOSCOPY      Family History  Problem Relation Age of Onset  . Heart disease Father   . Diabetes Father   . Stroke Father   . Depression Mother   . Heart disease Brother   . Alcohol abuse Brother   . Liver disease Brother     Allergies  Allergen Reactions  . Nifedipine     REACTION: SWELLING 11/02/2011 pt denies this allergy.    Current Outpatient Prescriptions on File Prior to Visit  Medication Sig Dispense Refill  . B Complex Vitamins (VITAMIN B COMPLEX PO) Take 1 tablet by mouth daily.     . cetirizine (ZYRTEC) 10 MG tablet Take 10 mg by mouth daily as needed for allergies.    . cyclobenzaprine (FLEXERIL) 10 MG tablet Take 1 tablet (10 mg total) by mouth daily as needed for muscle spasms. 90 tablet 1  . Magnesium 500 MG TABS Take 500 mg by mouth daily.     . metoprolol succinate (TOPROL-XL) 25 MG 24 hr tablet Take 1 tablet (25 mg total) by mouth daily. 90 tablet 3  . pantoprazole (PROTONIX) 40 MG tablet TAKE 1 TABLET TWICE A DAY 180 tablet 2  . promethazine (PHENERGAN) 25 MG tablet TAKE 1 TABLET DAILY AS NEEDED FOR NAUSEA 90 tablet 0  . QUEtiapine (SEROQUEL) 300 MG tablet Take 1 tablet (300 mg total) by mouth at bedtime. 90 tablet 1  . spironolactone (ALDACTONE) 25 MG tablet Take 1 tablet (25 mg total) by mouth daily. 90 tablet 3  . Thiamine HCl (VITAMIN B-1) 100 MG tablet Take 100 mg by mouth daily. Reported on 02/17/2015    . traZODone (DESYREL) 50 MG tablet Take 1 tablet (50 mg total) by mouth at bedtime. 90 tablet 1  . triamcinolone cream (KENALOG) 0.1 % Apply 1 application topically daily as needed (itching).   3   No current facility-administered medications on  file prior to visit.     BP 124/82   Pulse 92   Temp 98.1 F (36.7 C) (Oral)   Wt 185 lb 12.8 oz (84.3 kg)   SpO2 97%   BMI 29.99 kg/m    Objective:  Physical Exam  Constitutional: He appears well-nourished.  Neck: Neck supple.  Cardiovascular: Normal rate and regular rhythm.   Pulmonary/Chest: Effort normal and breath sounds normal.  Musculoskeletal:       Left shoulder: He exhibits decreased range of motion and pain. He exhibits no tenderness and no swelling.  Skin:  Closed and open blisters to right anterior and lateral wrist. Closed sore with scabbing to right posterior upper forearm. Closed, scabbed wound to left anterior/lateral wrist. No s/s of bacterial infection. Numerous scabs to face. Some blistering to scalp.          Assessment & Plan:  Non Specific Skin Eruption:  Noted to bilateral wrists and to face. Very odd presentation not representative of cellulitis, insect involvement. Appears to be either viral or bullous pempigoid. Will treat for viral cause, dressing applied, referral to dermatology placed. Discussed infection prevention.  Left Shoulder Pain:  Fell 1 week ago, landing on left shoulder. Moderate bruising noted to site with pain upon PROM. No blood thinners. No obvious deformity. Check xray today. Refill of oxycodone provided.  Sheral Flow, NP

## 2016-02-23 ENCOUNTER — Telehealth: Payer: Self-pay

## 2016-02-23 NOTE — Telephone Encounter (Signed)
Please notify patient and his wife that we aren't exactly sure of the cause but these seem to be viral in nature. He is to take the valtrex as prescribed and follow up with dermatology as scheduled. Rosaria Ferries, can we get him in with dermatology sooner?

## 2016-02-23 NOTE — Telephone Encounter (Signed)
Pt's wife, on DPR, called to get clarification about his office visit last week. Wanted to know what the dr had said about the spots on his skin. Wife said the blisters look really bad and they are opening up more. She is wondering if there is anything else he can be doing for them before the dermatology appointment on the 20th. He said they are itchy. Please call her back

## 2016-02-23 NOTE — Telephone Encounter (Signed)
Spoken and notified patient's spouse of Kate's comments. Patient's spouse verbalized understanding. 

## 2016-02-24 NOTE — Telephone Encounter (Signed)
Called Dr [name] Crane and had appt moved up to tomorrow, 02/25/16 at 2:40pm. Patients wife notified.

## 2016-02-25 DIAGNOSIS — L120 Bullous pemphigoid: Secondary | ICD-10-CM | POA: Diagnosis not present

## 2016-02-27 DIAGNOSIS — L120 Bullous pemphigoid: Secondary | ICD-10-CM | POA: Diagnosis not present

## 2016-03-05 ENCOUNTER — Telehealth: Payer: Self-pay

## 2016-03-05 ENCOUNTER — Emergency Department (HOSPITAL_COMMUNITY): Payer: Medicare Other

## 2016-03-05 ENCOUNTER — Encounter (HOSPITAL_COMMUNITY): Payer: Self-pay

## 2016-03-05 ENCOUNTER — Emergency Department (HOSPITAL_COMMUNITY)
Admission: EM | Admit: 2016-03-05 | Discharge: 2016-03-06 | Disposition: A | Discharge status: Home / Self Care | Payer: Medicare Other | Attending: Emergency Medicine | Admitting: Emergency Medicine

## 2016-03-05 DIAGNOSIS — Z79899 Other long term (current) drug therapy: Secondary | ICD-10-CM | POA: Diagnosis not present

## 2016-03-05 DIAGNOSIS — L120 Bullous pemphigoid: Secondary | ICD-10-CM | POA: Diagnosis not present

## 2016-03-05 DIAGNOSIS — R0689 Other abnormalities of breathing: Secondary | ICD-10-CM | POA: Diagnosis not present

## 2016-03-05 DIAGNOSIS — I10 Essential (primary) hypertension: Secondary | ICD-10-CM | POA: Insufficient documentation

## 2016-03-05 DIAGNOSIS — R791 Abnormal coagulation profile: Secondary | ICD-10-CM | POA: Diagnosis not present

## 2016-03-05 DIAGNOSIS — Y929 Unspecified place or not applicable: Secondary | ICD-10-CM | POA: Insufficient documentation

## 2016-03-05 DIAGNOSIS — R0602 Shortness of breath: Secondary | ICD-10-CM | POA: Diagnosis not present

## 2016-03-05 DIAGNOSIS — Y939 Activity, unspecified: Secondary | ICD-10-CM | POA: Insufficient documentation

## 2016-03-05 DIAGNOSIS — I2510 Atherosclerotic heart disease of native coronary artery without angina pectoris: Secondary | ICD-10-CM | POA: Insufficient documentation

## 2016-03-05 DIAGNOSIS — S0990XA Unspecified injury of head, initial encounter: Secondary | ICD-10-CM | POA: Diagnosis present

## 2016-03-05 DIAGNOSIS — W01198A Fall on same level from slipping, tripping and stumbling with subsequent striking against other object, initial encounter: Secondary | ICD-10-CM | POA: Insufficient documentation

## 2016-03-05 DIAGNOSIS — Y999 Unspecified external cause status: Secondary | ICD-10-CM | POA: Insufficient documentation

## 2016-03-05 DIAGNOSIS — S0081XA Abrasion of other part of head, initial encounter: Secondary | ICD-10-CM | POA: Diagnosis not present

## 2016-03-05 DIAGNOSIS — R42 Dizziness and giddiness: Secondary | ICD-10-CM | POA: Insufficient documentation

## 2016-03-05 LAB — URINALYSIS, ROUTINE W REFLEX MICROSCOPIC
BILIRUBIN URINE: NEGATIVE
Glucose, UA: NEGATIVE mg/dL
Hgb urine dipstick: NEGATIVE
Ketones, ur: NEGATIVE mg/dL
Leukocytes, UA: NEGATIVE
NITRITE: NEGATIVE
PROTEIN: NEGATIVE mg/dL
Specific Gravity, Urine: 1.018 (ref 1.005–1.030)
pH: 5 (ref 5.0–8.0)

## 2016-03-05 LAB — HEPATIC FUNCTION PANEL
ALT: 29 U/L (ref 17–63)
AST: 40 U/L (ref 15–41)
Albumin: 3 g/dL — ABNORMAL LOW (ref 3.5–5.0)
Alkaline Phosphatase: 173 U/L — ABNORMAL HIGH (ref 38–126)
BILIRUBIN DIRECT: 0.5 mg/dL (ref 0.1–0.5)
BILIRUBIN INDIRECT: 0.9 mg/dL (ref 0.3–0.9)
BILIRUBIN TOTAL: 1.4 mg/dL — AB (ref 0.3–1.2)
Total Protein: 5.9 g/dL — ABNORMAL LOW (ref 6.5–8.1)

## 2016-03-05 LAB — RAPID URINE DRUG SCREEN, HOSP PERFORMED
Amphetamines: NOT DETECTED
Barbiturates: NOT DETECTED
Benzodiazepines: NOT DETECTED
Cocaine: NOT DETECTED
Opiates: NOT DETECTED
Tetrahydrocannabinol: NOT DETECTED

## 2016-03-05 LAB — ETHANOL: Alcohol, Ethyl (B): 185 mg/dL — ABNORMAL HIGH (ref <5)

## 2016-03-05 LAB — PROTIME-INR
INR: 1.23
Prothrombin Time: 15.6 seconds — ABNORMAL HIGH (ref 11.4–15.2)

## 2016-03-05 LAB — CBC
HCT: 30.2 % — ABNORMAL LOW (ref 39.0–52.0)
Hemoglobin: 10.7 g/dL — ABNORMAL LOW (ref 13.0–17.0)
MCH: 33.8 pg (ref 26.0–34.0)
MCHC: 35.4 g/dL (ref 30.0–36.0)
MCV: 95.3 fL (ref 78.0–100.0)
PLATELETS: 281 10*3/uL (ref 150–400)
RBC: 3.17 MIL/uL — AB (ref 4.22–5.81)
RDW: 16.7 % — ABNORMAL HIGH (ref 11.5–15.5)
WBC: 12.3 10*3/uL — ABNORMAL HIGH (ref 4.0–10.5)

## 2016-03-05 LAB — BASIC METABOLIC PANEL
Anion gap: 14 (ref 5–15)
BUN: 11 mg/dL (ref 6–20)
CALCIUM: 8.6 mg/dL — AB (ref 8.9–10.3)
CO2: 20 mmol/L — AB (ref 22–32)
CREATININE: 0.93 mg/dL (ref 0.61–1.24)
Chloride: 96 mmol/L — ABNORMAL LOW (ref 101–111)
GFR calc Af Amer: >60 mL/min (ref >60)
Glucose, Bld: 139 mg/dL — ABNORMAL HIGH (ref 65–99)
Potassium: 4.4 mmol/L (ref 3.5–5.1)
Sodium: 130 mmol/L — ABNORMAL LOW (ref 135–145)

## 2016-03-05 LAB — D-DIMER, QUANTITATIVE: D-Dimer, Quant: 1.13 ug/mL-FEU — ABNORMAL HIGH (ref 0.00–0.50)

## 2016-03-05 LAB — BRAIN NATRIURETIC PEPTIDE: B Natriuretic Peptide: 19 pg/mL (ref 0.0–100.0)

## 2016-03-05 LAB — AMMONIA: Ammonia: 11 umol/L (ref 9–35)

## 2016-03-05 LAB — LIPASE, BLOOD: Lipase: 43 U/L (ref 11–51)

## 2016-03-05 MED ORDER — LORAZEPAM 1 MG PO TABS
2.0000 mg | ORAL_TABLET | Freq: Once | ORAL | Status: AC
  Administered 2016-03-05: 2 mg via ORAL
  Filled 2016-03-05: qty 2

## 2016-03-05 MED ORDER — IOPAMIDOL (ISOVUE-370) INJECTION 76%
INTRAVENOUS | Status: AC
  Administered 2016-03-05: 80 mL
  Filled 2016-03-05: qty 100

## 2016-03-05 NOTE — ED Notes (Signed)
Dizziness since sunday

## 2016-03-05 NOTE — Telephone Encounter (Signed)
Mrs Stimac said pt was wanting oxycodone rx. But pt fell backwards on 03/02/16  And hit head and then pt fell again on 03/04/16 and this time fell forward. Mrs Dawson said pt was having pain and swelling in legs and pts hips are black and blue. I advised pt needed to go to ED for eval. Mrs Lauby was at worked and asked me call pt at home. I spoke with pt and his brother who is with him now and pt will go to [facility] ED now and his brother will take him. Pt is also SOB on and off.FYI to Dr Deborra Medina.

## 2016-03-05 NOTE — ED Triage Notes (Signed)
Pt reports dizziness, multiple falls (Tuesday and yesterday), blurry vision, and bilateral leg swelling. This started within the last week but states he feels as though the symptoms are worsening. The fall he had yesterday (tripped over a blanket), fell and hit his head, denies taking blood thinning medication. Pt is currently alert and oriented but answered the month incorrectly. Sons states that is not abnormal for him.

## 2016-03-05 NOTE — ED Notes (Signed)
The pt tolerated standing with little assistance.  He reports feeling a little lightheaded

## 2016-03-05 NOTE — ED Notes (Signed)
The pt has multiple complaints he is an alcoholic that drinks every day.  He has had dizziness and he has fallen x 2 since Sunday  He last fell yesterday when he tripped on a rug  He has lower back pain  ?? Loc  Yesterday  When he fell and struck his head he has a scabbed over abrasion on his rt hand.  His legs are swelling  And he is very hard of hearing .  Friend with him

## 2016-03-06 NOTE — ED Provider Notes (Signed)
[facility] DEPT Provider Note   CSN: [id number] Arrival date & time: 03/05/16  1450     History   Chief Complaint Chief Complaint  Patient presents with  . Dizziness    HPI [name] is a 71 y.o. male.  HPI Patient presents to the emergency department with dizziness and increased falls over the last 2 weeks.  Patient does drink fairly heavily and states that he does not always use his walker.  Patient states he has following twice since Sunday.  The he does not think he lost consciousness, but does not completely sure.  The patient states that he has had difficulty with ambulation.  For a while limits when he needs a walker.  Patient states that he did not take any medications prior to arrival for his symptoms. The patient denies chest pain, shortness of breath, headache,blurred vision, neck pain, fever, cough,  numbness, anorexia, edema, abdominal pain, nausea, vomiting, diarrhea, rash, back pain, dysuria, hematemesis, bloody stool, near syncope, or syncope.  Patient states his eating and drinking habits are not the best Past Medical History:  Diagnosis Date  . Adenomatous polyp of colon 2006  . Allergy    pollen/ragweed  . Anemia 2012  . Anxiety   . Atrial tachycardia, paroxysmal ([place]) 04/07/2015  . BPH (benign prostatic hypertrophy) 5/99  . Chronic bronchitis ([place])    "q year or q other year" (11/02/2011)  . Colon polyp   . DCM (dilated cardiomyopathy) ([place])    EF 45-50% by echo and cath 2017  . Depression    Hx of  . Falls frequently    "daily lately; legs just give out" (11/02/2011)  . GERD (gastroesophageal reflux disease) 07/31/04   gastritis   . H/O hiatal hernia   . H/O nausea   . Hearing difficulty   . Hernia, umbilical    "unrepaired" (11/02/2011)  . History of blood transfusion 2012  . HLD (hyperlipidemia) 5/99  . HTN (hypertension) 5/99  . Humerus fracture 11/02/2011   LUE  . Memory loss    "recently has been forgetting alot; maybe related to  the alcohol" (11/02/2011)  . Mild CAD    10% RCA  . PAC (premature atrial contraction) 04/07/2015  . Peripheral vascular disease ([place])   . Pneumonia 1996   hosp  . Seasonal allergies     Patient Active Problem List   Diagnosis Date Noted  . Dizziness and giddiness 12/09/2015  . Chronic pain syndrome 12/09/2015  . Arthritis 11/20/2015  . Mild CAD   . DCM (dilated cardiomyopathy) ([place])   . Abnormal nuclear stress test 05/07/2015  . PAC (premature atrial contraction) 04/07/2015  . Atrial tachycardia, paroxysmal ([place]) 03/10/2015  . Depression 04/12/2012  . C2 cervical fracture ([place]) 02/29/2012  . Fracture of humerus, proximal, left, closed 11/02/2011  . Hyponatremia 11/17/2010  . H/O: upper GI bleed 10/20/2010  . Stomach ulcer from aspirin/ibuprofen-like drugs (NSAID's) 10/20/2010  . Colon polyp 08/26/2010  . Diverticulosis of colon (without mention of hemorrhage) 08/26/2010  . BPH (benign prostatic hyperplasia) 07/02/2010  . Deficiency anemia 03/09/2010  . Hyposmolality and/or hyponatremia 10/17/2009  . ALCOHOLIC CIRRHOSIS OF LIVER 07/25/2009  . ANXIETY STATE, UNSPECIFIED 01/24/2007  . HLD (hyperlipidemia) 07/28/2006  . Alcohol abuse 07/28/2006  . Essential hypertension 07/28/2006  . IMPOTENCE, ORGANIC ORIGIN 07/28/2006    Past Surgical History:  Procedure Laterality Date  . CARDIAC CATHETERIZATION N/A 05/23/2015   Procedure: Left Heart Cath and Coronary Angiography;  Surgeon: Troy Sine, MD;  Location: [facility];  Service: Cardiovascular;  Laterality: N/A;  . CATARACT EXTRACTION W/ INTRAOCULAR LENS  IMPLANT, BILATERAL  10/2002  . COLONOSCOPY  07/31/04   multiple polyps; repeat in 3 years  . ESOPHAGEAL VARICE LIGATION  2012  . ETT myoview  03/09/07   nml EF 66%  . HEMORRHOID SURGERY     "cauterized a long time ago" (11/02/2011)  . hosp CP R/O'D 2/24  03/08/07  . HUMERUS FRACTURE SURGERY Left 11/04/11   with plate to the shoulder  . L duputryen contractive  surgery    . neck MRI  6/04   C5-6 disc abnormality  . ORIF HUMERUS FRACTURE  11/04/2011   Procedure: OPEN REDUCTION INTERNAL FIXATION (ORIF) PROXIMAL HUMERUS FRACTURE;  Surgeon: Rozanna Box, MD;  Location: [facility];  Service: Orthopedics;  Laterality: Left;  . POSTERIOR CERVICAL FUSION/FORAMINOTOMY N/A 03/21/2012   Procedure: POSTERIOR CERVICAL FUSION/FORAMINOTOMY LEVEL ONE;  Surgeon: Charlie Pitter, MD;  Location: [facility];  Service: Neurosurgery;  Laterality: N/A;  POSTERIOR CERVICAL ONE TO CERVICAL TWO FUSION WITH ILIAC CREST GRAFT AND LATERAL MASS SCREWS   . TONSILLECTOMY     "I was young" (11/02/2011)  . UPPER GASTROINTESTINAL ENDOSCOPY         Home Medications    Prior to Admission medications   Medication Sig Start Date End Date Taking? Authorizing Provider  B Complex Vitamins (VITAMIN B COMPLEX PO) Take 1 tablet by mouth daily.    Yes Historical Provider, MD  cetirizine (ZYRTEC) 10 MG tablet Take 10 mg by mouth daily as needed for allergies.   Yes Historical Provider, MD  doxycycline (VIBRA-TABS) 100 MG tablet Take 100 mg by mouth 2 (two) times daily. 02/27/16  Yes Historical Provider, MD  loperamide (IMODIUM A-D) 2 MG tablet Take 2 mg by mouth 4 (four) times daily as needed for diarrhea or loose stools.   Yes Historical Provider, MD  Magnesium 500 MG TABS Take 500 mg by mouth daily as needed (energy).    Yes Historical Provider, MD  metoprolol succinate (TOPROL-XL) 25 MG 24 hr tablet Take 1 tablet (25 mg total) by mouth daily. 06/04/15  Yes Almyra Deforest, PA  naproxen sodium (ANAPROX) 220 MG tablet Take 440-880 mg by mouth 2 (two) times daily as needed (PAIN).   Yes Historical Provider, MD  predniSONE (DELTASONE) 10 MG tablet Take 10-60 mg by mouth daily with breakfast.    Yes Historical Provider, MD  promethazine (PHENERGAN) 25 MG tablet TAKE 1 TABLET DAILY AS NEEDED FOR NAUSEA 12/09/15  Yes Lucille Passy, MD  QUEtiapine (SEROQUEL) 300 MG tablet Take 1 tablet (300 mg total) by mouth  at bedtime. 11/20/15  Yes Lucille Passy, MD  spironolactone (ALDACTONE) 25 MG tablet Take 1 tablet (25 mg total) by mouth daily. 12/22/15  Yes Lucille Passy, MD  Thiamine HCl (VITAMIN B-1) 100 MG tablet Take 100 mg by mouth daily. Reported on 02/17/2015   Yes Historical Provider, MD  traZODone (DESYREL) 50 MG tablet Take 1 tablet (50 mg total) by mouth at bedtime. 08/08/15  Yes Lucille Passy, MD  triamcinolone cream (KENALOG) 0.1 % Apply 1 application topically daily as needed (itching).  03/12/15  Yes Historical Provider, MD  oxyCODONE (OXY IR/ROXICODONE) 5 MG immediate release tablet Take 1 tablet (5 mg total) by mouth every 6 (six) hours as needed for severe pain. Patient not taking: Reported on 03/05/2016 02/20/16   Pleas Koch, NP    Family History Family  History  Problem Relation Age of Onset  . Heart disease Father   . Diabetes Father   . Stroke Father   . Depression Mother   . Heart disease Brother   . Alcohol abuse Brother   . Liver disease Brother     Social History Social History  Substance Use Topics  . Smoking status: Never Smoker  . Smokeless tobacco: Never Used  . Alcohol use 1.2 oz/week    2 Glasses of wine per week     Comment: 11/02/2011 "large box of wine q 2 days" 01-27-15 2 glass a day of wine     Allergies   Nifedipine   Review of Systems Review of Systems All other systems negative except as documented in the HPI. All pertinent positives and negatives as reviewed in the HPI.  Physical Exam Updated Vital Signs BP 123/88   Pulse 108   Temp 98.5 F (36.9 C) (Oral)   Resp 14   Ht 5\' 9"  (1.753 m)   Wt 83.9 kg   SpO2 96%   BMI 27.32 kg/m   Physical Exam  Constitutional: He is oriented to person, place, and time. He appears well-developed and well-nourished. No distress.  HENT:  Head: Normocephalic.    Mouth/Throat: Oropharynx is clear and moist.  Eyes: Pupils are equal, round, and reactive to light.  Neck: Normal range of motion. Neck supple.    Cardiovascular: Normal rate, regular rhythm and normal heart sounds.  Exam reveals no gallop and no friction rub.   No murmur heard. Pulmonary/Chest: Effort normal and breath sounds normal. No respiratory distress. He has no wheezes.  Abdominal: Soft. Bowel sounds are normal. He exhibits no distension. There is no tenderness.  Neurological: He is alert and oriented to person, place, and time. He exhibits normal muscle tone. Coordination normal.  Skin: Skin is warm and dry. No rash noted. No erythema.  Psychiatric: He has a normal mood and affect. His behavior is normal.  Nursing note and vitals reviewed.    ED Treatments / Results  Labs (all labs ordered are listed, but only abnormal results are displayed) Labs Reviewed  BASIC METABOLIC PANEL - Abnormal; Notable for the following:       Result Value   Sodium 130 (*)    Chloride 96 (*)    CO2 20 (*)    Glucose, Bld 139 (*)    Calcium 8.6 (*)    All other components within normal limits  CBC - Abnormal; Notable for the following:    WBC 12.3 (*)    RBC 3.17 (*)    Hemoglobin 10.7 (*)    HCT 30.2 (*)    RDW 16.7 (*)    All other components within normal limits  PROTIME-INR - Abnormal; Notable for the following:    Prothrombin Time 15.6 (*)    All other components within normal limits  ETHANOL - Abnormal; Notable for the following:    Alcohol, Ethyl (B) 185 (*)    All other components within normal limits  D-DIMER, QUANTITATIVE (NOT AT [facility]) - Abnormal; Notable for the following:    D-Dimer, Quant 1.13 (*)    All other components within normal limits  HEPATIC FUNCTION PANEL - Abnormal; Notable for the following:    Total Protein 5.9 (*)    Albumin 3.0 (*)    Alkaline Phosphatase 173 (*)    Total Bilirubin 1.4 (*)    All other components within normal limits  URINALYSIS, ROUTINE W REFLEX MICROSCOPIC  RAPID  URINE DRUG SCREEN, HOSP PERFORMED  LIPASE, BLOOD  AMMONIA  BRAIN NATRIURETIC PEPTIDE  CBG MONITORING, ED     EKG  EKG Interpretation None       Radiology Ct Head Wo Contrast  Result Date: 03/05/2016 CLINICAL DATA:  Frequent falls in the past 5 days with loss of consciousness and dizziness. Head injury. Confusion. EXAM: CT HEAD WITHOUT CONTRAST TECHNIQUE: Contiguous axial images were obtained from the base of the skull through the vertex without intravenous contrast. Patient has difficulty lying still. COMPARISON:  04/02/2012 FINDINGS: Brain: The ventricles, cisterns and other CSF spaces are within normal. There is minimal age related atrophic change and minimal chronic ischemic microvascular disease. There is no mass, mass effect, shift of midline structures or acute hemorrhage. No evidence to suggest acute infarction. Vascular: Minimal calcified plaque over the cavernous segment of the internal carotid arteries and left vertebral artery. Skull: Within normal. Sinuses/Orbits: Mild opacification over the left mastoid air cells. Orbits are normal. Other: None. IMPRESSION: Acute intracranial findings. Mild chronic ischemic microvascular disease and mild age related atrophic change. Minimal opacification over the left at mastoid air cells. Electronically Signed   By: Marin Olp M.D.   On: 03/05/2016 16:49   Ct Angio Chest Pe W And/or Wo Contrast  Result Date: 03/05/2016 CLINICAL DATA:  Recent shortness-of-breath, weakness and dizziness. Recent falls. EXAM: CT ANGIOGRAPHY CHEST WITH CONTRAST TECHNIQUE: Multidetector CT imaging of the chest was performed using the standard protocol during bolus administration of intravenous contrast. Multiplanar CT image reconstructions and MIPs were obtained to evaluate the vascular anatomy. CONTRAST:  65 mL Isovue 370 IV. COMPARISON:  03/06/2012 FINDINGS: Cardiovascular: There is mild cardiomegaly. Mild calcified plaque over the left anterior descending, lateral circumflex and right coronary arteries. Minimal calcified plaque over the thoracic aorta. Pulmonary arterial  system is normal without evidence of emboli. Mediastinum/Nodes: No evidence of mediastinal or hilar adenopathy. Remaining mediastinal structures are within normal. Lungs/Pleura: Lungs are adequately inflated with mild dependent bibasilar atelectasis. No evidence of effusion. Airways are within normal. Upper Abdomen: Minimal cholelithiasis. Suggestion of hepatic steatosis. Musculoskeletal: Mild compression deformity of approximately T2 age indeterminate. Degenerative change of the spine. Review of the MIP images confirms the above findings. IMPRESSION: No evidence of pulmonary embolism. No acute cardiopulmonary disease. Atherosclerotic coronary artery disease. Minimal aortic atherosclerosis. Minimal compression deformity of T2 age indeterminate. Minimal cholelithiasis.  Suggestion of hepatic steatosis. Electronically Signed   By: Marin Olp M.D.   On: 03/05/2016 20:41    Procedures Procedures (including critical care time)  Medications Ordered in ED Medications  iopamidol (ISOVUE-370) 76 % injection (80 mLs  Contrast Given 03/05/16 2017)  LORazepam (ATIVAN) tablet 2 mg (2 mg Oral Given 03/05/16 2324)     Initial Impression / Assessment and Plan / ED Course  I have reviewed the triage vital signs and the nursing notes.  Pertinent labs & imaging results that were available during my care of the patient were reviewed by me and considered in my medical decision making (see chart for details).     I feel that the patient's symptoms are multifactorial and that his alcohol abuse has a large role in this along with his age and other issues.  The patient is advised plan and all questions were answered.  I advised the patient that he will need to see his primary care doctor.  I advised the patient return here for any worsening in his condition.  His laboratory testing and images were reviewed and there is  no significant abnormalities.  There are some indications that he does have some liver impairment from  his alcohol abuse.  She was able to ambulate here in the emergency department with his normal amount of assistance.  Advised him that he will need to use a walker for further ambulation  Final Clinical Impressions(s) / ED Diagnoses   Final diagnoses:  Dizziness  Lightheadedness    New Prescriptions Discharge Medication List as of 03/06/2016 12:17 AM       Dalia Heading, PA-C 03/06/16 [id number]    Gareth Morgan, MD 03/06/16 1234

## 2016-03-06 NOTE — Discharge Instructions (Signed)
Return here as needed.  Follow-up with your primary care doctor °

## 2016-03-06 NOTE — ED Notes (Signed)
Pt verbalized understanding discharge instructions and denies any further needs or questions at this time. VS stable, ambulatory and steady gait.   

## 2016-03-09 ENCOUNTER — Other Ambulatory Visit: Payer: Self-pay | Admitting: Family Medicine

## 2016-03-09 NOTE — Telephone Encounter (Signed)
Last office visit 02/20/2016 with Gentry Fitz.  Last refilled 12/09/2015 for #90 with no refills.  Ok to refill?

## 2016-03-10 ENCOUNTER — Ambulatory Visit: Payer: Medicare Other | Admitting: Family Medicine

## 2016-03-11 DIAGNOSIS — H5203 Hypermetropia, bilateral: Secondary | ICD-10-CM | POA: Diagnosis not present

## 2016-03-11 DIAGNOSIS — H353132 Nonexudative age-related macular degeneration, bilateral, intermediate dry stage: Secondary | ICD-10-CM | POA: Diagnosis not present

## 2016-03-11 DIAGNOSIS — Z9841 Cataract extraction status, right eye: Secondary | ICD-10-CM | POA: Diagnosis not present

## 2016-03-11 DIAGNOSIS — H01022 Squamous blepharitis right lower eyelid: Secondary | ICD-10-CM | POA: Diagnosis not present

## 2016-03-11 DIAGNOSIS — H01025 Squamous blepharitis left lower eyelid: Secondary | ICD-10-CM | POA: Diagnosis not present

## 2016-03-11 DIAGNOSIS — Z9842 Cataract extraction status, left eye: Secondary | ICD-10-CM | POA: Diagnosis not present

## 2016-03-15 ENCOUNTER — Ambulatory Visit: Payer: Medicare Other | Admitting: Family Medicine

## 2016-03-16 DIAGNOSIS — L120 Bullous pemphigoid: Secondary | ICD-10-CM | POA: Diagnosis not present

## 2016-03-17 ENCOUNTER — Encounter: Payer: Self-pay | Admitting: Family Medicine

## 2016-03-17 ENCOUNTER — Ambulatory Visit (INDEPENDENT_AMBULATORY_CARE_PROVIDER_SITE_OTHER): Payer: Medicare Other | Admitting: Family Medicine

## 2016-03-17 VITALS — BP 122/80 | HR 106 | Temp 98.0°F | Wt 173.0 lb

## 2016-03-17 DIAGNOSIS — F1010 Alcohol abuse, uncomplicated: Secondary | ICD-10-CM

## 2016-03-17 DIAGNOSIS — R42 Dizziness and giddiness: Secondary | ICD-10-CM | POA: Diagnosis not present

## 2016-03-17 NOTE — Progress Notes (Signed)
Pre visit review using our clinic review tool, if applicable. No additional management support is needed unless otherwise documented below in the visit note. 

## 2016-03-17 NOTE — Progress Notes (Signed)
Subjective:   Patient ID: [name], male    DOB: 02-04-1945, 71 y.o.   MRN: [id number]  [name] is a pleasant 71 y.o. year old male who presents to clinic today with his wife Burman Nieves for ER Visit for Fall (Pt fell 03-05-16. Says oxycodone is not helping.) and Jury Summons Note (Asking for a letter ASAP to get out of Jury Duty 03-22-16)  on 03/17/2016  HPI:  Was seen in the ER on 03/05/16 with complaint of dizziness and increased falls over the past 2 weeks. Notes reviewed.  EKG unremarkable.  D dimer elevated, CT angio done- neg. Head CT also neg.  ETOH was 185 with normal ammonia.  Was told likely multifactorial- ETOH abuse a large factor and to follow up with me.  Ct Head Wo Contrast  Result Date: 03/05/2016 CLINICAL DATA:  Frequent falls in the past 5 days with loss of consciousness and dizziness. Head injury. Confusion. EXAM: CT HEAD WITHOUT CONTRAST TECHNIQUE: Contiguous axial images were obtained from the base of the skull through the vertex without intravenous contrast. Patient has difficulty lying still. COMPARISON:  04/02/2012 FINDINGS: Brain: The ventricles, cisterns and other CSF spaces are within normal. There is minimal age related atrophic change and minimal chronic ischemic microvascular disease. There is no mass, mass effect, shift of midline structures or acute hemorrhage. No evidence to suggest acute infarction. Vascular: Minimal calcified plaque over the cavernous segment of the internal carotid arteries and left vertebral artery. Skull: Within normal. Sinuses/Orbits: Mild opacification over the left mastoid air cells. Orbits are normal. Other: None. IMPRESSION: Acute intracranial findings. Mild chronic ischemic microvascular disease and mild age related atrophic change. Minimal opacification over the left at mastoid air cells. Electronically Signed   By: Marin Olp M.D.   On: 03/05/2016 16:49   Ct Angio Chest Pe W And/or Wo Contrast  Result Date:  03/05/2016 CLINICAL DATA:  Recent shortness-of-breath, weakness and dizziness. Recent falls. EXAM: CT ANGIOGRAPHY CHEST WITH CONTRAST TECHNIQUE: Multidetector CT imaging of the chest was performed using the standard protocol during bolus administration of intravenous contrast. Multiplanar CT image reconstructions and MIPs were obtained to evaluate the vascular anatomy. CONTRAST:  65 mL Isovue 370 IV. COMPARISON:  03/06/2012 FINDINGS: Cardiovascular: There is mild cardiomegaly. Mild calcified plaque over the left anterior descending, lateral circumflex and right coronary arteries. Minimal calcified plaque over the thoracic aorta. Pulmonary arterial system is normal without evidence of emboli. Mediastinum/Nodes: No evidence of mediastinal or hilar adenopathy. Remaining mediastinal structures are within normal. Lungs/Pleura: Lungs are adequately inflated with mild dependent bibasilar atelectasis. No evidence of effusion. Airways are within normal. Upper Abdomen: Minimal cholelithiasis. Suggestion of hepatic steatosis. Musculoskeletal: Mild compression deformity of approximately T2 age indeterminate. Degenerative change of the spine. Review of the MIP images confirms the above findings. IMPRESSION: No evidence of pulmonary embolism. No acute cardiopulmonary disease. Atherosclerotic coronary artery disease. Minimal aortic atherosclerosis. Minimal compression deformity of T2 age indeterminate. Minimal cholelithiasis.  Suggestion of hepatic steatosis. Electronically Signed   By: Marin Olp M.D.   On: 03/05/2016 20:41   Dg Shoulder Left  Result Date: 02/20/2016 CLINICAL DATA:  fall x 1 week ago, moderate bruising to left shoulder with decrease in ROM, pt unable to be positioned for axillary view due to condition, in PACS. EXAM: LEFT SHOULDER - 2+ VIEW COMPARISON:  Plain films of the left shoulder dated 10/10/2013 and 01/09/2013. FINDINGS: Osseous alignment is stable. The plate and screw fixation hardware within the  proximal  left humerus appears intact and stable in alignment. Degenerative changes again noted at the glenohumeral and acromioclavicular joint spaces. No acute soft tissue abnormality seen. IMPRESSION: No acute findings. No osseous fracture or dislocation. Fixation hardware within the proximal left humerus appears intact and stable in alignment. Degenerative osteoarthritis at the glenohumeral joint space, stable, at least moderate in degree. Electronically Signed   By: Franki Cabot M.D.   On: 02/20/2016 09:15    Current Outpatient Prescriptions on File Prior to Visit  Medication Sig Dispense Refill  . B Complex Vitamins (VITAMIN B COMPLEX PO) Take 1 tablet by mouth daily.     . cetirizine (ZYRTEC) 10 MG tablet Take 10 mg by mouth daily as needed for allergies.    [name] doxycycline (VIBRA-TABS) 100 MG tablet Take 100 mg by mouth 2 (two) times daily.    [name] loperamide (IMODIUM A-D) 2 MG tablet Take 2 mg by mouth 4 (four) times daily as needed for diarrhea or loose stools.    . Magnesium 500 MG TABS Take 500 mg by mouth daily as needed (energy).     . metoprolol succinate (TOPROL-XL) 25 MG 24 hr tablet Take 1 tablet (25 mg total) by mouth daily. 90 tablet 3  . naproxen sodium (ANAPROX) 220 MG tablet Take 440-880 mg by mouth 2 (two) times daily as needed (PAIN).    [name] oxyCODONE (OXY IR/ROXICODONE) 5 MG immediate release tablet Take 1 tablet (5 mg total) by mouth every 6 (six) hours as needed for severe pain. 30 tablet 0  . predniSONE (DELTASONE) 10 MG tablet Take 5 mg by mouth daily with breakfast.     . promethazine (PHENERGAN) 25 MG tablet TAKE 1 TABLET DAILY AS NEEDED FOR NAUSEA 90 tablet 0  . QUEtiapine (SEROQUEL) 300 MG tablet TAKE 1 TABLET AT BEDTIME 90 tablet 1  . spironolactone (ALDACTONE) 25 MG tablet Take 1 tablet (25 mg total) by mouth daily. 90 tablet 3  . Thiamine HCl (VITAMIN B-1) 100 MG tablet Take 100 mg by mouth daily. Reported on 02/17/2015    . traZODone (DESYREL) 50 MG tablet TAKE 1 TABLET  AT BEDTIME 90 tablet 1  . triamcinolone cream (KENALOG) 0.1 % Apply 1 application topically daily as needed (itching).   3   No current facility-administered medications on file prior to visit.     Allergies  Allergen Reactions  . Nifedipine Swelling    REACTION: SWELLING, 11/02/2011 pt denies this allergy.    Past Medical History:  Diagnosis Date  . Adenomatous polyp of colon 2006  . Allergy    pollen/ragweed  . Anemia 2012  . Anxiety   . Atrial tachycardia, paroxysmal ([place]) 04/07/2015  . BPH (benign prostatic hypertrophy) 5/99  . Chronic bronchitis ([place])    "q year or q other year" (11/02/2011)  . Colon polyp   . DCM (dilated cardiomyopathy) ([place])    EF 45-50% by echo and cath 2017  . Depression    Hx of  . Falls frequently    "daily lately; legs just give out" (11/02/2011)  . GERD (gastroesophageal reflux disease) 07/31/04   gastritis   . H/O hiatal hernia   . H/O nausea   . Hearing difficulty   . Hernia, umbilical    "unrepaired" (11/02/2011)  . History of blood transfusion 2012  . HLD (hyperlipidemia) 5/99  . HTN (hypertension) 5/99  . Humerus fracture 11/02/2011   LUE  . Memory loss    "recently has been forgetting alot; maybe related to the  alcohol" (11/02/2011)  . Mild CAD    10% RCA  . PAC (premature atrial contraction) 04/07/2015  . Peripheral vascular disease ([place])   . Pneumonia 1996   hosp  . Seasonal allergies     Past Surgical History:  Procedure Laterality Date  . CARDIAC CATHETERIZATION N/A 05/23/2015   Procedure: Left Heart Cath and Coronary Angiography;  Surgeon: Troy Sine, MD;  Location: [facility];  Service: Cardiovascular;  Laterality: N/A;  . CATARACT EXTRACTION W/ INTRAOCULAR LENS  IMPLANT, BILATERAL  10/2002  . COLONOSCOPY  07/31/04   multiple polyps; repeat in 3 years  . ESOPHAGEAL VARICE LIGATION  2012  . ETT myoview  03/09/07   nml EF 66%  . HEMORRHOID SURGERY     "cauterized a long time ago" (11/02/2011)  . hosp CP  R/O'D 2/24  03/08/07  . HUMERUS FRACTURE SURGERY Left 11/04/11   with plate to the shoulder  . L duputryen contractive surgery    . neck MRI  6/04   C5-6 disc abnormality  . ORIF HUMERUS FRACTURE  11/04/2011   Procedure: OPEN REDUCTION INTERNAL FIXATION (ORIF) PROXIMAL HUMERUS FRACTURE;  Surgeon: Rozanna Box, MD;  Location: [facility];  Service: Orthopedics;  Laterality: Left;  . POSTERIOR CERVICAL FUSION/FORAMINOTOMY N/A 03/21/2012   Procedure: POSTERIOR CERVICAL FUSION/FORAMINOTOMY LEVEL ONE;  Surgeon: Charlie Pitter, MD;  Location: [facility];  Service: Neurosurgery;  Laterality: N/A;  POSTERIOR CERVICAL ONE TO CERVICAL TWO FUSION WITH ILIAC CREST GRAFT AND LATERAL MASS SCREWS   . TONSILLECTOMY     "I was young" (11/02/2011)  . UPPER GASTROINTESTINAL ENDOSCOPY      Family History  Problem Relation Age of Onset  . Heart disease Father   . Diabetes Father   . Stroke Father   . Depression Mother   . Heart disease Brother   . Alcohol abuse Brother   . Liver disease Brother     Social History   Social History  . Marital status: Married    Spouse name: N/A  . Number of children: 3  . 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 Smoking status: Never Smoker  . Smokeless tobacco: Never Used  . Alcohol use 1.2 oz/week    2 Glasses of wine per week     Comment: 11/02/2011 "large box of wine q 2 days" 01-27-15 2 glass a day of wine  . Drug use: No  . Sexual activity: No   Other Topics Concern  . Not on file   Social History Narrative   Married (remarried), lives with wife; 3 children, 1 at home.    [employer] - mixes colors       Pt revised designated party release form Kevon Tench, wife [phone] - can leave msg.     Allen Norris 03/04/10.        Would desire CPR.  Would not want prolonged life support if futile.   The PMH, PSH, Social History, Family History, Medications, and allergies have  been reviewed in [facility], and have been updated if relevant.  Review of Systems  Constitutional: Positive for appetite change.  Respiratory: Negative.   Cardiovascular: Negative.   Genitourinary: Negative.   Musculoskeletal: Positive for arthralgias.  Neurological: Positive for dizziness.  Hematological: Negative.   All other systems reviewed and are negative.      Objective:    BP 122/80 (BP Location:  Left Arm, Patient Position: Sitting, Cuff Size: Large)   Pulse (!) 106   Temp 98 F (36.7 C) (Oral)   Wt 173 lb (78.5 kg)   SpO2 99%   BMI 25.55 kg/m   Wt Readings from Last 3 Encounters:  03/17/16 173 lb (78.5 kg)  03/05/16 185 lb (83.9 kg)  02/20/16 185 lb 12.8 oz (84.3 kg)    Physical Exam  Constitutional: He is oriented to person, place, and time.  Appears thinner today, disheveled  HENT:  Head: Normocephalic.  Eyes: Conjunctivae are normal.  Pulmonary/Chest: Effort normal.  Musculoskeletal: Normal range of motion.  Neurological: He is alert and oriented to person, place, and time. No cranial nerve deficit.  Skin: Skin is dry.  Psychiatric: He has a normal mood and affect. His behavior is normal. Judgment and thought content normal.  Nursing note and vitals reviewed.         Assessment & Plan:   Dizziness and giddiness  ETOH abuse No Follow-up on file.

## 2016-03-17 NOTE — Assessment & Plan Note (Signed)
>  25 minutes spent in face to face time with patient, >50% spent in counselling or coordination of care discussing ETOH abuse and his progressive decline. He is not eating well- albumin/protein low, he is losing weight and drinking more.  Not ready to enter a ETOH inpatient rehab despite my urging this and wife's agreement with an inpatient rehab stay. I explained to him how dire I feel this situation is and he says he will consider it and get back to me.

## 2016-03-18 ENCOUNTER — Other Ambulatory Visit: Payer: Self-pay

## 2016-03-18 MED ORDER — CYCLOBENZAPRINE HCL 10 MG PO TABS: 10.0000 mg | ORAL_TABLET | Freq: Every day | ORAL | 1 refills | Status: DC | PRN

## 2016-03-18 NOTE — Telephone Encounter (Signed)
[name] said that she took the jury duty excuse and was told needs more info such as the dx that would hinder pt from serving jury duty. Also will need participant # in the letter; [phone].Call [name] when ready for pick up. Pt also request refill flexeril since hurting so much after falling. Please advise. [facility].

## 2016-03-18 NOTE — Telephone Encounter (Signed)
[facility] sent for flexeril and I will need to addend the letter when I return to the office on Monday.

## 2016-03-25 NOTE — Telephone Encounter (Signed)
Spoke to wife. Letter up front for pickup

## 2016-03-25 NOTE — Telephone Encounter (Signed)
Patient's wife,[name],called.  She was asking about the jury duty letter.  Please call [name] at  [phone] when the letter is ready. Patient needs letter today.

## 2016-03-25 NOTE — Telephone Encounter (Signed)
New letter written and on my desk.

## 2016-04-07 DIAGNOSIS — L120 Bullous pemphigoid: Secondary | ICD-10-CM | POA: Diagnosis not present

## 2016-04-12 DIAGNOSIS — H353132 Nonexudative age-related macular degeneration, bilateral, intermediate dry stage: Secondary | ICD-10-CM | POA: Diagnosis not present

## 2016-04-12 DIAGNOSIS — H40023 Open angle with borderline findings, high risk, bilateral: Secondary | ICD-10-CM | POA: Diagnosis not present

## 2016-04-12 DIAGNOSIS — H35373 Puckering of macula, bilateral: Secondary | ICD-10-CM | POA: Diagnosis not present

## 2016-05-11 ENCOUNTER — Other Ambulatory Visit: Payer: Self-pay | Admitting: Physician Assistant

## 2016-05-11 ENCOUNTER — Other Ambulatory Visit: Payer: Self-pay

## 2016-05-11 MED ORDER — LIDOCAINE 5 % EX PTCH: 1.0000 | MEDICATED_PATCH | CUTANEOUS | 0 refills | Status: DC

## 2016-05-11 NOTE — Telephone Encounter (Signed)
Note left at front desk requesting refill lidocaine patch 5%. Not on current med list; on hx med list; last refilled # 90 on 06/08/13. Last seen 03/17/16.Please advise. [facility].

## 2016-05-11 NOTE — Telephone Encounter (Signed)
Please review for refill. Thanks!  

## 2016-05-12 DIAGNOSIS — L120 Bullous pemphigoid: Secondary | ICD-10-CM | POA: Diagnosis not present

## 2016-05-17 ENCOUNTER — Emergency Department (HOSPITAL_COMMUNITY): Payer: Medicare Other

## 2016-05-17 ENCOUNTER — Encounter (HOSPITAL_COMMUNITY): Payer: Self-pay

## 2016-05-17 ENCOUNTER — Inpatient Hospital Stay (HOSPITAL_COMMUNITY)
Admission: EM | Admit: 2016-05-17 | Discharge: 2016-05-22 | DRG: 812 | Disposition: A | Discharge status: Skilled Nursing Facility | Payer: Medicare Other | Attending: Nephrology | Admitting: Nephrology

## 2016-05-17 DIAGNOSIS — I10 Essential (primary) hypertension: Secondary | ICD-10-CM | POA: Diagnosis present

## 2016-05-17 DIAGNOSIS — R55 Syncope and collapse: Secondary | ICD-10-CM | POA: Diagnosis not present

## 2016-05-17 DIAGNOSIS — S2249XA Multiple fractures of ribs, unspecified side, initial encounter for closed fracture: Secondary | ICD-10-CM | POA: Diagnosis present

## 2016-05-17 DIAGNOSIS — S199XXA Unspecified injury of neck, initial encounter: Secondary | ICD-10-CM | POA: Diagnosis not present

## 2016-05-17 DIAGNOSIS — I739 Peripheral vascular disease, unspecified: Secondary | ICD-10-CM | POA: Diagnosis present

## 2016-05-17 DIAGNOSIS — Y92009 Unspecified place in unspecified non-institutional (private) residence as the place of occurrence of the external cause: Secondary | ICD-10-CM

## 2016-05-17 DIAGNOSIS — K5900 Constipation, unspecified: Secondary | ICD-10-CM | POA: Diagnosis present

## 2016-05-17 DIAGNOSIS — K7030 Alcoholic cirrhosis of liver without ascites: Secondary | ICD-10-CM | POA: Diagnosis present

## 2016-05-17 DIAGNOSIS — G894 Chronic pain syndrome: Secondary | ICD-10-CM | POA: Diagnosis present

## 2016-05-17 DIAGNOSIS — E784 Other hyperlipidemia: Secondary | ICD-10-CM | POA: Diagnosis not present

## 2016-05-17 DIAGNOSIS — F1010 Alcohol abuse, uncomplicated: Secondary | ICD-10-CM | POA: Diagnosis present

## 2016-05-17 DIAGNOSIS — Z8249 Family history of ischemic heart disease and other diseases of the circulatory system: Secondary | ICD-10-CM

## 2016-05-17 DIAGNOSIS — Z818 Family history of other mental and behavioral disorders: Secondary | ICD-10-CM

## 2016-05-17 DIAGNOSIS — L0390 Cellulitis, unspecified: Secondary | ICD-10-CM | POA: Diagnosis present

## 2016-05-17 DIAGNOSIS — Z823 Family history of stroke: Secondary | ICD-10-CM

## 2016-05-17 DIAGNOSIS — S0990XA Unspecified injury of head, initial encounter: Secondary | ICD-10-CM | POA: Diagnosis not present

## 2016-05-17 DIAGNOSIS — S299XXA Unspecified injury of thorax, initial encounter: Secondary | ICD-10-CM | POA: Diagnosis not present

## 2016-05-17 DIAGNOSIS — R404 Transient alteration of awareness: Secondary | ICD-10-CM | POA: Diagnosis not present

## 2016-05-17 DIAGNOSIS — I420 Dilated cardiomyopathy: Secondary | ICD-10-CM | POA: Diagnosis not present

## 2016-05-17 DIAGNOSIS — R42 Dizziness and giddiness: Secondary | ICD-10-CM | POA: Diagnosis not present

## 2016-05-17 DIAGNOSIS — R0781 Pleurodynia: Secondary | ICD-10-CM | POA: Diagnosis not present

## 2016-05-17 DIAGNOSIS — R079 Chest pain, unspecified: Secondary | ICD-10-CM

## 2016-05-17 DIAGNOSIS — D649 Anemia, unspecified: Principal | ICD-10-CM | POA: Diagnosis present

## 2016-05-17 DIAGNOSIS — L120 Bullous pemphigoid: Secondary | ICD-10-CM | POA: Diagnosis not present

## 2016-05-17 DIAGNOSIS — E861 Hypovolemia: Secondary | ICD-10-CM | POA: Diagnosis present

## 2016-05-17 DIAGNOSIS — E871 Hypo-osmolality and hyponatremia: Secondary | ICD-10-CM | POA: Diagnosis present

## 2016-05-17 DIAGNOSIS — Z811 Family history of alcohol abuse and dependence: Secondary | ICD-10-CM

## 2016-05-17 DIAGNOSIS — W1830XA Fall on same level, unspecified, initial encounter: Secondary | ICD-10-CM | POA: Diagnosis present

## 2016-05-17 DIAGNOSIS — D696 Thrombocytopenia, unspecified: Secondary | ICD-10-CM | POA: Diagnosis present

## 2016-05-17 DIAGNOSIS — S51012A Laceration without foreign body of left elbow, initial encounter: Secondary | ICD-10-CM | POA: Diagnosis not present

## 2016-05-17 DIAGNOSIS — S2242XA Multiple fractures of ribs, left side, initial encounter for closed fracture: Secondary | ICD-10-CM | POA: Diagnosis present

## 2016-05-17 DIAGNOSIS — R296 Repeated falls: Secondary | ICD-10-CM | POA: Diagnosis present

## 2016-05-17 DIAGNOSIS — Z833 Family history of diabetes mellitus: Secondary | ICD-10-CM

## 2016-05-17 DIAGNOSIS — K219 Gastro-esophageal reflux disease without esophagitis: Secondary | ICD-10-CM | POA: Diagnosis present

## 2016-05-17 DIAGNOSIS — Z87891 Personal history of nicotine dependence: Secondary | ICD-10-CM

## 2016-05-17 DIAGNOSIS — F411 Generalized anxiety disorder: Secondary | ICD-10-CM | POA: Diagnosis present

## 2016-05-17 DIAGNOSIS — E872 Acidosis: Secondary | ICD-10-CM | POA: Diagnosis present

## 2016-05-17 DIAGNOSIS — F1020 Alcohol dependence, uncomplicated: Secondary | ICD-10-CM | POA: Diagnosis present

## 2016-05-17 DIAGNOSIS — N400 Enlarged prostate without lower urinary tract symptoms: Secondary | ICD-10-CM | POA: Diagnosis present

## 2016-05-17 DIAGNOSIS — E785 Hyperlipidemia, unspecified: Secondary | ICD-10-CM | POA: Diagnosis present

## 2016-05-17 DIAGNOSIS — K700 Alcoholic fatty liver: Secondary | ICD-10-CM | POA: Diagnosis present

## 2016-05-17 DIAGNOSIS — S2239XA Fracture of one rib, unspecified side, initial encounter for closed fracture: Secondary | ICD-10-CM | POA: Diagnosis present

## 2016-05-17 HISTORY — DX: Unspecified hearing loss, unspecified ear: H91.90

## 2016-05-17 HISTORY — DX: Unspecified osteoarthritis, unspecified site: M19.90

## 2016-05-17 HISTORY — DX: Cardiac murmur, unspecified: R01.1

## 2016-05-17 LAB — URINALYSIS, ROUTINE W REFLEX MICROSCOPIC
Bilirubin Urine: NEGATIVE
Glucose, UA: NEGATIVE mg/dL
Hgb urine dipstick: NEGATIVE
KETONES UR: NEGATIVE mg/dL
LEUKOCYTES UA: NEGATIVE
NITRITE: NEGATIVE
PROTEIN: NEGATIVE mg/dL
Specific Gravity, Urine: 1.017 (ref 1.005–1.030)
pH: 7 (ref 5.0–8.0)

## 2016-05-17 LAB — COMPREHENSIVE METABOLIC PANEL
ALBUMIN: 3.2 g/dL — AB (ref 3.5–5.0)
ALK PHOS: 56 U/L (ref 38–126)
ALT: 31 U/L (ref 17–63)
ANION GAP: 12 (ref 5–15)
AST: 32 U/L (ref 15–41)
BUN: 47 mg/dL — ABNORMAL HIGH (ref 6–20)
CALCIUM: 9.2 mg/dL (ref 8.9–10.3)
CHLORIDE: 89 mmol/L — AB (ref 101–111)
CO2: 22 mmol/L (ref 22–32)
Creatinine, Ser: 1.04 mg/dL (ref 0.61–1.24)
GFR calc Af Amer: >60 mL/min (ref >60)
GFR calc non Af Amer: >60 mL/min (ref >60)
GLUCOSE: 114 mg/dL — AB (ref 65–99)
Potassium: 4.8 mmol/L (ref 3.5–5.1)
Sodium: 123 mmol/L — ABNORMAL LOW (ref 135–145)
Total Bilirubin: 1.1 mg/dL (ref 0.3–1.2)
Total Protein: 5.4 g/dL — ABNORMAL LOW (ref 6.5–8.1)

## 2016-05-17 LAB — BASIC METABOLIC PANEL
Anion gap: 8 (ref 5–15)
BUN: 43 mg/dL — AB (ref 6–20)
CHLORIDE: 90 mmol/L — AB (ref 101–111)
CO2: 25 mmol/L (ref 22–32)
CREATININE: 0.98 mg/dL (ref 0.61–1.24)
Calcium: 8.6 mg/dL — ABNORMAL LOW (ref 8.9–10.3)
GFR calc Af Amer: >60 mL/min (ref >60)
GFR calc non Af Amer: >60 mL/min (ref >60)
Glucose, Bld: 116 mg/dL — ABNORMAL HIGH (ref 65–99)
Potassium: 4.3 mmol/L (ref 3.5–5.1)
Sodium: 123 mmol/L — ABNORMAL LOW (ref 135–145)

## 2016-05-17 LAB — CBC WITH DIFFERENTIAL/PLATELET
BASOS PCT: 0 %
Basophils Absolute: 0 10*3/uL (ref 0.0–0.1)
EOS PCT: 0 %
Eosinophils Absolute: 0 10*3/uL (ref 0.0–0.7)
HEMATOCRIT: 22.5 % — AB (ref 39.0–52.0)
HEMOGLOBIN: 8.2 g/dL — AB (ref 13.0–17.0)
Lymphocytes Relative: 14 %
Lymphs Abs: 2.4 10*3/uL (ref 0.7–4.0)
MCH: 32.2 pg (ref 26.0–34.0)
MCHC: 36.4 g/dL — ABNORMAL HIGH (ref 30.0–36.0)
MCV: 88.2 fL (ref 78.0–100.0)
MONO ABS: 1.4 10*3/uL — AB (ref 0.1–1.0)
MONOS PCT: 8 %
NEUTROS PCT: 78 %
Neutro Abs: 13.5 10*3/uL — ABNORMAL HIGH (ref 1.7–7.7)
PLATELETS: 217 10*3/uL (ref 150–400)
RBC: 2.55 MIL/uL — ABNORMAL LOW (ref 4.22–5.81)
RDW: 14.2 % (ref 11.5–15.5)
WBC: 17.3 10*3/uL — ABNORMAL HIGH (ref 4.0–10.5)

## 2016-05-17 LAB — VITAMIN B12: VITAMIN B 12: 6041 pg/mL — AB (ref 180–914)

## 2016-05-17 LAB — IRON AND TIBC
Iron: 142 ug/dL (ref 45–182)
SATURATION RATIOS: 92 % — AB (ref 17.9–39.5)
TIBC: 154 ug/dL — ABNORMAL LOW (ref 250–450)
UIBC: 12 ug/dL

## 2016-05-17 LAB — TROPONIN I: Troponin I: <0.03 ng/mL (ref <0.03)

## 2016-05-17 LAB — SODIUM, URINE, RANDOM: SODIUM UR: 16 mmol/L

## 2016-05-17 LAB — OSMOLALITY, URINE: OSMOLALITY UR: 620 mosm/kg (ref 300–900)

## 2016-05-17 LAB — RETICULOCYTES
RBC.: 2.18 MIL/uL — AB (ref 4.22–5.81)
RETIC COUNT ABSOLUTE: 58.9 10*3/uL (ref 19.0–186.0)
Retic Ct Pct: 2.7 % (ref 0.4–3.1)

## 2016-05-17 LAB — OSMOLALITY: OSMOLALITY: 278 mosm/kg (ref 275–295)

## 2016-05-17 LAB — ETHANOL

## 2016-05-17 LAB — FOLATE: FOLATE: 15.1 ng/mL (ref >5.9)

## 2016-05-17 LAB — FERRITIN: Ferritin: 421 ng/mL — ABNORMAL HIGH (ref 24–336)

## 2016-05-17 LAB — LIPASE, BLOOD: LIPASE: 26 U/L (ref 11–51)

## 2016-05-17 MED ORDER — LORAZEPAM 2 MG/ML IJ SOLN
1.0000 mg | Freq: Four times a day (QID) | INTRAMUSCULAR | Status: AC | PRN
  Filled 2016-05-17: qty 1

## 2016-05-17 MED ORDER — LORAZEPAM 2 MG/ML IJ SOLN
1.0000 mg | Freq: Once | INTRAMUSCULAR | Status: AC
  Administered 2016-05-17: 1 mg via INTRAVENOUS
  Filled 2016-05-17: qty 1

## 2016-05-17 MED ORDER — SODIUM CHLORIDE 0.9% FLUSH
3.0000 mL | Freq: Two times a day (BID) | INTRAVENOUS | Status: DC
  Administered 2016-05-18 – 2016-05-22 (×8): 3 mL via INTRAVENOUS

## 2016-05-17 MED ORDER — METOPROLOL SUCCINATE ER 25 MG PO TB24
25.0000 mg | ORAL_TABLET | Freq: Every day | ORAL | Status: DC
  Administered 2016-05-18 – 2016-05-22 (×5): 25 mg via ORAL
  Filled 2016-05-17 (×5): qty 1

## 2016-05-17 MED ORDER — LORATADINE 10 MG PO TABS
10.0000 mg | ORAL_TABLET | Freq: Every day | ORAL | Status: DC
  Administered 2016-05-18 – 2016-05-22 (×5): 10 mg via ORAL
  Filled 2016-05-17 (×5): qty 1

## 2016-05-17 MED ORDER — DOXYCYCLINE HYCLATE 100 MG PO TABS: 100.0000 mg | ORAL_TABLET | Freq: Two times a day (BID) | ORAL | Status: DC

## 2016-05-17 MED ORDER — B COMPLEX-C PO TABS
1.0000 | ORAL_TABLET | Freq: Every day | ORAL | Status: DC
  Administered 2016-05-17 – 2016-05-22 (×6): 1 via ORAL
  Filled 2016-05-17 (×7): qty 1

## 2016-05-17 MED ORDER — VITAMIN B-1 100 MG PO TABS
100.0000 mg | ORAL_TABLET | Freq: Every day | ORAL | Status: DC
  Administered 2016-05-17 – 2016-05-22 (×6): 100 mg via ORAL
  Filled 2016-05-17 (×6): qty 1

## 2016-05-17 MED ORDER — PROCHLORPERAZINE EDISYLATE 5 MG/ML IJ SOLN
5.0000 mg | Freq: Once | INTRAMUSCULAR | Status: AC
  Administered 2016-05-17: 5 mg via INTRAVENOUS
  Filled 2016-05-17: qty 2

## 2016-05-17 MED ORDER — PREDNISONE 5 MG PO TABS: 5.0000 mg | ORAL_TABLET | Freq: Every day | ORAL | Status: DC

## 2016-05-17 MED ORDER — SODIUM CHLORIDE 0.9 % IV SOLN
INTRAVENOUS | Status: DC
  Administered 2016-05-17 – 2016-05-18 (×4): via INTRAVENOUS

## 2016-05-17 MED ORDER — MAGNESIUM 500 MG PO TABS: 500.0000 mg | ORAL_TABLET | Freq: Every day | ORAL | Status: DC | PRN

## 2016-05-17 MED ORDER — DOXYCYCLINE HYCLATE 100 MG PO TABS
100.0000 mg | ORAL_TABLET | Freq: Two times a day (BID) | ORAL | Status: DC
Start: 2016-05-17 — End: 2016-05-17
  Filled 2016-05-17: qty 1

## 2016-05-17 MED ORDER — VITAMIN B-1 100 MG PO TABS: 100.0000 mg | ORAL_TABLET | Freq: Every day | ORAL | Status: DC

## 2016-05-17 MED ORDER — HYDROMORPHONE HCL 1 MG/ML IJ SOLN
0.5000 mg | INTRAMUSCULAR | Status: DC | PRN
  Administered 2016-05-17 – 2016-05-19 (×6): 0.5 mg via INTRAVENOUS
  Filled 2016-05-17 (×8): qty 1

## 2016-05-17 MED ORDER — TRAZODONE HCL 50 MG PO TABS
50.0000 mg | ORAL_TABLET | Freq: Every day | ORAL | Status: DC
  Administered 2016-05-17 – 2016-05-21 (×5): 50 mg via ORAL
  Filled 2016-05-17 (×5): qty 1

## 2016-05-17 MED ORDER — PREDNISONE 20 MG PO TABS
20.0000 mg | ORAL_TABLET | Freq: Every day | ORAL | Status: AC
  Administered 2016-05-18 – 2016-05-19 (×2): 20 mg via ORAL
  Filled 2016-05-17 (×2): qty 1

## 2016-05-17 MED ORDER — DOXYCYCLINE HYCLATE 100 MG PO TABS
100.0000 mg | ORAL_TABLET | Freq: Two times a day (BID) | ORAL | Status: DC
  Administered 2016-05-18 – 2016-05-22 (×9): 100 mg via ORAL
  Filled 2016-05-17 (×9): qty 1

## 2016-05-17 MED ORDER — THIAMINE HCL 100 MG/ML IJ SOLN: 100.0000 mg | Freq: Every day | INTRAMUSCULAR | Status: DC

## 2016-05-17 MED ORDER — VITAMIN B COMPLEX PO TABS: ORAL_TABLET | Freq: Every day | ORAL | Status: DC

## 2016-05-17 MED ORDER — CYCLOBENZAPRINE HCL 10 MG PO TABS: 10.0000 mg | ORAL_TABLET | Freq: Every day | ORAL | Status: DC | PRN

## 2016-05-17 MED ORDER — QUETIAPINE FUMARATE 50 MG PO TABS
300.0000 mg | ORAL_TABLET | Freq: Every day | ORAL | Status: DC
  Administered 2016-05-17 – 2016-05-21 (×5): 300 mg via ORAL
  Filled 2016-05-17 (×5): qty 6

## 2016-05-17 MED ORDER — ADULT MULTIVITAMIN W/MINERALS CH: 1.0000 | ORAL_TABLET | Freq: Every day | ORAL | Status: DC

## 2016-05-17 MED ORDER — FENTANYL CITRATE (PF) 100 MCG/2ML IJ SOLN
50.0000 ug | Freq: Once | INTRAMUSCULAR | Status: AC
  Administered 2016-05-17: 50 ug via INTRAVENOUS
  Filled 2016-05-17: qty 2

## 2016-05-17 MED ORDER — ONDANSETRON HCL 4 MG/2ML IJ SOLN
4.0000 mg | INTRAMUSCULAR | Status: DC | PRN
  Administered 2016-05-17 – 2016-05-19 (×5): 4 mg via INTRAVENOUS
  Filled 2016-05-17 (×5): qty 2

## 2016-05-17 MED ORDER — NAPROXEN 500 MG PO TABS
500.0000 mg | ORAL_TABLET | Freq: Two times a day (BID) | ORAL | Status: DC | PRN
  Filled 2016-05-17: qty 1

## 2016-05-17 MED ORDER — OXYCODONE HCL 5 MG PO TABS
5.0000 mg | ORAL_TABLET | Freq: Four times a day (QID) | ORAL | Status: DC | PRN
  Administered 2016-05-17 – 2016-05-22 (×12): 5 mg via ORAL
  Filled 2016-05-17 (×12): qty 1

## 2016-05-17 MED ORDER — LOPERAMIDE HCL 2 MG PO TABS: 2.0000 mg | ORAL_TABLET | Freq: Four times a day (QID) | ORAL | Status: DC | PRN

## 2016-05-17 MED ORDER — ENSURE ENLIVE PO LIQD
237.0000 mL | Freq: Two times a day (BID) | ORAL | Status: DC
  Administered 2016-05-18 (×2): 237 mL via ORAL

## 2016-05-17 MED ORDER — NAPROXEN SODIUM 275 MG PO TABS: 440.0000 mg | ORAL_TABLET | Freq: Two times a day (BID) | ORAL | Status: DC | PRN

## 2016-05-17 MED ORDER — LORAZEPAM 1 MG PO TABS
1.0000 mg | ORAL_TABLET | Freq: Four times a day (QID) | ORAL | Status: AC | PRN
  Administered 2016-05-19: 1 mg via ORAL
  Filled 2016-05-17: qty 1

## 2016-05-17 MED ORDER — FOLIC ACID 1 MG PO TABS
1.0000 mg | ORAL_TABLET | Freq: Every day | ORAL | Status: DC
  Administered 2016-05-17 – 2016-05-22 (×6): 1 mg via ORAL
  Filled 2016-05-17 (×6): qty 1

## 2016-05-17 MED ORDER — ENOXAPARIN SODIUM 40 MG/0.4ML ~~LOC~~ SOLN
40.0000 mg | SUBCUTANEOUS | Status: DC
  Administered 2016-05-17 – 2016-05-18 (×2): 40 mg via SUBCUTANEOUS
  Filled 2016-05-17 (×2): qty 0.4

## 2016-05-17 NOTE — ED Triage Notes (Signed)
Pt from home for a fall by [facility]. Pt had a fall at home unknown if pt passed out. Pt has no new pain and only complains of his foot hurting which has been on going for 2 months.

## 2016-05-17 NOTE — ED Notes (Signed)
Patient transported to X-ray 

## 2016-05-17 NOTE — ED Notes (Signed)
Patient transported to CT 

## 2016-05-17 NOTE — ED Provider Notes (Signed)
[facility] DEPT Provider Note   CSN: [id number] Arrival date & time: 05/17/16  0920     History   Chief Complaint Chief Complaint  Patient presents with  . Fall    HPI [name] is a 71 y.o. male.  HPI  Patient presents with his brother-in-law who assists with the history of present illness. Patient notes multiple medical issues, alcoholism, but today presents after a fall. In talking with the brother-in-law discloses the patient has had multiple falls, including one yesterday, another one today. Patient recalls feeling lightheaded, passing out earlier today, falling onto his left side. Since that time the patient has had persistent weakness, not notably changed from baseline, but has had increasing pain on the left axilla.  Patient also patient has a notable recent diagnosis of bullous pemphigoid, has been receiving steroid therapy for substantial changes to his left foot. He notes that this pain is unchanged, severe, sore, diffuse, slightly improved since initiation of steroid therapy. Family notes that the patient has poor recall, drinks substantially, greater than 1 L of wine each day.     Past Medical History:  Diagnosis Date  . Adenomatous polyp of colon 2006  . Allergy    pollen/ragweed  . Anemia 2012  . Anxiety   . Atrial tachycardia, paroxysmal ([place]) 04/07/2015  . BPH (benign prostatic hypertrophy) 5/99  . Chronic bronchitis ([place])    "q year or q other year" (11/02/2011)  . Colon polyp   . DCM (dilated cardiomyopathy) ([place])    EF 45-50% by echo and cath 2017  . Depression    Hx of  . Falls frequently    "daily lately; legs just give out" (11/02/2011)  . GERD (gastroesophageal reflux disease) 07/31/04   gastritis   . H/O hiatal hernia   . H/O nausea   . Hearing difficulty   . Hernia, umbilical    "unrepaired" (11/02/2011)  . History of blood transfusion 2012  . HLD (hyperlipidemia) 5/99  . HTN (hypertension) 5/99  . Humerus fracture 11/02/2011    LUE  . Memory loss    "recently has been forgetting alot; maybe related to the alcohol" (11/02/2011)  . Mild CAD    10% RCA  . PAC (premature atrial contraction) 04/07/2015  . Peripheral vascular disease ([place])   . Pneumonia 1996   hosp  . Seasonal allergies     Patient Active Problem List   Diagnosis Date Noted  . ETOH abuse 03/17/2016  . Dizziness and giddiness 12/09/2015  . Chronic pain syndrome 12/09/2015  . Arthritis 11/20/2015  . Mild CAD   . DCM (dilated cardiomyopathy) ([place])   . Abnormal nuclear stress test 05/07/2015  . PAC (premature atrial contraction) 04/07/2015  . Atrial tachycardia, paroxysmal ([place]) 03/10/2015  . Depression 04/12/2012  . C2 cervical fracture ([place]) 02/29/2012  . Fracture of humerus, proximal, left, closed 11/02/2011  . Hyponatremia 11/17/2010  . H/O: upper GI bleed 10/20/2010  . Stomach ulcer from aspirin/ibuprofen-like drugs (NSAID's) 10/20/2010  . Colon polyp 08/26/2010  . Diverticulosis of colon (without mention of hemorrhage) 08/26/2010  . BPH (benign prostatic hyperplasia) 07/02/2010  . Deficiency anemia 03/09/2010  . Hyposmolality and/or hyponatremia 10/17/2009  . ALCOHOLIC CIRRHOSIS OF LIVER 07/25/2009  . ANXIETY STATE, UNSPECIFIED 01/24/2007  . HLD (hyperlipidemia) 07/28/2006  . Alcohol abuse 07/28/2006  . Essential hypertension 07/28/2006  . IMPOTENCE, ORGANIC ORIGIN 07/28/2006    Past Surgical History:  Procedure Laterality Date  . CARDIAC CATHETERIZATION N/A 05/23/2015   Procedure: Left Heart Cath  and Coronary Angiography;  Surgeon: Troy Sine, MD;  Location: [facility];  Service: Cardiovascular;  Laterality: N/A;  . CATARACT EXTRACTION W/ INTRAOCULAR LENS  IMPLANT, BILATERAL  10/2002  . COLONOSCOPY  07/31/04   multiple polyps; repeat in 3 years  . ESOPHAGEAL VARICE LIGATION  2012  . ETT myoview  03/09/07   nml EF 66%  . HEMORRHOID SURGERY     "cauterized a long time ago" (11/02/2011)  . hosp CP R/O'D 2/24   03/08/07  . HUMERUS FRACTURE SURGERY Left 11/04/11   with plate to the shoulder  . L duputryen contractive surgery    . neck MRI  6/04   C5-6 disc abnormality  . ORIF HUMERUS FRACTURE  11/04/2011   Procedure: OPEN REDUCTION INTERNAL FIXATION (ORIF) PROXIMAL HUMERUS FRACTURE;  Surgeon: Rozanna Box, MD;  Location: [facility];  Service: Orthopedics;  Laterality: Left;  . POSTERIOR CERVICAL FUSION/FORAMINOTOMY N/A 03/21/2012   Procedure: POSTERIOR CERVICAL FUSION/FORAMINOTOMY LEVEL ONE;  Surgeon: Charlie Pitter, MD;  Location: [facility];  Service: Neurosurgery;  Laterality: N/A;  POSTERIOR CERVICAL ONE TO CERVICAL TWO FUSION WITH ILIAC CREST GRAFT AND LATERAL MASS SCREWS   . TONSILLECTOMY     "I was young" (11/02/2011)  . UPPER GASTROINTESTINAL ENDOSCOPY         Home Medications    Prior to Admission medications   Medication Sig Start Date End Date Taking? Authorizing Provider  B Complex Vitamins (VITAMIN B COMPLEX PO) Take 1 tablet by mouth daily.     [provider]  cetirizine (ZYRTEC) 10 MG tablet Take 10 mg by mouth daily as needed for allergies.    [provider]  cyclobenzaprine (FLEXERIL) 10 MG tablet Take 1 tablet (10 mg total) by mouth daily as needed for muscle spasms. 03/18/16   Lucille Passy, MD  doxycycline (VIBRA-TABS) 100 MG tablet Take 100 mg by mouth 2 (two) times daily. 02/27/16   [provider]  lidocaine (LIDODERM) 5 % Place 1 patch onto the skin daily. Remove & Discard patch within 12 hours or as directed by MD 05/11/16   Lucille Passy, MD  loperamide (IMODIUM A-D) 2 MG tablet Take 2 mg by mouth 4 (four) times daily as needed for diarrhea or loose stools.    [provider]  Magnesium 500 MG TABS Take 500 mg by mouth daily as needed (energy).     [provider]  naproxen sodium (ANAPROX) 220 MG tablet Take 440-880 mg by mouth 2 (two) times daily as needed (PAIN).    [provider]  oxyCODONE (OXY IR/ROXICODONE) 5 MG  immediate release tablet Take 1 tablet (5 mg total) by mouth every 6 (six) hours as needed for severe pain. 02/20/16   Pleas Koch, NP  predniSONE (DELTASONE) 10 MG tablet Take 5 mg by mouth daily with breakfast.     [provider]  promethazine (PHENERGAN) 25 MG tablet TAKE 1 TABLET DAILY AS NEEDED FOR NAUSEA 03/09/16   Lucille Passy, MD  QUEtiapine (SEROQUEL) 300 MG tablet TAKE 1 TABLET AT BEDTIME 03/09/16   Lucille Passy, MD  spironolactone (ALDACTONE) 25 MG tablet Take 1 tablet (25 mg total) by mouth daily. 12/22/15   Lucille Passy, MD  Thiamine HCl (VITAMIN B-1) 100 MG tablet Take 100 mg by mouth daily. Reported on 02/17/2015    [provider]  TOPROL XL 25 MG 24 hr tablet TAKE 1 TABLET DAILY 05/11/16   Fransico Him  R, MD  traZODone (DESYREL) 50 MG tablet TAKE 1 TABLET AT BEDTIME 03/09/16   Lucille Passy, MD  triamcinolone cream (KENALOG) 0.1 % Apply 1 application topically daily as needed (itching).  03/12/15   [provider]    Family History Family History  Problem Relation Age of Onset  . Heart disease Father   . Diabetes Father   . Stroke Father   . Depression Mother   . Heart disease Brother   . Alcohol abuse Brother   . Liver disease Brother     Social History Social History  Substance Use Topics  . Smoking status: Never Smoker  . Smokeless tobacco: Never Used  . Alcohol use 1.2 oz/week    2 Glasses of wine per week     Comment: 11/02/2011 "large box of wine q 2 days" 01-27-15 2 glass a day of wine     Allergies   Nifedipine   Review of Systems Review of Systems  Unable to perform ROS: Other  Review limited by the patient's inability to recall events prior to today, otherwise review consistent with details provided by the family member.    Physical Exam Updated Vital Signs BP 120/75   Pulse 99   Temp 98 F (36.7 C) (Oral)   Resp 17   Ht 5\' 9"  (1.753 m)   Wt 185 lb (83.9 kg)   SpO2 99%   BMI 27.32 kg/m   Physical Exam    Constitutional:  Obese, sickly appearing elderly male  HENT:  Head: Normocephalic and atraumatic.  Eyes: Conjunctivae and EOM are normal.  Neck: Muscular tenderness present. No neck rigidity. Normal range of motion present.  No focal tenderness, no deformity, the patient describes pain throughout the neck.  Cardiovascular: Normal rate and regular rhythm.   Pulmonary/Chest: No stridor. No respiratory distress.    Abdominal: He exhibits no distension.  Musculoskeletal: He exhibits no edema.  Neurological: He is alert. He displays no tremor. He displays no seizure activity.  Patient has delayed response, but speech is brief, clear. Poor recall, beyond his most recent events. No gross discoordination.  Skin: Skin is warm and dry.     Psychiatric: He has a normal mood and affect. His speech is delayed. Cognition and memory are impaired.  Nursing note and vitals reviewed.    ED Treatments / Results  Labs (all labs ordered are listed, but only abnormal results are displayed) Labs Reviewed  COMPREHENSIVE METABOLIC PANEL - Abnormal; Notable for the following:       Result Value   Sodium 123 (*)    Chloride 89 (*)    Glucose, Bld 114 (*)    BUN 47 (*)    Total Protein 5.4 (*)    Albumin 3.2 (*)    All other components within normal limits  CBC WITH DIFFERENTIAL/PLATELET - Abnormal; Notable for the following:    WBC 17.3 (*)    RBC 2.55 (*)    Hemoglobin 8.2 (*)    HCT 22.5 (*)    MCHC 36.4 (*)    Neutro Abs 13.5 (*)    Monocytes Absolute 1.4 (*)    All other components within normal limits  TROPONIN I  ETHANOL  LIPASE, BLOOD  CBG MONITORING, ED    EKG  EKG Interpretation  Date/Time:  Monday May 17 2016 09:30:36 EDT Ventricular Rate:  94 PR Interval:    QRS Duration: 103 QT Interval:  354 QTC Calculation: 443 R Axis:   21 Text Interpretation:  Sinus rhythm Atrial fibrillation T wave abnormality Abnormal ekg Confirmed by Carmin Muskrat  MD [id number]) on 05/17/2016  10:17:42 AM       Radiology Dg Chest 2 View  Result Date: 05/17/2016 CLINICAL DATA:  Syncopal episode.  Fall with pain in the left ribs. EXAM: CHEST  2 VIEW COMPARISON:  CT of the chest 03/05/2016 and two-view chest x-ray 05/16/2015. FINDINGS: The heart is enlarged. There is no edema or effusion to suggest failure. Lung volumes are low. Posterior left second, third, and fourth rib fractures are suspected. Asymmetric pleural thickening or fluid is noted at the left apex. IMPRESSION: 1. Suspected left posterior second, third, and fourth rib fractures. Recommend CT of the chest without contrast for further evaluation. 2. Asymmetric left apical pleural thickening or fluid likely related to the acute fractures. Electronically Signed   By: San Morelle M.D.   On: 05/17/2016 10:15   Ct Head Wo Contrast  Result Date: 05/17/2016 CLINICAL DATA:  Status post fall. Unknown if the loss consciousness. Syncope. EXAM: CT HEAD WITHOUT CONTRAST CT CERVICAL SPINE WITHOUT CONTRAST TECHNIQUE: Multidetector CT imaging of the head and cervical spine was performed following the standard protocol without intravenous contrast. Multiplanar CT image reconstructions of the cervical spine were also generated. COMPARISON:  03/05/2016 FINDINGS: CT HEAD FINDINGS Brain: No evidence of acute infarction, hemorrhage, extra-axial collection, ventriculomegaly, or mass effect. Generalized cerebral atrophy. Periventricular white matter low attenuation likely secondary to microangiopathy. Vascular: Cerebrovascular atherosclerotic calcifications are noted. Skull: Negative for fracture or focal lesion. Sinuses/Orbits: Visualized portions of the orbits are unremarkable. Visualized portions of the paranasal sinuses and mastoid air cells are unremarkable. Other: None. CT CERVICAL SPINE FINDINGS Alignment: Normal. Skull base and vertebrae: No acute fracture. No primary bone lesion or focal pathologic process. Ununited type 1 fracture of the  odontoid process with posterior fusion of C2-3 with bilateral pedicle screws and a cerclage wire. Soft tissues and spinal canal: No prevertebral fluid or swelling. No visible canal hematoma. Disc levels: Degenerative disc disease with mild disc height loss at C5-6. Posterior spinal fusion at C2-3 with moderate left facet arthropathy and left foraminal stenosis. No hardware failure or complication. Moderate bilateral facet arthropathy at C3-4 and C4-5. Bilateral foraminal narrowing at C4-5. Broad-based disc osteophyte complex at C5-6 with bilateral uncovertebral degenerative changes and moderate bilateral foraminal stenosis. Other: No fluid collection or hematoma. Bilateral carotid artery atherosclerosis. IMPRESSION: 1. No acute intracranial pathology. 2. No acute osseous injury the cervical spine. 3. Cervical spine spondylosis as described above. 4. Chronic ununited type 1 fracture of the odontoid process with posterior fusion at C2-3 without failure or complication. Electronically Signed   By: Kathreen Devoid   On: 05/17/2016 11:18   Ct Chest Wo Contrast  Result Date: 05/17/2016 CLINICAL DATA:  Fall, syncope EXAM: CT CHEST WITHOUT CONTRAST TECHNIQUE: Multidetector CT imaging of the chest was performed following the standard protocol without IV contrast. COMPARISON:  Chest CT 03/05/2016.  Chest x-ray earlier today. FINDINGS: Cardiovascular: Heart is borderline in size. Extensive coronary artery and aortic calcifications. No evidence of aortic aneurysm. Mediastinum/Nodes: No mediastinal, hilar, or axillary adenopathy. Trachea and esophagus are unremarkable. Lungs/Pleura: No confluent airspace opacities or pleural effusions. No pneumothorax. Upper Abdomen: Layering gallstones within the gallbladder. No acute findings. Musculoskeletal: Bilateral gynecomastia. Multiple left rib fractures are noted involving the left second through eighth ribs. The second and third rib fractures are comminuted. There is no associated  pneumothorax. Overlying pleural or extrapleural soft tissue thickening noted in the lateral  left apex. Multiple old healed posterior right rib fractures. IMPRESSION: Left second through eighth rib fractures posterolaterally. No associated left effusion or pneumothorax. Cardiomegaly. Coronary artery disease, aortic atherosclerosis. Bilateral gynecomastia. Electronically Signed   By: Rolm Baptise M.D.   On: 05/17/2016 11:11   Ct Cervical Spine Wo Contrast  Result Date: 05/17/2016 CLINICAL DATA:  Status post fall. Unknown if the loss consciousness. Syncope. EXAM: CT HEAD WITHOUT CONTRAST CT CERVICAL SPINE WITHOUT CONTRAST TECHNIQUE: Multidetector CT imaging of the head and cervical spine was performed following the standard protocol without intravenous contrast. Multiplanar CT image reconstructions of the cervical spine were also generated. COMPARISON:  03/05/2016 FINDINGS: CT HEAD FINDINGS Brain: No evidence of acute infarction, hemorrhage, extra-axial collection, ventriculomegaly, or mass effect. Generalized cerebral atrophy. Periventricular white matter low attenuation likely secondary to microangiopathy. Vascular: Cerebrovascular atherosclerotic calcifications are noted. Skull: Negative for fracture or focal lesion. Sinuses/Orbits: Visualized portions of the orbits are unremarkable. Visualized portions of the paranasal sinuses and mastoid air cells are unremarkable. Other: None. CT CERVICAL SPINE FINDINGS Alignment: Normal. Skull base and vertebrae: No acute fracture. No primary bone lesion or focal pathologic process. Ununited type 1 fracture of the odontoid process with posterior fusion of C2-3 with bilateral pedicle screws and a cerclage wire. Soft tissues and spinal canal: No prevertebral fluid or swelling. No visible canal hematoma. Disc levels: Degenerative disc disease with mild disc height loss at C5-6. Posterior spinal fusion at C2-3 with moderate left facet arthropathy and left foraminal stenosis. No  hardware failure or complication. Moderate bilateral facet arthropathy at C3-4 and C4-5. Bilateral foraminal narrowing at C4-5. Broad-based disc osteophyte complex at C5-6 with bilateral uncovertebral degenerative changes and moderate bilateral foraminal stenosis. Other: No fluid collection or hematoma. Bilateral carotid artery atherosclerosis. IMPRESSION: 1. No acute intracranial pathology. 2. No acute osseous injury the cervical spine. 3. Cervical spine spondylosis as described above. 4. Chronic ununited type 1 fracture of the odontoid process with posterior fusion at C2-3 without failure or complication. Electronically Signed   By: Kathreen Devoid   On: 05/17/2016 11:18   After the initial evaluation with concern for multiple fractures, the patient had CT scan performed of his chest. This was notable for demonstrating 8 fractures, no pneumothorax. Patient's pain on physical exam is present, but not as severe as anticipated with 8 fractures.  Procedures Procedures (including critical care time)  Medications Ordered in ED Medications  0.9 %  sodium chloride infusion ( Intravenous New Bag/Given 05/17/16 1027)     Initial Impression / Assessment and Plan / ED Course  I have reviewed the triage vital signs and the nursing notes.  Pertinent labs & imaging results that were available during my care of the patient were reviewed by me and considered in my medical decision making (see chart for details).  This elderly male with a focal dependency presents after syncope, with fall. Patient actually has history of prior fall, and his evaluation states notable for demonstrating multiple rib fractures as well as hyponatremia.  With concern for syncope, collateral abnormalities, multiple rib fractures, alcohol dependency, the patient was started on a CIWA protocol admitted for further evaluation and management.   Final Clinical Impressions(s) / ED Diagnoses  Hyponatremia Syncope Multiple rib fractures,  closed, initial encounter Skin tear, left elbow, initial encounter   Carmin Muskrat, MD 05/17/16 1254

## 2016-05-17 NOTE — Progress Notes (Signed)
Admission note:  Arrival Method: via stretcher from ED Mental Orientation: A&Ox4 Telemetry: box1, CCMD notified. Assessment: see doc flow sheet Skin: warm and dry. IV: L hand with NS going at 168ml/hr. Pain: 10/10. See MAR for intervention Tubes: none Safety Measures and Fall Prevention Safety Plan: discussed and reviewed with pt, verbalized understanding. Side rails up X2, call light and telephone within reach. Family at bedside. Admission Screening: in progress 6700 Orientation: Patient has been oriented to the unit, staff and to the room.

## 2016-05-17 NOTE — H&P (Signed)
History and Physical    [name] FGH:[id number] DOB: 1945/07/23 DOA: 05/17/2016  PCP: Lucille Passy, MD Patient coming from: home  Chief Complaint: syncope/chest pain  HPI: [name] is a 71 y.o. male with medical history significant for EtOH abuse, EtOH cirrhosis, hypertension, GERD, frequent falls, BPH, memory loss presents to the emergency Department chief complaint of fall. Initial evaluation reveals hyponatremia metabolic acidosis and multiple fx rib and worsening anemia  Information is obtained from the patient keeping in mind information from patient may be unreliable due to a history of memory issues in the setting of ongoing EtOH abuse/cirrhosis. Patient reports being in his usual state of health with the exception of frequent falls. This morning he remembers getting up out of recliner continue go to the bathroom and feeling lightheaded and fell forward. Unclear if he lost conscousness.  Of note he told the emergency room doctor he fell to the left. His brother-in-law found him he was awake alert complaining of pain on his left side. Was constant achy worse with movement and breathing. He denies headache visual disturbances numbness tingling of extremities. He denies chest pain palpitation shortness of breath nausea vomiting diarrhea. He denies dysuria hematuria frequency or urgency. It is noted that he has a recent diagnosis of bullous pemphigoid has been on a steroid taper as well as antibiotics. Family reports patient with ongoing memory issues and drinks at least 1 L of wine a day.  ED Course: In the emergency department he's afebrile hemodynamically stable and not hypoxic.  Review of Systems: As per HPI otherwise all other systems reviewed and are negative.   Ambulatory Status: frequent unsteady gait with frequent falls in the setting of EtOH abuse  Past Medical History:  Diagnosis Date  . Adenomatous polyp of colon 2006  . Allergy    pollen/ragweed  . Anemia 2012  .  Anxiety   . Atrial tachycardia, paroxysmal ([place]) 04/07/2015  . BPH (benign prostatic hypertrophy) 5/99  . Chronic bronchitis ([place])    "q year or q other year" (11/02/2011)  . Colon polyp   . DCM (dilated cardiomyopathy) ([place])    EF 45-50% by echo and cath 2017  . Depression    Hx of  . Falls frequently    "daily lately; legs just give out" (11/02/2011)  . GERD (gastroesophageal reflux disease) 07/31/04   gastritis   . H/O hiatal hernia   . H/O nausea   . Hearing difficulty   . Hernia, umbilical    "unrepaired" (11/02/2011)  . History of blood transfusion 2012  . HLD (hyperlipidemia) 5/99  . HTN (hypertension) 5/99  . Humerus fracture 11/02/2011   LUE  . Memory loss    "recently has been forgetting alot; maybe related to the alcohol" (11/02/2011)  . Mild CAD    10% RCA  . PAC (premature atrial contraction) 04/07/2015  . Peripheral vascular disease ([place])   . Pneumonia 1996   hosp  . Seasonal allergies     Past Surgical History:  Procedure Laterality Date  . CARDIAC CATHETERIZATION N/A 05/23/2015   Procedure: Left Heart Cath and Coronary Angiography;  Surgeon: Troy Sine, MD;  Location: [facility];  Service: Cardiovascular;  Laterality: N/A;  . CATARACT EXTRACTION W/ INTRAOCULAR LENS  IMPLANT, BILATERAL  10/2002  . COLONOSCOPY  07/31/04   multiple polyps; repeat in 3 years  . ESOPHAGEAL VARICE LIGATION  2012  . ETT myoview  03/09/07   nml EF 66%  . HEMORRHOID  SURGERY     "cauterized a long time ago" (11/02/2011)  . hosp CP R/O'D 2/24  03/08/07  . HUMERUS FRACTURE SURGERY Left 11/04/11   with plate to the shoulder  . L duputryen contractive surgery    . neck MRI  6/04   C5-6 disc abnormality  . ORIF HUMERUS FRACTURE  11/04/2011   Procedure: OPEN REDUCTION INTERNAL FIXATION (ORIF) PROXIMAL HUMERUS FRACTURE;  Surgeon: Rozanna Box, MD;  Location: [facility];  Service: Orthopedics;  Laterality: Left;  . POSTERIOR CERVICAL FUSION/FORAMINOTOMY N/A 03/21/2012    Procedure: POSTERIOR CERVICAL FUSION/FORAMINOTOMY LEVEL ONE;  Surgeon: Charlie Pitter, MD;  Location: [facility];  Service: Neurosurgery;  Laterality: N/A;  POSTERIOR CERVICAL ONE TO CERVICAL TWO FUSION WITH ILIAC CREST GRAFT AND LATERAL MASS SCREWS   . TONSILLECTOMY     "I was young" (11/02/2011)  . UPPER GASTROINTESTINAL ENDOSCOPY      Social History   Social History  . Marital status: Married    Spouse name: N/A  . Number of children: 3  . 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 Smoking status: Never Smoker  . Smokeless tobacco: Never Used  . Alcohol use 1.2 oz/week    2 Glasses of wine per week     Comment: 11/02/2011 "large box of wine q 2 days" 01-27-15 2 glass a day of wine  . Drug use: No  . Sexual activity: No   Other Topics Concern  . Not on file   Social History Narrative   Married (remarried), lives with wife; 3 children, 1 at home.    [employer] - mixes colors       Pt revised designated party release form [name], wife [phone] - can leave msg.     [name] 03/04/10.        Would desire CPR.  Would not want prolonged life support if futile.    Allergies  Allergen Reactions  . Nifedipine Swelling    REACTION: SWELLING, 11/02/2011 pt denies this allergy.    Family History  Problem Relation Age of Onset  . Heart disease Father   . Diabetes Father   . Stroke Father   . Depression Mother   . Heart disease Brother   . Alcohol abuse Brother   . Liver disease Brother     Prior to Admission medications   Medication Sig Start Date End Date Taking? Authorizing Provider  B Complex Vitamins (VITAMIN B COMPLEX PO) Take 1 tablet by mouth daily.    Yes [provider]  cetirizine (ZYRTEC) 10 MG tablet Take 10 mg by mouth daily.    Yes [provider]  doxycycline (VIBRA-TABS) 100 MG tablet Take 100 mg by mouth 2 (two) times daily. 02/27/16  Yes  [provider]  lidocaine (LIDODERM) 5 % Place 1 patch onto the skin daily. Remove & Discard patch within 12 hours or as directed by MD Patient taking differently: Place 1 patch onto the skin daily as needed (pain). Remove & Discard patch within 12 hours or as directed by MD 05/11/16  Yes Lucille Passy, MD  Magnesium 500 MG TABS Take 500 mg by mouth daily as needed (energy).    Yes [provider]  mupirocin ointment (BACTROBAN) 2 % Apply 1 application topically daily. Left foot 04/22/16  Yes [provider]  naproxen sodium (ANAPROX) 220 MG tablet Take 440-880 mg  by mouth 2 (two) times daily as needed (PAIN).   Yes [provider]  predniSONE (DELTASONE) 10 MG tablet Take 5 mg by mouth daily with breakfast.    Yes [provider]  promethazine (PHENERGAN) 25 MG tablet TAKE 1 TABLET DAILY AS NEEDED FOR NAUSEA 03/09/16  Yes Lucille Passy, MD  QUEtiapine (SEROQUEL) 300 MG tablet TAKE 1 TABLET AT BEDTIME 03/09/16  Yes Lucille Passy, MD  spironolactone (ALDACTONE) 25 MG tablet Take 1 tablet (25 mg total) by mouth daily. 12/22/15  Yes Lucille Passy, MD  Thiamine HCl (VITAMIN B-1) 100 MG tablet Take 100 mg by mouth daily. Reported on 02/17/2015   Yes [provider]  TOPROL XL 25 MG 24 hr tablet TAKE 1 TABLET DAILY 05/11/16  Yes Sueanne Margarita, MD  traZODone (DESYREL) 50 MG tablet TAKE 1 TABLET AT BEDTIME 03/09/16  Yes Lucille Passy, MD  valACYclovir (VALTREX) 1000 MG tablet Take 1 g by mouth 2 (two) times daily. 02/20/16  Yes [provider]    Physical Exam: Vitals:   05/17/16 1117 05/17/16 1130 05/17/16 1200 05/17/16 1230  BP: 125/67 128/76 119/78 123/78  Pulse: 100 99 96 92  Resp: 18 20 16 20   Temp:      TempSrc:      SpO2: 99% 100% 100% 99%  Weight:      Height:         General:  Appears calm and comfortable, no acute distress Eyes:  PERRL, EOMI, normal lids, iris ENT:  grossly normal hearing, lips & tongue, mucous membranes of his  mouth are moist and pink Neck:  no LAD, masses or thyromegaly Cardiovascular:  RRR, no m/r/g. No LE edema. Left foot with open blister Respiratory:  CTA bilaterally, no w/r/r. Normal respiratory effort. Abdomen:  soft, ntnd, positive bowel sounds throughout no guarding or rebounding Skin:  no rash or induration seen on limited exam Musculoskeletal:  grossly normal tone BUE/BLE, good ROM, no bony abnormality Psychiatric:  grossly normal mood and affect, speech fluent and appropriate, AOx3 Neurologic:  CN 2-12 grossly intact, moves all extremities in coordinated fashion, sensation intact. Attempts to follow commands some delay in responses speech slow but clear  Labs on Admission: I have personally reviewed following labs and imaging studies  CBC:  Recent Labs Lab 05/17/16 0934  WBC 17.3*  NEUTROABS 13.5*  HGB 8.2*  HCT 22.5*  MCV 88.2  PLT 097   Basic Metabolic Panel:  Recent Labs Lab 05/17/16 0934  NA 123*  K 4.8  CL 89*  CO2 22  GLUCOSE 114*  BUN 47*  CREATININE 1.04  CALCIUM 9.2   GFR: Estimated Creatinine Clearance: 67 mL/min (by C-G formula based on SCr of 1.04 mg/dL). Liver Function Tests:  Recent Labs Lab 05/17/16 0934  AST 32  ALT 31  ALKPHOS 56  BILITOT 1.1  PROT 5.4*  ALBUMIN 3.2*    Recent Labs Lab 05/17/16 0934  LIPASE 26   No results for input(s): AMMONIA in the last 168 hours. Coagulation Profile: No results for input(s): INR, PROTIME in the last 168 hours. Cardiac Enzymes:  Recent Labs Lab 05/17/16 0934  TROPONINI <0.03   BNP (last 3 results) No results for input(s): PROBNP in the last 8760 hours. HbA1C: No results for input(s): HGBA1C in the last 72 hours. CBG: No results for input(s): GLUCAP in the last 168 hours. Lipid Profile: No results for input(s): CHOL, HDL, LDLCALC, TRIG, CHOLHDL, LDLDIRECT in the last 72 hours.  Thyroid Function Tests: No results for input(s): TSH, T4TOTAL, FREET4, T3FREE, THYROIDAB in the last 72  hours. Anemia Panel: No results for input(s): VITAMINB12, FOLATE, FERRITIN, TIBC, IRON, RETICCTPCT in the last 72 hours. Urine analysis:    Component Value Date/Time   COLORURINE YELLOW 03/05/2016 [place] 03/05/2016 1509   LABSPEC 1.018 03/05/2016 1509   PHURINE 5.0 03/05/2016 1509   GLUCOSEU NEGATIVE 03/05/2016 1509   HGBUR NEGATIVE 03/05/2016 1509   BILIRUBINUR NEGATIVE 03/05/2016 1509   BILIRUBINUR neg 11/17/2010 1210   KETONESUR NEGATIVE 03/05/2016 1509   PROTEINUR NEGATIVE 03/05/2016 1509   UROBILINOGEN 1.0 11/17/2011 1451   NITRITE NEGATIVE 03/05/2016 1509   LEUKOCYTESUR NEGATIVE 03/05/2016 1509    Creatinine Clearance: Estimated Creatinine Clearance: 67 mL/min (by C-G formula based on SCr of 1.04 mg/dL).  Sepsis Labs: @LABRCNTIP (procalcitonin:4,lacticidven:4) )No results found for this or any previous visit (from the past 240 hour(s)).   Radiological Exams on Admission: Dg Chest 2 View  Result Date: 05/17/2016 CLINICAL DATA:  Syncopal episode.  Fall with pain in the left ribs. EXAM: CHEST  2 VIEW COMPARISON:  CT of the chest 03/05/2016 and two-view chest x-ray 05/16/2015. FINDINGS: The heart is enlarged. There is no edema or effusion to suggest failure. Lung volumes are low. Posterior left second, third, and fourth rib fractures are suspected. Asymmetric pleural thickening or fluid is noted at the left apex. IMPRESSION: 1. Suspected left posterior second, third, and fourth rib fractures. Recommend CT of the chest without contrast for further evaluation. 2. Asymmetric left apical pleural thickening or fluid likely related to the acute fractures. Electronically Signed   By: San Morelle M.D.   On: 05/17/2016 10:15   Ct Head Wo Contrast  Result Date: 05/17/2016 CLINICAL DATA:  Status post fall. Unknown if the loss consciousness. Syncope. EXAM: CT HEAD WITHOUT CONTRAST CT CERVICAL SPINE WITHOUT CONTRAST TECHNIQUE: Multidetector CT imaging of the head and  cervical spine was performed following the standard protocol without intravenous contrast. Multiplanar CT image reconstructions of the cervical spine were also generated. COMPARISON:  03/05/2016 FINDINGS: CT HEAD FINDINGS Brain: No evidence of acute infarction, hemorrhage, extra-axial collection, ventriculomegaly, or mass effect. Generalized cerebral atrophy. Periventricular white matter low attenuation likely secondary to microangiopathy. Vascular: Cerebrovascular atherosclerotic calcifications are noted. Skull: Negative for fracture or focal lesion. Sinuses/Orbits: Visualized portions of the orbits are unremarkable. Visualized portions of the paranasal sinuses and mastoid air cells are unremarkable. Other: None. CT CERVICAL SPINE FINDINGS Alignment: Normal. Skull base and vertebrae: No acute fracture. No primary bone lesion or focal pathologic process. Ununited type 1 fracture of the odontoid process with posterior fusion of C2-3 with bilateral pedicle screws and a cerclage wire. Soft tissues and spinal canal: No prevertebral fluid or swelling. No visible canal hematoma. Disc levels: Degenerative disc disease with mild disc height loss at C5-6. Posterior spinal fusion at C2-3 with moderate left facet arthropathy and left foraminal stenosis. No hardware failure or complication. Moderate bilateral facet arthropathy at C3-4 and C4-5. Bilateral foraminal narrowing at C4-5. Broad-based disc osteophyte complex at C5-6 with bilateral uncovertebral degenerative changes and moderate bilateral foraminal stenosis. Other: No fluid collection or hematoma. Bilateral carotid artery atherosclerosis. IMPRESSION: 1. No acute intracranial pathology. 2. No acute osseous injury the cervical spine. 3. Cervical spine spondylosis as described above. 4. Chronic ununited type 1 fracture of the odontoid process with posterior fusion at C2-3 without failure or complication. Electronically Signed   By: Kathreen Devoid   On: 05/17/2016 11:18  Ct Chest Wo Contrast  Result Date: 05/17/2016 CLINICAL DATA:  Fall, syncope EXAM: CT CHEST WITHOUT CONTRAST TECHNIQUE: Multidetector CT imaging of the chest was performed following the standard protocol without IV contrast. COMPARISON:  Chest CT 03/05/2016.  Chest x-ray earlier today. FINDINGS: Cardiovascular: Heart is borderline in size. Extensive coronary artery and aortic calcifications. No evidence of aortic aneurysm. Mediastinum/Nodes: No mediastinal, hilar, or axillary adenopathy. Trachea and esophagus are unremarkable. Lungs/Pleura: No confluent airspace opacities or pleural effusions. No pneumothorax. Upper Abdomen: Layering gallstones within the gallbladder. No acute findings. Musculoskeletal: Bilateral gynecomastia. Multiple left rib fractures are noted involving the left second through eighth ribs. The second and third rib fractures are comminuted. There is no associated pneumothorax. Overlying pleural or extrapleural soft tissue thickening noted in the lateral left apex. Multiple old healed posterior right rib fractures. IMPRESSION: Left second through eighth rib fractures posterolaterally. No associated left effusion or pneumothorax. Cardiomegaly. Coronary artery disease, aortic atherosclerosis. Bilateral gynecomastia. Electronically Signed   By: Rolm Baptise M.D.   On: 05/17/2016 11:11   Ct Cervical Spine Wo Contrast  Result Date: 05/17/2016 CLINICAL DATA:  Status post fall. Unknown if the loss consciousness. Syncope. EXAM: CT HEAD WITHOUT CONTRAST CT CERVICAL SPINE WITHOUT CONTRAST TECHNIQUE: Multidetector CT imaging of the head and cervical spine was performed following the standard protocol without intravenous contrast. Multiplanar CT image reconstructions of the cervical spine were also generated. COMPARISON:  03/05/2016 FINDINGS: CT HEAD FINDINGS Brain: No evidence of acute infarction, hemorrhage, extra-axial collection, ventriculomegaly, or mass effect. Generalized cerebral atrophy.  Periventricular white matter low attenuation likely secondary to microangiopathy. Vascular: Cerebrovascular atherosclerotic calcifications are noted. Skull: Negative for fracture or focal lesion. Sinuses/Orbits: Visualized portions of the orbits are unremarkable. Visualized portions of the paranasal sinuses and mastoid air cells are unremarkable. Other: None. CT CERVICAL SPINE FINDINGS Alignment: Normal. Skull base and vertebrae: No acute fracture. No primary bone lesion or focal pathologic process. Ununited type 1 fracture of the odontoid process with posterior fusion of C2-3 with bilateral pedicle screws and a cerclage wire. Soft tissues and spinal canal: No prevertebral fluid or swelling. No visible canal hematoma. Disc levels: Degenerative disc disease with mild disc height loss at C5-6. Posterior spinal fusion at C2-3 with moderate left facet arthropathy and left foraminal stenosis. No hardware failure or complication. Moderate bilateral facet arthropathy at C3-4 and C4-5. Bilateral foraminal narrowing at C4-5. Broad-based disc osteophyte complex at C5-6 with bilateral uncovertebral degenerative changes and moderate bilateral foraminal stenosis. Other: No fluid collection or hematoma. Bilateral carotid artery atherosclerosis. IMPRESSION: 1. No acute intracranial pathology. 2. No acute osseous injury the cervical spine. 3. Cervical spine spondylosis as described above. 4. Chronic ununited type 1 fracture of the odontoid process with posterior fusion at C2-3 without failure or complication. Electronically Signed   By: Kathreen Devoid   On: 05/17/2016 11:18    EKG: Independently reviewed. Sinus rhythm Atrial fibrillation T wave abnormality Abnormal ekg   Assessment/Plan Principal Problem:   Syncope Active Problems:   HLD (hyperlipidemia)   Anxiety state   Alcohol abuse   Essential hypertension   ALCOHOLIC CIRRHOSIS OF LIVER   BPH (benign prostatic hyperplasia)   Hyponatremia   DCM (dilated  cardiomyopathy) ([facility])   Chronic pain syndrome   Cellulitis   Rib fractures   Bullous pemphigoid   #1. Syncope. Most likely related to hyponatremia in setting of EtOH abuse. Hx of dizziness presumable related to orthostasis per chart review. Chart indicates spironolactone dose decreased 3 months ago.  CT of the head unremarkable. EKG as noted above. Troponin negativ. Patient is afebrile hemodynamically stable nontoxic appearing. He does have a leukocytosis but has been on a steroid taper for the last 4 days.. Chest x-ray left rib 2 through 8 fracture no pneumothorax. -Admit for syncope workup -Follow urinalysis -Monitor on telemetry -Echocardiogram -Cycle troponin -Gentle IV fluids  #2. Hyponatremia. Patient's baseline is low and of normal but current level beneath that. Likely related to EtOH abuse. -Urine sodium -Urine osmolality -Serum osmolality -Gentle IV fluids -Recheck this afternoon  #3. Multiple rib fractures on left. Chest x-ray and CT as noted above. -Pain management -Incentive spirometry -Mobilize -Monitor oxygen saturation level -Oxygen supplementation as indicated  #4. EtOH abuse. Family reports patient drinks 1 L of wine per day. Chart review indicates PCP encouraged 3 have multiple occasions. Patient declines. No signs withdrawal. -CIWA protocol -Monitor  5. Anemia. Likely related to EtOH abuse. History of same. Current hemoglobin 8.2 which appears to be lower than his baseline. No signs symptoms of active bleeding -FOBT -Anemia panel -Monitor  6. Bullous pemphigoid. Left Foot. Patient's been on antibiotics a steroid taper for 4 days -Continue steroid taper -Continue antibiotics -Wound consult  #7. Hypertension. Fair control in the emergency department. Home medications include metoprolol, spironolactone -Continue home meds -Monitor  #8. EtOH cirrhosis. Appears stable at baseline. Function tests within the limits of normal. EtOH level less than  5. -Continue home meds  #9. Chronic pain. Home meds include Lidoderm, Anaprox oxycodone IR. Anticipate difficult pain management with multiple rib fx.  -continue home meds -IV dilaudid for breakthrough -mobilize    DVT prophylaxis: lovenox  Code Status: full  Family Communication: none present  Disposition Plan: home  Consults called: none  Admission status: obs    Dyanne Carrel M MD [facility]  If 7PM-7AM, please contact night-coverage [email or web address] Password [email or web address]  05/17/2016, 12:55 PM

## 2016-05-17 NOTE — Progress Notes (Signed)
[facility] notified that patient is having nausea not relieved from zofran 4mg  IV. Arthor Captain LPN

## 2016-05-17 NOTE — ED Notes (Signed)
Attempted report 

## 2016-05-18 ENCOUNTER — Other Ambulatory Visit (HOSPITAL_COMMUNITY): Payer: Medicare Other

## 2016-05-18 ENCOUNTER — Encounter (HOSPITAL_COMMUNITY): Payer: Self-pay | Admitting: Physician Assistant

## 2016-05-18 ENCOUNTER — Observation Stay (HOSPITAL_COMMUNITY): Payer: Medicare Other

## 2016-05-18 DIAGNOSIS — Z5189 Encounter for other specified aftercare: Secondary | ICD-10-CM | POA: Diagnosis not present

## 2016-05-18 DIAGNOSIS — Z833 Family history of diabetes mellitus: Secondary | ICD-10-CM | POA: Diagnosis not present

## 2016-05-18 DIAGNOSIS — Y92009 Unspecified place in unspecified non-institutional (private) residence as the place of occurrence of the external cause: Secondary | ICD-10-CM | POA: Diagnosis not present

## 2016-05-18 DIAGNOSIS — D638 Anemia in other chronic diseases classified elsewhere: Secondary | ICD-10-CM | POA: Diagnosis not present

## 2016-05-18 DIAGNOSIS — N400 Enlarged prostate without lower urinary tract symptoms: Secondary | ICD-10-CM | POA: Diagnosis present

## 2016-05-18 DIAGNOSIS — R2681 Unsteadiness on feet: Secondary | ICD-10-CM | POA: Diagnosis not present

## 2016-05-18 DIAGNOSIS — S2242XA Multiple fractures of ribs, left side, initial encounter for closed fracture: Secondary | ICD-10-CM | POA: Diagnosis not present

## 2016-05-18 DIAGNOSIS — D696 Thrombocytopenia, unspecified: Secondary | ICD-10-CM | POA: Diagnosis present

## 2016-05-18 DIAGNOSIS — L120 Bullous pemphigoid: Secondary | ICD-10-CM | POA: Diagnosis not present

## 2016-05-18 DIAGNOSIS — Z8249 Family history of ischemic heart disease and other diseases of the circulatory system: Secondary | ICD-10-CM | POA: Diagnosis not present

## 2016-05-18 DIAGNOSIS — E871 Hypo-osmolality and hyponatremia: Secondary | ICD-10-CM | POA: Diagnosis not present

## 2016-05-18 DIAGNOSIS — G894 Chronic pain syndrome: Secondary | ICD-10-CM

## 2016-05-18 DIAGNOSIS — L0390 Cellulitis, unspecified: Secondary | ICD-10-CM | POA: Diagnosis not present

## 2016-05-18 DIAGNOSIS — I10 Essential (primary) hypertension: Secondary | ICD-10-CM | POA: Diagnosis not present

## 2016-05-18 DIAGNOSIS — F411 Generalized anxiety disorder: Secondary | ICD-10-CM

## 2016-05-18 DIAGNOSIS — R55 Syncope and collapse: Secondary | ICD-10-CM

## 2016-05-18 DIAGNOSIS — Z9181 History of falling: Secondary | ICD-10-CM | POA: Diagnosis not present

## 2016-05-18 DIAGNOSIS — K5730 Diverticulosis of large intestine without perforation or abscess without bleeding: Secondary | ICD-10-CM | POA: Diagnosis not present

## 2016-05-18 DIAGNOSIS — K7030 Alcoholic cirrhosis of liver without ascites: Secondary | ICD-10-CM | POA: Diagnosis not present

## 2016-05-18 DIAGNOSIS — F1020 Alcohol dependence, uncomplicated: Secondary | ICD-10-CM | POA: Diagnosis present

## 2016-05-18 DIAGNOSIS — W1830XA Fall on same level, unspecified, initial encounter: Secondary | ICD-10-CM | POA: Diagnosis present

## 2016-05-18 DIAGNOSIS — F1010 Alcohol abuse, uncomplicated: Secondary | ICD-10-CM | POA: Diagnosis not present

## 2016-05-18 DIAGNOSIS — E872 Acidosis: Secondary | ICD-10-CM | POA: Diagnosis present

## 2016-05-18 DIAGNOSIS — E861 Hypovolemia: Secondary | ICD-10-CM | POA: Diagnosis present

## 2016-05-18 DIAGNOSIS — S2242XS Multiple fractures of ribs, left side, sequela: Secondary | ICD-10-CM | POA: Diagnosis not present

## 2016-05-18 DIAGNOSIS — S2232XA Fracture of one rib, left side, initial encounter for closed fracture: Secondary | ICD-10-CM | POA: Diagnosis not present

## 2016-05-18 DIAGNOSIS — K760 Fatty (change of) liver, not elsewhere classified: Secondary | ICD-10-CM | POA: Diagnosis not present

## 2016-05-18 DIAGNOSIS — Z818 Family history of other mental and behavioral disorders: Secondary | ICD-10-CM | POA: Diagnosis not present

## 2016-05-18 DIAGNOSIS — S2242XD Multiple fractures of ribs, left side, subsequent encounter for fracture with routine healing: Secondary | ICD-10-CM | POA: Diagnosis not present

## 2016-05-18 DIAGNOSIS — Z87891 Personal history of nicotine dependence: Secondary | ICD-10-CM | POA: Diagnosis not present

## 2016-05-18 DIAGNOSIS — I420 Dilated cardiomyopathy: Secondary | ICD-10-CM | POA: Diagnosis present

## 2016-05-18 DIAGNOSIS — D649 Anemia, unspecified: Secondary | ICD-10-CM | POA: Diagnosis present

## 2016-05-18 DIAGNOSIS — I739 Peripheral vascular disease, unspecified: Secondary | ICD-10-CM | POA: Diagnosis present

## 2016-05-18 DIAGNOSIS — K219 Gastro-esophageal reflux disease without esophagitis: Secondary | ICD-10-CM | POA: Diagnosis present

## 2016-05-18 DIAGNOSIS — M6281 Muscle weakness (generalized): Secondary | ICD-10-CM | POA: Diagnosis not present

## 2016-05-18 DIAGNOSIS — E785 Hyperlipidemia, unspecified: Secondary | ICD-10-CM | POA: Diagnosis present

## 2016-05-18 DIAGNOSIS — R296 Repeated falls: Secondary | ICD-10-CM | POA: Diagnosis present

## 2016-05-18 DIAGNOSIS — Z823 Family history of stroke: Secondary | ICD-10-CM | POA: Diagnosis not present

## 2016-05-18 DIAGNOSIS — K5900 Constipation, unspecified: Secondary | ICD-10-CM | POA: Diagnosis present

## 2016-05-18 DIAGNOSIS — R5381 Other malaise: Secondary | ICD-10-CM | POA: Diagnosis not present

## 2016-05-18 DIAGNOSIS — Z811 Family history of alcohol abuse and dependence: Secondary | ICD-10-CM | POA: Diagnosis not present

## 2016-05-18 LAB — CBC
HEMATOCRIT: 16.6 % — AB (ref 39.0–52.0)
Hemoglobin: 6 g/dL — CL (ref 13.0–17.0)
MCH: 32.1 pg (ref 26.0–34.0)
MCHC: 36.1 g/dL — AB (ref 30.0–36.0)
MCV: 88.8 fL (ref 78.0–100.0)
PLATELETS: 134 10*3/uL — AB (ref 150–400)
RBC: 1.87 MIL/uL — ABNORMAL LOW (ref 4.22–5.81)
RDW: 14.4 % (ref 11.5–15.5)
WBC: 7.6 10*3/uL (ref 4.0–10.5)

## 2016-05-18 LAB — COMPREHENSIVE METABOLIC PANEL
ALK PHOS: 40 U/L (ref 38–126)
ALT: 24 U/L (ref 17–63)
AST: 34 U/L (ref 15–41)
Albumin: 2.4 g/dL — ABNORMAL LOW (ref 3.5–5.0)
Anion gap: 9 (ref 5–15)
BUN: 39 mg/dL — AB (ref 6–20)
CALCIUM: 7.9 mg/dL — AB (ref 8.9–10.3)
CHLORIDE: 93 mmol/L — AB (ref 101–111)
CO2: 22 mmol/L (ref 22–32)
Creatinine, Ser: 0.87 mg/dL (ref 0.61–1.24)
GFR calc non Af Amer: >60 mL/min (ref >60)
Glucose, Bld: 99 mg/dL (ref 65–99)
Potassium: 5.1 mmol/L (ref 3.5–5.1)
SODIUM: 124 mmol/L — AB (ref 135–145)
Total Bilirubin: 1.5 mg/dL — ABNORMAL HIGH (ref 0.3–1.2)
Total Protein: 3.9 g/dL — ABNORMAL LOW (ref 6.5–8.1)

## 2016-05-18 LAB — PREPARE RBC (CROSSMATCH)

## 2016-05-18 LAB — HEMOGLOBIN AND HEMATOCRIT, BLOOD
HCT: 22.9 % — ABNORMAL LOW (ref 39.0–52.0)
HEMOGLOBIN: 8 g/dL — AB (ref 13.0–17.0)

## 2016-05-18 LAB — MRSA PCR SCREENING: MRSA by PCR: POSITIVE — AB

## 2016-05-18 LAB — MAGNESIUM: MAGNESIUM: 1.7 mg/dL (ref 1.7–2.4)

## 2016-05-18 LAB — GLUCOSE, CAPILLARY: Glucose-Capillary: 114 mg/dL — ABNORMAL HIGH (ref 65–99)

## 2016-05-18 MED ORDER — TRAMADOL HCL 50 MG PO TABS
100.0000 mg | ORAL_TABLET | Freq: Three times a day (TID) | ORAL | Status: DC
  Administered 2016-05-18 – 2016-05-19 (×4): 100 mg via ORAL
  Filled 2016-05-18 (×4): qty 2

## 2016-05-18 MED ORDER — MAGNESIUM SULFATE 4 GM/100ML IV SOLN
4.0000 g | Freq: Once | INTRAVENOUS | Status: AC
  Administered 2016-05-18: 4 g via INTRAVENOUS
  Filled 2016-05-18: qty 100

## 2016-05-18 MED ORDER — ACETAMINOPHEN 325 MG PO TABS
650.0000 mg | ORAL_TABLET | Freq: Once | ORAL | Status: AC
  Administered 2016-05-18: 650 mg via ORAL
  Filled 2016-05-18: qty 2

## 2016-05-18 MED ORDER — PROCHLORPERAZINE EDISYLATE 5 MG/ML IJ SOLN
10.0000 mg | Freq: Four times a day (QID) | INTRAMUSCULAR | Status: DC | PRN
  Administered 2016-05-18 (×2): 10 mg via INTRAVENOUS
  Filled 2016-05-18 (×2): qty 2

## 2016-05-18 MED ORDER — MUPIROCIN 2 % EX OINT
1.0000 "application " | TOPICAL_OINTMENT | Freq: Two times a day (BID) | CUTANEOUS | Status: DC
  Administered 2016-05-18 – 2016-05-22 (×9): 1 via NASAL
  Filled 2016-05-18: qty 22

## 2016-05-18 MED ORDER — DIPHENHYDRAMINE HCL 25 MG PO CAPS
25.0000 mg | ORAL_CAPSULE | Freq: Once | ORAL | Status: AC
  Administered 2016-05-18: 25 mg via ORAL
  Filled 2016-05-18: qty 1

## 2016-05-18 MED ORDER — PANTOPRAZOLE SODIUM 40 MG PO TBEC
40.0000 mg | DELAYED_RELEASE_TABLET | Freq: Every day | ORAL | Status: DC
  Administered 2016-05-19 – 2016-05-22 (×4): 40 mg via ORAL
  Filled 2016-05-18 (×4): qty 1

## 2016-05-18 MED ORDER — SODIUM CHLORIDE 0.9 % IV SOLN: Freq: Once | INTRAVENOUS | Status: DC

## 2016-05-18 MED ORDER — FUROSEMIDE 10 MG/ML IJ SOLN
20.0000 mg | Freq: Once | INTRAMUSCULAR | Status: AC
  Administered 2016-05-18: 20 mg via INTRAVENOUS
  Filled 2016-05-18: qty 2

## 2016-05-18 MED ORDER — CHLORHEXIDINE GLUCONATE CLOTH 2 % EX PADS
6.0000 | MEDICATED_PAD | Freq: Every day | CUTANEOUS | Status: DC
  Administered 2016-05-18 – 2016-05-22 (×4): 6 via TOPICAL

## 2016-05-18 MED ORDER — IOPAMIDOL (ISOVUE-300) INJECTION 61%
INTRAVENOUS | Status: AC
  Administered 2016-05-18: 100 mL
  Filled 2016-05-18: qty 100

## 2016-05-18 MED ORDER — IOPAMIDOL (ISOVUE-300) INJECTION 61%
15.0000 mL | INTRAVENOUS | Status: AC
  Administered 2016-05-18 (×2): 15 mL via ORAL

## 2016-05-18 NOTE — Progress Notes (Signed)
Initial Nutrition Assessment  DOCUMENTATION CODES:   Not applicable  INTERVENTION:   -Continue Ensure Enlive po BID, each supplement provides 350 kcal and 20 grams of protein -Recommend addition of MVI  NUTRITION DIAGNOSIS:   Inadequate oral intake related to social / environmental circumstances as evidenced by per patient/family report.  GOAL:   Patient will meet greater than or equal to 90% of their needs  MONITOR:   PO intake, Supplement acceptance, Labs, Weight trends  REASON FOR ASSESSMENT:   Malnutrition Screening Tool    ASSESSMENT:    71 yo male admitted with syncope, hyponatremia, multiple rib fractures. Pt with hx of EtOH abuse, cirrhosis HTN, GERD, frequent falls, CM EF 45-50%  CT abdomen pending; Pt on CIWA protocol  Recorded po intake 25% at breakfast this AM. Pt ate bites of chicken and rice soup, an orange sherbet, part of a sandwich at lunch today. Pt reports typical meal is roasted meat with corn and broccoli or a variation of this.  Per chart, pt drinks 1 L of wine per day (reported by family)  Pt reports appetite has been good, eating 1-2 meals per day.   Weight fluctuates. Pt reports UBW around 190-200; pt reports recent wt of 170 pounds. Current weight 180 pounds . Per wt encounters, pt with 2.7% wt loss in 3 months; 5.6% wt loss in 7 months.   Wt Readings from Last 10 Encounters:  05/17/16 180 lb 12.4 oz (82 kg)  03/17/16 173 lb (78.5 kg)  03/05/16 185 lb (83.9 kg)  02/20/16 185 lb 12.8 oz (84.3 kg)  01/21/16 187 lb (84.8 kg)  12/22/15 184 lb 8 oz (83.7 kg)  12/09/15 184 lb 5 oz (83.6 kg)  11/20/15 186 lb 12 oz (84.7 kg)  11/05/15 189 lb (85.7 kg)  11/04/15 191 lb 12.8 oz (87 kg)   Nutrition-Focused physical exam completed. Findings are no fat depletion, mild muscle depletion in LE, and moderate edema.   Pt is at risk for malnutrition but does not meet clinical characteristics based on current information available.   Labs: sodium 124,  potassium 5.1 Meds: NS at 100 ml/hr, B-complex with Vit C, thiamine, prednisone  Diet Order:  Diet Heart Room service appropriate? Yes; Fluid consistency: Thin  Skin:  Reviewed, no issues  Last BM:  05/16/16  Height:   Ht Readings from Last 1 Encounters:  05/17/16 5\' 9"  (1.753 m)    Weight:   Wt Readings from Last 1 Encounters:  05/17/16 180 lb 12.4 oz (82 kg)    Ideal Body Weight:     BMI:  Body mass index is 26.7 kg/m.  Estimated Nutritional Needs:   Kcal:  2000-2250 kcals  Protein:  100-115 g  Fluid:  >/= 2 L  EDUCATION NEEDS:   No education needs identified at this time  [place], [place], LDN [phone] Pager  [phone] Weekend/On-Call Pager

## 2016-05-18 NOTE — Consult Note (Signed)
[facility] Nurse wound consult note Reason for Consult: blister left foot Patient has a known diagnosis:. Bullous pemphigoid. Followed by Dr. [name] Crane dermatology in [place]. Wound type: ruptured blister with partial thickness skin loss left dorsal foot; skin tear/avulsion left upper lateral arm Pressure Injury POA: No Measurement: Left foot: 6cm x 5cm x 0.1cm; 100% yellow with some budding Left arm: 10cm x 5.5cm x 0.1cm; 50% intact skin flap, pink and moist Wound bed: see above Drainage (amount, consistency, odor) minimal on the foot wound, some sanguinous oozing noted with dressing change Periwound: intact with evidence of chronic skin condition and healing Dressing procedure/placement/frequency: Xeroform gauze single layer over the dorsal foot and left arm wound, change every other day.  Discussed POC with patient and bedside nurse.  Re consult if needed, will not follow at this time. Thanks  Ketara Cavness [employer], RN,CWOCN, CNS [phone])

## 2016-05-18 NOTE — Progress Notes (Signed)
PT Cancellation Note  Patient Details Name: KHYLAN SAWYER MRN: [id number] DOB: 1945/02/21   Cancelled Treatment:    Reason Eval/Treat Not Completed: Other (comment)   Discussed pt with RN, who reports pt's Hgb is 6, and the plan is for him to receive 2 units of blood today;   Will return for PT tomorrow;   Roney Marion, [place] Pager [phone] Office [place] 05/18/2016, 10:45 AM

## 2016-05-18 NOTE — Care Management Obs Status (Signed)
[place] NOTIFICATION   Patient Details  Name: [name] MRN: [id number] Date of Birth: 1945/04/28   Medicare Observation Status Notification Given:  Yes    Modupe Shampine, Rory Percy, RN 05/18/2016, 11:38 AM

## 2016-05-18 NOTE — Progress Notes (Signed)
PT Cancellation Note  Patient Details Name: [name] MRN: [id number] DOB: 11/08/1945   Cancelled Treatment:    Reason Eval/Treat Not Completed: Other (comment)   Politely declining PT at this time due to pain;   Requested pain meds;   Will follow up later today as time allows;  Otherwise, will follow up for PT tomorrow;   Thank you,  Roney Marion, PT  Acute Rehabilitation Services Pager [phone] Office [place] 05/18/2016, 9:43 AM

## 2016-05-18 NOTE — Progress Notes (Signed)
PROGRESS NOTE    [name]  WVP:[id number] DOB: [birth year] DOA: 05/17/2016 PCP: Lucille Passy, MD    Brief Narrative:  Patient is a 71 year old gentleman history of alcohol use, memory loss, BPH, frequent falls with previous rib fractures, now presenting with falls, concern for possible syncopal episode with significant hyponatremia. Patient with no overt bleeding have a hemoglobin drop to 6.0 from 8.2 on admission. Patient being chest views 2 units packed red blood cells. GI consulted.   Assessment & Plan:   Principal Problem:   Syncope Active Problems:   Symptomatic anemia   HLD (hyperlipidemia)   Anxiety state   Alcohol abuse   Essential hypertension   ALCOHOLIC CIRRHOSIS OF LIVER   BPH (benign prostatic hyperplasia)   Hyponatremia   DCM (dilated cardiomyopathy) (HCC)   Chronic pain syndrome   Cellulitis   Rib fractures   Bullous pemphigoid   Syncope and collapse  #1 symptomatic anemia Patient presenting with falls with dizziness preceding the fall and concerns for possible syncopal episode. Patient with no overt bleeding. Hemoglobin currently at 6.0 from 8.2 on admission. Hemoglobin was 10.7 03/05/2016. Check FOBT. Transfuse 2 units packed red blood cells. Check a CT abdomen and pelvis to rule out retroperitoneal bleed. GI consulted and feel as patient does not have any overt bleeding unlikely to be GI bleeding. Recommending CT abdomen and pelvis to rule out retroperitoneal bleed. If CT abdomen and pelvis is negative for any retroperitoneal bleed may need a hematology consult for further evaluation. Follow H&H. GI following and appreciate input and recommendations.  #2 syncope versus presyncope Consent about syncopal episode prior to admission leading to patient's fall. Patient does complain of lightheadedness. Patient also noted to be hyponatremic in the setting of alcohol abuse on admission. Patient also noted to be on spironolactone with dose decreased 3 months ago. CT  head negative for any acute abnormalities. Monitor on telemetry. 2-D echo pending. Troponin I negative. Urinalysis nitrite negative leukocytes negative. IV fluids. Supportive care. Follow.  #3 alcohol abuse Alcohol cessation. Continue the Ativan withdrawal protocol.  #4 hyponatremia Likely secondary to hypovolemia in the setting of diuretics and alcohol use. Urine sodium of 16. Urine osmolality of 620. TSH within normal limits at 1.89. Will check a cortisol level. Increase IV fluids to 100 mL per hour. Follow.  #5 multiple left-sided rib fractures Chest x-ray and CT chest. Incentive spirometry. Pain management.  #6 bullous pemphigus left foot Patient was on antibiotics and steroids for the past 4 days prior to admission. Continue steroid taper, antibiotics, wound care.  #7 hypertension Blood pressure borderline. Continue to hold antihypertensive medications.  #8??? Alcohol cirrhosis Consent for alcohol cirrhosis however no significant mention of alcoholic cirrhosis in the chart. CT abdomen and pelvis. Alcohol level was less than 5.  #9 chronic pain Continue home regimen of Lidoderm, oxycodone IR. Pain management. Mobilize.   DVT prophylaxis: SCDs Code Status: Full Family Communication: Updated patient and wife at bedside. Disposition Plan: Home when medically stable, anemia improved, pain management of rib fractures, improvement in hyponatremia.   Consultants:   Gastroenterology: Pending  Procedures:   CT chest 05/17/2016  CT head and CT C-spine 05/17/2016  Chest x-ray 05/17/2016  2 units packed red blood cells 05/18/2016  Antimicrobials:   None   Subjective: Patient complaining of left-sided rib pain. No nausea. No emesis. No shortness of breath. No chest pain. No abdominal pain. Patient denies any bloody stools or bloody bowel movements.  Objective: Vitals:   05/18/16  1136 05/18/16 1358 05/18/16 1415 05/18/16 1446  BP: 127/72 105/72 105/72 119/72  Pulse: 96  84 84 81  Resp: 16 16 16 16   Temp: 98.2 F (36.8 C) 98.2 F (36.8 C) 98.2 F (36.8 C) 98 F (36.7 C)  TempSrc: Oral Oral Oral Oral  SpO2: 96% 100% 100% 98%  Weight:      Height:        Intake/Output Summary (Last 24 hours) at 05/18/16 1536 Last data filed at 05/18/16 1437  Gross per 24 hour  Intake          2600.42 ml  Output             1350 ml  Net          1250.42 ml   Filed Weights   05/17/16 0926 05/17/16 2001  Weight: 83.9 kg (185 lb) 82 kg (180 lb 12.4 oz)    Examination:  General exam: Appears calm and comfortable  Respiratory system: Clear to auscultation. Respiratory effort normal. Cardiovascular system: S1 & S2 heard, RRR. No JVD, murmurs, rubs, gallops or clicks. 1-2+ bilateral lower extremity edema. Gastrointestinal system: Abdomen is nondistended, soft and nontender. No organomegaly or masses felt. Normal bowel sounds heard. Central nervous system: Alert and oriented. No focal neurological deficits. Extremities: Symmetric 5 x 5 power. Left foot wrapped. Skin: No rashes, lesions or ulcers Psychiatry: Judgement and insight appear poor to fair. Mood & affect appropriate.     Data Reviewed: I have personally reviewed following labs and imaging studies  CBC:  Recent Labs Lab 05/17/16 0934 05/18/16 0725  WBC 17.3* 7.6  NEUTROABS 13.5*  --   HGB 8.2* 6.0*  HCT 22.5* 16.6*  MCV 88.2 88.8  PLT 217 024*   Basic Metabolic Panel:  Recent Labs Lab 05/17/16 0934 05/17/16 1449 05/18/16 0451 05/18/16 0950  NA 123* 123* 124*  --   K 4.8 4.3 5.1  --   CL 89* 90* 93*  --   CO2 22 25 22   --   GLUCOSE 114* 116* 99  --   BUN 47* 43* 39*  --   CREATININE 1.04 0.98 0.87  --   CALCIUM 9.2 8.6* 7.9*  --   MG  --   --   --  1.7   GFR: Estimated Creatinine Clearance: 80.1 mL/min (by C-G formula based on SCr of 0.87 mg/dL). Liver Function Tests:  Recent Labs Lab 05/17/16 0934 05/18/16 0451  AST 32 34  ALT 31 24  ALKPHOS 56 40  BILITOT 1.1 1.5*  PROT  5.4* 3.9*  ALBUMIN 3.2* 2.4*    Recent Labs Lab 05/17/16 0934  LIPASE 26   No results for input(s): AMMONIA in the last 168 hours. Coagulation Profile: No results for input(s): INR, PROTIME in the last 168 hours. Cardiac Enzymes:  Recent Labs Lab 05/17/16 0934  TROPONINI <0.03   BNP (last 3 results) No results for input(s): PROBNP in the last 8760 hours. HbA1C: No results for input(s): HGBA1C in the last 72 hours. CBG:  Recent Labs Lab 05/18/16 0755  GLUCAP 114*   Lipid Profile: No results for input(s): CHOL, HDL, LDLCALC, TRIG, CHOLHDL, LDLDIRECT in the last 72 hours. Thyroid Function Tests: No results for input(s): TSH, T4TOTAL, FREET4, T3FREE, THYROIDAB in the last 72 hours. Anemia Panel:  Recent Labs  05/17/16 1449  VITAMINB12 6,041*  FOLATE 15.1  FERRITIN 421*  TIBC 154*  IRON 142  RETICCTPCT 2.7   Sepsis Labs: No results for input(s): PROCALCITON, LATICACIDVEN  in the last 168 hours.  Recent Results (from the past 240 hour(s))  MRSA PCR Screening     Status: Abnormal   Collection Time: 05/17/16 10:42 PM  Result Value Ref Range Status   MRSA by PCR POSITIVE (A) NEGATIVE Final    Comment:        The GeneXpert MRSA Assay (FDA approved for NASAL specimens only), is one component of a comprehensive MRSA colonization surveillance program. It is not intended to diagnose MRSA infection nor to guide or monitor treatment for MRSA infections. RESULT CALLED TO, READ BACK BY AND VERIFIED WITH: CALLED TO V.BROWN AT 0317 BY L.PITT 05/18/16          Radiology Studies: Dg Chest 2 View  Result Date: 05/17/2016 CLINICAL DATA:  Syncopal episode.  Fall with pain in the left ribs. EXAM: CHEST  2 VIEW COMPARISON:  CT of the chest 03/05/2016 and two-view chest x-ray 05/16/2015. FINDINGS: The heart is enlarged. There is no edema or effusion to suggest failure. Lung volumes are low. Posterior left second, third, and fourth rib fractures are suspected. Asymmetric  pleural thickening or fluid is noted at the left apex. IMPRESSION: 1. Suspected left posterior second, third, and fourth rib fractures. Recommend CT of the chest without contrast for further evaluation. 2. Asymmetric left apical pleural thickening or fluid likely related to the acute fractures. Electronically Signed   By: San Morelle M.D.   On: 05/17/2016 10:15   Ct Head Wo Contrast  Result Date: 05/17/2016 CLINICAL DATA:  Status post fall. Unknown if the loss consciousness. Syncope. EXAM: CT HEAD WITHOUT CONTRAST CT CERVICAL SPINE WITHOUT CONTRAST TECHNIQUE: Multidetector CT imaging of the head and cervical spine was performed following the standard protocol without intravenous contrast. Multiplanar CT image reconstructions of the cervical spine were also generated. COMPARISON:  03/05/2016 FINDINGS: CT HEAD FINDINGS Brain: No evidence of acute infarction, hemorrhage, extra-axial collection, ventriculomegaly, or mass effect. Generalized cerebral atrophy. Periventricular white matter low attenuation likely secondary to microangiopathy. Vascular: Cerebrovascular atherosclerotic calcifications are noted. Skull: Negative for fracture or focal lesion. Sinuses/Orbits: Visualized portions of the orbits are unremarkable. Visualized portions of the paranasal sinuses and mastoid air cells are unremarkable. Other: None. CT CERVICAL SPINE FINDINGS Alignment: Normal. Skull base and vertebrae: No acute fracture. No primary bone lesion or focal pathologic process. Ununited type 1 fracture of the odontoid process with posterior fusion of C2-3 with bilateral pedicle screws and a cerclage wire. Soft tissues and spinal canal: No prevertebral fluid or swelling. No visible canal hematoma. Disc levels: Degenerative disc disease with mild disc height loss at C5-6. Posterior spinal fusion at C2-3 with moderate left facet arthropathy and left foraminal stenosis. No hardware failure or complication. Moderate bilateral facet  arthropathy at C3-4 and C4-5. Bilateral foraminal narrowing at C4-5. Broad-based disc osteophyte complex at C5-6 with bilateral uncovertebral degenerative changes and moderate bilateral foraminal stenosis. Other: No fluid collection or hematoma. Bilateral carotid artery atherosclerosis. IMPRESSION: 1. No acute intracranial pathology. 2. No acute osseous injury the cervical spine. 3. Cervical spine spondylosis as described above. 4. Chronic ununited type 1 fracture of the odontoid process with posterior fusion at C2-3 without failure or complication. Electronically Signed   By: Kathreen Devoid   On: 05/17/2016 11:18   Ct Chest Wo Contrast  Result Date: 05/17/2016 CLINICAL DATA:  Fall, syncope EXAM: CT CHEST WITHOUT CONTRAST TECHNIQUE: Multidetector CT imaging of the chest was performed following the standard protocol without IV contrast. COMPARISON:  Chest CT 03/05/2016.  Chest  x-ray earlier today. FINDINGS: Cardiovascular: Heart is borderline in size. Extensive coronary artery and aortic calcifications. No evidence of aortic aneurysm. Mediastinum/Nodes: No mediastinal, hilar, or axillary adenopathy. Trachea and esophagus are unremarkable. Lungs/Pleura: No confluent airspace opacities or pleural effusions. No pneumothorax. Upper Abdomen: Layering gallstones within the gallbladder. No acute findings. Musculoskeletal: Bilateral gynecomastia. Multiple left rib fractures are noted involving the left second through eighth ribs. The second and third rib fractures are comminuted. There is no associated pneumothorax. Overlying pleural or extrapleural soft tissue thickening noted in the lateral left apex. Multiple old healed posterior right rib fractures. IMPRESSION: Left second through eighth rib fractures posterolaterally. No associated left effusion or pneumothorax. Cardiomegaly. Coronary artery disease, aortic atherosclerosis. Bilateral gynecomastia. Electronically Signed   By: Rolm Baptise M.D.   On: 05/17/2016 11:11    Ct Cervical Spine Wo Contrast  Result Date: 05/17/2016 CLINICAL DATA:  Status post fall. Unknown if the loss consciousness. Syncope. EXAM: CT HEAD WITHOUT CONTRAST CT CERVICAL SPINE WITHOUT CONTRAST TECHNIQUE: Multidetector CT imaging of the head and cervical spine was performed following the standard protocol without intravenous contrast. Multiplanar CT image reconstructions of the cervical spine were also generated. COMPARISON:  03/05/2016 FINDINGS: CT HEAD FINDINGS Brain: No evidence of acute infarction, hemorrhage, extra-axial collection, ventriculomegaly, or mass effect. Generalized cerebral atrophy. Periventricular white matter low attenuation likely secondary to microangiopathy. Vascular: Cerebrovascular atherosclerotic calcifications are noted. Skull: Negative for fracture or focal lesion. Sinuses/Orbits: Visualized portions of the orbits are unremarkable. Visualized portions of the paranasal sinuses and mastoid air cells are unremarkable. Other: None. CT CERVICAL SPINE FINDINGS Alignment: Normal. Skull base and vertebrae: No acute fracture. No primary bone lesion or focal pathologic process. Ununited type 1 fracture of the odontoid process with posterior fusion of C2-3 with bilateral pedicle screws and a cerclage wire. Soft tissues and spinal canal: No prevertebral fluid or swelling. No visible canal hematoma. Disc levels: Degenerative disc disease with mild disc height loss at C5-6. Posterior spinal fusion at C2-3 with moderate left facet arthropathy and left foraminal stenosis. No hardware failure or complication. Moderate bilateral facet arthropathy at C3-4 and C4-5. Bilateral foraminal narrowing at C4-5. Broad-based disc osteophyte complex at C5-6 with bilateral uncovertebral degenerative changes and moderate bilateral foraminal stenosis. Other: No fluid collection or hematoma. Bilateral carotid artery atherosclerosis. IMPRESSION: 1. No acute intracranial pathology. 2. No acute osseous injury the  cervical spine. 3. Cervical spine spondylosis as described above. 4. Chronic ununited type 1 fracture of the odontoid process with posterior fusion at C2-3 without failure or complication. Electronically Signed   By: Kathreen Devoid   On: 05/17/2016 11:18        Scheduled Meds: . B-complex with vitamin C  1 tablet Oral Daily  . Chlorhexidine Gluconate Cloth  6 each Topical Q0600  . doxycycline  100 mg Oral BID  . enoxaparin (LOVENOX) injection  40 mg Subcutaneous Q24H  . feeding supplement (ENSURE ENLIVE)  237 mL Oral BID BM  . folic acid  1 mg Oral Daily  . loratadine  10 mg Oral Daily  . metoprolol succinate  25 mg Oral Daily  . mupirocin ointment  1 application Nasal BID  . [START ON 05/19/2016] pantoprazole  40 mg Oral Q0600  . predniSONE  20 mg Oral Q breakfast  . QUEtiapine  300 mg Oral QHS  . sodium chloride flush  3 mL Intravenous Q12H  . thiamine  100 mg Oral Daily   Or  . thiamine  100 mg Intravenous Daily  .  traMADol  100 mg Oral TID  . traZODone  50 mg Oral QHS   Continuous Infusions: . sodium chloride 100 mL/hr at 05/18/16 0817  . sodium chloride    . magnesium sulfate 1 - 4 g bolus IVPB       LOS: 0 days    Time spent: [place], MD [facility] Pager [phone]  If 7PM-7AM, please contact night-coverage [email or web address] Password [facility] 05/18/2016, 3:36 PM

## 2016-05-18 NOTE — Consult Note (Signed)
[facility] Consult: 10:57 AM 05/18/2016  LOS: 0 days    Referring Provider: Dr Grandville Silos.     Primary Care Physician:  Lucille Passy, MD Primary Gastroenterologist:  Dr. Hilarie Fredrickson     Reason for Consultation:  Anemia.  FOBT negative.     HPI: [name] is a 71 y.o. male.  PMH BPH. Memory loss.  Unstable gait and frequent falls with previous rib fractures.  Gynecomastia bilaterally. Cardiomegaly, dilated cardiomyopathy. EF 45-50%. CAD.  Cardiac cath 05/2015 with minimal RCA disease and EF 45-50%. Aortic atherosclerosis. Gallstones seen on CT scans. alcoholism. ? Of cirrhosis but no clear evidence of this thus far.  Hepatitis A, B, C serologies negative.  GERD.  PUD secondary to NSAIDs. Adenomatous colon polyps. Iron deficiency anemia. 2006 liver biopsy showed steatohepatitis, increased iron levels consistent with alcohol abuse. Umbilical hernia.  s/p hemorrhoidectomy.  s/p spinal surgeries and ORIF hip.   09/2010 EGD. Irregular Z line, nonobstructing Schatzki's ring, 2 antral ulcers, moderate gastritis.  08/2010 colonoscopy.  Left colon diverticulosis. 6 mm colon polyp (tubular adenoma)  01/2015 abdominal ultrasound with elastography.  Multiple gallstones. CBD 42mm.  Diffusely increased  hepatic echogenicity.  Fibrosis score F3 + F4 (fibrosis risk high).   02/17/2015 colonoscopy. 4 mm cecal polyp.   Two 5 mm sessile polyps in the transverse and descending colon. Mild right sided diverticulosis. Pathology on the polyps revealed a fragment of tubular adenoma and 2 fragments of benign colorectal rectal mucosa 02/17/15 EGD. For variceal screening in cirrhotic. 2 cm segment of suspected Barrett's esophagus. Pathology revealed benign, chronically inflamed squamocolumnar mucosa with reactive changes. No intestinal metaplasia,  dysplasia or malignancy. Study otherwise normal. No esophageal varices, no portal hypertensive gastropathy.  Patient admitted yesterday after falling at home.  Unclear whether or not he lost consciousness before his fall. He was alert when his brother-in-law found him. Family reports that patient drinks at least 1 L of wine daily.  Pt admits to consuming 1/75 liters Chardonnay every 3 days. Labs revealed metabolic acidosis, hyponatremia, progressive anemia. X-rays confirmed new, multiple rib fractures.   In previous years patient had a baseline hemoglobin of about 12. On 03/05/16 hgb was 10.7, MCV 95. Yesterday hgb 8.2. This morning it is 6.  No coagulopathy. Platelets are minimally reduced at 134. Patient transfused with PRBC x 2.  ETOH level not elevated.  BUN is elevated.  Protime, INR has not been obtained. Chest CT yesterday showed multiple left rib fractures but no associated pleural effusions or pneumothorax. Incidental findings included cardiomegaly, aortic atherosclerosis, coronary artery disease and bilateral gynecomastia. Head CT shows no acute intracranial pathology.  For several weeks patient has been on doxycycline and initially a tapering dose of prednisone though the prednisone dose was recently renewed. These were prescribed by his dermatologist, Dr. [name] Crane, to address bullous pemphigoid in his left foot.   Pt takes 2 Aleve per day for pain in left foot.  Patient's weight can fluctuate 20 pounds within a few months. Currently he is in his 170s down from the 190s a  few months ago.  He says he eats well. No nausea vomiting. Stools are brown and occur daily. No diarrhea. No melena or bloody stools. No vomiting. No dysphagia. No early satiety. He has extensive purpura on his arms but does not report significant hematoma.  Patient recalls receiving blood transfusion a few years ago for low blood counts but it was not in the setting of any sort of trauma or bleeding. Maybe a year ago his PMD  prescribed oral iron but this caused intestinal upset so he stopped taking it. He's never received parenteral iron. His longest period of sobriety in recent years has been maybe 3 months a few years ago.    Past Medical History:  Diagnosis Date  . Adenomatous polyp of colon 2006  . Anemia 2012  . Anxiety   . Arthritis    "left arm/shoulder" (05/17/2016)  . Atrial tachycardia, paroxysmal ([place]) 04/07/2015  . BPH (benign prostatic hypertrophy) 5/99  . Chronic bronchitis ([place])    "q year or q other year" (11/02/2011)  . Chronic nausea    "a few days/week" (05/17/2016)  . Colon polyp   . DCM (dilated cardiomyopathy) ([place])    EF 45-50% by echo and cath 2017  . Depression    Hx of  . Falls frequently    "daily lately; legs just give out" (11/02/2011; 05/17/2016)  . GERD (gastroesophageal reflux disease) 07/31/04   gastritis   . H/O hiatal hernia   . Hard of hearing   . Heart murmur   . Hernia, umbilical    "unrepaired" (11/02/2011; 05/17/2016)  . History of blood transfusion 2012  . HLD (hyperlipidemia) 5/99  . HTN (hypertension) 5/99  . Memory loss    "recently has been forgetting alot; maybe related to the alcohol" (11/02/2011; 05/17/2016)  . Mild CAD    10% RCA  . PAC (premature atrial contraction) 04/07/2015  . Peripheral vascular disease ([place])   . Pneumonia 1996   hosp  . Pollen allergy    pollen/ragweed  . Seasonal allergies     Past Surgical History:  Procedure Laterality Date  . BACK SURGERY    . CARDIAC CATHETERIZATION N/A 05/23/2015   Procedure: Left Heart Cath and Coronary Angiography;  Surgeon: Troy Sine, MD;  Location: [facility];  Service: Cardiovascular;  Laterality: N/A;  . CATARACT EXTRACTION W/ INTRAOCULAR LENS  IMPLANT, BILATERAL Bilateral 10/2002  . COLONOSCOPY  07/31/04   multiple polyps; repeat in 3 years  . DUPUYTREN CONTRACTURE RELEASE Left   . ESOPHAGEAL VARICE LIGATION  2012  . ETT myoview  03/09/07   nml EF 66%  . FRACTURE SURGERY    .  HEMORRHOID SURGERY     "cauterized a long time ago" (11/02/2011)  . hosp CP R/O'D 2/24  03/08/07  . neck MRI  6/04   C5-6 disc abnormality  . ORIF HUMERUS FRACTURE  11/04/2011   Procedure: OPEN REDUCTION INTERNAL FIXATION (ORIF) PROXIMAL HUMERUS FRACTURE;  Surgeon: Rozanna Box, MD;  Location: [facility];  Service: Orthopedics;  Laterality: Left;  . POSTERIOR CERVICAL FUSION/FORAMINOTOMY N/A 03/21/2012   Procedure: POSTERIOR CERVICAL FUSION/FORAMINOTOMY LEVEL ONE;  Surgeon: Charlie Pitter, MD;  Location: [facility];  Service: Neurosurgery;  Laterality: N/A;  POSTERIOR CERVICAL ONE TO CERVICAL TWO FUSION WITH ILIAC CREST GRAFT AND LATERAL MASS SCREWS   . TONSILLECTOMY     "I was young" (11/02/2011)  . UPPER GASTROINTESTINAL ENDOSCOPY      Prior to Admission medications   Medication Sig  Start Date End Date Taking? Authorizing Provider  B Complex Vitamins (VITAMIN B COMPLEX PO) Take 1 tablet by mouth daily.    Yes [provider]  cetirizine (ZYRTEC) 10 MG tablet Take 10 mg by mouth daily.    Yes [provider]  doxycycline (VIBRA-TABS) 100 MG tablet Take 100 mg by mouth 2 (two) times daily. 02/27/16  Yes [provider]  lidocaine (LIDODERM) 5 % Place 1 patch onto the skin daily. Remove & Discard patch within 12 hours or as directed by MD Patient taking differently: Place 1 patch onto the skin daily as needed (pain). Remove & Discard patch within 12 hours or as directed by MD 05/11/16  Yes Lucille Passy, MD  Magnesium 500 MG TABS Take 500 mg by mouth daily as needed (energy).    Yes [provider]  mupirocin ointment (BACTROBAN) 2 % Apply 1 application topically daily. Left foot 04/22/16  Yes [provider]  naproxen sodium (ANAPROX) 220 MG tablet Take 440-880 mg by mouth 2 (two) times daily as needed (PAIN).   Yes [provider]  predniSONE (DELTASONE) 10 MG tablet Take 5 mg by mouth daily with breakfast.    Yes [provider]    promethazine (PHENERGAN) 25 MG tablet TAKE 1 TABLET DAILY AS NEEDED FOR NAUSEA 03/09/16  Yes Lucille Passy, MD  QUEtiapine (SEROQUEL) 300 MG tablet TAKE 1 TABLET AT BEDTIME 03/09/16  Yes Lucille Passy, MD  spironolactone (ALDACTONE) 25 MG tablet Take 1 tablet (25 mg total) by mouth daily. 12/22/15  Yes Lucille Passy, MD  Thiamine HCl (VITAMIN B-1) 100 MG tablet Take 100 mg by mouth daily. Reported on 02/17/2015   Yes [provider]  TOPROL XL 25 MG 24 hr tablet TAKE 1 TABLET DAILY 05/11/16  Yes Sueanne Margarita, MD  traZODone (DESYREL) 50 MG tablet TAKE 1 TABLET AT BEDTIME 03/09/16  Yes Lucille Passy, MD  valACYclovir (VALTREX) 1000 MG tablet Take 1 g by mouth 2 (two) times daily. 02/20/16  Yes [provider]    Scheduled Meds: . acetaminophen  650 mg Oral Once  . B-complex with vitamin C  1 tablet Oral Daily  . Chlorhexidine Gluconate Cloth  6 each Topical Q0600  . diphenhydrAMINE  25 mg Oral Once  . doxycycline  100 mg Oral BID  . enoxaparin (LOVENOX) injection  40 mg Subcutaneous Q24H  . feeding supplement (ENSURE ENLIVE)  237 mL Oral BID BM  . folic acid  1 mg Oral Daily  . furosemide  20 mg Intravenous Once  . loratadine  10 mg Oral Daily  . metoprolol succinate  25 mg Oral Daily  . mupirocin ointment  1 application Nasal BID  . predniSONE  20 mg Oral Q breakfast  . QUEtiapine  300 mg Oral QHS  . sodium chloride flush  3 mL Intravenous Q12H  . thiamine  100 mg Oral Daily   Or  . thiamine  100 mg Intravenous Daily  . traZODone  50 mg Oral QHS   Infusions: . sodium chloride 100 mL/hr at 05/18/16 0817  . sodium chloride     PRN Meds: HYDROmorphone (DILAUDID) injection, LORazepam **OR** LORazepam, naproxen, ondansetron (ZOFRAN) IV, oxyCODONE, prochlorperazine   Allergies as of 05/17/2016 - Review Complete 05/17/2016  Allergen Reaction Noted  . Nifedipine Swelling 07/28/2006    Family History  Problem Relation Age of Onset  . Heart disease Father   . Diabetes  Father   . Stroke  Father   . Depression Mother   . Heart disease Brother   . Alcohol abuse Brother   . Liver disease Brother     Social History   Social History  . Marital status: Married    Spouse name: N/A  . Number of children: 3  . 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 Smoking status: Former Smoker    Types: Pipe  . Smokeless tobacco: Never Used     Comment: 05/17/2016 "stopped in the late 1990s"  . Alcohol use 67.2 oz/week    112 Glasses of wine per week     Comment: 11/02/2011 "large box of wine q 2 days" 01-27-15 2 glass a day of wine; 05/17/2016 "1.75L bottles; 1- 1 1/2 bottles/day; somedays worse than others; at least 3-4 days/week he'll have 1 1/2 bottles; last drink was 05/16/2016" (05/17/2016)  . Drug use: No  . Sexual activity: No   Other Topics Concern  . Not on file   Social History Narrative   Married (remarried), lives with wife; 3 children, 1 at home.    [employer] - mixes colors       Pt revised designated party release form [employer], wife [phone] - can leave msg.     Allen Norris 03/04/10.        Would desire CPR.  Would not want prolonged life support if futile.    REVIEW OF SYSTEMS: Constitutional:  Gen. weakness. Denies fatigue. ENT:  No nose bleeds Pulm:  Stable exertional dyspnea but able to mobilize around his house in a limited fashion. CV:  No palpitations, no LE edema.  GU:  No hematuria, no frequency GI:  Per HPI Heme:  Per HPI   Transfusions: per HPI Neuro:  Gets dizzy when he stands up. If he moves slowly the dizziness Derm:  No itching, no rash or sores.  Endocrine:  No sweats or chills.  No polyuria or dysuria Immunization:   He is up-to-date on his flu, pneumonia and volunteers that he had disease plus his shingles vaccination within the last few months. Travel:  None beyond local counties in last few months.    PHYSICAL EXAM: Vital signs in  last 24 hours: Vitals:   05/18/16 0515 05/18/16 0938  BP: 106/77 118/83  Pulse: (!) 104 (!) 102  Resp: 14 12  Temp: 98.6 F (37 C) 98.5 F (36.9 C)   Wt Readings from Last 3 Encounters:  05/17/16 82 kg (180 lb 12.4 oz)  03/17/16 78.5 kg (173 lb)  03/05/16 83.9 kg (185 lb)    General: Chronically ill looking WF who is comfortable. Head:  No facial swelling, signs of head trauma or asymmetry.  Eyes:  No scleral icterus. No conjunctival pallor. Ears:  Not hard of hearing.  Nose:  No discharge or congestion. Mouth:  Some dental caries but most of his native teeth are intact. Gums/mucosa pink, moist, clear. Tongue midline. Neck:  No mass, no JVD. Lungs:  Clear bilaterally. No cough or dyspnea. Heart: Regular rate and slight tachycardia. No murmurs rubs or gallops. Abdomen:  Umbilical hernia. Obese, soft. Active bowel sounds. Unable to appreciate organomegaly. No bruits..   Rectal: abundant, soft, brown stool in vault.  FOBT negative.    Musc/Skeltl: small hematoma on left hip. No joint erythema. No contractures or gross deformities. Extremities:  Gauze bandage wrapped around the left foot was not removed to  examine the wound.  No extremity edema. Feet are warm and well perfused.  Neurologic:  He is alert. His speech is slow but precise. It takes him a while to get the words out but there is no expressive or receptive aphasia. Generalized tremor in the upper extremities but no asterixis. Moves all 4 limbs. He is oriented to place, date/year, self and current situation. Skin:  Extensive purpura on both arms. Areas of patchy dry skin and erythema on the trunk and limbs. Tattoos:  None observed. Nodes:  No cervical adenopathy.   Psych:  Calm, cooperative, pleasant.  Intake/Output from previous day: 05/07 0701 - 05/08 0700 In: 1970.4 [P.O.:60; I.V.:1910.4] Out: 1500 [Urine:1500] Intake/Output this shift: Total I/O In: 60 [P.O.:60] Out: 0   LAB RESULTS:  Recent Labs   05/17/16 0934 05/18/16 0725  WBC 17.3* 7.6  HGB 8.2* 6.0*  HCT 22.5* 16.6*  PLT 217 134*   BMET Lab Results  Component Value Date   NA 124 (L) 05/18/2016   NA 123 (L) 05/17/2016   NA 123 (L) 05/17/2016   K 5.1 05/18/2016   K 4.3 05/17/2016   K 4.8 05/17/2016   CL 93 (L) 05/18/2016   CL 90 (L) 05/17/2016   CL 89 (L) 05/17/2016   CO2 22 05/18/2016   CO2 25 05/17/2016   CO2 22 05/17/2016   GLUCOSE 99 05/18/2016   GLUCOSE 116 (H) 05/17/2016   GLUCOSE 114 (H) 05/17/2016   BUN 39 (H) 05/18/2016   BUN 43 (H) 05/17/2016   BUN 47 (H) 05/17/2016   CREATININE 0.87 05/18/2016   CREATININE 0.98 05/17/2016   CREATININE 1.04 05/17/2016   CALCIUM 7.9 (L) 05/18/2016   CALCIUM 8.6 (L) 05/17/2016   CALCIUM 9.2 05/17/2016   LFT  Recent Labs  05/17/16 0934 05/18/16 0451  PROT 5.4* 3.9*  ALBUMIN 3.2* 2.4*  AST 32 34  ALT 31 24  ALKPHOS 56 40  BILITOT 1.1 1.5*   PT/INR Lab Results  Component Value Date   INR 1.23 03/05/2016   INR 1.21 05/16/2015   INR 1.4 (H) 01/27/2015   Hepatitis Panel No results for input(s): HEPBSAG, HCVAB, HEPAIGM, HEPBIGM in the last 72 hours. C-Diff No components found for: CDIFF Lipase     Component Value Date/Time   LIPASE 26 05/17/2016 0934    Drugs of Abuse     Component Value Date/Time   LABOPIA NONE DETECTED 03/05/2016 1754   COCAINSCRNUR NONE DETECTED 03/05/2016 1754   LABBENZ NONE DETECTED 03/05/2016 1754   AMPHETMU NONE DETECTED 03/05/2016 1754   THCU NONE DETECTED 03/05/2016 1754   LABBARB NONE DETECTED 03/05/2016 1754     RADIOLOGY STUDIES: Dg Chest 2 View  Result Date: 05/17/2016 CLINICAL DATA:  Syncopal episode.  Fall with pain in the left ribs. EXAM: CHEST  2 VIEW COMPARISON:  CT of the chest 03/05/2016 and two-view chest x-ray 05/16/2015. FINDINGS: The heart is enlarged. There is no edema or effusion to suggest failure. Lung volumes are low. Posterior left second, third, and fourth rib fractures are suspected. Asymmetric  pleural thickening or fluid is noted at the left apex. IMPRESSION: 1. Suspected left posterior second, third, and fourth rib fractures. Recommend CT of the chest without contrast for further evaluation. 2. Asymmetric left apical pleural thickening or fluid likely related to the acute fractures. Electronically Signed   By: San Morelle M.D.   On: 05/17/2016 10:15   Ct Head Wo Contrast  Result Date: 05/17/2016 CLINICAL DATA:  Status post fall.  Unknown if the loss consciousness. Syncope. EXAM: CT HEAD WITHOUT CONTRAST CT CERVICAL SPINE WITHOUT CONTRAST TECHNIQUE: Multidetector CT imaging of the head and cervical spine was performed following the standard protocol without intravenous contrast. Multiplanar CT image reconstructions of the cervical spine were also generated. COMPARISON:  03/05/2016 FINDINGS: CT HEAD FINDINGS Brain: No evidence of acute infarction, hemorrhage, extra-axial collection, ventriculomegaly, or mass effect. Generalized cerebral atrophy. Periventricular white matter low attenuation likely secondary to microangiopathy. Vascular: Cerebrovascular atherosclerotic calcifications are noted. Skull: Negative for fracture or focal lesion. Sinuses/Orbits: Visualized portions of the orbits are unremarkable. Visualized portions of the paranasal sinuses and mastoid air cells are unremarkable. Other: None. CT CERVICAL SPINE FINDINGS Alignment: Normal. Skull base and vertebrae: No acute fracture. No primary bone lesion or focal pathologic process. Ununited type 1 fracture of the odontoid process with posterior fusion of C2-3 with bilateral pedicle screws and a cerclage wire. Soft tissues and spinal canal: No prevertebral fluid or swelling. No visible canal hematoma. Disc levels: Degenerative disc disease with mild disc height loss at C5-6. Posterior spinal fusion at C2-3 with moderate left facet arthropathy and left foraminal stenosis. No hardware failure or complication. Moderate bilateral facet  arthropathy at C3-4 and C4-5. Bilateral foraminal narrowing at C4-5. Broad-based disc osteophyte complex at C5-6 with bilateral uncovertebral degenerative changes and moderate bilateral foraminal stenosis. Other: No fluid collection or hematoma. Bilateral carotid artery atherosclerosis. IMPRESSION: 1. No acute intracranial pathology. 2. No acute osseous injury the cervical spine. 3. Cervical spine spondylosis as described above. 4. Chronic ununited type 1 fracture of the odontoid process with posterior fusion at C2-3 without failure or complication. Electronically Signed   By: Kathreen Devoid   On: 05/17/2016 11:18   Ct Chest Wo Contrast  Result Date: 05/17/2016 CLINICAL DATA:  Fall, syncope EXAM: CT CHEST WITHOUT CONTRAST TECHNIQUE: Multidetector CT imaging of the chest was performed following the standard protocol without IV contrast. COMPARISON:  Chest CT 03/05/2016.  Chest x-ray earlier today. FINDINGS: Cardiovascular: Heart is borderline in size. Extensive coronary artery and aortic calcifications. No evidence of aortic aneurysm. Mediastinum/Nodes: No mediastinal, hilar, or axillary adenopathy. Trachea and esophagus are unremarkable. Lungs/Pleura: No confluent airspace opacities or pleural effusions. No pneumothorax. Upper Abdomen: Layering gallstones within the gallbladder. No acute findings. Musculoskeletal: Bilateral gynecomastia. Multiple left rib fractures are noted involving the left second through eighth ribs. The second and third rib fractures are comminuted. There is no associated pneumothorax. Overlying pleural or extrapleural soft tissue thickening noted in the lateral left apex. Multiple old healed posterior right rib fractures. IMPRESSION: Left second through eighth rib fractures posterolaterally. No associated left effusion or pneumothorax. Cardiomegaly. Coronary artery disease, aortic atherosclerosis. Bilateral gynecomastia. Electronically Signed   By: Rolm Baptise M.D.   On: 05/17/2016 11:11    Ct Cervical Spine Wo Contrast  Result Date: 05/17/2016 CLINICAL DATA:  Status post fall. Unknown if the loss consciousness. Syncope. EXAM: CT HEAD WITHOUT CONTRAST CT CERVICAL SPINE WITHOUT CONTRAST TECHNIQUE: Multidetector CT imaging of the head and cervical spine was performed following the standard protocol without intravenous contrast. Multiplanar CT image reconstructions of the cervical spine were also generated. COMPARISON:  03/05/2016 FINDINGS: CT HEAD FINDINGS Brain: No evidence of acute infarction, hemorrhage, extra-axial collection, ventriculomegaly, or mass effect. Generalized cerebral atrophy. Periventricular white matter low attenuation likely secondary to microangiopathy. Vascular: Cerebrovascular atherosclerotic calcifications are noted. Skull: Negative for fracture or focal lesion. Sinuses/Orbits: Visualized portions of the orbits are unremarkable. Visualized portions of the paranasal sinuses and mastoid air  cells are unremarkable. Other: None. CT CERVICAL SPINE FINDINGS Alignment: Normal. Skull base and vertebrae: No acute fracture. No primary bone lesion or focal pathologic process. Ununited type 1 fracture of the odontoid process with posterior fusion of C2-3 with bilateral pedicle screws and a cerclage wire. Soft tissues and spinal canal: No prevertebral fluid or swelling. No visible canal hematoma. Disc levels: Degenerative disc disease with mild disc height loss at C5-6. Posterior spinal fusion at C2-3 with moderate left facet arthropathy and left foraminal stenosis. No hardware failure or complication. Moderate bilateral facet arthropathy at C3-4 and C4-5. Bilateral foraminal narrowing at C4-5. Broad-based disc osteophyte complex at C5-6 with bilateral uncovertebral degenerative changes and moderate bilateral foraminal stenosis. Other: No fluid collection or hematoma. Bilateral carotid artery atherosclerosis. IMPRESSION: 1. No acute intracranial pathology. 2. No acute osseous injury the  cervical spine. 3. Cervical spine spondylosis as described above. 4. Chronic ununited type 1 fracture of the odontoid process with posterior fusion at C2-3 without failure or complication. Electronically Signed   By: Kathreen Devoid   On: 05/17/2016 11:18      IMPRESSION:   *  Acute anemia without obvious blood loss.  FOBT negative.   With this rapid decline, if it were a GIB would expect to see red blood or melena which is not the case.  Wonder if he has developed and intra abdominal or retroperitoneal hemorrage?    *  Chronic alcoholism and fatty liver.  No evidence for evolution to cirrhosis.   *  Colonoscopy and upper endoscopy 15 months ago revealing colon polyps  *  Rib fractures following a fall.  Known gait instability and frequent falls. Patient deconditioned.  *  Bullous pemphygoid of left foot.  On Doxy and prednisone.     PLAN:     *  Consider abdominal CT to rule out RP hemorrhage.  No need for EGD or colonoscopy  *  With him now taking prednisone for several weeks, and hx of esophagitis on last years EGD, adding empiric 1 x daily oral Protonix.    Azucena Freed  05/18/2016, 10:57 AM Pager: [phone]  GI ATTENDING  History, laboratories, x-rays, previous endoscopy reports reviewed. Patient seen and examined. Agree with comprehensive consultation note as outlined above. Extremely complicated (medically) patient with the above listed issues. We're asked to see for anemia. No evidence for GI bleeding. He has had a relatively recent colonoscopy and upper endoscopy with the results as outlined. Stool is Hemoccult negative and brown. Multiple other perturbation's including hyponatremia. Agree with considering CT scan to rule out retroperitoneal hemorrhage as a cause for anemia. If this is negative an anemia persists, you may wish to obtain a hematology consult. Nothing further to add from GI perspective. Thank you. Will sign off but are available for questions or new issues.  Docia Chuck. Geri Seminole., M.D. [facility] Division of Gastroenterology

## 2016-05-19 ENCOUNTER — Inpatient Hospital Stay (HOSPITAL_COMMUNITY): Payer: Medicare Other

## 2016-05-19 DIAGNOSIS — E871 Hypo-osmolality and hyponatremia: Secondary | ICD-10-CM

## 2016-05-19 DIAGNOSIS — S2242XA Multiple fractures of ribs, left side, initial encounter for closed fracture: Secondary | ICD-10-CM

## 2016-05-19 DIAGNOSIS — D649 Anemia, unspecified: Principal | ICD-10-CM

## 2016-05-19 DIAGNOSIS — F1010 Alcohol abuse, uncomplicated: Secondary | ICD-10-CM

## 2016-05-19 DIAGNOSIS — L0390 Cellulitis, unspecified: Secondary | ICD-10-CM

## 2016-05-19 DIAGNOSIS — R55 Syncope and collapse: Secondary | ICD-10-CM

## 2016-05-19 LAB — COMPREHENSIVE METABOLIC PANEL
ALBUMIN: 2.6 g/dL — AB (ref 3.5–5.0)
ALK PHOS: 54 U/L (ref 38–126)
ALT: 26 U/L (ref 17–63)
ANION GAP: 9 (ref 5–15)
AST: 24 U/L (ref 15–41)
BILIRUBIN TOTAL: 1.5 mg/dL — AB (ref 0.3–1.2)
BUN: 30 mg/dL — AB (ref 6–20)
CALCIUM: 7.8 mg/dL — AB (ref 8.9–10.3)
CO2: 24 mmol/L (ref 22–32)
CREATININE: 0.92 mg/dL (ref 0.61–1.24)
Chloride: 92 mmol/L — ABNORMAL LOW (ref 101–111)
GFR calc Af Amer: >60 mL/min (ref >60)
GFR calc non Af Amer: >60 mL/min (ref >60)
GLUCOSE: 124 mg/dL — AB (ref 65–99)
Potassium: 3.9 mmol/L (ref 3.5–5.1)
SODIUM: 125 mmol/L — AB (ref 135–145)
Total Protein: 4.5 g/dL — ABNORMAL LOW (ref 6.5–8.1)

## 2016-05-19 LAB — CBC WITH DIFFERENTIAL/PLATELET
BASOS PCT: 0 %
Basophils Absolute: 0 10*3/uL (ref 0.0–0.1)
EOS ABS: 0 10*3/uL (ref 0.0–0.7)
EOS PCT: 0 %
HCT: 21.3 % — ABNORMAL LOW (ref 39.0–52.0)
HEMOGLOBIN: 7.7 g/dL — AB (ref 13.0–17.0)
Lymphocytes Relative: 14 %
Lymphs Abs: 1.7 10*3/uL (ref 0.7–4.0)
MCH: 31.6 pg (ref 26.0–34.0)
MCHC: 36.2 g/dL — AB (ref 30.0–36.0)
MCV: 87.3 fL (ref 78.0–100.0)
MONO ABS: 1.4 10*3/uL — AB (ref 0.1–1.0)
Monocytes Relative: 12 %
NEUTROS ABS: 8.8 10*3/uL — AB (ref 1.7–7.7)
Neutrophils Relative %: 74 %
PLATELETS: 125 10*3/uL — AB (ref 150–400)
RBC: 2.44 MIL/uL — ABNORMAL LOW (ref 4.22–5.81)
RDW: 15.2 % (ref 11.5–15.5)
WBC: 11.9 10*3/uL — ABNORMAL HIGH (ref 4.0–10.5)

## 2016-05-19 LAB — GLUCOSE, CAPILLARY: GLUCOSE-CAPILLARY: 118 mg/dL — AB (ref 65–99)

## 2016-05-19 LAB — TROPONIN I: Troponin I: <0.03 ng/mL (ref <0.03)

## 2016-05-19 LAB — MAGNESIUM: MAGNESIUM: 2.5 mg/dL — AB (ref 1.7–2.4)

## 2016-05-19 LAB — PROTIME-INR
INR: 1.25
Prothrombin Time: 15.8 seconds — ABNORMAL HIGH (ref 11.4–15.2)

## 2016-05-19 LAB — CORTISOL: CORTISOL PLASMA: 15.7 ug/dL

## 2016-05-19 MED ORDER — NITROGLYCERIN 0.4 MG SL SUBL
SUBLINGUAL_TABLET | SUBLINGUAL | Status: AC
  Administered 2016-05-19: 0.4 mg
  Filled 2016-05-19: qty 1

## 2016-05-19 MED ORDER — NITROGLYCERIN 0.4 MG SL SUBL: 0.4000 mg | SUBLINGUAL_TABLET | SUBLINGUAL | Status: DC | PRN

## 2016-05-19 MED ORDER — FUROSEMIDE 10 MG/ML IJ SOLN
20.0000 mg | Freq: Every day | INTRAMUSCULAR | Status: DC
  Administered 2016-05-19 – 2016-05-22 (×4): 20 mg via INTRAVENOUS
  Filled 2016-05-19 (×4): qty 2

## 2016-05-19 MED ORDER — GI COCKTAIL ~~LOC~~
30.0000 mL | Freq: Once | ORAL | Status: AC
  Administered 2016-05-19: 30 mL via ORAL
  Filled 2016-05-19: qty 30

## 2016-05-19 MED ORDER — FAMOTIDINE 20 MG PO TABS
20.0000 mg | ORAL_TABLET | Freq: Every day | ORAL | Status: DC | PRN
  Administered 2016-05-19 – 2016-05-21 (×4): 20 mg via ORAL
  Filled 2016-05-19 (×4): qty 1

## 2016-05-19 MED ORDER — NITROGLYCERIN 0.4 MG SL SUBL
SUBLINGUAL_TABLET | SUBLINGUAL | Status: AC
  Filled 2016-05-19: qty 1

## 2016-05-19 NOTE — Progress Notes (Signed)
PROGRESS NOTE    [name]  ONG:[id number] DOB: 30-Dec-1945 DOA: 05/17/2016 PCP: Lucille Passy, MD   Brief Narrative: 71 year old male with history of alcohol abuse, memory loss, PPS, frequent falls presented after a fall and sustaining ribs fracture. On admission patient was found to have a drop in hemoglobin and possible syncopal episode with significant hyponatremia.  Assessment & Plan:  # Symptomatic anemia: Of unknown etiology -Patient has no sign of bleeding. Evaluated by GI, recommended no further evaluation this time. Vitamin G40, folic acid level, iron stores acceptable. Patient received red blood cell transfusion during this hospitalization. CT scan of abdomen with no bleeding. Hematology consult requested today. Discussed with Dr.Shadad.  #Syncope versus presyncopal episode leading to fall with rips fracture: CT head unremarkable. Echo pending. Monitor electrolytes, blood pressure and provide supportive care.  #Multiple left-sided ribs fracture: Continue supportive care with pain management and incentive spirometer.  # Pain management: Patient with somnolence today likely contributed by pain medication. Continue oral oxycodone as needed. I discontinued IV Dilaudid and tramadol. Encourage incentive spirometer.  #Hyponatremia likely in the setting of alcohol abuse: Patient with lower extremity edema. No improvement in serum sodium level with normal saline IV. I discontinued IV fluids today. Started Lasix IV daily. Monitor BMP.  #Left-sided chest pain due to rib fracture. EKG with no acute change. Troponin negative. Echo pending.  #Alcohol abuse: Continue CIWA protocol. Currently on Ativan as needed.  #History of bullous pemphigus left foot: Currently on oral antibiotics and tapering dose of steroid. As per medical record patient was on 4 days of oral antibiotics before admission.  # Thrombocytopenia: Unknown if it is related with alcohol and liver disease. No cirrhosis  mentioned in CT scan of abdomen and pelvis. I discontinued Lovenox subcutaneous. Monitor CBC.  #Physical deconditioning: Discussed with the patient's wife at bedside. May need rehabilitation. Continue PT OT evaluation.  Principal Problem:   Syncope Active Problems:   HLD (hyperlipidemia)   Anxiety state   Alcohol abuse   Essential hypertension   BPH (benign prostatic hyperplasia)   Hyponatremia   Chronic pain syndrome   Cellulitis   Rib fractures   Bullous pemphigoid   Symptomatic anemia   Syncope and collapse  DVT prophylaxis: SCD. Discontinue Lovenox because of thrombocytopenia and anemia Code Status: Full code Family Communication: Discussed with the patient's wife at bedside Disposition Plan: Likely discharge to rehabilitation in 1-2 days  Consultants:   GI  Hematology  Procedures:  CT chest 05/17/2016  CT head and CT C-spine 05/17/2016  Chest x-ray 05/17/2016  2 units packed red blood cells 05/18/2016 Antimicrobials: doxy Subjective: Patient was seen and examined at bedside. He was somnolent but easily arousable. Denies nausea vomiting or abdominal pain. Reports left-sided chest pain associated with movement. Wife at bedside  Objective: Vitals:   05/19/16 0412 05/19/16 0428 05/19/16 0646 05/19/16 0808  BP: 133/88  110/78 104/76  Pulse: 86  93 100  Resp: 16  14 16   Temp: 98.4 F (36.9 C)  98.3 F (36.8 C) 98 F (36.7 C)  TempSrc:    Oral  SpO2: 95%  97% 99%  Weight:  85.7 kg (188 lb 15 oz)    Height:        Intake/Output Summary (Last 24 hours) at 05/19/16 1209 Last data filed at 05/19/16 1201  Gross per 24 hour  Intake          3077.58 ml  Output  1400 ml  Net          1677.58 ml   Filed Weights   05/17/16 0926 05/17/16 2001 05/19/16 0428  Weight: 83.9 kg (185 lb) 82 kg (180 lb 12.4 oz) 85.7 kg (188 lb 15 oz)    Examination:  General exam: Somnolent but easily arousable, not in distress  Respiratory system: Clear to  auscultation. Respiratory effort normal. No wheezing or crackle Cardiovascular system: S1 & S2 heard, RRR.  Gastrointestinal system: Abdomen is soft and nontender. Normal bowel sounds heard. Central nervous system: Alert awake and following commands Extremities: Bilateral pitting lower extremity edema Skin: No rashes, lesions or ulcers    Data Reviewed: I have personally reviewed following labs and imaging studies  CBC:  Recent Labs Lab 05/17/16 0934 05/18/16 0725 05/18/16 1820 05/19/16 0506  WBC 17.3* 7.6  --  11.9*  NEUTROABS 13.5*  --   --  8.8*  HGB 8.2* 6.0* 8.0* 7.7*  HCT 22.5* 16.6* 22.9* 21.3*  MCV 88.2 88.8  --  87.3  PLT 217 134*  --  749*   Basic Metabolic Panel:  Recent Labs Lab 05/17/16 0934 05/17/16 1449 05/18/16 0451 05/18/16 0950 05/19/16 0506  NA 123* 123* 124*  --  125*  K 4.8 4.3 5.1  --  3.9  CL 89* 90* 93*  --  92*  CO2 22 25 22   --  24  GLUCOSE 114* 116* 99  --  124*  BUN 47* 43* 39*  --  30*  CREATININE 1.04 0.98 0.87  --  0.92  CALCIUM 9.2 8.6* 7.9*  --  7.8*  MG  --   --   --  1.7 2.5*   GFR: Estimated Creatinine Clearance: 82.2 mL/min (by C-G formula based on SCr of 0.92 mg/dL). Liver Function Tests:  Recent Labs Lab 05/17/16 0934 05/18/16 0451 05/19/16 0506  AST 32 34 24  ALT 31 24 26   ALKPHOS 56 40 54  BILITOT 1.1 1.5* 1.5*  PROT 5.4* 3.9* 4.5*  ALBUMIN 3.2* 2.4* 2.6*    Recent Labs Lab 05/17/16 0934  LIPASE 26   No results for input(s): AMMONIA in the last 168 hours. Coagulation Profile:  Recent Labs Lab 05/19/16 0506  INR 1.25   Cardiac Enzymes:  Recent Labs Lab 05/17/16 0934 05/19/16 0743  TROPONINI <0.03 <0.03   BNP (last 3 results) No results for input(s): PROBNP in the last 8760 hours. HbA1C: No results for input(s): HGBA1C in the last 72 hours. CBG:  Recent Labs Lab 05/18/16 0755 05/19/16 0805  GLUCAP 114* 118*   Lipid Profile: No results for input(s): CHOL, HDL, LDLCALC, TRIG, CHOLHDL,  LDLDIRECT in the last 72 hours. Thyroid Function Tests: No results for input(s): TSH, T4TOTAL, FREET4, T3FREE, THYROIDAB in the last 72 hours. Anemia Panel:  Recent Labs  05/17/16 1449  VITAMINB12 6,041*  FOLATE 15.1  FERRITIN 421*  TIBC 154*  IRON 142  RETICCTPCT 2.7   Sepsis Labs: No results for input(s): PROCALCITON, LATICACIDVEN in the last 168 hours.  Recent Results (from the past 240 hour(s))  MRSA PCR Screening     Status: Abnormal   Collection Time: 05/17/16 10:42 PM  Result Value Ref Range Status   MRSA by PCR POSITIVE (A) NEGATIVE Final    Comment:        The GeneXpert MRSA Assay (FDA approved for NASAL specimens only), is one component of a comprehensive MRSA colonization surveillance program. It is not intended to diagnose MRSA infection nor to guide  or monitor treatment for MRSA infections. RESULT CALLED TO, READ BACK BY AND VERIFIED WITH: CALLED TO V.BROWN AT 0317 BY L.PITT 05/18/16          Radiology Studies: Ct Abdomen Pelvis W Contrast  Result Date: 05/18/2016 CLINICAL DATA:  Anemia. EXAM: CT ABDOMEN AND PELVIS WITH CONTRAST TECHNIQUE: Multidetector CT imaging of the abdomen and pelvis was performed using the standard protocol following bolus administration of intravenous contrast. CONTRAST:  174mL ISOVUE-300 IOPAMIDOL (ISOVUE-300) INJECTION 61% COMPARISON:  Abdominal CT 06/23/2009.  Chest CT yesterday. FINDINGS: Lower chest: Small left pleural effusion and adjacent atelectasis, new from chest CT yesterday. Mild cardiomegaly. Hepatobiliary: Mild diffuse decreased hepatic density consistent with steatosis. No focal hepatic lesion. Gallbladder is distended with layering hyperdensity, possibly sludge or small stones. No CT findings of wall thickening or pericholecystic inflammation. No biliary dilatation. Pancreas: No ductal dilatation or inflammation. Spleen: Normal in size without focal abnormality. Adrenals/Urinary Tract: No adrenal nodule. No  hydronephrosis. Duplicated left renal collecting system. There is mild right renal atrophy. Symmetric enhancement and excretion on delayed phase imaging. Mild symmetric nonspecific perinephric edema appears similar to prior exam. Small exophytic cyst from the lower left kidney. Urinary bladder is physiologically distended, no wall thickening. Stomach/Bowel: Stomach physiologically distended. No evidence of bowel wall thickening, distention or inflammation. Moderate stool burden with tortuosity of the hepatic flexure. Small to moderate diverticulosis of the distal descending and sigmoid colon without acute inflammation. Normal appendix. Vascular/Lymphatic: Aortic atherosclerosis without aneurysm. No abdominal or pelvic adenopathy. Reproductive: Prostate is unremarkable. Other: Soft tissue edema about the left lateral abdominal wall contains small foci of air, may be secondary to IV placement. Tiny air within the right inferior epigastric vessels likely secondary to IV placement. Small to moderate-sized fat containing umbilical hernia. There is fat in the left inguinal canal. Mild dependent body wall edema. No ascites. Musculoskeletal: Acute posterior left rib fractures as characterized on recent chest CT. Mild compression deformity superior endplate of the L1, appears remote. Mild chronic change about both hips. IMPRESSION: 1. Colonic diverticulosis without acute inflammation. 2. Moderate stool burden with colonic tortuosity, suggesting constipation. 3. Hepatic steatosis. 4. Gallbladder distention containing narrowing high density, likely combination of small stones and sludge. No pericholecystic inflammation or wall thickening. 5. Fat containing umbilical hernia. 6. Small left pleural effusion and adjacent atelectasis, new from chest CT yesterday. Acute left rib fractures are partially included. 7. Aortic atherosclerosis. Electronically Signed   By: Jeb Levering M.D.   On: 05/18/2016 23:20         Scheduled Meds: . B-complex with vitamin C  1 tablet Oral Daily  . Chlorhexidine Gluconate Cloth  6 each Topical Q0600  . doxycycline  100 mg Oral BID  . enoxaparin (LOVENOX) injection  40 mg Subcutaneous Q24H  . feeding supplement (ENSURE ENLIVE)  237 mL Oral BID BM  . folic acid  1 mg Oral Daily  . furosemide  20 mg Intravenous Daily  . loratadine  10 mg Oral Daily  . metoprolol succinate  25 mg Oral Daily  . mupirocin ointment  1 application Nasal BID  . nitroGLYCERIN      . pantoprazole  40 mg Oral Q0600  . QUEtiapine  300 mg Oral QHS  . sodium chloride flush  3 mL Intravenous Q12H  . thiamine  100 mg Oral Daily   Or  . thiamine  100 mg Intravenous Daily  . traMADol  100 mg Oral TID  . traZODone  50 mg Oral QHS  Continuous Infusions: . sodium chloride       LOS: 1 day    Ruthanna Macchia Tanna Furry, MD [facility] Pager [phone]  If 7PM-7AM, please contact night-coverage [email or web address] Password [email or web address] 05/19/2016, 12:09 PM

## 2016-05-19 NOTE — Evaluation (Signed)
Physical Therapy Evaluation Patient Details Name: [name] MRN: [id number] DOB: 26-Sep-1945 Today's Date: 05/19/2016   History of Present Illness  [name] is a 71 y.o. male with medical history significant for EtOH abuse, EtOH cirrhosis, hypertension, GERD, frequent falls, BPH, memory loss presents to the emergency Department chief complaint of fall. Initial evaluation reveals hyponatremia metabolic acidosis and multiple fx rib and worsening anemia  Clinical Impression   Pt admitted with above diagnosis. Pt currently with functional limitations due to the deficits listed below (see PT Problem List). Presents with significantly high fall risk, and dependencies in functional mobility; Recommend SNF for post-acute rehab to maximize independence and safety with mobility prior to dc home;  Pt will benefit from skilled PT to increase their independence and safety with mobility to allow discharge to the venue listed below.       Follow Up Recommendations SNF    Equipment Recommendations  Rolling walker with 5" wheels;3in1 (PT)    Recommendations for Other Services       Precautions / Restrictions Precautions Precautions: Fall      Mobility  Bed Mobility Overal bed mobility: Needs Assistance Bed Mobility: Supine to Sit     Supine to sit: Mod assist     General bed mobility comments: light mod assist to pull to sit  Transfers Overall transfer level: Needs assistance Equipment used: Rolling walker (2 wheeled) Transfers: Sit to/from Stand Sit to Stand: Mod assist;+2 safety/equipment         General transfer comment: Light mod assist to power up and steady at initial stand  Ambulation/Gait Ambulation/Gait assistance: Mod assist;+2 physical assistance Ambulation Distance (Feet): 5 Feet Assistive device: Rolling walker (2 wheeled) Gait Pattern/deviations: Decreased step length - right;Decreased step length - left;Decreased stance time - right;Decreased stance time -  left;Narrow base of support   Gait velocity interpretation: Below normal speed for age/gender General Gait Details: decr weight shift into single limb stance with wide BOS; inefficient gait pattern, small, shuffling steps  Stairs            Wheelchair Mobility    Modified Rankin (Stroke Patients Only)       Balance                                             Pertinent Vitals/Pain Pain Assessment: Faces Faces Pain Scale: Hurts little more Pain Location: L ribs during incentive spirometry Pain Descriptors / Indicators: Grimacing Pain Intervention(s): Monitored during session    Home Living Family/patient expects to be discharged to:: Private residence Living Arrangements: Spouse/significant other;Children Available Help at Discharge: Family;Available PRN/intermittently (wife works) Type of Home: House Home Access: Stairs to enter   Technical brewer of Steps: 6 Home Layout: Two level;Bed/bath upstairs        Prior Function Level of Independence: Needs assistance   Gait / Transfers Assistance Needed: RW prn; per wife, pt is quite sedentary  ADL's / Homemaking Assistance Needed: per wife, pt has not been able to go upstairs to shower during the month PTA        Hand Dominance   Dominant Hand: Right    Extremity/Trunk Assessment   Upper Extremity Assessment Upper Extremity Assessment: Generalized weakness (Decr L shoulder flex compared to R)    Lower Extremity Assessment Lower Extremity Assessment: Generalized weakness       Communication   Communication:  No difficulties  Cognition Arousal/Alertness: Awake/alert Behavior During Therapy: WFL for tasks assessed/performed Overall Cognitive Status: No family/caregiver present to determine baseline cognitive functioning                                        General Comments General comments (skin integrity, edema, etc.): Session conducted on Room Air; O2 sats  remained greater than or equal to 91%    Exercises     Assessment/Plan    PT Assessment Patient needs continued PT services  PT Problem List Decreased strength;Decreased range of motion;Decreased activity tolerance;Decreased balance;Decreased mobility;Decreased coordination;Decreased cognition;Decreased knowledge of use of DME;Decreased safety awareness;Pain       PT Treatment Interventions DME instruction;Gait training;Functional mobility training;Therapeutic activities;Therapeutic exercise;Balance training;Neuromuscular re-education;Patient/family education    PT Goals (Current goals can be found in the Care Plan section)  Acute Rehab PT Goals Patient Stated Goal: Did not state, but is agreeable to SNF for rehab PT Goal Formulation: With patient Time For Goal Achievement: 06/02/16 Potential to Achieve Goals: Good    Frequency Min 3X/week   Barriers to discharge        Co-evaluation               AM-PAC PT "6 Clicks" Daily Activity  Outcome Measure Difficulty turning over in bed (including adjusting bedclothes, sheets and blankets)?: Total Difficulty moving from lying on back to sitting on the side of the bed? : Total Difficulty sitting down on and standing up from a chair with arms (e.g., wheelchair, bedside commode, etc,.)?: A Lot Help needed moving to and from a bed to chair (including a wheelchair)?: A Lot Help needed walking in hospital room?: A Lot Help needed climbing 3-5 steps with a railing? : Total 6 Click Score: 9    End of Session Equipment Utilized During Treatment: Gait belt Activity Tolerance: Patient tolerated treatment well Patient left: in chair;with call bell/phone within reach;with chair alarm set Nurse Communication: Mobility status PT Visit Diagnosis: Unsteadiness on feet (R26.81);Muscle weakness (generalized) (M62.81);Pain Pain - Right/Left: Left Pain - part of body:  (Ribs)    Time: 6734-1937 PT Time Calculation (min) (ACUTE ONLY): 26  min   Charges:   PT Evaluation $PT Eval Moderate Complexity: 1 Procedure PT Treatments $Gait Training: 8-22 mins   PT G Codes:        Roney Marion, PT  Acute Rehabilitation Services Pager [phone] Office [place] 05/19/2016, 4:42 PM

## 2016-05-19 NOTE — Consult Note (Signed)
Reason for Referral: Anemia.   HPI: 71 year old gentleman admitted on 05/17/2016 after presenting with fall and near syncopal episode. His initial evaluation revealed hyponatremia as well as multiple rib fractures. His CBC on admission and noted to be low at a hemoglobin of 8.2. His white cell count was elevated at 17.3 with normal platelets. He had normal differential. His hemoglobin drifted during his hospitalization to 6.0 on 05/18/2016. He received 2 units of packed red cell transfusion and his hemoglobin on 05/19/2016 was 7.7. His reticulocyte count was normal. His creatinine was normal, slightly elevated at 1.5 with normal AST and ALT. CT scan chest abdomen and pelvis did not reveal any bleeding or hematoma. His last colonoscopy and endoscopy done in February 2017. His Hemoccult testing was negative. He denied any clear-cut hematochezia, melena or epistaxis. His wife reports that he is appetite has been poor recently and continues to drink heavily on a daily basis.  He denied any headaches, blurry vision, or seizures. He does not report any fevers, chills, sweats or weight loss. He does not report any chest pain, palpitation, orthopnea or leg edema. He does not report any cough, wheezing or hemoptysis. He is not reporting nausea, vomiting or abdominal pain. He does not report any frequency urgency or hesitancy. He is not report any skeletal complaints. Remaining review of systems unremarkable.   Past Medical History:  Diagnosis Date  . Adenomatous polyp of colon 2006  . Anemia 2012  . Anxiety   . Arthritis    "left arm/shoulder" (05/17/2016)  . Atrial tachycardia, paroxysmal ([place]) 04/07/2015  . BPH (benign prostatic hypertrophy) 5/99  . Chronic bronchitis ([place])    "q year or q other year" (11/02/2011)  . Chronic nausea    "a few days/week" (05/17/2016)  . Colon polyp   . DCM (dilated cardiomyopathy) ([place])    EF 45-50% by echo and cath 2017  . Depression    Hx of  . Falls frequently     "daily lately; legs just give out" (11/02/2011; 05/17/2016)  . GERD (gastroesophageal reflux disease) 07/31/04   gastritis   . H/O hiatal hernia   . Hard of hearing   . Heart murmur   . Hernia, umbilical    "unrepaired" (11/02/2011; 05/17/2016)  . History of blood transfusion 2012  . HLD (hyperlipidemia) 5/99  . HTN (hypertension) 5/99  . Memory loss    "recently has been forgetting alot; maybe related to the alcohol" (11/02/2011; 05/17/2016)  . Mild CAD    10% RCA  . PAC (premature atrial contraction) 04/07/2015  . Peripheral vascular disease ([place])   . Pneumonia 1996   hosp  . Pollen allergy    pollen/ragweed  . Seasonal allergies   :  Past Surgical History:  Procedure Laterality Date  . BACK SURGERY    . CARDIAC CATHETERIZATION N/A 05/23/2015   Procedure: Left Heart Cath and Coronary Angiography;  Surgeon: Troy Sine, MD;  Location: [place] [place] [facility];  Service: Cardiovascular;  Laterality: N/A;  . CATARACT EXTRACTION W/ INTRAOCULAR LENS  IMPLANT, BILATERAL Bilateral 10/2002  . COLONOSCOPY  07/31/04   multiple polyps; repeat in 3 years  . DUPUYTREN CONTRACTURE RELEASE Left   . ETT myoview  03/09/07   nml EF 66%  . FRACTURE SURGERY    . HEMORRHOID SURGERY     "cauterized a long time ago" (11/02/2011)  . hosp CP R/O'D 2/24  03/08/07  . neck MRI  6/04   C5-6 disc abnormality  . ORIF HUMERUS FRACTURE  11/04/2011   Procedure: OPEN REDUCTION INTERNAL FIXATION (ORIF) PROXIMAL HUMERUS FRACTURE;  Surgeon: Rozanna Box, MD;  Location: [facility];  Service: Orthopedics;  Laterality: Left;  . POSTERIOR CERVICAL FUSION/FORAMINOTOMY N/A 03/21/2012   Procedure: POSTERIOR CERVICAL FUSION/FORAMINOTOMY LEVEL ONE;  Surgeon: Charlie Pitter, MD;  Location: [place] [facility];  Service: Neurosurgery;  Laterality: N/A;  POSTERIOR CERVICAL ONE TO CERVICAL TWO FUSION WITH ILIAC CREST GRAFT AND LATERAL MASS SCREWS   . TONSILLECTOMY     "I was young" (11/02/2011)  . UPPER GASTROINTESTINAL ENDOSCOPY     :   Current Facility-Administered Medications:  .  0.9 %  sodium chloride infusion, , Intravenous, Once, Eugenie Filler, MD .  B-complex with vitamin C tablet 1 tablet, 1 tablet, Oral, Daily, Waldemar Dickens, MD, 1 tablet at 05/19/16 1041 .  Chlorhexidine Gluconate Cloth 2 % PADS 6 each, 6 each, Topical, Q0600, Waldemar Dickens, MD, 6 each at 05/18/16 0530 .  doxycycline (VIBRA-TABS) tablet 100 mg, 100 mg, Oral, BID, Waldemar Dickens, MD, 100 mg at 05/19/16 0841 .  famotidine (PEPCID) tablet 20 mg, 20 mg, Oral, Daily PRN, Kirby-Graham, Karsten Fells, NP, 20 mg at 05/19/16 0540 .  feeding supplement (ENSURE ENLIVE) (ENSURE ENLIVE) liquid 237 mL, 237 mL, Oral, BID BM, Waldemar Dickens, MD, 237 mL at 05/18/16 1500 .  folic acid (FOLVITE) tablet 1 mg, 1 mg, Oral, Daily, Black, Karen M, NP, 1 mg at 05/19/16 1040 .  furosemide (LASIX) injection 20 mg, 20 mg, Intravenous, Daily, Rosita Fire, MD, 20 mg at 05/19/16 1327 .  loratadine (CLARITIN) tablet 10 mg, 10 mg, Oral, Daily, Black, Karen M, NP, 10 mg at 05/19/16 1040 .  LORazepam (ATIVAN) tablet 1 mg, 1 mg, Oral, Q6H PRN, 1 mg at 05/19/16 0511 **OR** LORazepam (ATIVAN) injection 1 mg, 1 mg, Intravenous, Q6H PRN, Black, Karen M, NP .  metoprolol succinate (TOPROL-XL) 24 hr tablet 25 mg, 25 mg, Oral, Daily, Black, Karen M, NP, 25 mg at 05/19/16 1039 .  mupirocin ointment (BACTROBAN) 2 % 1 application, 1 application, Nasal, BID, Waldemar Dickens, MD, 1 application at 19/14/78 1042 .  nitroGLYCERIN (NITROSTAT) 0.4 MG SL tablet, , , ,  .  nitroGLYCERIN (NITROSTAT) SL tablet 0.4 mg, 0.4 mg, Sublingual, Q5 min PRN, Kirby-Graham, Karsten Fells, NP .  ondansetron [facility]) injection 4 mg, 4 mg, Intravenous, Q4H PRN, Black, Karen M, NP, 4 mg at 05/19/16 0248 .  oxyCODONE (Oxy IR/ROXICODONE) immediate release tablet 5 mg, 5 mg, Oral, Q6H PRN, Radene Gunning, NP, 5 mg at 05/19/16 0511 .  pantoprazole (PROTONIX) EC tablet 40 mg, 40 mg, Oral, Q0600, Vena Rua, PA-C, 40 mg at 05/19/16 0511 .  prochlorperazine (COMPAZINE) injection 10 mg, 10 mg, Intravenous, Q6H PRN, Eugenie Filler, MD, 10 mg at 05/18/16 1830 .  QUEtiapine (SEROQUEL) tablet 300 mg, 300 mg, Oral, QHS, Black, Karen M, NP, 300 mg at 05/18/16 2256 .  sodium chloride flush (NS) 0.9 % injection 3 mL, 3 mL, Intravenous, Q12H, Black, Karen M, NP, 3 mL at 05/18/16 2029 .  thiamine (VITAMIN B-1) tablet 100 mg, 100 mg, Oral, Daily, 100 mg at 05/19/16 1039 **OR** thiamine (B-1) injection 100 mg, 100 mg, Intravenous, Daily, Black, Karen M, NP .  traZODone (DESYREL) tablet 50 mg, 50 mg, Oral, QHS, Black, Karen M, NP, 50 mg at 05/18/16 2256:  Allergies  Allergen Reactions  . Nifedipine Swelling    REACTION: SWELLING, 11/02/2011 pt denies this allergy.  :  Family History  Problem Relation Age of Onset  . Heart disease Father   . Diabetes Father   . Stroke Father   . Depression Mother   . Heart disease Brother   . Alcohol abuse Brother   . Liver disease Brother   :  Social History   Social History  . Marital status: Married    Spouse name: N/A  . Number of children: 3  . 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 Smoking status: Former Smoker    Types: Pipe  . Smokeless tobacco: Never Used     Comment: 05/17/2016 "stopped in the late 1990s"  . Alcohol use 67.2 oz/week    112 Glasses of wine per week     Comment: 11/02/2011 "large box of wine q 2 days" 01-27-15 2 glass a day of wine; 05/17/2016 "1.75L bottles; 1- 1 1/2 bottles/day; somedays worse than others; at least 3-4 days/week he'll have 1 1/2 bottles; last drink was 05/16/2016" (05/17/2016)  . Drug use: No  . Sexual activity: No   Other Topics Concern  . Not on file   Social History Narrative   Married (remarried), lives with wife; 3 children, 1 at home.    [employer] - mixes colors       Pt revised designated party release form Wilkins Elpers, wife [phone] - can leave msg.     Allen Norris 03/04/10.        Would desire CPR.  Would not want prolonged life support if futile.  :  Pertinent items are noted in HPI.  Exam: Blood pressure 104/76, pulse 100, temperature 98 F (36.7 C), temperature source Oral, resp. rate 16, height 5' 9"  (1.753 m), weight 188 lb 15 oz (85.7 kg), SpO2 99 %. General appearance: alert and cooperative Throat: lips, mucosa, and tongue normal; teeth and gums normal Neck: no adenopathy Back: negative Resp: clear to auscultation bilaterally Chest wall: no tenderness Cardio: regular rate and rhythm, S1, S2 normal, no murmur, click, rub or gallop GI: soft, non-tender; bowel sounds normal; no masses,  no organomegaly Extremities: extremities normal, atraumatic, no cyanosis or edema Skin: Skin color, texture, turgor normal. No rashes or lesions Lymph nodes: Cervical, supraclavicular, and axillary nodes normal.   Recent Labs  05/18/16 0725 05/18/16 1820 05/19/16 0506  WBC 7.6  --  11.9*  HGB 6.0* 8.0* 7.7*  HCT 16.6* 22.9* 21.3*  PLT 134*  --  125*    Recent Labs  05/18/16 0451 05/19/16 0506  NA 124* 125*  K 5.1 3.9  CL 93* 92*  CO2 22 24  GLUCOSE 99 124*  BUN 39* 30*  CREATININE 0.87 0.92  CALCIUM 7.9* 7.8*     Blood smear review: Was personally reviewed today and did not show any evidence of red cell fragments, schistocytes or polychromasia. Could not appreciate any spherocytes   Ct Head Wo Contrast  Result Date: 05/17/2016 CLINICAL DATA:  Status post fall. Unknown if the loss consciousness. Syncope. EXAM: CT HEAD WITHOUT CONTRAST CT CERVICAL SPINE WITHOUT CONTRAST TECHNIQUE: Multidetector CT imaging of the head and cervical spine was performed following the standard protocol without intravenous contrast. Multiplanar CT image reconstructions of the cervical spine were also generated. COMPARISON:  03/05/2016 FINDINGS: CT HEAD FINDINGS Brain: No evidence of acute infarction,  hemorrhage, extra-axial collection, ventriculomegaly, or mass effect. Generalized cerebral atrophy. Periventricular white matter low attenuation likely secondary to  microangiopathy. Vascular: Cerebrovascular atherosclerotic calcifications are noted. Skull: Negative for fracture or focal lesion. Sinuses/Orbits: Visualized portions of the orbits are unremarkable. Visualized portions of the paranasal sinuses and mastoid air cells are unremarkable. Other: None. CT CERVICAL SPINE FINDINGS Alignment: Normal. Skull base and vertebrae: No acute fracture. No primary bone lesion or focal pathologic process. Ununited type 1 fracture of the odontoid process with posterior fusion of C2-3 with bilateral pedicle screws and a cerclage wire. Soft tissues and spinal canal: No prevertebral fluid or swelling. No visible canal hematoma. Disc levels: Degenerative disc disease with mild disc height loss at C5-6. Posterior spinal fusion at C2-3 with moderate left facet arthropathy and left foraminal stenosis. No hardware failure or complication. Moderate bilateral facet arthropathy at C3-4 and C4-5. Bilateral foraminal narrowing at C4-5. Broad-based disc osteophyte complex at C5-6 with bilateral uncovertebral degenerative changes and moderate bilateral foraminal stenosis. Other: No fluid collection or hematoma. Bilateral carotid artery atherosclerosis. IMPRESSION: 1. No acute intracranial pathology. 2. No acute osseous injury the cervical spine. 3. Cervical spine spondylosis as described above. 4. Chronic ununited type 1 fracture of the odontoid process with posterior fusion at C2-3 without failure or complication. Electronically Signed   By: Kathreen Devoid   On: 05/17/2016 11:18   Ct Chest Wo Contrast  Result Date: 05/17/2016 CLINICAL DATA:  Fall, syncope EXAM: CT CHEST WITHOUT CONTRAST TECHNIQUE: Multidetector CT imaging of the chest was performed following the standard protocol without IV contrast. COMPARISON:  Chest CT 03/05/2016.   Chest x-ray earlier today. FINDINGS: Cardiovascular: Heart is borderline in size. Extensive coronary artery and aortic calcifications. No evidence of aortic aneurysm. Mediastinum/Nodes: No mediastinal, hilar, or axillary adenopathy. Trachea and esophagus are unremarkable. Lungs/Pleura: No confluent airspace opacities or pleural effusions. No pneumothorax. Upper Abdomen: Layering gallstones within the gallbladder. No acute findings. Musculoskeletal: Bilateral gynecomastia. Multiple left rib fractures are noted involving the left second through eighth ribs. The second and third rib fractures are comminuted. There is no associated pneumothorax. Overlying pleural or extrapleural soft tissue thickening noted in the lateral left apex. Multiple old healed posterior right rib fractures. IMPRESSION: Left second through eighth rib fractures posterolaterally. No associated left effusion or pneumothorax. Cardiomegaly. Coronary artery disease, aortic atherosclerosis. Bilateral gynecomastia. Electronically Signed   By: Rolm Baptise M.D.   On: 05/17/2016 11:11   Ct Cervical Spine Wo Contrast  Result Date: 05/17/2016 CLINICAL DATA:  Status post fall. Unknown if the loss consciousness. Syncope. EXAM: CT HEAD WITHOUT CONTRAST CT CERVICAL SPINE WITHOUT CONTRAST TECHNIQUE: Multidetector CT imaging of the head and cervical spine was performed following the standard protocol without intravenous contrast. Multiplanar CT image reconstructions of the cervical spine were also generated. COMPARISON:  03/05/2016 FINDINGS: CT HEAD FINDINGS Brain: No evidence of acute infarction, hemorrhage, extra-axial collection, ventriculomegaly, or mass effect. Generalized cerebral atrophy. Periventricular white matter low attenuation likely secondary to microangiopathy. Vascular: Cerebrovascular atherosclerotic calcifications are noted. Skull: Negative for fracture or focal lesion. Sinuses/Orbits: Visualized portions of the orbits are unremarkable.  Visualized portions of the paranasal sinuses and mastoid air cells are unremarkable. Other: None. CT CERVICAL SPINE FINDINGS Alignment: Normal. Skull base and vertebrae: No acute fracture. No primary bone lesion or focal pathologic process. Ununited type 1 fracture of the odontoid process with posterior fusion of C2-3 with bilateral pedicle screws and a cerclage wire. Soft tissues and spinal canal: No prevertebral fluid or swelling. No visible canal hematoma. Disc levels: Degenerative disc disease with mild disc height loss at C5-6. Posterior spinal fusion at  C2-3 with moderate left facet arthropathy and left foraminal stenosis. No hardware failure or complication. Moderate bilateral facet arthropathy at C3-4 and C4-5. Bilateral foraminal narrowing at C4-5. Broad-based disc osteophyte complex at C5-6 with bilateral uncovertebral degenerative changes and moderate bilateral foraminal stenosis. Other: No fluid collection or hematoma. Bilateral carotid artery atherosclerosis. IMPRESSION: 1. No acute intracranial pathology. 2. No acute osseous injury the cervical spine. 3. Cervical spine spondylosis as described above. 4. Chronic ununited type 1 fracture of the odontoid process with posterior fusion at C2-3 without failure or complication. Electronically Signed   By: Kathreen Devoid   On: 05/17/2016 11:18   Ct Abdomen Pelvis W Contrast  Result Date: 05/18/2016 CLINICAL DATA:  Anemia. EXAM: CT ABDOMEN AND PELVIS WITH CONTRAST TECHNIQUE: Multidetector CT imaging of the abdomen and pelvis was performed using the standard protocol following bolus administration of intravenous contrast. CONTRAST:  136m ISOVUE-300 IOPAMIDOL (ISOVUE-300) INJECTION 61% COMPARISON:  Abdominal CT 06/23/2009.  Chest CT yesterday. FINDINGS: Lower chest: Small left pleural effusion and adjacent atelectasis, new from chest CT yesterday. Mild cardiomegaly. Hepatobiliary: Mild diffuse decreased hepatic density consistent with steatosis. No focal  hepatic lesion. Gallbladder is distended with layering hyperdensity, possibly sludge or small stones. No CT findings of wall thickening or pericholecystic inflammation. No biliary dilatation. Pancreas: No ductal dilatation or inflammation. Spleen: Normal in size without focal abnormality. Adrenals/Urinary Tract: No adrenal nodule. No hydronephrosis. Duplicated left renal collecting system. There is mild right renal atrophy. Symmetric enhancement and excretion on delayed phase imaging. Mild symmetric nonspecific perinephric edema appears similar to prior exam. Small exophytic cyst from the lower left kidney. Urinary bladder is physiologically distended, no wall thickening. Stomach/Bowel: Stomach physiologically distended. No evidence of bowel wall thickening, distention or inflammation. Moderate stool burden with tortuosity of the hepatic flexure. Small to moderate diverticulosis of the distal descending and sigmoid colon without acute inflammation. Normal appendix. Vascular/Lymphatic: Aortic atherosclerosis without aneurysm. No abdominal or pelvic adenopathy. Reproductive: Prostate is unremarkable. Other: Soft tissue edema about the left lateral abdominal wall contains small foci of air, may be secondary to IV placement. Tiny air within the right inferior epigastric vessels likely secondary to IV placement. Small to moderate-sized fat containing umbilical hernia. There is fat in the left inguinal canal. Mild dependent body wall edema. No ascites. Musculoskeletal: Acute posterior left rib fractures as characterized on recent chest CT. Mild compression deformity superior endplate of the L1, appears remote. Mild chronic change about both hips. IMPRESSION: 1. Colonic diverticulosis without acute inflammation. 2. Moderate stool burden with colonic tortuosity, suggesting constipation. 3. Hepatic steatosis. 4. Gallbladder distention containing narrowing high density, likely combination of small stones and sludge. No  pericholecystic inflammation or wall thickening. 5. Fat containing umbilical hernia. 6. Small left pleural effusion and adjacent atelectasis, new from chest CT yesterday. Acute left rib fractures are partially included. 7. Aortic atherosclerosis. Electronically Signed   By: MJeb LeveringM.D.   On: 05/18/2016 23:20    Assessment and Plan:   71year old gentleman with the following issues:  1. Anemia that is normocytic and normochromic. This was detected on admission on 05/17/2016 after presenting with a hemoglobin of 8.2 drifted down to 6.0. He has a normal differential and normal peripheral smear. His previous hemoglobin has been normal in November 2017.  The differential diagnosis was reviewed today with the patient and his wife.His anemia is multifactorial related to heavy alcohol use which can cause bone marrow suppression, poor nutritional intake and possibly occult GI blood losses that is  not obvious at this time. Although he is Hemoccult testing is negative, the wife does report dark stool at times which could indicate transient blood loss. His iron level was normal at 142 with a ferritin of 421. His folate level was normal at 15.1.  I see no clear-cut evidence to suggest hemolysis with a normal reticulocyte count and very mildly elevated bilirubin.  From management standpoint, I agree with supportive transfusions at this time as you are doing. I will obtain haptoglobin, Coombs testing to evaluate completely for hemolysis. I doubt he has a plasma cell disorder such as multiple myeloma given his normal CBC and electrolytes close to 6 months ago.  If his anemia persists in the future outpatient hematology workup may be needed. Imaging studies including CT scan chest abdomen and pelvis as well as CT of the head and the cervical spine were reviewed and did not suggest any malignancy.  2. Alcohol abuse: Could be contributing to his anemia as well as his syncope and fall. Decreasing local intake  will certainly help his health overall.  I will follow his labs in the next 24-48 hours and add any additional recommendation at that time.

## 2016-05-19 NOTE — Progress Notes (Signed)
RN, Malachy Mood, paged because pt was c/o CP. During the night, pt has been c/o heartburn and nausea unrelieved by several meds. He is also on several pain meds for "chronic pain" and has multiple left rib fxs from a recent fall (syncope episode). 12 lead ordered and NTG x 1 and NP to bedside. S: Pt states his chest pain is "sharp" located at the left lower rib cage/sternal wall. Unrelieved by NTG x 1. No SOB, diaphoresis, jaw or arm pain. + nausea. States he has never had a heart attack and had a "normal" cardiac cath one year ago. BM 1-2 days ago. + passing gas. O: Pt was sleeping when NP arrived. Doses off during conversation. Oriented. Vague historian.  VSS. Card: S1S2 RRR. Pain is not reproducible with palpation of central or lower sternum.  Resp: normal effort. RR normal. O2 sat normal. Abd: mild tenderness over epigastric area with palpation.  A/P: atypical CP-believe this to be associated with epigastric sx and/or rib fx. NTG x 1 no help. Giving GI cocktail now and told RN to reassess in 5 mins. Ordered troponins. 12 lead EKG without acute changes. NP chose not to give ASA due to anemia/? GIB and low suspicion for ACS.  NP called Dr. Carolin Sicks, new attending, and related above. He will f/up and see pt.  KJKG, NP [facility]

## 2016-05-20 ENCOUNTER — Inpatient Hospital Stay (HOSPITAL_COMMUNITY): Payer: Medicare Other

## 2016-05-20 ENCOUNTER — Other Ambulatory Visit (HOSPITAL_COMMUNITY): Payer: Medicare Other

## 2016-05-20 DIAGNOSIS — L120 Bullous pemphigoid: Secondary | ICD-10-CM

## 2016-05-20 DIAGNOSIS — I10 Essential (primary) hypertension: Secondary | ICD-10-CM

## 2016-05-20 LAB — CBC
HEMATOCRIT: 18.9 % — AB (ref 39.0–52.0)
Hemoglobin: 6.5 g/dL — CL (ref 13.0–17.0)
MCH: 30.2 pg (ref 26.0–34.0)
MCHC: 34.4 g/dL (ref 30.0–36.0)
MCV: 87.9 fL (ref 78.0–100.0)
Platelets: 128 10*3/uL — ABNORMAL LOW (ref 150–400)
RBC: 2.15 MIL/uL — ABNORMAL LOW (ref 4.22–5.81)
RDW: 14.8 % (ref 11.5–15.5)
WBC: 9.4 10*3/uL (ref 4.0–10.5)

## 2016-05-20 LAB — RENAL FUNCTION PANEL
ALBUMIN: 2.7 g/dL — AB (ref 3.5–5.0)
ANION GAP: 9 (ref 5–15)
BUN: 25 mg/dL — ABNORMAL HIGH (ref 6–20)
CALCIUM: 8.2 mg/dL — AB (ref 8.9–10.3)
CO2: 24 mmol/L (ref 22–32)
CREATININE: 0.83 mg/dL (ref 0.61–1.24)
Chloride: 93 mmol/L — ABNORMAL LOW (ref 101–111)
Glucose, Bld: 105 mg/dL — ABNORMAL HIGH (ref 65–99)
PHOSPHORUS: 2.6 mg/dL (ref 2.5–4.6)
Potassium: 3.9 mmol/L (ref 3.5–5.1)
SODIUM: 126 mmol/L — AB (ref 135–145)

## 2016-05-20 LAB — GLUCOSE, CAPILLARY: Glucose-Capillary: 110 mg/dL — ABNORMAL HIGH (ref 65–99)

## 2016-05-20 LAB — AMMONIA: Ammonia: 18 umol/L (ref 9–35)

## 2016-05-20 LAB — DIRECT ANTIGLOBULIN TEST (NOT AT ARMC)
DAT, IgG: NEGATIVE
DAT, complement: NEGATIVE

## 2016-05-20 LAB — PREPARE RBC (CROSSMATCH)

## 2016-05-20 MED ORDER — SODIUM CHLORIDE 0.9 % IV SOLN
Freq: Once | INTRAVENOUS | Status: AC
  Administered 2016-05-20: 07:00:00 via INTRAVENOUS

## 2016-05-20 MED ORDER — POTASSIUM CHLORIDE CRYS ER 20 MEQ PO TBCR
30.0000 meq | EXTENDED_RELEASE_TABLET | Freq: Every day | ORAL | Status: DC
  Administered 2016-05-20 – 2016-05-22 (×3): 30 meq via ORAL
  Filled 2016-05-20 (×3): qty 1

## 2016-05-20 NOTE — Progress Notes (Signed)
Labs in the last 12 hours reviewed today. His Coombs testing is negative which rules out immune hemolysis. Nonimmune hemolysis is considered less likely with no evidence of red cell fragments or schistocytes on the smear.  Recommend continuing supportive care with transfusion as needed. Would continue to monitor signs or symptoms of GI/GU bleeding. I see no hematological explanation for his anemia. I see no evidence to suggest a bone marrow disorder such as MDS or aplastic anemia.

## 2016-05-20 NOTE — Consult Note (Signed)
[facility] CM Primary Care Navigator  05/20/2016  [name] [facility] 1945/01/13 [id number]    Went to seepatientat the bedside to identify possible discharge needs and met with wife [name]).  Wife reports that patient was having dizziness, nausea, weakness that had led to this admission. Patient endorses [facility] as the primary care provider.   Patient's wife shared using [place] representative at [employer] and [place] service to obtain medications without difficulty.   Patient manages his own medications at home with minimal assistance from wife.  Wife states that he is able to drive to hisdoctors'appointments prior to admission. Wife or brother in-law [name]) provides transportation when schedule permits.  Wife voiced that transportation may be a concern at times. [facility] list of transportation resources provided for herto use for any transportation needs.  Patient's wife works full time and she reports that patient stays at home by himself with no help when she is at work. [facility] list of personal care services was provided as aresource whenever needed, with the understanding that theywill pay out of the pocket for it.She wasgrateful for the resource lists provided.  Anticipated discharge plan is skilled nursing facility to maximize independence and safety with mobility prior to returning back home.  Patient's wife expressedunderstanding to call primary care provider's officewhen he returns back home,for a post discharge follow-up appointment within a week or sooner if needed.Patient letter (with PCP's contact number) was provided as a reminder.  Wife communicated no other further health management needs at this time. [facility] care management contact information provided for future needs that may arise.  For questions, please contact:  Dannielle Huh, BSN, RN- [facility] Primary Care Navigator  Telephone:  [phone] [place]

## 2016-05-20 NOTE — NC FL2 (Signed)
[place] LEVEL OF CARE SCREENING TOOL     IDENTIFICATION  Patient Name: [name] Birthdate: 11-02-1945 Sex: male Admission Date (Current Location): 05/17/2016  [facility] and [place] Number:  Herbalist and Address:  The [place]. [facility], [place] [address] [place], [place] [address]      Provider Number: [id number]  Attending Physician Name and Address:  Rosita Fire, MD  Relative Name and Phone Number:  Venus, Ruhe - [phone] (work), [phone] (mobile)     Current Level of Care: Hospital Recommended Level of Care: [place] Prior Approval Number:    Date Approved/Denied:   PASRR Number: [id number] (Eff. 11/08/11)  Discharge Plan: SNF    Current Diagnoses: Patient Active Problem List   Diagnosis Date Noted  . Anemia 05/18/2016  . Syncope and collapse   . Syncope 05/17/2016  . Cellulitis 05/17/2016  . Rib fractures 05/17/2016  . Bullous pemphigoid 05/17/2016  . ETOH abuse 03/17/2016  . Dizziness and giddiness 12/09/2015  . Chronic pain syndrome 12/09/2015  . Arthritis 11/20/2015  . Mild CAD   . DCM (dilated cardiomyopathy) ([place])   . Abnormal nuclear stress test 05/07/2015  . PAC (premature atrial contraction) 04/07/2015  . Atrial tachycardia, paroxysmal ([place]) 03/10/2015  . Depression 04/12/2012  . C2 cervical fracture ([place]) 02/29/2012  . Fracture of humerus, proximal, left, closed 11/02/2011  . Hyponatremia 11/17/2010  . H/O: upper GI bleed 10/20/2010  . Stomach ulcer from aspirin/ibuprofen-like drugs (NSAID's) 10/20/2010  . Colon polyp 08/26/2010  . Diverticulosis of colon (without mention of hemorrhage) 08/26/2010  . BPH (benign prostatic hyperplasia) 07/02/2010  . Deficiency anemia 03/09/2010  . Hyposmolality and/or hyponatremia 10/17/2009  . ALCOHOLIC CIRRHOSIS OF LIVER 07/25/2009  . Anxiety state 01/24/2007  . HLD (hyperlipidemia) 07/28/2006  . Alcohol abuse 07/28/2006  .  Essential hypertension 07/28/2006  . IMPOTENCE, ORGANIC ORIGIN 07/28/2006    Orientation RESPIRATION BLADDER Height & Weight     Self  Normal Continent (External urinary catheter placed 5/8) Weight: 195 lb 5.2 oz (88.6 kg) Height:  5\' 9"  (175.3 cm)  BEHAVIORAL SYMPTOMS/MOOD NEUROLOGICAL BOWEL NUTRITION STATUS      Continent Diet (Heart healthy)  AMBULATORY STATUS COMMUNICATION OF NEEDS Skin   Extensive Assist Verbally Other (Comment) (Blister left foot; Ecchymosis right/left arm and hip;  Skin tear left elbow with gauze dressing)                       Personal Care Assistance Level of Assistance  Bathing, Feeding, Dressing Bathing Assistance: Maximum assistance Feeding assistance: Independent Dressing Assistance: Maximum assistance     Functional Limitations Info  Sight, Hearing, Speech Sight Info: Adequate (Hx of cataract) Hearing Info: Adequate Speech Info: Adequate    SPECIAL CARE FACTORS FREQUENCY  PT (By licensed PT)     PT Frequency: Evaluated 5/9 and a minimum of 3x per week therapy recommended              Contractures Contractures Info: Not present    Additional Factors Info  Code Status, Allergies Code Status Info: Full Allergies Info: Nifedipine           Current Medications (05/20/2016):  This is the current hospital active medication list Current Facility-Administered Medications  Medication Dose Route Frequency Provider Last Rate Last Dose  . 0.9 %  sodium chloride infusion   Intravenous Once Eugenie Filler, MD      . 0.9 %  sodium chloride infusion  Intravenous Once Dionne Milo, NP      . B-complex with vitamin C tablet 1 tablet  1 tablet Oral Daily Waldemar Dickens, MD   1 tablet at 05/20/16 [phone]  . Chlorhexidine Gluconate Cloth 2 % PADS 6 each  6 each Topical Q0600 Waldemar Dickens, MD   6 each at 05/18/16 0530  . doxycycline (VIBRA-TABS) tablet 100 mg  100 mg Oral BID Waldemar Dickens, MD   100 mg at 05/20/16 0941  .  famotidine (PEPCID) tablet 20 mg  20 mg Oral Daily PRN Gardiner Barefoot, NP   20 mg at 05/19/16 2142  . feeding supplement (ENSURE ENLIVE) (ENSURE ENLIVE) liquid 237 mL  237 mL Oral BID BM Waldemar Dickens, MD   237 mL at 05/18/16 1500  . folic acid (FOLVITE) tablet 1 mg  1 mg Oral Daily Radene Gunning, NP   1 mg at 05/20/16 0941  . furosemide (LASIX) injection 20 mg  20 mg Intravenous Daily Rosita Fire, MD   20 mg at 05/20/16 0941  . loratadine (CLARITIN) tablet 10 mg  10 mg Oral Daily Radene Gunning, NP   10 mg at 05/20/16 0941  . LORazepam (ATIVAN) tablet 1 mg  1 mg Oral Q6H PRN Radene Gunning, NP   1 mg at 05/19/16 0511   Or  . LORazepam (ATIVAN) injection 1 mg  1 mg Intravenous Q6H PRN Radene Gunning, NP      . metoprolol succinate (TOPROL-XL) 24 hr tablet 25 mg  25 mg Oral Daily Radene Gunning, NP   25 mg at 05/20/16 0941  . mupirocin ointment (BACTROBAN) 2 % 1 application  1 application Nasal BID Waldemar Dickens, MD   1 application at 16/07/37 [phone]  . nitroGLYCERIN (NITROSTAT) SL tablet 0.4 mg  0.4 mg Sublingual Q5 min PRN Kirby-Graham, Karsten Fells, NP      . ondansetron [facility]) injection 4 mg  4 mg Intravenous Q4H PRN Radene Gunning, NP   4 mg at 05/19/16 0248  . oxyCODONE (Oxy IR/ROXICODONE) immediate release tablet 5 mg  5 mg Oral Q6H PRN Radene Gunning, NP   5 mg at 05/19/16 2143  . pantoprazole (PROTONIX) EC tablet 40 mg  40 mg Oral Q0600 Vena Rua, PA-C   40 mg at 05/20/16 0636  . prochlorperazine (COMPAZINE) injection 10 mg  10 mg Intravenous Q6H PRN Eugenie Filler, MD   10 mg at 05/18/16 1830  . QUEtiapine (SEROQUEL) tablet 300 mg  300 mg Oral QHS Radene Gunning, NP   300 mg at 05/19/16 2141  . sodium chloride flush (NS) 0.9 % injection 3 mL  3 mL Intravenous Q12H Radene Gunning, NP   3 mL at 05/20/16 0943  . thiamine (VITAMIN B-1) tablet 100 mg  100 mg Oral Daily Radene Gunning, NP   100 mg at 05/20/16 6948  . traZODone (DESYREL) tablet 50 mg  50 mg Oral QHS  Radene Gunning, NP   50 mg at 05/19/16 2141     Discharge Medications: Please see discharge summary for a list of discharge medications.  Relevant Imaging Results:  Relevant Lab Results:   Additional Information ss#[id number].   Sable Feil, LCSW

## 2016-05-20 NOTE — Progress Notes (Signed)
PROGRESS NOTE    [name]  ALP:[id number] DOB: 02/06/1945 DOA: 05/17/2016 PCP: Lucille Passy, MD   Brief Narrative: 71 year old male with history of alcohol abuse, memory loss, PPS, frequent falls presented after a fall and sustaining ribs fracture. On admission patient was found to have a drop in hemoglobin and possible syncopal episode with significant hyponatremia.  Assessment & Plan:  # Symptomatic anemia: Of unknown etiology -Patient has no sign of bleeding. Evaluated by GI, recommended no further evaluation this time. Vitamin D53, folic acid level, iron stores acceptable. Patient received red blood cell transfusion during this hospitalization. CT scan of abdomen with no bleeding.  -Acute drop in hemoglobin today. No sign of bleeding. Patient has no bowel movement. Planning to send occult blood test when patient has bowel movement. I discussed this with the patient and his wife at bedside. Evaluated by hematologist, workup has been negative so far. Recommended to monitor CBC and transfuse as needed. I obtained a chest x-ray today to see if patient has bleeding from the rib fracture. Chest x-ray no significant change. Plan to transfuse 2 units of red blood cell today. Continue to monitor CBC.  #Syncope versus presyncopal episode leading to fall with rips fracture: CT head unremarkable. Echo still pending. Monitor electrolytes, blood pressure and provide supportive care.  #Multiple left-sided ribs fracture: Continue supportive care with pain management and incentive spirometer.  # Pain management: Continue oral pain medication. Continue supportive care.  #Hyponatremia likely in the setting of alcohol abuse: Patient with lower extremity edema. No improvement in serum sodium level with normal saline IV. I continue IV Lasix and monitor BMP. -start oral KCL. -check mg level -check urine Na, osmolality  #Left-sided chest pain due to rib fracture. EKG with no acute change. Troponin  negative. Echo pending.  #Alcohol abuse: Continue CIWA protocol. Currently on Ativan as needed.  #History of bullous pemphigus left foot: Currently on oral antibiotics and tapering dose of steroid. As per medical record patient was on 4 days of oral antibiotics before admission.  # Thrombocytopenia: Unknown if it is related with alcohol and liver disease. No cirrhosis mentioned in CT scan of abdomen and pelvis. I discontinued Lovenox subcutaneous. Monitor CBC.  #Physical deconditioning: Discussed with the patient's wife at bedside. May need skilled nursing facility. PT OT evaluation ongoing. Discussed with the Education officer, museum.  Principal Problem:   Syncope Active Problems:   HLD (hyperlipidemia)   Anxiety state   Alcohol abuse   Essential hypertension   BPH (benign prostatic hyperplasia)   Hyponatremia   Chronic pain syndrome   Cellulitis   Rib fractures   Bullous pemphigoid   Symptomatic anemia   Syncope and collapse  DVT prophylaxis: SCD. Discontinue Lovenox because of thrombocytopenia and anemia Code Status: Full code Family Communication: Discussed with the patient's wife at bedside Disposition Plan: Likely discharge to SNF in 1-2 days  Consultants:   GI  Hematology  Procedures:  CT chest 05/17/2016  CT head and CT C-spine 05/17/2016  Chest x-ray 05/17/2016  2 units packed red blood cells 05/18/2016 Antimicrobials: doxy Subjective: Patient was seen and examined at bedside. Left-sided chest pain is better today. Denied headache, dizziness, nausea, vomiting. Looks more alert. Wife at bedside.  Objective: Vitals:   05/20/16 0441 05/20/16 1039 05/20/16 1145 05/20/16 1218  BP: 102/77 121/79 116/75 117/61  Pulse: 91 79 74 71  Resp: 17 16 16 16   Temp: 97.2 F (36.2 C) 98.6 F (37 C) 97.6 F (36.4 C) 97.8 F (  36.6 C)  TempSrc: Oral Oral Oral Oral  SpO2: 100% 100% 100% 100%  Weight:      Height:        Intake/Output Summary (Last 24 hours) at 05/20/16  1346 Last data filed at 05/20/16 1243  Gross per 24 hour  Intake              363 ml  Output             1375 ml  Net            -1012 ml   Filed Weights   05/17/16 2001 05/19/16 0428 05/19/16 2100  Weight: 82 kg (180 lb 12.4 oz) 85.7 kg (188 lb 15 oz) 88.6 kg (195 lb 5.2 oz)    Examination:  General exam: Looks more alert and awake. Sitting on chair.  Respiratory system: Clear bilaterally, respiratory effort poor because of left rib pain Cardiovascular system: Regular rate rhythm, S1 and S2 normal  Gastrointestinal system: Abdomen soft, nontender. Bowel sound positive. Central nervous system: Alert awake and following commands Extremities: Bilateral lower extremities pitting edema unchanged Skin: No rashes, lesions or ulcers    Data Reviewed: I have personally reviewed following labs and imaging studies  CBC:  Recent Labs Lab 05/17/16 0934 05/18/16 0725 05/18/16 1820 05/19/16 0506 05/20/16 0446  WBC 17.3* 7.6  --  11.9* 9.4  NEUTROABS 13.5*  --   --  8.8*  --   HGB 8.2* 6.0* 8.0* 7.7* 6.5*  HCT 22.5* 16.6* 22.9* 21.3* 18.9*  MCV 88.2 88.8  --  87.3 87.9  PLT 217 134*  --  125* 841*   Basic Metabolic Panel:  Recent Labs Lab 05/17/16 0934 05/17/16 1449 05/18/16 0451 05/18/16 0950 05/19/16 0506 05/20/16 0857  NA 123* 123* 124*  --  125* 126*  K 4.8 4.3 5.1  --  3.9 3.9  CL 89* 90* 93*  --  92* 93*  CO2 22 25 22   --  24 24  GLUCOSE 114* 116* 99  --  124* 105*  BUN 47* 43* 39*  --  30* 25*  CREATININE 1.04 0.98 0.87  --  0.92 0.83  CALCIUM 9.2 8.6* 7.9*  --  7.8* 8.2*  MG  --   --   --  1.7 2.5*  --   PHOS  --   --   --   --   --  2.6   GFR: Estimated Creatinine Clearance: 92.6 mL/min (by C-G formula based on SCr of 0.83 mg/dL). Liver Function Tests:  Recent Labs Lab 05/17/16 0934 05/18/16 0451 05/19/16 0506 05/20/16 0857  AST 32 34 24  --   ALT 31 24 26   --   ALKPHOS 56 40 54  --   BILITOT 1.1 1.5* 1.5*  --   PROT 5.4* 3.9* 4.5*  --   ALBUMIN  3.2* 2.4* 2.6* 2.7*    Recent Labs Lab 05/17/16 0934  LIPASE 26    Recent Labs Lab 05/20/16 0446  AMMONIA 18   Coagulation Profile:  Recent Labs Lab 05/19/16 0506  INR 1.25   Cardiac Enzymes:  Recent Labs Lab 05/17/16 0934 05/19/16 0743  TROPONINI <0.03 <0.03   BNP (last 3 results) No results for input(s): PROBNP in the last 8760 hours. HbA1C: No results for input(s): HGBA1C in the last 72 hours. CBG:  Recent Labs Lab 05/18/16 0755 05/19/16 0805 05/20/16 0746  GLUCAP 114* 118* 110*   Lipid Profile: No results for input(s): CHOL, HDL, LDLCALC, TRIG,  CHOLHDL, LDLDIRECT in the last 72 hours. Thyroid Function Tests: No results for input(s): TSH, T4TOTAL, FREET4, T3FREE, THYROIDAB in the last 72 hours. Anemia Panel:  Recent Labs  05/17/16 1449  VITAMINB12 6,041*  FOLATE 15.1  FERRITIN 421*  TIBC 154*  IRON 142  RETICCTPCT 2.7   Sepsis Labs: No results for input(s): PROCALCITON, LATICACIDVEN in the last 168 hours.  Recent Results (from the past 240 hour(s))  MRSA PCR Screening     Status: Abnormal   Collection Time: 05/17/16 10:42 PM  Result Value Ref Range Status   MRSA by PCR POSITIVE (A) NEGATIVE Final    Comment:        The GeneXpert MRSA Assay (FDA approved for NASAL specimens only), is one component of a comprehensive MRSA colonization surveillance program. It is not intended to diagnose MRSA infection nor to guide or monitor treatment for MRSA infections. RESULT CALLED TO, READ BACK BY AND VERIFIED WITH: CALLED TO V.BROWN AT 0317 BY L.PITT 05/18/16          Radiology Studies: Dg Chest 2 View  Result Date: 05/20/2016 CLINICAL DATA:  Recurrent falls, pt has a fall from his recliner yesterday with left sided rib pain EXAM: CHEST - 2 VIEW COMPARISON:  05/17/2016 FINDINGS: Multiple left rib fractures as before. Further displacement of the anterolateral fractures of left ribs 4 and 5. Lateral and apical pleural thickening as before  probably related to hematoma. No pneumothorax. Lungs are clear. Heart size upper limits normal.  Tortuous thoracic aorta. Small left pleural effusion. Compression deformity in the upper lumbar spine as before. Fixation hardware in the proximal left humerus, incompletely visualized. IMPRESSION: 1. Left rib fractures and upper lumbar compression deformity. 2. Small left pleural effusion.  No pneumothorax. Electronically Signed   By: Lucrezia Europe M.D.   On: 05/20/2016 09:42   Ct Abdomen Pelvis W Contrast  Result Date: 05/18/2016 CLINICAL DATA:  Anemia. EXAM: CT ABDOMEN AND PELVIS WITH CONTRAST TECHNIQUE: Multidetector CT imaging of the abdomen and pelvis was performed using the standard protocol following bolus administration of intravenous contrast. CONTRAST:  170mL ISOVUE-300 IOPAMIDOL (ISOVUE-300) INJECTION 61% COMPARISON:  Abdominal CT 06/23/2009.  Chest CT yesterday. FINDINGS: Lower chest: Small left pleural effusion and adjacent atelectasis, new from chest CT yesterday. Mild cardiomegaly. Hepatobiliary: Mild diffuse decreased hepatic density consistent with steatosis. No focal hepatic lesion. Gallbladder is distended with layering hyperdensity, possibly sludge or small stones. No CT findings of wall thickening or pericholecystic inflammation. No biliary dilatation. Pancreas: No ductal dilatation or inflammation. Spleen: Normal in size without focal abnormality. Adrenals/Urinary Tract: No adrenal nodule. No hydronephrosis. Duplicated left renal collecting system. There is mild right renal atrophy. Symmetric enhancement and excretion on delayed phase imaging. Mild symmetric nonspecific perinephric edema appears similar to prior exam. Small exophytic cyst from the lower left kidney. Urinary bladder is physiologically distended, no wall thickening. Stomach/Bowel: Stomach physiologically distended. No evidence of bowel wall thickening, distention or inflammation. Moderate stool burden with tortuosity of the hepatic  flexure. Small to moderate diverticulosis of the distal descending and sigmoid colon without acute inflammation. Normal appendix. Vascular/Lymphatic: Aortic atherosclerosis without aneurysm. No abdominal or pelvic adenopathy. Reproductive: Prostate is unremarkable. Other: Soft tissue edema about the left lateral abdominal wall contains small foci of air, may be secondary to IV placement. Tiny air within the right inferior epigastric vessels likely secondary to IV placement. Small to moderate-sized fat containing umbilical hernia. There is fat in the left inguinal canal. Mild dependent body wall edema. No  ascites. Musculoskeletal: Acute posterior left rib fractures as characterized on recent chest CT. Mild compression deformity superior endplate of the L1, appears remote. Mild chronic change about both hips. IMPRESSION: 1. Colonic diverticulosis without acute inflammation. 2. Moderate stool burden with colonic tortuosity, suggesting constipation. 3. Hepatic steatosis. 4. Gallbladder distention containing narrowing high density, likely combination of small stones and sludge. No pericholecystic inflammation or wall thickening. 5. Fat containing umbilical hernia. 6. Small left pleural effusion and adjacent atelectasis, new from chest CT yesterday. Acute left rib fractures are partially included. 7. Aortic atherosclerosis. Electronically Signed   By: Jeb Levering M.D.   On: 05/18/2016 23:20        Scheduled Meds: . B-complex with vitamin C  1 tablet Oral Daily  . Chlorhexidine Gluconate Cloth  6 each Topical Q0600  . doxycycline  100 mg Oral BID  . feeding supplement (ENSURE ENLIVE)  237 mL Oral BID BM  . folic acid  1 mg Oral Daily  . furosemide  20 mg Intravenous Daily  . loratadine  10 mg Oral Daily  . metoprolol succinate  25 mg Oral Daily  . mupirocin ointment  1 application Nasal BID  . pantoprazole  40 mg Oral Q0600  . QUEtiapine  300 mg Oral QHS  . sodium chloride flush  3 mL Intravenous  Q12H  . thiamine  100 mg Oral Daily  . traZODone  50 mg Oral QHS   Continuous Infusions: . sodium chloride    . sodium chloride       LOS: 2 days    Idaliz Tinkle Tanna Furry, MD [facility] Pager [phone]  If 7PM-7AM, please contact night-coverage [email or web address] Password [email or web address] 05/20/2016, 1:46 PM

## 2016-05-20 NOTE — Progress Notes (Signed)
PT Cancellation Note  Patient Details Name: [name] MRN: [id number] DOB: 11-Jun-1945   Cancelled Treatment:    Reason Eval/Treat Not Completed: Medical issues which prohibited therapy.  Pt is awaiting a transfusion and Hgb is 6.5.  Will check later as time and pt allow.   Ramond Dial 05/20/2016, 9:00 AM   Mee Hives, PT MS Acute Rehab Dept. Number: [place] [facility] [place]

## 2016-05-21 ENCOUNTER — Inpatient Hospital Stay (HOSPITAL_COMMUNITY): Payer: Medicare Other

## 2016-05-21 DIAGNOSIS — R55 Syncope and collapse: Secondary | ICD-10-CM

## 2016-05-21 LAB — BPAM RBC
BLOOD PRODUCT EXPIRATION DATE: 201805152359
BLOOD PRODUCT EXPIRATION DATE: 201805152359
BLOOD PRODUCT EXPIRATION DATE: 201805152359
Blood Product Expiration Date: 201805282359
ISSUE DATE / TIME: 201805081110
ISSUE DATE / TIME: 201805081413
ISSUE DATE / TIME: 201805101449
ISSUE DATE / TIME: 201805101145
UNIT TYPE AND RH: 6200
UNIT TYPE AND RH: 6200
Unit Type and Rh: 6200
Unit Type and Rh: 6200

## 2016-05-21 LAB — RENAL FUNCTION PANEL
ALBUMIN: 2.3 g/dL — AB (ref 3.5–5.0)
Anion gap: 5 (ref 5–15)
BUN: 18 mg/dL (ref 6–20)
CO2: 27 mmol/L (ref 22–32)
CREATININE: 0.81 mg/dL (ref 0.61–1.24)
Calcium: 7.9 mg/dL — ABNORMAL LOW (ref 8.9–10.3)
Chloride: 96 mmol/L — ABNORMAL LOW (ref 101–111)
GFR calc Af Amer: >60 mL/min (ref >60)
Glucose, Bld: 82 mg/dL (ref 65–99)
PHOSPHORUS: 2.3 mg/dL — AB (ref 2.5–4.6)
POTASSIUM: 3.4 mmol/L — AB (ref 3.5–5.1)
SODIUM: 128 mmol/L — AB (ref 135–145)

## 2016-05-21 LAB — TYPE AND SCREEN
ABO/RH(D): A POS
ANTIBODY SCREEN: NEGATIVE
UNIT DIVISION: 0
Unit division: 0
Unit division: 0
Unit division: 0

## 2016-05-21 LAB — CBC
HCT: 22.4 % — ABNORMAL LOW (ref 39.0–52.0)
Hemoglobin: 7.6 g/dL — ABNORMAL LOW (ref 13.0–17.0)
MCH: 29.8 pg (ref 26.0–34.0)
MCHC: 33.9 g/dL (ref 30.0–36.0)
MCV: 87.8 fL (ref 78.0–100.0)
PLATELETS: 138 10*3/uL — AB (ref 150–400)
RBC: 2.55 MIL/uL — AB (ref 4.22–5.81)
RDW: 15.1 % (ref 11.5–15.5)
WBC: 6.6 10*3/uL (ref 4.0–10.5)

## 2016-05-21 LAB — ECHOCARDIOGRAM COMPLETE
HEIGHTINCHES: 69 in
Weight: 3056 oz

## 2016-05-21 LAB — MAGNESIUM: MAGNESIUM: 1.8 mg/dL (ref 1.7–2.4)

## 2016-05-21 LAB — HAPTOGLOBIN: Haptoglobin: 107 mg/dL (ref 34–200)

## 2016-05-21 LAB — GLUCOSE, CAPILLARY: GLUCOSE-CAPILLARY: 82 mg/dL (ref 65–99)

## 2016-05-21 MED ORDER — SENNOSIDES 8.8 MG/5ML PO SYRP
5.0000 mL | ORAL_SOLUTION | Freq: Two times a day (BID) | ORAL | Status: DC
  Administered 2016-05-21 (×2): 5 mL via ORAL
  Filled 2016-05-21 (×3): qty 5

## 2016-05-21 MED ORDER — LACTULOSE 10 GM/15ML PO SOLN
30.0000 g | Freq: Once | ORAL | Status: AC
  Administered 2016-05-21: 30 g via ORAL
  Filled 2016-05-21: qty 45

## 2016-05-21 MED ORDER — POLYETHYLENE GLYCOL 3350 17 G PO PACK
17.0000 g | PACK | Freq: Every day | ORAL | Status: DC
  Administered 2016-05-21: 17 g via ORAL
  Filled 2016-05-21 (×2): qty 1

## 2016-05-21 NOTE — Clinical Social Work Note (Signed)
Clinical Social Work Assessment  Patient Details  Name: [name] MRN: [id number] Date of Birth: 10-Feb-1945  Date of referral:  05/20/16               Reason for consult:  Facility Placement                Permission sought to share information with:  Family Supports Permission granted to share information::  Yes, Verbal Permission Granted  Name::     Cree Kunert  Agency::     Relationship::  Wife  Contact Information:  [phone] (mobile)  Housing/Transportation Living arrangements for the past 2 months:  Single Family Home Source of Information:  Patient, Spouse Patient Interpreter Needed:  None Criminal Activity/Legal Involvement Pertinent to Current Situation/Hospitalization:  No - Comment as needed Significant Relationships:  Spouse, Adult Children Lives with:  Spouse Do you feel safe going back to the place where you live?  No Need for family participation in patient care:  Yes (Comment)  Care giving concerns:  Patient's wife indicated that she works full-time and patient would be home alone and at this time, it would not be safe for patient to be at home alone.  Social Worker assessment / plan: CSW talked with patient and wife at the bedside regarding discharge disposition and recommendation of ST rehab. Patient was sitting up in bed, alert, pleasant, and contributed to the conversation but displayed moments of confusion during his conversation. Mrs. Perezperez is in full agreement with rehab and her preference is [facility] as her husband he has been there before. Patient/wife reported that patient has also been to [facility], however Mr. Thielke stated that he "ran away from there". CSW advised that the couple have a son who is lives with them occasionally, and Mr. Hupp has 2 children from a previous marriage.  Employment status:  Retired Forensic scientist:  Information systems manager, Wellsite geologist) PT Recommendations:  [place] / Referral to  community resources:  [place] (Wife provided with skilled facility list)  Patient/Family's Response to care:  No concerns expressed by patient or wife regarding care during hospitalization.  Patient/Family's Understanding of and Emotional Response to Diagnosis, Current Treatment, and Prognosis: Not discussed.  Emotional Assessment Appearance:  Appears stated age Attitude/Demeanor/Rapport:  Other (Appropriate) Affect (typically observed):  Appropriate, Pleasant Orientation:  Oriented to Self Alcohol / Substance use:  Tobacco Use, Alcohol Use, Illicit Drugs (Patient reports that he has never smoked, drinks wine and does not use illicit drugs) Psych involvement (Current and /or in the community):  No (Comment)  Discharge Needs  Concerns to be addressed:  Discharge Planning Concerns Readmission within the last 30 days:  No Current discharge risk:  None Barriers to Discharge:  No Barriers Identified   Sable Feil, LCSW 05/21/2016, 6:24 PM

## 2016-05-21 NOTE — Progress Notes (Signed)
Physical Therapy Treatment Patient Details Name: [name] MRN: [id number] DOB: 1945-04-10 Today's Date: 05/21/2016    History of Present Illness [name] is a 71 y.o. male with medical history significant for EtOH abuse, EtOH cirrhosis, hypertension, GERD, frequent falls, BPH, memory loss presents to the emergency Department chief complaint of fall. Initial evaluation reveals hyponatremia metabolic acidosis and multiple fx rib and worsening anemia    PT Comments    Pt presents with improved tolerance for gait distance and sequencing. Pt continues to require assistance for bed mobs and transfers and is transitioning from a +2 to +1. Pt is expected to improve to +1 by next session. Pt is able to ambulate short distance to bathroom with Mod A this session due to balance deficits. SNF continues to be the best option for this patient.     Follow Up Recommendations  SNF;Supervision/Assistance - 24 hour     Equipment Recommendations  Rolling walker with 5" wheels;3in1 (PT)    Recommendations for Other Services       Precautions / Restrictions Precautions Precautions: Fall Restrictions Weight Bearing Restrictions: No    Mobility  Bed Mobility Overal bed mobility: Needs Assistance Bed Mobility: Supine to Sit     Supine to sit: Mod assist;+2 for physical assistance     General bed mobility comments: Mod A x2 to sit EOB  Transfers Overall transfer level: Needs assistance Equipment used: Rolling walker (2 wheeled) Transfers: Sit to/from Stand Sit to Stand: Mod assist;+2 safety/equipment         General transfer comment: Mod A to stand from EOB. Cues for pushing up from bed, pt grabs for walker  Ambulation/Gait Ambulation/Gait assistance: Mod assist;+2 physical assistance Ambulation Distance (Feet): 20 Feet Assistive device: Rolling walker (2 wheeled) Gait Pattern/deviations: Decreased step length - right;Decreased step length - left;Decreased stance time -  right;Decreased stance time - left;Narrow base of support Gait velocity: decreased Gait velocity interpretation: Below normal speed for age/gender General Gait Details: improved sequencing, wide VOS and shuffeling pattern. Cues for turning   Stairs            Wheelchair Mobility    Modified Rankin (Stroke Patients Only)       Balance                                            Cognition Arousal/Alertness: Awake/alert Behavior During Therapy: WFL for tasks assessed/performed Overall Cognitive Status: Within Functional Limits for tasks assessed                                 General Comments: per wife, pt is [facility]      Exercises      General Comments        Pertinent Vitals/Pain Pain Assessment: Faces Faces Pain Scale: Hurts little more Pain Location: L ribs with mobility Pain Descriptors / Indicators: Grimacing;Guarding Pain Intervention(s): Monitored during session    Home Living                      Prior Function            PT Goals (current goals can now be found in the care plan section) Acute Rehab PT Goals Patient Stated Goal: Did not state, but is agreeable to SNF for rehab Progress  towards PT goals: Progressing toward goals    Frequency    Min 2X/week      PT Plan Frequency needs to be updated    Co-evaluation              AM-PAC PT "6 Clicks" Daily Activity  Outcome Measure  Difficulty turning over in bed (including adjusting bedclothes, sheets and blankets)?: Total Difficulty moving from lying on back to sitting on the side of the bed? : Total Difficulty sitting down on and standing up from a chair with arms (e.g., wheelchair, bedside commode, etc,.)?: A Lot Help needed moving to and from a bed to chair (including a wheelchair)?: A Lot Help needed walking in hospital room?: A Lot Help needed climbing 3-5 steps with a railing? : Total 6 Click Score: 9    End of Session Equipment  Utilized During Treatment: Gait belt Activity Tolerance: Patient tolerated treatment well Patient left: in bed;with call bell/phone within reach;with family/visitor present;Other (comment) (ECHO) Nurse Communication: Mobility status PT Visit Diagnosis: Unsteadiness on feet (R26.81);Muscle weakness (generalized) (M62.81);Pain Pain - Right/Left: Left Pain - part of body:  (ribs)     Time: 2841-3244 PT Time Calculation (min) (ACUTE ONLY): 18 min  Charges:  $Therapeutic Activity: 8-22 mins                    G Codes:       Scheryl Marten PT, DPT  [phone]    Shanon Rosser 05/21/2016, 10:15 AM

## 2016-05-21 NOTE — Care Management Important Message (Signed)
Important Message  Patient Details  Name: [name] MRN: [id number] Date of Birth: November 05, 1945   Medicare Important Message Given:  Yes    Tadhg Eskew, Rory Percy, RN 05/21/2016, 10:26 AM

## 2016-05-21 NOTE — Progress Notes (Signed)
  Echocardiogram 2D Echocardiogram has been performed.  [name] 05/21/2016, 10:44 AM

## 2016-05-21 NOTE — Care Management Note (Signed)
Case Management Note  Patient Details  Name: [name] MRN: [id number] Date of Birth: 03/31/1945  Subjective/Objective:        CM following for progression and d/c planning.             Action/Plan: 05/21/2016 Met with pt and wife on 05/17/2016 and today at 05/21/2016 re Medicare benefits . Plan is to d/c to SNF at this time, will follow or any changes or any other needs.     Expected Discharge Date:                  Expected Discharge Plan:  Skilled Nursing Facility  In-House Referral:  Clinical Social Work  Discharge planning Services  NA  Post Acute Care Choice:  NA Choice offered to:  NA  DME Arranged:  N/A DME Agency:  NA  HH Arranged:  NA HH Agency:  NA  Status of Service:  Completed, signed off  If discussed at Long Length of Stay Meetings, dates discussed:    Additional Comments:  ,  U, RN 05/21/2016, 2:51 PM  

## 2016-05-21 NOTE — Clinical Social Work Placement (Signed)
   CLINICAL SOCIAL WORK PLACEMENT  NOTE  Date:  05/21/2016  Patient Details  Name: [name] MRN: [id number] Date of Birth: 08-22-1945  Clinical Social Work is seeking post-discharge placement for this patient at the [place] level of care (*CSW will initial, date and re-position this form in  chart as items are completed):  Yes   Patient/family provided with [facility] Work Department's list of facilities offering this level of care within the geographic area requested by the patient (or if unable, by the patient's family).  Yes   Patient/family informed of their freedom to choose among providers that offer the needed level of care, that participate in Medicare, Medicaid or managed care program needed by the patient, have an available bed and are willing to accept the patient.  Yes   Patient/family informed of [facility]'s ownership interest in [facility] and [facility], as well as of the fact that they are under no obligation to receive care at these facilities.  PASRR submitted to EDS on       PASRR number received on       Existing PASRR number confirmed on 05/20/16     FL2 transmitted to all facilities in geographic area requested by pt/family on 05/20/16     FL2 transmitted to all facilities within larger geographic area on       Patient informed that his/her managed care company has contracts with or will negotiate with certain facilities, including the following:            Patient/family informed of bed offers received.  Patient chooses bed at       Physician recommends and patient chooses bed at      Patient to be transferred to   on  .  Patient to be transferred to facility by       Patient family notified on   of transfer.  Name of family member notified:        PHYSICIAN       Additional Comment:    _______________________________________________ Sable Feil, LCSW 05/21/2016, 6:34 PM

## 2016-05-21 NOTE — Progress Notes (Addendum)
PROGRESS NOTE    [name]  ZOX:[id number] DOB: December 30, 1945 DOA: 05/17/2016 PCP: Lucille Passy, MD   Brief Narrative: 71 year old male with history of alcohol abuse, memory loss, PPS, frequent falls presented after a fall and sustaining ribs fracture. On admission patient was found to have a drop in hemoglobin and possible syncopal episode with significant hyponatremia.  Assessment & Plan:  # Symptomatic anemia: Of unknown etiology -Patient has no sign of bleeding. Evaluated by GI, recommended no further evaluation this time. Vitamin W11, folic acid level, iron stores acceptable. Patient received red blood cell transfusion during this hospitalization. CT scan of abdomen with no bleeding. No sign of hemolysis or problem with bone marrow as per hematologist. -Receiving 2 units of red blood cell transfusion yesterday with not acceptable response. He has no sign or symptoms except chronic stable left-sided chest pain at the site of the fracture. Chest x-ray obtained showed no change in effusion therefore less likely to have active bleed. I would repeat CBC tomorrow. Continue to provide supportive care  #Syncope versus presyncopal episode leading to fall with rips fracture: CT head unremarkable. Echo done today, pending result. Monitor electrolytes, blood pressure and provide supportive care.  #Multiple left-sided ribs fracture: Continue supportive care with pain management and incentive spirometer.  # Pain management: Continue oral pain medication. Continue supportive care.  #Hyponatremia likely in the setting of alcohol abuse: Patient with lower extremity edema. No improvement in serum sodium level with normal saline IV. I continue IV Lasix and monitor BMP. -Continue oral KCL. -Magnesium level I.8, will replete today -check urine Na, osmolality  #Left-sided chest pain due to rib fracture. EKG with no acute change. Troponin negative. Echo pending.  #Alcohol abuse: Continue CIWA protocol.  Currently on Ativan as needed.  #History of bullous pemphigus left foot: Currently on oral antibiotics and tapering dose of steroid. As per medical record patient was on 4 days of oral antibiotics before admission.  # Thrombocytopenia: Unknown if it is related with alcohol and liver disease. No cirrhosis mentioned in CT scan of abdomen and pelvis. I discontinued Lovenox subcutaneous. Monitor CBC. Platelet 138  #Physical deconditioning: Discussed with the patient's wife at bedside. Evaluated by PT OT and Education officer, museum. Patient will likely discharge to skilled nursing facility at the end of this hospitalization.   #Constipation: Ordered MiraLAX, Senokot and lactulose. Discussed with the nurse. We'll send fecal occult blood test when  patient has bowel movement.  Principal Problem:   Syncope Active Problems:   HLD (hyperlipidemia)   Anxiety state   Alcohol abuse   Essential hypertension   BPH (benign prostatic hyperplasia)   Hyponatremia   Chronic pain syndrome   Cellulitis   Rib fractures   Bullous pemphigoid   Symptomatic anemia   Syncope and collapse  DVT prophylaxis: SCD. Discontinue Lovenox because of thrombocytopenia and anemia Code Status: Full code Family Communication: Discussed with the patient's wife at bedside Disposition Plan: Likely discharge to SNF in 1-2 days  Consultants:   GI  Hematology  Procedures:  CT chest 05/17/2016  CT head and CT C-spine 05/17/2016  Chest x-ray 05/17/2016  2 units packed red blood cells 05/18/2016 Antimicrobials: doxy Subjective: Patient was seen and examined at bedside. No new event. Patient is more alert awake. Continue to have a stable left-sided chest pain increased with movement and breathing. Denied headache, nausea, vomiting, abdominal pain, shortness of breath or extremities pain Objective: Vitals:   05/20/16 1601 05/20/16 1730 05/20/16 2058 05/21/16 0426  BP:  121/71 134/84 125/66 (!) 111/59  Pulse: 78 82 79 84    Resp: 18 16 18 17   Temp: 97.5 F (36.4 C) 98.3 F (36.8 C) 98.4 F (36.9 C) 97.8 F (36.6 C)  TempSrc: Oral Oral    SpO2: 100%  100% 97%  Weight:   86.6 kg (191 lb)   Height:        Intake/Output Summary (Last 24 hours) at 05/21/16 1236 Last data filed at 05/21/16 1119  Gross per 24 hour  Intake              910 ml  Output             2875 ml  Net            -1965 ml   Filed Weights   05/19/16 0428 05/19/16 2100 05/20/16 2058  Weight: 85.7 kg (188 lb 15 oz) 88.6 kg (195 lb 5.2 oz) 86.6 kg (191 lb)    Examination:  General exam:Alert awake and looks comfortable  Respiratory system: Clear bilateral, poor respiratory effort because of pain on left ribs Cardiovascular system: Regular rate rhythm, S1 and S2 normal  Gastrointestinal system: Abdomen soft, nontender, nondistended. Bowel sound positive Central nervous system: Alert awake and following commands Extremities: Bilateral lower extremities pitting edema mildly improving Skin: No rashes, lesions or ulcers    Data Reviewed: I have personally reviewed following labs and imaging studies  CBC:  Recent Labs Lab 05/17/16 0934 05/18/16 0725 05/18/16 1820 05/19/16 0506 05/20/16 0446 05/21/16 0537  WBC 17.3* 7.6  --  11.9* 9.4 6.6  NEUTROABS 13.5*  --   --  8.8*  --   --   HGB 8.2* 6.0* 8.0* 7.7* 6.5* 7.6*  HCT 22.5* 16.6* 22.9* 21.3* 18.9* 22.4*  MCV 88.2 88.8  --  87.3 87.9 87.8  PLT 217 134*  --  125* 128* 016*   Basic Metabolic Panel:  Recent Labs Lab 05/17/16 1449 05/18/16 0451 05/18/16 0950 05/19/16 0506 05/20/16 0857 05/21/16 0537  NA 123* 124*  --  125* 126* 128*  K 4.3 5.1  --  3.9 3.9 3.4*  CL 90* 93*  --  92* 93* 96*  CO2 25 22  --  24 24 27   GLUCOSE 116* 99  --  124* 105* 82  BUN 43* 39*  --  30* 25* 18  CREATININE 0.98 0.87  --  0.92 0.83 0.81  CALCIUM 8.6* 7.9*  --  7.8* 8.2* 7.9*  MG  --   --  1.7 2.5*  --  1.8  PHOS  --   --   --   --  2.6 2.3*   GFR: Estimated Creatinine  Clearance: 93.9 mL/min (by C-G formula based on SCr of 0.81 mg/dL). Liver Function Tests:  Recent Labs Lab 05/17/16 0934 05/18/16 0451 05/19/16 0506 05/20/16 0857 05/21/16 0537  AST 32 34 24  --   --   ALT 31 24 26   --   --   ALKPHOS 56 40 54  --   --   BILITOT 1.1 1.5* 1.5*  --   --   PROT 5.4* 3.9* 4.5*  --   --   ALBUMIN 3.2* 2.4* 2.6* 2.7* 2.3*    Recent Labs Lab 05/17/16 0934  LIPASE 26    Recent Labs Lab 05/20/16 0446  AMMONIA 18   Coagulation Profile:  Recent Labs Lab 05/19/16 0506  INR 1.25   Cardiac Enzymes:  Recent Labs Lab 05/17/16 0934 05/19/16 0743  TROPONINI <0.03 <0.03   BNP (last 3 results) No results for input(s): PROBNP in the last 8760 hours. HbA1C: No results for input(s): HGBA1C in the last 72 hours. CBG:  Recent Labs Lab 05/18/16 0755 05/19/16 0805 05/20/16 0746 05/21/16 0808  GLUCAP 114* 118* 110* 82   Lipid Profile: No results for input(s): CHOL, HDL, LDLCALC, TRIG, CHOLHDL, LDLDIRECT in the last 72 hours. Thyroid Function Tests: No results for input(s): TSH, T4TOTAL, FREET4, T3FREE, THYROIDAB in the last 72 hours. Anemia Panel: No results for input(s): VITAMINB12, FOLATE, FERRITIN, TIBC, IRON, RETICCTPCT in the last 72 hours. Sepsis Labs: No results for input(s): PROCALCITON, LATICACIDVEN in the last 168 hours.  Recent Results (from the past 240 hour(s))  MRSA PCR Screening     Status: Abnormal   Collection Time: 05/17/16 10:42 PM  Result Value Ref Range Status   MRSA by PCR POSITIVE (A) NEGATIVE Final    Comment:        The GeneXpert MRSA Assay (FDA approved for NASAL specimens only), is one component of a comprehensive MRSA colonization surveillance program. It is not intended to diagnose MRSA infection nor to guide or monitor treatment for MRSA infections. RESULT CALLED TO, READ BACK BY AND VERIFIED WITH: CALLED TO V.BROWN AT 0317 BY L.PITT 05/18/16          Radiology Studies: Dg Chest 2  View  Result Date: 05/20/2016 CLINICAL DATA:  Recurrent falls, pt has a fall from his recliner yesterday with left sided rib pain EXAM: CHEST - 2 VIEW COMPARISON:  05/17/2016 FINDINGS: Multiple left rib fractures as before. Further displacement of the anterolateral fractures of left ribs 4 and 5. Lateral and apical pleural thickening as before probably related to hematoma. No pneumothorax. Lungs are clear. Heart size upper limits normal.  Tortuous thoracic aorta. Small left pleural effusion. Compression deformity in the upper lumbar spine as before. Fixation hardware in the proximal left humerus, incompletely visualized. IMPRESSION: 1. Left rib fractures and upper lumbar compression deformity. 2. Small left pleural effusion.  No pneumothorax. Electronically Signed   By: Lucrezia Europe M.D.   On: 05/20/2016 09:42        Scheduled Meds: . B-complex with vitamin C  1 tablet Oral Daily  . Chlorhexidine Gluconate Cloth  6 each Topical Q0600  . doxycycline  100 mg Oral BID  . feeding supplement (ENSURE ENLIVE)  237 mL Oral BID BM  . folic acid  1 mg Oral Daily  . furosemide  20 mg Intravenous Daily  . loratadine  10 mg Oral Daily  . metoprolol succinate  25 mg Oral Daily  . mupirocin ointment  1 application Nasal BID  . pantoprazole  40 mg Oral Q0600  . polyethylene glycol  17 g Oral Daily  . potassium chloride  30 mEq Oral Daily  . QUEtiapine  300 mg Oral QHS  . sennosides  5 mL Oral BID  . sodium chloride flush  3 mL Intravenous Q12H  . thiamine  100 mg Oral Daily  . traZODone  50 mg Oral QHS   Continuous Infusions: . sodium chloride       LOS: 3 days    Briza Bark Tanna Furry, MD [facility] Pager [phone]  If 7PM-7AM, please contact night-coverage [email or web address] Password [facility] 05/21/2016, 12:36 PM

## 2016-05-22 DIAGNOSIS — S2242XS Multiple fractures of ribs, left side, sequela: Secondary | ICD-10-CM | POA: Diagnosis not present

## 2016-05-22 DIAGNOSIS — E871 Hypo-osmolality and hyponatremia: Secondary | ICD-10-CM | POA: Diagnosis not present

## 2016-05-22 DIAGNOSIS — F1029 Alcohol dependence with unspecified alcohol-induced disorder: Secondary | ICD-10-CM | POA: Diagnosis not present

## 2016-05-22 DIAGNOSIS — S2242XA Multiple fractures of ribs, left side, initial encounter for closed fracture: Secondary | ICD-10-CM | POA: Diagnosis not present

## 2016-05-22 DIAGNOSIS — I10 Essential (primary) hypertension: Secondary | ICD-10-CM | POA: Diagnosis not present

## 2016-05-22 DIAGNOSIS — R2681 Unsteadiness on feet: Secondary | ICD-10-CM | POA: Diagnosis not present

## 2016-05-22 DIAGNOSIS — M6281 Muscle weakness (generalized): Secondary | ICD-10-CM | POA: Diagnosis not present

## 2016-05-22 DIAGNOSIS — R531 Weakness: Secondary | ICD-10-CM | POA: Diagnosis not present

## 2016-05-22 DIAGNOSIS — E785 Hyperlipidemia, unspecified: Secondary | ICD-10-CM | POA: Diagnosis not present

## 2016-05-22 DIAGNOSIS — D62 Acute posthemorrhagic anemia: Secondary | ICD-10-CM | POA: Diagnosis not present

## 2016-05-22 DIAGNOSIS — K7030 Alcoholic cirrhosis of liver without ascites: Secondary | ICD-10-CM | POA: Diagnosis not present

## 2016-05-22 DIAGNOSIS — K5900 Constipation, unspecified: Secondary | ICD-10-CM | POA: Diagnosis not present

## 2016-05-22 DIAGNOSIS — I420 Dilated cardiomyopathy: Secondary | ICD-10-CM | POA: Diagnosis not present

## 2016-05-22 DIAGNOSIS — R5381 Other malaise: Secondary | ICD-10-CM | POA: Diagnosis not present

## 2016-05-22 DIAGNOSIS — F329 Major depressive disorder, single episode, unspecified: Secondary | ICD-10-CM | POA: Diagnosis not present

## 2016-05-22 DIAGNOSIS — Z9181 History of falling: Secondary | ICD-10-CM | POA: Diagnosis not present

## 2016-05-22 DIAGNOSIS — L309 Dermatitis, unspecified: Secondary | ICD-10-CM | POA: Diagnosis not present

## 2016-05-22 DIAGNOSIS — E8809 Other disorders of plasma-protein metabolism, not elsewhere classified: Secondary | ICD-10-CM | POA: Diagnosis not present

## 2016-05-22 DIAGNOSIS — R079 Chest pain, unspecified: Secondary | ICD-10-CM | POA: Diagnosis not present

## 2016-05-22 DIAGNOSIS — F1027 Alcohol dependence with alcohol-induced persisting dementia: Secondary | ICD-10-CM | POA: Diagnosis not present

## 2016-05-22 DIAGNOSIS — Z5189 Encounter for other specified aftercare: Secondary | ICD-10-CM | POA: Diagnosis not present

## 2016-05-22 DIAGNOSIS — S2242XD Multiple fractures of ribs, left side, subsequent encounter for fracture with routine healing: Secondary | ICD-10-CM | POA: Diagnosis not present

## 2016-05-22 DIAGNOSIS — D509 Iron deficiency anemia, unspecified: Secondary | ICD-10-CM | POA: Diagnosis not present

## 2016-05-22 DIAGNOSIS — L120 Bullous pemphigoid: Secondary | ICD-10-CM | POA: Diagnosis not present

## 2016-05-22 DIAGNOSIS — R55 Syncope and collapse: Secondary | ICD-10-CM | POA: Diagnosis not present

## 2016-05-22 DIAGNOSIS — E440 Moderate protein-calorie malnutrition: Secondary | ICD-10-CM | POA: Diagnosis not present

## 2016-05-22 DIAGNOSIS — D649 Anemia, unspecified: Secondary | ICD-10-CM | POA: Diagnosis not present

## 2016-05-22 LAB — RENAL FUNCTION PANEL
ANION GAP: 7 (ref 5–15)
Albumin: 2.3 g/dL — ABNORMAL LOW (ref 3.5–5.0)
BUN: 16 mg/dL (ref 6–20)
CALCIUM: 7.9 mg/dL — AB (ref 8.9–10.3)
CO2: 28 mmol/L (ref 22–32)
Chloride: 94 mmol/L — ABNORMAL LOW (ref 101–111)
Creatinine, Ser: 0.82 mg/dL (ref 0.61–1.24)
GFR calc non Af Amer: >60 mL/min (ref >60)
Glucose, Bld: 98 mg/dL (ref 65–99)
POTASSIUM: 3.6 mmol/L (ref 3.5–5.1)
Phosphorus: 2.8 mg/dL (ref 2.5–4.6)
Sodium: 129 mmol/L — ABNORMAL LOW (ref 135–145)

## 2016-05-22 LAB — GLUCOSE, CAPILLARY: GLUCOSE-CAPILLARY: 102 mg/dL — AB (ref 65–99)

## 2016-05-22 LAB — CBC
HEMATOCRIT: 23.1 % — AB (ref 39.0–52.0)
HEMOGLOBIN: 7.8 g/dL — AB (ref 13.0–17.0)
MCH: 30.2 pg (ref 26.0–34.0)
MCHC: 33.8 g/dL (ref 30.0–36.0)
MCV: 89.5 fL (ref 78.0–100.0)
Platelets: 164 10*3/uL (ref 150–400)
RBC: 2.58 MIL/uL — AB (ref 4.22–5.81)
RDW: 15.7 % — ABNORMAL HIGH (ref 11.5–15.5)
WBC: 7 10*3/uL (ref 4.0–10.5)

## 2016-05-22 MED ORDER — OXYCODONE HCL 5 MG PO TABS: 5.0000 mg | ORAL_TABLET | Freq: Four times a day (QID) | ORAL | 0 refills | Status: DC | PRN

## 2016-05-22 MED ORDER — POTASSIUM CHLORIDE CRYS ER 15 MEQ PO TBCR: 30.0000 meq | EXTENDED_RELEASE_TABLET | Freq: Every day | ORAL | Status: DC

## 2016-05-22 MED ORDER — FUROSEMIDE 40 MG PO TABS: 40.0000 mg | ORAL_TABLET | Freq: Every day | ORAL | 0 refills | Status: DC

## 2016-05-22 MED ORDER — POLYETHYLENE GLYCOL 3350 17 G PO PACK: 17.0000 g | PACK | Freq: Every day | ORAL | 0 refills | Status: DC

## 2016-05-22 MED ORDER — ENSURE ENLIVE PO LIQD: 237.0000 mL | Freq: Two times a day (BID) | ORAL | 12 refills | Status: DC

## 2016-05-22 NOTE — Progress Notes (Signed)
AVS discussed and given to wife.  All questions answered.

## 2016-05-22 NOTE — NC FL2 (Signed)
[place] LEVEL OF CARE SCREENING TOOL     IDENTIFICATION  Patient Name: [name] Birthdate: October 11, 1945 Sex: male Admission Date (Current Location): 05/17/2016  [facility] and [place] Number:  Herbalist and Address:  [facility] [place], [place] [address] [place],  [address]      Provider Number: [id number]  Attending Physician Name and Address:  Rosita Fire, MD  Relative Name and Phone Number:  Tanuj, Mullens - [phone] (work), [phone] (mobile)     Current Level of Care: Hospital Recommended Level of Care: [place] Prior Approval Number:    Date Approved/Denied:   PASRR Number: [id number] (Eff. 11/08/11)  Discharge Plan: SNF    Current Diagnoses: Patient Active Problem List   Diagnosis Date Noted  . Anemia 05/18/2016  . Syncope and collapse   . Syncope 05/17/2016  . Cellulitis 05/17/2016  . Rib fractures 05/17/2016  . Bullous pemphigoid 05/17/2016  . ETOH abuse 03/17/2016  . Dizziness and giddiness 12/09/2015  . Chronic pain syndrome 12/09/2015  . Arthritis 11/20/2015  . Mild CAD   . DCM (dilated cardiomyopathy) ([place])   . Abnormal nuclear stress test 05/07/2015  . PAC (premature atrial contraction) 04/07/2015  . Atrial tachycardia, paroxysmal ([place]) 03/10/2015  . Depression 04/12/2012  . C2 cervical fracture ([place]) 02/29/2012  . Fracture of humerus, proximal, left, closed 11/02/2011  . Hyponatremia 11/17/2010  . H/O: upper GI bleed 10/20/2010  . Stomach ulcer from aspirin/ibuprofen-like drugs (NSAID's) 10/20/2010  . Colon polyp 08/26/2010  . Diverticulosis of colon (without mention of hemorrhage) 08/26/2010  . BPH (benign prostatic hyperplasia) 07/02/2010  . Deficiency anemia 03/09/2010  . Hyposmolality and/or hyponatremia 10/17/2009  . ALCOHOLIC CIRRHOSIS OF LIVER 07/25/2009  . Anxiety state 01/24/2007  . HLD (hyperlipidemia) 07/28/2006  . Alcohol abuse 07/28/2006  .  Essential hypertension 07/28/2006  . IMPOTENCE, ORGANIC ORIGIN 07/28/2006    Orientation RESPIRATION BLADDER Height & Weight     Self, Time, Situation, Place  Normal Continent (External urinary catheter placed 5/8) Weight: 185 lb 8 oz (84.1 kg) Height:  5\' 9"  (175.3 cm)  BEHAVIORAL SYMPTOMS/MOOD NEUROLOGICAL BOWEL NUTRITION STATUS      Continent Diet (Heart healthy)  AMBULATORY STATUS COMMUNICATION OF NEEDS Skin   Extensive Assist Verbally Other (Comment) (Blister left foot; Ecchymosis right/left arm and hip;  Skin tear left elbow with gauze dressing)                       Personal Care Assistance Level of Assistance  Bathing, Feeding, Dressing Bathing Assistance: Maximum assistance Feeding assistance: Independent Dressing Assistance: Maximum assistance     Functional Limitations Info  Sight, Hearing, Speech Sight Info: Adequate (Hx of cataract) Hearing Info: Adequate Speech Info: Adequate    SPECIAL CARE FACTORS FREQUENCY  PT (By licensed PT)     PT Frequency: Evaluated 5/9 and a minimum of 3x per week therapy recommended              Contractures Contractures Info: Not present    Additional Factors Info  Code Status, Allergies   Isolate precautions Code Status Info: Full Allergies Info: Nifedipine    Contact: MRSA       Current Medications (05/22/2016):  This is the current hospital active medication list Current Facility-Administered Medications  Medication Dose Route Frequency Provider Last Rate Last Dose  . 0.9 %  sodium chloride infusion   Intravenous Once Eugenie Filler, MD      .  B-complex with vitamin C tablet 1 tablet  1 tablet Oral Daily Waldemar Dickens, MD   1 tablet at 05/21/16 0934  . Chlorhexidine Gluconate Cloth 2 % PADS 6 each  6 each Topical Q0600 Waldemar Dickens, MD   6 each at 05/22/16 [phone]  . doxycycline (VIBRA-TABS) tablet 100 mg  100 mg Oral BID Waldemar Dickens, MD   100 mg at 05/22/16 0813  . famotidine (PEPCID) tablet 20  mg  20 mg Oral Daily PRN Gardiner Barefoot, NP   20 mg at 05/21/16 2146  . feeding supplement (ENSURE ENLIVE) (ENSURE ENLIVE) liquid 237 mL  237 mL Oral BID BM Waldemar Dickens, MD   237 mL at 05/18/16 1500  . folic acid (FOLVITE) tablet 1 mg  1 mg Oral Daily Radene Gunning, NP   1 mg at 05/21/16 0935  . furosemide (LASIX) injection 20 mg  20 mg Intravenous Daily Rosita Fire, MD   20 mg at 05/21/16 0934  . loratadine (CLARITIN) tablet 10 mg  10 mg Oral Daily Radene Gunning, NP   10 mg at 05/21/16 0935  . metoprolol succinate (TOPROL-XL) 24 hr tablet 25 mg  25 mg Oral Daily Radene Gunning, NP   25 mg at 05/21/16 0934  . mupirocin ointment (BACTROBAN) 2 % 1 application  1 application Nasal BID Waldemar Dickens, MD   1 application at 65/68/12 2150  . nitroGLYCERIN (NITROSTAT) SL tablet 0.4 mg  0.4 mg Sublingual Q5 min PRN Kirby-Graham, Karsten Fells, NP      . ondansetron [facility]) injection 4 mg  4 mg Intravenous Q4H PRN Radene Gunning, NP   4 mg at 05/19/16 0248  . oxyCODONE (Oxy IR/ROXICODONE) immediate release tablet 5 mg  5 mg Oral Q6H PRN Radene Gunning, NP   5 mg at 05/22/16 0528  . pantoprazole (PROTONIX) EC tablet 40 mg  40 mg Oral Q0600 Vena Rua, PA-C   40 mg at 05/22/16 0813  . polyethylene glycol (MIRALAX / GLYCOLAX) packet 17 g  17 g Oral Daily Rosita Fire, MD   17 g at 05/21/16 0934  . potassium chloride (K-DUR,KLOR-CON) CR tablet 30 mEq  30 mEq Oral Daily Rosita Fire, MD   30 mEq at 05/21/16 0934  . prochlorperazine (COMPAZINE) injection 10 mg  10 mg Intravenous Q6H PRN Eugenie Filler, MD   10 mg at 05/18/16 1830  . QUEtiapine (SEROQUEL) tablet 300 mg  300 mg Oral QHS Radene Gunning, NP   300 mg at 05/21/16 2146  . sennosides (SENOKOT) 8.8 MG/5ML syrup 5 mL  5 mL Oral BID Rosita Fire, MD   5 mL at 05/21/16 2151  . sodium chloride flush (NS) 0.9 % injection 3 mL  3 mL Intravenous Q12H Radene Gunning, NP   3 mL at 05/21/16 2151  . thiamine  (VITAMIN B-1) tablet 100 mg  100 mg Oral Daily Radene Gunning, NP   100 mg at 05/21/16 0934  . traZODone (DESYREL) tablet 50 mg  50 mg Oral QHS Radene Gunning, NP   50 mg at 05/21/16 2146     Discharge Medications: Please see discharge summary for a list of discharge medications.  Relevant Imaging Results:  Relevant Lab Results:   Additional Information ss#[id number].   Candie Chroman, LCSW

## 2016-05-22 NOTE — Progress Notes (Signed)
Report given to Stephannie Peters at [facility].  All questions answered.

## 2016-05-22 NOTE — Clinical Social Work Note (Signed)
CSW facilitated patient discharge including contacting patient family and facility to confirm patient discharge plans. Clinical information faxed to facility and family agreeable with plan. CSW arranged ambulance transport via [facility] to [facility] RN to call report prior to discharge ([phone] Room [phone]).  CSW will sign off for now as social work intervention is no longer needed. Please consult [place] again if new needs arise.  Dayton Scrape, [place]

## 2016-05-22 NOTE — Clinical Social Work Placement (Signed)
   CLINICAL SOCIAL WORK PLACEMENT  NOTE  Date:  05/22/2016  Patient Details  Name: [name] MRN: [id number] Date of Birth: 08/25/1945  Clinical Social Work is seeking post-discharge placement for this patient at the [place] level of care (*CSW will initial, date and re-position this form in  chart as items are completed):  Yes   Patient/family provided with [facility] Work Department's list of facilities offering this level of care within the geographic area requested by the patient (or if unable, by the patient's family).  Yes   Patient/family informed of their freedom to choose among providers that offer the needed level of care, that participate in Medicare, Medicaid or managed care program needed by the patient, have an available bed and are willing to accept the patient.  Yes   Patient/family informed of [facility]'s ownership interest in [facility] and [facility], as well as of the fact that they are under no obligation to receive care at these facilities.  PASRR submitted to EDS on       PASRR number received on       Existing PASRR number confirmed on 05/20/16     FL2 transmitted to all facilities in geographic area requested by pt/family on 05/20/16     FL2 transmitted to all facilities within larger geographic area on       Patient informed that his/her managed care company has contracts with or will negotiate with certain facilities, including the following:        Yes   Patient/family informed of bed offers received.  Patient chooses bed at [facility]     Physician recommends and patient chooses bed at      Patient to be transferred to [facility] on 05/22/16.  Patient to be transferred to facility by [facility]     Patient family notified on 05/22/16 of transfer.  Name of family member notified:  Bing Ree     PHYSICIAN Please prepare prescriptions     Additional Comment:     _______________________________________________ Candie Chroman, LCSW 05/22/2016, 1:06 PM

## 2016-05-22 NOTE — Discharge Summary (Addendum)
Physician Discharge Summary  [name] VVO:[id number] DOB: 09/02/1945 DOA: 05/17/2016  PCP: Lucille Passy, MD  Admit date: 05/17/2016 Discharge date: 05/22/2016  Admitted From:home Disposition:SNF  Recommendations for Outpatient Follow-up:  1. Follow up with PCP in 1-2 weeks 2. Please obtain BMP/CBC in one week  Home Health:SNF Equipment/Devices:incentive spirometer Discharge Condition:stable CODE STATUS:full code Diet recommendation:heart healthy  Brief/Interim Summary:71 year old male with history of alcohol abuse, memory loss, PPS, frequent falls presented after a fall and sustaining ribs fracture. On admission patient was found to have a drop in hemoglobin and possible syncopal episode with significant hyponatremia.  # Symptomatic anemia: Of unknown etiology -Patient has no sign of bleeding. Evaluated by GI, recommended no further evaluation this time. Vitamin Y69, folic acid level, iron stores acceptable. Patient received red blood cell transfusion during this hospitalization. CT scan of abdomen with no bleeding. No sign of hemolysis or problem with bone marrow as per hematologist. -Hemoglobin is stable. Clinically stable. Recommended to monitor labs outpatient.  #Syncope versus presyncopal episode leading to fall with rips fracture, may be due to severe anemia. CT head unremarkable. Echo unremarkable. Medically improved.  #Multiple left-sided ribs fracture: Continue supportive care with pain management and incentive spirometer. Education provided to the patient regarding importance of taking deep breath and incentive spirometer. He verbalized understanding.  # Pain management: Continue oral pain medication. Continue supportive care.  #Hyponatremia likely in the setting of alcohol abuse: Patient with lower extremity edema.  -Discharging with oral Lasix, potassium chloride. Serum sodium level stable. Recommended to repeat BMP in a week with PCP.  #Left-sided chest pain due  to rib fracture. EKG with no acute change. Troponin negative. Echo reviewed.  #Alcohol abuse: No sign of withdrawal. Education provided to the patient to quit alcohol.  #History of bullous pemphigus left foot: Completed antibiotics course recommended outpatient follow-up..  # Thrombocytopenia: Improved  #Physical deconditioning: Discussed with the patient's wife at bedside. Evaluated by PT OT and Education officer, museum. Patient will be discharged to skilled nursing facility for continuation of the care. Recommended outpatient rehabilitation.  #Constipation: Continue bowel regimen.  Patient is clinically stable. Hemoglobin is stable today. No sign of bleeding. Sitting on chair comfortable. Pain is controlled with oral regimen. Discussed with the patient and his wife at bedside in detail regarding discharge planning. Verbalized understanding. Patient will be discharged to skilled nursing facility in stable condition with outpatient follow-up. Also discussed with the Education officer, museum. Patient insist on going to skilled nursing facility with Foley catheter and refused to take out in the hospital. Education provided to the patient regarding the risk of infection and made depending on Foley catheter. He verbalized understanding. Education provided to the patient to have evaluation by nursing staff at a skilled nurse facility for discontinuation of catheter.  Discharge Diagnoses:  Principal Problem:   Syncope Active Problems:   HLD (hyperlipidemia)   Anxiety state   Alcohol abuse   Essential hypertension   ALCOHOLIC CIRRHOSIS OF LIVER   BPH (benign prostatic hyperplasia)   Hyponatremia   DCM (dilated cardiomyopathy) (HCC)   Chronic pain syndrome   Cellulitis   Rib fractures   Bullous pemphigoid   Anemia   Syncope and collapse    Discharge Instructions  Discharge Instructions    Call MD for:  difficulty breathing, headache or visual disturbances    Complete by:  As directed    Call MD for:   extreme fatigue    Complete by:  As directed    Call MD  for:  hives    Complete by:  As directed    Call MD for:  persistant dizziness or light-headedness    Complete by:  As directed    Call MD for:  persistant nausea and vomiting    Complete by:  As directed    Call MD for:  severe uncontrolled pain    Complete by:  As directed    Call MD for:  temperature >100.4    Complete by:  As directed    Diet - low sodium heart healthy    Complete by:  As directed    Discharge instructions    Complete by:  As directed    Please do trial of void in 2-3 days and remove the foley catheter.   Increase activity slowly    Complete by:  As directed      Allergies as of 05/22/2016      Reactions   Nifedipine Swelling   REACTION: SWELLING, 11/02/2011 pt denies this allergy.      Medication List    STOP taking these medications   doxycycline 100 MG tablet Commonly known as:  VIBRA-TABS     TAKE these medications   cetirizine 10 MG tablet Commonly known as:  ZYRTEC Take 10 mg by mouth daily.   feeding supplement (ENSURE ENLIVE) Liqd Take 237 mLs by mouth 2 (two) times daily between meals.   furosemide 40 MG tablet Commonly known as:  LASIX Take 1 tablet (40 mg total) by mouth daily.   lidocaine 5 % Commonly known as:  LIDODERM Place 1 patch onto the skin daily. Remove & Discard patch within 12 hours or as directed by MD What changed:  when to take this  reasons to take this  additional instructions   Magnesium 500 MG Tabs Take 500 mg by mouth daily as needed (energy).   mupirocin ointment 2 % Commonly known as:  BACTROBAN Apply 1 application topically daily. Left foot   naproxen sodium 220 MG tablet Commonly known as:  ANAPROX Take 440-880 mg by mouth 2 (two) times daily as needed (PAIN).   oxyCODONE 5 MG immediate release tablet Commonly known as:  Oxy IR/ROXICODONE Take 1 tablet (5 mg total) by mouth every 6 (six) hours as needed for severe pain.   polyethylene  glycol packet Commonly known as:  MIRALAX / GLYCOLAX Take 17 g by mouth daily. Start taking on:  05/23/2016   potassium chloride SA 15 MEQ tablet Commonly known as:  KLOR-CON M15 Take 2 tablets (30 mEq total) by mouth daily. Start taking on:  05/23/2016   predniSONE 10 MG tablet Commonly known as:  DELTASONE Take 5 mg by mouth daily with breakfast.   promethazine 25 MG tablet Commonly known as:  PHENERGAN TAKE 1 TABLET DAILY AS NEEDED FOR NAUSEA   QUEtiapine 300 MG tablet Commonly known as:  SEROQUEL TAKE 1 TABLET AT BEDTIME   spironolactone 25 MG tablet Commonly known as:  ALDACTONE Take 1 tablet (25 mg total) by mouth daily.   thiamine 100 MG tablet Commonly known as:  VITAMIN B-1 Take 100 mg by mouth daily. Reported on 02/17/2015   TOPROL XL 25 MG 24 hr tablet Generic drug:  metoprolol succinate TAKE 1 TABLET DAILY   traZODone 50 MG tablet Commonly known as:  DESYREL TAKE 1 TABLET AT BEDTIME   valACYclovir 1000 MG tablet Commonly known as:  VALTREX Take 1 g by mouth 2 (two) times daily.   VITAMIN B COMPLEX PO Take 1 tablet  by mouth daily.       Contact information for follow-up providers    Lucille Passy, MD. Schedule an appointment as soon as possible for a visit in 1 week(s).   Specialty:  Family Medicine Contact information: [place] [address] [phone]            Contact information for after-discharge care    Destination    [facility] Follow up.   Specialty:  [place] information: [address] [place] [phone]                 Allergies  Allergen Reactions  . Nifedipine Swelling    REACTION: SWELLING, 11/02/2011 pt denies this allergy.    Consultations: GI Hematologist  Procedures/Studies:  CT chest 05/17/2016  CT head and CT C-spine 05/17/2016  Chest x-ray 05/17/2016  2 units packed red blood cells  05/18/2016  Subjective: Patient was seen and examined at bedside. Reported a stable left chest pain controlled with oral regimen. Denied fever, chills, headache, dizziness, nausea, vomiting,  shortness of breath. No abdominal pain.  Discharge Exam: Vitals:   05/22/16 0403 05/22/16 0950  BP: 108/67 112/67  Pulse: 86 85  Resp: 18 18  Temp: 98.3 F (36.8 C) 98.2 F (36.8 C)   Vitals:   05/21/16 1640 05/21/16 2014 05/22/16 0403 05/22/16 0950  BP: 113/62 118/77 108/67 112/67  Pulse: 75 76 86 85  Resp: 18 18 18 18   Temp: 97.8 F (36.6 C) 98.2 F (36.8 C) 98.3 F (36.8 C) 98.2 F (36.8 C)  TempSrc: Oral Oral Oral Oral  SpO2: 100% 100% 98% 100%  Weight:  84.1 kg (185 lb 8 oz)    Height:  5\' 9"  (1.753 m)      General: Pt is alert, awake, not in acute distress Cardiovascular: RRR, S1/S2 +, no rubs, no gallops Respiratory: CTA bilaterally, no wheezing, no rhonchi Abdominal: Soft, NT, ND, bowel sounds + Extremities: Trace edema, gradually improving     The results of significant diagnostics from this hospitalization (including imaging, microbiology, ancillary and laboratory) are listed below for reference.     Microbiology: Recent Results (from the past 240 hour(s))  MRSA PCR Screening     Status: Abnormal   Collection Time: 05/17/16 10:42 PM  Result Value Ref Range Status   MRSA by PCR POSITIVE (A) NEGATIVE Final    Comment:        The GeneXpert MRSA Assay (FDA approved for NASAL specimens only), is one component of a comprehensive MRSA colonization surveillance program. It is not intended to diagnose MRSA infection nor to guide or monitor treatment for MRSA infections. RESULT CALLED TO, READ BACK BY AND VERIFIED WITH: CALLED TO V.BROWN AT 0317 BY L.PITT 05/18/16      Labs: BNP (last 3 results)  Recent Labs  03/05/16 1812  BNP 93.7   Basic Metabolic Panel:  Recent Labs Lab 05/18/16 0451 05/18/16 0950 05/19/16 0506 05/20/16 0857 05/21/16 0537  05/22/16 0428  NA 124*  --  125* 126* 128* 129*  K 5.1  --  3.9 3.9 3.4* 3.6  CL 93*  --  92* 93* 96* 94*  CO2 22  --  24 24 27 28   GLUCOSE 99  --  124* 105* 82 98  BUN 39*  --  30* 25* 18 16  CREATININE 0.87  --  0.92 0.83 0.81 0.82  CALCIUM 7.9*  --  7.8* 8.2* 7.9* 7.9*  MG  --  1.7 2.5*  --  1.8  --   PHOS  --   --   --  2.6 2.3* 2.8   Liver Function Tests:  Recent Labs Lab 05/17/16 0934 05/18/16 0451 05/19/16 0506 05/20/16 0857 05/21/16 0537 05/22/16 0428  AST 32 34 24  --   --   --   ALT 31 24 26   --   --   --   ALKPHOS 56 40 54  --   --   --   BILITOT 1.1 1.5* 1.5*  --   --   --   PROT 5.4* 3.9* 4.5*  --   --   --   ALBUMIN 3.2* 2.4* 2.6* 2.7* 2.3* 2.3*    Recent Labs Lab 05/17/16 0934  LIPASE 26    Recent Labs Lab 05/20/16 0446  AMMONIA 18   CBC:  Recent Labs Lab 05/17/16 0934 05/18/16 0725 05/18/16 1820 05/19/16 0506 05/20/16 0446 05/21/16 0537 05/22/16 0428  WBC 17.3* 7.6  --  11.9* 9.4 6.6 7.0  NEUTROABS 13.5*  --   --  8.8*  --   --   --   HGB 8.2* 6.0* 8.0* 7.7* 6.5* 7.6* 7.8*  HCT 22.5* 16.6* 22.9* 21.3* 18.9* 22.4* 23.1*  MCV 88.2 88.8  --  87.3 87.9 87.8 89.5  PLT 217 134*  --  125* 128* 138* 164   Cardiac Enzymes:  Recent Labs Lab 05/17/16 0934 05/19/16 0743  TROPONINI <0.03 <0.03   BNP: Invalid input(s): POCBNP CBG:  Recent Labs Lab 05/18/16 0755 05/19/16 0805 05/20/16 0746 05/21/16 0808 05/22/16 0754  GLUCAP 114* 118* 110* 82 102*   D-Dimer No results for input(s): DDIMER in the last 72 hours. Hgb A1c No results for input(s): HGBA1C in the last 72 hours. Lipid Profile No results for input(s): CHOL, HDL, LDLCALC, TRIG, CHOLHDL, LDLDIRECT in the last 72 hours. Thyroid function studies No results for input(s): TSH, T4TOTAL, T3FREE, THYROIDAB in the last 72 hours.  Invalid input(s): FREET3 Anemia work up No results for input(s): VITAMINB12, FOLATE, FERRITIN, TIBC, IRON, RETICCTPCT in the last 72  hours. Urinalysis    Component Value Date/Time   COLORURINE YELLOW 05/17/2016 1416   APPEARANCEUR CLEAR 05/17/2016 1416   LABSPEC 1.017 05/17/2016 1416   PHURINE 7.0 05/17/2016 1416   GLUCOSEU NEGATIVE 05/17/2016 1416   HGBUR NEGATIVE 05/17/2016 1416   BILIRUBINUR NEGATIVE 05/17/2016 1416   BILIRUBINUR neg 11/17/2010 1210   KETONESUR NEGATIVE 05/17/2016 1416   PROTEINUR NEGATIVE 05/17/2016 1416   UROBILINOGEN 1.0 11/17/2011 1451   NITRITE NEGATIVE 05/17/2016 1416   LEUKOCYTESUR NEGATIVE 05/17/2016 1416   Sepsis Labs Invalid input(s): PROCALCITONIN,  WBC,  LACTICIDVEN Microbiology Recent Results (from the past 240 hour(s))  MRSA PCR Screening     Status: Abnormal   Collection Time: 05/17/16 10:42 PM  Result Value Ref Range Status   MRSA by PCR POSITIVE (A) NEGATIVE Final    Comment:        The GeneXpert MRSA Assay (FDA approved for NASAL specimens only), is one component of a comprehensive MRSA colonization surveillance program. It is not intended to diagnose MRSA infection nor to guide or monitor treatment for MRSA infections. RESULT CALLED TO, READ BACK BY AND VERIFIED WITH: CALLED TO V.BROWN AT 0317 BY L.PITT 05/18/16      Time coordinating discharge: 35 minutes  SIGNED:   Rosita Fire, MD  [facility] 05/22/2016, 11:22 AM  If 7PM-7AM, please contact night-coverage [email or web address] Password [email or web address]

## 2016-05-24 ENCOUNTER — Encounter: Payer: Self-pay | Admitting: Internal Medicine

## 2016-05-24 ENCOUNTER — Non-Acute Institutional Stay (SKILLED_NURSING_FACILITY): Payer: Medicare Other | Admitting: Internal Medicine

## 2016-05-24 DIAGNOSIS — R600 Localized edema: Secondary | ICD-10-CM

## 2016-05-24 DIAGNOSIS — S2242XS Multiple fractures of ribs, left side, sequela: Secondary | ICD-10-CM

## 2016-05-24 DIAGNOSIS — R531 Weakness: Secondary | ICD-10-CM

## 2016-05-24 DIAGNOSIS — F1029 Alcohol dependence with unspecified alcohol-induced disorder: Secondary | ICD-10-CM

## 2016-05-24 DIAGNOSIS — F1027 Alcohol dependence with alcohol-induced persisting dementia: Secondary | ICD-10-CM

## 2016-05-24 DIAGNOSIS — I420 Dilated cardiomyopathy: Secondary | ICD-10-CM

## 2016-05-24 DIAGNOSIS — I10 Essential (primary) hypertension: Secondary | ICD-10-CM

## 2016-05-24 DIAGNOSIS — L120 Bullous pemphigoid: Secondary | ICD-10-CM

## 2016-05-24 DIAGNOSIS — E871 Hypo-osmolality and hyponatremia: Secondary | ICD-10-CM

## 2016-05-24 DIAGNOSIS — F329 Major depressive disorder, single episode, unspecified: Secondary | ICD-10-CM

## 2016-05-24 DIAGNOSIS — F32A Depression, unspecified: Secondary | ICD-10-CM

## 2016-05-24 DIAGNOSIS — K5900 Constipation, unspecified: Secondary | ICD-10-CM

## 2016-05-24 DIAGNOSIS — Z9181 History of falling: Secondary | ICD-10-CM | POA: Diagnosis not present

## 2016-05-24 DIAGNOSIS — D62 Acute posthemorrhagic anemia: Secondary | ICD-10-CM | POA: Diagnosis not present

## 2016-05-24 DIAGNOSIS — E440 Moderate protein-calorie malnutrition: Secondary | ICD-10-CM

## 2016-05-24 NOTE — Progress Notes (Signed)
LOCATION: [facility]  PCP: Lucille Passy, MD   Code Status: DNR  Goals of care: Advanced Directive information Advanced Directives 05/24/2016  Does Patient Have a Medical Advance Directive? Yes  Type of Advance Directive Out of facility DNR (pink MOST or yellow form)  Does patient want to make changes to medical advance directive? No - Patient declined  Copy of [place] in Chart? -  Would patient like information on creating a medical advance directive? -  Pre-existing out of facility DNR order (yellow form or pink MOST form) -       Extended Emergency Contact Information Primary Emergency Contact: Meroney,[name] Address: [address]          [place], [place] [address] [place] Work Phone: [phone] Mobile Phone: [phone] Relation: Spouse Secondary Emergency Contact: [place] [employer] Phone: [phone] Relation: Relative   Allergies  Allergen Reactions  . Nifedipine Swelling    REACTION: SWELLING, 11/02/2011 pt denies this allergy.    Chief Complaint  Patient presents with  . New Admit To SNF    New Admission Visit      HPI:  Patient is a 71 y.o. male seen today for short term rehabilitation post hospital admission from 05/17/16-05/22/16 post fall with near syncope episode. He sustained multiple left rib fracture. He had hyponatremia and drop in hemoglobin. He was seen by GI and hematology for his anemia. Hemolysis was ruled out. He received blood transfusion. Anemia workup was unrevealing. Ct head and abdomen were negative for bleed and acute abnormalities. He has PMH of alcohol use, memory loss, frequent falls, PVD, CAD, parkinson's syndrome among others. He is seen in his room today.   Review of Systems:  Constitutional: Negative for fever, chills, diaphoresis. Energy level is fair.  HENT: Negative for headache, congestion, sore throat, difficulty swallowing. Positive for occasional  nasal discharge.   Eyes: Negative for eye pain, blurred vision, double vision and discharge.  Respiratory: Negative for shortness of breath and wheezing. Positive for dry cough.  Cardiovascular: Negative for palpitations, leg swelling. Positive for left sided chest pain. Gastrointestinal: Negative for heartburn, nausea, vomiting, abdominal pain, loss of appetite, melena, diarrhea and constipation. Last bowel movement was this morning with black colored stool.  Genitourinary: Negative for dysuria.  Musculoskeletal: Negative for back pain, fall in the facility. Positive for pain to left foot.  Skin: Negative for itching, rash.  Neurological: Positive for occasional dizziness with change of position. Psychiatric/Behavioral: Negative for depression   Past Medical History:  Diagnosis Date  . Adenomatous polyp of colon 2006  . Anemia 2012  . Anxiety   . Arthritis    "left arm/shoulder" (05/17/2016)  . Atrial tachycardia, paroxysmal ([place]) 04/07/2015  . BPH (benign prostatic hypertrophy) 5/99  . Chronic bronchitis ([place])    "q year or q other year" (11/02/2011)  . Chronic nausea    "a few days/week" (05/17/2016)  . Colon polyp   . DCM (dilated cardiomyopathy) ([place])    EF 45-50% by echo and cath 2017  . Depression    Hx of  . Falls frequently    "daily lately; legs just give out" (11/02/2011; 05/17/2016)  . GERD (gastroesophageal reflux disease) 07/31/04   gastritis   . H/O hiatal hernia   . Hard of hearing   . Heart murmur   . Hernia, umbilical    "unrepaired" (11/02/2011; 05/17/2016)  . History of blood transfusion 2012  . HLD (hyperlipidemia)  5/99  . HTN (hypertension) 5/99  . Memory loss    "recently has been forgetting alot; maybe related to the alcohol" (11/02/2011; 05/17/2016)  . Mild CAD    10% RCA  . PAC (premature atrial contraction) 04/07/2015  . Peripheral vascular disease ([place])   . Pneumonia 1996   hosp  . Pollen allergy    pollen/ragweed  . Seasonal allergies    Past  Surgical History:  Procedure Laterality Date  . BACK SURGERY    . CARDIAC CATHETERIZATION N/A 05/23/2015   Procedure: Left Heart Cath and Coronary Angiography;  Surgeon: Troy Sine, MD;  Location: [facility];  Service: Cardiovascular;  Laterality: N/A;  . CATARACT EXTRACTION W/ INTRAOCULAR LENS  IMPLANT, BILATERAL Bilateral 10/2002  . COLONOSCOPY  07/31/04   multiple polyps; repeat in 3 years  . DUPUYTREN CONTRACTURE RELEASE Left   . ETT myoview  03/09/07   nml EF 66%  . FRACTURE SURGERY    . HEMORRHOID SURGERY     "cauterized a long time ago" (11/02/2011)  . hosp CP R/O'D 2/24  03/08/07  . neck MRI  6/04   C5-6 disc abnormality  . ORIF HUMERUS FRACTURE  11/04/2011   Procedure: OPEN REDUCTION INTERNAL FIXATION (ORIF) PROXIMAL HUMERUS FRACTURE;  Surgeon: Rozanna Box, MD;  Location: [facility];  Service: Orthopedics;  Laterality: Left;  . POSTERIOR CERVICAL FUSION/FORAMINOTOMY N/A 03/21/2012   Procedure: POSTERIOR CERVICAL FUSION/FORAMINOTOMY LEVEL ONE;  Surgeon: Charlie Pitter, MD;  Location: [facility];  Service: Neurosurgery;  Laterality: N/A;  POSTERIOR CERVICAL ONE TO CERVICAL TWO FUSION WITH ILIAC CREST GRAFT AND LATERAL MASS SCREWS   . TONSILLECTOMY     "I was young" (11/02/2011)  . UPPER GASTROINTESTINAL ENDOSCOPY     Social History:   reports that he has quit smoking. His smoking use included Pipe. He has never used smokeless tobacco. He reports that he drinks about 67.2 oz of alcohol per week . He reports that he does not use drugs.  Family History  Problem Relation Age of Onset  . Heart disease Father   . Diabetes Father   . Stroke Father   . Depression Mother   . Heart disease Brother   . Alcohol abuse Brother   . Liver disease Brother     Medications: Allergies as of 05/24/2016      Reactions   Nifedipine Swelling   REACTION: SWELLING, 11/02/2011 pt denies this allergy.      Medication List       Accurate as of 05/24/16 12:16 PM. Always use your most  recent med list.          cetirizine 10 MG tablet Commonly known as:  ZYRTEC Take 10 mg by mouth daily.   feeding supplement (ENSURE ENLIVE) Liqd Take 237 mLs by mouth 2 (two) times daily between meals.   furosemide 40 MG tablet Commonly known as:  LASIX Take 1 tablet (40 mg total) by mouth daily.   lidocaine 5 % Commonly known as:  LIDODERM Place 1 patch onto the skin daily. Remove & Discard patch within 12 hours or as directed by MD   magnesium oxide 400 MG tablet Commonly known as:  MAG-OX Take 400 mg by mouth daily as needed.   mupirocin ointment 2 % Commonly known as:  BACTROBAN Apply 1 application topically daily. Left foot   naproxen sodium 220 MG tablet Commonly known as:  ANAPROX Take 220 mg by mouth 2 (two) times daily as needed (PAIN).  oxyCODONE 5 MG immediate release tablet Commonly known as:  Oxy IR/ROXICODONE Take 1 tablet (5 mg total) by mouth every 6 (six) hours as needed for severe pain.   polyethylene glycol packet Commonly known as:  MIRALAX / GLYCOLAX Take 17 g by mouth daily.   potassium chloride SA 15 MEQ tablet Commonly known as:  KLOR-CON M15 Take 2 tablets (30 mEq total) by mouth daily.   predniSONE 10 MG tablet Commonly known as:  DELTASONE Take 5 mg by mouth daily with breakfast.   promethazine 25 MG tablet Commonly known as:  PHENERGAN TAKE 1 TABLET DAILY AS NEEDED FOR NAUSEA   QUEtiapine 300 MG tablet Commonly known as:  SEROQUEL TAKE 1 TABLET AT BEDTIME   spironolactone 25 MG tablet Commonly known as:  ALDACTONE Take 1 tablet (25 mg total) by mouth daily.   thiamine 100 MG tablet Commonly known as:  VITAMIN B-1 Take 100 mg by mouth daily. Reported on 02/17/2015   TOPROL XL 25 MG 24 hr tablet Generic drug:  metoprolol succinate TAKE 1 TABLET DAILY   traZODone 50 MG tablet Commonly known as:  DESYREL TAKE 1 TABLET AT BEDTIME   valACYclovir 1000 MG tablet Commonly known as:  VALTREX Take 1 g by mouth 2 (two) times  daily.   VITAMIN B COMPLEX PO Take 1 tablet by mouth daily.       Immunizations: Immunization History  Administered Date(s) Administered  . Influenza Whole 10/20/2000, 10/17/2008, 10/09/2009  . Influenza,inj,Quad PF,36+ Mos 10/04/2013, 10/24/2014, 09/09/2015  . PPD Test 05/22/2016  . Pneumococcal Conjugate-13 10/04/2013  . Pneumococcal Polysaccharide-23 11/18/1999, 11/05/2015  . Td 02/22/2003  . Tdap 07/17/2012  . Zoster 05/12/2012     Physical Exam: Vitals:   05/24/16 1201  BP: 130/70  Pulse: 80  Resp: 18  Temp: 98.5 F (36.9 C)  TempSrc: Oral  SpO2: 97%  Weight: 185 lb 8 oz (84.1 kg)  Height: 5\' 9"  (1.753 m)   Body mass index is 27.39 kg/m.  General- elderly male, well built, in no acute distress Head- normocephalic, atraumatic Nose- no maxillary or frontal sinus tenderness, no nasal discharge Throat- moist mucus membrane, normal oropharynx, halitosis + Eyes- PERRLA, EOMI, no pallor, no icterus, no discharge, normal conjunctiva, normal sclera Neck- no cervical lymphadenopathy Cardiovascular- normal s1,s2, no murmur Respiratory- bilateral clear to auscultation, no wheeze, no rhonchi, no crackles, no use of accessory muscles Abdomen- bowel sounds present, soft, non tender, no guarding or rigidity Musculoskeletal- able to move all 4 extremities, limited left shoulder ROM, left chest wall tenderness, 1+ pitting leg edema Neurological- alert and oriented to person, place and time Skin- warm and dry, dressing to left foot and left elbow Psychiatry- normal mood and affect    Labs reviewed: Basic Metabolic Panel:  Recent Labs  05/18/16 0950 05/19/16 0506 05/20/16 0857 05/21/16 0537 05/22/16 0428  NA  --  125* 126* 128* 129*  K  --  3.9 3.9 3.4* 3.6  CL  --  92* 93* 96* 94*  CO2  --  24 24 27 28   GLUCOSE  --  124* 105* 82 98  BUN  --  30* 25* 18 16  CREATININE  --  0.92 0.83 0.81 0.82  CALCIUM  --  7.8* 8.2* 7.9* 7.9*  MG 1.7 2.5*  --  1.8  --   PHOS   --   --  2.6 2.3* 2.8   Liver Function Tests:  Recent Labs  05/17/16 7353 05/18/16 0451 05/19/16 0506 05/20/16 2992  05/21/16 0537 05/22/16 0428  AST 32 34 24  --   --   --   ALT 31 24 26   --   --   --   ALKPHOS 56 40 54  --   --   --   BILITOT 1.1 1.5* 1.5*  --   --   --   PROT 5.4* 3.9* 4.5*  --   --   --   ALBUMIN 3.2* 2.4* 2.6* 2.7* 2.3* 2.3*    Recent Labs  03/05/16 1812 05/17/16 0934  LIPASE 43 26    Recent Labs  03/05/16 1812 05/20/16 0446  AMMONIA 11 18   CBC:  Recent Labs  12/09/15 0937  05/17/16 0934  05/19/16 0506 05/20/16 0446 05/21/16 0537 05/22/16 0428  WBC 6.9  < > 17.3*  < > 11.9* 9.4 6.6 7.0  NEUTROABS 4.8  --  13.5*  --  8.8*  --   --   --   HGB 12.0*  < > 8.2*  < > 7.7* 6.5* 7.6* 7.8*  HCT 35.4*  < > 22.5*  < > 21.3* 18.9* 22.4* 23.1*  MCV 92.7  < > 88.2  < > 87.3 87.9 87.8 89.5  PLT 151.0  < > 217  < > 125* 128* 138* 164  < > = values in this interval not displayed. Cardiac Enzymes:  Recent Labs  05/17/16 0934 05/19/16 0743  TROPONINI <0.03 <0.03   BNP: Invalid input(s): POCBNP CBG:  Recent Labs  05/20/16 0746 05/21/16 0808 05/22/16 0754  GLUCAP 110* 82 102*    Radiological Exams: Dg Chest 2 View  Result Date: 05/20/2016 CLINICAL DATA:  Recurrent falls, pt has a fall from his recliner yesterday with left sided rib pain EXAM: CHEST - 2 VIEW COMPARISON:  05/17/2016 FINDINGS: Multiple left rib fractures as before. Further displacement of the anterolateral fractures of left ribs 4 and 5. Lateral and apical pleural thickening as before probably related to hematoma. No pneumothorax. Lungs are clear. Heart size upper limits normal.  Tortuous thoracic aorta. Small left pleural effusion. Compression deformity in the upper lumbar spine as before. Fixation hardware in the proximal left humerus, incompletely visualized. IMPRESSION: 1. Left rib fractures and upper lumbar compression deformity. 2. Small left pleural effusion.  No  pneumothorax. Electronically Signed   By: Lucrezia Europe M.D.   On: 05/20/2016 09:42   Dg Chest 2 View  Result Date: 05/17/2016 CLINICAL DATA:  Syncopal episode.  Fall with pain in the left ribs. EXAM: CHEST  2 VIEW COMPARISON:  CT of the chest 03/05/2016 and two-view chest x-ray 05/16/2015. FINDINGS: The heart is enlarged. There is no edema or effusion to suggest failure. Lung volumes are low. Posterior left second, third, and fourth rib fractures are suspected. Asymmetric pleural thickening or fluid is noted at the left apex. IMPRESSION: 1. Suspected left posterior second, third, and fourth rib fractures. Recommend CT of the chest without contrast for further evaluation. 2. Asymmetric left apical pleural thickening or fluid likely related to the acute fractures. Electronically Signed   By: San Morelle M.D.   On: 05/17/2016 10:15   Ct Head Wo Contrast  Result Date: 05/17/2016 CLINICAL DATA:  Status post fall. Unknown if the loss consciousness. Syncope. EXAM: CT HEAD WITHOUT CONTRAST CT CERVICAL SPINE WITHOUT CONTRAST TECHNIQUE: Multidetector CT imaging of the head and cervical spine was performed following the standard protocol without intravenous contrast. Multiplanar CT image reconstructions of the cervical spine were also generated. COMPARISON:  03/05/2016 FINDINGS: CT  HEAD FINDINGS Brain: No evidence of acute infarction, hemorrhage, extra-axial collection, ventriculomegaly, or mass effect. Generalized cerebral atrophy. Periventricular white matter low attenuation likely secondary to microangiopathy. Vascular: Cerebrovascular atherosclerotic calcifications are noted. Skull: Negative for fracture or focal lesion. Sinuses/Orbits: Visualized portions of the orbits are unremarkable. Visualized portions of the paranasal sinuses and mastoid air cells are unremarkable. Other: None. CT CERVICAL SPINE FINDINGS Alignment: Normal. Skull base and vertebrae: No acute fracture. No primary bone lesion or focal  pathologic process. Ununited type 1 fracture of the odontoid process with posterior fusion of C2-3 with bilateral pedicle screws and a cerclage wire. Soft tissues and spinal canal: No prevertebral fluid or swelling. No visible canal hematoma. Disc levels: Degenerative disc disease with mild disc height loss at C5-6. Posterior spinal fusion at C2-3 with moderate left facet arthropathy and left foraminal stenosis. No hardware failure or complication. Moderate bilateral facet arthropathy at C3-4 and C4-5. Bilateral foraminal narrowing at C4-5. Broad-based disc osteophyte complex at C5-6 with bilateral uncovertebral degenerative changes and moderate bilateral foraminal stenosis. Other: No fluid collection or hematoma. Bilateral carotid artery atherosclerosis. IMPRESSION: 1. No acute intracranial pathology. 2. No acute osseous injury the cervical spine. 3. Cervical spine spondylosis as described above. 4. Chronic ununited type 1 fracture of the odontoid process with posterior fusion at C2-3 without failure or complication. Electronically Signed   By: Kathreen Devoid   On: 05/17/2016 11:18   Ct Chest Wo Contrast  Result Date: 05/17/2016 CLINICAL DATA:  Fall, syncope EXAM: CT CHEST WITHOUT CONTRAST TECHNIQUE: Multidetector CT imaging of the chest was performed following the standard protocol without IV contrast. COMPARISON:  Chest CT 03/05/2016.  Chest x-ray earlier today. FINDINGS: Cardiovascular: Heart is borderline in size. Extensive coronary artery and aortic calcifications. No evidence of aortic aneurysm. Mediastinum/Nodes: No mediastinal, hilar, or axillary adenopathy. Trachea and esophagus are unremarkable. Lungs/Pleura: No confluent airspace opacities or pleural effusions. No pneumothorax. Upper Abdomen: Layering gallstones within the gallbladder. No acute findings. Musculoskeletal: Bilateral gynecomastia. Multiple left rib fractures are noted involving the left second through eighth ribs. The second and third rib  fractures are comminuted. There is no associated pneumothorax. Overlying pleural or extrapleural soft tissue thickening noted in the lateral left apex. Multiple old healed posterior right rib fractures. IMPRESSION: Left second through eighth rib fractures posterolaterally. No associated left effusion or pneumothorax. Cardiomegaly. Coronary artery disease, aortic atherosclerosis. Bilateral gynecomastia. Electronically Signed   By: Rolm Baptise M.D.   On: 05/17/2016 11:11   Ct Cervical Spine Wo Contrast  Result Date: 05/17/2016 CLINICAL DATA:  Status post fall. Unknown if the loss consciousness. Syncope. EXAM: CT HEAD WITHOUT CONTRAST CT CERVICAL SPINE WITHOUT CONTRAST TECHNIQUE: Multidetector CT imaging of the head and cervical spine was performed following the standard protocol without intravenous contrast. Multiplanar CT image reconstructions of the cervical spine were also generated. COMPARISON:  03/05/2016 FINDINGS: CT HEAD FINDINGS Brain: No evidence of acute infarction, hemorrhage, extra-axial collection, ventriculomegaly, or mass effect. Generalized cerebral atrophy. Periventricular white matter low attenuation likely secondary to microangiopathy. Vascular: Cerebrovascular atherosclerotic calcifications are noted. Skull: Negative for fracture or focal lesion. Sinuses/Orbits: Visualized portions of the orbits are unremarkable. Visualized portions of the paranasal sinuses and mastoid air cells are unremarkable. Other: None. CT CERVICAL SPINE FINDINGS Alignment: Normal. Skull base and vertebrae: No acute fracture. No primary bone lesion or focal pathologic process. Ununited type 1 fracture of the odontoid process with posterior fusion of C2-3 with bilateral pedicle screws and a cerclage wire. Soft tissues and spinal  canal: No prevertebral fluid or swelling. No visible canal hematoma. Disc levels: Degenerative disc disease with mild disc height loss at C5-6. Posterior spinal fusion at C2-3 with moderate left  facet arthropathy and left foraminal stenosis. No hardware failure or complication. Moderate bilateral facet arthropathy at C3-4 and C4-5. Bilateral foraminal narrowing at C4-5. Broad-based disc osteophyte complex at C5-6 with bilateral uncovertebral degenerative changes and moderate bilateral foraminal stenosis. Other: No fluid collection or hematoma. Bilateral carotid artery atherosclerosis. IMPRESSION: 1. No acute intracranial pathology. 2. No acute osseous injury the cervical spine. 3. Cervical spine spondylosis as described above. 4. Chronic ununited type 1 fracture of the odontoid process with posterior fusion at C2-3 without failure or complication. Electronically Signed   By: Kathreen Devoid   On: 05/17/2016 11:18   Ct Abdomen Pelvis W Contrast  Result Date: 05/18/2016 CLINICAL DATA:  Anemia. EXAM: CT ABDOMEN AND PELVIS WITH CONTRAST TECHNIQUE: Multidetector CT imaging of the abdomen and pelvis was performed using the standard protocol following bolus administration of intravenous contrast. CONTRAST:  159mL ISOVUE-300 IOPAMIDOL (ISOVUE-300) INJECTION 61% COMPARISON:  Abdominal CT 06/23/2009.  Chest CT yesterday. FINDINGS: Lower chest: Small left pleural effusion and adjacent atelectasis, new from chest CT yesterday. Mild cardiomegaly. Hepatobiliary: Mild diffuse decreased hepatic density consistent with steatosis. No focal hepatic lesion. Gallbladder is distended with layering hyperdensity, possibly sludge or small stones. No CT findings of wall thickening or pericholecystic inflammation. No biliary dilatation. Pancreas: No ductal dilatation or inflammation. Spleen: Normal in size without focal abnormality. Adrenals/Urinary Tract: No adrenal nodule. No hydronephrosis. Duplicated left renal collecting system. There is mild right renal atrophy. Symmetric enhancement and excretion on delayed phase imaging. Mild symmetric nonspecific perinephric edema appears similar to prior exam. Small exophytic cyst from the  lower left kidney. Urinary bladder is physiologically distended, no wall thickening. Stomach/Bowel: Stomach physiologically distended. No evidence of bowel wall thickening, distention or inflammation. Moderate stool burden with tortuosity of the hepatic flexure. Small to moderate diverticulosis of the distal descending and sigmoid colon without acute inflammation. Normal appendix. Vascular/Lymphatic: Aortic atherosclerosis without aneurysm. No abdominal or pelvic adenopathy. Reproductive: Prostate is unremarkable. Other: Soft tissue edema about the left lateral abdominal wall contains small foci of air, may be secondary to IV placement. Tiny air within the right inferior epigastric vessels likely secondary to IV placement. Small to moderate-sized fat containing umbilical hernia. There is fat in the left inguinal canal. Mild dependent body wall edema. No ascites. Musculoskeletal: Acute posterior left rib fractures as characterized on recent chest CT. Mild compression deformity superior endplate of the L1, appears remote. Mild chronic change about both hips. IMPRESSION: 1. Colonic diverticulosis without acute inflammation. 2. Moderate stool burden with colonic tortuosity, suggesting constipation. 3. Hepatic steatosis. 4. Gallbladder distention containing narrowing high density, likely combination of small stones and sludge. No pericholecystic inflammation or wall thickening. 5. Fat containing umbilical hernia. 6. Small left pleural effusion and adjacent atelectasis, new from chest CT yesterday. Acute left rib fractures are partially included. 7. Aortic atherosclerosis. Electronically Signed   By: Jeb Levering M.D.   On: 05/18/2016 23:20    Assessment/Plan  Generalized weakness From deconditioning. Will have patient work with PT/OT as tolerated to regain strength and restore function.  Fall precautions are in place.  Acute anemia No active bleed noted. Seen by GI and hematology in hospital. Hemolysis was  ruled out. Acute gi bleed and intra-abdominal bleed/ retroperitoneal bleed ruled out. Colonoscopy and EGD in 2017 normal. Him being on chronic antibiotic and  prednisone along with his chronic alcoholism could be contributing to this with bone marrow suppression. Check cbc periodically. Make hematology follow up if needed.  Left rib fracture Multiple rib fracture. Currently on prn naproxen and prn oxycodone. With his anemia, discontinue naproxen. PMR consult.   High fall risk Multiple fall at home. Fall precautions for now. He remains a high fall risk. Will have him work with physical therapy and occupational therapy team to help with gait training and muscle strengthening exercises.fall precautions. Skin care. Encourage to be out of bed.   Bullous pemphigoid Make f/u with Dr [name] Crane dermatology. Continue mupirocin, skin care and prednisone 10 mg daily. Resume his doxycycline 100 mg bid for now until dermatology f/u for further recs  Stress ulcer prophylaxis With acute anemia of unclear etiology and him on prednisone, place him on protonix 20 mg daily for now.   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 Supportive care. Alcohol abstainence.   Alcohol dependence No active withdrawal at present. counselled on alcohol cessation. Continue thiamine supplement. Fall precautions  Hyponatremia Monitor bmp  Dilated cardiomyopathy Continue spironolactone with toprol and lasix. Check bmp. Monitor his weight  HTN Monitor BP, continue lasix with toprol and spironolactone, check bmp  Leg edema From PVD and protein calorie malnutrition, keep legs elevated at rest, continue lasix and spironolactone. Continue kcl.   Protein calorie malnutrition Will need protein supplement. RD consult and weight monitoring,     Goals of care: short term rehabilitation, possible long term care.    Labs/tests ordered:  cbc, cmp 05/25/16  Family/ staff Communication: reviewed care plan with patient, his wife over the phone and nursing supervisor    Blanchie Serve, MD Internal Medicine [address], [place] [address] Cell Phone (Monday-Friday 8 am - 5 pm): [phone] On Call: [phone] and follow prompts after 5 pm and on weekends Office Phone: [phone] Office Fax: [phone]

## 2016-05-25 LAB — BASIC METABOLIC PANEL
BUN: 15 mg/dL (ref 4–21)
Creatinine: 0.8 mg/dL (ref 0.6–1.3)
GLUCOSE: 87 mg/dL
Potassium: 3.6 mmol/L (ref 3.4–5.3)
SODIUM: 139 mmol/L (ref 137–147)

## 2016-05-25 LAB — CBC AND DIFFERENTIAL
HEMATOCRIT: 27 % — AB (ref 41–53)
HEMOGLOBIN: 8.8 g/dL — AB (ref 13.5–17.5)
Platelets: 246 10*3/uL (ref 150–399)
WBC: 6.3 10^3/mL

## 2016-05-25 LAB — HEPATIC FUNCTION PANEL
ALT: 15 U/L (ref 10–40)
AST: 15 U/L (ref 14–40)
Alkaline Phosphatase: 74 U/L (ref 25–125)
BILIRUBIN, TOTAL: 0.6 mg/dL

## 2016-05-26 ENCOUNTER — Non-Acute Institutional Stay (SKILLED_NURSING_FACILITY): Payer: Medicare Other | Admitting: Family

## 2016-05-26 ENCOUNTER — Encounter: Payer: Self-pay | Admitting: Family

## 2016-05-26 DIAGNOSIS — E8809 Other disorders of plasma-protein metabolism, not elsewhere classified: Secondary | ICD-10-CM

## 2016-05-26 DIAGNOSIS — E440 Moderate protein-calorie malnutrition: Secondary | ICD-10-CM

## 2016-05-26 DIAGNOSIS — Z9181 History of falling: Secondary | ICD-10-CM | POA: Diagnosis not present

## 2016-05-26 DIAGNOSIS — R079 Chest pain, unspecified: Secondary | ICD-10-CM | POA: Diagnosis not present

## 2016-05-26 DIAGNOSIS — D509 Iron deficiency anemia, unspecified: Secondary | ICD-10-CM

## 2016-05-26 MED ORDER — FERROUS SULFATE 325 (65 FE) MG PO TABS: 325.0000 mg | ORAL_TABLET | Freq: Every day | ORAL | Status: DC

## 2016-05-26 MED ORDER — DOCUSATE SODIUM 100 MG PO CAPS: 100.0000 mg | ORAL_CAPSULE | Freq: Every day | ORAL | 0 refills | Status: DC | PRN

## 2016-05-26 NOTE — Progress Notes (Signed)
Location:  [place] Room Number: [phone] Place of Service:  SNF [phone]) Provider: Dinah Ngetich FNP-C  Lucille Passy, MD  Patient Care Team: Lucille Passy, MD as PCP - Thomes Dinning, MD as Consulting Physician (Orthopedic Surgery) Earnie Larsson, MD as Consulting Physician (Neurosurgery) Owens Loffler, MD as Consulting Physician (Family Medicine) Pyrtle, Lajuan Lines, MD as Consulting Physician (Gastroenterology) Thelma Comp, [place] as Consulting Physician [facility])  Extended Emergency Contact Information Primary Emergency Contact: [facility] Address: [address] [place]          [facility] [place], [place] [address] Johnnette Litter of [place] Work Phone: [phone] Mobile Phone: [phone] Relation: Spouse Secondary Emergency Contact: [place] Phone: [phone] Relation: Relative  Code Status:  DNR  Goals of care: Advanced Directive information Advanced Directives 05/26/2016  Does Patient Have a Medical Advance Directive? Yes  Type of Advance Directive Out of facility DNR (pink MOST or yellow form)  Does patient want to make changes to medical advance directive? -  Copy of [place] in Chart? -  Would patient like information on creating a medical advance directive? -  Pre-existing out of facility DNR order (yellow form or pink MOST form) Yellow form placed in chart (order not valid for inpatient use)     Chief Complaint  Patient presents with  . Acute Visit    abnormal labs    HPI:  Pt is a 71 y.o. male seen today at [facility] for an acute visit for evaluation of abnormal lab results. He is seen in her room today.  He denies any acute issues this visit. He states left rib cage pain under control with current regimen. His recent lab results Hgb 8.8, HCT 26.9, TP 4.8, ALB 2.98 (05/25/2016). Of note he is status post Hospital admission for fall with near syncope. He  received blood transfusion.Previous Hgb was 7.8 ( 05/22/2016). He denies any dizziness, Headache  visual changes or signs of bleeding.    Past Medical History:  Diagnosis Date  . Adenomatous polyp of colon 2006  . Anemia 2012  . Anxiety   . Arthritis    "left arm/shoulder" (05/17/2016)  . Atrial tachycardia, paroxysmal ([place]) 04/07/2015  . BPH (benign prostatic hypertrophy) 5/99  . Chronic bronchitis ([place])    "q year or q other year" (11/02/2011)  . Chronic nausea    "a few days/week" (05/17/2016)  . Colon polyp   . DCM (dilated cardiomyopathy) ([place])    EF 45-50% by echo and cath 2017  . Depression    Hx of  . Falls frequently    "daily lately; legs just give out" (11/02/2011; 05/17/2016)  . GERD (gastroesophageal reflux disease) 07/31/04   gastritis   . H/O hiatal hernia   . Hard of hearing   . Heart murmur   . Hernia, umbilical    "unrepaired" (11/02/2011; 05/17/2016)  . History of blood transfusion 2012  . HLD (hyperlipidemia) 5/99  . HTN (hypertension) 5/99  . Memory loss    "recently has been forgetting alot; maybe related to the alcohol" (11/02/2011; 05/17/2016)  . Mild CAD    10% RCA  . PAC (premature atrial contraction) 04/07/2015  . Peripheral vascular disease ([place])   . Pneumonia 1996   hosp  . Pollen allergy    pollen/ragweed  . Seasonal allergies    Past Surgical History:  Procedure Laterality Date  . BACK SURGERY    . CARDIAC CATHETERIZATION  N/A 05/23/2015   Procedure: Left Heart Cath and Coronary Angiography;  Surgeon: Troy Sine, MD;  Location: [facility];  Service: Cardiovascular;  Laterality: N/A;  . CATARACT EXTRACTION W/ INTRAOCULAR LENS  IMPLANT, BILATERAL Bilateral 10/2002  . COLONOSCOPY  07/31/04   multiple polyps; repeat in 3 years  . DUPUYTREN CONTRACTURE RELEASE Left   . ETT myoview  03/09/07   nml EF 66%  . FRACTURE SURGERY    . HEMORRHOID SURGERY     "cauterized a long time ago" (11/02/2011)  . hosp CP R/O'D 2/24  03/08/07  . neck MRI   6/04   C5-6 disc abnormality  . ORIF HUMERUS FRACTURE  11/04/2011   Procedure: OPEN REDUCTION INTERNAL FIXATION (ORIF) PROXIMAL HUMERUS FRACTURE;  Surgeon: Rozanna Box, MD;  Location: [facility];  Service: Orthopedics;  Laterality: Left;  . POSTERIOR CERVICAL FUSION/FORAMINOTOMY N/A 03/21/2012   Procedure: POSTERIOR CERVICAL FUSION/FORAMINOTOMY LEVEL ONE;  Surgeon: Charlie Pitter, MD;  Location: [facility];  Service: Neurosurgery;  Laterality: N/A;  POSTERIOR CERVICAL ONE TO CERVICAL TWO FUSION WITH ILIAC CREST GRAFT AND LATERAL MASS SCREWS   . TONSILLECTOMY     "I was young" (11/02/2011)  . UPPER GASTROINTESTINAL ENDOSCOPY      Allergies  Allergen Reactions  . Nifedipine Swelling    REACTION: SWELLING, 11/02/2011 pt denies this allergy.    Allergies as of 05/26/2016      Reactions   Nifedipine Swelling   REACTION: SWELLING, 11/02/2011 pt denies this allergy.      Medication List       Accurate as of 05/26/16 12:59 PM. Always use your most recent med list.          cetirizine 10 MG tablet Commonly known as:  ZYRTEC Take 10 mg by mouth daily.   doxycycline 100 MG capsule Commonly known as:  VIBRAMYCIN Take 100 mg by mouth 2 (two) times daily.   furosemide 40 MG tablet Commonly known as:  LASIX Take 1 tablet (40 mg total) by mouth daily.   lidocaine 5 % Commonly known as:  LIDODERM Place 1 patch onto the skin daily. Remove & Discard patch within 12 hours or as directed by MD   magnesium oxide 400 MG tablet Commonly known as:  MAG-OX Take 400 mg by mouth daily as needed.   mupirocin ointment 2 % Commonly known as:  BACTROBAN Apply 1 application topically daily. Left foot   oxyCODONE 5 MG immediate release tablet Commonly known as:  Oxy IR/ROXICODONE Take 1 tablet (5 mg total) by mouth every 6 (six) hours as needed for severe pain.   pantoprazole 20 MG tablet Commonly known as:  PROTONIX Take 20 mg by mouth 2 (two) times daily.   polyethylene glycol  packet Commonly known as:  MIRALAX / GLYCOLAX Take 17 g by mouth daily.   potassium chloride SA 15 MEQ tablet Commonly known as:  KLOR-CON M15 Take 2 tablets (30 mEq total) by mouth daily.   predniSONE 10 MG tablet Commonly known as:  DELTASONE Take 5 mg by mouth daily with breakfast.   promethazine 25 MG tablet Commonly known as:  PHENERGAN TAKE 1 TABLET DAILY AS NEEDED FOR NAUSEA   QUEtiapine 300 MG tablet Commonly known as:  SEROQUEL TAKE 1 TABLET AT BEDTIME   spironolactone 25 MG tablet Commonly known as:  ALDACTONE Take 1 tablet (25 mg total) by mouth daily.   thiamine 100 MG tablet Commonly known as:  VITAMIN B-1 Take 100 mg by  mouth daily. Reported on 02/17/2015   TOPROL XL 25 MG 24 hr tablet Generic drug:  metoprolol succinate TAKE 1 TABLET DAILY   traZODone 50 MG tablet Commonly known as:  DESYREL TAKE 1 TABLET AT BEDTIME   UNABLE TO FIND Take 120 mLs by mouth 2 (two) times daily. Med Name: Medpass 2.0 (document % consumed)   valACYclovir 1000 MG tablet Commonly known as:  VALTREX Take 1 g by mouth 2 (two) times daily.   VITAMIN B COMPLEX PO Take 1 tablet by mouth daily.       Review of Systems  Constitutional: Negative for activity change, appetite change, chills, fatigue and fever.  HENT: Negative for congestion, rhinorrhea, sinus pain, sinus pressure, sneezing and sore throat.   Eyes: Negative.   Respiratory: Negative for cough, chest tightness, shortness of breath and wheezing.   Cardiovascular: Positive for leg swelling. Negative for chest pain and palpitations.  Gastrointestinal: Negative for abdominal distention, abdominal pain, constipation, diarrhea, nausea and vomiting.  Endocrine: Negative.   Genitourinary: Negative for dysuria, flank pain, frequency and urgency.  Musculoskeletal: Positive for gait problem.       Left rib cage pain and left foot pain   Skin: Negative for color change, pallor and rash.  Neurological: Negative for  dizziness, seizures, syncope, light-headedness and headaches.  Hematological: Does not bruise/bleed easily.  Psychiatric/Behavioral: Negative for agitation, confusion, hallucinations and sleep disturbance. The patient is not nervous/anxious.     Immunization History  Administered Date(s) Administered  . Influenza Whole 10/20/2000, 10/17/2008, 10/09/2009  . Influenza,inj,Quad PF,36+ Mos 10/04/2013, 10/24/2014, 09/09/2015  . PPD Test 05/22/2016  . Pneumococcal Conjugate-13 10/04/2013  . Pneumococcal Polysaccharide-23 11/18/1999, 11/05/2015  . Td 02/22/2003  . Tdap 07/17/2012  . Zoster 05/12/2012   Pertinent  Health Maintenance Due  Topic Date Due  . INFLUENZA VACCINE  08/11/2016  . COLONOSCOPY  02/17/2020  . PNA vac Low Risk Adult  Completed   Fall Risk  11/05/2015 10/24/2014  Falls in the past year? Yes Yes  Number falls in past yr: 1 1  Injury with Fall? Yes No  Follow up Falls evaluation completed;Falls prevention discussed -    Vitals:   05/26/16 1213  BP: 118/71  Pulse: 82  Resp: 18  Temp: 98.1 F (36.7 C)  SpO2: 96%  Weight: 177 lb 1.6 oz (80.3 kg)  Height: 5\' 9"  (1.753 m)   Body mass index is 26.15 kg/m. Physical Exam  Constitutional: He is oriented to person, place, and time. He appears well-developed and well-nourished. No distress.  HENT:  Head: Normocephalic and atraumatic.  Mouth/Throat: Oropharynx is clear and moist. No oropharyngeal exudate.  Eyes: Conjunctivae and EOM are normal. Pupils are equal, round, and reactive to light. Right eye exhibits no discharge. Left eye exhibits no discharge. No scleral icterus.  Neck: Normal range of motion. No JVD present. No thyromegaly present.  Cardiovascular: Normal rate, regular rhythm, normal heart sounds and intact distal pulses.  Exam reveals no gallop and no friction rub.   No murmur heard. Pulmonary/Chest: Effort normal and breath sounds normal. No respiratory distress. He has no wheezes. He has no rales.   Abdominal: Soft. Bowel sounds are normal. He exhibits no distension. There is no tenderness. There is no rebound and no guarding.  Musculoskeletal:  Moves x 4 extremities.unsteady gait. Bilateral lower extremities +1 edema .   Lymphadenopathy:    He has no cervical adenopathy.  Neurological: He is oriented to person, place, and time.  Skin: Skin is warm  and dry. No rash noted. No erythema. No pallor.  Psychiatric: He has a normal mood and affect.    Labs reviewed:  Recent Labs  05/18/16 0950 05/19/16 0506 05/20/16 0857 05/21/16 0537 05/22/16 0428 05/25/16  NA  --  125* 126* 128* 129* 139  K  --  3.9 3.9 3.4* 3.6 3.6  CL  --  92* 93* 96* 94*  --   CO2  --  24 24 27 28   --   GLUCOSE  --  124* 105* 82 98  --   BUN  --  30* 25* 18 16 15   CREATININE  --  0.92 0.83 0.81 0.82 0.8  CALCIUM  --  7.8* 8.2* 7.9* 7.9*  --   MG 1.7 2.5*  --  1.8  --   --   PHOS  --   --  2.6 2.3* 2.8  --     Recent Labs  05/17/16 0934 05/18/16 0451 05/19/16 0506 05/20/16 0857 05/21/16 0537 05/22/16 0428 05/25/16  AST 32 34 24  --   --   --  15  ALT 31 24 26   --   --   --  15  ALKPHOS 56 40 54  --   --   --  74  BILITOT 1.1 1.5* 1.5*  --   --   --   --   PROT 5.4* 3.9* 4.5*  --   --   --   --   ALBUMIN 3.2* 2.4* 2.6* 2.7* 2.3* 2.3*  --     Recent Labs  12/09/15 0937  05/17/16 0934  05/19/16 0506 05/20/16 0446 05/21/16 0537 05/22/16 0428 05/25/16  WBC 6.9  < > 17.3*  < > 11.9* 9.4 6.6 7.0 6.3  NEUTROABS 4.8  --  13.5*  --  8.8*  --   --   --   --   HGB 12.0*  < > 8.2*  < > 7.7* 6.5* 7.6* 7.8* 8.8*  HCT 35.4*  < > 22.5*  < > 21.3* 18.9* 22.4* 23.1* 27*  MCV 92.7  < > 88.2  < > 87.3 87.9 87.8 89.5  --   PLT 151.0  < > 217  < > 125* 128* 138* 164 246  < > = values in this interval not displayed. Lab Results  Component Value Date   TSH 1.89 05/16/2015   Lab Results  Component Value Date   HGBA1C 5.6 03/03/2012   Lab Results  Component Value Date   CHOL 190 11/05/2015   HDL  118.80 11/05/2015   LDLCALC 54 11/05/2015   TRIG 86.0 11/05/2015   CHOLHDL 2 11/05/2015   Assessment/Plan 1. Iron deficiency anemia, unspecified iron deficiency anemia type Hgb 8.8, HCT 26.9 (05/25/2016) Has improved previous Hgb 7.8 ( 05/22/2016). Post blood transfusion during recent Hospitalization. Will add ferrous sulfate 325 mg Tablet one by mouth daily. Add colace 100 mg Capsule one by mouth daily as needed for constipation.    2. Moderate protein-calorie malnutrition (HCC)  TP 4.8 (05/25/2016). Med pass already ordered by Registered dietician. Will continue on med pass 2.0 twice daily.Reports improved appetite. Recheck CMP 06/02/2016.    3. Hypoalbuminemia  ALB 2.98 (05/25/2016). Get RD consult for supplements. Repeat CMP 06/02/2016.   Family/ staff Communication: Reviewed plan of care with patient and facility Nurse supervisor  Labs/tests ordered: CBC, CMP 06/02/2016  Sandrea Hughs, NP

## 2016-05-28 DIAGNOSIS — L120 Bullous pemphigoid: Secondary | ICD-10-CM | POA: Diagnosis not present

## 2016-06-01 DIAGNOSIS — Z9181 History of falling: Secondary | ICD-10-CM | POA: Diagnosis not present

## 2016-06-01 DIAGNOSIS — R079 Chest pain, unspecified: Secondary | ICD-10-CM | POA: Diagnosis not present

## 2016-06-02 LAB — HEPATIC FUNCTION PANEL
ALK PHOS: 165 U/L — AB (ref 25–125)
ALT: 17 U/L (ref 10–40)
AST: 16 U/L (ref 14–40)
Bilirubin, Total: 0.5 mg/dL

## 2016-06-02 LAB — CBC AND DIFFERENTIAL
HCT: 30 % — AB (ref 41–53)
HEMOGLOBIN: 9.9 g/dL — AB (ref 13.5–17.5)
PLATELETS: 294 10*3/uL (ref 150–399)
WBC: 8.6 10^3/mL

## 2016-06-02 LAB — BASIC METABOLIC PANEL
BUN: 16 mg/dL (ref 4–21)
CREATININE: 1 mg/dL (ref 0.6–1.3)
Glucose: 94 mg/dL
POTASSIUM: 3.3 mmol/L — AB (ref 3.4–5.3)
Sodium: 139 mmol/L (ref 137–147)

## 2016-06-04 ENCOUNTER — Encounter: Payer: Self-pay | Admitting: *Deleted

## 2016-06-04 DIAGNOSIS — R079 Chest pain, unspecified: Secondary | ICD-10-CM | POA: Diagnosis not present

## 2016-06-04 DIAGNOSIS — Z9181 History of falling: Secondary | ICD-10-CM | POA: Diagnosis not present

## 2016-06-04 LAB — BASIC METABOLIC PANEL
BUN: 16 mg/dL (ref 4–21)
CREATININE: 0.9 mg/dL (ref 0.6–1.3)
Glucose: 95 mg/dL
Potassium: 3.8 mmol/L (ref 3.4–5.3)
SODIUM: 138 mmol/L (ref 137–147)

## 2016-06-08 ENCOUNTER — Encounter: Payer: Self-pay | Admitting: *Deleted

## 2016-06-09 DIAGNOSIS — R079 Chest pain, unspecified: Secondary | ICD-10-CM | POA: Diagnosis not present

## 2016-06-09 DIAGNOSIS — Z9181 History of falling: Secondary | ICD-10-CM | POA: Diagnosis not present

## 2016-06-15 DIAGNOSIS — R079 Chest pain, unspecified: Secondary | ICD-10-CM | POA: Diagnosis not present

## 2016-06-15 DIAGNOSIS — Z9181 History of falling: Secondary | ICD-10-CM | POA: Diagnosis not present

## 2016-06-21 DIAGNOSIS — I10 Essential (primary) hypertension: Secondary | ICD-10-CM | POA: Diagnosis not present

## 2016-06-21 DIAGNOSIS — E785 Hyperlipidemia, unspecified: Secondary | ICD-10-CM | POA: Diagnosis not present

## 2016-06-21 DIAGNOSIS — I420 Dilated cardiomyopathy: Secondary | ICD-10-CM | POA: Diagnosis not present

## 2016-06-21 DIAGNOSIS — L309 Dermatitis, unspecified: Secondary | ICD-10-CM | POA: Diagnosis not present

## 2016-06-29 DIAGNOSIS — R279 Unspecified lack of coordination: Secondary | ICD-10-CM | POA: Diagnosis not present

## 2016-06-29 DIAGNOSIS — M6281 Muscle weakness (generalized): Secondary | ICD-10-CM | POA: Diagnosis not present

## 2016-06-30 DIAGNOSIS — R279 Unspecified lack of coordination: Secondary | ICD-10-CM | POA: Diagnosis not present

## 2016-06-30 DIAGNOSIS — M6281 Muscle weakness (generalized): Secondary | ICD-10-CM | POA: Diagnosis not present

## 2016-07-02 DIAGNOSIS — M6281 Muscle weakness (generalized): Secondary | ICD-10-CM | POA: Diagnosis not present

## 2016-07-02 DIAGNOSIS — R279 Unspecified lack of coordination: Secondary | ICD-10-CM | POA: Diagnosis not present

## 2016-07-05 ENCOUNTER — Telehealth: Payer: Self-pay

## 2016-07-05 ENCOUNTER — Telehealth: Payer: Self-pay | Admitting: Family Medicine

## 2016-07-05 NOTE — Telephone Encounter (Signed)
Pt dropped of records from [facility]. Placed on cart.

## 2016-07-05 NOTE — Telephone Encounter (Signed)
I spoke with pt and pt went back to [facility] does not know who he spoke with at [facility]) on 07/05/16 and pt told them that puncture wound happened while at [facility].Pt is presently going to [facility] 3 x a week. Pt was told to put abx ointment on leg and you are to see someone at [facility] as outpt on 07/06/16. Pt thinks area looks swollen. Pt also has appt with Dr Deborra Medina on 07/08/16.pt will cb if needs to be seen prior to 07/08/16. FYI to Dr Deborra Medina.

## 2016-07-05 NOTE — Telephone Encounter (Signed)
PLEASE NOTE: All timestamps contained within this report are represented as [place] Standard Time. CONFIDENTIALTY NOTICE: This fax transmission is intended only for the addressee. It contains information that is legally privileged, confidential or otherwise protected from use or disclosure. If you are not the intended recipient, you are strictly prohibited from reviewing, disclosing, copying using or disseminating any of this information or taking any action in reliance on or regarding this information. If you have received this fax in error, please notify us immediately by telephone so that we can arrange for its return to [place]. Phone: [phone], Toll-Free: [phone], Fax: [phone] Page: 1 of 2 Call Id: [id number] [place] Patient Name: [name] Gender: Male DOB: [birth year] Age: 71 Y 10 M 20 D Return Phone Number: [phone] (Primary) City/State/Zip: [place] Client [facility] - Client Client Site [place] [facility] - Day Physician [name] - MD Who Is Calling Patient / Member / Family / Caregiver Call Type Triage / Clinical Caller Name Gilbert Manolis Relationship To Patient Self Return Phone Number [phone] (Primary) Chief Complaint Leg Swelling And Edema Reason for Call Symptomatic / Request for [place] states his left leg is twice of his right leg. Painful. He punctured his leg with the wheelchair, two weeks ago. He was in rehab for broken ribs. Appointment Disposition EMR Patient Refused Appointment Info pasted into Epic Yes Nurse Assessment Nurse: Jimmey Ralph, RN, Lissa Date/Time (Eastern Time): 07/02/2016 3:01:23 PM Confirm and document reason for call. If symptomatic, describe symptoms. ---Caller states his left leg is twice of his right leg. Painful. He punctured his leg with the wheelchair, two weeks  ago. He was in rehab for broken ribs. I think it is infected and I am using mupricion. The puncture wound is to the left side of the calf. No fever. Does the PT have any chronic conditions? (i.e. diabetes, asthma, etc.) ---No Guidelines Guideline Title Affirmed Question Wound Infection Other signs of wound infection Disp. Time Eilene Ghazi Time) Disposition Final User 07/02/2016 3:08:11 PM See Physician within 24 Hours Yes Hammonds, RN, Lissa Referrals REFERRED TO PCP OFFICE Care Advice Given Per Guideline SEE PHYSICIAN WITHIN 24 HOURS: WARM SOAKS OR LOCAL HEAT: ANTIBIOTIC OINTMENT: * Apply an overthe- counter antibiotic ointment (e.g., Bacitracin) 3 times a day. PAIN MEDICINES: ACETAMINOPHEN (E.G., TYLENOL): IBUPROFEN (E.G., MOTRIN, ADVIL): CALL BACK IF: * Fever occurs * You become worse. CARE ADVICE per Wound Infection (Adult) guideline. PLEASE NOTE: All timestamps contained within this report are represented as [place] Standard Time. CONFIDENTIALTY NOTICE: This fax transmission is intended only for the addressee. It contains information that is legally privileged, confidential or otherwise protected from use or disclosure. If you are not the intended recipient, you are strictly prohibited from reviewing, disclosing, copying using or disseminating any of this information or taking any action in reliance on or regarding this information. If you have received this fax in error, please notify us immediately by telephone so that we can arrange for its return to [place]. Phone: [phone], Toll-Free: [phone], Fax: [phone] Page: 2 of 2 Call Id: [id number] Comments User: Moses Manners, RN Date/Time Eilene Ghazi Time): 07/02/2016 3:09:52 PM Caller states he doesn't have transportation and will not be able to be seen until monday. He is requesting an early monday appointment with dr. Durward Fortes caller to call back for any new or worsening symptoms. User: Moses Manners, RN Date/Time Eilene Ghazi Time):  07/02/2016 3:25:22 PM Caller refused see physician in 24 hours outcome and is requesting a call back to schedule appointment for monday related to no transportation.

## 2016-07-06 DIAGNOSIS — M6281 Muscle weakness (generalized): Secondary | ICD-10-CM | POA: Diagnosis not present

## 2016-07-06 DIAGNOSIS — R279 Unspecified lack of coordination: Secondary | ICD-10-CM | POA: Diagnosis not present

## 2016-07-07 DIAGNOSIS — M6281 Muscle weakness (generalized): Secondary | ICD-10-CM | POA: Diagnosis not present

## 2016-07-07 DIAGNOSIS — R279 Unspecified lack of coordination: Secondary | ICD-10-CM | POA: Diagnosis not present

## 2016-07-08 ENCOUNTER — Ambulatory Visit (INDEPENDENT_AMBULATORY_CARE_PROVIDER_SITE_OTHER): Payer: Medicare Other | Admitting: Family Medicine

## 2016-07-08 ENCOUNTER — Encounter: Payer: Self-pay | Admitting: Family Medicine

## 2016-07-08 VITALS — BP 120/80 | HR 80 | Ht 66.25 in | Wt 183.8 lb

## 2016-07-08 DIAGNOSIS — R55 Syncope and collapse: Secondary | ICD-10-CM

## 2016-07-08 DIAGNOSIS — L120 Bullous pemphigoid: Secondary | ICD-10-CM

## 2016-07-08 DIAGNOSIS — D539 Nutritional anemia, unspecified: Secondary | ICD-10-CM | POA: Diagnosis not present

## 2016-07-08 DIAGNOSIS — Z4659 Encounter for fitting and adjustment of other gastrointestinal appliance and device: Secondary | ICD-10-CM | POA: Insufficient documentation

## 2016-07-08 DIAGNOSIS — L0390 Cellulitis, unspecified: Secondary | ICD-10-CM

## 2016-07-08 DIAGNOSIS — T148XXA Other injury of unspecified body region, initial encounter: Secondary | ICD-10-CM | POA: Diagnosis not present

## 2016-07-08 DIAGNOSIS — D509 Iron deficiency anemia, unspecified: Secondary | ICD-10-CM | POA: Diagnosis not present

## 2016-07-08 DIAGNOSIS — S2242XD Multiple fractures of ribs, left side, subsequent encounter for fracture with routine healing: Secondary | ICD-10-CM | POA: Diagnosis not present

## 2016-07-08 DIAGNOSIS — L089 Local infection of the skin and subcutaneous tissue, unspecified: Secondary | ICD-10-CM

## 2016-07-08 DIAGNOSIS — M6281 Muscle weakness (generalized): Secondary | ICD-10-CM | POA: Diagnosis not present

## 2016-07-08 LAB — CBC WITH DIFFERENTIAL/PLATELET
BASOS ABS: 0 10*3/uL (ref 0.0–0.1)
Basophils Relative: 0.5 % (ref 0.0–3.0)
EOS PCT: 1.2 % (ref 0.0–5.0)
Eosinophils Absolute: 0.1 10*3/uL (ref 0.0–0.7)
HEMATOCRIT: 33.7 % — AB (ref 39.0–52.0)
Hemoglobin: 11.3 g/dL — ABNORMAL LOW (ref 13.0–17.0)
LYMPHS PCT: 14.9 % (ref 12.0–46.0)
Lymphs Abs: 1.1 10*3/uL (ref 0.7–4.0)
MCHC: 33.6 g/dL (ref 30.0–36.0)
MCV: 91.9 fl (ref 78.0–100.0)
MONOS PCT: 8.5 % (ref 3.0–12.0)
Monocytes Absolute: 0.6 10*3/uL (ref 0.1–1.0)
Neutro Abs: 5.6 10*3/uL (ref 1.4–7.7)
Neutrophils Relative %: 74.9 % (ref 43.0–77.0)
Platelets: 235 10*3/uL (ref 150.0–400.0)
RBC: 3.67 Mil/uL — AB (ref 4.22–5.81)
RDW: 18.5 % — ABNORMAL HIGH (ref 11.5–15.5)
WBC: 7.5 10*3/uL (ref 4.0–10.5)

## 2016-07-08 LAB — COMPREHENSIVE METABOLIC PANEL
ALBUMIN: 3.9 g/dL (ref 3.5–5.2)
ALK PHOS: 70 U/L (ref 39–117)
ALT: 12 U/L (ref 0–53)
AST: 18 U/L (ref 0–37)
BILIRUBIN TOTAL: 0.5 mg/dL (ref 0.2–1.2)
BUN: 10 mg/dL (ref 6–23)
CALCIUM: 9.2 mg/dL (ref 8.4–10.5)
CO2: 27 mEq/L (ref 19–32)
Chloride: 105 mEq/L (ref 96–112)
Creatinine, Ser: 0.97 mg/dL (ref 0.40–1.50)
GFR: 81.35 mL/min (ref >60.00)
Glucose, Bld: 107 mg/dL — ABNORMAL HIGH (ref 70–99)
Potassium: 4.1 mEq/L (ref 3.5–5.1)
Sodium: 142 mEq/L (ref 135–145)
TOTAL PROTEIN: 6 g/dL (ref 6.0–8.3)

## 2016-07-08 MED ORDER — TRAZODONE HCL 50 MG PO TABS: 50.0000 mg | ORAL_TABLET | Freq: Every day | ORAL | 1 refills | Status: DC

## 2016-07-08 MED ORDER — QUETIAPINE FUMARATE 300 MG PO TABS: 300.0000 mg | ORAL_TABLET | Freq: Every day | ORAL | 1 refills | Status: DC

## 2016-07-08 MED ORDER — DOXYCYCLINE HYCLATE 100 MG PO CAPS: 100.0000 mg | ORAL_CAPSULE | Freq: Two times a day (BID) | ORAL | 0 refills | Status: DC

## 2016-07-08 NOTE — Assessment & Plan Note (Signed)
Repeat CBC today. Continue iron.

## 2016-07-08 NOTE — Progress Notes (Signed)
Subjective:   Patient ID: [name], male    DOB: 1945/11/08, 71 y.o.   MRN: [id number]  [name] is a pleasant 71 y.o. year old male who presents to clinic today with Follow-up  on 07/08/2016  HPI:  Discharged from [facility] on 05/26/16 after he was discharged from the hospital on 06/04/16 s/p fall.  Notes reviewed. Admission HPI as follows:  Patient is a 71 y.o. male seen today for short term rehabilitation post hospital admission from 05/17/16-05/22/16 post fall with near syncope episode. He sustained multiple left rib fracture. He had hyponatremia and drop in hemoglobin. He was seen by GI and hematology for his anemia. Hemolysis was ruled out. He received blood transfusion. Anemia workup was unrevealing. Ct head and abdomen were negative for bleed and acute abnormalities. He has PMH of alcohol use, memory loss, frequent falls, PVD, CAD, parkinson's syndrome among others.  Received PT/OT at SNF at continues to go to [facility] three times weekly for PT/OT.  He does feel his generalized weakness is improving.  Anemia- was seen by GI and hematology in the hospital.  GIB reuled out. Recent colonoscopy and EGD (2017). Did received RRBCs in the hospital and was discharged from [facility] place on Ferrous sulfate 325 mg daily.  Rib fractures- still having pain. Took two alleve this morning.  He is also being treated by dermatology, Dr. [name] Crane, for bullous pemphigoid.  Lesion on left leg has busted open and is red and painful. He stopped taking doxycycline which was prescribed by Dr. [name] Crane.  He is still taking prednisone 10 mg daily as prescribed. Also has some chronic PVD and protein calorie malnutrition which can lead to poor wound healing.  Current Outpatient Prescriptions on File Prior to Visit  Medication Sig Dispense Refill  . B Complex Vitamins (VITAMIN B COMPLEX PO) Take 1 tablet by mouth daily.     . cetirizine (ZYRTEC) 10 MG tablet Take 10 mg by mouth daily.     [name] docusate  sodium (COLACE) 100 MG capsule Take 1 capsule (100 mg total) by mouth daily as needed for mild constipation. 10 capsule 0  . furosemide (LASIX) 40 MG tablet Take 1 tablet (40 mg total) by mouth daily. 30 tablet 0  . lidocaine (LIDODERM) 5 % Place 1 patch onto the skin daily. Remove & Discard patch within 12 hours or as directed by MD 90 patch 0  . magnesium oxide (MAG-OX) 400 MG tablet Take 400 mg by mouth daily as needed.    . mupirocin ointment (BACTROBAN) 2 % Apply 1 application topically daily. Left foot    . oxyCODONE (OXY IR/ROXICODONE) 5 MG immediate release tablet Take 1 tablet (5 mg total) by mouth every 6 (six) hours as needed for severe pain. 10 tablet 0  . polyethylene glycol (MIRALAX / GLYCOLAX) packet Take 17 g by mouth daily. 14 each 0  . potassium chloride (KLOR-CON M15) 15 MEQ tablet Take 2 tablets (30 mEq total) by mouth daily.    . predniSONE (DELTASONE) 10 MG tablet Take 5 mg by mouth daily with breakfast.     . promethazine (PHENERGAN) 25 MG tablet TAKE 1 TABLET DAILY AS NEEDED FOR NAUSEA 90 tablet 0  . spironolactone (ALDACTONE) 25 MG tablet Take 1 tablet (25 mg total) by mouth daily. 90 tablet 3  . Thiamine HCl (VITAMIN B-1) 100 MG tablet Take 100 mg by mouth daily. Reported on 02/17/2015    . TOPROL XL 25 MG 24 hr tablet  TAKE 1 TABLET DAILY 90 tablet 1  . UNABLE TO FIND Take 120 mLs by mouth 2 (two) times daily. Med Name: Medpass 2.0 (document % consumed)    . valACYclovir (VALTREX) 1000 MG tablet Take 1 g by mouth 2 (two) times daily.  0   Current Facility-Administered Medications on File Prior to Visit  Medication Dose Route Frequency Provider Last Rate Last Dose  . ferrous sulfate tablet 325 mg  325 mg Oral Q breakfast Ngetich, Dinah C, NP        Allergies  Allergen Reactions  . Nifedipine Swelling    REACTION: SWELLING, 11/02/2011 pt denies this allergy.    Past Medical History:  Diagnosis Date  . Adenomatous polyp of colon 2006  . Anemia 2012  . Anxiety   .  Arthritis    "left arm/shoulder" (05/17/2016)  . Atrial tachycardia, paroxysmal ([place]) 04/07/2015  . BPH (benign prostatic hypertrophy) 5/99  . Chronic bronchitis ([place])    "q year or q other year" (11/02/2011)  . Chronic nausea    "a few days/week" (05/17/2016)  . Colon polyp   . DCM (dilated cardiomyopathy) ([place])    EF 45-50% by echo and cath 2017  . Depression    Hx of  . Falls frequently    "daily lately; legs just give out" (11/02/2011; 05/17/2016)  . GERD (gastroesophageal reflux disease) 07/31/04   gastritis   . H/O hiatal hernia   . Hard of hearing   . Heart murmur   . Hernia, umbilical    "unrepaired" (11/02/2011; 05/17/2016)  . History of blood transfusion 2012  . HLD (hyperlipidemia) 5/99  . HTN (hypertension) 5/99  . Memory loss    "recently has been forgetting alot; maybe related to the alcohol" (11/02/2011; 05/17/2016)  . Mild CAD    10% RCA  . PAC (premature atrial contraction) 04/07/2015  . Peripheral vascular disease ([place])   . Pneumonia 1996   hosp  . Pollen allergy    pollen/ragweed  . Seasonal allergies     Past Surgical History:  Procedure Laterality Date  . BACK SURGERY    . CARDIAC CATHETERIZATION N/A 05/23/2015   Procedure: Left Heart Cath and Coronary Angiography;  Surgeon: Troy Sine, MD;  Location: [place] [facility];  Service: Cardiovascular;  Laterality: N/A;  . CATARACT EXTRACTION W/ INTRAOCULAR LENS  IMPLANT, BILATERAL Bilateral 10/2002  . COLONOSCOPY  07/31/04   multiple polyps; repeat in 3 years  . DUPUYTREN CONTRACTURE RELEASE Left   . ETT myoview  03/09/07   nml EF 66%  . FRACTURE SURGERY    . HEMORRHOID SURGERY     "cauterized a long time ago" (11/02/2011)  . hosp CP R/O'D 2/24  03/08/07  . neck MRI  6/04   C5-6 disc abnormality  . ORIF HUMERUS FRACTURE  11/04/2011   Procedure: OPEN REDUCTION INTERNAL FIXATION (ORIF) PROXIMAL HUMERUS FRACTURE;  Surgeon: Rozanna Box, MD;  Location: [place];  Service: Orthopedics;  Laterality: Left;  .  POSTERIOR CERVICAL FUSION/FORAMINOTOMY N/A 03/21/2012   Procedure: POSTERIOR CERVICAL FUSION/FORAMINOTOMY LEVEL ONE;  Surgeon: Charlie Pitter, MD;  Location: [place];  Service: Neurosurgery;  Laterality: N/A;  POSTERIOR CERVICAL ONE TO CERVICAL TWO FUSION WITH ILIAC CREST GRAFT AND LATERAL MASS SCREWS   . TONSILLECTOMY     "I was young" (11/02/2011)  . UPPER GASTROINTESTINAL ENDOSCOPY      Family History  Problem Relation Age of Onset  . Heart disease Father   . Diabetes Father   .  Stroke Father   . Depression Mother   . Heart disease Brother   . Alcohol abuse Brother   . Liver disease Brother     Social History   Social History  . Marital status: Married    Spouse name: N/A  . Number of children: 3  . 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 Smoking status: Former Smoker    Types: Pipe  . Smokeless tobacco: Never Used     Comment: 05/17/2016 "stopped in the late 1990s"  . Alcohol use 67.2 oz/week    112 Glasses of wine per week     Comment: 11/02/2011 "large box of wine q 2 days" 01-27-15 2 glass a day of wine; 05/17/2016 "1.75L bottles; 1- 1 1/2 bottles/day; somedays worse than others; at least 3-4 days/week he'll have 1 1/2 bottles; last drink was 05/16/2016" (05/17/2016)  . Drug use: No  . Sexual activity: No   Other Topics Concern  . Not on file   Social History Narrative   Married (remarried), lives with wife; 3 children, 1 at home.    [employer] - mixes colors       Pt revised designated party release form Martha Soltys, wife [phone] - can leave msg.     Allen Norris 03/04/10.        Would desire CPR.  Would not want prolonged life support if futile.   The PMH, PSH, Social History, Family History, Medications, and allergies have been reviewed in [facility], and have been updated if relevant.  Review of Systems  Constitutional: Negative.   HENT: Negative.   Respiratory: Negative.     Cardiovascular: Negative.   Gastrointestinal: Negative.   Musculoskeletal:       + left sided rib pain  Skin: Positive for color change and wound. Negative for rash.  Neurological: Negative.   Hematological: Negative.   Psychiatric/Behavioral: Negative.   All other systems reviewed and are negative.      Objective:    BP 120/80   Pulse 80   Ht 5' 6.25" (1.683 m)   Wt 183 lb 12 oz (83.3 kg)   SpO2 100%   BMI 29.43 kg/m   Wt Readings from Last 3 Encounters:  07/08/16 183 lb 12 oz (83.3 kg)  05/26/16 177 lb 1.6 oz (80.3 kg)  05/24/16 185 lb 8 oz (84.1 kg)    Physical Exam  Constitutional: He is oriented to person, place, and time. He appears well-developed and well-nourished. No distress.  HENT:  Head: Normocephalic and atraumatic.  Eyes: Conjunctivae are normal.  Cardiovascular: Normal rate.   Pulmonary/Chest: Effort normal.  + ttp over left rib cage  Abdominal: Soft.  Musculoskeletal: He exhibits no edema.  Neurological: He is alert and oriented to person, place, and time. No cranial nerve deficit.  Skin: Skin is warm. No rash noted. He is not diaphoretic.     Psychiatric: He has a normal mood and affect. His behavior is normal. Judgment and thought content normal.  Nursing note and vitals reviewed.         Assessment & Plan:   Syncope and collapse  Closed fracture of multiple ribs of left side with routine healing, subsequent encounter - Plan: Comprehensive metabolic panel  Iron deficiency anemia, unspecified iron deficiency anemia type - Plan: CBC with Differential/Platelet  Infected laceration No Follow-up on file.

## 2016-07-08 NOTE — Progress Notes (Deleted)
   Subjective:   Patient ID: [name], male    DOB: [birth year], 71 y.o.   MRN: [id number]  [name] is a pleasant 71 y.o. year old male who presents to clinic today with Medicare Wellness  on 07/08/2016  HPI: ***  Review of Systems     Objective:    BP 120/80   Pulse 80   Ht 5' 6.25" (1.683 m)   Wt 183 lb 12 oz (83.3 kg)   SpO2 100%   BMI 29.43 kg/m    Physical Exam        Assessment & Plan:   Medicare annual wellness visit, subsequent No Follow-up on file.

## 2016-07-08 NOTE — Assessment & Plan Note (Signed)
Now with an infected lesion. Advised to restart doxycyline.  Follow up with Dr. [name] Crane. Call or return to clinic prn if these symptoms worsen or fail to improve as anticipated. The patient indicates understanding of these issues and agrees with the plan.

## 2016-07-08 NOTE — Patient Instructions (Signed)
Great to see you.  Restart taking Doxycyline 100 mg twice daily x 10 days.  I will call you with your lab results.

## 2016-07-08 NOTE — Assessment & Plan Note (Signed)
Pain does seem to be improving.

## 2016-07-08 NOTE — Assessment & Plan Note (Signed)
High fall risk. Continue outpatient PT/OT.

## 2016-07-09 DIAGNOSIS — R279 Unspecified lack of coordination: Secondary | ICD-10-CM | POA: Diagnosis not present

## 2016-07-09 DIAGNOSIS — M6281 Muscle weakness (generalized): Secondary | ICD-10-CM | POA: Diagnosis not present

## 2016-09-15 ENCOUNTER — Encounter: Payer: Self-pay | Admitting: Family Medicine

## 2016-09-15 ENCOUNTER — Ambulatory Visit (INDEPENDENT_AMBULATORY_CARE_PROVIDER_SITE_OTHER): Payer: Medicare Other | Admitting: Family Medicine

## 2016-09-15 DIAGNOSIS — M62442 Contracture of muscle, left hand: Secondary | ICD-10-CM

## 2016-09-15 MED ORDER — POTASSIUM CHLORIDE CRYS ER 15 MEQ PO TBCR: 30.0000 meq | EXTENDED_RELEASE_TABLET | Freq: Every day | ORAL | 3 refills | Status: DC

## 2016-09-15 MED ORDER — OXYCODONE HCL 5 MG PO TABS: 5.0000 mg | ORAL_TABLET | Freq: Four times a day (QID) | ORAL | 0 refills | Status: DC | PRN

## 2016-09-15 MED ORDER — CETIRIZINE HCL 10 MG PO TABS: 10.0000 mg | ORAL_TABLET | Freq: Every day | ORAL | 3 refills | Status: DC

## 2016-09-15 MED ORDER — CYCLOBENZAPRINE HCL 10 MG PO TABS: 10.0000 mg | ORAL_TABLET | Freq: Three times a day (TID) | ORAL | 3 refills | Status: DC | PRN

## 2016-09-15 MED ORDER — FUROSEMIDE 40 MG PO TABS: 40.0000 mg | ORAL_TABLET | Freq: Every day | ORAL | 3 refills | Status: DC

## 2016-09-15 NOTE — Assessment & Plan Note (Signed)
Refer to hand surgeon given complexity of this case. The patient indicates understanding of these issues and agrees with the plan.

## 2016-09-15 NOTE — Patient Instructions (Signed)
Great to see you. We are referring you to a hand surgeon.

## 2016-09-15 NOTE — Progress Notes (Signed)
Subjective:   Patient ID: [name], male    DOB: 1945-10-06, 71 y.o.   MRN: [id number]  [name] is a pleasant 71 y.o. year old male who presents to clinic today with Arm Pain (left arm)  on 09/15/2016  HPI:  Difficult and complex situation.  H/o significant alcohol abuse with multiple falls and subsequent fractures.  Ongoing left shoulder pain, status post left proximal humerus fracture with ORIF on 35/32/9924 and other complications including a neck fracture managed by neurosurgery.  Here today because the contracture (previous surgery decades ago) on his left hand is causing more pain and decreased function.  Worsening over past 2 months.    Current Outpatient Prescriptions on File Prior to Visit  Medication Sig Dispense Refill  . B Complex Vitamins (VITAMIN B COMPLEX PO) Take 1 tablet by mouth daily.     [name] docusate sodium (COLACE) 100 MG capsule Take 1 capsule (100 mg total) by mouth daily as needed for mild constipation. 10 capsule 0  . doxycycline (VIBRAMYCIN) 100 MG capsule Take 1 capsule (100 mg total) by mouth 2 (two) times daily. 20 capsule 0  . lidocaine (LIDODERM) 5 % Place 1 patch onto the skin daily. Remove & Discard patch within 12 hours or as directed by MD 90 patch 0  . magnesium oxide (MAG-OX) 400 MG tablet Take 400 mg by mouth daily as needed.    . mupirocin ointment (BACTROBAN) 2 % Apply 1 application topically daily. Left foot    . polyethylene glycol (MIRALAX / GLYCOLAX) packet Take 17 g by mouth daily. 14 each 0  . predniSONE (DELTASONE) 10 MG tablet Take 5 mg by mouth daily with breakfast.     . promethazine (PHENERGAN) 25 MG tablet TAKE 1 TABLET DAILY AS NEEDED FOR NAUSEA 90 tablet 0  . QUEtiapine (SEROQUEL) 300 MG tablet Take 1 tablet (300 mg total) by mouth at bedtime. 90 tablet 1  . spironolactone (ALDACTONE) 25 MG tablet Take 1 tablet (25 mg total) by mouth daily. 90 tablet 3  . Thiamine HCl (VITAMIN B-1) 100 MG tablet Take 100 mg by mouth  daily. Reported on 02/17/2015    . TOPROL XL 25 MG 24 hr tablet TAKE 1 TABLET DAILY 90 tablet 1  . traZODone (DESYREL) 50 MG tablet Take 1 tablet (50 mg total) by mouth at bedtime. 90 tablet 1  . UNABLE TO FIND Take 120 mLs by mouth 2 (two) times daily. Med Name: Medpass 2.0 (document % consumed)    . valACYclovir (VALTREX) 1000 MG tablet Take 1 g by mouth 2 (two) times daily.  0   No current facility-administered medications on file prior to visit.     Allergies  Allergen Reactions  . Nifedipine Swelling    REACTION: SWELLING, 11/02/2011 pt denies this allergy.    Past Medical History:  Diagnosis Date  . Adenomatous polyp of colon 2006  . Anemia 2012  . Anxiety   . Arthritis    "left arm/shoulder" (05/17/2016)  . Atrial tachycardia, paroxysmal ([place]) 04/07/2015  . BPH (benign prostatic hypertrophy) 5/99  . Chronic bronchitis ([place])    "q year or q other year" (11/02/2011)  . Chronic nausea    "a few days/week" (05/17/2016)  . Colon polyp   . DCM (dilated cardiomyopathy) ([place])    EF 45-50% by echo and cath 2017  . Depression    Hx of  . Falls frequently    "daily lately; legs just give out" (11/02/2011; 05/17/2016)  .  GERD (gastroesophageal reflux disease) 07/31/04   gastritis   . H/O hiatal hernia   . Hard of hearing   . Heart murmur   . Hernia, umbilical    "unrepaired" (11/02/2011; 05/17/2016)  . History of blood transfusion 2012  . HLD (hyperlipidemia) 5/99  . HTN (hypertension) 5/99  . Memory loss    "recently has been forgetting alot; maybe related to the alcohol" (11/02/2011; 05/17/2016)  . Mild CAD    10% RCA  . PAC (premature atrial contraction) 04/07/2015  . Peripheral vascular disease ([place])   . Pneumonia 1996   hosp  . Pollen allergy    pollen/ragweed  . Seasonal allergies     Past Surgical History:  Procedure Laterality Date  . BACK SURGERY    . CARDIAC CATHETERIZATION N/A 05/23/2015   Procedure: Left Heart Cath and Coronary Angiography;  Surgeon: Troy Sine, MD;  Location: [place] [facility];  Service: Cardiovascular;  Laterality: N/A;  . CATARACT EXTRACTION W/ INTRAOCULAR LENS  IMPLANT, BILATERAL Bilateral 10/2002  . COLONOSCOPY  07/31/04   multiple polyps; repeat in 3 years  . DUPUYTREN CONTRACTURE RELEASE Left   . ETT myoview  03/09/07   nml EF 66%  . FRACTURE SURGERY    . HEMORRHOID SURGERY     "cauterized a long time ago" (11/02/2011)  . hosp CP R/O'D 2/24  03/08/07  . neck MRI  6/04   C5-6 disc abnormality  . ORIF HUMERUS FRACTURE  11/04/2011   Procedure: OPEN REDUCTION INTERNAL FIXATION (ORIF) PROXIMAL HUMERUS FRACTURE;  Surgeon: Rozanna Box, MD;  Location: [facility];  Service: Orthopedics;  Laterality: Left;  . POSTERIOR CERVICAL FUSION/FORAMINOTOMY N/A 03/21/2012   Procedure: POSTERIOR CERVICAL FUSION/FORAMINOTOMY LEVEL ONE;  Surgeon: Charlie Pitter, MD;  Location: [place];  Service: Neurosurgery;  Laterality: N/A;  POSTERIOR CERVICAL ONE TO CERVICAL TWO FUSION WITH ILIAC CREST GRAFT AND LATERAL MASS SCREWS   . TONSILLECTOMY     "I was young" (11/02/2011)  . UPPER GASTROINTESTINAL ENDOSCOPY      Family History  Problem Relation Age of Onset  . Heart disease Father   . Diabetes Father   . Stroke Father   . Depression Mother   . Heart disease Brother   . Alcohol abuse Brother   . Liver disease Brother     Social History   Social History  . Marital status: Married    Spouse name: N/A  . Number of children: 3  . 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 Smoking status: Former Smoker    Types: Pipe  . Smokeless tobacco: Never Used     Comment: 05/17/2016 "stopped in the late 1990s"  . Alcohol use 67.2 oz/week    112 Glasses of wine per week     Comment: 11/02/2011 "large box of wine q 2 days" 01-27-15 2 glass a day of wine; 05/17/2016 "1.75L bottles; 1- 1 1/2 bottles/day; somedays worse than others; at least 3-4 days/week he'll have 1 1/2  bottles; last drink was 05/16/2016" (05/17/2016)  . Drug use: No  . Sexual activity: No   Other Topics Concern  . Not on file   Social History Narrative   Married (remarried), lives with wife; 3 children, 1 at home.    [employer] - mixes colors       Pt revised designated party release form Audry Pecina, wife [phone] -  can leave msg.     Allen Norris 03/04/10.        Would desire CPR.  Would not want prolonged life support if futile.   The PMH, PSH, Social History, Family History, Medications, and allergies have been reviewed in [facility], and have been updated if relevant.   Review of Systems  Musculoskeletal: Positive for arthralgias.  Neurological: Positive for weakness.  All other systems reviewed and are negative.      Objective:    BP 130/72   Pulse 98   Temp 98.2 F (36.8 C) (Oral)   Resp 16   Wt 184 lb (83.5 kg)   SpO2 99%   BMI 29.47 kg/m    Physical Exam  Constitutional: He is oriented to person, place, and time. He appears well-developed and well-nourished. No distress.  HENT:  Head: Normocephalic and atraumatic.  Eyes: Conjunctivae are normal.  Cardiovascular: Normal rate.   Pulmonary/Chest: Effort normal.  Musculoskeletal:       Arms: Neurological: He is alert and oriented to person, place, and time. No cranial nerve deficit.  Skin: Skin is warm and dry. He is not diaphoretic.  Psychiatric: He has a normal mood and affect. His behavior is normal. Judgment and thought content normal.  Nursing note and vitals reviewed.         Assessment & Plan:   Contracture of muscle of left hand - Plan: Ambulatory referral to Hand Surgery No Follow-up on file.

## 2016-09-17 ENCOUNTER — Telehealth: Payer: Self-pay | Admitting: Family Medicine

## 2016-09-17 NOTE — Telephone Encounter (Signed)
Spoke to spouse she will call back to let me know am/pm anyday?  To go to [facility] [place]

## 2016-09-21 DIAGNOSIS — H26491 Other secondary cataract, right eye: Secondary | ICD-10-CM | POA: Diagnosis not present

## 2016-09-21 DIAGNOSIS — H40023 Open angle with borderline findings, high risk, bilateral: Secondary | ICD-10-CM | POA: Diagnosis not present

## 2016-09-21 DIAGNOSIS — H35373 Puckering of macula, bilateral: Secondary | ICD-10-CM | POA: Diagnosis not present

## 2016-09-21 DIAGNOSIS — H353132 Nonexudative age-related macular degeneration, bilateral, intermediate dry stage: Secondary | ICD-10-CM | POA: Diagnosis not present

## 2016-10-20 DIAGNOSIS — H26491 Other secondary cataract, right eye: Secondary | ICD-10-CM | POA: Diagnosis not present

## 2016-10-20 DIAGNOSIS — I10 Essential (primary) hypertension: Secondary | ICD-10-CM | POA: Diagnosis not present

## 2016-10-20 DIAGNOSIS — H16223 Keratoconjunctivitis sicca, not specified as Sjogren's, bilateral: Secondary | ICD-10-CM | POA: Diagnosis not present

## 2016-10-20 DIAGNOSIS — H35371 Puckering of macula, right eye: Secondary | ICD-10-CM | POA: Diagnosis not present

## 2016-10-20 DIAGNOSIS — Z961 Presence of intraocular lens: Secondary | ICD-10-CM | POA: Diagnosis not present

## 2016-10-23 ENCOUNTER — Emergency Department (HOSPITAL_COMMUNITY): Payer: Medicare Other

## 2016-10-23 ENCOUNTER — Encounter (HOSPITAL_COMMUNITY): Payer: Self-pay

## 2016-10-23 ENCOUNTER — Inpatient Hospital Stay (HOSPITAL_COMMUNITY)
Admission: EM | Admit: 2016-10-23 | Discharge: 2016-10-31 | DRG: 470 | Disposition: A | Discharge status: Skilled Nursing Facility | Payer: Medicare Other | Attending: Internal Medicine | Admitting: Internal Medicine

## 2016-10-23 DIAGNOSIS — Z66 Do not resuscitate: Secondary | ICD-10-CM | POA: Diagnosis present

## 2016-10-23 DIAGNOSIS — I5022 Chronic systolic (congestive) heart failure: Secondary | ICD-10-CM | POA: Diagnosis present

## 2016-10-23 DIAGNOSIS — D509 Iron deficiency anemia, unspecified: Secondary | ICD-10-CM

## 2016-10-23 DIAGNOSIS — S79912A Unspecified injury of left hip, initial encounter: Secondary | ICD-10-CM | POA: Diagnosis not present

## 2016-10-23 DIAGNOSIS — S199XXA Unspecified injury of neck, initial encounter: Secondary | ICD-10-CM | POA: Diagnosis not present

## 2016-10-23 DIAGNOSIS — G9349 Other encephalopathy: Secondary | ICD-10-CM | POA: Diagnosis present

## 2016-10-23 DIAGNOSIS — F1010 Alcohol abuse, uncomplicated: Secondary | ICD-10-CM | POA: Diagnosis not present

## 2016-10-23 DIAGNOSIS — R140 Abdominal distension (gaseous): Secondary | ICD-10-CM | POA: Diagnosis not present

## 2016-10-23 DIAGNOSIS — B370 Candidal stomatitis: Secondary | ICD-10-CM | POA: Diagnosis not present

## 2016-10-23 DIAGNOSIS — F10239 Alcohol dependence with withdrawal, unspecified: Secondary | ICD-10-CM | POA: Diagnosis present

## 2016-10-23 DIAGNOSIS — K7030 Alcoholic cirrhosis of liver without ascites: Secondary | ICD-10-CM | POA: Diagnosis present

## 2016-10-23 DIAGNOSIS — D6959 Other secondary thrombocytopenia: Secondary | ICD-10-CM | POA: Diagnosis present

## 2016-10-23 DIAGNOSIS — S0990XA Unspecified injury of head, initial encounter: Secondary | ICD-10-CM | POA: Diagnosis not present

## 2016-10-23 DIAGNOSIS — F1020 Alcohol dependence, uncomplicated: Secondary | ICD-10-CM

## 2016-10-23 DIAGNOSIS — W19XXXA Unspecified fall, initial encounter: Secondary | ICD-10-CM

## 2016-10-23 DIAGNOSIS — K5900 Constipation, unspecified: Secondary | ICD-10-CM | POA: Diagnosis present

## 2016-10-23 DIAGNOSIS — M25552 Pain in left hip: Secondary | ICD-10-CM | POA: Diagnosis not present

## 2016-10-23 DIAGNOSIS — I2510 Atherosclerotic heart disease of native coronary artery without angina pectoris: Secondary | ICD-10-CM | POA: Diagnosis present

## 2016-10-23 DIAGNOSIS — W050XXA Fall from non-moving wheelchair, initial encounter: Secondary | ICD-10-CM | POA: Diagnosis present

## 2016-10-23 DIAGNOSIS — E871 Hypo-osmolality and hyponatremia: Secondary | ICD-10-CM | POA: Diagnosis present

## 2016-10-23 DIAGNOSIS — Y92009 Unspecified place in unspecified non-institutional (private) residence as the place of occurrence of the external cause: Secondary | ICD-10-CM

## 2016-10-23 DIAGNOSIS — IMO0001 Reserved for inherently not codable concepts without codable children: Secondary | ICD-10-CM

## 2016-10-23 DIAGNOSIS — G894 Chronic pain syndrome: Secondary | ICD-10-CM | POA: Diagnosis present

## 2016-10-23 DIAGNOSIS — E669 Obesity, unspecified: Secondary | ICD-10-CM | POA: Diagnosis present

## 2016-10-23 DIAGNOSIS — S72009A Fracture of unspecified part of neck of unspecified femur, initial encounter for closed fracture: Secondary | ICD-10-CM | POA: Diagnosis present

## 2016-10-23 DIAGNOSIS — K219 Gastro-esophageal reflux disease without esophagitis: Secondary | ICD-10-CM | POA: Diagnosis present

## 2016-10-23 DIAGNOSIS — S72002A Fracture of unspecified part of neck of left femur, initial encounter for closed fracture: Secondary | ICD-10-CM | POA: Diagnosis present

## 2016-10-23 DIAGNOSIS — D696 Thrombocytopenia, unspecified: Secondary | ICD-10-CM

## 2016-10-23 DIAGNOSIS — Z09 Encounter for follow-up examination after completed treatment for conditions other than malignant neoplasm: Secondary | ICD-10-CM

## 2016-10-23 DIAGNOSIS — R102 Pelvic and perineal pain: Secondary | ICD-10-CM | POA: Diagnosis not present

## 2016-10-23 DIAGNOSIS — I10 Essential (primary) hypertension: Secondary | ICD-10-CM | POA: Diagnosis present

## 2016-10-23 DIAGNOSIS — N400 Enlarged prostate without lower urinary tract symptoms: Secondary | ICD-10-CM | POA: Diagnosis present

## 2016-10-23 DIAGNOSIS — D538 Other specified nutritional anemias: Secondary | ICD-10-CM

## 2016-10-23 DIAGNOSIS — T148XXA Other injury of unspecified body region, initial encounter: Secondary | ICD-10-CM | POA: Diagnosis not present

## 2016-10-23 DIAGNOSIS — E876 Hypokalemia: Secondary | ICD-10-CM | POA: Diagnosis not present

## 2016-10-23 DIAGNOSIS — D500 Iron deficiency anemia secondary to blood loss (chronic): Secondary | ICD-10-CM

## 2016-10-23 DIAGNOSIS — D649 Anemia, unspecified: Secondary | ICD-10-CM | POA: Diagnosis present

## 2016-10-23 DIAGNOSIS — F0390 Unspecified dementia without behavioral disturbance: Secondary | ICD-10-CM | POA: Diagnosis present

## 2016-10-23 DIAGNOSIS — Z818 Family history of other mental and behavioral disorders: Secondary | ICD-10-CM

## 2016-10-23 DIAGNOSIS — Z8249 Family history of ischemic heart disease and other diseases of the circulatory system: Secondary | ICD-10-CM

## 2016-10-23 DIAGNOSIS — Z811 Family history of alcohol abuse and dependence: Secondary | ICD-10-CM

## 2016-10-23 DIAGNOSIS — R52 Pain, unspecified: Secondary | ICD-10-CM

## 2016-10-23 DIAGNOSIS — Z6828 Body mass index (BMI) 28.0-28.9, adult: Secondary | ICD-10-CM

## 2016-10-23 DIAGNOSIS — I739 Peripheral vascular disease, unspecified: Secondary | ICD-10-CM | POA: Diagnosis present

## 2016-10-23 DIAGNOSIS — S299XXA Unspecified injury of thorax, initial encounter: Secondary | ICD-10-CM | POA: Diagnosis not present

## 2016-10-23 DIAGNOSIS — E785 Hyperlipidemia, unspecified: Secondary | ICD-10-CM | POA: Diagnosis present

## 2016-10-23 DIAGNOSIS — I420 Dilated cardiomyopathy: Secondary | ICD-10-CM | POA: Diagnosis present

## 2016-10-23 DIAGNOSIS — Z23 Encounter for immunization: Secondary | ICD-10-CM

## 2016-10-23 DIAGNOSIS — D62 Acute posthemorrhagic anemia: Secondary | ICD-10-CM | POA: Diagnosis not present

## 2016-10-23 DIAGNOSIS — Z87891 Personal history of nicotine dependence: Secondary | ICD-10-CM

## 2016-10-23 DIAGNOSIS — Z833 Family history of diabetes mellitus: Secondary | ICD-10-CM

## 2016-10-23 DIAGNOSIS — S3992XA Unspecified injury of lower back, initial encounter: Secondary | ICD-10-CM | POA: Diagnosis not present

## 2016-10-23 DIAGNOSIS — Z823 Family history of stroke: Secondary | ICD-10-CM

## 2016-10-23 DIAGNOSIS — I8510 Secondary esophageal varices without bleeding: Secondary | ICD-10-CM | POA: Diagnosis present

## 2016-10-23 DIAGNOSIS — R296 Repeated falls: Secondary | ICD-10-CM | POA: Diagnosis present

## 2016-10-23 DIAGNOSIS — I110 Hypertensive heart disease with heart failure: Secondary | ICD-10-CM | POA: Diagnosis present

## 2016-10-23 DIAGNOSIS — Z419 Encounter for procedure for purposes other than remedying health state, unspecified: Secondary | ICD-10-CM

## 2016-10-23 DIAGNOSIS — S3993XA Unspecified injury of pelvis, initial encounter: Secondary | ICD-10-CM | POA: Diagnosis not present

## 2016-10-23 LAB — CBC WITH DIFFERENTIAL/PLATELET
BASOS ABS: 0 10*3/uL (ref 0.0–0.1)
BASOS PCT: 0 %
Eosinophils Absolute: 0 10*3/uL (ref 0.0–0.7)
Eosinophils Relative: 0 %
HEMATOCRIT: 36.7 % — AB (ref 39.0–52.0)
HEMOGLOBIN: 12.9 g/dL — AB (ref 13.0–17.0)
LYMPHS PCT: 19 %
Lymphs Abs: 0.9 10*3/uL (ref 0.7–4.0)
MCH: 30.5 pg (ref 26.0–34.0)
MCHC: 35.1 g/dL (ref 30.0–36.0)
MCV: 86.8 fL (ref 78.0–100.0)
MONO ABS: 0.4 10*3/uL (ref 0.1–1.0)
MONOS PCT: 8 %
NEUTROS ABS: 3.7 10*3/uL (ref 1.7–7.7)
NEUTROS PCT: 73 %
Platelets: 123 10*3/uL — ABNORMAL LOW (ref 150–400)
RBC: 4.23 MIL/uL (ref 4.22–5.81)
RDW: 15.9 % — AB (ref 11.5–15.5)
WBC: 5 10*3/uL (ref 4.0–10.5)

## 2016-10-23 LAB — COMPREHENSIVE METABOLIC PANEL
ALBUMIN: 4 g/dL (ref 3.5–5.0)
ALT: 50 U/L (ref 17–63)
ANION GAP: 16 — AB (ref 5–15)
AST: 80 U/L — ABNORMAL HIGH (ref 15–41)
Alkaline Phosphatase: 103 U/L (ref 38–126)
BUN: 7 mg/dL (ref 6–20)
CALCIUM: 8.6 mg/dL — AB (ref 8.9–10.3)
CHLORIDE: 88 mmol/L — AB (ref 101–111)
CO2: 25 mmol/L (ref 22–32)
Creatinine, Ser: 0.76 mg/dL (ref 0.61–1.24)
GFR calc non Af Amer: >60 mL/min (ref >60)
GLUCOSE: 118 mg/dL — AB (ref 65–99)
POTASSIUM: 4 mmol/L (ref 3.5–5.1)
SODIUM: 129 mmol/L — AB (ref 135–145)
Total Bilirubin: 0.9 mg/dL (ref 0.3–1.2)
Total Protein: 6.9 g/dL (ref 6.5–8.1)

## 2016-10-23 LAB — ETHANOL: Alcohol, Ethyl (B): 316 mg/dL (ref <10)

## 2016-10-23 LAB — PROTIME-INR
INR: 1.11
Prothrombin Time: 14.3 seconds (ref 11.4–15.2)

## 2016-10-23 MED ORDER — HYDROMORPHONE HCL 1 MG/ML IJ SOLN
1.0000 mg | Freq: Once | INTRAMUSCULAR | Status: AC
  Administered 2016-10-23: 1 mg via INTRAVENOUS
  Filled 2016-10-23: qty 1

## 2016-10-23 MED ORDER — THIAMINE HCL 100 MG/ML IJ SOLN
100.0000 mg | Freq: Every day | INTRAMUSCULAR | Status: DC
  Administered 2016-10-23: 100 mg via INTRAVENOUS
  Filled 2016-10-23: qty 2

## 2016-10-23 MED ORDER — LORAZEPAM 1 MG PO TABS: 0.0000 mg | ORAL_TABLET | Freq: Two times a day (BID) | ORAL | Status: DC

## 2016-10-23 MED ORDER — LORAZEPAM 1 MG PO TABS: 0.0000 mg | ORAL_TABLET | Freq: Four times a day (QID) | ORAL | Status: AC

## 2016-10-23 MED ORDER — ONDANSETRON HCL 4 MG/2ML IJ SOLN
4.0000 mg | Freq: Once | INTRAMUSCULAR | Status: AC
  Administered 2016-10-23: 4 mg via INTRAVENOUS
  Filled 2016-10-23: qty 2

## 2016-10-23 MED ORDER — LORAZEPAM 2 MG/ML IJ SOLN
0.0000 mg | Freq: Four times a day (QID) | INTRAMUSCULAR | Status: AC
  Administered 2016-10-25 (×2): 1 mg via INTRAVENOUS
  Filled 2016-10-23: qty 1

## 2016-10-23 MED ORDER — SODIUM CHLORIDE 0.9 % IV SOLN
INTRAVENOUS | Status: DC
  Administered 2016-10-23 – 2016-10-25 (×5): via INTRAVENOUS
  Administered 2016-10-26: 1 mL via INTRAVENOUS
  Administered 2016-10-27 (×2): via INTRAVENOUS

## 2016-10-23 MED ORDER — FENTANYL CITRATE (PF) 100 MCG/2ML IJ SOLN
50.0000 ug | INTRAMUSCULAR | Status: AC | PRN
  Administered 2016-10-23 (×2): 50 ug via INTRAVENOUS
  Filled 2016-10-23 (×2): qty 2

## 2016-10-23 MED ORDER — VITAMIN B-1 100 MG PO TABS: 100.0000 mg | ORAL_TABLET | Freq: Every day | ORAL | Status: DC

## 2016-10-23 MED ORDER — LORAZEPAM 2 MG/ML IJ SOLN
0.0000 mg | Freq: Two times a day (BID) | INTRAMUSCULAR | Status: DC
  Administered 2016-10-26: 2 mg via INTRAVENOUS
  Filled 2016-10-23: qty 1

## 2016-10-23 NOTE — ED Provider Notes (Signed)
[facility] DEPT Provider Note   CSN: [id number] Arrival date & time: 10/23/16  1958     History   Chief Complaint Chief Complaint  Patient presents with  . Fall    HPI [name] is a 71 y.o. male with a hx of HTN, anxiety, atrial tachycardia, BPH, alcoholism, C2 fracture, alcoholic cirrhosis, dilated cardiomyopathy, syncope  presents to the Emergency Department complaining of acute, persistent left hip pain after mechanical fall onset around 5:30 PM. Patient reports he attempted to sit down in a chair with wheels when the chair went out from behind him. He reports he fell forward striking the front of his head on the ground and then his left hip. He has been unable to walk since the fall. He denies syncope or loss of consciousness. He reports associated midline L-spine pain and no pain  Or tingling in his lower extremities.  Family at bedside reports that patient often drinks 1-2 bottles of wine per day.  The history is provided by the patient and medical records. No language interpreter was used.    Past Medical History:  Diagnosis Date  . Adenomatous polyp of colon 2006  . Anemia 2012  . Anxiety   . Arthritis    "left arm/shoulder" (05/17/2016)  . Atrial tachycardia, paroxysmal ([place]) 04/07/2015  . BPH (benign prostatic hypertrophy) 5/99  . Chronic bronchitis ([place])    "q year or q other year" (11/02/2011)  . Chronic nausea    "a few days/week" (05/17/2016)  . Colon polyp   . DCM (dilated cardiomyopathy) ([place])    EF 45-50% by echo and cath 2017  . Depression    Hx of  . Falls frequently    "daily lately; legs just give out" (11/02/2011; 05/17/2016)  . GERD (gastroesophageal reflux disease) 07/31/04   gastritis   . H/O hiatal hernia   . Hard of hearing   . Heart murmur   . Hernia, umbilical    "unrepaired" (11/02/2011; 05/17/2016)  . History of blood transfusion 2012  . HLD (hyperlipidemia) 5/99  . HTN (hypertension) 5/99  . Memory loss    "recently has been  forgetting alot; maybe related to the alcohol" (11/02/2011; 05/17/2016)  . Mild CAD    10% RCA  . PAC (premature atrial contraction) 04/07/2015  . Peripheral vascular disease ([place])   . Pneumonia 1996   hosp  . Pollen allergy    pollen/ragweed  . Seasonal allergies     Patient Active Problem List   Diagnosis Date Noted  . Closed displaced fracture of left femoral neck ([place]) 10/23/2016  . Contracture of muscle of left hand 09/15/2016  . Infected laceration 07/08/2016  . Generalized muscle weakness 07/08/2016  . Anemia 05/18/2016  . Syncope and collapse   . Syncope 05/17/2016  . Cellulitis 05/17/2016  . Rib fractures 05/17/2016  . Bullous pemphigoid 05/17/2016  . ETOH abuse 03/17/2016  . Dizziness and giddiness 12/09/2015  . Chronic pain syndrome 12/09/2015  . Arthritis 11/20/2015  . Mild CAD   . DCM (dilated cardiomyopathy) ([place])   . Abnormal nuclear stress test 05/07/2015  . PAC (premature atrial contraction) 04/07/2015  . Atrial tachycardia, paroxysmal ([place]) 03/10/2015  . Depression 04/12/2012  . C2 cervical fracture ([place]) 02/29/2012  . Fracture of humerus, proximal, left, closed 11/02/2011  . Hyponatremia 11/17/2010  . H/O: upper GI bleed 10/20/2010  . Stomach ulcer from aspirin/ibuprofen-like drugs (NSAID's) 10/20/2010  . Colon polyp 08/26/2010  . Diverticulosis of colon (without mention of hemorrhage) 08/26/2010  .  BPH (benign prostatic hyperplasia) 07/02/2010  . Deficiency anemia 03/09/2010  . Hyposmolality and/or hyponatremia 10/17/2009  . ALCOHOLIC CIRRHOSIS OF LIVER 07/25/2009  . Anxiety state 01/24/2007  . HLD (hyperlipidemia) 07/28/2006  . Alcohol abuse 07/28/2006  . Essential hypertension 07/28/2006  . IMPOTENCE, ORGANIC ORIGIN 07/28/2006    Past Surgical History:  Procedure Laterality Date  . BACK SURGERY    . CARDIAC CATHETERIZATION N/A 05/23/2015   Procedure: Left Heart Cath and Coronary Angiography;  Surgeon: Troy Sine, MD;  Location: [facility]  [facility];  Service: Cardiovascular;  Laterality: N/A;  . CATARACT EXTRACTION W/ INTRAOCULAR LENS  IMPLANT, BILATERAL Bilateral 10/2002  . COLONOSCOPY  07/31/04   multiple polyps; repeat in 3 years  . DUPUYTREN CONTRACTURE RELEASE Left   . ETT myoview  03/09/07   nml EF 66%  . FRACTURE SURGERY    . HEMORRHOID SURGERY     "cauterized a long time ago" (11/02/2011)  . hosp CP R/O'D 2/24  03/08/07  . neck MRI  6/04   C5-6 disc abnormality  . ORIF HUMERUS FRACTURE  11/04/2011   Procedure: OPEN REDUCTION INTERNAL FIXATION (ORIF) PROXIMAL HUMERUS FRACTURE;  Surgeon: Rozanna Box, MD;  Location: [facility];  Service: Orthopedics;  Laterality: Left;  . POSTERIOR CERVICAL FUSION/FORAMINOTOMY N/A 03/21/2012   Procedure: POSTERIOR CERVICAL FUSION/FORAMINOTOMY LEVEL ONE;  Surgeon: Charlie Pitter, MD;  Location: [facility];  Service: Neurosurgery;  Laterality: N/A;  POSTERIOR CERVICAL ONE TO CERVICAL TWO FUSION WITH ILIAC CREST GRAFT AND LATERAL MASS SCREWS   . TONSILLECTOMY     "I was young" (11/02/2011)  . UPPER GASTROINTESTINAL ENDOSCOPY         Home Medications    Prior to Admission medications   Medication Sig Start Date End Date Taking? Authorizing Provider  cetirizine (ZYRTEC) 10 MG tablet Take 1 tablet (10 mg total) by mouth daily. 09/15/16  Yes Lucille Passy, MD  Cyanocobalamin (VITAMIN B-12 PO) Take 1 tablet by mouth daily.   Yes [provider]  cyclobenzaprine (FLEXERIL) 10 MG tablet Take 1 tablet (10 mg total) by mouth 3 (three) times daily as needed for muscle spasms. 09/15/16  Yes Lucille Passy, MD  docusate sodium (COLACE) 100 MG capsule Take 1 capsule (100 mg total) by mouth daily as needed for mild constipation. 05/26/16  Yes Ngetich, Dinah C, NP  ferrous sulfate 325 (65 FE) MG tablet Take 325 mg by mouth daily with breakfast.   Yes [provider]  furosemide (LASIX) 40 MG tablet Take 1 tablet (40 mg total) by mouth daily. 09/15/16 09/15/17 Yes Lucille Passy, MD  lidocaine  (LIDODERM) 5 % Place 1 patch onto the skin daily. Remove & Discard patch within 12 hours or as directed by MD Patient taking differently: Place 1 patch onto the skin daily as needed (pain). Remove & Discard patch within 12 hours or as directed by MD 05/11/16  Yes Lucille Passy, MD  oxyCODONE (OXY IR/ROXICODONE) 5 MG immediate release tablet Take 1 tablet (5 mg total) by mouth every 6 (six) hours as needed for severe pain. 09/15/16  Yes Lucille Passy, MD  polyethylene glycol [facility] / Floria Raveling) packet Take 17 g by mouth daily. Patient taking differently: Take 17 g by mouth daily as needed for mild constipation.  05/23/16  Yes Rosita Fire, MD  potassium chloride SA (KLOR-CON M15) 15 MEQ tablet Take 2 tablets (30 mEq total) by mouth daily. Patient taking differently: Take 15 mEq by mouth  daily.  09/15/16  Yes Lucille Passy, MD  prednisoLONE acetate (PRED FORTE) 1 % ophthalmic suspension Place 1 drop into both eyes 3 (three) times daily.   Yes [provider]  predniSONE (DELTASONE) 10 MG tablet Take 5 mg by mouth daily with breakfast.    Yes [provider]  promethazine (PHENERGAN) 25 MG tablet TAKE 1 TABLET DAILY AS NEEDED FOR NAUSEA 03/09/16  Yes Lucille Passy, MD  QUEtiapine (SEROQUEL) 300 MG tablet Take 1 tablet (300 mg total) by mouth at bedtime. 07/08/16  Yes Lucille Passy, MD  Thiamine HCl (VITAMIN B-1) 100 MG tablet Take 100 mg by mouth daily. Reported on 02/17/2015   Yes [provider]  TOPROL XL 25 MG 24 hr tablet TAKE 1 TABLET DAILY 05/11/16  Yes Turner, Eber Hong, MD  traZODone (DESYREL) 50 MG tablet Take 1 tablet (50 mg total) by mouth at bedtime. 07/08/16  Yes Lucille Passy, MD    Family History Family History  Problem Relation Age of Onset  . Heart disease Father   . Diabetes Father   . Stroke Father   . Depression Mother   . Heart disease Brother   . Alcohol abuse Brother   . Liver disease Brother     Social History Social History  Substance Use  Topics  . Smoking status: Former Smoker    Types: Pipe  . Smokeless tobacco: Never Used     Comment: 05/17/2016 "stopped in the late 1990s"  . Alcohol use 67.2 oz/week    112 Glasses of wine per week     Comment: 11/02/2011 "large box of wine q 2 days" 01-27-15 2 glass a day of wine; 05/17/2016 "1.75L bottles; 1- 1 1/2 bottles/day; somedays worse than others; at least 3-4 days/week he'll have 1 1/2 bottles; last drink was 05/16/2016" (05/17/2016)     Allergies   Nifedipine   Review of Systems Review of Systems  Constitutional: Negative for appetite change, diaphoresis, fatigue, fever and unexpected weight change.  HENT: Negative for mouth sores.   Eyes: Negative for visual disturbance.  Respiratory: Negative for cough, chest tightness, shortness of breath and wheezing.   Cardiovascular: Negative for chest pain.  Gastrointestinal: Negative for abdominal pain, constipation, diarrhea, nausea and vomiting.  Endocrine: Negative for polydipsia, polyphagia and polyuria.  Genitourinary: Negative for dysuria, frequency, hematuria and urgency.  Musculoskeletal: Positive for arthralgias and back pain. Negative for neck stiffness.  Skin: Negative for rash.  Allergic/Immunologic: Negative for immunocompromised state.  Neurological: Negative for syncope, light-headedness and headaches.  Hematological: Does not bruise/bleed easily.  Psychiatric/Behavioral: Negative for sleep disturbance. The patient is not nervous/anxious.      Physical Exam Updated Vital Signs BP 137/82 (BP Location: Right Arm)   Pulse 89   Temp 98.3 F (36.8 C) (Oral)   Resp 15   Ht 5\' 7"  (1.702 m)   Wt 81.6 kg (180 lb)   SpO2 93%   BMI 28.19 kg/m   Physical Exam  Constitutional: He appears well-developed and well-nourished. No distress.  Awake, alert, nontoxic appearance  HENT:  Head: Normocephalic and atraumatic.  Mouth/Throat: Oropharynx is clear and moist. No oropharyngeal exudate.  Poor dentition  Eyes:  Conjunctivae are normal. No scleral icterus.  Neck: Normal range of motion. Neck supple. No spinous process tenderness and no muscular tenderness present. Normal range of motion present.  Cardiovascular: Normal rate, regular rhythm and intact distal pulses.   Pulses:      Radial pulses are 2+ on  the right side, and 2+ on the left side.       Dorsalis pedis pulses are 2+ on the right side, and 2+ on the left side.  Pulmonary/Chest: Effort normal and breath sounds normal. No respiratory distress. He has no wheezes.  Equal chest expansion  Abdominal: Soft. Bowel sounds are normal. He exhibits no mass. There is no tenderness. There is no rebound and no guarding.  Musculoskeletal: He exhibits no edema.       Left hip: He exhibits decreased range of motion, decreased strength, tenderness and bony tenderness.  No midline tenderness to the C-spine or T-spine Mild midline tenderness to the L-spine. No paraspinal tenderness to the T-spine or L-spine. Tenderness to palpation and ecchymosis to the lateral portion of the left hip, minimal movement  Neurological: He is alert.  Speech is clear and goal oriented Moves extremities without ataxia Strength 5/5 in the right hip knee ankle and great toe Strength unable to be tested in the left hip and knee; 5/5 with dorsal flexion and plantar flexion and in the great toe  Skin: Skin is warm and dry. He is not diaphoretic.  Psychiatric: He has a normal mood and affect.  Nursing note and vitals reviewed.    ED Treatments / Results  Labs (all labs ordered are listed, but only abnormal results are displayed) Labs Reviewed  CBC WITH DIFFERENTIAL/PLATELET - Abnormal; Notable for the following:       Result Value   Hemoglobin 12.9 (*)    HCT 36.7 (*)    RDW 15.9 (*)    Platelets 123 (*)    All other components within normal limits  COMPREHENSIVE METABOLIC PANEL - Abnormal; Notable for the following:    Sodium 129 (*)    Chloride 88 (*)    Glucose, Bld  118 (*)    Calcium 8.6 (*)    AST 80 (*)    Anion gap 16 (*)    All other components within normal limits  ETHANOL - Abnormal; Notable for the following:    Alcohol, Ethyl (B) 316 (*)    All other components within normal limits  PROTIME-INR  TYPE AND SCREEN    EKG  EKG Interpretation  Date/Time:  Saturday October 23 2016 20:15:27 EDT Ventricular Rate:  93 PR Interval:    QRS Duration: 113 QT Interval:  372 QTC Calculation: 445 R Axis:   81 Text Interpretation:  Sinus rhythm Supraventricular bigeminy Borderline prolonged PR interval Anterior infarct, old Baseline wander in lead(s) V3 V6 Confirmed by Milton Ferguson [phone]) on 10/23/2016 10:39:35 PM       Radiology Dg Chest 1 View  Result Date: 10/23/2016 CLINICAL DATA:  FALL today- pt fell while trying to sit in a rolling chair. Left hip pain. Hx left shoulder surgery. EXAM: CHEST 1 VIEW COMPARISON:  05/20/2016 FINDINGS: Normal cardiac silhouette. Lungs are clear. No effusion, infiltrate pneumothorax. Remote LEFT rib fractures noted. IMPRESSION: No acute cardiopulmonary process. Electronically Signed   By: Suzy Bouchard M.D.   On: 10/23/2016 21:07   Dg Lumbar Spine 2-3 Views  Result Date: 10/23/2016 CLINICAL DATA:  71 year old male status post fall today with pain radiating to the left hip. EXAM: LUMBAR SPINE - 2-3 VIEW COMPARISON:  CT Abdomen and Pelvis 05/18/2016. FINDINGS: Normal lumbar segmentation. Stable mild L1 compression fracture. Other lumbar vertebral height and alignment is preserved. Vacuum disc at L3-L4. T12 level appears intact. Grossly intact visible sacrum. Calcified atherosclerosis in the abdomen. IMPRESSION: 1.  No acute fracture  or listhesis in the lumbar spine. 2. Mild chronic L1 compression fracture. Electronically Signed   By: Genevie Ann M.D.   On: 10/23/2016 21:11   Dg Pelvis 1-2 Views  Result Date: 10/23/2016 CLINICAL DATA:  71 year old male status post fall today with left hip pain. EXAM: PELVIS - 1-2  VIEW COMPARISON:  Left femur series today reported separately. CT Abdomen and Pelvis 05/18/2016. FINDINGS: Left femoral neck fracture re- demonstrated. Femoral heads remain normally located. Proximal right femur appears intact. Intact pelvis. Calcified iliofemoral atherosclerosis. Negative visible bowel gas pattern. IMPRESSION: 1. Left femoral neck fracture, see left femur comparison. 2. No superimposed pelvis fracture. Electronically Signed   By: Genevie Ann M.D.   On: 10/23/2016 21:09   Ct Head Wo Contrast  Result Date: 10/23/2016 CLINICAL DATA:  C-spine trauma, high clinical risk (NEXUS/CCR) Head trauma, minor, GCS>=13, high clinical risk, initial exam. Pt brought in by EMS due to falling while trying to sit in rolling chair. EXAM: CT HEAD WITHOUT CONTRAST CT CERVICAL SPINE WITHOUT CONTRAST TECHNIQUE: Multidetector CT imaging of the head and cervical spine was performed following the standard protocol without intravenous contrast. Multiplanar CT image reconstructions of the cervical spine were also generated. COMPARISON:  None. FINDINGS: CT HEAD FINDINGS Brain: No intracranial hemorrhage. No parenchymal contusion. No midline shift or mass effect. Basilar cisterns are patent. No skull base fracture. No fluid in the paranasal sinuses or mastoid air cells. Orbits are normal. There are periventricular and subcortical white matter hypodensities. Generalized cortical atrophy. Vascular: No hyperdense vessel or unexpected calcification. Skull: Normal. Negative for fracture or focal lesion. Sinuses/Orbits: Paranasal sinuses and mastoid air cells are clear. Orbits are clear. Other: None. CT CERVICAL SPINE FINDINGS Alignment: Normal alignment. Skull base and vertebrae: Remote fracture of the dense without subluxation. Cerclage wire posteriorly at C1-C2. Bilateral pedicle screws at C1-C2. No evidence acute fracture dislocation. Soft tissues and spinal canal: No prevertebral fluid or swelling. No visible canal hematoma. Disc  levels: Mild disc space narrowing in the lower cervical spine with osteophytosis Upper chest: Clear Other: None IMPRESSION: 1. No acute intracranial trauma. 2. Atrophy and microvascular disease. 3. No acute cervical spine fracture. 4. Stable fracture of the dens with posterior fusion at C1 -C2. Electronically Signed   By: Suzy Bouchard M.D.   On: 10/23/2016 21:31   Ct Cervical Spine Wo Contrast  Result Date: 10/23/2016 CLINICAL DATA:  C-spine trauma, high clinical risk (NEXUS/CCR) Head trauma, minor, GCS>=13, high clinical risk, initial exam. Pt brought in by EMS due to falling while trying to sit in rolling chair. EXAM: CT HEAD WITHOUT CONTRAST CT CERVICAL SPINE WITHOUT CONTRAST TECHNIQUE: Multidetector CT imaging of the head and cervical spine was performed following the standard protocol without intravenous contrast. Multiplanar CT image reconstructions of the cervical spine were also generated. COMPARISON:  None. FINDINGS: CT HEAD FINDINGS Brain: No intracranial hemorrhage. No parenchymal contusion. No midline shift or mass effect. Basilar cisterns are patent. No skull base fracture. No fluid in the paranasal sinuses or mastoid air cells. Orbits are normal. There are periventricular and subcortical white matter hypodensities. Generalized cortical atrophy. Vascular: No hyperdense vessel or unexpected calcification. Skull: Normal. Negative for fracture or focal lesion. Sinuses/Orbits: Paranasal sinuses and mastoid air cells are clear. Orbits are clear. Other: None. CT CERVICAL SPINE FINDINGS Alignment: Normal alignment. Skull base and vertebrae: Remote fracture of the dense without subluxation. Cerclage wire posteriorly at C1-C2. Bilateral pedicle screws at C1-C2. No evidence acute fracture dislocation. Soft tissues and spinal canal:  No prevertebral fluid or swelling. No visible canal hematoma. Disc levels: Mild disc space narrowing in the lower cervical spine with osteophytosis Upper chest: Clear Other:  None IMPRESSION: 1. No acute intracranial trauma. 2. Atrophy and microvascular disease. 3. No acute cervical spine fracture. 4. Stable fracture of the dens with posterior fusion at C1 -C2. Electronically Signed   By: Suzy Bouchard M.D.   On: 10/23/2016 21:31   Dg Femur Min 2 Views Left  Result Date: 10/23/2016 CLINICAL DATA:  71 year old male status post fall today with left hip pain. EXAM: LEFT FEMUR 2 VIEWS COMPARISON:  CT Abdomen and Pelvis 05/18/2016. FINDINGS: Left femoral neck fracture with varus impaction (arrows). The intertrochanteric segment of the left femur appears intact. Left femoral head remains normally located. Grossly intact visible left hemipelvis. The more distal left femur appears intact. Preserved alignment at the left knee. Calcified peripheral vascular disease. Medial compartment knee joint degeneration. Possible small knee joint effusion. IMPRESSION: 1. Left femoral neck fracture with varus impaction. 2. No other left femur fracture identified. 3. Possible left knee joint effusion. 4. Calcified peripheral vascular disease. Electronically Signed   By: Genevie Ann M.D.   On: 10/23/2016 21:08    Procedures Procedures (including critical care time)  Medications Ordered in ED Medications  0.9 %  sodium chloride infusion ( Intravenous New Bag/Given 10/23/16 2149)  LORazepam (ATIVAN) injection 0-4 mg (0 mg Intravenous Not Given 10/23/16 2201)    Or  LORazepam (ATIVAN) tablet 0-4 mg ( Oral See Alternative 10/23/16 2201)  LORazepam (ATIVAN) injection 0-4 mg (not administered)    Or  LORazepam (ATIVAN) tablet 0-4 mg (not administered)  thiamine (VITAMIN B-1) tablet 100 mg ( Oral See Alternative 10/23/16 2214)    Or  thiamine (B-1) injection 100 mg (100 mg Intravenous Given 10/23/16 2214)  fentaNYL (SUBLIMAZE) injection 50 mcg (50 mcg Intravenous Given 10/23/16 2140)  HYDROmorphone (DILAUDID) injection 1 mg (1 mg Intravenous Given 10/23/16 2214)  ondansetron (ZOFRAN) injection 4  mg (4 mg Intravenous Given 10/23/16 2227)     Initial Impression / Assessment and Plan / ED Course  I have reviewed the triage vital signs and the nursing notes.  Pertinent labs & imaging results that were available during my care of the patient were reviewed by me and considered in my medical decision making (see chart for details).  Clinical Course as of Oct 24 2347  Sat Oct 23, 2016  2312 Discussed with Dr. Lyla Glassing who will evaluate  [HM]  2347 Discussed with Dr. Marvis Repress who will admit.  [HM]    Clinical Course User Index [HM] Deke Tilghman, Jarrett Soho, PA-C     Pt presents with left hip pain after fall and x-ray with Left femoral neck fracture with varus impaction.  L spine with chronic L1 compression fracture.  Pt reports heavy EtOH usage daily and family confirms this.  Pt will need admission for repair.  Discussed with patient and family who are in agreement with this plan.    CIWA protocol initiated.      Final Clinical Impressions(s) / ED Diagnoses   Final diagnoses:  Fall, initial encounter  Closed fracture of left hip, initial encounter ([place])  Alcoholism /alcohol abuse ([place])  Other specified nutritional anemias    New Prescriptions New Prescriptions   No medications on file     Agapito Games 10/23/16 [place]    Milton Ferguson, MD 10/24/16 1434

## 2016-10-23 NOTE — ED Triage Notes (Addendum)
Pt brought in by EMS due to falling while trying to sit in rolling chair. Pt c/o left hip pain. Pt did hit his head. No collar applied by ems. Per EMS, pt does not take blood thinners. Pt a&ox4.

## 2016-10-23 NOTE — ED Notes (Signed)
Patient transported to X-ray 

## 2016-10-23 NOTE — Consult Note (Signed)
Consult acknowledged.  Patient has displaced left femoral neck fracture.  Patient will require surgery for left hip.  Surgery tentatively planned for tomorrow.  Nothing by mouth after midnight.  Hold chemical DVT prophylaxis.  Full consult note to follow.

## 2016-10-23 NOTE — H&P (Signed)
[name] VOH:[id number] DOB: 10-Jul-1945 DOA: 10/23/2016     PCP: Sean Passy, MD   Outpatient Specialists: [facility] Patient coming from:    home Lives  With family    Chief Complaint: hip pain  HPI: [name] is a 71 y.o. male with medical history significant of ETOH abuse, multiple fractures, HTN, GERD, HLD, Dementia    Presented with fall tripped over roling chair a few times fell face first one time with the next fall could not get up. He drinks about 2 bottles of wine a day. No chest pain no shortness. No dyspnea on exertion. Reports able to walk up a stairs      Regarding pertinent Chronic problems:  Hx of possible liver disease family statesin the past had to have fluid removed May 2018 ECho 55-60%  May 2017 cARDIAC CATH Mild diffuse hypokinesis with an overall ejection fraction of 45-50% without definitive focal segmental wall motion abnormalitiesMild diffuse hypokinesis with an overall ejection fraction of 45-50% without definitive focal segmental wall motion abnormalities  IN ER:  Temp (24hrs), Avg:98.3 F (36.8 C), Min:98.3 F (36.8 C), Max:98.3 F (36.8 C)      on arrival  ED Triage Vitals [10/23/16 2017]  Enc Vitals Group     BP 137/82     Pulse Rate 89     Resp 15     Temp 98.3 F (36.8 C)     Temp Source Oral     SpO2 93 %     Weight 180 lb (81.6 kg)     Height 5\' 7"  (1.702 m)     Head Circumference      Peak Flow      Pain Score 10     Pain Loc      Pain Edu?      Excl. in [place]?     Latest RR 12 98% HR 77 138/81 Na 129 K 4.0  EtOh 316 WBC 5.0 Hg 12.9 PLt 123 INR 1.11 CT head/neck non-acute  Following Medications were ordered in ER: Medications  0.9 %  sodium chloride infusion ( Intravenous New Bag/Given 10/23/16 2149)  LORazepam (ATIVAN) injection 0-4 mg (0 mg Intravenous Not Given 10/23/16 2201)    Or  LORazepam (ATIVAN) tablet 0-4 mg ( Oral See Alternative 10/23/16 2201)  LORazepam (ATIVAN) injection 0-4 mg (not  administered)    Or  LORazepam (ATIVAN) tablet 0-4 mg (not administered)  thiamine (VITAMIN B-1) tablet 100 mg ( Oral See Alternative 10/23/16 2214)    Or  thiamine (B-1) injection 100 mg (100 mg Intravenous Given 10/23/16 2214)  fentaNYL (SUBLIMAZE) injection 50 mcg (50 mcg Intravenous Given 10/23/16 2140)  HYDROmorphone (DILAUDID) injection 1 mg (1 mg Intravenous Given 10/23/16 2214)  ondansetron (ZOFRAN) injection 4 mg (4 mg Intravenous Given 10/23/16 2227)     ER provider discussed case with: Orthopedics Dr. Lyla Glassing Who recommends: admission to medicine plan to operate in AM  [facility] see patient in consult in the morning    Hospitalist was called for admission for Left hip fracture  Review of Systems:    Pertinent positives include: hip pain nausea, vomiting,  Constitutional:  No weight loss, night sweats, Fevers, chills, fatigue, weight loss  HEENT:  No headaches, Difficulty swallowing,Tooth/dental problems,Sore throat,  No sneezing, itching, ear ache, nasal congestion, post nasal drip,  Cardio-vascular:  No chest pain, Orthopnea, PND, anasarca, dizziness, palpitations. no Bilateral lower extremity swelling  GI:  No heartburn, indigestion, abdominal pain,  diarrhea, change in bowel habits, loss of appetite, melena, blood in stool, hematemesis Resp:  no shortness of breath at rest. No dyspnea on exertion, No excess mucus, no productive cough, No non-productive cough, No coughing up of blood.No change in color of mucus.No wheezing. Skin:  no rash or lesions. No jaundice GU:  no dysuria, change in color of urine, no urgency or frequency. No straining to urinate.  No flank pain.  Musculoskeletal:  No joint pain or no joint swelling. No decreased range of motion. No back pain.  Psych:  No change in mood or affect. No depression or anxiety. No memory loss.  Neuro: no localizing neurological complaints, no tingling, no weakness, no double vision, no gait abnormality, no slurred  speech, no confusion  As per HPI otherwise 10 point review of systems negative.   Past Medical History: Past Medical History:  Diagnosis Date  . Adenomatous polyp of colon 2006  . Anemia 2012  . Anxiety   . Arthritis    "left arm/shoulder" (05/17/2016)  . Atrial tachycardia, paroxysmal ([place]) 04/07/2015  . BPH (benign prostatic hypertrophy) 5/99  . Chronic bronchitis ([place])    "q year or q other year" (11/02/2011)  . Chronic nausea    "a few days/week" (05/17/2016)  . Colon polyp   . DCM (dilated cardiomyopathy) ([place])    EF 45-50% by echo and cath 2017  . Depression    Hx of  . Falls frequently    "daily lately; legs just give out" (11/02/2011; 05/17/2016)  . GERD (gastroesophageal reflux disease) 07/31/04   gastritis   . H/O hiatal hernia   . Hard of hearing   . Heart murmur   . Hernia, umbilical    "unrepaired" (11/02/2011; 05/17/2016)  . History of blood transfusion 2012  . HLD (hyperlipidemia) 5/99  . HTN (hypertension) 5/99  . Memory loss    "recently has been forgetting alot; maybe related to the alcohol" (11/02/2011; 05/17/2016)  . Mild CAD    10% RCA  . PAC (premature atrial contraction) 04/07/2015  . Peripheral vascular disease ([place])   . Pneumonia 1996   hosp  . Pollen allergy    pollen/ragweed  . Seasonal allergies    Past Surgical History:  Procedure Laterality Date  . BACK SURGERY    . CARDIAC CATHETERIZATION N/A 05/23/2015   Procedure: Left Heart Cath and Coronary Angiography;  Surgeon: Troy Sine, MD;  Location: [place] [facility];  Service: Cardiovascular;  Laterality: N/A;  . CATARACT EXTRACTION W/ INTRAOCULAR LENS  IMPLANT, BILATERAL Bilateral 10/2002  . COLONOSCOPY  07/31/04   multiple polyps; repeat in 3 years  . DUPUYTREN CONTRACTURE RELEASE Left   . ETT myoview  03/09/07   nml EF 66%  . FRACTURE SURGERY    . HEMORRHOID SURGERY     "cauterized a long time ago" (11/02/2011)  . hosp CP R/O'D 2/24  03/08/07  . neck MRI  6/04   C5-6 disc abnormality    . ORIF HUMERUS FRACTURE  11/04/2011   Procedure: OPEN REDUCTION INTERNAL FIXATION (ORIF) PROXIMAL HUMERUS FRACTURE;  Surgeon: Rozanna Box, MD;  Location: [facility];  Service: Orthopedics;  Laterality: Left;  . POSTERIOR CERVICAL FUSION/FORAMINOTOMY N/A 03/21/2012   Procedure: POSTERIOR CERVICAL FUSION/FORAMINOTOMY LEVEL ONE;  Surgeon: Charlie Pitter, MD;  Location: [place] [facility];  Service: Neurosurgery;  Laterality: N/A;  POSTERIOR CERVICAL ONE TO CERVICAL TWO FUSION WITH ILIAC CREST GRAFT AND LATERAL MASS SCREWS   . TONSILLECTOMY     "I  was young" (11/02/2011)  . UPPER GASTROINTESTINAL ENDOSCOPY       Social History:  Ambulatory  independently     reports that he has quit smoking. His smoking use included Pipe. He has never used smokeless tobacco. He reports that he drinks about 67.2 oz of alcohol per week . He reports that he does not use drugs.  Allergies:   Allergies  Allergen Reactions  . Nifedipine Swelling    REACTION: SWELLING, 11/02/2011 pt denies this allergy.       Family History:   Family History  Problem Relation Age of Onset  . Heart disease Father   . Diabetes Father   . Stroke Father   . Depression Mother   . Heart disease Brother   . Alcohol abuse Brother   . Liver disease Brother     Medications: Prior to Admission medications   Medication Sig Start Date End Date Taking? Authorizing Provider  cetirizine (ZYRTEC) 10 MG tablet Take 1 tablet (10 mg total) by mouth daily. 09/15/16  Yes Sean Passy, MD  Cyanocobalamin (VITAMIN B-12 PO) Take 1 tablet by mouth daily.   Yes [provider]  cyclobenzaprine (FLEXERIL) 10 MG tablet Take 1 tablet (10 mg total) by mouth 3 (three) times daily as needed for muscle spasms. 09/15/16  Yes Sean Passy, MD  docusate sodium (COLACE) 100 MG capsule Take 1 capsule (100 mg total) by mouth daily as needed for mild constipation. 05/26/16  Yes Ngetich, Dinah C, NP  ferrous sulfate 325 (65 FE) MG tablet Take 325 mg by mouth  daily with breakfast.   Yes [provider]  furosemide (LASIX) 40 MG tablet Take 1 tablet (40 mg total) by mouth daily. 09/15/16 09/15/17 Yes Sean Passy, MD  lidocaine (LIDODERM) 5 % Place 1 patch onto the skin daily. Remove & Discard patch within 12 hours or as directed by MD Patient taking differently: Place 1 patch onto the skin daily as needed (pain). Remove & Discard patch within 12 hours or as directed by MD 05/11/16  Yes Sean Passy, MD  oxyCODONE (OXY IR/ROXICODONE) 5 MG immediate release tablet Take 1 tablet (5 mg total) by mouth every 6 (six) hours as needed for severe pain. 09/15/16  Yes Sean Passy, MD  polyethylene glycol [facility] Floria Raveling) packet Take 17 g by mouth daily. Patient taking differently: Take 17 g by mouth daily as needed for mild constipation.  05/23/16  Yes Rosita Fire, MD  potassium chloride SA (KLOR-CON M15) 15 MEQ tablet Take 2 tablets (30 mEq total) by mouth daily. Patient taking differently: Take 15 mEq by mouth daily.  09/15/16  Yes Sean Passy, MD  prednisoLONE acetate (PRED FORTE) 1 % ophthalmic suspension Place 1 drop into both eyes 3 (three) times daily.   Yes [provider]  predniSONE (DELTASONE) 10 MG tablet Take 5 mg by mouth daily with breakfast.    Yes [provider]  promethazine (PHENERGAN) 25 MG tablet TAKE 1 TABLET DAILY AS NEEDED FOR NAUSEA 03/09/16  Yes Sean Passy, MD  QUEtiapine (SEROQUEL) 300 MG tablet Take 1 tablet (300 mg total) by mouth at bedtime. 07/08/16  Yes Sean Passy, MD  Thiamine HCl (VITAMIN B-1) 100 MG tablet Take 100 mg by mouth daily. Reported on 02/17/2015   Yes [provider]  TOPROL XL 25 MG 24 hr tablet TAKE 1 TABLET DAILY 05/11/16  Yes Turner, Eber Hong, MD  traZODone (DESYREL) 50 MG tablet Take  1 tablet (50 mg total) by mouth at bedtime. 07/08/16  Yes Sean Passy, MD    Physical Exam: Patient Vitals for the past 24 hrs:  BP Temp Temp src Pulse Resp SpO2 Height Weight  10/23/16  2300 (!) 123/92 - - 79 13 100 % - -  10/23/16 2245 127/82 - - 85 20 94 % - -  10/23/16 2215 128/68 - - 90 16 94 % - -  10/23/16 2145 135/89 - - 87 15 95 % - -  10/23/16 2130 (!) 133/93 - - 83 17 96 % - -  10/23/16 2115 138/80 - - 87 15 96 % - -  10/23/16 2045 133/85 - - 85 12 95 % - -  10/23/16 2030 122/75 - - 89 16 97 % - -  10/23/16 2017 137/82 98.3 F (36.8 C) Oral 89 15 93 % 5\' 7"  (1.702 m) 81.6 kg (180 lb)    1. General:  in No Acute distress   Chronically ill  -appearing 2. Psychological: Alert and   Oriented to self, place 3. Head/ENT:   Moist   Mucous Membranes                          Head Non traumatic, neck supple                           Poor Dentition 4. SKIN: normal   Skin turgor,  Skin clean Dry and intact no rash 5. Heart: Regular rate and rhythm no  Murmur, no Rub or gallop 6. Lungs:  Clear to auscultation bilaterally, no wheezes or crackles   7. Abdomen: Soft,  non-tender,  Distended  obese  bowel sounds present 8. Lower extremities: no clubbing, cyanosis, or edema 9. Neurologically Grossly intact, moving all 4 extremities equally  tremulous  10. MSK: Normal range of motion   body mass index is 28.19 kg/m.  Labs on Admission:   Labs on Admission: I have personally reviewed following labs and imaging studies  CBC:  Recent Labs Lab 10/23/16 2014  WBC 5.0  NEUTROABS 3.7  HGB 12.9*  HCT 36.7*  MCV 86.8  PLT 811*   Basic Metabolic Panel:  Recent Labs Lab 10/23/16 2032  NA 129*  K 4.0  CL 88*  CO2 25  GLUCOSE 118*  BUN 7  CREATININE 0.76  CALCIUM 8.6*   GFR: Estimated Creatinine Clearance: 87.9 mL/min (by C-G formula based on SCr of 0.76 mg/dL). Liver Function Tests:  Recent Labs Lab 10/23/16 2032  AST 80*  ALT 50  ALKPHOS 103  BILITOT 0.9  PROT 6.9  ALBUMIN 4.0   No results for input(s): LIPASE, AMYLASE in the last 168 hours. No results for input(s): AMMONIA in the last 168 hours. Coagulation Profile:  Recent Labs Lab  10/23/16 2014  INR 1.11   Cardiac Enzymes: No results for input(s): CKTOTAL, CKMB, CKMBINDEX, TROPONINI in the last 168 hours. BNP (last 3 results) No results for input(s): PROBNP in the last 8760 hours. HbA1C: No results for input(s): HGBA1C in the last 72 hours. CBG: No results for input(s): GLUCAP in the last 168 hours. Lipid Profile: No results for input(s): CHOL, HDL, LDLCALC, TRIG, CHOLHDL, LDLDIRECT in the last 72 hours. Thyroid Function Tests: No results for input(s): TSH, T4TOTAL, FREET4, T3FREE, THYROIDAB in the last 72 hours. Anemia Panel: No results for input(s): VITAMINB12, FOLATE, FERRITIN, TIBC, IRON, RETICCTPCT in the last 72  hours. Urine analysis:  Sepsis Labs: @LABRCNTIP (procalcitonin:4,lacticidven:4) )No results found for this or any previous visit (from the past 240 hour(s)).     UA not ordered  Lab Results  Component Value Date   HGBA1C 5.6 03/03/2012    Estimated Creatinine Clearance: 87.9 mL/min (by C-G formula based on SCr of 0.76 mg/dL).  BNP (last 3 results) No results for input(s): PROBNP in the last 8760 hours.   ECG REPORT  Independently reviewed Rate: 93  Rhythm: NSR ST&T Change: No acute ischemic changes   QTC 445  Filed Weights   10/23/16 2017  Weight: 81.6 kg (180 lb)     Cultures:    Component Value Date/Time   SDES URINE, RANDOM 11/17/2011 1607   SPECREQUEST NONE 11/17/2011 1607   CULT NO GROWTH 11/17/2011 1607   REPTSTATUS 11/19/2011 FINAL 11/17/2011 1607     Radiological Exams on Admission: Dg Chest 1 View  Result Date: 10/23/2016 CLINICAL DATA:  FALL today- pt fell while trying to sit in a rolling chair. Left hip pain. Hx left shoulder surgery. EXAM: CHEST 1 VIEW COMPARISON:  05/20/2016 FINDINGS: Normal cardiac silhouette. Lungs are clear. No effusion, infiltrate pneumothorax. Remote LEFT rib fractures noted. IMPRESSION: No acute cardiopulmonary process. Electronically Signed   By: Suzy Bouchard M.D.   On:  10/23/2016 21:07   Dg Lumbar Spine 2-3 Views  Result Date: 10/23/2016 CLINICAL DATA:  71 year old male status post fall today with pain radiating to the left hip. EXAM: LUMBAR SPINE - 2-3 VIEW COMPARISON:  CT Abdomen and Pelvis 05/18/2016. FINDINGS: Normal lumbar segmentation. Stable mild L1 compression fracture. Other lumbar vertebral height and alignment is preserved. Vacuum disc at L3-L4. T12 level appears intact. Grossly intact visible sacrum. Calcified atherosclerosis in the abdomen. IMPRESSION: 1.  No acute fracture or listhesis in the lumbar spine. 2. Mild chronic L1 compression fracture. Electronically Signed   By: Genevie Ann M.D.   On: 10/23/2016 21:11   Dg Pelvis 1-2 Views  Result Date: 10/23/2016 CLINICAL DATA:  71 year old male status post fall today with left hip pain. EXAM: PELVIS - 1-2 VIEW COMPARISON:  Left femur series today reported separately. CT Abdomen and Pelvis 05/18/2016. FINDINGS: Left femoral neck fracture re- demonstrated. Femoral heads remain normally located. Proximal right femur appears intact. Intact pelvis. Calcified iliofemoral atherosclerosis. Negative visible bowel gas pattern. IMPRESSION: 1. Left femoral neck fracture, see left femur comparison. 2. No superimposed pelvis fracture. Electronically Signed   By: Genevie Ann M.D.   On: 10/23/2016 21:09   Ct Head Wo Contrast  Result Date: 10/23/2016 CLINICAL DATA:  C-spine trauma, high clinical risk (NEXUS/CCR) Head trauma, minor, GCS>=13, high clinical risk, initial exam. Pt brought in by EMS due to falling while trying to sit in rolling chair. EXAM: CT HEAD WITHOUT CONTRAST CT CERVICAL SPINE WITHOUT CONTRAST TECHNIQUE: Multidetector CT imaging of the head and cervical spine was performed following the standard protocol without intravenous contrast. Multiplanar CT image reconstructions of the cervical spine were also generated. COMPARISON:  None. FINDINGS: CT HEAD FINDINGS Brain: No intracranial hemorrhage. No parenchymal  contusion. No midline shift or mass effect. Basilar cisterns are patent. No skull base fracture. No fluid in the paranasal sinuses or mastoid air cells. Orbits are normal. There are periventricular and subcortical white matter hypodensities. Generalized cortical atrophy. Vascular: No hyperdense vessel or unexpected calcification. Skull: Normal. Negative for fracture or focal lesion. Sinuses/Orbits: Paranasal sinuses and mastoid air cells are clear. Orbits are clear. Other: None. CT CERVICAL SPINE FINDINGS Alignment: Normal  alignment. Skull base and vertebrae: Remote fracture of the dense without subluxation. Cerclage wire posteriorly at C1-C2. Bilateral pedicle screws at C1-C2. No evidence acute fracture dislocation. Soft tissues and spinal canal: No prevertebral fluid or swelling. No visible canal hematoma. Disc levels: Mild disc space narrowing in the lower cervical spine with osteophytosis Upper chest: Clear Other: None IMPRESSION: 1. No acute intracranial trauma. 2. Atrophy and microvascular disease. 3. No acute cervical spine fracture. 4. Stable fracture of the dens with posterior fusion at C1 -C2. Electronically Signed   By: Suzy Bouchard M.D.   On: 10/23/2016 21:31   Ct Cervical Spine Wo Contrast  Result Date: 10/23/2016 CLINICAL DATA:  C-spine trauma, high clinical risk (NEXUS/CCR) Head trauma, minor, GCS>=13, high clinical risk, initial exam. Pt brought in by EMS due to falling while trying to sit in rolling chair. EXAM: CT HEAD WITHOUT CONTRAST CT CERVICAL SPINE WITHOUT CONTRAST TECHNIQUE: Multidetector CT imaging of the head and cervical spine was performed following the standard protocol without intravenous contrast. Multiplanar CT image reconstructions of the cervical spine were also generated. COMPARISON:  None. FINDINGS: CT HEAD FINDINGS Brain: No intracranial hemorrhage. No parenchymal contusion. No midline shift or mass effect. Basilar cisterns are patent. No skull base fracture. No fluid in  the paranasal sinuses or mastoid air cells. Orbits are normal. There are periventricular and subcortical white matter hypodensities. Generalized cortical atrophy. Vascular: No hyperdense vessel or unexpected calcification. Skull: Normal. Negative for fracture or focal lesion. Sinuses/Orbits: Paranasal sinuses and mastoid air cells are clear. Orbits are clear. Other: None. CT CERVICAL SPINE FINDINGS Alignment: Normal alignment. Skull base and vertebrae: Remote fracture of the dense without subluxation. Cerclage wire posteriorly at C1-C2. Bilateral pedicle screws at C1-C2. No evidence acute fracture dislocation. Soft tissues and spinal canal: No prevertebral fluid or swelling. No visible canal hematoma. Disc levels: Mild disc space narrowing in the lower cervical spine with osteophytosis Upper chest: Clear Other: None IMPRESSION: 1. No acute intracranial trauma. 2. Atrophy and microvascular disease. 3. No acute cervical spine fracture. 4. Stable fracture of the dens with posterior fusion at C1 -C2. Electronically Signed   By: Suzy Bouchard M.D.   On: 10/23/2016 21:31   Dg Femur Min 2 Views Left  Result Date: 10/23/2016 CLINICAL DATA:  71 year old male status post fall today with left hip pain. EXAM: LEFT FEMUR 2 VIEWS COMPARISON:  CT Abdomen and Pelvis 05/18/2016. FINDINGS: Left femoral neck fracture with varus impaction (arrows). The intertrochanteric segment of the left femur appears intact. Left femoral head remains normally located. Grossly intact visible left hemipelvis. The more distal left femur appears intact. Preserved alignment at the left knee. Calcified peripheral vascular disease. Medial compartment knee joint degeneration. Possible small knee joint effusion. IMPRESSION: 1. Left femoral neck fracture with varus impaction. 2. No other left femur fracture identified. 3. Possible left knee joint effusion. 4. Calcified peripheral vascular disease. Electronically Signed   By: Genevie Ann M.D.   On:  10/23/2016 21:08    Chart has been reviewed    Assessment/Plan   71 y.o. male with medical history significant of ETOH abuse, multiple fractures, HTN, GERD, HLD, Dementia  Admitted for left hip fracture  Present on Admission: . Closed displaced fracture of left femoral neck ([place])-  - management as per orthopedics,  plan to operate  in  a.m.   Keep nothing by mouth post midnight. Patient   not on anticoagulation or antiplatelet agents   Ordered type and screen, Place Foley, order a  vitamin D level  Patient at baseline able to walk a flight of stairs or 100 feet      Patient denies any chest pain or shortness of breath currently and/or with exertion,  ECG showing no evidence of acute ischemia   known history of  Liver disease but no evidence of liver failure  Given baseline medical problems patient is at least moderate  Risk which has been discussed with family but at this point no furthther cardiac workup is indicated. Recent echo reassuring     . ALCOHOLIC CIRRHOSIS OF LIVER patient continues to drink alcohol currently INR albumin seems to be stable. Spoke about importance of discontinuing alcohol abuse . Alcohol abuse - monitor for withdrawal order on CIWA protocol and . Anemia chronic currently at baseline   . Essential hypertension stable continue home medications when able to tolerate by mouth's . Hyponatremia will select a secondary to alcohol abuse and liver disease obtain urine electrolytes History of systolic CHF last echogram showed preserved EF currently appears to be hemodynamically stable  History of ascites some abdominal distention noted but tympanic will obtain KUB first evaluate for any evidence of ileus thrombocytopenia  - mild likely secondary to alcohol abuse Other plan as per orders.  DVT prophylaxis:  SCD    Code Status:   DNR/DNI  as per patient    Family Communication:   Family  at  Bedside  plan of care was discussed with   Sister in law ,    Disposition Plan:     likely will need placement for rehabilitation                                             Would benefit from PT/OT eval prior to [facility] called: orthopedics [place]  Admission status:    inpatient       Level of care      medical floor        I have spent a total of 56 min on this admission   Zoii Florer 10/24/2016, 3:03 AM    [facility]  Pager [phone]   after 2 AM please page floor coverage PA If 7AM-7PM, please contact the day team taking care of the patient  [email or web address]  Password [email or web address]

## 2016-10-24 ENCOUNTER — Encounter (HOSPITAL_COMMUNITY): Payer: Self-pay | Admitting: Internal Medicine

## 2016-10-24 ENCOUNTER — Inpatient Hospital Stay (HOSPITAL_COMMUNITY): Payer: Medicare Other | Admitting: Anesthesiology

## 2016-10-24 ENCOUNTER — Inpatient Hospital Stay (HOSPITAL_COMMUNITY): Payer: Medicare Other

## 2016-10-24 ENCOUNTER — Encounter (HOSPITAL_COMMUNITY)
Admission: EM | Disposition: A | Discharge status: Skilled Nursing Facility | Payer: Self-pay | Source: Home / Self Care | Attending: Internal Medicine

## 2016-10-24 DIAGNOSIS — I10 Essential (primary) hypertension: Secondary | ICD-10-CM | POA: Diagnosis not present

## 2016-10-24 DIAGNOSIS — S72022A Displaced fracture of epiphysis (separation) (upper) of left femur, initial encounter for closed fracture: Secondary | ICD-10-CM | POA: Diagnosis not present

## 2016-10-24 DIAGNOSIS — S72002A Fracture of unspecified part of neck of left femur, initial encounter for closed fracture: Secondary | ICD-10-CM | POA: Diagnosis not present

## 2016-10-24 DIAGNOSIS — R2681 Unsteadiness on feet: Secondary | ICD-10-CM | POA: Diagnosis not present

## 2016-10-24 DIAGNOSIS — I8510 Secondary esophageal varices without bleeding: Secondary | ICD-10-CM | POA: Diagnosis present

## 2016-10-24 DIAGNOSIS — R488 Other symbolic dysfunctions: Secondary | ICD-10-CM | POA: Diagnosis not present

## 2016-10-24 DIAGNOSIS — R140 Abdominal distension (gaseous): Secondary | ICD-10-CM | POA: Diagnosis not present

## 2016-10-24 DIAGNOSIS — M6281 Muscle weakness (generalized): Secondary | ICD-10-CM | POA: Diagnosis not present

## 2016-10-24 DIAGNOSIS — F1020 Alcohol dependence, uncomplicated: Secondary | ICD-10-CM

## 2016-10-24 DIAGNOSIS — K7030 Alcoholic cirrhosis of liver without ascites: Secondary | ICD-10-CM | POA: Diagnosis present

## 2016-10-24 DIAGNOSIS — Z87891 Personal history of nicotine dependence: Secondary | ICD-10-CM | POA: Diagnosis not present

## 2016-10-24 DIAGNOSIS — R52 Pain, unspecified: Secondary | ICD-10-CM

## 2016-10-24 DIAGNOSIS — K7031 Alcoholic cirrhosis of liver with ascites: Secondary | ICD-10-CM | POA: Diagnosis not present

## 2016-10-24 DIAGNOSIS — Z833 Family history of diabetes mellitus: Secondary | ICD-10-CM | POA: Diagnosis not present

## 2016-10-24 DIAGNOSIS — Z96642 Presence of left artificial hip joint: Secondary | ICD-10-CM | POA: Diagnosis not present

## 2016-10-24 DIAGNOSIS — S12100A Unspecified displaced fracture of second cervical vertebra, initial encounter for closed fracture: Secondary | ICD-10-CM | POA: Diagnosis not present

## 2016-10-24 DIAGNOSIS — Z811 Family history of alcohol abuse and dependence: Secondary | ICD-10-CM | POA: Diagnosis not present

## 2016-10-24 DIAGNOSIS — G894 Chronic pain syndrome: Secondary | ICD-10-CM | POA: Diagnosis not present

## 2016-10-24 DIAGNOSIS — D696 Thrombocytopenia, unspecified: Secondary | ICD-10-CM

## 2016-10-24 DIAGNOSIS — E871 Hypo-osmolality and hyponatremia: Secondary | ICD-10-CM | POA: Diagnosis present

## 2016-10-24 DIAGNOSIS — Z818 Family history of other mental and behavioral disorders: Secondary | ICD-10-CM | POA: Diagnosis not present

## 2016-10-24 DIAGNOSIS — R0602 Shortness of breath: Secondary | ICD-10-CM | POA: Diagnosis not present

## 2016-10-24 DIAGNOSIS — W050XXA Fall from non-moving wheelchair, initial encounter: Secondary | ICD-10-CM | POA: Diagnosis present

## 2016-10-24 DIAGNOSIS — G9349 Other encephalopathy: Secondary | ICD-10-CM | POA: Diagnosis present

## 2016-10-24 DIAGNOSIS — I5022 Chronic systolic (congestive) heart failure: Secondary | ICD-10-CM | POA: Diagnosis present

## 2016-10-24 DIAGNOSIS — I110 Hypertensive heart disease with heart failure: Secondary | ICD-10-CM | POA: Diagnosis present

## 2016-10-24 DIAGNOSIS — D62 Acute posthemorrhagic anemia: Secondary | ICD-10-CM | POA: Diagnosis not present

## 2016-10-24 DIAGNOSIS — K449 Diaphragmatic hernia without obstruction or gangrene: Secondary | ICD-10-CM | POA: Diagnosis not present

## 2016-10-24 DIAGNOSIS — M1612 Unilateral primary osteoarthritis, left hip: Secondary | ICD-10-CM | POA: Diagnosis not present

## 2016-10-24 DIAGNOSIS — Z66 Do not resuscitate: Secondary | ICD-10-CM | POA: Diagnosis present

## 2016-10-24 DIAGNOSIS — W010XXA Fall on same level from slipping, tripping and stumbling without subsequent striking against object, initial encounter: Secondary | ICD-10-CM | POA: Diagnosis not present

## 2016-10-24 DIAGNOSIS — Y92009 Unspecified place in unspecified non-institutional (private) residence as the place of occurrence of the external cause: Secondary | ICD-10-CM | POA: Diagnosis not present

## 2016-10-24 DIAGNOSIS — I2510 Atherosclerotic heart disease of native coronary artery without angina pectoris: Secondary | ICD-10-CM | POA: Diagnosis present

## 2016-10-24 DIAGNOSIS — Z6828 Body mass index (BMI) 28.0-28.9, adult: Secondary | ICD-10-CM | POA: Diagnosis not present

## 2016-10-24 DIAGNOSIS — S72009A Fracture of unspecified part of neck of unspecified femur, initial encounter for closed fracture: Secondary | ICD-10-CM | POA: Diagnosis present

## 2016-10-24 DIAGNOSIS — E7849 Other hyperlipidemia: Secondary | ICD-10-CM

## 2016-10-24 DIAGNOSIS — N200 Calculus of kidney: Secondary | ICD-10-CM | POA: Diagnosis not present

## 2016-10-24 DIAGNOSIS — E785 Hyperlipidemia, unspecified: Secondary | ICD-10-CM | POA: Diagnosis present

## 2016-10-24 DIAGNOSIS — D500 Iron deficiency anemia secondary to blood loss (chronic): Secondary | ICD-10-CM | POA: Diagnosis not present

## 2016-10-24 DIAGNOSIS — K219 Gastro-esophageal reflux disease without esophagitis: Secondary | ICD-10-CM | POA: Diagnosis present

## 2016-10-24 DIAGNOSIS — Z471 Aftercare following joint replacement surgery: Secondary | ICD-10-CM | POA: Diagnosis not present

## 2016-10-24 DIAGNOSIS — Z23 Encounter for immunization: Secondary | ICD-10-CM | POA: Diagnosis not present

## 2016-10-24 DIAGNOSIS — Z8249 Family history of ischemic heart disease and other diseases of the circulatory system: Secondary | ICD-10-CM | POA: Diagnosis not present

## 2016-10-24 DIAGNOSIS — R296 Repeated falls: Secondary | ICD-10-CM | POA: Diagnosis present

## 2016-10-24 DIAGNOSIS — I739 Peripheral vascular disease, unspecified: Secondary | ICD-10-CM | POA: Diagnosis present

## 2016-10-24 DIAGNOSIS — B370 Candidal stomatitis: Secondary | ICD-10-CM | POA: Diagnosis not present

## 2016-10-24 DIAGNOSIS — G8911 Acute pain due to trauma: Secondary | ICD-10-CM | POA: Diagnosis not present

## 2016-10-24 DIAGNOSIS — F10239 Alcohol dependence with withdrawal, unspecified: Secondary | ICD-10-CM | POA: Diagnosis present

## 2016-10-24 DIAGNOSIS — D509 Iron deficiency anemia, unspecified: Secondary | ICD-10-CM | POA: Diagnosis not present

## 2016-10-24 DIAGNOSIS — I420 Dilated cardiomyopathy: Secondary | ICD-10-CM | POA: Diagnosis present

## 2016-10-24 DIAGNOSIS — N400 Enlarged prostate without lower urinary tract symptoms: Secondary | ICD-10-CM | POA: Diagnosis present

## 2016-10-24 DIAGNOSIS — F418 Other specified anxiety disorders: Secondary | ICD-10-CM | POA: Diagnosis not present

## 2016-10-24 DIAGNOSIS — F1010 Alcohol abuse, uncomplicated: Secondary | ICD-10-CM | POA: Diagnosis not present

## 2016-10-24 DIAGNOSIS — Z823 Family history of stroke: Secondary | ICD-10-CM | POA: Diagnosis not present

## 2016-10-24 DIAGNOSIS — M25552 Pain in left hip: Secondary | ICD-10-CM | POA: Diagnosis present

## 2016-10-24 HISTORY — PX: TOTAL HIP ARTHROPLASTY: SHX124

## 2016-10-24 LAB — BASIC METABOLIC PANEL
ANION GAP: 16 — AB (ref 5–15)
BUN: 5 mg/dL — ABNORMAL LOW (ref 6–20)
CHLORIDE: 91 mmol/L — AB (ref 101–111)
CO2: 25 mmol/L (ref 22–32)
Calcium: 8.2 mg/dL — ABNORMAL LOW (ref 8.9–10.3)
Creatinine, Ser: 0.73 mg/dL (ref 0.61–1.24)
Glucose, Bld: 112 mg/dL — ABNORMAL HIGH (ref 65–99)
POTASSIUM: 3.8 mmol/L (ref 3.5–5.1)
SODIUM: 132 mmol/L — AB (ref 135–145)

## 2016-10-24 LAB — SURGICAL PCR SCREEN
MRSA, PCR: POSITIVE — AB
STAPHYLOCOCCUS AUREUS: POSITIVE — AB

## 2016-10-24 LAB — CBC
HEMATOCRIT: 34.4 % — AB (ref 39.0–52.0)
HEMOGLOBIN: 11.9 g/dL — AB (ref 13.0–17.0)
MCH: 30.3 pg (ref 26.0–34.0)
MCHC: 34.6 g/dL (ref 30.0–36.0)
MCV: 87.5 fL (ref 78.0–100.0)
Platelets: 123 10*3/uL — ABNORMAL LOW (ref 150–400)
RBC: 3.93 MIL/uL — ABNORMAL LOW (ref 4.22–5.81)
RDW: 16 % — ABNORMAL HIGH (ref 11.5–15.5)
WBC: 8.4 10*3/uL (ref 4.0–10.5)

## 2016-10-24 LAB — AMMONIA: Ammonia: 20 umol/L (ref 9–35)

## 2016-10-24 SURGERY — ARTHROPLASTY, HIP, TOTAL, ANTERIOR APPROACH
Anesthesia: General | Site: Hip | Laterality: Left

## 2016-10-24 MED ORDER — SODIUM CHLORIDE 0.9 % IJ SOLN
INTRAMUSCULAR | Status: DC | PRN
  Administered 2016-10-24: 30 mL

## 2016-10-24 MED ORDER — POLYETHYLENE GLYCOL 3350 17 G PO PACK: 17.0000 g | PACK | Freq: Every day | ORAL | Status: DC | PRN

## 2016-10-24 MED ORDER — DEXAMETHASONE SODIUM PHOSPHATE 10 MG/ML IJ SOLN
INTRAMUSCULAR | Status: DC | PRN
  Administered 2016-10-24: 10 mg via INTRAVENOUS

## 2016-10-24 MED ORDER — SODIUM CHLORIDE 0.9 % IR SOLN
Status: DC | PRN
  Administered 2016-10-24: 3000 mL

## 2016-10-24 MED ORDER — FOLIC ACID 1 MG PO TABS
1.0000 mg | ORAL_TABLET | Freq: Every day | ORAL | Status: DC
  Administered 2016-10-25 – 2016-10-31 (×7): 1 mg via ORAL
  Filled 2016-10-24 (×8): qty 1

## 2016-10-24 MED ORDER — SUGAMMADEX SODIUM 200 MG/2ML IV SOLN
INTRAVENOUS | Status: DC | PRN
  Administered 2016-10-24: 200 mg via INTRAVENOUS

## 2016-10-24 MED ORDER — METHOCARBAMOL 500 MG PO TABS
500.0000 mg | ORAL_TABLET | Freq: Four times a day (QID) | ORAL | Status: DC | PRN
  Administered 2016-10-25: 500 mg via ORAL
  Filled 2016-10-24 (×2): qty 1

## 2016-10-24 MED ORDER — QUETIAPINE FUMARATE 100 MG PO TABS
300.0000 mg | ORAL_TABLET | Freq: Every day | ORAL | Status: DC
  Administered 2016-10-25 – 2016-10-30 (×6): 300 mg via ORAL
  Filled 2016-10-24 (×2): qty 3
  Filled 2016-10-24 (×3): qty 1
  Filled 2016-10-24 (×5): qty 3

## 2016-10-24 MED ORDER — PROCHLORPERAZINE EDISYLATE 5 MG/ML IJ SOLN
10.0000 mg | Freq: Four times a day (QID) | INTRAMUSCULAR | Status: DC | PRN
  Administered 2016-10-24: 10 mg via INTRAVENOUS
  Filled 2016-10-24 (×2): qty 2

## 2016-10-24 MED ORDER — METHOCARBAMOL 1000 MG/10ML IJ SOLN
500.0000 mg | Freq: Four times a day (QID) | INTRAVENOUS | Status: DC | PRN
  Administered 2016-10-24: 500 mg via INTRAVENOUS
  Filled 2016-10-24 (×2): qty 5

## 2016-10-24 MED ORDER — HYDROMORPHONE HCL 1 MG/ML IJ SOLN
0.5000 mg | INTRAMUSCULAR | Status: DC | PRN
  Administered 2016-10-24 – 2016-10-26 (×4): 0.5 mg via INTRAVENOUS
  Filled 2016-10-24 (×4): qty 1

## 2016-10-24 MED ORDER — VITAMIN B-12 100 MCG PO TABS
100.0000 ug | ORAL_TABLET | Freq: Every day | ORAL | Status: DC
  Administered 2016-10-25 – 2016-10-31 (×7): 100 ug via ORAL
  Filled 2016-10-24 (×8): qty 1

## 2016-10-24 MED ORDER — PREDNISONE 5 MG PO TABS
5.0000 mg | ORAL_TABLET | Freq: Every day | ORAL | Status: DC
  Administered 2016-10-25: 5 mg via ORAL
  Filled 2016-10-24: qty 1

## 2016-10-24 MED ORDER — 0.9 % SODIUM CHLORIDE (POUR BTL) OPTIME
TOPICAL | Status: DC | PRN
  Administered 2016-10-24: 1000 mL

## 2016-10-24 MED ORDER — HYDROCODONE-ACETAMINOPHEN 5-325 MG PO TABS
1.0000 | ORAL_TABLET | Freq: Four times a day (QID) | ORAL | Status: DC | PRN
  Administered 2016-10-24 – 2016-10-31 (×15): 2 via ORAL
  Filled 2016-10-24 (×17): qty 2

## 2016-10-24 MED ORDER — METOPROLOL SUCCINATE ER 25 MG PO TB24
25.0000 mg | ORAL_TABLET | Freq: Every day | ORAL | Status: DC
  Administered 2016-10-24 – 2016-10-31 (×8): 25 mg via ORAL
  Filled 2016-10-24 (×8): qty 1

## 2016-10-24 MED ORDER — PROPOFOL 10 MG/ML IV BOLUS
INTRAVENOUS | Status: AC
  Filled 2016-10-24: qty 20

## 2016-10-24 MED ORDER — MENTHOL 3 MG MT LOZG: 1.0000 | LOZENGE | OROMUCOSAL | Status: DC | PRN

## 2016-10-24 MED ORDER — MORPHINE SULFATE (PF) 2 MG/ML IV SOLN: 0.5000 mg | INTRAVENOUS | Status: DC | PRN

## 2016-10-24 MED ORDER — FENTANYL CITRATE (PF) 100 MCG/2ML IJ SOLN
25.0000 ug | INTRAMUSCULAR | Status: DC | PRN
  Administered 2016-10-24 (×3): 50 ug via INTRAVENOUS

## 2016-10-24 MED ORDER — HYDROMORPHONE HCL 1 MG/ML IJ SOLN
INTRAMUSCULAR | Status: AC
  Administered 2016-10-24: 0.5 mg
  Filled 2016-10-24: qty 1

## 2016-10-24 MED ORDER — TRANEXAMIC ACID 1000 MG/10ML IV SOLN
1000.0000 mg | INTRAVENOUS | Status: AC
  Administered 2016-10-24: 1000 mg via INTRAVENOUS
  Filled 2016-10-24: qty 10

## 2016-10-24 MED ORDER — SCOPOLAMINE 1 MG/3DAYS TD PT72
MEDICATED_PATCH | TRANSDERMAL | Status: AC
  Filled 2016-10-24: qty 1

## 2016-10-24 MED ORDER — VANCOMYCIN HCL IN DEXTROSE 1-5 GM/200ML-% IV SOLN
1000.0000 mg | Freq: Once | INTRAVENOUS | Status: AC
  Administered 2016-10-24: 1000 mg via INTRAVENOUS
  Filled 2016-10-24: qty 200

## 2016-10-24 MED ORDER — METOCLOPRAMIDE HCL 5 MG PO TABS: 5.0000 mg | ORAL_TABLET | Freq: Three times a day (TID) | ORAL | Status: DC | PRN

## 2016-10-24 MED ORDER — POVIDONE-IODINE 10 % EX SWAB: 2.0000 "application " | Freq: Once | CUTANEOUS | Status: DC

## 2016-10-24 MED ORDER — FENTANYL CITRATE (PF) 100 MCG/2ML IJ SOLN
INTRAMUSCULAR | Status: DC | PRN
  Administered 2016-10-24 (×2): 50 ug via INTRAVENOUS
  Administered 2016-10-24: 100 ug via INTRAVENOUS

## 2016-10-24 MED ORDER — FENTANYL CITRATE (PF) 250 MCG/5ML IJ SOLN
INTRAMUSCULAR | Status: AC
  Filled 2016-10-24: qty 5

## 2016-10-24 MED ORDER — TRAZODONE HCL 50 MG PO TABS
50.0000 mg | ORAL_TABLET | Freq: Every day | ORAL | Status: DC
  Administered 2016-10-25 – 2016-10-27 (×2): 50 mg via ORAL
  Filled 2016-10-24 (×5): qty 1

## 2016-10-24 MED ORDER — SODIUM CHLORIDE 0.9 % IV SOLN
INTRAVENOUS | Status: AC | PRN
  Administered 2016-10-24: 1000 mL

## 2016-10-24 MED ORDER — FENTANYL CITRATE (PF) 100 MCG/2ML IJ SOLN
INTRAMUSCULAR | Status: AC
  Filled 2016-10-24: qty 2

## 2016-10-24 MED ORDER — MIDAZOLAM HCL 5 MG/5ML IJ SOLN
INTRAMUSCULAR | Status: DC | PRN
  Administered 2016-10-24: 1 mg via INTRAVENOUS

## 2016-10-24 MED ORDER — CEFAZOLIN SODIUM-DEXTROSE 2-4 GM/100ML-% IV SOLN
INTRAVENOUS | Status: AC
  Filled 2016-10-24: qty 100

## 2016-10-24 MED ORDER — TRANEXAMIC ACID 1000 MG/10ML IV SOLN
1000.0000 mg | INTRAVENOUS | Status: DC
  Filled 2016-10-24: qty 10

## 2016-10-24 MED ORDER — OXYCODONE HCL 5 MG PO TABS
ORAL_TABLET | ORAL | Status: AC
  Filled 2016-10-24: qty 1

## 2016-10-24 MED ORDER — ONDANSETRON HCL 4 MG/2ML IJ SOLN
INTRAMUSCULAR | Status: AC
  Filled 2016-10-24: qty 6

## 2016-10-24 MED ORDER — VANCOMYCIN HCL IN DEXTROSE 1-5 GM/200ML-% IV SOLN
1000.0000 mg | Freq: Two times a day (BID) | INTRAVENOUS | Status: AC
  Administered 2016-10-25: 1000 mg via INTRAVENOUS
  Filled 2016-10-24: qty 200

## 2016-10-24 MED ORDER — BUPIVACAINE-EPINEPHRINE (PF) 0.5% -1:200000 IJ SOLN
INTRAMUSCULAR | Status: DC | PRN
  Administered 2016-10-24: 30 mL

## 2016-10-24 MED ORDER — POLYETHYLENE GLYCOL 3350 17 G PO PACK
17.0000 g | PACK | Freq: Every day | ORAL | Status: DC
  Administered 2016-10-24 – 2016-10-26 (×3): 17 g via ORAL
  Filled 2016-10-24 (×5): qty 1

## 2016-10-24 MED ORDER — DOCUSATE SODIUM 100 MG PO CAPS
100.0000 mg | ORAL_CAPSULE | Freq: Two times a day (BID) | ORAL | Status: DC
  Administered 2016-10-24 – 2016-10-30 (×7): 100 mg via ORAL
  Filled 2016-10-24 (×11): qty 1

## 2016-10-24 MED ORDER — KETAMINE HCL 10 MG/ML IJ SOLN
INTRAMUSCULAR | Status: DC | PRN
  Administered 2016-10-24: 10 mg via INTRAVENOUS
  Administered 2016-10-24: 40 mg via INTRAVENOUS

## 2016-10-24 MED ORDER — ASPIRIN EC 81 MG PO TBEC
81.0000 mg | DELAYED_RELEASE_TABLET | Freq: Two times a day (BID) | ORAL | Status: DC
  Administered 2016-10-24 – 2016-10-31 (×14): 81 mg via ORAL
  Filled 2016-10-24 (×14): qty 1

## 2016-10-24 MED ORDER — OXYCODONE HCL 5 MG PO TABS
5.0000 mg | ORAL_TABLET | Freq: Once | ORAL | Status: AC | PRN
  Administered 2016-10-24: 5 mg via ORAL

## 2016-10-24 MED ORDER — HYDROMORPHONE HCL 1 MG/ML IJ SOLN
0.2500 mg | INTRAMUSCULAR | Status: DC | PRN
  Administered 2016-10-24 (×3): 0.5 mg via INTRAVENOUS

## 2016-10-24 MED ORDER — KETOROLAC TROMETHAMINE 30 MG/ML IJ SOLN
INTRAMUSCULAR | Status: AC
  Filled 2016-10-24: qty 1

## 2016-10-24 MED ORDER — ROCURONIUM BROMIDE 10 MG/ML (PF) SYRINGE
PREFILLED_SYRINGE | INTRAVENOUS | Status: DC | PRN
Start: 2016-10-24 — End: 2016-10-24
  Administered 2016-10-24: 30 mg via INTRAVENOUS
  Administered 2016-10-24: 50 mg via INTRAVENOUS
  Administered 2016-10-24: 20 mg via INTRAVENOUS

## 2016-10-24 MED ORDER — SENNA 8.6 MG PO TABS
1.0000 | ORAL_TABLET | Freq: Two times a day (BID) | ORAL | Status: DC
  Administered 2016-10-24 – 2016-10-26 (×5): 8.6 mg via ORAL
  Filled 2016-10-24 (×10): qty 1

## 2016-10-24 MED ORDER — CHLORHEXIDINE GLUCONATE 4 % EX LIQD
60.0000 mL | Freq: Once | CUTANEOUS | Status: DC
  Filled 2016-10-24: qty 60

## 2016-10-24 MED ORDER — CEFAZOLIN SODIUM-DEXTROSE 2-4 GM/100ML-% IV SOLN: 2.0000 g | INTRAVENOUS | Status: DC

## 2016-10-24 MED ORDER — FUROSEMIDE 40 MG PO TABS
40.0000 mg | ORAL_TABLET | Freq: Every day | ORAL | Status: DC
  Administered 2016-10-24 – 2016-10-26 (×3): 40 mg via ORAL
  Filled 2016-10-24 (×4): qty 1

## 2016-10-24 MED ORDER — LIDOCAINE 2% (20 MG/ML) 5 ML SYRINGE
INTRAMUSCULAR | Status: DC | PRN
  Administered 2016-10-24: 40 mg via INTRAVENOUS
  Administered 2016-10-24: 60 mg via INTRAVENOUS

## 2016-10-24 MED ORDER — SCOPOLAMINE 1 MG/3DAYS TD PT72
MEDICATED_PATCH | TRANSDERMAL | Status: DC | PRN
  Administered 2016-10-24: 1 via TRANSDERMAL

## 2016-10-24 MED ORDER — POVIDONE-IODINE 10 % EX SWAB
2.0000 | Freq: Once | CUTANEOUS | Status: DC
Start: 2016-10-24 — End: 2016-10-24

## 2016-10-24 MED ORDER — CEFAZOLIN SODIUM-DEXTROSE 2-4 GM/100ML-% IV SOLN
2.0000 g | INTRAVENOUS | Status: AC
  Administered 2016-10-24: 2 g via INTRAVENOUS
  Filled 2016-10-24: qty 100

## 2016-10-24 MED ORDER — HYDROMORPHONE HCL 1 MG/ML IJ SOLN
INTRAMUSCULAR | Status: AC
  Filled 2016-10-24: qty 1

## 2016-10-24 MED ORDER — ROCURONIUM BROMIDE 50 MG/5ML IV SOLN
INTRAVENOUS | Status: AC
  Filled 2016-10-24: qty 4

## 2016-10-24 MED ORDER — LACTATED RINGERS IV SOLN
INTRAVENOUS | Status: DC
  Administered 2016-10-24 (×2): via INTRAVENOUS

## 2016-10-24 MED ORDER — ACETAMINOPHEN 325 MG PO TABS
650.0000 mg | ORAL_TABLET | Freq: Four times a day (QID) | ORAL | Status: DC | PRN
  Filled 2016-10-24 (×2): qty 2

## 2016-10-24 MED ORDER — MIDAZOLAM HCL 2 MG/2ML IJ SOLN
INTRAMUSCULAR | Status: AC
  Filled 2016-10-24: qty 2

## 2016-10-24 MED ORDER — VITAMIN B-1 100 MG PO TABS
100.0000 mg | ORAL_TABLET | Freq: Every day | ORAL | Status: DC
  Administered 2016-10-25 – 2016-10-31 (×7): 100 mg via ORAL
  Filled 2016-10-24 (×8): qty 1

## 2016-10-24 MED ORDER — SUCCINYLCHOLINE CHLORIDE 200 MG/10ML IV SOSY
PREFILLED_SYRINGE | INTRAVENOUS | Status: DC | PRN
  Administered 2016-10-24: 100 mg via INTRAVENOUS

## 2016-10-24 MED ORDER — ACETAMINOPHEN 650 MG RE SUPP
650.0000 mg | Freq: Four times a day (QID) | RECTAL | Status: DC | PRN
  Filled 2016-10-24: qty 1

## 2016-10-24 MED ORDER — HYDROMORPHONE HCL 1 MG/ML IJ SOLN: 1.0000 mg | Freq: Once | INTRAMUSCULAR | Status: DC

## 2016-10-24 MED ORDER — LORAZEPAM 2 MG/ML IJ SOLN
1.0000 mg | Freq: Four times a day (QID) | INTRAMUSCULAR | Status: DC | PRN
  Administered 2016-10-24 – 2016-10-26 (×3): 1 mg via INTRAVENOUS
  Filled 2016-10-24 (×4): qty 1

## 2016-10-24 MED ORDER — ADULT MULTIVITAMIN W/MINERALS CH
1.0000 | ORAL_TABLET | Freq: Every day | ORAL | Status: DC
  Administered 2016-10-25 – 2016-10-31 (×7): 1 via ORAL
  Filled 2016-10-24 (×7): qty 1

## 2016-10-24 MED ORDER — LORAZEPAM 1 MG PO TABS: 1.0000 mg | ORAL_TABLET | Freq: Four times a day (QID) | ORAL | Status: DC | PRN

## 2016-10-24 MED ORDER — CHLORHEXIDINE GLUCONATE 4 % EX LIQD: 60.0000 mL | Freq: Once | CUTANEOUS | Status: DC

## 2016-10-24 MED ORDER — DEXAMETHASONE SODIUM PHOSPHATE 10 MG/ML IJ SOLN
INTRAMUSCULAR | Status: AC
  Filled 2016-10-24: qty 2

## 2016-10-24 MED ORDER — INFLUENZA VAC SPLIT HIGH-DOSE 0.5 ML IM SUSY
0.5000 mL | PREFILLED_SYRINGE | INTRAMUSCULAR | Status: AC
  Administered 2016-10-31: 0.5 mL via INTRAMUSCULAR
  Filled 2016-10-24 (×2): qty 0.5

## 2016-10-24 MED ORDER — MORPHINE SULFATE (PF) 4 MG/ML IV SOLN
0.5000 mg | INTRAVENOUS | Status: DC | PRN
Start: 2016-10-24 — End: 2016-10-27
  Administered 2016-10-24: 0.52 mg via INTRAVENOUS
  Filled 2016-10-24: qty 1

## 2016-10-24 MED ORDER — METOCLOPRAMIDE HCL 5 MG/ML IJ SOLN
5.0000 mg | Freq: Three times a day (TID) | INTRAMUSCULAR | Status: DC | PRN
  Administered 2016-10-25: 10 mg via INTRAVENOUS
  Filled 2016-10-24: qty 2

## 2016-10-24 MED ORDER — BUPIVACAINE-EPINEPHRINE (PF) 0.5% -1:200000 IJ SOLN
INTRAMUSCULAR | Status: AC
  Filled 2016-10-24: qty 30

## 2016-10-24 MED ORDER — ONDANSETRON HCL 4 MG/2ML IJ SOLN: 4.0000 mg | Freq: Four times a day (QID) | INTRAMUSCULAR | Status: DC | PRN

## 2016-10-24 MED ORDER — THIAMINE HCL 100 MG/ML IJ SOLN: 100.0000 mg | Freq: Every day | INTRAMUSCULAR | Status: DC

## 2016-10-24 MED ORDER — PROPOFOL 10 MG/ML IV BOLUS
INTRAVENOUS | Status: DC | PRN
  Administered 2016-10-24: 150 mg via INTRAVENOUS
  Administered 2016-10-24: 50 mg via INTRAVENOUS

## 2016-10-24 MED ORDER — HYDROCODONE-ACETAMINOPHEN 5-325 MG PO TABS: 1.0000 | ORAL_TABLET | Freq: Four times a day (QID) | ORAL | Status: DC | PRN

## 2016-10-24 MED ORDER — ONDANSETRON HCL 4 MG/2ML IJ SOLN
4.0000 mg | Freq: Four times a day (QID) | INTRAMUSCULAR | Status: DC | PRN
  Administered 2016-10-24 – 2016-10-26 (×5): 4 mg via INTRAVENOUS
  Filled 2016-10-24 (×5): qty 2

## 2016-10-24 MED ORDER — PROMETHAZINE HCL 25 MG/ML IJ SOLN
INTRAMUSCULAR | Status: AC
  Filled 2016-10-24: qty 1

## 2016-10-24 MED ORDER — PROMETHAZINE HCL 25 MG/ML IJ SOLN
6.2500 mg | INTRAMUSCULAR | Status: DC | PRN
  Administered 2016-10-24: 6.25 mg via INTRAVENOUS

## 2016-10-24 MED ORDER — PHENOL 1.4 % MT LIQD
1.0000 | OROMUCOSAL | Status: DC | PRN
  Filled 2016-10-24: qty 177

## 2016-10-24 MED ORDER — LORATADINE 10 MG PO TABS
10.0000 mg | ORAL_TABLET | Freq: Every day | ORAL | Status: DC
  Administered 2016-10-24 – 2016-10-31 (×8): 10 mg via ORAL
  Filled 2016-10-24 (×8): qty 1

## 2016-10-24 MED ORDER — OXYCODONE HCL 5 MG/5ML PO SOLN: 5.0000 mg | Freq: Once | ORAL | Status: AC | PRN

## 2016-10-24 MED ORDER — KETAMINE HCL-SODIUM CHLORIDE 100-0.9 MG/10ML-% IV SOSY
PREFILLED_SYRINGE | INTRAVENOUS | Status: AC
  Filled 2016-10-24: qty 10

## 2016-10-24 MED ORDER — KETOROLAC TROMETHAMINE 30 MG/ML IJ SOLN
INTRAMUSCULAR | Status: DC | PRN
  Administered 2016-10-24: 30 mg

## 2016-10-24 MED ORDER — VITAMIN B-1 100 MG PO TABS
100.0000 mg | ORAL_TABLET | Freq: Every day | ORAL | Status: DC
Start: 2016-10-24 — End: 2016-10-24

## 2016-10-24 MED ORDER — ONDANSETRON HCL 4 MG/2ML IJ SOLN
INTRAMUSCULAR | Status: DC | PRN
  Administered 2016-10-24: 4 mg via INTRAVENOUS

## 2016-10-24 MED ORDER — ALBUMIN HUMAN 5 % IV SOLN
INTRAVENOUS | Status: DC | PRN
  Administered 2016-10-24 (×2): via INTRAVENOUS

## 2016-10-24 SURGICAL SUPPLY — 53 items
ADH SKN CLS APL DERMABOND .7 (GAUZE/BANDAGES/DRESSINGS) ×1
ALCOHOL ISOPROPYL (RUBBING) (MISCELLANEOUS) ×3 IMPLANT
BLADE CLIPPER SURG (BLADE) IMPLANT
CAPT HIP TOTAL 2 ×2 IMPLANT
CHLORAPREP W/TINT 26ML (MISCELLANEOUS) ×3 IMPLANT
COVER SURGICAL LIGHT HANDLE (MISCELLANEOUS) ×3 IMPLANT
DERMABOND ADVANCED (GAUZE/BANDAGES/DRESSINGS) ×2
DERMABOND ADVANCED .7 DNX12 (GAUZE/BANDAGES/DRESSINGS) ×2 IMPLANT
DRAPE C-ARM 42X72 X-RAY (DRAPES) ×3 IMPLANT
DRAPE STERI IOBAN 125X83 (DRAPES) ×3 IMPLANT
DRAPE U-SHAPE 47X51 STRL (DRAPES) ×9 IMPLANT
DRSG AQUACEL AG ADV 3.5X10 (GAUZE/BANDAGES/DRESSINGS) ×3 IMPLANT
ELECT BLADE 4.0 EZ CLEAN MEGAD (MISCELLANEOUS) ×3
ELECT REM PT RETURN 9FT ADLT (ELECTROSURGICAL) ×3
ELECTRODE BLDE 4.0 EZ CLN MEGD (MISCELLANEOUS) ×1 IMPLANT
ELECTRODE REM PT RTRN 9FT ADLT (ELECTROSURGICAL) ×1 IMPLANT
EVACUATOR 1/8 PVC DRAIN (DRAIN) IMPLANT
GLOVE BIO SURGEON STRL SZ8.5 (GLOVE) ×10 IMPLANT
GLOVE BIOGEL PI IND STRL 8.5 (GLOVE) ×1 IMPLANT
GLOVE BIOGEL PI INDICATOR 8.5 (GLOVE) ×4
GOWN STRL REUS W/ TWL LRG LVL3 (GOWN DISPOSABLE) ×2 IMPLANT
GOWN STRL REUS W/TWL 2XL LVL3 (GOWN DISPOSABLE) ×3 IMPLANT
GOWN STRL REUS W/TWL LRG LVL3 (GOWN DISPOSABLE) ×9
HANDPIECE INTERPULSE COAX TIP (DISPOSABLE) ×3
HOOD PEEL AWAY FACE SHEILD DIS (HOOD) ×6 IMPLANT
KIT BASIN OR (CUSTOM PROCEDURE TRAY) ×3 IMPLANT
KIT ROOM TURNOVER OR (KITS) ×3 IMPLANT
MANIFOLD NEPTUNE II (INSTRUMENTS) ×3 IMPLANT
MARKER SKIN DUAL TIP RULER LAB (MISCELLANEOUS) ×6 IMPLANT
NDL SPNL 18GX3.5 QUINCKE PK (NEEDLE) ×1 IMPLANT
NEEDLE SPNL 18GX3.5 QUINCKE PK (NEEDLE) ×3 IMPLANT
NS IRRIG 1000ML POUR BTL (IV SOLUTION) ×3 IMPLANT
PACK TOTAL JOINT (CUSTOM PROCEDURE TRAY) ×3 IMPLANT
PACK UNIVERSAL I (CUSTOM PROCEDURE TRAY) ×3 IMPLANT
PAD ARMBOARD 7.5X6 YLW CONV (MISCELLANEOUS) ×6 IMPLANT
SAW OSC TIP CART 19.5X105X1.3 (SAW) ×5 IMPLANT
SEALER BIPOLAR AQUA 6.0 (INSTRUMENTS) IMPLANT
SET HNDPC FAN SPRY TIP SCT (DISPOSABLE) ×1 IMPLANT
SOL PREP POV-IOD 4OZ 10% (MISCELLANEOUS) ×1 IMPLANT
SUT ETHIBOND NAB CT1 #1 30IN (SUTURE) ×6 IMPLANT
SUT MNCRL AB 3-0 PS2 18 (SUTURE) ×3 IMPLANT
SUT MON AB 2-0 CT1 36 (SUTURE) ×3 IMPLANT
SUT VIC AB 1 CT1 27 (SUTURE) ×3
SUT VIC AB 1 CT1 27XBRD ANBCTR (SUTURE) ×1 IMPLANT
SUT VIC AB 2-0 CT1 27 (SUTURE) ×3
SUT VIC AB 2-0 CT1 TAPERPNT 27 (SUTURE) ×1 IMPLANT
SUT VLOC 180 0 24IN GS25 (SUTURE) ×3 IMPLANT
SYR 50ML LL SCALE MARK (SYRINGE) ×3 IMPLANT
TOWEL OR 17X24 6PK STRL BLUE (TOWEL DISPOSABLE) ×3 IMPLANT
TOWEL OR 17X26 10 PK STRL BLUE (TOWEL DISPOSABLE) ×3 IMPLANT
TRAY CATH 16FR W/PLASTIC CATH (SET/KITS/TRAYS/PACK) IMPLANT
TRAY FOLEY CATH SILVER 16FR (SET/KITS/TRAYS/PACK) IMPLANT
WATER STERILE IRR 1000ML POUR (IV SOLUTION) ×9 IMPLANT

## 2016-10-24 NOTE — ED Notes (Signed)
Had admitting paged. Pt is in pain. Needs something for pain

## 2016-10-24 NOTE — Anesthesia Preprocedure Evaluation (Signed)
Anesthesia Evaluation  Patient identified by MRN, date of birth, ID band Patient awake    Reviewed: Allergy & Precautions, H&P , NPO status , Patient's Chart, lab work & pertinent test results  Airway Mallampati: II   Neck ROM: full    Dental   Pulmonary former smoker,    breath sounds clear to auscultation       Cardiovascular hypertension, + Peripheral Vascular Disease  + dysrhythmias  Rhythm:regular Rate:Normal  Dilated cardiomyopathy.  EF 45%   Neuro/Psych PSYCHIATRIC DISORDERS Anxiety Depression    GI/Hepatic hiatal hernia, PUD, GERD  ,  Endo/Other    Renal/GU      Musculoskeletal  (+) Arthritis ,   Abdominal   Peds  Hematology   Anesthesia Other Findings   Reproductive/Obstetrics                             Anesthesia Physical Anesthesia Plan  ASA: III  Anesthesia Plan: General   Post-op Pain Management:    Induction: Intravenous  PONV Risk Score and Plan: 2 and Ondansetron, Dexamethasone and Treatment may vary due to age or medical condition  Airway Management Planned: Oral ETT  Additional Equipment:   Intra-op Plan:   Post-operative Plan: Extubation in OR  Informed Consent: I have reviewed the patients History and Physical, chart, labs and discussed the procedure including the risks, benefits and alternatives for the proposed anesthesia with the patient or authorized representative who has indicated his/her understanding and acceptance.     Plan Discussed with: CRNA, Anesthesiologist and Surgeon  Anesthesia Plan Comments:         Anesthesia Quick Evaluation

## 2016-10-24 NOTE — Progress Notes (Signed)
Call to Dr. Marcie Bal, pt's pain remains 10/10 after Fentanyl 150 mcg, bp 170/77. Orders received to give Dilaudid .25-.5 q 5 min up to 2 mg total.

## 2016-10-24 NOTE — Progress Notes (Signed)
CSW attempted to meet with patient to complete assessment, but was unable to do so due to patient being in operating room. Per ED Pod A secretary, patient is will be relocated to 5N after surgery.  CSW will write handoff for weekday CSW to follow up if needed for placement.   Madilyn Fireman, MSW, LCSW-A Weekend Clinical Social Worker [phone]

## 2016-10-24 NOTE — Transfer of Care (Signed)
Immediate Anesthesia Transfer of Care Note  Patient: [name]  Procedure(s) Performed: TOTAL HIP ARTHROPLASTY ANTERIOR APPROACH (Left Hip)  Patient Location: PACU  Anesthesia Type:General  Level of Consciousness: awake, alert , oriented and patient cooperative  Airway & Oxygen Therapy: Patient Spontanous Breathing and Patient connected to face mask oxygen  Post-op Assessment: Report given to RN and Post -op Vital signs reviewed and stable  Post vital signs: Reviewed and stable  Last Vitals:  Vitals:   10/24/16 0730 10/24/16 1230  BP: 109/81 (!) 160/89  Pulse: 89 75  Resp: 15   Temp:  (!) 36.4 C  SpO2: 99%     Last Pain:  Vitals:   10/24/16 0845  TempSrc:   PainSc: 8          Complications: No apparent anesthesia complications

## 2016-10-24 NOTE — Op Note (Signed)
OPERATIVE REPORT  SURGEON: Rod Can, MD   ASSISTANT: Wyatt Portela, PA-C.  PREOPERATIVE DIAGNOSIS: Displaced Left femoral neck fracture.   POSTOPERATIVE DIAGNOSIS: Displaced Left femoral neck fracture.  PROCEDURE: Left total hip arthroplasty, anterior approach.   IMPLANTS: DePuy Tri Lock stem, size 8, standard offset. DePuy Pinnacle Cup, size 56 mm. DePuy Altrx liner, size 36 by 56 mm, +4 neutral. DePuy Biolox ceramic head ball, size 36 + 1.5 mm.  ANESTHESIA:  General  ESTIMATED BLOOD LOSS: 650 mL.  ANTIBIOTICS: 2 g Ancef. 1 g Vancomycin.  DRAINS: None.  COMPLICATIONS: None.   CONDITION: PACU - hemodynamically stable.   BRIEF CLINICAL NOTE: [name] is a 71 y.o. male with a displaced Left femoral neck fracture. The patient was indicated for total hip arthroplasty. The risks, benefits, and alternatives to the procedure were explained, and the patient elected to proceed.  PROCEDURE IN DETAIL: Surgical site was marked by myself in the pre-op holding area. Once inside the operating room, spinal anesthesia was obtained, and a foley catheter was inserted. The patient was then positioned on the Hana table. All bony prominences were well padded. The hip was prepped and draped in the normal sterile surgical fashion. A time-out was called verifying side and site of surgery. The patient received IV antibiotics within 60 minutes of beginning the procedure.  The direct anterior approach to the hip was performed through the Hueter interval. Lateral femoral circumflex vessels were treated with the Auqumantys. The anterior capsule was exposed and an inverted T capsulotomy was made.The fracture hematoma was evacuated. The fracture was identified. The femoral neck cut was made to the level of the templated cut. A corkscrew was placed into the head and the head was removed. The femoral head was found to have eburnated bone. The head was passed to the back table and was  measured.  Acetabular exposure was achieved, and the pulvinar and labrum were excised. Sequential reaming of the acetabulum was then performed up to a size 55 mm reamer. A 56 mm cup was then opened and impacted into place at approximately 40 degrees of abduction and 20 degrees of anteversion. The final polyethylene liner was impacted into place and acetabular osteophytes were removed.   I then gained femoral exposure taking care to protect the abductors and greater trochanter. This was performed using standard external rotation, extension, and adduction. The capsule was peeled off the inner aspect of the greater trochanter, taking care to preserve the short external rotators. A cookie cutter was used to enter the femoral canal, and then the femoral canal finder was placed. Sequential broaching was performed up to a size 8. Calcar planer was used on the femoral neck remnant. I placed a std offset neck and a trial head ball. The hip was reduced. Leg lengths and offset were checked fluoroscopically. The hip was dislocated and trial components were removed. The final implants were placed, and the hip was reduced.  Fluoroscopy was used to confirm component position and leg lengths. At 90 degrees of external rotation and full extension, the hip was stable to an anterior directed force.  The wound was copiously irrigated with normal saline using pulse lavage. Marcaine solution was injected into the periarticular soft tissue. The wound was closed in layers using #1 Vicryl and V-Loc for the fascia, 2-0 Vicryl for the subcutaneous fat, 2-0 Monocryl for the deep dermal layer, 3-0 running Monocryl subcuticular stitch, and Dermabond for the skin. Once the glue was fully dried, an Aquacell Ag dressing  was applied. The patient was transported to the recovery room in stable condition. Sponge, needle, and instrument counts were correct at the end of the case x2. The patient tolerated the procedure well and  there were no known complications.  Please note that a surgical assistant was a medical necessity for this procedure to perform it in a safe and expeditious manner. Assistant was necessary to provide appropriate retraction of vital neurovascular structures, to prevent femoral fracture, and to allow for anatomic placement of the prosthesis.

## 2016-10-24 NOTE — Consult Note (Signed)
ORTHOPAEDIC CONSULTATION  REQUESTING PHYSICIAN: Kerney Elbe, DO  PCP:  Lucille Passy, MD  Chief Complaint: left hip fracture.  HPI: [name] is a 71 y.o. male who complains of left hip pain. Last night, the patient fell out of a chair onto his left hip. He had left hip pain and inability to weight-bear. He came to the emergency Department at [facility], where x-rays revealed a displaced left femoral neck fracture. Patient does drink 1-2 bottles of wine per day. He denies other injuries. Normally, he does not use any assist devices. He is a Hydrographic surveyor.  Past Medical History:  Diagnosis Date  . Adenomatous polyp of colon 2006  . Anemia 2012  . Anxiety   . Arthritis    "left arm/shoulder" (05/17/2016)  . Atrial tachycardia, paroxysmal ([place]) 04/07/2015  . BPH (benign prostatic hypertrophy) 5/99  . Chronic bronchitis ([place])    "q year or q other year" (11/02/2011)  . Chronic nausea    "a few days/week" (05/17/2016)  . Colon polyp   . DCM (dilated cardiomyopathy) ([place])    EF 45-50% by echo and cath 2017  . Depression    Hx of  . Falls frequently    "daily lately; legs just give out" (11/02/2011; 05/17/2016)  . GERD (gastroesophageal reflux disease) 07/31/04   gastritis   . H/O hiatal hernia   . Hard of hearing   . Heart murmur   . Hernia, umbilical    "unrepaired" (11/02/2011; 05/17/2016)  . History of blood transfusion 2012  . HLD (hyperlipidemia) 5/99  . HTN (hypertension) 5/99  . Memory loss    "recently has been forgetting alot; maybe related to the alcohol" (11/02/2011; 05/17/2016)  . Mild CAD    10% RCA  . PAC (premature atrial contraction) 04/07/2015  . Peripheral vascular disease ([place])   . Pneumonia 1996   hosp  . Pollen allergy    pollen/ragweed  . Seasonal allergies    Past Surgical History:  Procedure Laterality Date  . BACK SURGERY    . CARDIAC CATHETERIZATION N/A 05/23/2015   Procedure: Left Heart Cath and Coronary Angiography;   Surgeon: Troy Sine, MD;  Location: [facility];  Service: Cardiovascular;  Laterality: N/A;  . CATARACT EXTRACTION W/ INTRAOCULAR LENS  IMPLANT, BILATERAL Bilateral 10/2002  . COLONOSCOPY  07/31/04   multiple polyps; repeat in 3 years  . DUPUYTREN CONTRACTURE RELEASE Left   . ETT myoview  03/09/07   nml EF 66%  . FRACTURE SURGERY    . HEMORRHOID SURGERY     "cauterized a long time ago" (11/02/2011)  . hosp CP R/O'D 2/24  03/08/07  . neck MRI  6/04   C5-6 disc abnormality  . ORIF HUMERUS FRACTURE  11/04/2011   Procedure: OPEN REDUCTION INTERNAL FIXATION (ORIF) PROXIMAL HUMERUS FRACTURE;  Surgeon: Rozanna Box, MD;  Location: [facility];  Service: Orthopedics;  Laterality: Left;  . POSTERIOR CERVICAL FUSION/FORAMINOTOMY N/A 03/21/2012   Procedure: POSTERIOR CERVICAL FUSION/FORAMINOTOMY LEVEL ONE;  Surgeon: Charlie Pitter, MD;  Location: [facility];  Service: Neurosurgery;  Laterality: N/A;  POSTERIOR CERVICAL ONE TO CERVICAL TWO FUSION WITH ILIAC CREST GRAFT AND LATERAL MASS SCREWS   . TONSILLECTOMY     "I was young" (11/02/2011)  . UPPER GASTROINTESTINAL ENDOSCOPY     Social History   Social History  . Marital status: Married    Spouse name: N/A  . Number of children: 3  . Years of education:  N/A   Occupational History  . color matcher [employer]   Social History Main Topics  . Smoking status: Former Smoker    Types: Pipe  . Smokeless tobacco: Never Used     Comment: 05/17/2016 "stopped in the late 1990s"  . Alcohol use 67.2 oz/week    112 Glasses of wine per week     Comment: 11/02/2011 "large box of wine q 2 days" 01-27-15 2 glass a day of wine; 05/17/2016 "1.75L bottles; 1- 1 1/2 bottles/day; somedays worse than others; at least 3-4 days/week he'll have 1 1/2 bottles; last drink was 05/16/2016" (05/17/2016)  . Drug use: No  . Sexual activity: No   Other Topics Concern  . None   Social History Narrative   Married (remarried), lives with wife; 3 children, 1 at home.     [employer] - mixes colors       Pt revised designated party release form Naomi Fitton, wife [phone] - can leave msg.     Allen Norris 03/04/10.        Would desire CPR.  Would not want prolonged life support if futile.   Family History  Problem Relation Age of Onset  . Heart disease Father   . Diabetes Father   . Stroke Father   . Depression Mother   . Heart disease Brother   . Alcohol abuse Brother   . Liver disease Brother    Allergies  Allergen Reactions  . Nifedipine Swelling    REACTION: SWELLING, 11/02/2011 pt denies this allergy.   Prior to Admission medications   Medication Sig Start Date End Date Taking? Authorizing Provider  cetirizine (ZYRTEC) 10 MG tablet Take 1 tablet (10 mg total) by mouth daily. 09/15/16  Yes Lucille Passy, MD  Cyanocobalamin (VITAMIN B-12 PO) Take 1 tablet by mouth daily.   Yes [provider]  cyclobenzaprine (FLEXERIL) 10 MG tablet Take 1 tablet (10 mg total) by mouth 3 (three) times daily as needed for muscle spasms. 09/15/16  Yes Lucille Passy, MD  docusate sodium (COLACE) 100 MG capsule Take 1 capsule (100 mg total) by mouth daily as needed for mild constipation. 05/26/16  Yes Ngetich, Dinah C, NP  ferrous sulfate 325 (65 FE) MG tablet Take 325 mg by mouth daily with breakfast.   Yes [provider]  furosemide (LASIX) 40 MG tablet Take 1 tablet (40 mg total) by mouth daily. 09/15/16 09/15/17 Yes Lucille Passy, MD  lidocaine (LIDODERM) 5 % Place 1 patch onto the skin daily. Remove & Discard patch within 12 hours or as directed by MD Patient taking differently: Place 1 patch onto the skin daily as needed (pain). Remove & Discard patch within 12 hours or as directed by MD 05/11/16  Yes Lucille Passy, MD  oxyCODONE (OXY IR/ROXICODONE) 5 MG immediate release tablet Take 1 tablet (5 mg total) by mouth every 6 (six) hours as needed for severe pain. 09/15/16  Yes Lucille Passy, MD  polyethylene glycol [facility] / Floria Raveling) packet  Take 17 g by mouth daily. Patient taking differently: Take 17 g by mouth daily as needed for mild constipation.  05/23/16  Yes Rosita Fire, MD  potassium chloride SA (KLOR-CON M15) 15 MEQ tablet Take 2 tablets (30 mEq total) by mouth daily. Patient taking differently: Take 15 mEq by mouth daily.  09/15/16  Yes Lucille Passy, MD  prednisoLONE acetate (PRED FORTE) 1 % ophthalmic suspension Place 1 drop into both eyes  3 (three) times daily.   Yes [provider]  predniSONE (DELTASONE) 10 MG tablet Take 5 mg by mouth daily with breakfast.    Yes [provider]  promethazine (PHENERGAN) 25 MG tablet TAKE 1 TABLET DAILY AS NEEDED FOR NAUSEA 03/09/16  Yes Lucille Passy, MD  QUEtiapine (SEROQUEL) 300 MG tablet Take 1 tablet (300 mg total) by mouth at bedtime. 07/08/16  Yes Lucille Passy, MD  Thiamine HCl (VITAMIN B-1) 100 MG tablet Take 100 mg by mouth daily. Reported on 02/17/2015   Yes [provider]  TOPROL XL 25 MG 24 hr tablet TAKE 1 TABLET DAILY 05/11/16  Yes Turner, Eber Hong, MD  traZODone (DESYREL) 50 MG tablet Take 1 tablet (50 mg total) by mouth at bedtime. 07/08/16  Yes Lucille Passy, MD   Dg Chest 1 View  Result Date: 10/23/2016 CLINICAL DATA:  FALL today- pt fell while trying to sit in a rolling chair. Left hip pain. Hx left shoulder surgery. EXAM: CHEST 1 VIEW COMPARISON:  05/20/2016 FINDINGS: Normal cardiac silhouette. Lungs are clear. No effusion, infiltrate pneumothorax. Remote LEFT rib fractures noted. IMPRESSION: No acute cardiopulmonary process. Electronically Signed   By: Suzy Bouchard M.D.   On: 10/23/2016 21:07   Dg Lumbar Spine 2-3 Views  Result Date: 10/23/2016 CLINICAL DATA:  71 year old male status post fall today with pain radiating to the left hip. EXAM: LUMBAR SPINE - 2-3 VIEW COMPARISON:  CT Abdomen and Pelvis 05/18/2016. FINDINGS: Normal lumbar segmentation. Stable mild L1 compression fracture. Other lumbar vertebral height and alignment is  preserved. Vacuum disc at L3-L4. T12 level appears intact. Grossly intact visible sacrum. Calcified atherosclerosis in the abdomen. IMPRESSION: 1.  No acute fracture or listhesis in the lumbar spine. 2. Mild chronic L1 compression fracture. Electronically Signed   By: Genevie Ann M.D.   On: 10/23/2016 21:11   Dg Pelvis 1-2 Views  Result Date: 10/23/2016 CLINICAL DATA:  71 year old male status post fall today with left hip pain. EXAM: PELVIS - 1-2 VIEW COMPARISON:  Left femur series today reported separately. CT Abdomen and Pelvis 05/18/2016. FINDINGS: Left femoral neck fracture re- demonstrated. Femoral heads remain normally located. Proximal right femur appears intact. Intact pelvis. Calcified iliofemoral atherosclerosis. Negative visible bowel gas pattern. IMPRESSION: 1. Left femoral neck fracture, see left femur comparison. 2. No superimposed pelvis fracture. Electronically Signed   By: Genevie Ann M.D.   On: 10/23/2016 21:09   Dg Abd 1 View  Result Date: 10/24/2016 CLINICAL DATA:  Abdominal distention EXAM: ABDOMEN - 1 VIEW COMPARISON:  05/18/2016 FINDINGS: Generous volume of stool and air throughout the colon. No evidence of bowel obstruction or perforation. No biliary or urinary calculi are evident. IMPRESSION: Negative for bowel obstruction or perforation. Electronically Signed   By: Andreas Newport M.D.   On: 10/24/2016 03:57   Ct Head Wo Contrast  Result Date: 10/23/2016 CLINICAL DATA:  C-spine trauma, high clinical risk (NEXUS/CCR) Head trauma, minor, GCS>=13, high clinical risk, initial exam. Pt brought in by EMS due to falling while trying to sit in rolling chair. EXAM: CT HEAD WITHOUT CONTRAST CT CERVICAL SPINE WITHOUT CONTRAST TECHNIQUE: Multidetector CT imaging of the head and cervical spine was performed following the standard protocol without intravenous contrast. Multiplanar CT image reconstructions of the cervical spine were also generated. COMPARISON:  None. FINDINGS: CT HEAD FINDINGS  Brain: No intracranial hemorrhage. No parenchymal contusion. No midline shift or mass effect. Basilar cisterns are patent. No skull base fracture. No fluid  in the paranasal sinuses or mastoid air cells. Orbits are normal. There are periventricular and subcortical white matter hypodensities. Generalized cortical atrophy. Vascular: No hyperdense vessel or unexpected calcification. Skull: Normal. Negative for fracture or focal lesion. Sinuses/Orbits: Paranasal sinuses and mastoid air cells are clear. Orbits are clear. Other: None. CT CERVICAL SPINE FINDINGS Alignment: Normal alignment. Skull base and vertebrae: Remote fracture of the dense without subluxation. Cerclage wire posteriorly at C1-C2. Bilateral pedicle screws at C1-C2. No evidence acute fracture dislocation. Soft tissues and spinal canal: No prevertebral fluid or swelling. No visible canal hematoma. Disc levels: Mild disc space narrowing in the lower cervical spine with osteophytosis Upper chest: Clear Other: None IMPRESSION: 1. No acute intracranial trauma. 2. Atrophy and microvascular disease. 3. No acute cervical spine fracture. 4. Stable fracture of the dens with posterior fusion at C1 -C2. Electronically Signed   By: Suzy Bouchard M.D.   On: 10/23/2016 21:31   Ct Cervical Spine Wo Contrast  Result Date: 10/23/2016 CLINICAL DATA:  C-spine trauma, high clinical risk (NEXUS/CCR) Head trauma, minor, GCS>=13, high clinical risk, initial exam. Pt brought in by EMS due to falling while trying to sit in rolling chair. EXAM: CT HEAD WITHOUT CONTRAST CT CERVICAL SPINE WITHOUT CONTRAST TECHNIQUE: Multidetector CT imaging of the head and cervical spine was performed following the standard protocol without intravenous contrast. Multiplanar CT image reconstructions of the cervical spine were also generated. COMPARISON:  None. FINDINGS: CT HEAD FINDINGS Brain: No intracranial hemorrhage. No parenchymal contusion. No midline shift or mass effect. Basilar  cisterns are patent. No skull base fracture. No fluid in the paranasal sinuses or mastoid air cells. Orbits are normal. There are periventricular and subcortical white matter hypodensities. Generalized cortical atrophy. Vascular: No hyperdense vessel or unexpected calcification. Skull: Normal. Negative for fracture or focal lesion. Sinuses/Orbits: Paranasal sinuses and mastoid air cells are clear. Orbits are clear. Other: None. CT CERVICAL SPINE FINDINGS Alignment: Normal alignment. Skull base and vertebrae: Remote fracture of the dense without subluxation. Cerclage wire posteriorly at C1-C2. Bilateral pedicle screws at C1-C2. No evidence acute fracture dislocation. Soft tissues and spinal canal: No prevertebral fluid or swelling. No visible canal hematoma. Disc levels: Mild disc space narrowing in the lower cervical spine with osteophytosis Upper chest: Clear Other: None IMPRESSION: 1. No acute intracranial trauma. 2. Atrophy and microvascular disease. 3. No acute cervical spine fracture. 4. Stable fracture of the dens with posterior fusion at C1 -C2. Electronically Signed   By: Suzy Bouchard M.D.   On: 10/23/2016 21:31   Dg Femur Min 2 Views Left  Result Date: 10/23/2016 CLINICAL DATA:  71 year old male status post fall today with left hip pain. EXAM: LEFT FEMUR 2 VIEWS COMPARISON:  CT Abdomen and Pelvis 05/18/2016. FINDINGS: Left femoral neck fracture with varus impaction (arrows). The intertrochanteric segment of the left femur appears intact. Left femoral head remains normally located. Grossly intact visible left hemipelvis. The more distal left femur appears intact. Preserved alignment at the left knee. Calcified peripheral vascular disease. Medial compartment knee joint degeneration. Possible small knee joint effusion. IMPRESSION: 1. Left femoral neck fracture with varus impaction. 2. No other left femur fracture identified. 3. Possible left knee joint effusion. 4. Calcified peripheral vascular  disease. Electronically Signed   By: Genevie Ann M.D.   On: 10/23/2016 21:08    Positive ROS: All other systems have been reviewed and were otherwise negative with the exception of those mentioned in the HPI and as above.  Physical Exam: General: Alert,  no acute distress Cardiovascular: No pedal edema Respiratory: No cyanosis, no use of accessory musculature GI: No organomegaly, abdomen is soft and non-tender Skin: No lesions in the area of chief complaint Neurologic: Sensation intact distally Psychiatric: Patient is competent for consent with normal mood and affect Lymphatic: No axillary or cervical lymphadenopathy  MUSCULOSKELETAL:   BUE/RLE: No wounds or lesions. No swelling, crepitation, or tenderness to palpation. Full painless range of motion. Normal strength. Intact sensation. Pulses present.  LLE: no skin wounds or lesions. The extremity is shortened and externally rotated. He has pain with attempted log. He does have positive motor function dorsiflexion, plantarflexion, great toe extension. He reports intact sensation. He has palpable pulses.  Assessment: #1 displaced left femoral neck fracture. #2 alcohol abuse.  Plan: I discussed the findings with the patient and his family. We discussed the risks, benefits, and alternatives to left total hip arthroplasty. The patient and his family understand these risks and elect to proceed. We'll plan for surgery today. We discussed a normal postoperative recovery.  The risks, benefits, and alternatives were discussed with the patient. There are risks associated with the surgery including, but not limited to, problems with anesthesia (death), infection, instability (giving out of the joint), dislocation, differences in leg length/angulation/rotation, fracture of bones, loosening or failure of implants, hematoma (blood accumulation) which may require surgical drainage, blood clots, pulmonary embolism, nerve injury (foot drop and lateral thigh  numbness), and blood vessel injury. The patient understands these risks and elects to proceed.   Rocio Roam, Horald Pollen, MD Cell [phone]    10/24/2016 9:10 AM

## 2016-10-24 NOTE — Discharge Instructions (Signed)
°Dr. Caitlyne Ingham °Joint Replacement Specialist °[facility] °[address] °[place], [place] [address] °[phone] ° ° °TOTAL HIP REPLACEMENT POSTOPERATIVE DIRECTIONS ° ° ° °Hip Rehabilitation, Guidelines Following Surgery  ° °WEIGHT BEARING °Weight bearing as tolerated with assist device (walker, cane, etc) as directed, use it as long as suggested by your surgeon or therapist, typically at least 4-6 weeks. ° °The results of a hip operation are greatly improved after range of motion and muscle strengthening exercises. Follow all safety measures which are given to protect your hip. If any of these exercises cause increased pain or swelling in your joint, decrease the amount until you are comfortable again. Then slowly increase the exercises. Call your caregiver if you have problems or questions.  ° °HOME CARE INSTRUCTIONS  °Most of the following instructions are designed to prevent the dislocation of your new hip.  °Remove items at home which could result in a fall. This includes throw rugs or furniture in walking pathways.  °Continue medications as instructed at time of discharge. °· You may have some home medications which will be placed on hold until you complete the course of blood thinner medication. °· You may start showering once you are discharged home. Do not remove your dressing. °Do not put on socks or shoes without following the instructions of your caregivers.   °Sit on chairs with arms. Use the chair arms to help push yourself up when arising.  °Arrange for the use of a toilet seat elevator so you are not sitting low.  °· Walk with walker as instructed.  °You may resume a sexual relationship in one month or when given the OK by your caregiver.  °Use walker as long as suggested by your caregivers.  °You may put full weight on your legs and walk as much as is comfortable. °Avoid periods of inactivity such as sitting longer than an hour when not asleep. This helps prevent  blood clots.  °You may return to work once you are cleared by your surgeon.  °Do not drive a car for 6 weeks or until released by your surgeon.  °Do not drive while taking narcotics.  °Wear elastic stockings for two weeks following surgery during the day but you may remove then at night.  °Make sure you keep all of your appointments after your operation with all of your doctors and caregivers. You should call the office at the above phone number and make an appointment for approximately two weeks after the date of your surgery. °Please pick up a stool softener and laxative for home use as long as you are requiring pain medications. °· ICE to the affected hip every three hours for 30 minutes at a time and then as needed for pain and swelling. Continue to use ice on the hip for pain and swelling from surgery. You may notice swelling that will progress down to the foot and ankle.  This is normal after surgery.  Elevate the leg when you are not up walking on it.   °It is important for you to complete the blood thinner medication as prescribed by your doctor. °· Continue to use the breathing machine which will help keep your temperature down.  It is common for your temperature to cycle up and down following surgery, especially at night when you are not up moving around and exerting yourself.  The breathing machine keeps your lungs expanded and your temperature down. ° °RANGE OF MOTION AND STRENGTHENING EXERCISES  °These exercises are   designed to help you keep full movement of your hip joint. Follow your caregiver's or physical therapist's instructions. Perform all exercises about fifteen times, three times per day or as directed. Exercise both hips, even if you have had only one joint replacement. These exercises can be done on a training (exercise) mat, on the floor, on a table or on a bed. Use whatever works the best and is most comfortable for you. Use music or television while you are exercising so that the exercises  are a pleasant break in your day. This will make your life better with the exercises acting as a break in routine you can look forward to.  °Lying on your back, slowly slide your foot toward your buttocks, raising your knee up off the floor. Then slowly slide your foot back down until your leg is straight again.  °Lying on your back spread your legs as far apart as you can without causing discomfort.  °Lying on your side, raise your upper leg and foot straight up from the floor as far as is comfortable. Slowly lower the leg and repeat.  °Lying on your back, tighten up the muscle in the front of your thigh (quadriceps muscles). You can do this by keeping your leg straight and trying to raise your heel off the floor. This helps strengthen the largest muscle supporting your knee.  °Lying on your back, tighten up the muscles of your buttocks both with the legs straight and with the knee bent at a comfortable angle while keeping your heel on the floor.  ° °SKILLED REHAB INSTRUCTIONS: °If the patient is transferred to a skilled rehab facility following release from the hospital, a list of the current medications will be sent to the facility for the patient to continue.  When discharged from the skilled rehab facility, please have the facility set up the patient's Home Health Physical Therapy prior to being released. Also, the skilled facility will be responsible for providing the patient with their medications at time of release from the facility to include their pain medication and their blood thinner medication. If the patient is still at the rehab facility at time of the two week follow up appointment, the skilled rehab facility will also need to assist the patient in arranging follow up appointment in our office and any transportation needs. ° °MAKE SURE YOU:  °Understand these instructions.  °Will watch your condition.  °Will get help right away if you are not doing well or get worse. ° °Pick up stool softner and  laxative for home use following surgery while on pain medications. °Do not remove your dressing. °The dressing is waterproof--it is OK to take showers. °Continue to use ice for pain and swelling after surgery. °Do not use any lotions or creams on the incision until instructed by your surgeon. °Total Hip Protocol. ° ° °

## 2016-10-24 NOTE — OR Nursing (Signed)
X-ray at bedside, x-rays of left hip completed.

## 2016-10-24 NOTE — Anesthesia Procedure Notes (Signed)
Procedure Name: Intubation Date/Time: 10/24/2016 10:08 AM Performed by: Everlean Cherry A Pre-anesthesia Checklist: Patient identified, Emergency Drugs available, Suction available and Patient being monitored Patient Re-evaluated:Patient Re-evaluated prior to induction Oxygen Delivery Method: Circle system utilized Preoxygenation: Pre-oxygenation with 100% oxygen Induction Type: IV induction, Rapid sequence and Cricoid Pressure applied Laryngoscope Size: Glidescope and 4 Grade View: Grade I Tube type: Oral Tube size: 7.5 mm Number of attempts: 1 Airway Equipment and Method: Video-laryngoscopy and Rigid stylet Placement Confirmation: ETT inserted through vocal cords under direct vision,  positive ETCO2 and breath sounds checked- equal and bilateral Secured at: 23 cm Tube secured with: Tape Dental Injury: Teeth and Oropharynx as per pre-operative assessment  Difficulty Due To: Difficulty was anticipated and Difficult Airway- due to reduced neck mobility

## 2016-10-24 NOTE — Progress Notes (Signed)
PROGRESS NOTE    [name]  UKG:[id number] DOB: August 05, 1945 DOA: 10/23/2016 PCP: Lucille Passy, MD   Brief Narrative:  [name] is a 71 y.o. male with medical history significant of ETOH abuse, Multiple fractures, HTN, GERD, HLD, Dementia and other comorbids who presented with fall tripped over roling chair a few times. He fell face first one time and with the next fall could not get up and landed on his left Hip and had hip pain and inability to bear weight. He drinks about 2 bottles of wine a day. X-Ray showed Femoral Neck Fracture and Orthopedic Surgery was consulted and patient under went Left Total Hip Arthoplasty from an Anterior Approach.  Assessment & Plan:   Active Problems:   HLD (hyperlipidemia)   Alcohol abuse   Essential hypertension   ALCOHOLIC CIRRHOSIS OF LIVER   Hyponatremia   DCM (dilated cardiomyopathy) (HCC)   Chronic pain syndrome   ETOH abuse   Anemia   Closed displaced fracture of left femoral neck (HCC)   Hip fracture (HCC)   Displaced fracture of left femoral neck (HCC)  Closed Displaced Left Femoral Neck Fracture s/p Left Total Hip Arthroplasty  Anterior Approach POD 0 -Management per Ortho; Patient went to Surgery this AM -Head CT and Cervical Spine CT w/o Contrast showed No acute intracranial trauma. Atrophy and microvascular disease. No acute cervical spine fracture.Stable fracture of the dens with posterior fusion at C1 -C2. -Xray of Pelvis showed Left femoral neck fracture, see left femur comparison. No superimposed pelvis fracture -Lumbar X-ray showed No acute fracture or listhesis in the lumbar spine. Mild chronic L1 compression fracture. -CXR showed no No acute cardiopulmonary process. -At baseline patient is able to walk a flight of stairs or 100 feet -C/w Foley -EKG showed no evidence of acute ischemia -Moderate Disease -Recent ECHO reassuring -Pain Control in PACU with 25-50 mcg IV q5hprn up to 150 mcg -C/w Hydrocodone 1-2 tab po  q6hprn Moderate Pain -C/w Hydromorphone 0.5 mg IV q4hprn Severe Pain; Will stop IV Morphine -Bowel Regimen with Senna po BID, Miralax Daily -PT/OT to Evaluate and Treat -Abx Prophylaxis per Ortho -Pharmacological Prophylaxis per Ortho  Alcoholic Liver Cirrhosis -AST was elevated at 80 and ALT was 50; INR was 1.11 -Ammonia was 20 -Patient continues to Drink Alcohol  -Currently INR albumin seems to be stable.  -Spoke about importance of discontinuing alcohol abuse  Alcohol Abuse/Concern for Withdrawal -Drinks a couple Development worker, community of Wine daily -Monitor for withdrawal order on CIWA protocol -C/w Lorazepam IV -C/w Folic Acid 1 mg, MVI, Thiamine  Normocytic Anemia -Patient's Hb/Hct went from 12.9/36.7 -> 11.9/34.4 -Continue to Monitor for S/Sx of Bleeding -Repeat CBC in AM    Essential Hypertension -Stable continue home medications when able to tolerate by mouth's  Hyponatremia -Slightly improved. Na+ went from 129 -> 132 -Likely 2/2 to Alcohol Abuse and liver disease  -Obtain urine electrolytes -Continue to Monitor and Repeat CMP  History of Systolic CHF  -Last echogram showed preserved EF  -Currently appears to be hemodynamically stable  History of Ascites  -Some abdominal distention noted but tympanic will obtain KUB first evaluate for any evidence of ileus  Thrombocytopenia -Mild. Platelet Count was 123 -Likely secondary to Alcohol Abuse  DVT prophylaxis: SCDs'; Pharmacologic Prophylaxis per Ortho Code Status: FULL CODE Family Communication: No Family present at bedside Disposition Plan: Remain Inpatient; Likely SNF  Consultants:   Orthopedic Surgery Dr. Lyla Glassing    Procedures:  Displaced Left Femoral Neck Fracture s/p  Left Total Hip Arthroplasty  Anterior Approach   Antimicrobials:  Anti-infectives    Start     Dose/Rate Route Frequency Ordered Stop   10/24/16 1445  vancomycin (VANCOCIN) IVPB 1000 mg/200 mL premix     1,000 mg 200 mL/hr over 60 Minutes  Intravenous Every 12 hours 10/24/16 1436 10/25/16 0244   10/24/16 1130  vancomycin (VANCOCIN) IVPB 1000 mg/200 mL premix     1,000 mg 200 mL/hr over 60 Minutes Intravenous  Once 10/24/16 1118 10/24/16 1249   10/24/16 0915  ceFAZolin (ANCEF) IVPB 2g/100 mL premix  Status:  Discontinued     2 g 200 mL/hr over 30 Minutes Intravenous On call to O.R. 10/24/16 0910 10/24/16 0917   10/24/16 0906  ceFAZolin (ANCEF) 2-4 GM/100ML-% IVPB    Comments:  Laurita Quint   : cabinet override      10/24/16 0906 10/24/16 1002   10/24/16 0600  ceFAZolin (ANCEF) IVPB 2g/100 mL premix     2 g 200 mL/hr over 30 Minutes Intravenous On call to O.R. 10/24/16 0439 10/24/16 1002     Subjective: Seen and examined in PACU. Was shivering and stated he was in pain. No SOB or CP. Nursing about to move him to the floor.   Objective: Vitals:   10/24/16 1330 10/24/16 1400 10/24/16 1415 10/24/16 1438  BP: (!) 163/93 139/90 (!) 146/97 (!) 151/82  Pulse: 82   88  Resp: 14     Temp:   (!) 97.5 F (36.4 C) 98 F (36.7 C)  TempSrc:    Oral  SpO2: 98%   95%  Weight:    81.6 kg (180 lb)  Height:    5\' 9"  (1.753 m)    Intake/Output Summary (Last 24 hours) at 10/24/16 1457 Last data filed at 10/24/16 1241  Gross per 24 hour  Intake             1910 ml  Output              700 ml  Net             1210 ml   Filed Weights   10/23/16 2017 10/24/16 1438  Weight: 81.6 kg (180 lb) 81.6 kg (180 lb)   Examination: Physical Exam:  Constitutional: WN/WD obese Caucasian male in NAD and appears calm  Eyes: Lids and conjunctivae normal, sclerae anicteric; Miotic pupils ENMT: External Ears, Nose appear normal. Hard of hearing. Mucous membranes are moist.  Neck: Appears normal, supple, no cervical masses, normal ROM, no appreciable thyromegaly, no JVD Respiratory: Diminished to auscultation bilaterally, no wheezing, rales, rhonchi or crackles. Normal respiratory effort and patient is not tachypenic. No accessory muscle use.    Cardiovascular: RRR, no murmurs / rubs / gallops. S1 and S2 auscultated. No extremity edema.  Abdomen: Soft, non-tender, Distended. No masses palpated. No appreciable hepatosplenomegaly. Bowel sounds positive x4.  GU: Deferred. Musculoskeletal: No clubbing / cyanosis of digits/nails. No joint deformity upper and lower extremities.  Skin: No rashes, lesions, ulcers on a limited skin eval. No induration; Warm and dry. Incision looks C/D/I Neurologic: CN 2-12 grossly intact with no focal deficits. Romberg sign cerebellar reflexes not assessed.  Psychiatric: Impaired judgment and insight. Alert and awake. Normal mood and appropriate affect.   Data Reviewed: I have personally reviewed following labs and imaging studies  CBC:  Recent Labs Lab 10/23/16 2014 10/24/16 0429  WBC 5.0 8.4  NEUTROABS 3.7  --   HGB 12.9* 11.9*  HCT 36.7* 34.4*  MCV 86.8 87.5  PLT 123* 297*   Basic Metabolic Panel:  Recent Labs Lab 10/23/16 2032 10/24/16 0429  NA 129* 132*  K 4.0 3.8  CL 88* 91*  CO2 25 25  GLUCOSE 118* 112*  BUN 7 5*  CREATININE 0.76 0.73  CALCIUM 8.6* 8.2*   GFR: Estimated Creatinine Clearance: 85.9 mL/min (by C-G formula based on SCr of 0.73 mg/dL). Liver Function Tests:  Recent Labs Lab 10/23/16 2032  AST 80*  ALT 50  ALKPHOS 103  BILITOT 0.9  PROT 6.9  ALBUMIN 4.0   No results for input(s): LIPASE, AMYLASE in the last 168 hours.  Recent Labs Lab 10/24/16 0500  AMMONIA 20   Coagulation Profile:  Recent Labs Lab 10/23/16 2014  INR 1.11   Cardiac Enzymes: No results for input(s): CKTOTAL, CKMB, CKMBINDEX, TROPONINI in the last 168 hours. BNP (last 3 results) No results for input(s): PROBNP in the last 8760 hours. HbA1C: No results for input(s): HGBA1C in the last 72 hours. CBG: No results for input(s): GLUCAP in the last 168 hours. Lipid Profile: No results for input(s): CHOL, HDL, LDLCALC, TRIG, CHOLHDL, LDLDIRECT in the last 72 hours. Thyroid  Function Tests: No results for input(s): TSH, T4TOTAL, FREET4, T3FREE, THYROIDAB in the last 72 hours. Anemia Panel: No results for input(s): VITAMINB12, FOLATE, FERRITIN, TIBC, IRON, RETICCTPCT in the last 72 hours. Sepsis Labs: No results for input(s): PROCALCITON, LATICACIDVEN in the last 168 hours.  Recent Results (from the past 240 hour(s))  Surgical pcr screen     Status: Abnormal   Collection Time: 10/24/16  9:21 AM  Result Value Ref Range Status   MRSA, PCR POSITIVE (A) NEGATIVE Final    Comment: RESULT CALLED TO, READ BACK BY AND VERIFIED WITH: BAILEY RN AT 1115 ON [id number] BY SJW    Staphylococcus aureus POSITIVE (A) NEGATIVE Final    Comment: RESULT CALLED TO, READ BACK BY AND VERIFIED WITH: BAILEY RN AT 1115 ON [id number] BY SJW (NOTE) The Xpert SA Assay (FDA approved for NASAL specimens in patients 79 years of age and older), is one component of a comprehensive surveillance program. It is not intended to diagnose infection nor to guide or monitor treatment.     Radiology Studies: Dg Chest 1 View  Result Date: 10/23/2016 CLINICAL DATA:  FALL today- pt fell while trying to sit in a rolling chair. Left hip pain. Hx left shoulder surgery. EXAM: CHEST 1 VIEW COMPARISON:  05/20/2016 FINDINGS: Normal cardiac silhouette. Lungs are clear. No effusion, infiltrate pneumothorax. Remote LEFT rib fractures noted. IMPRESSION: No acute cardiopulmonary process. Electronically Signed   By: Suzy Bouchard M.D.   On: 10/23/2016 21:07   Dg Lumbar Spine 2-3 Views  Result Date: 10/23/2016 CLINICAL DATA:  71 year old male status post fall today with pain radiating to the left hip. EXAM: LUMBAR SPINE - 2-3 VIEW COMPARISON:  CT Abdomen and Pelvis 05/18/2016. FINDINGS: Normal lumbar segmentation. Stable mild L1 compression fracture. Other lumbar vertebral height and alignment is preserved. Vacuum disc at L3-L4. T12 level appears intact. Grossly intact visible sacrum. Calcified atherosclerosis in the  abdomen. IMPRESSION: 1.  No acute fracture or listhesis in the lumbar spine. 2. Mild chronic L1 compression fracture. Electronically Signed   By: Genevie Ann M.D.   On: 10/23/2016 21:11   Dg Pelvis 1-2 Views  Result Date: 10/23/2016 CLINICAL DATA:  71 year old male status post fall today with left hip pain. EXAM: PELVIS - 1-2 VIEW COMPARISON:  Left femur series today reported separately.  CT Abdomen and Pelvis 05/18/2016. FINDINGS: Left femoral neck fracture re- demonstrated. Femoral heads remain normally located. Proximal right femur appears intact. Intact pelvis. Calcified iliofemoral atherosclerosis. Negative visible bowel gas pattern. IMPRESSION: 1. Left femoral neck fracture, see left femur comparison. 2. No superimposed pelvis fracture. Electronically Signed   By: Genevie Ann M.D.   On: 10/23/2016 21:09   Dg Abd 1 View  Result Date: 10/24/2016 CLINICAL DATA:  Abdominal distention EXAM: ABDOMEN - 1 VIEW COMPARISON:  05/18/2016 FINDINGS: Generous volume of stool and air throughout the colon. No evidence of bowel obstruction or perforation. No biliary or urinary calculi are evident. IMPRESSION: Negative for bowel obstruction or perforation. Electronically Signed   By: Andreas Newport M.D.   On: 10/24/2016 03:57   Ct Head Wo Contrast  Result Date: 10/23/2016 CLINICAL DATA:  C-spine trauma, high clinical risk (NEXUS/CCR) Head trauma, minor, GCS>=13, high clinical risk, initial exam. Pt brought in by EMS due to falling while trying to sit in rolling chair. EXAM: CT HEAD WITHOUT CONTRAST CT CERVICAL SPINE WITHOUT CONTRAST TECHNIQUE: Multidetector CT imaging of the head and cervical spine was performed following the standard protocol without intravenous contrast. Multiplanar CT image reconstructions of the cervical spine were also generated. COMPARISON:  None. FINDINGS: CT HEAD FINDINGS Brain: No intracranial hemorrhage. No parenchymal contusion. No midline shift or mass effect. Basilar cisterns are patent. No  skull base fracture. No fluid in the paranasal sinuses or mastoid air cells. Orbits are normal. There are periventricular and subcortical white matter hypodensities. Generalized cortical atrophy. Vascular: No hyperdense vessel or unexpected calcification. Skull: Normal. Negative for fracture or focal lesion. Sinuses/Orbits: Paranasal sinuses and mastoid air cells are clear. Orbits are clear. Other: None. CT CERVICAL SPINE FINDINGS Alignment: Normal alignment. Skull base and vertebrae: Remote fracture of the dense without subluxation. Cerclage wire posteriorly at C1-C2. Bilateral pedicle screws at C1-C2. No evidence acute fracture dislocation. Soft tissues and spinal canal: No prevertebral fluid or swelling. No visible canal hematoma. Disc levels: Mild disc space narrowing in the lower cervical spine with osteophytosis Upper chest: Clear Other: None IMPRESSION: 1. No acute intracranial trauma. 2. Atrophy and microvascular disease. 3. No acute cervical spine fracture. 4. Stable fracture of the dens with posterior fusion at C1 -C2. Electronically Signed   By: Suzy Bouchard M.D.   On: 10/23/2016 21:31   Ct Cervical Spine Wo Contrast  Result Date: 10/23/2016 CLINICAL DATA:  C-spine trauma, high clinical risk (NEXUS/CCR) Head trauma, minor, GCS>=13, high clinical risk, initial exam. Pt brought in by EMS due to falling while trying to sit in rolling chair. EXAM: CT HEAD WITHOUT CONTRAST CT CERVICAL SPINE WITHOUT CONTRAST TECHNIQUE: Multidetector CT imaging of the head and cervical spine was performed following the standard protocol without intravenous contrast. Multiplanar CT image reconstructions of the cervical spine were also generated. COMPARISON:  None. FINDINGS: CT HEAD FINDINGS Brain: No intracranial hemorrhage. No parenchymal contusion. No midline shift or mass effect. Basilar cisterns are patent. No skull base fracture. No fluid in the paranasal sinuses or mastoid air cells. Orbits are normal. There are  periventricular and subcortical white matter hypodensities. Generalized cortical atrophy. Vascular: No hyperdense vessel or unexpected calcification. Skull: Normal. Negative for fracture or focal lesion. Sinuses/Orbits: Paranasal sinuses and mastoid air cells are clear. Orbits are clear. Other: None. CT CERVICAL SPINE FINDINGS Alignment: Normal alignment. Skull base and vertebrae: Remote fracture of the dense without subluxation. Cerclage wire posteriorly at C1-C2. Bilateral pedicle screws at C1-C2. No evidence acute fracture  dislocation. Soft tissues and spinal canal: No prevertebral fluid or swelling. No visible canal hematoma. Disc levels: Mild disc space narrowing in the lower cervical spine with osteophytosis Upper chest: Clear Other: None IMPRESSION: 1. No acute intracranial trauma. 2. Atrophy and microvascular disease. 3. No acute cervical spine fracture. 4. Stable fracture of the dens with posterior fusion at C1 -C2. Electronically Signed   By: Suzy Bouchard M.D.   On: 10/23/2016 21:31   Pelvis Portable  Result Date: 10/24/2016 CLINICAL DATA:  Status post left hip replacement today for a femoral neck fracture suffered in a fall 10/23/2016. Initial encounter. EXAM: PORTABLE PELVIS 1-2 VIEWS COMPARISON:  Plain films present single-view of the pelvis 10/23/2016. FINDINGS: New left total hip arthroplasty is in place. The device is located. No fracture or other acute bony abnormality. Atherosclerosis noted. IMPRESSION: Status post left hip replacement.  No acute abnormality. Electronically Signed   By: Inge Rise M.D.   On: 10/24/2016 13:31   Dg C-arm 61-120 Min  Result Date: 10/24/2016 CLINICAL DATA:  Left hip arthroplasty. EXAM: DG C-ARM 61-120 MIN; OPERATIVE LEFT HIP WITH PELVIS COMPARISON:  10/23/2016 FINDINGS: Intraoperative images demonstrate normal alignment in the frontal projection of a total left hip arthroplasty. IMPRESSION: Normal alignment of left hip arthroplasty. Electronically  Signed   By: Aletta Edouard M.D.   On: 10/24/2016 12:24   Dg Hip Operative Unilat W Or W/o Pelvis Left  Result Date: 10/24/2016 CLINICAL DATA:  Left hip arthroplasty. EXAM: DG C-ARM 61-120 MIN; OPERATIVE LEFT HIP WITH PELVIS COMPARISON:  10/23/2016 FINDINGS: Intraoperative images demonstrate normal alignment in the frontal projection of a total left hip arthroplasty. IMPRESSION: Normal alignment of left hip arthroplasty. Electronically Signed   By: Aletta Edouard M.D.   On: 10/24/2016 12:24   Dg Femur Min 2 Views Left  Result Date: 10/23/2016 CLINICAL DATA:  71 year old male status post fall today with left hip pain. EXAM: LEFT FEMUR 2 VIEWS COMPARISON:  CT Abdomen and Pelvis 05/18/2016. FINDINGS: Left femoral neck fracture with varus impaction (arrows). The intertrochanteric segment of the left femur appears intact. Left femoral head remains normally located. Grossly intact visible left hemipelvis. The more distal left femur appears intact. Preserved alignment at the left knee. Calcified peripheral vascular disease. Medial compartment knee joint degeneration. Possible small knee joint effusion. IMPRESSION: 1. Left femoral neck fracture with varus impaction. 2. No other left femur fracture identified. 3. Possible left knee joint effusion. 4. Calcified peripheral vascular disease. Electronically Signed   By: Genevie Ann M.D.   On: 10/23/2016 21:08   Scheduled Meds: . aspirin EC  81 mg Oral BID WC  . docusate sodium  100 mg Oral BID  . fentaNYL      . fentaNYL      . [MAR Hold] folic acid  1 mg Oral Daily  . furosemide  40 mg Oral Daily  . HYDROmorphone      . [START ON 10/25/2016] Influenza vac split quadrivalent PF  0.5 mL Intramuscular Tomorrow-1000  . loratadine  10 mg Oral Daily  . [MAR Hold] LORazepam  0-4 mg Intravenous Q6H   Or  . [MAR Hold] LORazepam  0-4 mg Oral Q6H  . [MAR Hold] LORazepam  0-4 mg Intravenous Q12H   Or  . [MAR Hold] LORazepam  0-4 mg Oral Q12H  . metoprolol  succinate  25 mg Oral Daily  . [MAR Hold] multivitamin with minerals  1 tablet Oral Daily  . oxyCODONE      .  polyethylene glycol  17 g Oral Daily  . [START ON 10/25/2016] predniSONE  5 mg Oral Q breakfast  . promethazine      . QUEtiapine  300 mg Oral QHS  . scopolamine      . senna  1 tablet Oral BID  . [MAR Hold] thiamine  100 mg Oral Daily   Or  . [MAR Hold] thiamine  100 mg Intravenous Daily  . thiamine  100 mg Oral Daily  . traZODone  50 mg Oral QHS  . vitamin B-12  100 mcg Oral Daily   Continuous Infusions: . sodium chloride 125 mL/hr at 10/23/16 2149  . lactated ringers 10 mL/hr at 10/24/16 0921  . [MAR Hold] methocarbamol (ROBAXIN)  IV    . vancomycin      LOS: 0 days   Kerney Elbe, DO [facility] Pager [phone]  If 7PM-7AM, please contact night-coverage [email or web address] Password [email or web address] 10/24/2016, 2:57 PM

## 2016-10-25 ENCOUNTER — Telehealth: Payer: Self-pay

## 2016-10-25 ENCOUNTER — Encounter (HOSPITAL_COMMUNITY): Payer: Self-pay | Admitting: Orthopedic Surgery

## 2016-10-25 DIAGNOSIS — G894 Chronic pain syndrome: Secondary | ICD-10-CM

## 2016-10-25 LAB — CBC WITH DIFFERENTIAL/PLATELET
Basophils Absolute: 0 10*3/uL (ref 0.0–0.1)
Basophils Relative: 0 %
Eosinophils Absolute: 0 10*3/uL (ref 0.0–0.7)
Eosinophils Relative: 0 %
HEMATOCRIT: 24.2 % — AB (ref 39.0–52.0)
HEMOGLOBIN: 8.3 g/dL — AB (ref 13.0–17.0)
LYMPHS ABS: 0.6 10*3/uL — AB (ref 0.7–4.0)
LYMPHS PCT: 5 %
MCH: 31 pg (ref 26.0–34.0)
MCHC: 34.3 g/dL (ref 30.0–36.0)
MCV: 90.3 fL (ref 78.0–100.0)
MONOS PCT: 19 %
Monocytes Absolute: 2.4 10*3/uL — ABNORMAL HIGH (ref 0.1–1.0)
NEUTROS ABS: 9.3 10*3/uL — AB (ref 1.7–7.7)
NEUTROS PCT: 76 %
Platelets: 125 10*3/uL — ABNORMAL LOW (ref 150–400)
RBC: 2.68 MIL/uL — ABNORMAL LOW (ref 4.22–5.81)
RDW: 16.5 % — ABNORMAL HIGH (ref 11.5–15.5)
WBC: 12.2 10*3/uL — ABNORMAL HIGH (ref 4.0–10.5)

## 2016-10-25 LAB — COMPREHENSIVE METABOLIC PANEL
ALBUMIN: 3.6 g/dL (ref 3.5–5.0)
ALK PHOS: 67 U/L (ref 38–126)
ALT: 29 U/L (ref 17–63)
ANION GAP: 10 (ref 5–15)
AST: 51 U/L — ABNORMAL HIGH (ref 15–41)
BILIRUBIN TOTAL: 1.6 mg/dL — AB (ref 0.3–1.2)
BUN: 11 mg/dL (ref 6–20)
CALCIUM: 8 mg/dL — AB (ref 8.9–10.3)
CO2: 28 mmol/L (ref 22–32)
Chloride: 94 mmol/L — ABNORMAL LOW (ref 101–111)
Creatinine, Ser: 0.97 mg/dL (ref 0.61–1.24)
GFR calc non Af Amer: >60 mL/min (ref >60)
GLUCOSE: 142 mg/dL — AB (ref 65–99)
Potassium: 4.6 mmol/L (ref 3.5–5.1)
Sodium: 132 mmol/L — ABNORMAL LOW (ref 135–145)
TOTAL PROTEIN: 5.8 g/dL — AB (ref 6.5–8.1)

## 2016-10-25 LAB — PHOSPHORUS: Phosphorus: 3.2 mg/dL (ref 2.5–4.6)

## 2016-10-25 LAB — MAGNESIUM: Magnesium: 1.4 mg/dL — ABNORMAL LOW (ref 1.7–2.4)

## 2016-10-25 LAB — OSMOLALITY, URINE: OSMOLALITY UR: 394 mosm/kg (ref 300–900)

## 2016-10-25 LAB — CREATININE, URINE, RANDOM: CREATININE, URINE: 85.07 mg/dL

## 2016-10-25 LAB — SODIUM, URINE, RANDOM

## 2016-10-25 MED ORDER — OXYCODONE HCL 5 MG PO TABS: 5.0000 mg | ORAL_TABLET | Freq: Four times a day (QID) | ORAL | 0 refills | Status: DC | PRN

## 2016-10-25 MED ORDER — MAGNESIUM SULFATE 2 GM/50ML IV SOLN
2.0000 g | Freq: Once | INTRAVENOUS | Status: AC
Start: 2016-10-25 — End: 2016-10-25
  Administered 2016-10-25: 2 g via INTRAVENOUS
  Filled 2016-10-25: qty 50

## 2016-10-25 MED ORDER — ASPIRIN 81 MG PO TBEC: 81.0000 mg | DELAYED_RELEASE_TABLET | Freq: Two times a day (BID) | ORAL | 1 refills | Status: DC

## 2016-10-25 NOTE — Telephone Encounter (Signed)
Mrs Breaker said pt is admitted to [facility] but they cannot access immunization record and she wants to know last flu shot and last pneumonia shot; advised last flu shot according to immunization record was 09/09/15 and last prevnar 13 was 10/04/13 and last pneumovax 23 was 11/05/15. Pt is not due either pneumonia shot but if has not had flu shot this year pt does need flu shot. Mrs Fontenot voiced understanding.

## 2016-10-25 NOTE — Telephone Encounter (Signed)
Mrs Hsiung calls to get copy of immunization record and med list for pt to give to nurse at [facility]. I spoke with Mendel Ryder pts nurse at [facility] and Mendel Ryder can see immunization list and med list. Mrs Crossland voiced understanding.

## 2016-10-25 NOTE — Progress Notes (Signed)
Orthopedic Tech Progress Note Patient Details:  [name] 1945-01-19 [id number]  Patient ID: [name], male   DOB: 09-Sep-1945, 71 y.o.   MRN: [id number] Pt cant have ohf due to age restrictions  [name] 10/25/2016, 7:13 PM

## 2016-10-25 NOTE — Evaluation (Signed)
Occupational Therapy Evaluation Patient Details Name: [name] MRN: [id number] DOB: [birth year] Today's Date: 10/25/2016    History of Present Illness [name] a 71 y.o.malewith medical history significant of ETOH abuse, Multiple remote fractures, HTN, GERD, HLD, Dementia and other comorbids who presented with fall over rolling chair face first. Thenwith the next fall could not get up and landed on his left Hip and had hip pain and inability to bear weight. He drinks about 2 bottles of wine a day. Left femur fx, underwent left direct anterior THA   Clinical Impression   Pt reports many falls. He was able to perform self care, drive, grocery shop and cook prior to this admission. Pt presents with impaired cognition, L hip pain and decreased sitting and standing balance. He requires set up to max assist for ADL. Pt does not have assistance at home, will need post acute rehab in SNF. Will follow.   Follow Up Recommendations  SNF;Supervision/Assistance - 24 hour    Equipment Recommendations       Recommendations for Other Services       Precautions / Restrictions Precautions Precautions: Fall Restrictions Weight Bearing Restrictions: Yes LLE Weight Bearing: Weight bearing as tolerated      Mobility Bed Mobility        General bed mobility comments: pt received in chair  Transfers Overall transfer level: Needs assistance Equipment used: Rolling walker (2 wheeled) Transfers: Sit to/from Stand Sit to Stand: Min assist         General transfer comment: min A to steady and vc's for hand placement. Pt with tremor from EtOH withdrawal    Balance Overall balance assessment: Needs assistance;History of Falls Sitting-balance support: Feet supported;Single extremity supported Sitting balance-Leahy Scale: Fair     Standing balance support: Bilateral upper extremity supported Standing balance-Leahy Scale: Poor Standing balance comment: unable to release walker  in standing, posterior LOB requiring assist to recover                           ADL either performed or assessed with clinical judgement   ADL Overall ADL's : Needs assistance/impaired Eating/Feeding: Set up;Sitting   Grooming: Minimal assistance;Sitting   Upper Body Bathing: Minimal assistance;Sitting   Lower Body Bathing: Maximal assistance;Sit to/from stand   Upper Body Dressing : Minimal assistance;Sitting   Lower Body Dressing: Maximal assistance;Sit to/from stand   Toilet Transfer: Minimal assistance;Ambulation;RW   Toileting- Clothing Manipulation and Hygiene: Minimal assistance;Sit to/from stand               Vision Baseline Vision/History: Wears glasses Wears Glasses: At all times Patient Visual Report: No change from baseline       Perception     Praxis      Pertinent Vitals/Pain Pain Assessment: Faces Faces Pain Scale: Hurts little more Pain Location: left hip Pain Descriptors / Indicators: Aching;Discomfort Pain Intervention(s): Ice applied;Repositioned;Monitored during session     Hand Dominance Right   Extremity/Trunk Assessment Upper Extremity Assessment Upper Extremity Assessment: RUE deficits/detail;LUE deficits/detail RUE Deficits / Details: tremulous RUE Coordination: decreased fine motor LUE Deficits / Details: tremulous, longstanding shoulder limitations LUE Coordination: decreased gross motor;decreased fine motor   Lower Extremity Assessment Lower Extremity Assessment: Defer to PT evaluation LLE Deficits / Details: hip flex 1/5, knee flex/ext 3/5, ankle WFL LLE Coordination: decreased fine motor;decreased gross motor   Cervical / Trunk Assessment Cervical / Trunk Assessment: Kyphotic (forward head, hx of cervical fx)  Communication Communication Communication: HOH   Cognition Arousal/Alertness: Awake/alert Behavior During Therapy: WFL for tasks assessed/performed Overall Cognitive Status: History of cognitive  impairments - at baseline                                 General Comments: STM issues   General Comments  HR 100 bpm when sitting after ambulation, O2 sats returned to 97% on RA. Pt given incentive spirometer and taught how to use.     Exercises    Shoulder Instructions      Home Living Family/patient expects to be discharged to:: Skilled nursing facility Living Arrangements: Spouse/significant other Available Help at Discharge: Family;Available PRN/intermittently Type of Home: House Home Access: Stairs to enter [employer] of Steps: 6 Entrance Stairs-Rails: Left Home Layout: Two level;Bed/bath upstairs Alternate Level Stairs-Number of Steps: 13 Alternate Level Stairs-Rails: Left Bathroom Shower/Tub: Teacher, early years/pre: Standard     Home Equipment: Cane - quad;Shower seat          Prior Functioning/Environment Level of Independence: Needs assistance  Gait / Transfers Assistance Needed: RW prn; per family, pt is quite sedentary ADL's / Homemaking Assistance Needed: drives, does grocery shopping and cooking, sometimes sits when showering            OT Problem List: Decreased strength;Decreased activity tolerance;Impaired balance (sitting and/or standing);Decreased coordination;Decreased cognition;Decreased safety awareness;Decreased knowledge of use of DME or AE;Pain      OT Treatment/Interventions: Self-care/ADL training;DME and/or AE instruction;Therapeutic activities;Patient/family education;Balance training    OT Goals(Current goals can be found in the care plan section) Acute Rehab OT Goals Patient Stated Goal: agreeable to rehab then home OT Goal Formulation: With patient Time For Goal Achievement: 11/08/16 Potential to Achieve Goals: Good ADL Goals Pt Will Perform Grooming: with supervision;standing Pt Will Perform Upper Body Dressing: with supervision;sitting Pt Will Perform Lower Body Dressing: with  supervision;with adaptive equipment;sit to/from stand Pt Will Transfer to Toilet: with supervision;ambulating;bedside commode Pt Will Perform Toileting - Clothing Manipulation and hygiene: with supervision;sit to/from stand  OT Frequency: Min 2X/week   Barriers to D/C: Decreased caregiver support  wife is out of the state       Co-evaluation              AM-PAC PT "6 Clicks" Daily Activity     Outcome Measure Help from another person eating meals?: A Little Help from another person taking care of personal grooming?: A Little Help from another person toileting, which includes using toliet, bedpan, or urinal?: A Little Help from another person bathing (including washing, rinsing, drying)?: A Lot Help from another person to put on and taking off regular upper body clothing?: A Little Help from another person to put on and taking off regular lower body clothing?: A Lot 6 Click Score: 16   End of Session Equipment Utilized During Treatment: Gait belt;Rolling walker  Activity Tolerance: Patient tolerated treatment well Patient left: in chair;with call bell/phone within reach;with family/visitor present  OT Visit Diagnosis: Other abnormalities of gait and mobility (R26.89);Unsteadiness on feet (R26.81);History of falling (Z91.81);Pain;Other symptoms and signs involving cognitive function Pain - Right/Left: Left Pain - part of body: Hip                Time: 8119-1478 OT Time Calculation (min): 23 min Charges:  OT General Charges $OT Visit: 1 Visit OT Evaluation $OT Eval Moderate Complexity: 1 Mod OT Treatments $Self Care/Home  Management : 8-22 mins G-Codes:     November 07, 2016 Nestor Lewandowsky, OTR/L Pager: [phone]  Werner Lean Haze Boyden 07-Nov-2016, 2:03 PM

## 2016-10-25 NOTE — Progress Notes (Signed)
   Subjective:  Patient reports pain as mild to moderate.  No c/o.   Objective:   VITALS:   Vitals:   10/25/16 0156 10/25/16 0514 10/25/16 1128 10/25/16 1415  BP: (!) 143/82 133/83 130/82 124/75  Pulse: 86 87 86 87  Resp: 16 16 16 16   Temp: 98.7 F (37.1 C) 98.5 F (36.9 C) 98.3 F (36.8 C) 98.5 F (36.9 C)  TempSrc: Oral Oral Oral Oral  SpO2: 96% 97% 96% 98%  Weight:      Height:        NAD ABD soft Sensation intact distally Intact pulses distally Dorsiflexion/Plantar flexion intact Incision: dressing C/D/I Compartment soft   Lab Results  Component Value Date   WBC 12.2 (H) 10/25/2016   HGB 8.3 (L) 10/25/2016   HCT 24.2 (L) 10/25/2016   MCV 90.3 10/25/2016   PLT 125 (L) 10/25/2016   BMET    Component Value Date/Time   NA 132 (L) 10/25/2016 0454   NA 138 06/04/2016   K 4.6 10/25/2016 0454   CL 94 (L) 10/25/2016 0454   CO2 28 10/25/2016 0454   GLUCOSE 142 (H) 10/25/2016 0454   BUN 11 10/25/2016 0454   BUN 16 06/04/2016   CREATININE 0.97 10/25/2016 0454   CREATININE 0.95 05/16/2015 0917   CALCIUM 8.0 (L) 10/25/2016 0454   GFRNONAA >60 10/25/2016 0454   GFRAA >60 10/25/2016 0454     Assessment/Plan: 1 Day Post-Op   Active Problems:   HLD (hyperlipidemia)   Alcohol abuse   Essential hypertension   ALCOHOLIC CIRRHOSIS OF LIVER   Hyponatremia   DCM (dilated cardiomyopathy) (HCC)   Chronic pain syndrome   ETOH abuse   Anemia   Closed displaced fracture of left femoral neck (HCC)   Hip fracture (HCC)   Displaced fracture of left femoral neck (HCC)   Thrombocytopenia (HCC)   Hypomagnesemia   WBAT with walker DVT ppx: ASA, SCDs, TEDs PO pain control PT/OT: slow progress, mod to max assist Dispo: SNF placement   Aziza Stuckert, Horald Pollen 10/25/2016, 3:21 PM   Rod Can, MD Cell [phone]

## 2016-10-25 NOTE — Care Management Note (Signed)
Case Management Note  Patient Details  Name: [name] MRN: [id number] Date of Birth: [birth year]  Subjective/Objective:                    Action/Plan:  Await PT/OT evals Expected Discharge Date:  10/27/16               Expected Discharge Plan:     In-House Referral:  Clinical Social Work  Discharge planning Services  CM Consult  Post Acute Care Choice:  Home Health, Durable Medical Equipment Choice offered to:     DME Arranged:    DME Agency:     HH Arranged:    [place] Agency:     Status of Service:  In process, will continue to follow  If discussed at Long Length of Stay Meetings, dates discussed:    Additional Comments:  Marilu Favre, RN 10/25/2016, 11:34 AM

## 2016-10-25 NOTE — Progress Notes (Signed)
PROGRESS NOTE    [name]  PPI:[id number] DOB: [birth year] DOA: 10/23/2016 PCP: Lucille Passy, MD   Brief Narrative:  [name] is a 71 y.o. male with medical history significant of ETOH abuse, Multiple fractures, HTN, GERD, HLD, Dementia and other comorbids who presented with fall tripped over roling chair a few times. He fell face first one time and with the next fall could not get up and landed on his left Hip and had hip pain and inability to bear weight. He drinks about 2 bottles of wine a day. X-Ray showed Femoral Neck Fracture and Orthopedic Surgery was consulted and patient under went Left Total Hip Arthoplasty from an Anterior Approach on 10/24/16.  Assessment & Plan:   Active Problems:   HLD (hyperlipidemia)   Alcohol abuse   Essential hypertension   ALCOHOLIC CIRRHOSIS OF LIVER   Hyponatremia   DCM (dilated cardiomyopathy) (HCC)   Chronic pain syndrome   ETOH abuse   Anemia   Closed displaced fracture of left femoral neck (HCC)   Hip fracture (HCC)   Displaced fracture of left femoral neck (HCC)   Thrombocytopenia (HCC)  Closed Displaced Left Femoral Neck Fracture s/p Left Total Hip Arthroplasty  Anterior Approach POD 1 -Management per Ortho; Patient went to Surgery yesterday -Head CT and Cervical Spine CT w/o Contrast showed No acute intracranial trauma. Atrophy and microvascular disease. No acute cervical spine fracture.Stable fracture of the dens with posterior fusion at C1 -C2. -Xray of Pelvis showed Left femoral neck fracture, see left femur comparison. No superimposed pelvis fracture -Lumbar X-ray showed No acute fracture or listhesis in the lumbar spine. Mild chronic L1 compression fracture. -CXR showed no No acute cardiopulmonary process. -At baseline patient is able to walk a flight of stairs or 100 feet -C/w Foley per Ortho -EKG showed no evidence of acute ischemia -Recent ECHO reassuring -Pain Control in PACU with 25-50 mcg IV q5hprn up to 150  mcg -C/w Hydrocodone 1-2 tab po q6hprn Moderate Pain -C/w Hydromorphone 0.5 mg IV q4hprn Severe Pain; Will stop IV Morphine -Bowel Regimen with Senna po BID, Miralax Daily -PT/OT to Evaluate and Treat; Possible D/C home with Home Health -Abx Prophylaxis per Ortho -Pharmacological Prophylaxis per Ortho  Alcoholic Liver Cirrhosis -AST was elevated at 80 and improved to 51 and ALT was 50 and improved to 29; INR was 1.11 -T Bili was slightly elevated at 1.6 -Ammonia was 20 -Patient continues to Drink Alcohol  -Currently INR albumin seems to be stable.  -Spoke about importance of discontinuing alcohol abuse  Alcohol Abuse/Concern for Withdrawal -Drinks a couple Bottles of Wine daily -Monitor for withdrawal order on CIWA protocol; Patient starting to show withdrawal symptoms and has treasures  -C/w Lorazepam IV -C/w Folic Acid 1 mg, MVI, Thiamine  Hypomagnesemia -Mag Level was 1.4 -Replete with 2 Grams of IV Mag Sulfate -Continue to Monitor and Replete as Necessary  -Repeat Mag Level in AM   Normocytic Anemia compounded by Acute Blood Loss Anemia from Surgery  -Patient's Hb/Hct went from 12.9/36.7 -> 11.9/34.4 -> 8.3/24.2 -Continue to Monitor for S/Sx of Bleeding -Repeat CBC in AM    Essential Hypertension -C/w Metoprolol Succinate 25 mg po Daily and Furosemide 40 mg po Daily   Hyponatremia -Slightly improved. Na+ went from 129 -> 132 -> 132 -Likely 2/2 to Alcohol Abuse and liver disease  -Obtain urine electrolytes -Continue to Monitor and Repeat CMP  History of Systolic CHF  -Last echogram showed preserved EF  -C/w BB  and Lasix as above -Currently appears to be hemodynamically stable  History of Ascites/Abdominal Distention  -Some abdominal distention noted but tympanic -AST trending down  -KUB showed Generous volume of stool and air throughout the colon. No evidence of bowel obstruction or perforation. No biliary or urinary calculi are  evident.  Thrombocytopenia -Mild. Platelet Count went from 123-125 -Likely secondary to Alcohol Abuse  DVT prophylaxis: SCDs'; Pharmacologic Prophylaxis per Ortho Code Status: FULL CODE Family Communication: No Family present at bedside Disposition Plan: Remain Inpatient; Likely SNF vs. Home Health   Consultants:   Orthopedic Surgery Dr. Lyla Glassing    Procedures:  Displaced Left Femoral Neck Fracture s/p Left Total Hip Arthroplasty  Anterior Approach   Antimicrobials:  Anti-infectives    Start     Dose/Rate Route Frequency Ordered Stop   10/25/16 0000  vancomycin (VANCOCIN) IVPB 1000 mg/200 mL premix     1,000 mg 200 mL/hr over 60 Minutes Intravenous Every 12 hours 10/24/16 1436 10/25/16 0200   10/24/16 1130  vancomycin (VANCOCIN) IVPB 1000 mg/200 mL premix     1,000 mg 200 mL/hr over 60 Minutes Intravenous  Once 10/24/16 1118 10/24/16 1249   10/24/16 0915  ceFAZolin (ANCEF) IVPB 2g/100 mL premix  Status:  Discontinued     2 g 200 mL/hr over 30 Minutes Intravenous On call to O.R. 10/24/16 0910 10/24/16 0917   10/24/16 0906  ceFAZolin (ANCEF) 2-4 GM/100ML-% IVPB    Comments:  Laurita Quint   : cabinet override      10/24/16 0906 10/24/16 1002   10/24/16 0600  ceFAZolin (ANCEF) IVPB 2g/100 mL premix     2 g 200 mL/hr over 30 Minutes Intravenous On call to O.R. 10/24/16 0439 10/24/16 1002     Subjective: Seen and at bedside and had some tremors and was more alert and awake. States he wanted to try to walk. No CP or SOB and states leg swelling is stable. No other concerns or complaints at this time.   Objective: Vitals:   10/24/16 2106 10/25/16 0156 10/25/16 0514 10/25/16 1128  BP: 118/82 (!) 143/82 133/83 130/82  Pulse: 90 86 87 86  Resp: 15 16 16 16   Temp: 98.4 F (36.9 C) 98.7 F (37.1 C) 98.5 F (36.9 C) 98.3 F (36.8 C)  TempSrc: Oral Oral Oral Oral  SpO2: 93% 96% 97% 96%  Weight:      Height:        Intake/Output Summary (Last 24 hours) at 10/25/16  1238 Last data filed at 10/25/16 2633  Gross per 24 hour  Intake          4496.09 ml  Output              600 ml  Net          3896.09 ml   Filed Weights   10/23/16 2017 10/24/16 1438  Weight: 81.6 kg (180 lb) 81.6 kg (180 lb)   Examination: Physical Exam:  Constitutional: WN/WD obese Caucasian Male in NAD sitting up in bed.  Eyes: Sclerae anicteric. Lids normal.  ENMT: External Ears and nose appear normal. MMM. Poor dentition Neck: Supple with no JVD Respiratory: CTAB; No appreciable wheezing/rales/rhonchi. Patient was no tachypenic or using any accessory muscles to breathe Cardiovascular: RRR; No m/r/g. Mild Extremity edema Abdomen: Soft, NT, Distended due to body habitus. Slightly hypertympanic GU: Deferred Musculoskeletal: No clubbing or cyanosis. No joint deformities Skin: Warm and Dry. Incisions appear C/D/I. No rashes or lesions on a limited skin evaluation Neurologic:  CN 2-12 grossly intact. Has bilateral Tremors. Mild Asterixis  Psychiatric: Normal mood and affect. Intact judgement and insight  Data Reviewed: I have personally reviewed following labs and imaging studies  CBC:  Recent Labs Lab 10/23/16 2014 10/24/16 0429 10/25/16 0454  WBC 5.0 8.4 12.2*  NEUTROABS 3.7  --  9.3*  HGB 12.9* 11.9* 8.3*  HCT 36.7* 34.4* 24.2*  MCV 86.8 87.5 90.3  PLT 123* 123* 660*   Basic Metabolic Panel:  Recent Labs Lab 10/23/16 2032 10/24/16 0429 10/25/16 0454  NA 129* 132* 132*  K 4.0 3.8 4.6  CL 88* 91* 94*  CO2 25 25 28   GLUCOSE 118* 112* 142*  BUN 7 5* 11  CREATININE 0.76 0.73 0.97  CALCIUM 8.6* 8.2* 8.0*  MG  --   --  1.4*  PHOS  --   --  3.2   GFR: Estimated Creatinine Clearance: 70.9 mL/min (by C-G formula based on SCr of 0.97 mg/dL). Liver Function Tests:  Recent Labs Lab 10/23/16 2032 10/25/16 0454  AST 80* 51*  ALT 50 29  ALKPHOS 103 67  BILITOT 0.9 1.6*  PROT 6.9 5.8*  ALBUMIN 4.0 3.6   No results for input(s): LIPASE, AMYLASE in the last  168 hours.  Recent Labs Lab 10/24/16 0500  AMMONIA 20   Coagulation Profile:  Recent Labs Lab 10/23/16 2014  INR 1.11   Cardiac Enzymes: No results for input(s): CKTOTAL, CKMB, CKMBINDEX, TROPONINI in the last 168 hours. BNP (last 3 results) No results for input(s): PROBNP in the last 8760 hours. HbA1C: No results for input(s): HGBA1C in the last 72 hours. CBG: No results for input(s): GLUCAP in the last 168 hours. Lipid Profile: No results for input(s): CHOL, HDL, LDLCALC, TRIG, CHOLHDL, LDLDIRECT in the last 72 hours. Thyroid Function Tests: No results for input(s): TSH, T4TOTAL, FREET4, T3FREE, THYROIDAB in the last 72 hours. Anemia Panel: No results for input(s): VITAMINB12, FOLATE, FERRITIN, TIBC, IRON, RETICCTPCT in the last 72 hours. Sepsis Labs: No results for input(s): PROCALCITON, LATICACIDVEN in the last 168 hours.  Recent Results (from the past 240 hour(s))  Surgical pcr screen     Status: Abnormal   Collection Time: 10/24/16  9:21 AM  Result Value Ref Range Status   MRSA, PCR POSITIVE (A) NEGATIVE Final    Comment: RESULT CALLED TO, READ BACK BY AND VERIFIED WITH: BAILEY RN AT 1115 ON [id number] BY SJW    Staphylococcus aureus POSITIVE (A) NEGATIVE Final    Comment: RESULT CALLED TO, READ BACK BY AND VERIFIED WITH: BAILEY RN AT 1115 ON [id number] BY SJW (NOTE) The Xpert SA Assay (FDA approved for NASAL specimens in patients 16 years of age and older), is one component of a comprehensive surveillance program. It is not intended to diagnose infection nor to guide or monitor treatment.     Radiology Studies: Dg Chest 1 View  Result Date: 10/23/2016 CLINICAL DATA:  FALL today- pt fell while trying to sit in a rolling chair. Left hip pain. Hx left shoulder surgery. EXAM: CHEST 1 VIEW COMPARISON:  05/20/2016 FINDINGS: Normal cardiac silhouette. Lungs are clear. No effusion, infiltrate pneumothorax. Remote LEFT rib fractures noted. IMPRESSION: No acute  cardiopulmonary process. Electronically Signed   By: Suzy Bouchard M.D.   On: 10/23/2016 21:07   Dg Lumbar Spine 2-3 Views  Result Date: 10/23/2016 CLINICAL DATA:  70 year old male status post fall today with pain radiating to the left hip. EXAM: LUMBAR SPINE - 2-3 VIEW COMPARISON:  CT Abdomen and  Pelvis 05/18/2016. FINDINGS: Normal lumbar segmentation. Stable mild L1 compression fracture. Other lumbar vertebral height and alignment is preserved. Vacuum disc at L3-L4. T12 level appears intact. Grossly intact visible sacrum. Calcified atherosclerosis in the abdomen. IMPRESSION: 1.  No acute fracture or listhesis in the lumbar spine. 2. Mild chronic L1 compression fracture. Electronically Signed   By: Genevie Ann M.D.   On: 10/23/2016 21:11   Dg Pelvis 1-2 Views  Result Date: 10/23/2016 CLINICAL DATA:  71 year old male status post fall today with left hip pain. EXAM: PELVIS - 1-2 VIEW COMPARISON:  Left femur series today reported separately. CT Abdomen and Pelvis 05/18/2016. FINDINGS: Left femoral neck fracture re- demonstrated. Femoral heads remain normally located. Proximal right femur appears intact. Intact pelvis. Calcified iliofemoral atherosclerosis. Negative visible bowel gas pattern. IMPRESSION: 1. Left femoral neck fracture, see left femur comparison. 2. No superimposed pelvis fracture. Electronically Signed   By: Genevie Ann M.D.   On: 10/23/2016 21:09   Dg Abd 1 View  Result Date: 10/24/2016 CLINICAL DATA:  Abdominal distention EXAM: ABDOMEN - 1 VIEW COMPARISON:  05/18/2016 FINDINGS: Generous volume of stool and air throughout the colon. No evidence of bowel obstruction or perforation. No biliary or urinary calculi are evident. IMPRESSION: Negative for bowel obstruction or perforation. Electronically Signed   By: Andreas Newport M.D.   On: 10/24/2016 03:57   Ct Head Wo Contrast  Result Date: 10/23/2016 CLINICAL DATA:  C-spine trauma, high clinical risk (NEXUS/CCR) Head trauma, minor,  GCS>=13, high clinical risk, initial exam. Pt brought in by EMS due to falling while trying to sit in rolling chair. EXAM: CT HEAD WITHOUT CONTRAST CT CERVICAL SPINE WITHOUT CONTRAST TECHNIQUE: Multidetector CT imaging of the head and cervical spine was performed following the standard protocol without intravenous contrast. Multiplanar CT image reconstructions of the cervical spine were also generated. COMPARISON:  None. FINDINGS: CT HEAD FINDINGS Brain: No intracranial hemorrhage. No parenchymal contusion. No midline shift or mass effect. Basilar cisterns are patent. No skull base fracture. No fluid in the paranasal sinuses or mastoid air cells. Orbits are normal. There are periventricular and subcortical white matter hypodensities. Generalized cortical atrophy. Vascular: No hyperdense vessel or unexpected calcification. Skull: Normal. Negative for fracture or focal lesion. Sinuses/Orbits: Paranasal sinuses and mastoid air cells are clear. Orbits are clear. Other: None. CT CERVICAL SPINE FINDINGS Alignment: Normal alignment. Skull base and vertebrae: Remote fracture of the dense without subluxation. Cerclage wire posteriorly at C1-C2. Bilateral pedicle screws at C1-C2. No evidence acute fracture dislocation. Soft tissues and spinal canal: No prevertebral fluid or swelling. No visible canal hematoma. Disc levels: Mild disc space narrowing in the lower cervical spine with osteophytosis Upper chest: Clear Other: None IMPRESSION: 1. No acute intracranial trauma. 2. Atrophy and microvascular disease. 3. No acute cervical spine fracture. 4. Stable fracture of the dens with posterior fusion at C1 -C2. Electronically Signed   By: Suzy Bouchard M.D.   On: 10/23/2016 21:31   Ct Cervical Spine Wo Contrast  Result Date: 10/23/2016 CLINICAL DATA:  C-spine trauma, high clinical risk (NEXUS/CCR) Head trauma, minor, GCS>=13, high clinical risk, initial exam. Pt brought in by EMS due to falling while trying to sit in  rolling chair. EXAM: CT HEAD WITHOUT CONTRAST CT CERVICAL SPINE WITHOUT CONTRAST TECHNIQUE: Multidetector CT imaging of the head and cervical spine was performed following the standard protocol without intravenous contrast. Multiplanar CT image reconstructions of the cervical spine were also generated. COMPARISON:  None. FINDINGS: CT HEAD FINDINGS Brain: No  intracranial hemorrhage. No parenchymal contusion. No midline shift or mass effect. Basilar cisterns are patent. No skull base fracture. No fluid in the paranasal sinuses or mastoid air cells. Orbits are normal. There are periventricular and subcortical white matter hypodensities. Generalized cortical atrophy. Vascular: No hyperdense vessel or unexpected calcification. Skull: Normal. Negative for fracture or focal lesion. Sinuses/Orbits: Paranasal sinuses and mastoid air cells are clear. Orbits are clear. Other: None. CT CERVICAL SPINE FINDINGS Alignment: Normal alignment. Skull base and vertebrae: Remote fracture of the dense without subluxation. Cerclage wire posteriorly at C1-C2. Bilateral pedicle screws at C1-C2. No evidence acute fracture dislocation. Soft tissues and spinal canal: No prevertebral fluid or swelling. No visible canal hematoma. Disc levels: Mild disc space narrowing in the lower cervical spine with osteophytosis Upper chest: Clear Other: None IMPRESSION: 1. No acute intracranial trauma. 2. Atrophy and microvascular disease. 3. No acute cervical spine fracture. 4. Stable fracture of the dens with posterior fusion at C1 -C2. Electronically Signed   By: Suzy Bouchard M.D.   On: 10/23/2016 21:31   Pelvis Portable  Result Date: 10/24/2016 CLINICAL DATA:  Status post left hip replacement today for a femoral neck fracture suffered in a fall 10/23/2016. Initial encounter. EXAM: PORTABLE PELVIS 1-2 VIEWS COMPARISON:  Plain films present single-view of the pelvis 10/23/2016. FINDINGS: New left total hip arthroplasty is in place. The device is  located. No fracture or other acute bony abnormality. Atherosclerosis noted. IMPRESSION: Status post left hip replacement.  No acute abnormality. Electronically Signed   By: Inge Rise M.D.   On: 10/24/2016 13:31   Dg C-arm 61-120 Min  Result Date: 10/24/2016 CLINICAL DATA:  Left hip arthroplasty. EXAM: DG C-ARM 61-120 MIN; OPERATIVE LEFT HIP WITH PELVIS COMPARISON:  10/23/2016 FINDINGS: Intraoperative images demonstrate normal alignment in the frontal projection of a total left hip arthroplasty. IMPRESSION: Normal alignment of left hip arthroplasty. Electronically Signed   By: Aletta Edouard M.D.   On: 10/24/2016 12:24   Dg Hip Operative Unilat W Or W/o Pelvis Left  Result Date: 10/24/2016 CLINICAL DATA:  Left hip arthroplasty. EXAM: DG C-ARM 61-120 MIN; OPERATIVE LEFT HIP WITH PELVIS COMPARISON:  10/23/2016 FINDINGS: Intraoperative images demonstrate normal alignment in the frontal projection of a total left hip arthroplasty. IMPRESSION: Normal alignment of left hip arthroplasty. Electronically Signed   By: Aletta Edouard M.D.   On: 10/24/2016 12:24   Dg Femur Min 2 Views Left  Result Date: 10/23/2016 CLINICAL DATA:  71 year old male status post fall today with left hip pain. EXAM: LEFT FEMUR 2 VIEWS COMPARISON:  CT Abdomen and Pelvis 05/18/2016. FINDINGS: Left femoral neck fracture with varus impaction (arrows). The intertrochanteric segment of the left femur appears intact. Left femoral head remains normally located. Grossly intact visible left hemipelvis. The more distal left femur appears intact. Preserved alignment at the left knee. Calcified peripheral vascular disease. Medial compartment knee joint degeneration. Possible small knee joint effusion. IMPRESSION: 1. Left femoral neck fracture with varus impaction. 2. No other left femur fracture identified. 3. Possible left knee joint effusion. 4. Calcified peripheral vascular disease. Electronically Signed   By: Genevie Ann M.D.   On:  10/23/2016 21:08   Scheduled Meds: . aspirin EC  81 mg Oral BID WC  . docusate sodium  100 mg Oral BID  . folic acid  1 mg Oral Daily  . furosemide  40 mg Oral Daily  . Influenza vac split quadrivalent PF  0.5 mL Intramuscular Tomorrow-1000  . loratadine  10 mg  Oral Daily  . LORazepam  0-4 mg Intravenous Q6H   Or  . LORazepam  0-4 mg Oral Q6H  . [START ON 10/26/2016] LORazepam  0-4 mg Intravenous Q12H   Or  . [START ON 10/26/2016] LORazepam  0-4 mg Oral Q12H  . metoprolol succinate  25 mg Oral Daily  . multivitamin with minerals  1 tablet Oral Daily  . polyethylene glycol  17 g Oral Daily  . predniSONE  5 mg Oral Q breakfast  . QUEtiapine  300 mg Oral QHS  . senna  1 tablet Oral BID  . thiamine  100 mg Oral Daily  . traZODone  50 mg Oral QHS  . vitamin B-12  100 mcg Oral Daily   Continuous Infusions: . sodium chloride 125 mL/hr at 10/25/16 0106  . lactated ringers 10 mL/hr at 10/24/16 0921  . methocarbamol (ROBAXIN)  IV Stopped (10/24/16 2215)    LOS: 1 day   Kerney Elbe, DO [facility] Pager [phone]  If 7PM-7AM, please contact night-coverage [email or web address] Password [facility] 10/25/2016, 12:38 PM

## 2016-10-25 NOTE — Evaluation (Signed)
Physical Therapy Evaluation Patient Details Name: [name] MRN: [id number] DOB: [birth year] Today's Date: 10/25/2016   History of Present Illness  [name] a 71 y.o.malewith medical history significant of ETOH abuse, Multiple remote fractures, HTN, GERD, HLD, Dementia and other comorbids who presented with fall over rolling chair face first. Thenwith the next fall could not get up and landed on his left Hip and had hip pain and inability to bear weight. He drinks about 2 bottles of wine a day. Left femur fx, underwent left direct anterior THA  Clinical Impression  Pt admitted with alcoholism related fall and subsequent hip fx. Pt currently with functional limitations due to the deficits listed below (see PT Problem List). Pt currently limited by pain and weakness associated with THA as well as symptoms of withdrawal including tremors. Pt ambulated 6' with RW and min A. Required max A to get to EOB.  Pt will benefit from skilled PT to increase their independence and safety with mobility to allow discharge to the venue listed below.    HR 100  O2 sats 89% on RA     Follow Up Recommendations SNF;Supervision/Assistance - 24 hour    Equipment Recommendations  Rolling walker with 5" wheels    Recommendations for Other Services       Precautions / Restrictions Precautions Precautions: Fall Restrictions Weight Bearing Restrictions: Yes LLE Weight Bearing: Weight bearing as tolerated      Mobility  Bed Mobility Overal bed mobility: Needs Assistance Bed Mobility: Rolling;Sidelying to Sit Rolling: Mod assist Sidelying to sit: Max assist       General bed mobility comments: cues for rolling first as pt has large ventral hernia. Went to R, pt able to grasp rail for assist with rolling. Mod A to complete roll but max A for hips to EOB and trunk into upright position  Transfers Overall transfer level: Needs assistance Equipment used: Rolling walker (2 wheeled) Transfers:  Sit to/from Stand Sit to Stand: Min assist         General transfer comment: min A to steady and vc's for hand placement. Pt with tremor from EtOH withdrawal  Ambulation/Gait Ambulation/Gait assistance: Min assist Ambulation Distance (Feet): 6 Feet Assistive device: Rolling walker (2 wheeled) Gait Pattern/deviations: Step-to pattern;Shuffle Gait velocity: decreased Gait velocity interpretation: <1.8 ft/sec, indicative of risk for recurrent falls General Gait Details: very small, shuffling steps. Pt fatigued very quickly and felt nauseous, chair brought up behind. O2 sats dropped to 89% on RA from 97% on 2L. O2 reapplied once sitting  Stairs            Wheelchair Mobility    Modified Rankin (Stroke Patients Only)       Balance Overall balance assessment: Needs assistance;History of Falls Sitting-balance support: Feet supported;Single extremity supported Sitting balance-Leahy Scale: Fair     Standing balance support: Bilateral upper extremity supported Standing balance-Leahy Scale: Poor Standing balance comment: unable to let go of RW in standing                             Pertinent Vitals/Pain Pain Assessment: Faces Faces Pain Scale: Hurts even more Pain Location: left hip Pain Descriptors / Indicators: Aching;Discomfort Pain Intervention(s): Limited activity within patient's tolerance;Monitored during session;Premedicated before session    Home Living Family/patient expects to be discharged to:: Skilled nursing facility Living Arrangements: Spouse/significant other Available Help at Discharge: Family;Available PRN/intermittently Type of Home: House Home Access: Stairs to  enter Entrance Stairs-Rails: Left Entrance Stairs-Number of Steps: 6 Home Layout: Two level;Bed/bath upstairs Home Equipment: Cane - quad;Shower seat      Prior Function Level of Independence: Needs assistance   Gait / Transfers Assistance Needed: RW prn; per family, pt is  quite sedentary  ADL's / Homemaking Assistance Needed: pt sometimes has trouble getting upstairs. He has had numerous falls with fractures. His wife is currently out of town for a week, sister in Sports coach here with him        Hand Dominance   Dominant Hand: Right    Extremity/Trunk Assessment   Upper Extremity Assessment Upper Extremity Assessment: Defer to OT evaluation    Lower Extremity Assessment Lower Extremity Assessment: LLE deficits/detail LLE Deficits / Details: hip flex 1/5, knee flex/ext 3/5, ankle WFL LLE Coordination: decreased fine motor;decreased gross motor    Cervical / Trunk Assessment Cervical / Trunk Assessment: Kyphotic  Communication   Communication: HOH  Cognition Arousal/Alertness: Awake/alert Behavior During Therapy: WFL for tasks assessed/performed Overall Cognitive Status: History of cognitive impairments - at baseline                                 General Comments: STM issues      General Comments General comments (skin integrity, edema, etc.): HR 100 bpm when sitting after ambulation, O2 sats returned to 97% on RA. Pt given incentive spirometer and taught how to use.     Exercises Total Joint Exercises Ankle Circles/Pumps: AROM;Both;20 reps;Seated Quad Sets: AROM;Both;10 reps;Seated Heel Slides: AAROM;Left;5 reps;Supine Hip ABduction/ADduction: AAROM;Left;10 reps;Seated   Assessment/Plan    PT Assessment Patient needs continued PT services  PT Problem List Decreased strength;Decreased range of motion;Decreased activity tolerance;Decreased balance;Decreased mobility;Decreased coordination;Decreased cognition;Decreased knowledge of use of DME;Decreased safety awareness;Decreased knowledge of precautions;Pain       PT Treatment Interventions DME instruction;Gait training;Functional mobility training;Therapeutic activities;Therapeutic exercise;Balance training;Patient/family education    PT Goals (Current goals can be found in  the Care Plan section)  Acute Rehab PT Goals Patient Stated Goal: agreeable to rehab then home PT Goal Formulation: With patient Time For Goal Achievement: 11/01/16 Potential to Achieve Goals: Good    Frequency Min 5X/week   Barriers to discharge Decreased caregiver support wife works    Co-evaluation               AM-PAC PT "6 Clicks" Daily Activity  Outcome Measure Difficulty turning over in bed (including adjusting bedclothes, sheets and blankets)?: Unable Difficulty moving from lying on back to sitting on the side of the bed? : Unable Difficulty sitting down on and standing up from a chair with arms (e.g., wheelchair, bedside commode, etc,.)?: Unable Help needed moving to and from a bed to chair (including a wheelchair)?: A Lot Help needed walking in hospital room?: A Lot Help needed climbing 3-5 steps with a railing? : Total 6 Click Score: 8    End of Session Equipment Utilized During Treatment: Gait belt Activity Tolerance: Patient limited by pain;Other (comment) (limited by nausea) Patient left: in chair;with call bell/phone within reach;with family/visitor present Nurse Communication: Mobility status PT Visit Diagnosis: Unsteadiness on feet (R26.81);Repeated falls (R29.6);History of falling (Z91.81);Pain;Difficulty in walking, not elsewhere classified (R26.2);Other symptoms and signs involving the nervous system (R29.898) Pain - Right/Left: Left Pain - part of body: Hip    Time: 4650-3546 PT Time Calculation (min) (ACUTE ONLY): 56 min   Charges:   PT Evaluation $PT Eval Moderate  Complexity: 1 Mod PT Treatments $Gait Training: 8-22 mins $Therapeutic Exercise: 8-22 mins $Therapeutic Activity: 8-22 mins   PT G Codes:        Leighton Roach, PT  Acute Rehab Services  [place] 10/25/2016, 12:56 PM

## 2016-10-26 ENCOUNTER — Inpatient Hospital Stay (HOSPITAL_COMMUNITY): Payer: Medicare Other

## 2016-10-26 DIAGNOSIS — F10232 Alcohol dependence with withdrawal with perceptual disturbance: Secondary | ICD-10-CM

## 2016-10-26 DIAGNOSIS — D500 Iron deficiency anemia secondary to blood loss (chronic): Secondary | ICD-10-CM

## 2016-10-26 DIAGNOSIS — D538 Other specified nutritional anemias: Secondary | ICD-10-CM

## 2016-10-26 LAB — COMPREHENSIVE METABOLIC PANEL
ALT: 26 U/L (ref 17–63)
ANION GAP: 9 (ref 5–15)
AST: 45 U/L — ABNORMAL HIGH (ref 15–41)
Albumin: 3.2 g/dL — ABNORMAL LOW (ref 3.5–5.0)
Alkaline Phosphatase: 62 U/L (ref 38–126)
BUN: 12 mg/dL (ref 6–20)
CALCIUM: 7.9 mg/dL — AB (ref 8.9–10.3)
CHLORIDE: 97 mmol/L — AB (ref 101–111)
CO2: 27 mmol/L (ref 22–32)
Creatinine, Ser: 1.04 mg/dL (ref 0.61–1.24)
GFR calc non Af Amer: >60 mL/min (ref >60)
Glucose, Bld: 110 mg/dL — ABNORMAL HIGH (ref 65–99)
Potassium: 2.9 mmol/L — ABNORMAL LOW (ref 3.5–5.1)
SODIUM: 133 mmol/L — AB (ref 135–145)
Total Bilirubin: 1.7 mg/dL — ABNORMAL HIGH (ref 0.3–1.2)
Total Protein: 5.4 g/dL — ABNORMAL LOW (ref 6.5–8.1)

## 2016-10-26 LAB — CBC WITH DIFFERENTIAL/PLATELET
BASOS PCT: 0 %
Basophils Absolute: 0 10*3/uL (ref 0.0–0.1)
EOS PCT: 0 %
Eosinophils Absolute: 0 10*3/uL (ref 0.0–0.7)
HCT: 18 % — ABNORMAL LOW (ref 39.0–52.0)
Hemoglobin: 6.2 g/dL — CL (ref 13.0–17.0)
LYMPHS ABS: 1.1 10*3/uL (ref 0.7–4.0)
Lymphocytes Relative: 19 %
MCH: 31.3 pg (ref 26.0–34.0)
MCHC: 34.4 g/dL (ref 30.0–36.0)
MCV: 90.9 fL (ref 78.0–100.0)
MONO ABS: 0.9 10*3/uL (ref 0.1–1.0)
Monocytes Relative: 16 %
Neutro Abs: 3.7 10*3/uL (ref 1.7–7.7)
Neutrophils Relative %: 65 %
PLATELETS: 92 10*3/uL — AB (ref 150–400)
RBC: 1.98 MIL/uL — ABNORMAL LOW (ref 4.22–5.81)
RDW: 16.9 % — ABNORMAL HIGH (ref 11.5–15.5)
WBC: 5.7 10*3/uL (ref 4.0–10.5)

## 2016-10-26 LAB — PREPARE RBC (CROSSMATCH)

## 2016-10-26 LAB — PHOSPHORUS: PHOSPHORUS: 2.3 mg/dL — AB (ref 2.5–4.6)

## 2016-10-26 LAB — MAGNESIUM: Magnesium: 1.8 mg/dL (ref 1.7–2.4)

## 2016-10-26 MED ORDER — FUROSEMIDE 10 MG/ML IJ SOLN: 20.0000 mg | Freq: Once | INTRAMUSCULAR | Status: DC

## 2016-10-26 MED ORDER — POTASSIUM CHLORIDE CRYS ER 20 MEQ PO TBCR
40.0000 meq | EXTENDED_RELEASE_TABLET | Freq: Two times a day (BID) | ORAL | Status: DC
  Administered 2016-10-26 – 2016-10-27 (×3): 40 meq via ORAL
  Filled 2016-10-26 (×3): qty 2

## 2016-10-26 MED ORDER — SODIUM CHLORIDE 0.9 % IV SOLN
Freq: Once | INTRAVENOUS | Status: AC
  Administered 2016-10-26: 15:00:00 via INTRAVENOUS

## 2016-10-26 MED ORDER — POTASSIUM CHLORIDE 10 MEQ/100ML IV SOLN
10.0000 meq | INTRAVENOUS | Status: AC
  Administered 2016-10-26 (×3): 10 meq via INTRAVENOUS
  Filled 2016-10-26 (×3): qty 100

## 2016-10-26 MED ORDER — LORAZEPAM 2 MG/ML IJ SOLN
2.0000 mg | INTRAMUSCULAR | Status: DC | PRN
  Administered 2016-10-26: 2 mg via INTRAVENOUS
  Filled 2016-10-26 (×2): qty 1

## 2016-10-26 NOTE — Progress Notes (Signed)
Pt for transfer to [facility] in room 25 report given to [place], family at the bedside.

## 2016-10-26 NOTE — Progress Notes (Signed)
Pt transfer to ultrasound -renal.

## 2016-10-26 NOTE — Progress Notes (Signed)
This note also relates to the following rows which could not be included: Rate - Cannot attach notes to extension rows Line - Cannot attach notes to extension rows  Air in line, line changed, transfusion started at [facility]

## 2016-10-26 NOTE — Progress Notes (Signed)
Orthopedic Tech Progress Note Patient Details:  [name] [birth year] [id number]  Patient ID: [name], male   DOB: [birth year], 71 y.o.   MRN: [id number] Pt cant have ohf due to age restrictions  [name] 10/26/2016, 11:29 PM

## 2016-10-26 NOTE — Progress Notes (Signed)
CRITICAL VALUE ALERT  Critical Value:  hgb 6.2 Date & Time Notied:  10/26/16 at 0900 Provider Notified:Dr. Alfredia Ferguson Orders Received/Actions taken: 2 units of prbc

## 2016-10-26 NOTE — Progress Notes (Signed)
Pt potassium was 2.9 given 3 bags of potassium IVPB, hgb 6.2 MD ordered 2 units of PRBC given 1x started 1500 verified by another RN with consent, pt anxious trying to pull lines given ativan, Dr. Alfredia Ferguson ordered to transfer the pt to step down for close monitoring- rm 25 waiting for Estill Bamberg to give me a call, charge nurse aware.

## 2016-10-26 NOTE — Anesthesia Postprocedure Evaluation (Signed)
Anesthesia Post Note  Patient: [name]  Procedure(s) Performed: TOTAL HIP ARTHROPLASTY ANTERIOR APPROACH (Left Hip)     Patient location during evaluation: PACU Anesthesia Type: General Level of consciousness: awake and alert Pain management: pain level controlled Vital Signs Assessment: post-procedure vital signs reviewed and stable Respiratory status: spontaneous breathing, nonlabored ventilation, respiratory function stable and patient connected to nasal cannula oxygen Cardiovascular status: blood pressure returned to baseline and stable Postop Assessment: no apparent nausea or vomiting Anesthetic complications: no    Last Vitals:  Vitals:   10/26/16 0109 10/26/16 0638  BP:  124/88  Pulse: (!) 107 88  Resp:  17  Temp:  37.1 C  SpO2:  99%    Last Pain:  Vitals:   10/25/16 2044  TempSrc: Oral  PainSc:                  [name]

## 2016-10-26 NOTE — Progress Notes (Signed)
PT Cancellation Note  Patient Details Name: [name] MRN: [id number] DOB: [birth year]   Cancelled Treatment:    Reason Eval/Treat Not Completed: Medical issues which prohibited therapy (pt with Hgb 6.2 and not medically appropriate at this time)   [place] 10/26/2016, 8:30 AM  Elwyn Reach, [place]

## 2016-10-26 NOTE — Progress Notes (Signed)
PROGRESS NOTE    [name]  BDZ:[id number] DOB: 07-14-1945 DOA: 10/23/2016 PCP: Lucille Passy, MD   Brief Narrative:  [name] is a 71 y.o. male with medical history significant of ETOH abuse, Multiple fractures, HTN, GERD, HLD, Dementia and other comorbids who presented with fall tripped over roling chair a few times. He fell face first one time and with the next fall could not get up and landed on his left Hip and had hip pain and inability to bear weight. He drinks about 2 bottles of wine a day. X-Ray showed Femoral Neck Fracture and Orthopedic Surgery was consulted and patient under went Left Total Hip Arthoplasty from an Anterior Approach on 10/24/16. Patient was confused and actively withdrawing so was transferred to SDU and had CIWA Protocol adjusted. He was also found to be Hypokalemic and Anemic so was given repletion and transfused 2 units of blood.   Assessment & Plan:   Active Problems:   HLD (hyperlipidemia)   Alcohol abuse   Essential hypertension   ALCOHOLIC CIRRHOSIS OF LIVER   Hyponatremia   DCM (dilated cardiomyopathy) (HCC)   Chronic pain syndrome   ETOH abuse   Anemia   Closed displaced fracture of left femoral neck (HCC)   Hip fracture (HCC)   Displaced fracture of left femoral neck (HCC)   Thrombocytopenia (HCC)   Hypomagnesemia  Closed Displaced Left Femoral Neck Fracture s/p Left Total Hip Arthroplasty  Anterior Approach POD 2 -Management per Ortho; Patient went to Surgery yesterday -Head CT and Cervical Spine CT w/o Contrast showed No acute intracranial trauma. Atrophy and microvascular disease. No acute cervical spine fracture.Stable fracture of the dens with posterior fusion at C1 -C2. -Xray of Pelvis showed Left femoral neck fracture, see left femur comparison. No superimposed pelvis fracture -Lumbar X-ray showed No acute fracture or listhesis in the lumbar spine. Mild chronic L1 compression fracture. -CXR showed no No acute cardiopulmonary  process. -At baseline patient is able to walk a flight of stairs or 100 feet -C/w Foley per Ortho -EKG showed no evidence of acute ischemia -Recent ECHO reassuring -Pain Control in PACU with 25-50 mcg IV q5hprn up to 150 mcg -C/w Hydrocodone 1-2 tab po q6hprn Moderate Pain -C/w Hydromorphone 0.5 mg IV q4hprn Severe Pain; Will stop IV Morphine -Bowel Regimen with Senna po BID, Miralax Daily -PT/OT to Evaluate and Treat; Possible D/C home with Home Health -Abx Prophylaxis per Ortho -Pharmacological Prophylaxis per Ortho  Alcoholic Liver Cirrhosis -AST was elevated at 80 and improved to 51 and ALT was 50 and improved to 29; INR was 1.11 -T Bili was slightly elevated at 1.7 -Ammonia was 20 -Patient continues to Drink Alcohol and likely going into withdrawal -Currently INR albumin seems to be stable.  -Spoke about importance of discontinuing alcohol abuse  Alcohol Abuse/Concern for Withdrawal -Drinks a couple Bottles of Wine daily -Monitor for withdrawal order on CIWA protocol; Patient starting to show withdrawal symptoms and had some tremors yesterday but today was completely out of it and hallucinating -Floor CIWA protocol discontinued and patient placed on SDU CIWA with 2-3 mg IV Ativan q1hprn as patient was transferred to SDU -C/w Folic Acid 1 mg, MVI, Thiamine  Hypomagnesemia -Mag Level was 1.4 and improved to 1.8 -Replete with 2 Grams of IV Mag Sulfate -Continue to Monitor and Replete as Necessary  -Repeat Mag Level in AM   Hypophosphatemia -Patient's Phos was 2.3 -Replete with IV NaPhos 20 mmol -Continue to Monitor and Replete as Necessary -  Repeat Phos Level in AM  Hypokalemia -Patient's K+ was 2.9 -Replete with IV KCl 30 mEQ and po KCl 40 mEQ BID -Continue to Monitor and Replete as Necessary -Repeat CMP in AM  Normocytic Anemia compounded by Acute Blood Loss Anemia from Surgery  -Patient's Hb/Hct went from 12.9/36.7 -> 11.9/34.4 -> 8.3/24.2 -> 6.2/18.0 -Transfuse 2  units of pRBC's with IV 20 mg Lasix in between  -Check FOBT  -Checked Retroperitoneal U/S for any Hematoma; Showed No perinephric fluid collections or free fluid collections are observed. No hydronephrosis. The right kidney is smaller than the left but this is a chronic finding. There is no evidence of obstruction or acute parenchymal abnormalities. Simple appearing lower pole cyst on the left. -Continue to Monitor for S/Sx of Bleeding  -Repeat CBC in AM    Essential Hypertension -C/w Metoprolol Succinate 25 mg po Daily and Furosemide 40 mg po Daily   Hyponatremia -Slightly improved. Na+ went from 129 -> 132 -> 132 -> 133 -Likely 2/2 to Alcohol Abuse and liver disease  -Obtain urine electrolytes -Continue to Monitor and Repeat CMP  History of Systolic CHF  -Last echogram showed preserved EF  -C/w BB and Lasix as above -Currently appears to be hemodynamically stable  History of Ascites/Abdominal Distention  -Some abdominal distention noted but tympanic -May get Abdominal U/S to evaluate and if shows significant Ascites would get a Paracentesis.  -AST trending down and now 41 -T Bili went up to 1.7  -KUB showed Generous volume of stool and air throughout the colon. No evidence of bowel obstruction or perforation. No biliary or urinary calculi are evident.  Thrombocytopenia -Mild. Platelet Count went from 125 -> 92 -Likely secondary to Alcohol Abuse  DVT prophylaxis: SCDs'; Pharmacologic Prophylaxis per Ortho Code Status: FULL CODE Family Communication: No Family present at bedside Disposition Plan: Remain Inpatient; Likely SNF vs. Home Health   Consultants:   Orthopedic Surgery Dr. Lyla Glassing    Procedures:  Displaced Left Femoral Neck Fracture s/p Left Total Hip Arthroplasty  Anterior Approach   Antimicrobials:  Anti-infectives    Start     Dose/Rate Route Frequency Ordered Stop   10/25/16 0000  vancomycin (VANCOCIN) IVPB 1000 mg/200 mL premix     1,000 mg 200 mL/hr over  60 Minutes Intravenous Every 12 hours 10/24/16 1436 10/25/16 0200   10/24/16 1130  vancomycin (VANCOCIN) IVPB 1000 mg/200 mL premix     1,000 mg 200 mL/hr over 60 Minutes Intravenous  Once 10/24/16 1118 10/24/16 1249   10/24/16 0915  ceFAZolin (ANCEF) IVPB 2g/100 mL premix  Status:  Discontinued     2 g 200 mL/hr over 30 Minutes Intravenous On call to O.R. 10/24/16 0910 10/24/16 0917   10/24/16 0906  ceFAZolin (ANCEF) 2-4 GM/100ML-% IVPB    Comments:  Laurita Quint   : cabinet override      10/24/16 0906 10/24/16 1002   10/24/16 0600  ceFAZolin (ANCEF) IVPB 2g/100 mL premix     2 g 200 mL/hr over 30 Minutes Intravenous On call to O.R. 10/24/16 0439 10/24/16 1002     Subjective: Seen and at bedside and was encephalopathic and hallucinating and reaching for things that weren't there and talking to people that weren't in the room. Was not diaphoretic but was withdrawing. Family states he started getting worse over night.   Objective: Vitals:   10/26/16 1425 10/26/16 1500 10/26/16 1651 10/26/16 1728  BP: (!) 129/59 121/70 (!) 154/80 115/88  Pulse: 91 80  94  Resp: [name])  30 20 (!) 21   Temp: 98.7 F (37.1 C)  98.6 F (37 C) 97.9 F (36.6 C)  TempSrc: Oral  Oral Oral  SpO2: 99% 98% 100%   Weight:      Height:        Intake/Output Summary (Last 24 hours) at 10/26/16 1838 Last data filed at 10/26/16 1728  Gross per 24 hour  Intake          4291.34 ml  Output              850 ml  Net          3441.34 ml   Filed Weights   10/23/16 2017 10/24/16 1438  Weight: 81.6 kg (180 lb) 81.6 kg (180 lb)   Examination: Physical Exam:  Constitutional: Confused and agigtated WN/WD obese Caucasian male who appears to be in [place].  Eyes: Sclerae anicteric. Lids normal ENMT: External Ears and nose appear normal Neck: Supple with no JVD Respiratory: Slightly tachypenic and has diminished breath sounds. No wheezing/rales/rhonchi Cardiovascular: Slightly tachycardic. No m/r/g. Mild LE  Edema Abdomen: Soft, NT, Distended due to body habitus and has diathesis and some fluid shift. Bowel sounds present  GU: Deferred Musculoskeletal: No contractures; No cyanosis Skin: Warm and dry. Mild LE swelling. No rashes or lesions.  Neurologic: CN 2-12 grossly intact. Has some tremors Psychiatric: Impaired judgment and insight. Not alert. Appears confused and somnolent.    Data Reviewed: I have personally reviewed following labs and imaging studies  CBC:  Recent Labs Lab 10/23/16 2014 10/24/16 0429 10/25/16 0454 10/26/16 0657  WBC 5.0 8.4 12.2* 5.7  NEUTROABS 3.7  --  9.3* 3.7  HGB 12.9* 11.9* 8.3* 6.2*  HCT 36.7* 34.4* 24.2* 18.0*  MCV 86.8 87.5 90.3 90.9  PLT 123* 123* 125* 92*   Basic Metabolic Panel:  Recent Labs Lab 10/23/16 2032 10/24/16 0429 10/25/16 0454 10/26/16 0657  NA 129* 132* 132* 133*  K 4.0 3.8 4.6 2.9*  CL 88* 91* 94* 97*  CO2 25 25 28 27   GLUCOSE 118* 112* 142* 110*  BUN 7 5* 11 12  CREATININE 0.76 0.73 0.97 1.04  CALCIUM 8.6* 8.2* 8.0* 7.9*  MG  --   --  1.4* 1.8  PHOS  --   --  3.2 2.3*   GFR: Estimated Creatinine Clearance: 66.1 mL/min (by C-G formula based on SCr of 1.04 mg/dL). Liver Function Tests:  Recent Labs Lab 10/23/16 2032 10/25/16 0454 10/26/16 0657  AST 80* 51* 45*  ALT 50 29 26  ALKPHOS 103 67 62  BILITOT 0.9 1.6* 1.7*  PROT 6.9 5.8* 5.4*  ALBUMIN 4.0 3.6 3.2*   No results for input(s): LIPASE, AMYLASE in the last 168 hours.  Recent Labs Lab 10/24/16 0500  AMMONIA 20   Coagulation Profile:  Recent Labs Lab 10/23/16 2014  INR 1.11   Cardiac Enzymes: No results for input(s): CKTOTAL, CKMB, CKMBINDEX, TROPONINI in the last 168 hours. BNP (last 3 results) No results for input(s): PROBNP in the last 8760 hours. HbA1C: No results for input(s): HGBA1C in the last 72 hours. CBG: No results for input(s): GLUCAP in the last 168 hours. Lipid Profile: No results for input(s): CHOL, HDL, LDLCALC, TRIG, CHOLHDL,  LDLDIRECT in the last 72 hours. Thyroid Function Tests: No results for input(s): TSH, T4TOTAL, FREET4, T3FREE, THYROIDAB in the last 72 hours. Anemia Panel: No results for input(s): VITAMINB12, FOLATE, FERRITIN, TIBC, IRON, RETICCTPCT in the last 72 hours. Sepsis Labs: No results for input(s):  PROCALCITON, LATICACIDVEN in the last 168 hours.  Recent Results (from the past 240 hour(s))  Surgical pcr screen     Status: Abnormal   Collection Time: 10/24/16  9:21 AM  Result Value Ref Range Status   MRSA, PCR POSITIVE (A) NEGATIVE Final    Comment: RESULT CALLED TO, READ BACK BY AND VERIFIED WITH: BAILEY RN AT 1115 ON [id number] BY [facility]    Staphylococcus aureus POSITIVE (A) NEGATIVE Final    Comment: RESULT CALLED TO, READ BACK BY AND VERIFIED WITH: BAILEY RN AT 1115 ON [id number] BY [facility] (NOTE) The Xpert SA Assay (FDA approved for NASAL specimens in patients 70 years of age and older), is one component of a comprehensive surveillance program. It is not intended to diagnose infection nor to guide or monitor treatment.     Radiology Studies: US Renal  Result Date: 10/26/2016 CLINICAL DATA:  Low loss anemia; evaluate for retroperitoneal hematoma EXAM: RENAL / URINARY TRACT ULTRASOUND COMPLETE COMPARISON:  Abdominal and pelvic CT scan of May 18, 2016 FINDINGS: Right Kidney: Length: 9.1 cm. The renal cortical echotexture remains lower than that of the liver. There is no hydronephrosis nor cystic or solid mass. Left Kidney: Length: 13.8 cm. The cortical echotexture is normal similar to that on the right. There is a lower pole cyst measuring 1.5 cm in greatest dimension. There is no hydronephrosis. Neither kidney exhibits perinephric fluid collections. Bladder: The urinary bladder is partially distended. The bladder wall is subjectively mildly thickened and irregular. No bladder stones or masses are observed. IMPRESSION: No perinephric fluid collections or free fluid collections are observed. If there is  strong clinical suspicion of retroperitoneal hemorrhage, CT scanning is recommended. No hydronephrosis. The right kidney is smaller than the left but this is a chronic finding. There is no evidence of obstruction or acute parenchymal abnormalities. Simple appearing lower pole cyst on the left. Electronically Signed   By: David  Martinique M.D.   On: 10/26/2016 11:57   Scheduled Meds: . aspirin EC  81 mg Oral BID WC  . docusate sodium  100 mg Oral BID  . folic acid  1 mg Oral Daily  . furosemide  20 mg Intravenous Once  . furosemide  40 mg Oral Daily  . Influenza vac split quadrivalent PF  0.5 mL Intramuscular Tomorrow-1000  . loratadine  10 mg Oral Daily  . metoprolol succinate  25 mg Oral Daily  . multivitamin with minerals  1 tablet Oral Daily  . polyethylene glycol  17 g Oral Daily  . potassium chloride  40 mEq Oral BID  . QUEtiapine  300 mg Oral QHS  . senna  1 tablet Oral BID  . thiamine  100 mg Oral Daily  . traZODone  50 mg Oral QHS  . vitamin B-12  100 mcg Oral Daily   Continuous Infusions: . sodium chloride 1 mL (10/26/16 0816)  . lactated ringers 10 mL/hr at 10/24/16 0921  . methocarbamol (ROBAXIN)  IV Stopped (10/24/16 2215)    LOS: 2 days   Kerney Elbe, DO [facility] Pager [phone]  If 7PM-7AM, please contact night-coverage [email or web address] Password [facility] 10/26/2016, 6:38 PM

## 2016-10-27 ENCOUNTER — Inpatient Hospital Stay (HOSPITAL_COMMUNITY): Payer: Medicare Other

## 2016-10-27 LAB — CBC WITH DIFFERENTIAL/PLATELET
BASOS ABS: 0 10*3/uL (ref 0.0–0.1)
BASOS PCT: 0 %
EOS ABS: 0 10*3/uL (ref 0.0–0.7)
Eosinophils Relative: 0 %
HEMATOCRIT: 22.3 % — AB (ref 39.0–52.0)
HEMOGLOBIN: 7.7 g/dL — AB (ref 13.0–17.0)
Lymphocytes Relative: 17 %
Lymphs Abs: 1 10*3/uL (ref 0.7–4.0)
MCH: 31.2 pg (ref 26.0–34.0)
MCHC: 34.5 g/dL (ref 30.0–36.0)
MCV: 90.3 fL (ref 78.0–100.0)
MONOS PCT: 19 %
Monocytes Absolute: 1.1 10*3/uL — ABNORMAL HIGH (ref 0.1–1.0)
NEUTROS ABS: 3.7 10*3/uL (ref 1.7–7.7)
NEUTROS PCT: 63 %
Platelets: 90 10*3/uL — ABNORMAL LOW (ref 150–400)
RBC: 2.47 MIL/uL — AB (ref 4.22–5.81)
RDW: 15.2 % (ref 11.5–15.5)
WBC: 5.8 10*3/uL (ref 4.0–10.5)

## 2016-10-27 LAB — PHOSPHORUS: Phosphorus: 2.3 mg/dL — ABNORMAL LOW (ref 2.5–4.6)

## 2016-10-27 LAB — BASIC METABOLIC PANEL
Anion gap: 9 (ref 5–15)
BUN: 9 mg/dL (ref 6–20)
CO2: 24 mmol/L (ref 22–32)
Calcium: 8 mg/dL — ABNORMAL LOW (ref 8.9–10.3)
Chloride: 102 mmol/L (ref 101–111)
Creatinine, Ser: 0.88 mg/dL (ref 0.61–1.24)
GFR calc Af Amer: >60 mL/min (ref >60)
GFR calc non Af Amer: >60 mL/min (ref >60)
Glucose, Bld: 87 mg/dL (ref 65–99)
Potassium: 3.9 mmol/L (ref 3.5–5.1)
Sodium: 135 mmol/L (ref 135–145)

## 2016-10-27 LAB — BPAM RBC
Blood Product Expiration Date: 201810232359
Blood Product Expiration Date: 201811072359
ISSUE DATE / TIME: 201810161404
ISSUE DATE / TIME: 201810161847
UNIT TYPE AND RH: 6200
Unit Type and Rh: 6200

## 2016-10-27 LAB — TYPE AND SCREEN
ABO/RH(D): A POS
Antibody Screen: NEGATIVE
UNIT DIVISION: 0
Unit division: 0

## 2016-10-27 LAB — CBC
HCT: 24.5 % — ABNORMAL LOW (ref 39.0–52.0)
Hemoglobin: 8.6 g/dL — ABNORMAL LOW (ref 13.0–17.0)
MCH: 32 pg (ref 26.0–34.0)
MCHC: 35.1 g/dL (ref 30.0–36.0)
MCV: 91.1 fL (ref 78.0–100.0)
Platelets: 98 10*3/uL — ABNORMAL LOW (ref 150–400)
RBC: 2.69 MIL/uL — ABNORMAL LOW (ref 4.22–5.81)
RDW: 16.2 % — ABNORMAL HIGH (ref 11.5–15.5)
WBC: 6 10*3/uL (ref 4.0–10.5)

## 2016-10-27 LAB — GLUCOSE, CAPILLARY: GLUCOSE-CAPILLARY: 88 mg/dL (ref 65–99)

## 2016-10-27 LAB — COMPREHENSIVE METABOLIC PANEL
ALBUMIN: 2.9 g/dL — AB (ref 3.5–5.0)
ALK PHOS: 67 U/L (ref 38–126)
ALT: 25 U/L (ref 17–63)
ANION GAP: 10 (ref 5–15)
AST: 43 U/L — AB (ref 15–41)
BILIRUBIN TOTAL: 2.9 mg/dL — AB (ref 0.3–1.2)
BUN: 9 mg/dL (ref 6–20)
CALCIUM: 7.9 mg/dL — AB (ref 8.9–10.3)
CO2: 26 mmol/L (ref 22–32)
Chloride: 98 mmol/L — ABNORMAL LOW (ref 101–111)
Creatinine, Ser: 0.89 mg/dL (ref 0.61–1.24)
GFR calc Af Amer: >60 mL/min (ref >60)
GFR calc non Af Amer: >60 mL/min (ref >60)
GLUCOSE: 93 mg/dL (ref 65–99)
Potassium: 3 mmol/L — ABNORMAL LOW (ref 3.5–5.1)
SODIUM: 134 mmol/L — AB (ref 135–145)
TOTAL PROTEIN: 5.1 g/dL — AB (ref 6.5–8.1)

## 2016-10-27 LAB — MAGNESIUM: Magnesium: 1.5 mg/dL — ABNORMAL LOW (ref 1.7–2.4)

## 2016-10-27 LAB — AMMONIA: Ammonia: 24 umol/L (ref 9–35)

## 2016-10-27 LAB — OCCULT BLOOD X 1 CARD TO LAB, STOOL: Fecal Occult Bld: NEGATIVE

## 2016-10-27 MED ORDER — POTASSIUM PHOSPHATE MONOBASIC 500 MG PO TABS
500.0000 mg | ORAL_TABLET | Freq: Two times a day (BID) | ORAL | Status: DC
  Filled 2016-10-27: qty 1

## 2016-10-27 MED ORDER — MUPIROCIN 2 % EX OINT
1.0000 "application " | TOPICAL_OINTMENT | Freq: Two times a day (BID) | CUTANEOUS | Status: DC
  Administered 2016-10-27 – 2016-10-31 (×9): 1 via NASAL
  Filled 2016-10-27 (×2): qty 22

## 2016-10-27 MED ORDER — CHLORHEXIDINE GLUCONATE CLOTH 2 % EX PADS
6.0000 | MEDICATED_PAD | Freq: Every day | CUTANEOUS | Status: DC
  Administered 2016-10-27 – 2016-10-31 (×4): 6 via TOPICAL

## 2016-10-27 MED ORDER — FLEET ENEMA 7-19 GM/118ML RE ENEM
1.0000 | ENEMA | Freq: Once | RECTAL | Status: DC
  Filled 2016-10-27: qty 1

## 2016-10-27 MED ORDER — POTASSIUM CHLORIDE 10 MEQ/100ML IV SOLN
10.0000 meq | INTRAVENOUS | Status: AC
  Administered 2016-10-27 (×4): 10 meq via INTRAVENOUS
  Filled 2016-10-27 (×4): qty 100

## 2016-10-27 MED ORDER — MAGNESIUM SULFATE 2 GM/50ML IV SOLN
2.0000 g | Freq: Once | INTRAVENOUS | Status: AC
  Administered 2016-10-27: 2 g via INTRAVENOUS
  Filled 2016-10-27: qty 50

## 2016-10-27 MED ORDER — ENSURE ENLIVE PO LIQD
237.0000 mL | Freq: Two times a day (BID) | ORAL | Status: DC
  Administered 2016-10-27: 237 mL via ORAL

## 2016-10-27 MED ORDER — MUPIROCIN 2 % EX OINT: TOPICAL_OINTMENT | Freq: Two times a day (BID) | CUTANEOUS | Status: DC

## 2016-10-27 MED ORDER — NYSTATIN 100000 UNIT/ML MT SUSP
5.0000 mL | Freq: Four times a day (QID) | OROMUCOSAL | Status: DC
  Administered 2016-10-27 – 2016-10-31 (×15): 500000 [IU] via ORAL
  Filled 2016-10-27 (×16): qty 5

## 2016-10-27 MED ORDER — KETOROLAC TROMETHAMINE 15 MG/ML IJ SOLN
7.5000 mg | Freq: Three times a day (TID) | INTRAMUSCULAR | Status: DC | PRN
  Administered 2016-10-27 – 2016-10-31 (×9): 7.5 mg via INTRAVENOUS
  Filled 2016-10-27 (×9): qty 1

## 2016-10-27 MED ORDER — PANTOPRAZOLE SODIUM 40 MG IV SOLR
40.0000 mg | Freq: Two times a day (BID) | INTRAVENOUS | Status: DC
  Administered 2016-10-27 – 2016-10-28 (×3): 40 mg via INTRAVENOUS
  Filled 2016-10-27 (×3): qty 40

## 2016-10-27 MED ORDER — K PHOS MONO-SOD PHOS DI & MONO 155-852-130 MG PO TABS
500.0000 mg | ORAL_TABLET | Freq: Two times a day (BID) | ORAL | Status: DC
  Administered 2016-10-27 – 2016-10-28 (×2): 500 mg via ORAL
  Filled 2016-10-27 (×3): qty 2

## 2016-10-27 NOTE — Progress Notes (Signed)
   Subjective:  Patient reports pain as mild to moderate.  No c/o.   Objective:   VITALS:   Vitals:   10/27/16 0000 10/27/16 0400 10/27/16 0819 10/27/16 1100  BP: 103/68 128/77 (!) 145/92 137/73  Pulse:   80 85  Resp: (!) 29 19 18    Temp: 99.3 F (37.4 C) 99.3 F (37.4 C) 98.4 F (36.9 C) 97.8 F (36.6 C)  TempSrc: Axillary Axillary Oral Oral  SpO2: 92% 99% 100%   Weight:      Height:        NAD ABD soft Sensation intact distally Intact pulses distally Dorsiflexion/Plantar flexion intact Incision: dressing C/D/I Compartment soft   Lab Results  Component Value Date   WBC 6.0 10/27/2016   HGB 8.6 (L) 10/27/2016   HCT 24.5 (L) 10/27/2016   MCV 91.1 10/27/2016   PLT 98 (L) 10/27/2016   BMET    Component Value Date/Time   NA 135 10/27/2016 0735   NA 138 06/04/2016   K 3.9 10/27/2016 0735   CL 102 10/27/2016 0735   CO2 24 10/27/2016 0735   GLUCOSE 87 10/27/2016 0735   BUN 9 10/27/2016 0735   BUN 16 06/04/2016   CREATININE 0.88 10/27/2016 0735   CREATININE 0.95 05/16/2015 0917   CALCIUM 8.0 (L) 10/27/2016 0735   GFRNONAA >60 10/27/2016 0735   GFRAA >60 10/27/2016 0735     Assessment/Plan: 3 Days Post-Op   Active Problems:   HLD (hyperlipidemia)   Alcohol abuse   Essential hypertension   ALCOHOLIC CIRRHOSIS OF LIVER   Hyponatremia   DCM (dilated cardiomyopathy) (HCC)   Chronic pain syndrome   ETOH abuse   Anemia   Closed displaced fracture of left femoral neck (HCC)   Hip fracture (HCC)   Displaced fracture of left femoral neck (HCC)   Thrombocytopenia (HCC)   Hypomagnesemia   WBAT with walker DVT ppx: ASA, SCDs, TEDs PO pain control PT/OT: slow progress, mod to max assist Dispo: SNF placement   Tessa Seaberry, Horald Pollen 10/27/2016, 4:13 PM   Rod Can, MD Cell [phone]

## 2016-10-27 NOTE — Progress Notes (Signed)
Physical Therapy Treatment Patient Details Name: [name] MRN: [id number] DOB: Mar 19, 1945 Today's Date: 10/27/2016    History of Present Illness [name] a 71 y.o.malewith medical history significant of ETOH abuse, Multiple remote fractures, HTN, GERD, HLD, Dementia and other comorbids who presented with fall over rolling chair face first. Thenwith the next fall could not get up and landed on his left Hip and had hip pain and inability to bear weight. He drinks about 2 bottles of wine a day. Left femur fx, underwent left direct anterior THA    PT Comments    Patient required mod/max A for functional transfers and bed mobility with +2 assist to return to supine. +2 assist needed for safety as pt was unable to complete stand pivot transfer and with LOB requiring max A to recover.  Pt's sister in law present throughout session. Current plan remains appropriate.   Follow Up Recommendations  SNF;Supervision/Assistance - 24 hour     Equipment Recommendations  Rolling walker with 5" wheels    Recommendations for Other Services       Precautions / Restrictions Precautions Precautions: Fall Precaution Comments: ETOH withdrawal Restrictions Weight Bearing Restrictions: Yes LLE Weight Bearing: Weight bearing as tolerated    Mobility  Bed Mobility Overal bed mobility: Needs Assistance Bed Mobility: Supine to Sit;Sit to Supine     Supine to sit: Max assist;HOB elevated Sit to supine: Max assist;+2 for physical assistance   General bed mobility comments: assist to bring bilat LE to EOB, elevate trunk into sitting, and scoot hips to EOB with use of bed pad; cues for sequencing; pt unable to assist with L UE due to previous L shoulder injury  Transfers Overall transfer level: Needs assistance Equipment used: Rolling walker (2 wheeled) Transfers: Sit to/from [facility] Sit to Stand: Mod assist Stand pivot transfers: Max assist       General transfer  comment: assist to power up into standing and to gain balance upon standing; multimodla cues for safe hand placement and use of AD; assist to attempt stand pivot ot recliner however pt unable to take pivotal steps and with LOB X1 required max A to recover; pt incontent of stool during transfer and required BSC to be brought up behind him; pt maintained standing balance for pericare with min/mod A and RW and bed brought up behind pt   Ambulation/Gait             General Gait Details: pt unable this session   Stairs            Wheelchair Mobility    Modified Rankin (Stroke Patients Only)       Balance Overall balance assessment: Needs assistance;History of Falls Sitting-balance support: Feet supported;Single extremity supported Sitting balance-Leahy Scale: Fair     Standing balance support: Bilateral upper extremity supported Standing balance-Leahy Scale: Poor Standing balance comment: pt is reliant on bilat UE support and support of therapist                            Cognition Arousal/Alertness: Lethargic (easily arousable ) Behavior During Therapy: WFL for tasks assessed/performed Overall Cognitive Status: History of cognitive impairments - at baseline                                 General Comments: [facility]      Exercises Total Joint Exercises Ankle  Circles/Pumps: AROM;Both;20 reps;Seated Quad Sets: AROM;Both;10 reps;Seated Heel Slides: AAROM;Supine;Both;10 reps;5 reps Hip ABduction/ADduction: AAROM;Left;10 reps;Right;5 reps;Supine    General Comments        Pertinent Vitals/Pain Pain Assessment: Faces Faces Pain Scale: Hurts even more Pain Location: left hip with movement Pain Descriptors / Indicators: Aching;Guarding;Grimacing Pain Intervention(s): Limited activity within patient's tolerance;Monitored during session;Premedicated before session;Repositioned    Home Living                      Prior Function             PT Goals (current goals can now be found in the care plan section) Acute Rehab PT Goals Patient Stated Goal: agreeable to rehab then home PT Goal Formulation: With patient Time For Goal Achievement: 11/01/16 Potential to Achieve Goals: Good Progress towards PT goals: Progressing toward goals    Frequency    Min 5X/week      PT Plan Current plan remains appropriate    Co-evaluation              AM-PAC PT "6 Clicks" Daily Activity  Outcome Measure  Difficulty turning over in bed (including adjusting bedclothes, sheets and blankets)?: Unable Difficulty moving from lying on back to sitting on the side of the bed? : Unable Difficulty sitting down on and standing up from a chair with arms (e.g., wheelchair, bedside commode, etc,.)?: Unable Help needed moving to and from a bed to chair (including a wheelchair)?: A Lot Help needed walking in hospital room?: Total Help needed climbing 3-5 steps with a railing? : Total 6 Click Score: 7    End of Session Equipment Utilized During Treatment: Gait belt Activity Tolerance: Patient limited by pain;Other (comment) (incontinent of stool) Patient left: with call bell/phone within reach;with family/visitor present;in bed;with SCD's reapplied;with restraints reapplied Nurse Communication: Mobility status PT Visit Diagnosis: Unsteadiness on feet (R26.81);Repeated falls (R29.6);History of falling (Z91.81);Pain;Difficulty in walking, not elsewhere classified (R26.2);Other symptoms and signs involving the nervous system (R29.898) Pain - Right/Left: Left Pain - part of body: Hip     Time: 8921-1941 PT Time Calculation (min) (ACUTE ONLY): 63 min  Charges:  $Therapeutic Exercise: 8-22 mins $Therapeutic Activity: 38-52 mins                    G Codes:       Earney Navy, PTA Pager: [phone]     Darliss Cheney 10/27/2016, 4:23 PM

## 2016-10-27 NOTE — Progress Notes (Signed)
Initial Nutrition Assessment  DOCUMENTATION CODES:   Not applicable  INTERVENTION:    Ensure Enlive po BID, each supplement provides 350 kcal and 20 grams of protein  NUTRITION DIAGNOSIS:   Increased nutrient needs related to chronic illness (cirrhosis) as evidenced by estimated needs  GOAL:   Patient will meet greater than or equal to 90% of their needs  MONITOR:   PO intake, Supplement acceptance, Labs, Weight trends, Skin, I & O's  REASON FOR ASSESSMENT:   Consult Assessment of nutrition requirement/status  ASSESSMENT:   71 y.o. Male with medical history significant of ETOH abuse, multiple fractures, HTN, GERD, HLD, Dementia and other comorbids who presented with fall tripped over roling chair a few times.   Pt s/p procedure 10/14: LEFT TOTAL HIP ARTHROPLASTY, ANTERIOR APPROACH  RD spoke with pt's sister-in-law at bedside; assisting pt with lunch. Sister-in-law reports pt's PO intake is variable given his ETOH abuse. Some days he does not eat anything. No recent unintentional weight loss reported. Per readings below, weight has been stable.  Medications reviewed and include Vitamin B12, thiamine, MVI and folvite. Labs reviewed. Na 134 (L). K 3.0 (L). Mg 1.5 (L). Phos 2.3 (L). CBG's [phone].  Nutrition focused physical exam completed.  No muscle or subcutaneous fat depletion noticed.  Diet Order:  Diet Heart Room service appropriate? Yes; Fluid consistency: Thin  Skin:   Reviewed, no issues  Last BM:  N/A  Height:   Ht Readings from Last 1 Encounters:  10/24/16 5\' 9"  (1.753 m)   Weight:   Wt Readings from Last 1 Encounters:  10/24/16 180 lb (81.6 kg)   Wt Readings from Last 10 Encounters:  10/24/16 180 lb (81.6 kg)  09/15/16 184 lb (83.5 kg)  07/08/16 183 lb 12 oz (83.3 kg)  05/26/16 177 lb 1.6 oz (80.3 kg)  05/24/16 185 lb 8 oz (84.1 kg)  05/21/16 185 lb 8 oz (84.1 kg)  03/17/16 173 lb (78.5 kg)  03/05/16 185 lb (83.9 kg)  02/20/16 185 lb  12.8 oz (84.3 kg)  01/21/16 187 lb (84.8 kg)   Ideal Body Weight:  73 kg  BMI:  Body mass index is 26.58 kg/m.  Estimated Nutritional Needs:   Kcal:  2000-2200  Protein:  110-125 gm  Fluid:  2.0-2.2 L  EDUCATION NEEDS:   No education needs identified at this time  Arthur Holms, RD, LDN Pager #: [phone] After-Hours Pager #: [phone]

## 2016-10-27 NOTE — Progress Notes (Signed)
PROGRESS NOTE    [name]  ZOX:[id number] DOB: 1945-04-08 DOA: 10/23/2016 PCP: Lucille Passy, MD   Brief Narrative:  [name] is a 71 y.o. male with medical history significant of ETOH abuse, Multiple fractures, HTN, GERD, HLD, Dementia and other comorbids who presented with fall tripped over roling chair a few times. He fell face first one time and with the next fall could not get up and landed on his left Hip and had hip pain and inability to bear weight. He drinks about 2 bottles of wine a day. X-Ray showed Femoral Neck Fracture and Orthopedic Surgery was consulted and patient under went Left Total Hip Arthoplasty from an Anterior Approach on 10/24/16. Patient was confused and actively withdrawing so was transferred to SDU and had CIWA Protocol adjusted. He was also found to be Hypokalemic and Anemic so was given repletion and transfused 2 units of blood.   Assessment & Plan:   Active Problems:   HLD (hyperlipidemia)   Alcohol abuse   Essential hypertension   ALCOHOLIC CIRRHOSIS OF LIVER   Hyponatremia   DCM (dilated cardiomyopathy) (HCC)   Chronic pain syndrome   ETOH abuse   Anemia   Closed displaced fracture of left femoral neck (HCC)   Hip fracture (HCC)   Displaced fracture of left femoral neck (HCC)   Thrombocytopenia (HCC)   Hypomagnesemia  Closed Displaced Left Femoral Neck Fracture s/p Left Total Hip Arthroplasty  Anterior Approach POD 2 -Management per Ortho; Patient went to Surgery 10-14 -Head CT and Cervical Spine CT w/o Contrast showed No acute intracranial trauma. Atrophy and microvascular disease. No acute cervical spine fracture.Stable fracture of the dens with posterior fusion at C1 -C2. -Xray of Pelvis showed Left femoral neck fracture, see left femur comparison. No superimposed pelvis fracture -Lumbar X-ray showed No acute fracture or listhesis in the lumbar spine. Mild chronic L1 compression fracture. -CXR showed no No acute cardiopulmonary  process. -PT per ortho.  -Toradol for pain management to avoid sedative.   Alcoholic Liver Cirrhosis; -AST was elevated at 80 and improved to 51 and ALT was 50 and improved to 29; INR was 1.11 -T Bili was slightly elevated at 1.9 -Ammonia was 20 -Patient continues to Drink Alcohol and likely going into withdrawal -repeat ammonia level.   Constipation;  Will order fleet enema.   Encephalopathy; multifactorial.  Alcohol withdrawal, illness.  On CIWA.  Thiamine and folate.   Alcohol Abuse/Concern for Withdrawal -Drinks a couple Bottles of Wine daily -Monitor for withdrawal order on CIWA protocol; Patient starting to show withdrawal symptoms and had some tremors yesterday but today was completely out of it and hallucinating -C/w Folic Acid 1 mg, MVI, Thiamine -continue with CIWA, ativan.   Hypomagnesemia Replete IV magnesium  Hypophosphatemia Replete orally/  Repeat labs in am.   Hypokalemia -replete   Normocytic Anemia compounded by Acute Blood Loss Anemia from Surgery  -Patient's Hb/Hct went from 12.9/36.7 -> 11.9/34.4 -> 8.3/24.2 -> 6.2/18.0 -Transfuse 2 units of pRBC's with IV 20 mg Lasix in between  -Check FOBT  -Checked Retroperitoneal U/S for any Hematoma; Showed No perinephric fluid collections or free fluid collections are observed. No hydronephrosis. The right kidney is smaller than the left but this is a chronic finding. There is no evidence of obstruction or acute parenchymal abnormalities. Simple appearing lower pole cyst on the left. Hb stable.    Essential Hypertension -C/w Metoprolol Succinate 25 mg po Daily Hold lasix.   Hyponatremia -Slightly improved. Na+ went  from 129 -> 132 -> 132 -> 133 -Likely 2/2 to Alcohol Abuse and liver disease  -Continue to Monitor and Repeat CMP  History of Systolic CHF  -Last echogram showed preserved EF  -C/w BB -Currently appears to be hemodynamically stable -hold lasix.   History of Ascites/Abdominal Distention   -Some abdominal distention noted but tympanic -May get Abdominal U/S to evaluate and if shows significant Ascites would get a Paracentesis.  -AST trending down and now 41 -T Bili went up to 1.7  -KUB showed Generous volume of stool and air throughout the colon. No evidence of bowel obstruction or perforation. No biliary or urinary calculi are evident.  Thrombocytopenia -Mild. Platelet Count went from 125 -> 92 -Likely secondary to Alcohol Abuse  Oral thrush; start nystatin.   Nutrition;  nutritionist consulted.   DVT prophylaxis: SCDs'; Pharmacologic Prophylaxis per Ortho Code Status: FULL CODE Family Communication: brother at bedside.  Disposition Plan: Remain Inpatient; Likely SNF vs. Home Health   Consultants:   Orthopedic Surgery Dr. Lyla Glassing    Procedures:  Displaced Left Femoral Neck Fracture s/p Left Total Hip Arthroplasty  Anterior Approach   Antimicrobials:  Anti-infectives    Start     Dose/Rate Route Frequency Ordered Stop   10/25/16 0000  vancomycin (VANCOCIN) IVPB 1000 mg/200 mL premix     1,000 mg 200 mL/hr over 60 Minutes Intravenous Every 12 hours 10/24/16 1436 10/25/16 0200   10/24/16 1130  vancomycin (VANCOCIN) IVPB 1000 mg/200 mL premix     1,000 mg 200 mL/hr over 60 Minutes Intravenous  Once 10/24/16 1118 10/24/16 1249   10/24/16 0915  ceFAZolin (ANCEF) IVPB 2g/100 mL premix  Status:  Discontinued     2 g 200 mL/hr over 30 Minutes Intravenous On call to O.R. 10/24/16 0910 10/24/16 0917   10/24/16 0906  ceFAZolin (ANCEF) 2-4 GM/100ML-% IVPB    Comments:  Laurita Quint   : cabinet override      10/24/16 0906 10/24/16 1002   10/24/16 0600  ceFAZolin (ANCEF) IVPB 2g/100 mL premix     2 g 200 mL/hr over 30 Minutes Intravenous On call to O.R. 10/24/16 0439 10/24/16 1002     Subjective: He is confused.  Sleepy, answer some questions.    Objective: Vitals:   10/26/16 2145 10/27/16 0000 10/27/16 0400 10/27/16 0819  BP: 104/71 103/68 128/77 (!)  145/92  Pulse:    80  Resp: (!) 28 (!) 29 19 18   Temp: 100 F (37.8 C) 99.3 F (37.4 C) 99.3 F (37.4 C) 98.4 F (36.9 C)  TempSrc: Axillary Axillary Axillary Oral  SpO2: 100% 92% 99% 100%  Weight:      Height:        Intake/Output Summary (Last 24 hours) at 10/27/16 1011 Last data filed at 10/27/16 0900  Gross per 24 hour  Intake          3225.08 ml  Output             1250 ml  Net          1975.08 ml   Filed Weights   10/23/16 2017 10/24/16 1438  Weight: 81.6 kg (180 lb) 81.6 kg (180 lb)   Examination: Physical Exam:  Constitutional: confused, somnolent.  Neck: no JVD Respiratory: No wheezing, no crackles.  Cardiovascular: S 1, S 2 RRR Abdomen: Soft, distended, No rigidity  Musculoskeletal: No contractures; No cyanosis Skin: Warm and dry. Mild LE swelling. No rashes or lesions.  Neurologic: somnolent, no tremors.  Data Reviewed: I have personally reviewed following labs and imaging studies  CBC:  Recent Labs Lab 10/23/16 2014 10/24/16 0429 10/25/16 0454 10/26/16 0657 10/27/16 0045 10/27/16 0735  WBC 5.0 8.4 12.2* 5.7 5.8 6.0  NEUTROABS 3.7  --  9.3* 3.7 3.7  --   HGB 12.9* 11.9* 8.3* 6.2* 7.7* 8.6*  HCT 36.7* 34.4* 24.2* 18.0* 22.3* 24.5*  MCV 86.8 87.5 90.3 90.9 90.3 91.1  PLT 123* 123* 125* 92* 90* 98*   Basic Metabolic Panel:  Recent Labs Lab 10/24/16 0429 10/25/16 0454 10/26/16 0657 10/27/16 0045 10/27/16 0735  NA 132* 132* 133* 134* 135  K 3.8 4.6 2.9* 3.0* 3.9  CL 91* 94* 97* 98* 102  CO2 25 28 27 26 24   GLUCOSE 112* 142* 110* 93 87  BUN 5* 11 12 9 9   CREATININE 0.73 0.97 1.04 0.89 0.88  CALCIUM 8.2* 8.0* 7.9* 7.9* 8.0*  MG  --  1.4* 1.8 1.5*  --   PHOS  --  3.2 2.3* 2.3*  --    GFR: Estimated Creatinine Clearance: 78.1 mL/min (by C-G formula based on SCr of 0.88 mg/dL). Liver Function Tests:  Recent Labs Lab 10/23/16 2032 10/25/16 0454 10/26/16 0657 10/27/16 0045  AST 80* 51* 45* 43*  ALT 50 29 26 25   ALKPHOS 103 67  62 67  BILITOT 0.9 1.6* 1.7* 2.9*  PROT 6.9 5.8* 5.4* 5.1*  ALBUMIN 4.0 3.6 3.2* 2.9*   No results for input(s): LIPASE, AMYLASE in the last 168 hours.  Recent Labs Lab 10/24/16 0500  AMMONIA 20   Coagulation Profile:  Recent Labs Lab 10/23/16 2014  INR 1.11   Cardiac Enzymes: No results for input(s): CKTOTAL, CKMB, CKMBINDEX, TROPONINI in the last 168 hours. BNP (last 3 results) No results for input(s): PROBNP in the last 8760 hours. HbA1C: No results for input(s): HGBA1C in the last 72 hours. CBG: No results for input(s): GLUCAP in the last 168 hours. Lipid Profile: No results for input(s): CHOL, HDL, LDLCALC, TRIG, CHOLHDL, LDLDIRECT in the last 72 hours. Thyroid Function Tests: No results for input(s): TSH, T4TOTAL, FREET4, T3FREE, THYROIDAB in the last 72 hours. Anemia Panel: No results for input(s): VITAMINB12, FOLATE, FERRITIN, TIBC, IRON, RETICCTPCT in the last 72 hours. Sepsis Labs: No results for input(s): PROCALCITON, LATICACIDVEN in the last 168 hours.  Recent Results (from the past 240 hour(s))  Surgical pcr screen     Status: Abnormal   Collection Time: 10/24/16  9:21 AM  Result Value Ref Range Status   MRSA, PCR POSITIVE (A) NEGATIVE Final    Comment: RESULT CALLED TO, READ BACK BY AND VERIFIED WITH: BAILEY RN AT 1115 ON [id number] BY [facility]    Staphylococcus aureus POSITIVE (A) NEGATIVE Final    Comment: RESULT CALLED TO, READ BACK BY AND VERIFIED WITH: BAILEY RN AT 1115 ON [id number] BY [facility] (NOTE) The Xpert SA Assay (FDA approved for NASAL specimens in patients 15 years of age and older), is one component of a comprehensive surveillance program. It is not intended to diagnose infection nor to guide or monitor treatment.     Radiology Studies: US Renal  Result Date: 10/26/2016 CLINICAL DATA:  Low loss anemia; evaluate for retroperitoneal hematoma EXAM: RENAL / URINARY TRACT ULTRASOUND COMPLETE COMPARISON:  Abdominal and pelvic CT scan of May 18, 2016  FINDINGS: Right Kidney: Length: 9.1 cm. The renal cortical echotexture remains lower than that of the liver. There is no hydronephrosis nor cystic or solid mass. Left Kidney: Length: 13.8  cm. The cortical echotexture is normal similar to that on the right. There is a lower pole cyst measuring 1.5 cm in greatest dimension. There is no hydronephrosis. Neither kidney exhibits perinephric fluid collections. Bladder: The urinary bladder is partially distended. The bladder wall is subjectively mildly thickened and irregular. No bladder stones or masses are observed. IMPRESSION: No perinephric fluid collections or free fluid collections are observed. If there is strong clinical suspicion of retroperitoneal hemorrhage, CT scanning is recommended. No hydronephrosis. The right kidney is smaller than the left but this is a chronic finding. There is no evidence of obstruction or acute parenchymal abnormalities. Simple appearing lower pole cyst on the left. Electronically Signed   By: David  Martinique M.D.   On: 10/26/2016 11:57   Dg Chest Port 1 View  Result Date: 10/27/2016 CLINICAL DATA:  Shortness of breath and wheezing EXAM: PORTABLE CHEST 1 VIEW COMPARISON:  05/17/2016 FINDINGS: Cardiac enlargement. Lungs are hypoinflated. No pleural effusion or edema identified. No airspace opacities. IMPRESSION: 1. Lungs are hypoinflated but clear. Electronically Signed   By: Kerby Moors M.D.   On: 10/27/2016 08:51   Scheduled Meds: . aspirin EC  81 mg Oral BID WC  . docusate sodium  100 mg Oral BID  . folic acid  1 mg Oral Daily  . Influenza vac split quadrivalent PF  0.5 mL Intramuscular Tomorrow-1000  . loratadine  10 mg Oral Daily  . metoprolol succinate  25 mg Oral Daily  . multivitamin with minerals  1 tablet Oral Daily  . pantoprazole (PROTONIX) IV  40 mg Intravenous Q12H  . polyethylene glycol  17 g Oral Daily  . potassium chloride  40 mEq Oral BID  . QUEtiapine  300 mg Oral QHS  . senna  1 tablet Oral BID  .  sodium phosphate  1 enema Rectal Once  . thiamine  100 mg Oral Daily  . traZODone  50 mg Oral QHS  . vitamin B-12  100 mcg Oral Daily   Continuous Infusions: . sodium chloride 100 mL/hr at 10/27/16 0204    LOS: 3 days   Elmarie Shiley, MD [facility] Pager [phone]  If 7PM-7AM, please contact night-coverage [email or web address] Password [email or web address] 10/27/2016, 10:11 AM

## 2016-10-28 LAB — CBC WITH DIFFERENTIAL/PLATELET
BASOS ABS: 0 10*3/uL (ref 0.0–0.1)
BASOS PCT: 0 %
EOS PCT: 3 %
Eosinophils Absolute: 0.2 10*3/uL (ref 0.0–0.7)
HCT: 23.3 % — ABNORMAL LOW (ref 39.0–52.0)
Hemoglobin: 7.9 g/dL — ABNORMAL LOW (ref 13.0–17.0)
LYMPHS PCT: 17 %
Lymphs Abs: 1.1 10*3/uL (ref 0.7–4.0)
MCH: 31.5 pg (ref 26.0–34.0)
MCHC: 33.9 g/dL (ref 30.0–36.0)
MCV: 92.8 fL (ref 78.0–100.0)
Monocytes Absolute: 1.6 10*3/uL — ABNORMAL HIGH (ref 0.1–1.0)
Monocytes Relative: 24 %
Neutro Abs: 3.6 10*3/uL (ref 1.7–7.7)
Neutrophils Relative %: 55 %
PLATELETS: 118 10*3/uL — AB (ref 150–400)
RBC: 2.51 MIL/uL — ABNORMAL LOW (ref 4.22–5.81)
RDW: 16.3 % — ABNORMAL HIGH (ref 11.5–15.5)
WBC: 6.5 10*3/uL (ref 4.0–10.5)

## 2016-10-28 MED ORDER — PANTOPRAZOLE SODIUM 40 MG PO TBEC
40.0000 mg | DELAYED_RELEASE_TABLET | Freq: Two times a day (BID) | ORAL | Status: DC
  Administered 2016-10-28 – 2016-10-31 (×6): 40 mg via ORAL
  Filled 2016-10-28 (×6): qty 1

## 2016-10-28 NOTE — Progress Notes (Signed)
PROGRESS NOTE    [name]  HAL:[id number] DOB: 01-14-1945 DOA: 10/23/2016 PCP: Lucille Passy, MD    Brief Narrative:  71 year old male who presented with hip pain. Patient does have significant past medical history of alcohol abuse, hypertension, dyslipidemia and dementia. He tripped and fell twice, after second episode, he was not able to stand back on his feet. He drinks 2 bottles of wine daily. On his initial physical examination blood pressure 123/92, heart rate 79, respiratory rate 13, oxygen saturation 100%, moist mucous membranes, lungs were clear to auscultation bilaterally, no wheezing rales or rhonchi, heart S1-S2 present rhythmic, the abdomen was soft nontender, lower extremities with no edema. X-ray showed displaced fracture of the left femoral neck.  Patient was admitted to hospital with working diagnosis of acute left hip fracture.   Patient underwent left hip arthroplasty October 14, his hospitalization complicated with active withdrawal syndrome and postoperative anemia.     Assessment & Plan:   Active Problems:   HLD (hyperlipidemia)   Alcohol abuse   Essential hypertension   ALCOHOLIC CIRRHOSIS OF LIVER   Hyponatremia   DCM (dilated cardiomyopathy) (HCC)   Chronic pain syndrome   ETOH abuse   Anemia   Closed displaced fracture of left femoral neck (HCC)   Hip fracture (HCC)   Displaced fracture of left femoral neck (HCC)   Thrombocytopenia (HCC)   Hypomagnesemia   1. Acute alcohol withdrawal syndrome. Patient more calm and reactive, this am no agitation, last dose of lorazepam was 10/16. Will continue neuro checks per unit protocol, aspiration precautions and physical therapy evaluation. Will continue trazodone, seroquel multivitamins, folic acid and thiamine.   2. Alcoholic cirrhosis. No asterixis or signs of encephalopathy. Will continue neuro checks. Patient with no stigmata of advance liver disease, but known to have esophageal varices.   3.  Hypomagnesemia, hypophosphatemia, hypokalemia. Will continue electrolyte repletion, hold on phosphate supplements, as long P above 1.   4. Postoperative anemia. Patient had prbc transfusion with good toleration, no signs of recurrent bleeding, will follow on cell count.   5. Hypertension. Will continue blood pressure control with metoprolol.   6. Systolic heart failure. Patient clinically euvolemic, will continue telemetry monitoring. Continue b blockade, holding on ace inh for now.   7. Closed displaced left femoral neck fracture status post left hip arthroplasty. Continue pain control and physical therapy recommendations, may need snf at discharge. Continue as needed ketorolac.    DVT prophylaxis: scd  Code Status: full Family Communication:  Disposition Plan: snf   Consultants:   Orthopedic surgery  Procedures:   Left hip arthroplasty 10/14  Antimicrobials:      Subjective: Patient feeling better, no significant agitation. No nausea or vomiting, tolerating po well. Positive mild confusion at night. Complains of left hip pain, moderate in intensity and improved with analgesics.    Objective: Vitals:   10/27/16 1953 10/27/16 2003 10/28/16 0000 10/28/16 0400  BP: (!) 134/96 137/77 130/74 106/65  Pulse: 94 85 (!) 104 82  Resp:   (!) 27 19  Temp: (!) 97.4 F (36.3 C) 98.9 F (37.2 C) 99 F (37.2 C) 98.5 F (36.9 C)  TempSrc: Oral Oral Oral Oral  SpO2: 98% 99% 97% 94%  Weight:    89.8 kg (198 lb)  Height:        Intake/Output Summary (Last 24 hours) at 10/28/16 1041 Last data filed at 10/28/16 0900  Gross per 24 hour  Intake  720 ml  Output              800 ml  Net              -80 ml   Filed Weights   10/23/16 2017 10/24/16 1438 10/28/16 0400  Weight: 81.6 kg (180 lb) 81.6 kg (180 lb) 89.8 kg (198 lb)    Examination:   General: Not in pain or dyspnea, deconditioned Neurology: Awake and alert, non focal, slow to respond to questions. Mild  slurred speech. E ENT: mild pallor, no icterus, oral mucosa moist Cardiovascular: No JVD. S1-S2 present, rhythmic, no gallops, rubs, or murmurs. No lower extremity edema. Pulmonary: vesicular breath sounds bilaterally, adequate air movement, no wheezing, rhonchi or rales. Gastrointestinal. Abdomen flat, no organomegaly, non tender, no rebound or guarding Skin. No rashes Musculoskeletal: no joint deformities     Data Reviewed: I have personally reviewed following labs and imaging studies  CBC:  Recent Labs Lab 10/23/16 2014 10/24/16 0429 10/25/16 0454 10/26/16 0657 10/27/16 0045 10/27/16 0735  WBC 5.0 8.4 12.2* 5.7 5.8 6.0  NEUTROABS 3.7  --  9.3* 3.7 3.7  --   HGB 12.9* 11.9* 8.3* 6.2* 7.7* 8.6*  HCT 36.7* 34.4* 24.2* 18.0* 22.3* 24.5*  MCV 86.8 87.5 90.3 90.9 90.3 91.1  PLT 123* 123* 125* 92* 90* 98*   Basic Metabolic Panel:  Recent Labs Lab 10/24/16 0429 10/25/16 0454 10/26/16 0657 10/27/16 0045 10/27/16 0735  NA 132* 132* 133* 134* 135  K 3.8 4.6 2.9* 3.0* 3.9  CL 91* 94* 97* 98* 102  CO2 25 28 27 26 24   GLUCOSE 112* 142* 110* 93 87  BUN 5* 11 12 9 9   CREATININE 0.73 0.97 1.04 0.89 0.88  CALCIUM 8.2* 8.0* 7.9* 7.9* 8.0*  MG  --  1.4* 1.8 1.5*  --   PHOS  --  3.2 2.3* 2.3*  --    GFR: Estimated Creatinine Clearance: 86.5 mL/min (by C-G formula based on SCr of 0.88 mg/dL). Liver Function Tests:  Recent Labs Lab 10/23/16 2032 10/25/16 0454 10/26/16 0657 10/27/16 0045  AST 80* 51* 45* 43*  ALT 50 29 26 25   ALKPHOS 103 67 62 67  BILITOT 0.9 1.6* 1.7* 2.9*  PROT 6.9 5.8* 5.4* 5.1*  ALBUMIN 4.0 3.6 3.2* 2.9*   No results for input(s): LIPASE, AMYLASE in the last 168 hours.  Recent Labs Lab 10/24/16 0500 10/27/16 1020  AMMONIA 20 24   Coagulation Profile:  Recent Labs Lab 10/23/16 2014  INR 1.11   Cardiac Enzymes: No results for input(s): CKTOTAL, CKMB, CKMBINDEX, TROPONINI in the last 168 hours. BNP (last 3 results) No results for  input(s): PROBNP in the last 8760 hours. HbA1C: No results for input(s): HGBA1C in the last 72 hours. CBG:  Recent Labs Lab 10/27/16 1148  GLUCAP 88   Lipid Profile: No results for input(s): CHOL, HDL, LDLCALC, TRIG, CHOLHDL, LDLDIRECT in the last 72 hours. Thyroid Function Tests: No results for input(s): TSH, T4TOTAL, FREET4, T3FREE, THYROIDAB in the last 72 hours. Anemia Panel: No results for input(s): VITAMINB12, FOLATE, FERRITIN, TIBC, IRON, RETICCTPCT in the last 72 hours.    Radiology Studies: I have reviewed all of the imaging during this hospital visit personally     Scheduled Meds: . aspirin EC  81 mg Oral BID WC  . Chlorhexidine Gluconate Cloth  6 each Topical Q0600  . docusate sodium  100 mg Oral BID  . feeding supplement (ENSURE ENLIVE)  237 mL Oral  BID BM  . folic acid  1 mg Oral Daily  . Influenza vac split quadrivalent PF  0.5 mL Intramuscular Tomorrow-1000  . loratadine  10 mg Oral Daily  . metoprolol succinate  25 mg Oral Daily  . multivitamin with minerals  1 tablet Oral Daily  . mupirocin ointment  1 application Nasal BID  . nystatin  5 mL Oral QID  . pantoprazole (PROTONIX) IV  40 mg Intravenous Q12H  . phosphorus  500 mg Oral BID WC  . polyethylene glycol  17 g Oral Daily  . QUEtiapine  300 mg Oral QHS  . senna  1 tablet Oral BID  . sodium phosphate  1 enema Rectal Once  . thiamine  100 mg Oral Daily  . traZODone  50 mg Oral QHS  . vitamin B-12  100 mcg Oral Daily   Continuous Infusions: . sodium chloride 75 mL/hr at 10/27/16 2048     LOS: 4 days        Bane Hagy Gerome Apley, MD [facility] Pager [phone]

## 2016-10-28 NOTE — Progress Notes (Signed)
Occupational Therapy Treatment Patient Details Name: [name] MRN: [id number] DOB: [birth year] Today's Date: 10/28/2016    History of present illness [name] a 71 y.o.malewith medical history significant of ETOH abuse, Multiple remote fractures, HTN, GERD, HLD, Dementia and other comorbids who presented with fall over rolling chair face first. Thenwith the next fall could not get up and landed on his left Hip and had hip pain and inability to bear weight. He drinks about 2 bottles of wine a day. Left femur fx, underwent left direct anterior THA   OT comments  Pt continues to progress. Improved bed mobility. Able to stand and pivot to chair with min assist. Less tremulous. Continues to demonstrate impaired cognition and be appropriate for SNF.  Follow Up Recommendations  SNF;Supervision/Assistance - 24 hour    Equipment Recommendations       Recommendations for Other Services      Precautions / Restrictions Precautions Precautions: Fall Precaution Comments: ETOH withdrawal Restrictions Weight Bearing Restrictions: Yes LLE Weight Bearing: Weight bearing as tolerated       Mobility Bed Mobility Overal bed mobility: Needs Assistance Bed Mobility: Supine to Sit   Sidelying to sit: Min assist       General bed mobility comments: cues for sequencing, assist to scoot hips to EOB with pad, heavy reliance on rail  Transfers Overall transfer level: Needs assistance Equipment used: Rolling walker (2 wheeled) Transfers: Sit to/from [facility] Sit to Stand: Min assist Stand pivot transfers: Min assist       General transfer comment: cues for hand placement, min assist to rise and steady, assist to advance walker     Balance Overall balance assessment: Needs assistance;History of Falls   Sitting balance-Leahy Scale: Fair     Standing balance support: Bilateral upper extremity supported Standing balance-Leahy Scale: Poor                              ADL either performed or assessed with clinical judgement   ADL Overall ADL's : Needs assistance/impaired Eating/Feeding: Set up;Sitting   Grooming: Wash/dry hands;Wash/dry face;Sitting;Supervision/safety           Upper Body Dressing : Minimal assistance;Sitting                           Vision       Perception     Praxis      Cognition Arousal/Alertness: Awake/alert Behavior During Therapy: WFL for tasks assessed/performed Overall Cognitive Status: Impaired/Different from baseline Area of Impairment: Orientation;Memory;Problem solving;Following commands                 Orientation Level: Disoriented to;Time   Memory: Decreased short-term memory Following Commands: Follows one step commands with increased time     Problem Solving: Slow processing;Decreased initiation;Difficulty sequencing;Requires verbal cues;Requires tactile cues General Comments: HOH        Exercises     Shoulder Instructions       General Comments      Pertinent Vitals/ Pain       Pain Assessment: Faces Faces Pain Scale: Hurts little more Pain Location: left hip with movement Pain Descriptors / Indicators: Grimacing;Guarding Pain Intervention(s): Monitored during session;Repositioned;Premedicated before session  Home Living  Prior Functioning/Environment              Frequency  Min 2X/week        Progress Toward Goals  OT Goals(current goals can now be found in the care plan section)  Progress towards OT goals: Progressing toward goals  Acute Rehab OT Goals Patient Stated Goal: agreeable to rehab then home OT Goal Formulation: With patient Time For Goal Achievement: 11/08/16 Potential to Achieve Goals: Good  Plan Discharge plan remains appropriate    Co-evaluation                 AM-PAC PT "6 Clicks" Daily Activity     Outcome Measure   Help from another  person eating meals?: A Little Help from another person taking care of personal grooming?: A Little Help from another person toileting, which includes using toliet, bedpan, or urinal?: A Lot Help from another person bathing (including washing, rinsing, drying)?: A Lot Help from another person to put on and taking off regular upper body clothing?: A Little Help from another person to put on and taking off regular lower body clothing?: A Lot 6 Click Score: 15    End of Session Equipment Utilized During Treatment: Gait belt;Rolling walker  OT Visit Diagnosis: Other abnormalities of gait and mobility (R26.89);Unsteadiness on feet (R26.81);History of falling (Z91.81);Pain;Other symptoms and signs involving cognitive function Pain - Right/Left: Left Pain - part of body: Hip   Activity Tolerance Patient tolerated treatment well   Patient Left in chair;with call bell/phone within reach;with family/visitor present   Nurse Communication          Time: 3086-5784 OT Time Calculation (min): 29 min  Charges: OT General Charges $OT Visit: 1 Visit OT Treatments $Self Care/Home Management : 8-22 mins $Therapeutic Activity: 8-22 mins  10/28/2016 [name], OTR/L Pager: [phone]   [name] 10/28/2016, 2:06 PM

## 2016-10-28 NOTE — Clinical Social Work Note (Signed)
Clinical Social Work Assessment  Patient Details  Name: [name] MRN: [id number] Date of Birth: [birth year]  Date of referral:  10/28/16               Reason for consult:  Facility Placement                Permission sought to share information with:  Facility Sport and exercise psychologist, Family Supports Permission granted to share information::  Yes, Verbal Permission Granted  Name::     [name]::  SNFs  Relationship::  Spouse  Contact Information:  [phone]  Housing/Transportation Living arrangements for the past 2 months:  Single Family Home Source of Information:  Spouse Patient Interpreter Needed:  None Criminal Activity/Legal Involvement Pertinent to Current Situation/Hospitalization:  No - Comment as needed Significant Relationships:  Spouse Lives with:  Spouse Do you feel safe going back to the place where you live?  No Need for family participation in patient care:  Yes (Comment)  Care giving concerns:  CSW received consult for possible SNF placement at time of discharge. CSW spoke with patient's spouse regarding PT recommendation of SNF placement at time of discharge. She stated that she is currently unable to care for patient at their home given patient's current physical needs and fall risk. Patient's spouse expressed understanding of PT recommendation and is agreeable to SNF placement at time of discharge. CSW to continue to follow and assist with discharge planning needs.   Social Worker assessment / plan:  CSW spoke with patient's spouse concerning possibility of rehab at [name] before returning home.  Employment status:  Retired Forensic scientist:  Medicare PT Recommendations:  [name] / Referral to community resources:  [name]  Patient/Family's Response to care:  Patient's spouse recognizes need for rehab before returning home and is agreeable to a SNF in [name]. Patient reported preference for  [name] because patient has been there before.  Patient/Family's Understanding of and Emotional Response to Diagnosis, Current Treatment, and Prognosis:  Patient/family is realistic regarding therapy needs and expressed being hopeful for SNF placement. Patient's spouse expressed understanding of CSW role and discharge process as well as medical condition. No questions/concerns about plan or treatment.    Emotional Assessment Appearance:  Appears stated age Attitude/Demeanor/Rapport:  Unable to Assess Affect (typically observed):  Unable to Assess Orientation:   (Memory impairment) Alcohol / Substance use:  Not Applicable Psych involvement (Current and /or in the community):  No (Comment)  Discharge Needs  Concerns to be addressed:  Care Coordination Readmission within the last 30 days:  No Current discharge risk:  None Barriers to Discharge:  Continued Medical Work up   [name], [place] 10/28/2016, 5:03 PM

## 2016-10-28 NOTE — NC FL2 (Signed)
[place] MEDICAID FL2 LEVEL OF CARE SCREENING TOOL     IDENTIFICATION  Patient Name: [name] Birthdate: 1945-03-25 Sex: male Admission Date (Current Location): 10/23/2016  [facility] and [place] Number:  Herbalist and Address:  The . [facility], [place] [address] [place],  [address]      Provider Number: [id number]  Attending Physician Name and Address:  Tawni Millers,*  Relative Name and Phone Number:  Nyeem Stoke, [phone]    Current Level of Care: Hospital Recommended Level of Care: [place] Prior Approval Number:    Date Approved/Denied:   PASRR Number: [id number]  Discharge Plan: SNF    Current Diagnoses: Patient Active Problem List   Diagnosis Date Noted  . Hypomagnesemia 10/25/2016  . Hip fracture ([place]) 10/24/2016  . Displaced fracture of left femoral neck ([place]) 10/24/2016  . Thrombocytopenia ([place]) 10/24/2016  . Closed displaced fracture of left femoral neck ([place]) 10/23/2016  . Contracture of muscle of left hand 09/15/2016  . Infected laceration 07/08/2016  . Generalized muscle weakness 07/08/2016  . Anemia 05/18/2016  . Syncope and collapse   . Syncope 05/17/2016  . Cellulitis 05/17/2016  . Rib fractures 05/17/2016  . Bullous pemphigoid 05/17/2016  . ETOH abuse 03/17/2016  . Dizziness and giddiness 12/09/2015  . Chronic pain syndrome 12/09/2015  . Arthritis 11/20/2015  . Mild CAD   . DCM (dilated cardiomyopathy) ([place])   . Abnormal nuclear stress test 05/07/2015  . PAC (premature atrial contraction) 04/07/2015  . Atrial tachycardia, paroxysmal ([place]) 03/10/2015  . Depression 04/12/2012  . C2 cervical fracture ([place]) 02/29/2012  . Alcohol withdrawal ([place]) 11/06/2011  . Fracture of humerus, proximal, left, closed 11/02/2011  . Hyponatremia 11/17/2010  . H/O: upper GI bleed 10/20/2010  . Stomach ulcer from aspirin/ibuprofen-like drugs (NSAID's) 10/20/2010  . Colon polyp 08/26/2010   . Diverticulosis of colon (without mention of hemorrhage) 08/26/2010  . BPH (benign prostatic hyperplasia) 07/02/2010  . Deficiency anemia 03/09/2010  . Hyposmolality and/or hyponatremia 10/17/2009  . ALCOHOLIC CIRRHOSIS OF LIVER 07/25/2009  . Hypokalemia 04/03/2007  . Anxiety state 01/24/2007  . HLD (hyperlipidemia) 07/28/2006  . Alcohol abuse 07/28/2006  . Essential hypertension 07/28/2006  . IMPOTENCE, ORGANIC ORIGIN 07/28/2006    Orientation RESPIRATION BLADDER Height & Weight     Self  Normal Continent Weight: 89.8 kg (198 lb) Height:  5\' 9"  (175.3 cm)  BEHAVIORAL SYMPTOMS/MOOD NEUROLOGICAL BOWEL NUTRITION STATUS      Incontinent Diet (heart room)  AMBULATORY STATUS COMMUNICATION OF NEEDS Skin   Extensive Assist Verbally                         Personal Care Assistance Level of Assistance  Bathing, Feeding, Dressing Bathing Assistance: Limited assistance Feeding assistance: Independent Dressing Assistance: Limited assistance     Functional Limitations Info  Sight, Hearing, Speech Sight Info: Adequate Hearing Info: Adequate Speech Info: Adequate    SPECIAL CARE FACTORS FREQUENCY  PT (By licensed PT), OT (By licensed OT)     PT Frequency: 5x wk OT Frequency: 5x wk            Contractures Contractures Info: Not present    Additional Factors Info  Isolation Precautions Code Status Info: DNR Allergies Info: NIFEDIPINE     Isolation Precautions Info: MRSA in the nose     Current Medications (10/28/2016):  This is the current hospital active medication list Current Facility-Administered Medications  Medication Dose Route Frequency  Provider Last Rate Last Dose  . acetaminophen (TYLENOL) tablet 650 mg  650 mg Oral Q6H PRN Swinteck, Aaron Edelman, MD       Or  . acetaminophen (TYLENOL) suppository 650 mg  650 mg Rectal Q6H PRN Swinteck, Aaron Edelman, MD      . aspirin EC tablet 81 mg  81 mg Oral BID WC Rod Can, MD   81 mg at 10/28/16 0926  . Chlorhexidine  Gluconate Cloth 2 % PADS 6 each  6 each Topical Q0600 Regalado, Belkys A, MD   6 each at 10/27/16 1307  . docusate sodium (COLACE) capsule 100 mg  100 mg Oral BID Rod Can, MD   100 mg at 10/27/16 1013  . feeding supplement (ENSURE ENLIVE) (ENSURE ENLIVE) liquid 237 mL  237 mL Oral BID BM Regalado, Belkys A, MD   237 mL at 10/27/16 1711  . folic acid (FOLVITE) tablet 1 mg  1 mg Oral Daily Doutova, Anastassia, MD   1 mg at 10/28/16 0926  . HYDROcodone-acetaminophen (NORCO/VICODIN) 5-325 MG per tablet 1-2 tablet  1-2 tablet Oral Q6H PRN Toy [place], MD   2 tablet at 10/28/16 0937  . Influenza vac split quadrivalent PF (FLUZONE HIGH-DOSE) injection 0.5 mL  0.5 mL Intramuscular Tomorrow-1000 Swinteck, Aaron Edelman, MD      . ketorolac (TORADOL) 15 MG/ML injection 7.5 mg  7.5 mg Intravenous Q8H PRN Regalado, Belkys A, MD   7.5 mg at 10/28/16 1251  . loratadine (CLARITIN) tablet 10 mg  10 mg Oral Daily Rod Can, MD   10 mg at 10/28/16 0927  . LORazepam (ATIVAN) injection 2-3 mg  2-3 mg Intravenous Q1H PRN Raiford Noble Latif, DO   2 mg at 10/26/16 1539  . menthol-cetylpyridinium (CEPACOL) lozenge 3 mg  1 lozenge Oral PRN Swinteck, Aaron Edelman, MD       Or  . phenol (CHLORASEPTIC) mouth spray 1 spray  1 spray Mouth/Throat PRN Swinteck, Aaron Edelman, MD      . metoCLOPramide (REGLAN) tablet 5-10 mg  5-10 mg Oral Q8H PRN Swinteck, Aaron Edelman, MD       Or  . metoCLOPramide (REGLAN) injection 5-10 mg  5-10 mg Intravenous Q8H PRN Rod Can, MD   10 mg at 10/25/16 0010  . metoprolol succinate (TOPROL-XL) 24 hr tablet 25 mg  25 mg Oral Daily Rod Can, MD   25 mg at 10/28/16 0926  . multivitamin with minerals tablet 1 tablet  1 tablet Oral Daily Toy [place], MD   1 tablet at 10/28/16 0926  . mupirocin ointment (BACTROBAN) 2 % 1 application  1 application Nasal BID Regalado, Belkys A, MD   1 application at 82/50/53 0926  . nystatin (MYCOSTATIN) 100000 UNIT/ML suspension 500,000 Units  5 mL Oral  QID Regalado, Belkys A, MD   500,000 Units at 10/28/16 1250  . ondansetron (ZOFRAN) injection 4 mg  4 mg Intravenous Q6H PRN Toy Baker, MD   4 mg at 10/26/16 2202  . pantoprazole (PROTONIX) EC tablet 40 mg  40 mg Oral BID Rudisill, Jodean Lima, RPH      . polyethylene glycol (MIRALAX / GLYCOLAX) packet 17 g  17 g Oral Daily Rod Can, MD   17 g at 10/26/16 1022  . polyethylene glycol (MIRALAX / GLYCOLAX) packet 17 g  17 g Oral Daily PRN Swinteck, Aaron Edelman, MD      . QUEtiapine (SEROQUEL) tablet 300 mg  300 mg Oral QHS Rod Can, MD   300 mg at 10/27/16 2240  . senna (SENOKOT) tablet  8.6 mg  1 tablet Oral BID Rod Can, MD   8.6 mg at 10/26/16 2157  . sodium phosphate (FLEET) 7-19 GM/118ML enema 1 enema  1 enema Rectal Once Regalado, Belkys A, MD      . thiamine (VITAMIN B-1) tablet 100 mg  100 mg Oral Daily Doutova, Anastassia, MD   100 mg at 10/28/16 0925  . traZODone (DESYREL) tablet 50 mg  50 mg Oral QHS Rod Can, MD   50 mg at 10/27/16 2241  . vitamin B-12 (CYANOCOBALAMIN) tablet 100 mcg  100 mcg Oral Daily Rod Can, MD   100 mcg at 10/28/16 3832     Discharge Medications: Please see discharge summary for a list of discharge medications.  Relevant Imaging Results:  Relevant Lab Results:   Additional Information SS# [id number]  Benard Halsted, LCSWA

## 2016-10-29 LAB — BASIC METABOLIC PANEL
Anion gap: 6 (ref 5–15)
BUN: 13 mg/dL (ref 6–20)
CALCIUM: 8.2 mg/dL — AB (ref 8.9–10.3)
CO2: 26 mmol/L (ref 22–32)
CREATININE: 0.8 mg/dL (ref 0.61–1.24)
Chloride: 104 mmol/L (ref 101–111)
GFR calc Af Amer: >60 mL/min (ref >60)
Glucose, Bld: 106 mg/dL — ABNORMAL HIGH (ref 65–99)
POTASSIUM: 3.2 mmol/L — AB (ref 3.5–5.1)
SODIUM: 136 mmol/L (ref 135–145)

## 2016-10-29 MED ORDER — OXYCODONE HCL 5 MG PO TABS
5.0000 mg | ORAL_TABLET | Freq: Once | ORAL | Status: AC
  Administered 2016-10-29: 5 mg via ORAL
  Filled 2016-10-29: qty 1

## 2016-10-29 MED ORDER — POTASSIUM CHLORIDE 20 MEQ PO PACK
40.0000 meq | PACK | ORAL | Status: AC
  Administered 2016-10-29 (×2): 40 meq via ORAL
  Filled 2016-10-29 (×2): qty 2

## 2016-10-29 NOTE — Care Management Note (Signed)
Case Management Note Marvetta Gibbons RN, BSN Unit 4E-Case Manager [phone]  Patient Details  Name: [name] MRN: [id number] Date of Birth: [birth year]  Subjective/Objective:   Pt admitted s/p fall at home with hip fx- s/p repair- post op went into ETOH withdrawal- CIWA protocol started.                  Action/Plan: PTA pt lived at home- hx of heavy ETOH use- per PT/OT need SNF at discharge- CSW following for placement needs- family prefers [facility]   Expected Discharge Date:                 Expected Discharge Plan:  Skilled Nursing Facility  In-House Referral:  Clinical Social Work  Discharge planning Services  CM Consult  Post Acute Care Choice:  Home Health, Durable Medical Equipment Choice offered to:     DME Arranged:    DME Agency:     HH Arranged:    [place] Agency:     Status of Service:  In process, will continue to follow  If discussed at Long Length of Stay Meetings, dates discussed:    Discharge Disposition: skilled facility  Additional Comments:  Dawayne Patricia, RN 10/29/2016, 4:24 PM

## 2016-10-29 NOTE — Clinical Social Work Note (Signed)
[facility] will have a bed for patient on Sunday. Patient's wife and sister-in-law updated. CSW paged MD to notify.  Dayton Scrape, [place]

## 2016-10-29 NOTE — Progress Notes (Signed)
PROGRESS NOTE    [name]  DXI:[id number] DOB: 06-27-1945 DOA: 10/23/2016 PCP: Lucille Passy, MD    Brief Narrative:  71 year old male who presented with hip pain. Patient does have significant past medical history of alcohol abuse, hypertension, dyslipidemia and dementia. He tripped and fell twice, after second episode, he was not able to stand back on his feet. He drinks 2 bottles of wine daily. On his initial physical examination blood pressure 123/92, heart rate 79, respiratory rate 13, oxygen saturation 100%, moist mucous membranes, lungs were clear to auscultation bilaterally, no wheezing rales or rhonchi, heart S1-S2 present rhythmic, the abdomen was soft nontender, lower extremities with no edema. X-ray showed displaced fracture of the left femoral neck.  Patient was admitted to hospital with working diagnosis of acute left hip fracture.   Patient underwent left hip arthroplasty October 14, his hospitalization complicated with active withdrawal syndrome and postoperative anemia   Assessment & Plan:   Active Problems:   HLD (hyperlipidemia)   Alcohol abuse   Essential hypertension   ALCOHOLIC CIRRHOSIS OF LIVER   Hyponatremia   DCM (dilated cardiomyopathy) (HCC)   Chronic pain syndrome   ETOH abuse   Anemia   Closed displaced fracture of left femoral neck (HCC)   Hip fracture (HCC)   Displaced fracture of left femoral neck (HCC)   Thrombocytopenia (HCC)   Hypomagnesemia  1. Acute alcohol withdrawal syndrome. Last dose of lorazepam was 10/16.  Neuro checks per unit protocol, continue aspiration precautions and physical therapy evaluation. On trazodone, seroquel multivitamins, folic acid and thiamine. Will need snf at discharge.   2. Alcoholic cirrhosis. No stigmata of advance liver disease, but known to have esophageal varices. Will need outpatient follow up.   3. Hypomagnesemia, hypophosphatemia, hypokalemia. K repletion with kcl, will follow on renal panel in  am.    4. Postoperative anemia. SP prbc transfusion with good toleration. Hb and hct stable at 7,9, will follow cell count in am.  5. Hypertension. Tolerating well metoprolol for blood pressure control.  6. Systolic heart failure. On b blockade, holding on ace inh for now due risk of hypotension.   7. Closed displaced left femoral neck fracture status post left hip arthroplasty. Continue pain control with hydrocodone, acetaminophen, ketorolac. Will need SNF at discharge.  DVT prophylaxis: scd  Code Status: full Family Communication:  Disposition Plan: snf   Consultants:   Orthopedic surgery  Procedures:   Left hip arthroplasty 10/14  Antimicrobials:   Subjective: Patient has been able to ambulate with physical therapy, no chest pain or dyspnea, no nausea or vomiting. Positive pain on the right hip.   Objective: Vitals:   10/28/16 2027 10/29/16 0004 10/29/16 0323 10/29/16 0800  BP: 129/81 132/81 129/80 132/79  Pulse: 91 85 94   Resp: 14 16 15 14   Temp: 98.2 F (36.8 C) 97.7 F (36.5 C) 98.6 F (37 C) 98.2 F (36.8 C)  TempSrc: Oral Oral Oral Oral  SpO2: 99% 93% 96% 100%  Weight:      Height:        Intake/Output Summary (Last 24 hours) at 10/29/16 1128 Last data filed at 10/29/16 1050  Gross per 24 hour  Intake              358 ml  Output              750 ml  Net             -392 ml   Filed  Weights   10/23/16 2017 10/24/16 1438 10/28/16 0400  Weight: 81.6 kg (180 lb) 81.6 kg (180 lb) 89.8 kg (198 lb)    Examination:   General: Not in pain or dyspnea, deconditioned Neurology: Awake and alert, non focal  E ENT: mild pallor, no icterus, oral mucosa moist Cardiovascular: No JVD. S1-S2 present, rhythmic, no gallops, rubs, or murmurs. No lower extremity edema. Pulmonary: vesicular breath sounds bilaterally, adequate air movement, no wheezing, rhonchi or rales. Mild decreased breath sounds at bases.  Gastrointestinal. Abdomen flat, no organomegaly,  non tender, no rebound or guarding Skin. No rashes Musculoskeletal: no joint deformities     Data Reviewed: I have personally reviewed following labs and imaging studies  CBC:  Recent Labs Lab 10/23/16 2014  10/25/16 0454 10/26/16 0657 10/27/16 0045 10/27/16 0735 10/28/16 1135  WBC 5.0  < > 12.2* 5.7 5.8 6.0 6.5  NEUTROABS 3.7  --  9.3* 3.7 3.7  --  3.6  HGB 12.9*  < > 8.3* 6.2* 7.7* 8.6* 7.9*  HCT 36.7*  < > 24.2* 18.0* 22.3* 24.5* 23.3*  MCV 86.8  < > 90.3 90.9 90.3 91.1 92.8  PLT 123*  < > 125* 92* 90* 98* 118*  < > = values in this interval not displayed. Basic Metabolic Panel:  Recent Labs Lab 10/25/16 0454 10/26/16 0657 10/27/16 0045 10/27/16 0735 10/29/16 0424  NA 132* 133* 134* 135 136  K 4.6 2.9* 3.0* 3.9 3.2*  CL 94* 97* 98* 102 104  CO2 28 27 26 24 26   GLUCOSE 142* 110* 93 87 106*  BUN 11 12 9 9 13   CREATININE 0.97 1.04 0.89 0.88 0.80  CALCIUM 8.0* 7.9* 7.9* 8.0* 8.2*  MG 1.4* 1.8 1.5*  --   --   PHOS 3.2 2.3* 2.3*  --   --    GFR: Estimated Creatinine Clearance: 95.2 mL/min (by C-G formula based on SCr of 0.8 mg/dL). Liver Function Tests:  Recent Labs Lab 10/23/16 2032 10/25/16 0454 10/26/16 0657 10/27/16 0045  AST 80* 51* 45* 43*  ALT 50 29 26 25   ALKPHOS 103 67 62 67  BILITOT 0.9 1.6* 1.7* 2.9*  PROT 6.9 5.8* 5.4* 5.1*  ALBUMIN 4.0 3.6 3.2* 2.9*   No results for input(s): LIPASE, AMYLASE in the last 168 hours.  Recent Labs Lab 10/24/16 0500 10/27/16 1020  AMMONIA 20 24   Coagulation Profile:  Recent Labs Lab 10/23/16 2014  INR 1.11   Cardiac Enzymes: No results for input(s): CKTOTAL, CKMB, CKMBINDEX, TROPONINI in the last 168 hours. BNP (last 3 results) No results for input(s): PROBNP in the last 8760 hours. HbA1C: No results for input(s): HGBA1C in the last 72 hours. CBG:  Recent Labs Lab 10/27/16 1148  GLUCAP 88   Lipid Profile: No results for input(s): CHOL, HDL, LDLCALC, TRIG, CHOLHDL, LDLDIRECT in the last  72 hours. Thyroid Function Tests: No results for input(s): TSH, T4TOTAL, FREET4, T3FREE, THYROIDAB in the last 72 hours. Anemia Panel: No results for input(s): VITAMINB12, FOLATE, FERRITIN, TIBC, IRON, RETICCTPCT in the last 72 hours.    Radiology Studies: I have reviewed all of the imaging during this hospital visit personally     Scheduled Meds: . aspirin EC  81 mg Oral BID WC  . Chlorhexidine Gluconate Cloth  6 each Topical Q0600  . docusate sodium  100 mg Oral BID  . feeding supplement (ENSURE ENLIVE)  237 mL Oral BID BM  . folic acid  1 mg Oral Daily  . Influenza  vac split quadrivalent PF  0.5 mL Intramuscular Tomorrow-1000  . loratadine  10 mg Oral Daily  . metoprolol succinate  25 mg Oral Daily  . multivitamin with minerals  1 tablet Oral Daily  . mupirocin ointment  1 application Nasal BID  . nystatin  5 mL Oral QID  . pantoprazole  40 mg Oral BID  . polyethylene glycol  17 g Oral Daily  . QUEtiapine  300 mg Oral QHS  . senna  1 tablet Oral BID  . sodium phosphate  1 enema Rectal Once  . thiamine  100 mg Oral Daily  . traZODone  50 mg Oral QHS  . vitamin B-12  100 mcg Oral Daily   Continuous Infusions:   LOS: 5 days        Mauricio Gerome Apley, MD [facility] Pager [phone]

## 2016-10-29 NOTE — Progress Notes (Signed)
Physical Therapy Treatment Patient Details Name: [name] MRN: [id number] DOB: 09-05-1945 Today's Date: 10/29/2016    History of Present Illness Gevon Markus Leskois a 71 y.o.malewith medical history significant of ETOH abuse, Multiple remote fractures, HTN, GERD, HLD, Dementia and other comorbids who presented with fall over rolling chair face first. Thenwith the next fall could not get up and landed on his left Hip and had hip pain and inability to bear weight. He drinks about 2 bottles of wine a day. Left femur fx, underwent left direct anterior THA    PT Comments    Patient is making progress toward mobility goals. Pt tolerated gait and therex this session and more alert. Current plan remains appropriate.    Follow Up Recommendations  SNF;Supervision/Assistance - 24 hour     Equipment Recommendations  Rolling walker with 5" wheels    Recommendations for Other Services       Precautions / Restrictions Precautions Precautions: Fall Precaution Comments: ETOH withdrawal Restrictions Weight Bearing Restrictions: Yes LLE Weight Bearing: Weight bearing as tolerated    Mobility  Bed Mobility Overal bed mobility: Needs Assistance Bed Mobility: Supine to Sit     Supine to sit: HOB elevated;Min assist     General bed mobility comments: assist to initiate L LE abduction and to elevate trunk into sitting; use of rail and cues for sequencing  Transfers Overall transfer level: Needs assistance Equipment used: Rolling walker (2 wheeled) Transfers: Sit to/from [facility] Sit to Stand: Min assist         General transfer comment: cues for safe hand placement; assist to steady upon standing  Ambulation/Gait Ambulation/Gait assistance: Min assist;+2 safety/equipment Ambulation Distance (Feet): 70 Feet Assistive device: Rolling walker (2 wheeled) Gait Pattern/deviations: Step-to pattern;Decreased stance time - left;Decreased step length - left;Decreased  step length - right;Decreased dorsiflexion - left;Trunk flexed Gait velocity: decreased   General Gait Details: cues for posture, sequencing, and L heel strike   Stairs            Wheelchair Mobility    Modified Rankin (Stroke Patients Only)       Balance Overall balance assessment: Needs assistance;History of Falls Sitting-balance support: Feet supported;Single extremity supported Sitting balance-Leahy Scale: Fair     Standing balance support: Bilateral upper extremity supported Standing balance-Leahy Scale: Poor                              Cognition Arousal/Alertness: Awake/alert Behavior During Therapy: WFL for tasks assessed/performed Overall Cognitive Status: Impaired/Different from baseline Area of Impairment: Problem solving;Awareness                           Awareness: Intellectual Problem Solving: Slow processing;Requires verbal cues;Requires tactile cues General Comments: HOH      Exercises Total Joint Exercises Heel Slides: AAROM;10 reps;Left;Seated Hip ABduction/ADduction: AAROM;Left;10 reps;Seated Long Arc Quad: AROM;Left;15 reps;Seated    General Comments        Pertinent Vitals/Pain Pain Assessment: Faces Faces Pain Scale: Hurts even more Pain Location: L hip  Pain Descriptors / Indicators: Grimacing;Guarding;Sore Pain Intervention(s): Limited activity within patient's tolerance;Monitored during session;Repositioned;Patient requesting pain meds-RN notified    Home Living                      Prior Function            PT Goals (current goals can now  be found in the care plan section) Acute Rehab PT Goals Patient Stated Goal: agreeable to rehab then home PT Goal Formulation: With patient Time For Goal Achievement: 11/01/16 Potential to Achieve Goals: Good Progress towards PT goals: Progressing toward goals    Frequency    Min 5X/week      PT Plan Current plan remains appropriate     Co-evaluation              AM-PAC PT "6 Clicks" Daily Activity  Outcome Measure  Difficulty turning over in bed (including adjusting bedclothes, sheets and blankets)?: Unable Difficulty moving from lying on back to sitting on the side of the bed? : Unable Difficulty sitting down on and standing up from a chair with arms (e.g., wheelchair, bedside commode, etc,.)?: Unable Help needed moving to and from a bed to chair (including a wheelchair)?: A Little Help needed walking in hospital room?: A Little Help needed climbing 3-5 steps with a railing? : A Lot 6 Click Score: 11    End of Session Equipment Utilized During Treatment: Gait belt Activity Tolerance: Patient tolerated treatment well Patient left: with call bell/phone within reach;with family/visitor present;in chair;with chair alarm set Nurse Communication: Mobility status PT Visit Diagnosis: Unsteadiness on feet (R26.81);Repeated falls (R29.6);History of falling (Z91.81);Pain;Difficulty in walking, not elsewhere classified (R26.2);Other symptoms and signs involving the nervous system (R29.898) Pain - Right/Left: Left Pain - part of body: Hip     Time: 0930-1005 PT Time Calculation (min) (ACUTE ONLY): 35 min  Charges:  $Gait Training: 8-22 mins $Therapeutic Exercise: 8-22 mins                    G Codes:       Earney Navy, PTA Pager: [phone]     Darliss Cheney 10/29/2016, 11:26 AM

## 2016-10-30 DIAGNOSIS — K7031 Alcoholic cirrhosis of liver with ascites: Secondary | ICD-10-CM

## 2016-10-30 LAB — BASIC METABOLIC PANEL
Anion gap: 5 (ref 5–15)
BUN: 13 mg/dL (ref 6–20)
CHLORIDE: 103 mmol/L (ref 101–111)
CO2: 25 mmol/L (ref 22–32)
CREATININE: 0.9 mg/dL (ref 0.61–1.24)
Calcium: 8.4 mg/dL — ABNORMAL LOW (ref 8.9–10.3)
GFR calc Af Amer: >60 mL/min (ref >60)
GFR calc non Af Amer: >60 mL/min (ref >60)
GLUCOSE: 101 mg/dL — AB (ref 65–99)
Potassium: 4.3 mmol/L (ref 3.5–5.1)
SODIUM: 133 mmol/L — AB (ref 135–145)

## 2016-10-30 LAB — CBC
HCT: 23.4 % — ABNORMAL LOW (ref 39.0–52.0)
Hemoglobin: 7.8 g/dL — ABNORMAL LOW (ref 13.0–17.0)
MCH: 31.2 pg (ref 26.0–34.0)
MCHC: 33.3 g/dL (ref 30.0–36.0)
MCV: 93.6 fL (ref 78.0–100.0)
PLATELETS: 141 10*3/uL — AB (ref 150–400)
RBC: 2.5 MIL/uL — ABNORMAL LOW (ref 4.22–5.81)
RDW: 16.8 % — AB (ref 11.5–15.5)
WBC: 6 10*3/uL (ref 4.0–10.5)

## 2016-10-30 MED ORDER — ADULT MULTIVITAMIN W/MINERALS CH: 1.0000 | ORAL_TABLET | Freq: Every day | ORAL | Status: AC

## 2016-10-30 MED ORDER — CYCLOBENZAPRINE HCL 10 MG PO TABS: 10.0000 mg | ORAL_TABLET | Freq: Three times a day (TID) | ORAL | 0 refills | Status: DC | PRN

## 2016-10-30 MED ORDER — FOLIC ACID 1 MG PO TABS: 1.0000 mg | ORAL_TABLET | Freq: Every day | ORAL | Status: AC

## 2016-10-30 MED ORDER — PANTOPRAZOLE SODIUM 40 MG PO TBEC: 40.0000 mg | DELAYED_RELEASE_TABLET | Freq: Every day | ORAL | 0 refills | Status: DC

## 2016-10-30 MED ORDER — OXYCODONE HCL 5 MG PO TABS: 5.0000 mg | ORAL_TABLET | Freq: Four times a day (QID) | ORAL | 0 refills | Status: DC | PRN

## 2016-10-30 NOTE — Progress Notes (Signed)
The pt and his family were worried his Hgb was getting low again since the last time it was check, 10/28/16, it was 7.9. They also wished to see if the pt could get a different pain medicine because he had a lot of issues with pain earlier.  The on call hospitalist was paged. They ordered a CBC lab draw in the AM and a one time dose of Oxycodone 5 mg by mouth.  After the medicine was given, the pt stated it did lower his pain a little bit.  Pt is currently resting in bed with call bell in reach.  Will continue to monitor.  Lupita Dawn, RN

## 2016-10-30 NOTE — Progress Notes (Signed)
Patient ambulated with one assist from the recliner in his room to the door and back to his recliner patient not willing to ambulate further at this time, patient education completed will continue to monitor.

## 2016-10-30 NOTE — Progress Notes (Signed)
Hgb is 7.8 this AM.

## 2016-10-30 NOTE — Discharge Summary (Addendum)
Physician Discharge Summary  [name] KKX:[id number] DOB: 03/06/1945 DOA: 10/23/2016  PCP: Lucille Passy, MD  Admit date: 10/23/2016 Discharge date: 10/31/2016  Time spent: 45 minutes  Recommendations for Outpatient Follow-up:  -To be discharged to SNF once bed available for ST-rehab purposes following hip fracture and repair.  -Follow up with Dr. Delfino Lovett in 2 weeks.  Discharge Diagnoses:  Active Problems:   HLD (hyperlipidemia)   Alcohol abuse   Essential hypertension   ALCOHOLIC CIRRHOSIS OF LIVER   Hyponatremia   DCM (dilated cardiomyopathy) (HCC)   Chronic pain syndrome   ETOH abuse   Anemia   Closed displaced fracture of left femoral neck (HCC)   Hip fracture (HCC)   Displaced fracture of left femoral neck (HCC)   Thrombocytopenia (HCC)   Hypomagnesemia   Discharge Condition: Stable and improved  Filed Weights   10/23/16 2017 10/24/16 1438 10/28/16 0400  Weight: 81.6 kg (180 lb) 81.6 kg (180 lb) 89.8 kg (198 lb)    History of present illness:  As per Dr. Roel Cluck on 10/13: [name] is a 71 y.o. male with medical history significant of ETOH abuse, multiple fractures, HTN, GERD, HLD, Dementia    Presented with fall tripped over roling chair a few times fell face first one time with the next fall could not get up. He drinks about 2 bottles of wine a day. No chest pain no shortness. No dyspnea on exertion. Reports able to walk up a stairs  Hospital Course:   1. Acute alcohol withdrawal syndrome. Resolved. Last dose of lorazepam was 10/16.  Neuro checks per unit protocol, continue aspiration precautions and physical therapy evaluation. On trazodone, seroquel multivitamins, folic acid and thiamine. Will need snf at discharge.   2. Alcoholic cirrhosis. No stigmata of advance liver disease, but known to have esophageal varices. Will need outpatient follow up.   3. Hypomagnesemia, hypophosphatemia, hypokalemia. All replaced.  4. Postoperative  anemia. SP prbc transfusion with good toleration. Hb and hct stable at 7.9.  5. Hypertension. Tolerating well metoprolol for blood pressure control.  6. Systolic heart failure. On b blockade, consider ACE-I in future if BP tolerates.  7. Closeddisplaced left femoral neck fracture status post left hip arthroplasty. Continue pain control with hydrocodone, acetaminophen, ketorolac. Will need SNF at discharge. And follow up with Dr. Delfino Lovett.  Procedures:  Left Hip arthroplasty   Consultations:  Orthopedics, Dr. Delfino Lovett  Discharge Instructions  Discharge Instructions    Diet - low sodium heart healthy    Complete by:  As directed    Increase activity slowly    Complete by:  As directed      Allergies as of 10/31/2016      Reactions   Nifedipine Swelling   REACTION: SWELLING, 11/02/2011 pt denies this allergy.      Medication List    STOP taking these medications   predniSONE 10 MG tablet Commonly known as:  DELTASONE     TAKE these medications   aspirin 81 MG EC tablet Take 1 tablet (81 mg total) by mouth 2 (two) times daily with a meal.   cetirizine 10 MG tablet Commonly known as:  ZYRTEC Take 1 tablet (10 mg total) by mouth daily.   cyclobenzaprine 10 MG tablet Commonly known as:  FLEXERIL Take 1 tablet (10 mg total) by mouth 3 (three) times daily as needed for muscle spasms.   docusate sodium 100 MG capsule Commonly known as:  COLACE Take 1 capsule (100 mg total)  by mouth daily as needed for mild constipation.   ferrous sulfate 325 (65 FE) MG tablet Take 325 mg by mouth daily with breakfast.   folic acid 1 MG tablet Commonly known as:  FOLVITE Take 1 tablet (1 mg total) by mouth daily.   furosemide 40 MG tablet Commonly known as:  LASIX Take 1 tablet (40 mg total) by mouth daily.   lidocaine 5 % Commonly known as:  LIDODERM Place 1 patch onto the skin daily. Remove & Discard patch within 12 hours or as directed by MD What changed:  when to take  this  reasons to take this  additional instructions   multivitamin with minerals Tabs tablet Take 1 tablet by mouth daily.   oxyCODONE 5 MG immediate release tablet Commonly known as:  Oxy IR/ROXICODONE Take 1 tablet (5 mg total) by mouth every 6 (six) hours as needed for severe pain.   pantoprazole 40 MG tablet Commonly known as:  PROTONIX Take 1 tablet (40 mg total) by mouth daily.   polyethylene glycol packet Commonly known as:  MIRALAX / GLYCOLAX Take 17 g by mouth daily. What changed:  when to take this  reasons to take this   potassium chloride SA 15 MEQ tablet Commonly known as:  KLOR-CON M15 Take 2 tablets (30 mEq total) by mouth daily. What changed:  how much to take   prednisoLONE acetate 1 % ophthalmic suspension Commonly known as:  PRED FORTE Place 1 drop into both eyes 3 (three) times daily.   promethazine 25 MG tablet Commonly known as:  PHENERGAN TAKE 1 TABLET DAILY AS NEEDED FOR NAUSEA   QUEtiapine 300 MG tablet Commonly known as:  SEROQUEL Take 1 tablet (300 mg total) by mouth at bedtime.   thiamine 100 MG tablet Commonly known as:  VITAMIN B-1 Take 100 mg by mouth daily. Reported on 02/17/2015   TOPROL XL 25 MG 24 hr tablet Generic drug:  metoprolol succinate TAKE 1 TABLET DAILY   traZODone 50 MG tablet Commonly known as:  DESYREL Take 1 tablet (50 mg total) by mouth at bedtime.   VITAMIN B-12 PO Take 1 tablet by mouth daily.      Allergies  Allergen Reactions  . Nifedipine Swelling    REACTION: SWELLING, 11/02/2011 pt denies this allergy.    Contact information for follow-up providers    Swinteck, Aaron Edelman, MD. Schedule an appointment as soon as possible for a visit in 2 week(s).   Specialty:  Orthopedic Surgery Why:  For wound re-check Contact information: [address]. [address] [place] [address] [phone]            Contact information for after-discharge care    Destination    [facility] Follow  up.   Specialty:  [place] information: [address] [place] [phone]                   The results of significant diagnostics from this hospitalization (including imaging, microbiology, ancillary and laboratory) are listed below for reference.    Significant Diagnostic Studies: Dg Chest 1 View  Result Date: 10/23/2016 CLINICAL DATA:  FALL today- pt fell while trying to sit in a rolling chair. Left hip pain. Hx left shoulder surgery. EXAM: CHEST 1 VIEW COMPARISON:  05/20/2016 FINDINGS: Normal cardiac silhouette. Lungs are clear. No effusion, infiltrate pneumothorax. Remote LEFT rib fractures noted. IMPRESSION: No acute cardiopulmonary process. Electronically Signed   By: Helane Gunther.D.  On: 10/23/2016 21:07   Dg Lumbar Spine 2-3 Views  Result Date: 10/23/2016 CLINICAL DATA:  71 year old male status post fall today with pain radiating to the left hip. EXAM: LUMBAR SPINE - 2-3 VIEW COMPARISON:  CT Abdomen and Pelvis 05/18/2016. FINDINGS: Normal lumbar segmentation. Stable mild L1 compression fracture. Other lumbar vertebral height and alignment is preserved. Vacuum disc at L3-L4. T12 level appears intact. Grossly intact visible sacrum. Calcified atherosclerosis in the abdomen. IMPRESSION: 1.  No acute fracture or listhesis in the lumbar spine. 2. Mild chronic L1 compression fracture. Electronically Signed   By: Genevie Ann M.D.   On: 10/23/2016 21:11   Dg Pelvis 1-2 Views  Result Date: 10/23/2016 CLINICAL DATA:  71 year old male status post fall today with left hip pain. EXAM: PELVIS - 1-2 VIEW COMPARISON:  Left femur series today reported separately. CT Abdomen and Pelvis 05/18/2016. FINDINGS: Left femoral neck fracture re- demonstrated. Femoral heads remain normally located. Proximal right femur appears intact. Intact pelvis. Calcified iliofemoral atherosclerosis. Negative visible bowel gas pattern. IMPRESSION: 1.  Left femoral neck fracture, see left femur comparison. 2. No superimposed pelvis fracture. Electronically Signed   By: Genevie Ann M.D.   On: 10/23/2016 21:09   Dg Abd 1 View  Result Date: 10/24/2016 CLINICAL DATA:  Abdominal distention EXAM: ABDOMEN - 1 VIEW COMPARISON:  05/18/2016 FINDINGS: Generous volume of stool and air throughout the colon. No evidence of bowel obstruction or perforation. No biliary or urinary calculi are evident. IMPRESSION: Negative for bowel obstruction or perforation. Electronically Signed   By: Andreas Newport M.D.   On: 10/24/2016 03:57   Ct Head Wo Contrast  Result Date: 10/23/2016 CLINICAL DATA:  C-spine trauma, high clinical risk (NEXUS/CCR) Head trauma, minor, GCS>=13, high clinical risk, initial exam. Pt brought in by EMS due to falling while trying to sit in rolling chair. EXAM: CT HEAD WITHOUT CONTRAST CT CERVICAL SPINE WITHOUT CONTRAST TECHNIQUE: Multidetector CT imaging of the head and cervical spine was performed following the standard protocol without intravenous contrast. Multiplanar CT image reconstructions of the cervical spine were also generated. COMPARISON:  None. FINDINGS: CT HEAD FINDINGS Brain: No intracranial hemorrhage. No parenchymal contusion. No midline shift or mass effect. Basilar cisterns are patent. No skull base fracture. No fluid in the paranasal sinuses or mastoid air cells. Orbits are normal. There are periventricular and subcortical white matter hypodensities. Generalized cortical atrophy. Vascular: No hyperdense vessel or unexpected calcification. Skull: Normal. Negative for fracture or focal lesion. Sinuses/Orbits: Paranasal sinuses and mastoid air cells are clear. Orbits are clear. Other: None. CT CERVICAL SPINE FINDINGS Alignment: Normal alignment. Skull base and vertebrae: Remote fracture of the dense without subluxation. Cerclage wire posteriorly at C1-C2. Bilateral pedicle screws at C1-C2. No evidence acute fracture dislocation. Soft  tissues and spinal canal: No prevertebral fluid or swelling. No visible canal hematoma. Disc levels: Mild disc space narrowing in the lower cervical spine with osteophytosis Upper chest: Clear Other: None IMPRESSION: 1. No acute intracranial trauma. 2. Atrophy and microvascular disease. 3. No acute cervical spine fracture. 4. Stable fracture of the dens with posterior fusion at C1 -C2. Electronically Signed   By: Suzy Bouchard M.D.   On: 10/23/2016 21:31   Ct Cervical Spine Wo Contrast  Result Date: 10/23/2016 CLINICAL DATA:  C-spine trauma, high clinical risk (NEXUS/CCR) Head trauma, minor, GCS>=13, high clinical risk, initial exam. Pt brought in by EMS due to falling while trying to sit in rolling chair. EXAM: CT HEAD WITHOUT CONTRAST CT CERVICAL SPINE  WITHOUT CONTRAST TECHNIQUE: Multidetector CT imaging of the head and cervical spine was performed following the standard protocol without intravenous contrast. Multiplanar CT image reconstructions of the cervical spine were also generated. COMPARISON:  None. FINDINGS: CT HEAD FINDINGS Brain: No intracranial hemorrhage. No parenchymal contusion. No midline shift or mass effect. Basilar cisterns are patent. No skull base fracture. No fluid in the paranasal sinuses or mastoid air cells. Orbits are normal. There are periventricular and subcortical white matter hypodensities. Generalized cortical atrophy. Vascular: No hyperdense vessel or unexpected calcification. Skull: Normal. Negative for fracture or focal lesion. Sinuses/Orbits: Paranasal sinuses and mastoid air cells are clear. Orbits are clear. Other: None. CT CERVICAL SPINE FINDINGS Alignment: Normal alignment. Skull base and vertebrae: Remote fracture of the dense without subluxation. Cerclage wire posteriorly at C1-C2. Bilateral pedicle screws at C1-C2. No evidence acute fracture dislocation. Soft tissues and spinal canal: No prevertebral fluid or swelling. No visible canal hematoma. Disc levels: Mild  disc space narrowing in the lower cervical spine with osteophytosis Upper chest: Clear Other: None IMPRESSION: 1. No acute intracranial trauma. 2. Atrophy and microvascular disease. 3. No acute cervical spine fracture. 4. Stable fracture of the dens with posterior fusion at C1 -C2. Electronically Signed   By: Suzy Bouchard M.D.   On: 10/23/2016 21:31   US Renal  Result Date: 10/26/2016 CLINICAL DATA:  Low loss anemia; evaluate for retroperitoneal hematoma EXAM: RENAL / URINARY TRACT ULTRASOUND COMPLETE COMPARISON:  Abdominal and pelvic CT scan of May 18, 2016 FINDINGS: Right Kidney: Length: 9.1 cm. The renal cortical echotexture remains lower than that of the liver. There is no hydronephrosis nor cystic or solid mass. Left Kidney: Length: 13.8 cm. The cortical echotexture is normal similar to that on the right. There is a lower pole cyst measuring 1.5 cm in greatest dimension. There is no hydronephrosis. Neither kidney exhibits perinephric fluid collections. Bladder: The urinary bladder is partially distended. The bladder wall is subjectively mildly thickened and irregular. No bladder stones or masses are observed. IMPRESSION: No perinephric fluid collections or free fluid collections are observed. If there is strong clinical suspicion of retroperitoneal hemorrhage, CT scanning is recommended. No hydronephrosis. The right kidney is smaller than the left but this is a chronic finding. There is no evidence of obstruction or acute parenchymal abnormalities. Simple appearing lower pole cyst on the left. Electronically Signed   By: David  Martinique M.D.   On: 10/26/2016 11:57   Pelvis Portable  Result Date: 10/24/2016 CLINICAL DATA:  Status post left hip replacement today for a femoral neck fracture suffered in a fall 10/23/2016. Initial encounter. EXAM: PORTABLE PELVIS 1-2 VIEWS COMPARISON:  Plain films present single-view of the pelvis 10/23/2016. FINDINGS: New left total hip arthroplasty is in place. The  device is located. No fracture or other acute bony abnormality. Atherosclerosis noted. IMPRESSION: Status post left hip replacement.  No acute abnormality. Electronically Signed   By: Inge Rise M.D.   On: 10/24/2016 13:31   Dg Chest Port 1 View  Result Date: 10/27/2016 CLINICAL DATA:  Shortness of breath and wheezing EXAM: PORTABLE CHEST 1 VIEW COMPARISON:  05/17/2016 FINDINGS: Cardiac enlargement. Lungs are hypoinflated. No pleural effusion or edema identified. No airspace opacities. IMPRESSION: 1. Lungs are hypoinflated but clear. Electronically Signed   By: Kerby Moors M.D.   On: 10/27/2016 08:51   Dg C-arm 61-120 Min  Result Date: 10/24/2016 CLINICAL DATA:  Left hip arthroplasty. EXAM: DG C-ARM 61-120 MIN; OPERATIVE LEFT HIP WITH PELVIS COMPARISON:  10/23/2016  FINDINGS: Intraoperative images demonstrate normal alignment in the frontal projection of a total left hip arthroplasty. IMPRESSION: Normal alignment of left hip arthroplasty. Electronically Signed   By: Aletta Edouard M.D.   On: 10/24/2016 12:24   Dg Hip Operative Unilat W Or W/o Pelvis Left  Result Date: 10/24/2016 CLINICAL DATA:  Left hip arthroplasty. EXAM: DG C-ARM 61-120 MIN; OPERATIVE LEFT HIP WITH PELVIS COMPARISON:  10/23/2016 FINDINGS: Intraoperative images demonstrate normal alignment in the frontal projection of a total left hip arthroplasty. IMPRESSION: Normal alignment of left hip arthroplasty. Electronically Signed   By: Aletta Edouard M.D.   On: 10/24/2016 12:24   Dg Femur Min 2 Views Left  Result Date: 10/23/2016 CLINICAL DATA:  71 year old male status post fall today with left hip pain. EXAM: LEFT FEMUR 2 VIEWS COMPARISON:  CT Abdomen and Pelvis 05/18/2016. FINDINGS: Left femoral neck fracture with varus impaction (arrows). The intertrochanteric segment of the left femur appears intact. Left femoral head remains normally located. Grossly intact visible left hemipelvis. The more distal left femur appears  intact. Preserved alignment at the left knee. Calcified peripheral vascular disease. Medial compartment knee joint degeneration. Possible small knee joint effusion. IMPRESSION: 1. Left femoral neck fracture with varus impaction. 2. No other left femur fracture identified. 3. Possible left knee joint effusion. 4. Calcified peripheral vascular disease. Electronically Signed   By: Genevie Ann M.D.   On: 10/23/2016 21:08    Microbiology: Recent Results (from the past 240 hour(s))  Surgical pcr screen     Status: Abnormal   Collection Time: 10/24/16  9:21 AM  Result Value Ref Range Status   MRSA, PCR POSITIVE (A) NEGATIVE Final    Comment: RESULT CALLED TO, READ BACK BY AND VERIFIED WITH: BAILEY RN AT 1115 ON [id number] BY SJW    Staphylococcus aureus POSITIVE (A) NEGATIVE Final    Comment: RESULT CALLED TO, READ BACK BY AND VERIFIED WITH: BAILEY RN AT 1115 ON [id number] BY SJW (NOTE) The Xpert SA Assay (FDA approved for NASAL specimens in patients 66 years of age and older), is one component of a comprehensive surveillance program. It is not intended to diagnose infection nor to guide or monitor treatment.      Labs: Basic Metabolic Panel:  Recent Labs Lab 10/25/16 0454 10/26/16 0657 10/27/16 0045 10/27/16 0735 10/29/16 0424 10/30/16 0314  NA 132* 133* 134* 135 136 133*  K 4.6 2.9* 3.0* 3.9 3.2* 4.3  CL 94* 97* 98* 102 104 103  CO2 28 27 26 24 26 25   GLUCOSE 142* 110* 93 87 106* 101*  BUN 11 12 9 9 13 13   CREATININE 0.97 1.04 0.89 0.88 0.80 0.90  CALCIUM 8.0* 7.9* 7.9* 8.0* 8.2* 8.4*  MG 1.4* 1.8 1.5*  --   --   --   PHOS 3.2 2.3* 2.3*  --   --   --    Liver Function Tests:  Recent Labs Lab 10/25/16 0454 10/26/16 0657 10/27/16 0045  AST 51* 45* 43*  ALT 29 26 25   ALKPHOS 67 62 67  BILITOT 1.6* 1.7* 2.9*  PROT 5.8* 5.4* 5.1*  ALBUMIN 3.6 3.2* 2.9*   No results for input(s): LIPASE, AMYLASE in the last 168 hours.  Recent Labs Lab 10/27/16 1020  AMMONIA 24    CBC:  Recent Labs Lab 10/25/16 0454 10/26/16 0657 10/27/16 0045 10/27/16 0735 10/28/16 1135 10/30/16 0314  WBC 12.2* 5.7 5.8 6.0 6.5 6.0  NEUTROABS 9.3* 3.7 3.7  --  3.6  --  HGB 8.3* 6.2* 7.7* 8.6* 7.9* 7.8*  HCT 24.2* 18.0* 22.3* 24.5* 23.3* 23.4*  MCV 90.3 90.9 90.3 91.1 92.8 93.6  PLT 125* 92* 90* 98* 118* 141*   Cardiac Enzymes: No results for input(s): CKTOTAL, CKMB, CKMBINDEX, TROPONINI in the last 168 hours. BNP: BNP (last 3 results)  Recent Labs  03/05/16 1812  BNP 19.0    ProBNP (last 3 results) No results for input(s): PROBNP in the last 8760 hours.  CBG:  Recent Labs Lab 10/27/16 1148  GLUCAP 88       Signed:  HERNANDEZ ACOSTA,Trinitey Roache  [facility] Pager: [phone] 10/31/2016, 11:19 AM

## 2016-10-31 DIAGNOSIS — G8911 Acute pain due to trauma: Secondary | ICD-10-CM | POA: Diagnosis not present

## 2016-10-31 DIAGNOSIS — S72002A Fracture of unspecified part of neck of left femur, initial encounter for closed fracture: Secondary | ICD-10-CM | POA: Diagnosis not present

## 2016-10-31 DIAGNOSIS — Y92009 Unspecified place in unspecified non-institutional (private) residence as the place of occurrence of the external cause: Secondary | ICD-10-CM | POA: Diagnosis not present

## 2016-10-31 DIAGNOSIS — S12100A Unspecified displaced fracture of second cervical vertebra, initial encounter for closed fracture: Secondary | ICD-10-CM | POA: Diagnosis not present

## 2016-10-31 DIAGNOSIS — I10 Essential (primary) hypertension: Secondary | ICD-10-CM | POA: Diagnosis not present

## 2016-10-31 DIAGNOSIS — Z7409 Other reduced mobility: Secondary | ICD-10-CM | POA: Diagnosis not present

## 2016-10-31 DIAGNOSIS — K7460 Unspecified cirrhosis of liver: Secondary | ICD-10-CM | POA: Diagnosis not present

## 2016-10-31 DIAGNOSIS — I509 Heart failure, unspecified: Secondary | ICD-10-CM | POA: Diagnosis not present

## 2016-10-31 DIAGNOSIS — R195 Other fecal abnormalities: Secondary | ICD-10-CM | POA: Diagnosis not present

## 2016-10-31 DIAGNOSIS — R488 Other symbolic dysfunctions: Secondary | ICD-10-CM | POA: Diagnosis not present

## 2016-10-31 DIAGNOSIS — R2681 Unsteadiness on feet: Secondary | ICD-10-CM | POA: Diagnosis not present

## 2016-10-31 DIAGNOSIS — K7030 Alcoholic cirrhosis of liver without ascites: Secondary | ICD-10-CM

## 2016-10-31 DIAGNOSIS — Z23 Encounter for immunization: Secondary | ICD-10-CM | POA: Diagnosis not present

## 2016-10-31 DIAGNOSIS — M6281 Muscle weakness (generalized): Secondary | ICD-10-CM | POA: Diagnosis not present

## 2016-10-31 DIAGNOSIS — Z96642 Presence of left artificial hip joint: Secondary | ICD-10-CM | POA: Diagnosis not present

## 2016-10-31 DIAGNOSIS — Z9181 History of falling: Secondary | ICD-10-CM | POA: Diagnosis not present

## 2016-10-31 DIAGNOSIS — Z471 Aftercare following joint replacement surgery: Secondary | ICD-10-CM | POA: Diagnosis not present

## 2016-10-31 DIAGNOSIS — S79009S Unspecified physeal fracture of upper end of unspecified femur, sequela: Secondary | ICD-10-CM | POA: Diagnosis not present

## 2016-10-31 DIAGNOSIS — W050XXA Fall from non-moving wheelchair, initial encounter: Secondary | ICD-10-CM | POA: Diagnosis not present

## 2016-10-31 LAB — OCCULT BLOOD X 1 CARD TO LAB, STOOL: Fecal Occult Bld: NEGATIVE

## 2016-10-31 NOTE — Progress Notes (Signed)
Clinical Social Worker facilitated patient discharge including contacting patient family and facility to confirm patient discharge plans.  Clinical information faxed to facility and family agreeable with plan.  CSW arranged ambulance transport via [facility] to [facility] .  RN to call [phone] (pt will be placed in rm# [phone]) for report prior to discharge.  Clinical Social Worker will sign off for now as social work intervention is no longer needed. Please consult [place] again if new need arises.  Rhea Pink, MSW, [place]

## 2016-10-31 NOTE — Progress Notes (Signed)
Patient in a stable condition, discharge education reviewed with patient and his family at bedside, they verbalized understanding, iv removed tele dc ccmd notified, patient belongings at bedside,new  dressing applied to surgical site patient taken off the unit by EMS to [facility].

## 2016-10-31 NOTE — Progress Notes (Signed)
Patient's DC delayed an extra day due to issues with SNF. No acute events overnight. No changes to DC summary dated 10/30/2016. OK for DC to SNF today.  Domingo Mend, MD [facility] Pager: [phone]

## 2016-10-31 NOTE — Progress Notes (Signed)
Report called to nurse Antonnete at [facility]

## 2016-10-31 NOTE — Progress Notes (Signed)
Subjective: 7 Days Post-Op Procedure(s) (LRB): TOTAL HIP ARTHROPLASTY ANTERIOR APPROACH (Left) Patient reports pain as mild.    Objective: Vital signs in last 24 hours: Temp:  [97.8 F (36.6 C)-98.9 F (37.2 C)] 98.9 F (37.2 C) (10/21 0831) Pulse Rate:  [85-99] 99 (10/21 0831) Resp:  [12-22] 16 (10/21 0831) BP: (118-146)/(78-88) 124/78 (10/21 0831) SpO2:  [95 %-100 %] 97 % (10/21 0831)  Intake/Output from previous day: 10/20 0701 - 10/21 0700 In: 720 [P.O.:720] Out: -  Intake/Output this shift: Total I/O In: 360 [P.O.:360] Out: 1600 [Urine:1600]   Recent Labs  10/30/16 0314  HGB 7.8*    Recent Labs  10/30/16 0314  WBC 6.0  RBC 2.50*  HCT 23.4*  PLT 141*    Recent Labs  10/29/16 0424 10/30/16 0314  NA 136 133*  K 3.2* 4.3  CL 104 103  CO2 26 25  BUN 13 13  CREATININE 0.80 0.90  GLUCOSE 106* 101*  CALCIUM 8.2* 8.4*   No results for input(s): LABPT, INR in the last 72 hours.  PE:  L hip dressed and dry.  NVI at L LE.  Assessment/Plan: 7 Days Post-Op Procedure(s) (LRB): TOTAL HIP ARTHROPLASTY ANTERIOR APPROACH (Left) Discharge to SNF  Ok to change dressing prior to d/c to SNF.  F/u with Dr. Lyla Glassing as scheduled.  Updated pt and friends at bedside.  Wylene Simmer 10/31/2016, 12:04 PM

## 2016-11-02 DIAGNOSIS — S79009S Unspecified physeal fracture of upper end of unspecified femur, sequela: Secondary | ICD-10-CM | POA: Diagnosis not present

## 2016-11-02 DIAGNOSIS — Z9181 History of falling: Secondary | ICD-10-CM | POA: Diagnosis not present

## 2016-11-03 ENCOUNTER — Ambulatory Visit: Payer: Self-pay

## 2016-11-03 NOTE — Telephone Encounter (Signed)
Linus Orn RN also sent this note; Pt.'s wife wants to stop his Trazodone. She feels it is causing confusion. He is currently at [facility] in [place]. Thanks. (Routing comment)     Marcello Moores, RN    e;

## 2016-11-03 NOTE — Telephone Encounter (Signed)
Pt.'s wife wants to stop his Trazodone - she feels it is causing some confusion.

## 2016-11-03 NOTE — Telephone Encounter (Signed)
Please call [name] and give order to d/c trazodone.

## 2016-11-04 DIAGNOSIS — S79009S Unspecified physeal fracture of upper end of unspecified femur, sequela: Secondary | ICD-10-CM | POA: Diagnosis not present

## 2016-11-04 DIAGNOSIS — S72002A Fracture of unspecified part of neck of left femur, initial encounter for closed fracture: Secondary | ICD-10-CM | POA: Diagnosis not present

## 2016-11-04 DIAGNOSIS — I509 Heart failure, unspecified: Secondary | ICD-10-CM | POA: Diagnosis not present

## 2016-11-04 DIAGNOSIS — R195 Other fecal abnormalities: Secondary | ICD-10-CM | POA: Diagnosis not present

## 2016-11-04 DIAGNOSIS — K7460 Unspecified cirrhosis of liver: Secondary | ICD-10-CM | POA: Diagnosis not present

## 2016-11-04 DIAGNOSIS — Z9181 History of falling: Secondary | ICD-10-CM | POA: Diagnosis not present

## 2016-11-04 NOTE — Telephone Encounter (Signed)
I found out that this is an assisted living community/I was not given a number/I called them and [name]/C'ed Trazodone/thx dmf

## 2016-11-09 DIAGNOSIS — S79009S Unspecified physeal fracture of upper end of unspecified femur, sequela: Secondary | ICD-10-CM | POA: Diagnosis not present

## 2016-11-09 DIAGNOSIS — Z9181 History of falling: Secondary | ICD-10-CM | POA: Diagnosis not present

## 2016-11-11 DIAGNOSIS — I10 Essential (primary) hypertension: Secondary | ICD-10-CM | POA: Diagnosis not present

## 2016-11-11 DIAGNOSIS — Z7409 Other reduced mobility: Secondary | ICD-10-CM | POA: Diagnosis not present

## 2016-11-11 DIAGNOSIS — I509 Heart failure, unspecified: Secondary | ICD-10-CM | POA: Diagnosis not present

## 2016-11-11 DIAGNOSIS — Z9181 History of falling: Secondary | ICD-10-CM | POA: Diagnosis not present

## 2016-11-11 DIAGNOSIS — S72002A Fracture of unspecified part of neck of left femur, initial encounter for closed fracture: Secondary | ICD-10-CM | POA: Diagnosis not present

## 2016-11-11 DIAGNOSIS — S79009S Unspecified physeal fracture of upper end of unspecified femur, sequela: Secondary | ICD-10-CM | POA: Diagnosis not present

## 2016-11-12 DIAGNOSIS — Z96642 Presence of left artificial hip joint: Secondary | ICD-10-CM | POA: Diagnosis not present

## 2016-11-12 DIAGNOSIS — Z471 Aftercare following joint replacement surgery: Secondary | ICD-10-CM | POA: Diagnosis not present

## 2016-11-15 DIAGNOSIS — Z9181 History of falling: Secondary | ICD-10-CM | POA: Diagnosis not present

## 2016-11-15 DIAGNOSIS — S79009S Unspecified physeal fracture of upper end of unspecified femur, sequela: Secondary | ICD-10-CM | POA: Diagnosis not present

## 2016-11-19 DIAGNOSIS — Z9181 History of falling: Secondary | ICD-10-CM | POA: Diagnosis not present

## 2016-11-19 DIAGNOSIS — S79009S Unspecified physeal fracture of upper end of unspecified femur, sequela: Secondary | ICD-10-CM | POA: Diagnosis not present

## 2016-11-22 ENCOUNTER — Encounter (INDEPENDENT_AMBULATORY_CARE_PROVIDER_SITE_OTHER): Payer: Medicare Other | Admitting: Ophthalmology

## 2016-11-23 DIAGNOSIS — S79009S Unspecified physeal fracture of upper end of unspecified femur, sequela: Secondary | ICD-10-CM | POA: Diagnosis not present

## 2016-11-23 DIAGNOSIS — Z9181 History of falling: Secondary | ICD-10-CM | POA: Diagnosis not present

## 2016-11-25 DIAGNOSIS — S79009S Unspecified physeal fracture of upper end of unspecified femur, sequela: Secondary | ICD-10-CM | POA: Diagnosis not present

## 2016-11-25 DIAGNOSIS — Z9181 History of falling: Secondary | ICD-10-CM | POA: Diagnosis not present

## 2016-11-26 DIAGNOSIS — R2689 Other abnormalities of gait and mobility: Secondary | ICD-10-CM | POA: Diagnosis not present

## 2016-11-26 DIAGNOSIS — M6281 Muscle weakness (generalized): Secondary | ICD-10-CM | POA: Diagnosis not present

## 2016-11-26 DIAGNOSIS — M25552 Pain in left hip: Secondary | ICD-10-CM | POA: Diagnosis not present

## 2016-11-29 DIAGNOSIS — M25552 Pain in left hip: Secondary | ICD-10-CM | POA: Diagnosis not present

## 2016-11-29 DIAGNOSIS — R2689 Other abnormalities of gait and mobility: Secondary | ICD-10-CM | POA: Diagnosis not present

## 2016-11-29 DIAGNOSIS — M6281 Muscle weakness (generalized): Secondary | ICD-10-CM | POA: Diagnosis not present

## 2016-11-30 ENCOUNTER — Ambulatory Visit (INDEPENDENT_AMBULATORY_CARE_PROVIDER_SITE_OTHER): Payer: Medicare Other | Admitting: Primary Care

## 2016-11-30 ENCOUNTER — Encounter: Payer: Self-pay | Admitting: Primary Care

## 2016-11-30 ENCOUNTER — Other Ambulatory Visit: Payer: Self-pay | Admitting: Family Medicine

## 2016-11-30 VITALS — BP 128/70 | HR 90 | Temp 98.2°F | Ht 66.25 in | Wt 192.8 lb

## 2016-11-30 DIAGNOSIS — M1990 Unspecified osteoarthritis, unspecified site: Secondary | ICD-10-CM | POA: Diagnosis not present

## 2016-11-30 DIAGNOSIS — G8929 Other chronic pain: Secondary | ICD-10-CM

## 2016-11-30 DIAGNOSIS — M25512 Pain in left shoulder: Secondary | ICD-10-CM

## 2016-11-30 MED ORDER — PROMETHAZINE HCL 25 MG PO TABS: ORAL_TABLET | ORAL | 0 refills | Status: DC

## 2016-11-30 MED ORDER — SPIRONOLACTONE 25 MG PO TABS: 25.0000 mg | ORAL_TABLET | Freq: Every day | ORAL | 3 refills | Status: DC

## 2016-11-30 MED ORDER — TRAMADOL HCL 50 MG PO TABS: 50.0000 mg | ORAL_TABLET | Freq: Three times a day (TID) | ORAL | 0 refills | Status: DC | PRN

## 2016-11-30 MED ORDER — CETIRIZINE HCL 10 MG PO TABS: 10.0000 mg | ORAL_TABLET | Freq: Every day | ORAL | 3 refills | Status: DC

## 2016-11-30 NOTE — Patient Instructions (Addendum)
We sent the refill requests over to Dr. Deborra Medina.  You may take the Tramadol 50 mg every 8 hours as needed for severe pain.   Schedule an appointment with Dr. Lorelei Pont for another injection to the shoulder.  Schedule a hospital and nursing home follow up visit with Dr. Deborra Medina at your convenience.   It was a pleasure to see you today!

## 2016-11-30 NOTE — Telephone Encounter (Signed)
Patient was seen with Anda Kraft on 11/30/2016. Patient is undecide if he wants to stay at [facility] or go to [place] with Dr Deborra Medina.  Will send refills to Dr Deborra Medina as instructed.

## 2016-11-30 NOTE — Progress Notes (Signed)
Subjective:    Patient ID: [name], male    DOB: April 24, 1945, 71 y.o.   MRN: [id number]  HPI  [name] is a 71 year old male with a history of arthritis, multiple fractures, chronic left shoulder pain who presents today with a chief complaint of shoulder pain. His pain is located to the left shoulder that has been present for the past 4 years. He had been getting cortisone injections from Sports Medicine with some improvement, no recent injection. He did fall in mid October 2018, fractured his left hip, and his left shoulder has been aggravated since. He is currently involved in outpatient physical therapy for his recent left total hip arthroplasty. He is wanting a refill of his oxycodone that was provided at discharge for his shoulder pain.   Review of Systems  Musculoskeletal: Positive for arthralgias.       Chronic left shoulder pain  Skin: Negative for color change.       Past Medical History:  Diagnosis Date  . Adenomatous polyp of colon 2006  . Anemia 2012  . Anxiety   . Arthritis    "left arm/shoulder" (05/17/2016)  . Atrial tachycardia, paroxysmal ([place]) 04/07/2015  . BPH (benign prostatic hypertrophy) 5/99  . Chronic bronchitis ([place])    "q year or q other year" (11/02/2011)  . Chronic nausea    "a few days/week" (05/17/2016)  . Colon polyp   . DCM (dilated cardiomyopathy) ([place])    EF 45-50% by echo and cath 2017  . Depression    Hx of  . Falls frequently    "daily lately; legs just give out" (11/02/2011; 05/17/2016)  . GERD (gastroesophageal reflux disease) 07/31/04   gastritis   . H/O hiatal hernia   . Hard of hearing   . Heart murmur   . Hernia, umbilical    "unrepaired" (11/02/2011; 05/17/2016)  . History of blood transfusion 2012  . HLD (hyperlipidemia) 5/99  . HTN (hypertension) 5/99  . Memory loss    "recently has been forgetting alot; maybe related to the alcohol" (11/02/2011; 05/17/2016)  . Mild CAD    10% RCA  . PAC (premature atrial contraction)  04/07/2015  . Peripheral vascular disease ([place])   . Pneumonia 1996   hosp  . Pollen allergy    pollen/ragweed  . Seasonal allergies      Social History   Socioeconomic History  . Marital status: Married    Spouse name: Not on file  . Number of children: 3  . Years of education: Not on file  . Highest education level: Not on file  Social Needs  . Financial resource strain: Not on file  . Food insecurity - worry: Not on file  . Food insecurity - inability: Not on file  . Transportation needs - medical: Not on file  . Transportation needs - non-medical: Not on file  Occupational History  . Occupation: Academic librarian: [employer]  Tobacco Use  . Smoking status: Former Smoker    Types: Pipe  . Smokeless tobacco: Never Used  . Tobacco comment: 05/17/2016 "stopped in the late 1990s"  Substance and Sexual Activity  . Alcohol use: Yes    Alcohol/week: 67.2 oz    Types: 112 Glasses of wine per week    Comment: 11/02/2011 "large box of wine q 2 days" 01-27-15 2 glass a day of wine; 05/17/2016 "1.75L bottles; 1- 1 1/2 bottles/day; somedays worse than others; at least 3-4 days/week he'll have  1 1/2 bottles; last drink was 05/16/2016" (05/17/2016)  . Drug use: No  . Sexual activity: No  Other Topics Concern  . Not on file  Social History Narrative   Married (remarried), lives with wife; 3 children, 1 at home.    [employer] - mixes colors       Pt revised designated party release form Brion Sossamon, wife [phone] - can leave msg.     Allen Norris 03/04/10.        Would desire CPR.  Would not want prolonged life support if futile.    Past Surgical History:  Procedure Laterality Date  . BACK SURGERY    . CATARACT EXTRACTION W/ INTRAOCULAR LENS  IMPLANT, BILATERAL Bilateral 10/2002  . COLONOSCOPY  07/31/04   multiple polyps; repeat in 3 years  . DUPUYTREN CONTRACTURE RELEASE Left   . ETT myoview  03/09/07   nml EF 66%  . FRACTURE SURGERY    . HEMORRHOID  SURGERY     "cauterized a long time ago" (11/02/2011)  . hosp CP R/O'D 2/24  03/08/07  . Left Heart Cath and Coronary Angiography N/A 05/23/2015   Performed by Troy Sine, MD at [facility]  . neck MRI  6/04   C5-6 disc abnormality  . OPEN REDUCTION INTERNAL FIXATION (ORIF) PROXIMAL HUMERUS FRACTURE Left 11/04/2011   Performed by Rozanna Box, MD at [facility]  . POSTERIOR CERVICAL FUSION/FORAMINOTOMY LEVEL ONE N/A 03/21/2012   Performed by Charlie Pitter, MD at [facility]URO OR[facility]  . TONSILLECTOMY     "I was young" (11/02/2011)  . TOTAL HIP ARTHROPLASTY ANTERIOR APPROACH Left 10/24/2016   Performed by Rod Can, MD at [facility]  . UPPER GASTROINTESTINAL ENDOSCOPY      Family History  Problem Relation Age of Onset  . Heart disease Father   . Diabetes Father   . Stroke Father   . Depression Mother   . Heart disease Brother   . Alcohol abuse Brother   . Liver disease Brother     Allergies  Allergen Reactions  . Nifedipine Swelling    REACTION: SWELLING, 11/02/2011 pt denies this allergy.    Current Outpatient Medications on File Prior to Visit  Medication Sig Dispense Refill  . cetirizine (ZYRTEC) 10 MG tablet Take 1 tablet (10 mg total) by mouth daily. 90 tablet 3  . Cyanocobalamin (VITAMIN B-12 PO) Take 1 tablet by mouth daily.    . cyclobenzaprine (FLEXERIL) 10 MG tablet Take 1 tablet (10 mg total) by mouth 3 (three) times daily as needed for muscle spasms. 30 tablet 0  . ferrous sulfate 325 (65 FE) MG tablet Take 325 mg by mouth daily with breakfast.    . folic acid (FOLVITE) 1 MG tablet Take 1 tablet (1 mg total) by mouth daily.    [name] lidocaine (LIDODERM) 5 % Place 1 patch onto the skin daily. Remove & Discard patch within 12 hours or as directed by MD (Patient taking differently: Place 1 patch onto the skin daily as needed (pain). Remove & Discard patch within 12 hours or as directed by MD) 90 patch 0  . Multiple Vitamin (MULTIVITAMIN WITH MINERALS) TABS tablet Take 1  tablet by mouth daily.    [name] oxyCODONE (OXY IR/ROXICODONE) 5 MG immediate release tablet Take 1 tablet (5 mg total) by mouth every 6 (six) hours as needed for severe pain. 30 tablet 0  . pantoprazole (PROTONIX) 40 MG tablet Take 1 tablet (40 mg  total) by mouth daily. 30 tablet 0  . polyethylene glycol (MIRALAX / GLYCOLAX) packet Take 17 g by mouth daily. (Patient taking differently: Take 17 g by mouth daily as needed for mild constipation. ) 14 each 0  . potassium chloride SA (KLOR-CON M15) 15 MEQ tablet Take 2 tablets (30 mEq total) by mouth daily. (Patient taking differently: Take 15 mEq by mouth daily. ) 90 tablet 3  . prednisoLONE acetate (PRED FORTE) 1 % ophthalmic suspension Place 1 drop into both eyes 3 (three) times daily.    . promethazine (PHENERGAN) 25 MG tablet TAKE 1 TABLET DAILY AS NEEDED FOR NAUSEA 90 tablet 0  . QUEtiapine (SEROQUEL) 300 MG tablet Take 1 tablet (300 mg total) by mouth at bedtime. 90 tablet 1  . spironolactone (ALDACTONE) 25 MG tablet     . Thiamine HCl (VITAMIN B-1) 100 MG tablet Take 100 mg by mouth daily. Reported on 02/17/2015    . TOPROL XL 25 MG 24 hr tablet TAKE 1 TABLET DAILY 90 tablet 1  . traZODone (DESYREL) 50 MG tablet Take 1 tablet (50 mg total) by mouth at bedtime. 90 tablet 1   No current facility-administered medications on file prior to visit.     BP 128/70   Pulse 90   Temp 98.2 F (36.8 C) (Oral)   Ht 5' 6.25" (1.683 m)   Wt 192 lb 12.8 oz (87.5 kg)   SpO2 96%   BMI 30.88 kg/m    Objective:   Physical Exam  Constitutional: He appears well-nourished.  Neck: Neck supple.  Cardiovascular: Normal rate and regular rhythm.  Pulmonary/Chest: Effort normal and breath sounds normal.  Musculoskeletal:       Left shoulder: He exhibits decreased range of motion and pain. He exhibits no tenderness.  Decrease in ROM in most planes. 4/5 strength.           Assessment & Plan:

## 2016-11-30 NOTE — Assessment & Plan Note (Signed)
Chronic left shoulder pain for years. Increased aggravation since fall in mid October. Discussed that I will not refill his oxycodone and that this medication is not safe for long term use. He did repeat questions and statements during his exam. Given his high fall risk and repetitive questioning do not recommend narcotics.   Discussed that he should schedule another visit with Sports Medicine for consideration of joint injection, he verbalized understanding. Provided him with a 5 day supply of Tramadol with strict precautions regarding drowsiness and falls. Discussed that this is a short term prescription and will not be refilled.

## 2016-12-01 DIAGNOSIS — M25552 Pain in left hip: Secondary | ICD-10-CM | POA: Diagnosis not present

## 2016-12-01 DIAGNOSIS — R2689 Other abnormalities of gait and mobility: Secondary | ICD-10-CM | POA: Diagnosis not present

## 2016-12-01 DIAGNOSIS — M6281 Muscle weakness (generalized): Secondary | ICD-10-CM | POA: Diagnosis not present

## 2016-12-03 DIAGNOSIS — R2689 Other abnormalities of gait and mobility: Secondary | ICD-10-CM | POA: Diagnosis not present

## 2016-12-03 DIAGNOSIS — M6281 Muscle weakness (generalized): Secondary | ICD-10-CM | POA: Diagnosis not present

## 2016-12-03 DIAGNOSIS — M25552 Pain in left hip: Secondary | ICD-10-CM | POA: Diagnosis not present

## 2016-12-06 DIAGNOSIS — R2689 Other abnormalities of gait and mobility: Secondary | ICD-10-CM | POA: Diagnosis not present

## 2016-12-06 DIAGNOSIS — M6281 Muscle weakness (generalized): Secondary | ICD-10-CM | POA: Diagnosis not present

## 2016-12-06 DIAGNOSIS — M25552 Pain in left hip: Secondary | ICD-10-CM | POA: Diagnosis not present

## 2016-12-08 DIAGNOSIS — M6281 Muscle weakness (generalized): Secondary | ICD-10-CM | POA: Diagnosis not present

## 2016-12-08 DIAGNOSIS — R2689 Other abnormalities of gait and mobility: Secondary | ICD-10-CM | POA: Diagnosis not present

## 2016-12-08 DIAGNOSIS — M25552 Pain in left hip: Secondary | ICD-10-CM | POA: Diagnosis not present

## 2016-12-09 ENCOUNTER — Ambulatory Visit (INDEPENDENT_AMBULATORY_CARE_PROVIDER_SITE_OTHER): Payer: Medicare Other | Admitting: Family Medicine

## 2016-12-09 ENCOUNTER — Encounter: Payer: Self-pay | Admitting: Family Medicine

## 2016-12-09 VITALS — BP 100/60 | HR 62 | Temp 97.5°F | Ht 66.25 in | Wt 186.5 lb

## 2016-12-09 DIAGNOSIS — M25512 Pain in left shoulder: Secondary | ICD-10-CM | POA: Diagnosis not present

## 2016-12-09 DIAGNOSIS — G8929 Other chronic pain: Secondary | ICD-10-CM

## 2016-12-09 DIAGNOSIS — Z96642 Presence of left artificial hip joint: Secondary | ICD-10-CM

## 2016-12-09 MED ORDER — ACETAMINOPHEN-CODEINE #3 300-30 MG PO TABS: 1.0000 | ORAL_TABLET | ORAL | 0 refills | Status: DC | PRN

## 2016-12-09 NOTE — Progress Notes (Signed)
Dr. Frederico Hamman T. Ladye Macnaughton, MD, [facility] [place], [address] Phone: [phone] Fax: [phone]  12/09/2016  Patient: [name], MRN: [id number], DOB: April 11, 1945, 71 y.o.  Primary Physician:  Lucille Passy, MD   Chief Complaint  Patient presents with  . Shoulder Pain    Left  . Hip Pain    Left   Subjective:   [name] is a 71 y.o. very pleasant male patient who presents with the following:  I very clearly remember this gentleman who is notably malodorous is and has a history of multiple traumas including cervical spine fracture as well as a glenohumeral fracture on the left with complex operative repair.  He actually is here 6 weeks after a femoral neck fracture and status post total hip arthroplasty done by Dr. Lyla Glassing.  L shoulder past prior [place] fracture. Dr. Marcelino Scot - maybe shoulder replacement.  In the past recommended that he discuss his complex shoulder case with the traumatologist, and Dr. Marcelino Scot has since recommended the patient consider total shoulder arthroplasty.  This point the patient is not ready to do this.  Recent L hip fracture.  Patient fell approximately 6 weeks ago and sustained a hip fracture.  This was treated operatively with a total hip arthroplasty.  He is still having some pain.  Past Medical History, Surgical History, Social History, Family History, Problem List, Medications, and Allergies have been reviewed and updated if relevant.  Patient Active Problem List   Diagnosis Date Noted  . Hypomagnesemia 10/25/2016  . Hip fracture ([place]) 10/24/2016  . Displaced fracture of left femoral neck ([place]) 10/24/2016  . Thrombocytopenia ([place]) 10/24/2016  . Closed displaced fracture of left femoral neck ([place]) 10/23/2016  . Contracture of muscle of left hand 09/15/2016  . Infected laceration 07/08/2016  . Generalized muscle weakness 07/08/2016  . Anemia 05/18/2016  . Syncope and collapse   .  Syncope 05/17/2016  . Cellulitis 05/17/2016  . Rib fractures 05/17/2016  . Bullous pemphigoid 05/17/2016  . ETOH abuse 03/17/2016  . Dizziness and giddiness 12/09/2015  . Chronic pain syndrome 12/09/2015  . Arthritis 11/20/2015  . Mild CAD   . DCM (dilated cardiomyopathy) ([place])   . Abnormal nuclear stress test 05/07/2015  . PAC (premature atrial contraction) 04/07/2015  . Atrial tachycardia, paroxysmal ([place]) 03/10/2015  . Depression 04/12/2012  . C2 cervical fracture ([place]) 02/29/2012  . Alcohol withdrawal ([place]) 11/06/2011  . Fracture of humerus, proximal, left, closed 11/02/2011  . Hyponatremia 11/17/2010  . H/O: upper GI bleed 10/20/2010  . Stomach ulcer from aspirin/ibuprofen-like drugs (NSAID's) 10/20/2010  . Colon polyp 08/26/2010  . Diverticulosis of colon (without mention of hemorrhage) 08/26/2010  . BPH (benign prostatic hyperplasia) 07/02/2010  . Deficiency anemia 03/09/2010  . Hyposmolality and/or hyponatremia 10/17/2009  . ALCOHOLIC CIRRHOSIS OF LIVER 07/25/2009  . Hypokalemia 04/03/2007  . Anxiety state 01/24/2007  . HLD (hyperlipidemia) 07/28/2006  . Alcohol abuse 07/28/2006  . Essential hypertension 07/28/2006  . IMPOTENCE, ORGANIC ORIGIN 07/28/2006    Past Medical History:  Diagnosis Date  . Adenomatous polyp of colon 2006  . Anemia 2012  . Anxiety   . Arthritis    "left arm/shoulder" (05/17/2016)  . Atrial tachycardia, paroxysmal ([place]) 04/07/2015  . BPH (benign prostatic hypertrophy) 5/99  . Chronic bronchitis ([place])    "q year or q other year" (11/02/2011)  . Chronic nausea    "a few days/week" (05/17/2016)  . Colon polyp   .  DCM (dilated cardiomyopathy) ([place])    EF 45-50% by echo and cath 2017  . Depression    Hx of  . Falls frequently    "daily lately; legs just give out" (11/02/2011; 05/17/2016)  . GERD (gastroesophageal reflux disease) 07/31/04   gastritis   . H/O hiatal hernia   . Hard of hearing   . Heart murmur   . Hernia, umbilical     "unrepaired" (11/02/2011; 05/17/2016)  . History of blood transfusion 2012  . HLD (hyperlipidemia) 5/99  . HTN (hypertension) 5/99  . Memory loss    "recently has been forgetting alot; maybe related to the alcohol" (11/02/2011; 05/17/2016)  . Mild CAD    10% RCA  . PAC (premature atrial contraction) 04/07/2015  . Peripheral vascular disease ([place])   . Pneumonia 1996   hosp  . Pollen allergy    pollen/ragweed  . Seasonal allergies     Past Surgical History:  Procedure Laterality Date  . BACK SURGERY    . CARDIAC CATHETERIZATION N/A 05/23/2015   Procedure: Left Heart Cath and Coronary Angiography;  Surgeon: Troy Sine, MD;  Location: [facility];  Service: Cardiovascular;  Laterality: N/A;  . CATARACT EXTRACTION W/ INTRAOCULAR LENS  IMPLANT, BILATERAL Bilateral 10/2002  . COLONOSCOPY  07/31/04   multiple polyps; repeat in 3 years  . DUPUYTREN CONTRACTURE RELEASE Left   . ETT myoview  03/09/07   nml EF 66%  . FRACTURE SURGERY    . HEMORRHOID SURGERY     "cauterized a long time ago" (11/02/2011)  . hosp CP R/O'D 2/24  03/08/07  . neck MRI  6/04   C5-6 disc abnormality  . ORIF HUMERUS FRACTURE  11/04/2011   Procedure: OPEN REDUCTION INTERNAL FIXATION (ORIF) PROXIMAL HUMERUS FRACTURE;  Surgeon: Rozanna Box, MD;  Location: [facility];  Service: Orthopedics;  Laterality: Left;  . POSTERIOR CERVICAL FUSION/FORAMINOTOMY N/A 03/21/2012   Procedure: POSTERIOR CERVICAL FUSION/FORAMINOTOMY LEVEL ONE;  Surgeon: Charlie Pitter, MD;  Location: [facility] NEURO ORS;  Service: Neurosurgery;  Laterality: N/A;  POSTERIOR CERVICAL ONE TO CERVICAL TWO FUSION WITH ILIAC CREST GRAFT AND LATERAL MASS SCREWS   . TONSILLECTOMY     "I was young" (11/02/2011)  . TOTAL HIP ARTHROPLASTY Left 10/24/2016   Procedure: TOTAL HIP ARTHROPLASTY ANTERIOR APPROACH;  Surgeon: Rod Can, MD;  Location: [facility];  Service: Orthopedics;  Laterality: Left;  . UPPER GASTROINTESTINAL ENDOSCOPY      Social History    Socioeconomic History  . Marital status: Married    Spouse name: Not on file  . Number of children: 3  . Years of education: Not on file  . Highest education level: Not on file  Social Needs  . Financial resource strain: Not on file  . Food insecurity - worry: Not on file  . Food insecurity - inability: Not on file  . Transportation needs - medical: Not on file  . Transportation needs - non-medical: Not on file  Occupational History  . Occupation: Academic librarian: [employer]  Tobacco Use  . Smoking status: Former Smoker    Types: Pipe  . Smokeless tobacco: Never Used  . Tobacco comment: 05/17/2016 "stopped in the late 1990s"  Substance and Sexual Activity  . Alcohol use: Yes    Alcohol/week: 67.2 oz    Types: 112 Glasses of wine per week    Comment: 11/02/2011 "large box of wine q 2 days" 01-27-15 2 glass a day of wine; 05/17/2016 "  1.75L bottles; 1- 1 1/2 bottles/day; somedays worse than others; at least 3-4 days/week he'll have 1 1/2 bottles; last drink was 05/16/2016" (05/17/2016)  . Drug use: No  . Sexual activity: No  Other Topics Concern  . Not on file  Social History Narrative   Married (remarried), lives with wife; 3 children, 1 at home.    [employer] - mixes colors       Pt revised designated party release form Harden Bramer, wife [phone] - can leave msg.     Allen Norris 03/04/10.        Would desire CPR.  Would not want prolonged life support if futile.    Family History  Problem Relation Age of Onset  . Heart disease Father   . Diabetes Father   . Stroke Father   . Depression Mother   . Heart disease Brother   . Alcohol abuse Brother   . Liver disease Brother     Allergies  Allergen Reactions  . Nifedipine Swelling    REACTION: SWELLING, 11/02/2011 pt denies this allergy.    Medication list reviewed and updated in full in [place].  GEN: No fevers, chills. Nontoxic. Primarily MSK c/o today. MSK: Detailed in the  HPI GI: tolerating PO intake without difficulty Neuro: No numbness, parasthesias, or tingling associated. Otherwise the pertinent positives of the ROS are noted above.   Objective:   BP 100/60   Pulse 62   Temp (!) 97.5 F (36.4 C) (Oral)   Ht 5' 6.25" (1.683 m)   Wt 186 lb 8 oz (84.6 kg)   BMI 29.88 kg/m    GEN: WDWN - unkempt, NAD, Non-toxic, Alert & Oriented x 3 HEENT: Atraumatic, Normocephalic.  Ears and Nose: No external deformity. EXTR: No clubbing/cyanosis/edema NEURO: Normal gait.  PSYCH: Normally interactive. Conversant. Not depressed or anxious appearing.  Calm demeanor.    Left shoulder has range of motion abduction to approximately 85 degrees and flexion to 90.  There is also notably limitation in external and internal range of motion.  Strength in all directions is 4+/5.  Radiology: No results found.  Assessment and Plan:   Chronic left shoulder pain  History of total left hip replacement  Chronic left shoulder pain status post complex glenohumeral fracture.  Treated by traumatology.  Hardware in the joint.  Likely only definitive management would be total shoulder arthroplasty or other definitive management by a shoulder surgeon.  It would be reasonable in most cases to consider an intra-articular injection, but the patient just had an acute hip fracture 6 weeks ago and is actively healing from this.  I do not think that the bolus of steroids would be the best idea at this point.  If his symptoms continue after 6-8 weeks, would consider an intra-articular injection.  Follow-up: Return if symptoms worsen or fail to improve.  Meds ordered this encounter  Medications  . acetaminophen-codeine (TYLENOL #3) 300-30 MG tablet    Sig: Take 1 tablet by mouth every 4 (four) hours as needed for moderate pain.    Dispense:  20 tablet    Refill:  0   Medications Discontinued During This Encounter  Medication Reason  . polyethylene glycol (MIRALAX / GLYCOLAX) packet  Completed Course  . traMADol (ULTRAM) 50 MG tablet Completed Course   Signed,  Frederico Hamman T. Nyiesha Beever, MD   Allergies as of 12/09/2016      Reactions   Nifedipine Swelling   REACTION: SWELLING, 11/02/2011 pt denies this  allergy.      Medication List        Accurate as of 12/09/16  9:12 AM. Always use your most recent med list.          acetaminophen-codeine 300-30 MG tablet Commonly known as:  TYLENOL #3 Take 1 tablet by mouth every 4 (four) hours as needed for moderate pain.   cetirizine 10 MG tablet Commonly known as:  ZYRTEC Take 1 tablet (10 mg total) daily by mouth.   cyclobenzaprine 10 MG tablet Commonly known as:  FLEXERIL Take 1 tablet (10 mg total) by mouth 3 (three) times daily as needed for muscle spasms.   ferrous sulfate 325 (65 FE) MG tablet Take 325 mg by mouth daily with breakfast.   folic acid 1 MG tablet Commonly known as:  FOLVITE Take 1 tablet (1 mg total) by mouth daily.   lidocaine 5 % Commonly known as:  LIDODERM Place 1 patch onto the skin daily. Remove & Discard patch within 12 hours or as directed by MD   multivitamin with minerals Tabs tablet Take 1 tablet by mouth daily.   oxyCODONE 5 MG immediate release tablet Commonly known as:  Oxy IR/ROXICODONE Take 1 tablet (5 mg total) by mouth every 6 (six) hours as needed for severe pain.   pantoprazole 40 MG tablet Commonly known as:  PROTONIX Take 1 tablet (40 mg total) by mouth daily.   potassium chloride SA 15 MEQ tablet Commonly known as:  KLOR-CON M15 Take 2 tablets (30 mEq total) by mouth daily.   prednisoLONE acetate 1 % ophthalmic suspension Commonly known as:  PRED FORTE Place 1 drop into both eyes 3 (three) times daily.   promethazine 25 MG tablet Commonly known as:  PHENERGAN TAKE 1 TABLET DAILY AS NEEDED FOR NAUSEA   QUEtiapine 300 MG tablet Commonly known as:  SEROQUEL Take 1 tablet (300 mg total) by mouth at bedtime.   spironolactone 25 MG tablet Commonly known  as:  ALDACTONE Take 1 tablet (25 mg total) daily by mouth.   thiamine 100 MG tablet Commonly known as:  VITAMIN B-1 Take 100 mg by mouth daily. Reported on 02/17/2015   TOPROL XL 25 MG 24 hr tablet Generic drug:  metoprolol succinate TAKE 1 TABLET DAILY   traZODone 50 MG tablet Commonly known as:  DESYREL Take 1 tablet (50 mg total) by mouth at bedtime.   VITAMIN B-12 PO Take 1 tablet by mouth daily.

## 2016-12-10 ENCOUNTER — Telehealth: Payer: Self-pay

## 2016-12-10 DIAGNOSIS — R2689 Other abnormalities of gait and mobility: Secondary | ICD-10-CM | POA: Diagnosis not present

## 2016-12-10 DIAGNOSIS — M6281 Muscle weakness (generalized): Secondary | ICD-10-CM | POA: Diagnosis not present

## 2016-12-10 DIAGNOSIS — M25552 Pain in left hip: Secondary | ICD-10-CM | POA: Diagnosis not present

## 2016-12-10 NOTE — Telephone Encounter (Signed)
Copied from [place]. Topic: General - Other >> Dec 10, 2016  1:16 PM Yvette Rack wrote: Reason for CRM: wife Burman Nieves would like for Dr Lorelei Pont nurse to give her a call about pt OV from yesterday she states that her husband was so upset that he really didn't understand what Dr Lorelei Pont had told him yesterday please call wife maggie at [phone]

## 2016-12-12 NOTE — Telephone Encounter (Signed)
Attempted call. He is 6 weeks out from a displaced femoral neck fracture and THA. I went over all of this with him face to face a few times, and I did decline to do a shoulder injection given recent fracture and surgical healing.   No answer today. I will call again tomorrow.

## 2016-12-13 DIAGNOSIS — M25552 Pain in left hip: Secondary | ICD-10-CM | POA: Diagnosis not present

## 2016-12-13 DIAGNOSIS — M6281 Muscle weakness (generalized): Secondary | ICD-10-CM | POA: Diagnosis not present

## 2016-12-13 DIAGNOSIS — R2689 Other abnormalities of gait and mobility: Secondary | ICD-10-CM | POA: Diagnosis not present

## 2016-12-13 NOTE — Telephone Encounter (Signed)
Attempted call again 

## 2016-12-13 NOTE — Telephone Encounter (Signed)
I reviewed with [name] on the phone.

## 2016-12-15 DIAGNOSIS — S72092D Other fracture of head and neck of left femur, subsequent encounter for closed fracture with routine healing: Secondary | ICD-10-CM | POA: Diagnosis not present

## 2016-12-15 DIAGNOSIS — Z96642 Presence of left artificial hip joint: Secondary | ICD-10-CM | POA: Diagnosis not present

## 2016-12-15 DIAGNOSIS — M7062 Trochanteric bursitis, left hip: Secondary | ICD-10-CM | POA: Diagnosis not present

## 2016-12-15 DIAGNOSIS — Z471 Aftercare following joint replacement surgery: Secondary | ICD-10-CM | POA: Diagnosis not present

## 2016-12-17 DIAGNOSIS — M25552 Pain in left hip: Secondary | ICD-10-CM | POA: Diagnosis not present

## 2016-12-17 DIAGNOSIS — M6281 Muscle weakness (generalized): Secondary | ICD-10-CM | POA: Diagnosis not present

## 2016-12-17 DIAGNOSIS — R2689 Other abnormalities of gait and mobility: Secondary | ICD-10-CM | POA: Diagnosis not present

## 2016-12-22 DIAGNOSIS — M6281 Muscle weakness (generalized): Secondary | ICD-10-CM | POA: Diagnosis not present

## 2016-12-22 DIAGNOSIS — R2689 Other abnormalities of gait and mobility: Secondary | ICD-10-CM | POA: Diagnosis not present

## 2016-12-22 DIAGNOSIS — M25552 Pain in left hip: Secondary | ICD-10-CM | POA: Diagnosis not present

## 2016-12-24 DIAGNOSIS — M6281 Muscle weakness (generalized): Secondary | ICD-10-CM | POA: Diagnosis not present

## 2016-12-24 DIAGNOSIS — R2689 Other abnormalities of gait and mobility: Secondary | ICD-10-CM | POA: Diagnosis not present

## 2016-12-24 DIAGNOSIS — M25552 Pain in left hip: Secondary | ICD-10-CM | POA: Diagnosis not present

## 2016-12-27 DIAGNOSIS — M6281 Muscle weakness (generalized): Secondary | ICD-10-CM | POA: Diagnosis not present

## 2016-12-27 DIAGNOSIS — R2689 Other abnormalities of gait and mobility: Secondary | ICD-10-CM | POA: Diagnosis not present

## 2016-12-27 DIAGNOSIS — M25552 Pain in left hip: Secondary | ICD-10-CM | POA: Diagnosis not present

## 2017-01-07 DIAGNOSIS — M25552 Pain in left hip: Secondary | ICD-10-CM | POA: Diagnosis not present

## 2017-01-07 DIAGNOSIS — R2689 Other abnormalities of gait and mobility: Secondary | ICD-10-CM | POA: Diagnosis not present

## 2017-01-07 DIAGNOSIS — M6281 Muscle weakness (generalized): Secondary | ICD-10-CM | POA: Diagnosis not present

## 2017-01-10 DIAGNOSIS — R2689 Other abnormalities of gait and mobility: Secondary | ICD-10-CM | POA: Diagnosis not present

## 2017-01-10 DIAGNOSIS — M25552 Pain in left hip: Secondary | ICD-10-CM | POA: Diagnosis not present

## 2017-01-10 DIAGNOSIS — M6281 Muscle weakness (generalized): Secondary | ICD-10-CM | POA: Diagnosis not present

## 2017-01-12 DIAGNOSIS — Z471 Aftercare following joint replacement surgery: Secondary | ICD-10-CM | POA: Diagnosis not present

## 2017-01-12 DIAGNOSIS — M25552 Pain in left hip: Secondary | ICD-10-CM | POA: Diagnosis not present

## 2017-01-12 DIAGNOSIS — M6281 Muscle weakness (generalized): Secondary | ICD-10-CM | POA: Diagnosis not present

## 2017-01-12 DIAGNOSIS — R2689 Other abnormalities of gait and mobility: Secondary | ICD-10-CM | POA: Diagnosis not present

## 2017-01-14 DIAGNOSIS — Z471 Aftercare following joint replacement surgery: Secondary | ICD-10-CM | POA: Diagnosis not present

## 2017-01-14 DIAGNOSIS — M6281 Muscle weakness (generalized): Secondary | ICD-10-CM | POA: Diagnosis not present

## 2017-01-14 DIAGNOSIS — R2689 Other abnormalities of gait and mobility: Secondary | ICD-10-CM | POA: Diagnosis not present

## 2017-01-14 DIAGNOSIS — M25552 Pain in left hip: Secondary | ICD-10-CM | POA: Diagnosis not present

## 2017-01-17 DIAGNOSIS — R2689 Other abnormalities of gait and mobility: Secondary | ICD-10-CM | POA: Diagnosis not present

## 2017-01-17 DIAGNOSIS — Z471 Aftercare following joint replacement surgery: Secondary | ICD-10-CM | POA: Diagnosis not present

## 2017-01-17 DIAGNOSIS — M25552 Pain in left hip: Secondary | ICD-10-CM | POA: Diagnosis not present

## 2017-01-17 DIAGNOSIS — M6281 Muscle weakness (generalized): Secondary | ICD-10-CM | POA: Diagnosis not present

## 2017-01-24 DIAGNOSIS — Z471 Aftercare following joint replacement surgery: Secondary | ICD-10-CM | POA: Diagnosis not present

## 2017-01-24 DIAGNOSIS — R2689 Other abnormalities of gait and mobility: Secondary | ICD-10-CM | POA: Diagnosis not present

## 2017-01-24 DIAGNOSIS — M6281 Muscle weakness (generalized): Secondary | ICD-10-CM | POA: Diagnosis not present

## 2017-01-24 DIAGNOSIS — M25552 Pain in left hip: Secondary | ICD-10-CM | POA: Diagnosis not present

## 2017-01-25 ENCOUNTER — Other Ambulatory Visit: Payer: Self-pay

## 2017-01-25 MED ORDER — TRAZODONE HCL 50 MG PO TABS: 50.0000 mg | ORAL_TABLET | Freq: Every day | ORAL | 0 refills | Status: DC

## 2017-01-25 MED ORDER — CYCLOBENZAPRINE HCL 10 MG PO TABS: 10.0000 mg | ORAL_TABLET | Freq: Three times a day (TID) | ORAL | 0 refills | Status: DC | PRN

## 2017-01-25 NOTE — Telephone Encounter (Signed)
TA-Also rec req for Trazodone as well/plz advise/thx dmf

## 2017-01-25 NOTE — Telephone Encounter (Signed)
TA-Received refill req for Flexeril for mail order/is refill appropriate? Plz advise/thx dmf

## 2017-01-26 DIAGNOSIS — M25552 Pain in left hip: Secondary | ICD-10-CM | POA: Diagnosis not present

## 2017-01-26 DIAGNOSIS — Z471 Aftercare following joint replacement surgery: Secondary | ICD-10-CM | POA: Diagnosis not present

## 2017-01-26 DIAGNOSIS — M6281 Muscle weakness (generalized): Secondary | ICD-10-CM | POA: Diagnosis not present

## 2017-01-26 DIAGNOSIS — R2689 Other abnormalities of gait and mobility: Secondary | ICD-10-CM | POA: Diagnosis not present

## 2017-01-27 ENCOUNTER — Ambulatory Visit (INDEPENDENT_AMBULATORY_CARE_PROVIDER_SITE_OTHER): Payer: Medicare Other | Admitting: Primary Care

## 2017-01-27 ENCOUNTER — Encounter: Payer: Self-pay | Admitting: Primary Care

## 2017-01-27 VITALS — BP 122/76 | HR 100 | Temp 98.3°F | Ht 66.25 in | Wt 173.0 lb

## 2017-01-27 DIAGNOSIS — Z23 Encounter for immunization: Secondary | ICD-10-CM | POA: Diagnosis not present

## 2017-01-27 DIAGNOSIS — J309 Allergic rhinitis, unspecified: Secondary | ICD-10-CM

## 2017-01-27 MED ORDER — FLUTICASONE PROPIONATE 50 MCG/ACT NA SUSP: 1.0000 | Freq: Two times a day (BID) | NASAL | 0 refills | Status: DC

## 2017-01-27 NOTE — Patient Instructions (Addendum)
Please schedule an appointment to establish with a new PCP.  Continue Zyrtec medication for allergies.  Nasal Congestion/Ear Pressure: Try using Flonase (fluticasone) nasal spray. Instill 1 spray in each nostril twice daily. I sent this to [facility] on [address].  Continue Delsym as needed for cough.  Ensure you are staying hydrated with water and rest.  It was a pleasure to see you today!

## 2017-01-27 NOTE — Progress Notes (Signed)
Subjective:    Patient ID: [name], male    DOB: 1945-09-18, 72 y.o.   MRN: [id number]  HPI  Mr. Mathey is a 72 year old male with a history of hypertension, CAD, atrial tachycardia, alcohol abuse who presents today with a chief complaint of cough.  He also reports rhinorrhea, post nasal drip, dizziness, nausea, chills.  Postnasal drip is causing his nausea.  His symptoms have been present for the past 2 weeks. He's expelling clear mucous from his nasal cavity.  He's been Zyrtec Delsym with some improvement.  He denies chest pain, fevers, shortness of breath, abdominal pain, diarrhea.   Review of Systems  Constitutional: Positive for chills. Negative for fatigue and fever.  HENT: Positive for congestion and postnasal drip.   Respiratory: Positive for cough. Negative for shortness of breath and wheezing.   Cardiovascular: Negative for chest pain.  Gastrointestinal: Positive for nausea. Negative for abdominal pain, diarrhea and vomiting.       Past Medical History:  Diagnosis Date  . Adenomatous polyp of colon 2006  . Anemia 2012  . Anxiety   . Arthritis    "left arm/shoulder" (05/17/2016)  . Atrial tachycardia, paroxysmal ([place]) 04/07/2015  . BPH (benign prostatic hypertrophy) 5/99  . Chronic bronchitis ([place])    "q year or q other year" (11/02/2011)  . Chronic nausea    "a few days/week" (05/17/2016)  . Colon polyp   . DCM (dilated cardiomyopathy) ([place])    EF 45-50% by echo and cath 2017  . Depression    Hx of  . Falls frequently    "daily lately; legs just give out" (11/02/2011; 05/17/2016)  . GERD (gastroesophageal reflux disease) 07/31/04   gastritis   . H/O hiatal hernia   . Hard of hearing   . Heart murmur   . Hernia, umbilical    "unrepaired" (11/02/2011; 05/17/2016)  . History of blood transfusion 2012  . HLD (hyperlipidemia) 5/99  . HTN (hypertension) 5/99  . Memory loss    "recently has been forgetting alot; maybe related to the alcohol" (11/02/2011; 05/17/2016)    . Mild CAD    10% RCA  . PAC (premature atrial contraction) 04/07/2015  . Peripheral vascular disease ([place])   . Pneumonia 1996   hosp  . Pollen allergy    pollen/ragweed  . Seasonal allergies      Social History   Socioeconomic History  . Marital status: Married    Spouse name: Not on file  . Number of children: 3  . Years of education: Not on file  . Highest education level: Not on file  Social Needs  . Financial resource strain: Not on file  . Food insecurity - worry: Not on file  . Food insecurity - inability: Not on file  . Transportation needs - medical: Not on file  . Transportation needs - non-medical: Not on file  Occupational History  . Occupation: Academic librarian: [employer]  Tobacco Use  . Smoking status: Former Smoker    Types: Pipe  . Smokeless tobacco: Never Used  . Tobacco comment: 05/17/2016 "stopped in the late 1990s"  Substance and Sexual Activity  . Alcohol use: Yes    Alcohol/week: 67.2 oz    Types: 112 Glasses of wine per week    Comment: 11/02/2011 "large box of wine q 2 days" 01-27-15 2 glass a day of wine; 05/17/2016 "1.75L bottles; 1- 1 1/2 bottles/day; somedays worse than others; at least 3-4 days/week  he'll have 1 1/2 bottles; last drink was 05/16/2016" (05/17/2016)  . Drug use: No  . Sexual activity: No  Other Topics Concern  . Not on file  Social History Narrative   Married (remarried), lives with wife; 3 children, 1 at home.    [employer] - mixes colors       Pt revised designated party release form Rickie Gange, wife [phone] - can leave msg.     Allen Norris 03/04/10.        Would desire CPR.  Would not want prolonged life support if futile.    Past Surgical History:  Procedure Laterality Date  . BACK SURGERY    . CARDIAC CATHETERIZATION N/A 05/23/2015   Procedure: Left Heart Cath and Coronary Angiography;  Surgeon: Troy Sine, MD;  Location: [facility];  Service: Cardiovascular;  Laterality: N/A;   . CATARACT EXTRACTION W/ INTRAOCULAR LENS  IMPLANT, BILATERAL Bilateral 10/2002  . COLONOSCOPY  07/31/04   multiple polyps; repeat in 3 years  . DUPUYTREN CONTRACTURE RELEASE Left   . ETT myoview  03/09/07   nml EF 66%  . FRACTURE SURGERY    . HEMORRHOID SURGERY     "cauterized a long time ago" (11/02/2011)  . hosp CP R/O'D 2/24  03/08/07  . neck MRI  6/04   C5-6 disc abnormality  . ORIF HUMERUS FRACTURE  11/04/2011   Procedure: OPEN REDUCTION INTERNAL FIXATION (ORIF) PROXIMAL HUMERUS FRACTURE;  Surgeon: Rozanna Box, MD;  Location: [facility];  Service: Orthopedics;  Laterality: Left;  . POSTERIOR CERVICAL FUSION/FORAMINOTOMY N/A 03/21/2012   Procedure: POSTERIOR CERVICAL FUSION/FORAMINOTOMY LEVEL ONE;  Surgeon: Charlie Pitter, MD;  Location: [facility];  Service: Neurosurgery;  Laterality: N/A;  POSTERIOR CERVICAL ONE TO CERVICAL TWO FUSION WITH ILIAC CREST GRAFT AND LATERAL MASS SCREWS   . TONSILLECTOMY     "I was young" (11/02/2011)  . TOTAL HIP ARTHROPLASTY Left 10/24/2016   Procedure: TOTAL HIP ARTHROPLASTY ANTERIOR APPROACH;  Surgeon: Rod Can, MD;  Location: [facility];  Service: Orthopedics;  Laterality: Left;  . UPPER GASTROINTESTINAL ENDOSCOPY      Family History  Problem Relation Age of Onset  . Heart disease Father   . Diabetes Father   . Stroke Father   . Depression Mother   . Heart disease Brother   . Alcohol abuse Brother   . Liver disease Brother     Allergies  Allergen Reactions  . Nifedipine Swelling    REACTION: SWELLING, 11/02/2011 pt denies this allergy.    Current Outpatient Medications on File Prior to Visit  Medication Sig Dispense Refill  . acetaminophen-codeine (TYLENOL #3) 300-30 MG tablet Take 1 tablet by mouth every 4 (four) hours as needed for moderate pain. 20 tablet 0  . cetirizine (ZYRTEC) 10 MG tablet Take 1 tablet (10 mg total) daily by mouth. 90 tablet 3  . Cyanocobalamin (VITAMIN B-12 PO) Take 1 tablet by mouth daily.    .  cyclobenzaprine (FLEXERIL) 10 MG tablet Take 1 tablet (10 mg total) by mouth 3 (three) times daily as needed for muscle spasms. 90 tablet 0  . ferrous sulfate 325 (65 FE) MG tablet Take 325 mg by mouth daily with breakfast.    . folic acid (FOLVITE) 1 MG tablet Take 1 tablet (1 mg total) by mouth daily.    [name] lidocaine (LIDODERM) 5 % Place 1 patch onto the skin daily. Remove & Discard patch within 12 hours or as directed by MD (  Patient taking differently: Place 1 patch onto the skin daily as needed (pain). Remove & Discard patch within 12 hours or as directed by MD) 90 patch 0  . Multiple Vitamin (MULTIVITAMIN WITH MINERALS) TABS tablet Take 1 tablet by mouth daily.    [name] oxyCODONE (OXY IR/ROXICODONE) 5 MG immediate release tablet Take 1 tablet (5 mg total) by mouth every 6 (six) hours as needed for severe pain. 30 tablet 0  . pantoprazole (PROTONIX) 40 MG tablet Take 1 tablet (40 mg total) by mouth daily. 30 tablet 0  . potassium chloride SA (KLOR-CON M15) 15 MEQ tablet Take 2 tablets (30 mEq total) by mouth daily. (Patient taking differently: Take 15 mEq by mouth daily. ) 90 tablet 3  . prednisoLONE acetate (PRED FORTE) 1 % ophthalmic suspension Place 1 drop into both eyes 3 (three) times daily.    . promethazine (PHENERGAN) 25 MG tablet TAKE 1 TABLET DAILY AS NEEDED FOR NAUSEA 90 tablet 0  . QUEtiapine (SEROQUEL) 300 MG tablet Take 1 tablet (300 mg total) by mouth at bedtime. 90 tablet 1  . spironolactone (ALDACTONE) 25 MG tablet Take 1 tablet (25 mg total) daily by mouth. 90 tablet 3  . Thiamine HCl (VITAMIN B-1) 100 MG tablet Take 100 mg by mouth daily. Reported on 02/17/2015    . TOPROL XL 25 MG 24 hr tablet TAKE 1 TABLET DAILY 90 tablet 1  . traZODone (DESYREL) 50 MG tablet Take 1 tablet (50 mg total) by mouth at bedtime. 90 tablet 0  . traMADol (ULTRAM) 50 MG tablet TK 1 T PO  Q 6 H PRN P  0   No current facility-administered medications on file prior to visit.     BP 122/76   Pulse 100    Temp 98.3 F (36.8 C) (Oral)   Ht 5' 6.25" (1.683 m)   Wt 173 lb (78.5 kg)   SpO2 99%   BMI 27.71 kg/m    Objective:   Physical Exam  Constitutional: He appears well-nourished. He does not appear ill.  HENT:  Right Ear: Tympanic membrane and ear canal normal.  Left Ear: Tympanic membrane and ear canal normal.  Nose: Mucosal edema present. Right sinus exhibits no maxillary sinus tenderness and no frontal sinus tenderness. Left sinus exhibits no maxillary sinus tenderness and no frontal sinus tenderness.  Mouth/Throat: Oropharynx is clear and moist.  Eyes: Conjunctivae are normal.  Neck: Neck supple.  Cardiovascular: Normal rate and regular rhythm.  Pulmonary/Chest: Effort normal and breath sounds normal. He has no wheezes. He has no rales.  No cough during exam  Skin: Skin is warm and dry.          Assessment & Plan:  Allergic rhinitis:  Symptoms present for the past 2 weeks, no fevers. Exam today overall unremarkable, consistent for allergy involvement.  He does not appear ill, vitals within normal limits. Continue Zyrtec daily, add Flonase. Recommended he get set up with PCP for further evaluation of his chronic medical conditions. Return precautions provided.  Sheral Flow, NP

## 2017-01-28 NOTE — Addendum Note (Signed)
Addended by: Jacqualin Combes on: 01/28/2017 08:50 AM   Modules accepted: Orders

## 2017-01-31 ENCOUNTER — Other Ambulatory Visit: Payer: Self-pay

## 2017-01-31 ENCOUNTER — Ambulatory Visit (INDEPENDENT_AMBULATORY_CARE_PROVIDER_SITE_OTHER): Payer: Medicare Other | Admitting: Family Medicine

## 2017-01-31 ENCOUNTER — Encounter: Payer: Self-pay | Admitting: Family Medicine

## 2017-01-31 VITALS — BP 126/76 | HR 101 | Temp 98.2°F | Wt 173.5 lb

## 2017-01-31 DIAGNOSIS — M25552 Pain in left hip: Secondary | ICD-10-CM | POA: Diagnosis not present

## 2017-01-31 DIAGNOSIS — F1010 Alcohol abuse, uncomplicated: Secondary | ICD-10-CM | POA: Diagnosis not present

## 2017-01-31 DIAGNOSIS — D509 Iron deficiency anemia, unspecified: Secondary | ICD-10-CM

## 2017-01-31 MED ORDER — GABAPENTIN 100 MG PO CAPS: ORAL_CAPSULE | ORAL | 1 refills | Status: DC

## 2017-01-31 NOTE — Progress Notes (Signed)
Subjective:    Patient ID: [name], male    DOB: 07-23-1945, 72 y.o.   MRN: [id number]  HPI This is a 72 yo male who presents today to establish care, transferring from Dr. Deborra Medina.  He is accompanied by his son.  Continues to have a lot of aches since hip surgery. Lives with wife and son. Has chronic pain of left shoulder. Just finished PT last week. Has exercises and a stationary bike he can use at home Has chronic anemia and takes iron, makes him sick on his stomach.   Is currently drinking 4 glasses of wine a day. Had decreased his wine consumption Is interested in curbing his cravings, but not quitting.  Sleeps well at night with trazadone and seroquel.   Having to use cane to go to the mailbox. Doesn't need inside the house. No recent falls. Feels a little light headed sometimes. If he waits when getting up, feels ok.   No chest pain or SOB, some post nasal drip. No swelling of feet/ankles.   Last CPE- AWV 10/17 PSA-unknown Colonoscopy- 02/17/2015 Tdap- 07/17/2012 Flu- annual Pneumonia- 13- 2015, 23 2017 Exercise- recently finished PT   Past Medical History:  Diagnosis Date  . Adenomatous polyp of colon 2006  . Anemia 2012  . Anxiety   . Arthritis    "left arm/shoulder" (05/17/2016)  . Atrial tachycardia, paroxysmal ([place]) 04/07/2015  . BPH (benign prostatic hypertrophy) 5/99  . Chronic bronchitis ([place])    "q year or q other year" (11/02/2011)  . Chronic nausea    "a few days/week" (05/17/2016)  . Colon polyp   . DCM (dilated cardiomyopathy) ([place])    EF 45-50% by echo and cath 2017  . Depression    Hx of  . Falls frequently    "daily lately; legs just give out" (11/02/2011; 05/17/2016)  . GERD (gastroesophageal reflux disease) 07/31/04   gastritis   . H/O hiatal hernia   . Hard of hearing   . Heart murmur   . Hernia, umbilical    "unrepaired" (11/02/2011; 05/17/2016)  . History of blood transfusion 2012  . HLD (hyperlipidemia) 5/99  . HTN (hypertension) 5/99  .  Memory loss    "recently has been forgetting alot; maybe related to the alcohol" (11/02/2011; 05/17/2016)  . Mild CAD    10% RCA  . PAC (premature atrial contraction) 04/07/2015  . Peripheral vascular disease ([place])   . Pneumonia 1996   hosp  . Pollen allergy    pollen/ragweed  . Seasonal allergies    Past Surgical History:  Procedure Laterality Date  . BACK SURGERY    . CARDIAC CATHETERIZATION N/A 05/23/2015   Procedure: Left Heart Cath and Coronary Angiography;  Surgeon: Troy Sine, MD;  Location: [facility];  Service: Cardiovascular;  Laterality: N/A;  . CATARACT EXTRACTION W/ INTRAOCULAR LENS  IMPLANT, BILATERAL Bilateral 10/2002  . COLONOSCOPY  07/31/04   multiple polyps; repeat in 3 years  . DUPUYTREN CONTRACTURE RELEASE Left   . ETT myoview  03/09/07   nml EF 66%  . FRACTURE SURGERY    . HEMORRHOID SURGERY     "cauterized a long time ago" (11/02/2011)  . hosp CP R/O'D 2/24  03/08/07  . neck MRI  6/04   C5-6 disc abnormality  . ORIF HUMERUS FRACTURE  11/04/2011   Procedure: OPEN REDUCTION INTERNAL FIXATION (ORIF) PROXIMAL HUMERUS FRACTURE;  Surgeon: Rozanna Box, MD;  Location: [facility];  Service: Orthopedics;  Laterality: Left;  .  POSTERIOR CERVICAL FUSION/FORAMINOTOMY N/A 03/21/2012   Procedure: POSTERIOR CERVICAL FUSION/FORAMINOTOMY LEVEL ONE;  Surgeon: Charlie Pitter, MD;  Location: [facility];  Service: Neurosurgery;  Laterality: N/A;  POSTERIOR CERVICAL ONE TO CERVICAL TWO FUSION WITH ILIAC CREST GRAFT AND LATERAL MASS SCREWS   . TONSILLECTOMY     "I was young" (11/02/2011)  . TOTAL HIP ARTHROPLASTY Left 10/24/2016   Procedure: TOTAL HIP ARTHROPLASTY ANTERIOR APPROACH;  Surgeon: Rod Can, MD;  Location: [facility];  Service: Orthopedics;  Laterality: Left;  . UPPER GASTROINTESTINAL ENDOSCOPY     Family History  Problem Relation Age of Onset  . Heart disease Father   . Diabetes Father   . Stroke Father   . Depression Mother   . Heart disease Brother   .  Alcohol abuse Brother   . Liver disease Brother    Social History   Tobacco Use  . Smoking status: Former Smoker    Types: Pipe  . Smokeless tobacco: Never Used  . Tobacco comment: 05/17/2016 "stopped in the late 1990s"  Substance Use Topics  . Alcohol use: Yes    Alcohol/week: 67.2 oz    Types: 112 Glasses of wine per week    Comment: 11/02/2011 "large box of wine q 2 days" 01-27-15 2 glass a day of wine; 05/17/2016 "1.75L bottles; 1- 1 1/2 bottles/day; somedays worse than others; at least 3-4 days/week he'll have 1 1/2 bottles; last drink was 05/16/2016" (05/17/2016)  . Drug use: No      Review of Systems Per HPI    Objective:   Physical Exam  Constitutional: He is oriented to person, place, and time. He appears well-developed and well-nourished. No distress.  HENT:  Head: Normocephalic and atraumatic.  Eyes: Conjunctivae are normal.  Neck: Normal range of motion. Neck supple.  Cardiovascular: Normal rate, regular rhythm and normal heart sounds.  Pulmonary/Chest: Effort normal and breath sounds normal.  Neurological: He is alert and oriented to person, place, and time.  Skin: Skin is warm and dry. He is not diaphoretic.  Psychiatric: He has a normal mood and affect. His behavior is normal. Judgment and thought content normal.  Vitals reviewed.     BP 126/76   Pulse (!) 101   Temp 98.2 F (36.8 C) (Oral)   Wt 173 lb 8 oz (78.7 kg)   SpO2 98%   BMI 27.79 kg/m  Wt Readings from Last 3 Encounters:  01/31/17 173 lb 8 oz (78.7 kg)  01/27/17 173 lb (78.5 kg)  12/09/16 186 lb 8 oz (84.6 kg)       Assessment & Plan:  1. Pain of left hip joint - encouraged him to continue exercises on his own - difficult to manage his pain with current meds and alcohol overuse - discussed with Dr. Danise Mina and will try gabapentin. Discussed starting with low dose and increasing as tolerating. Provided written and verbal information regarding potential side effects.  - gabapentin  (NEURONTIN) 100 MG capsule; Please take one tablet daily with dinner x 7 days then take with breakfast and dinner  Dispense: 120 capsule; Refill: 1 - CBC with Differential/Platelet; Future - CBC with Differential/Platelet  2. Iron deficiency anemia, unspecified iron deficiency anemia type - CBC with Differential/Platelet; Future - CBC with Differential/Platelet - continue daily iron supplement  3. ETOH abuse - he is not interested in quitting as advised but would like help with cravings. Discussed stress/anxiety and pain as potentially increasing cravings. Will try gabapentin which will also hopefully help with  pain. Offered referral to psychology/psychiatry but he declined.  - gabapentin (NEURONTIN) 100 MG capsule; Please take one tablet daily with dinner x 7 days then take with breakfast and dinner  Dispense: 120 capsule; Refill: 1  - follow up in 2 months for CPE  [name], FNP-BC  [facility], [place]  02/03/2017 2:23 PM

## 2017-01-31 NOTE — Patient Instructions (Addendum)
Please check your blood pressure a couple of times a week.   Please follow up in 2 months for complete physical exam  Gabapentin capsules or tablets What is this medicine? GABAPENTIN (GA ba pen tin) is used to control partial seizures in adults with epilepsy. It is also used to treat certain types of nerve pain. This medicine may be used for other purposes; ask your health care provider or pharmacist if you have questions. COMMON BRAND NAME(S): Active-PAC with Gabapentin, Gabarone, Neurontin What should I tell my health care provider before I take this medicine? They need to know if you have any of these conditions: -kidney disease -suicidal thoughts, plans, or attempt; a previous suicide attempt by you or a family member -an unusual or allergic reaction to gabapentin, other medicines, foods, dyes, or preservatives -pregnant or trying to get pregnant -breast-feeding How should I use this medicine? Take this medicine by mouth with a glass of water. Follow the directions on the prescription label. You can take it with or without food. If it upsets your stomach, take it with food.Take your medicine at regular intervals. Do not take it more often than directed. Do not stop taking except on your doctor's advice. If you are directed to break the 600 or 800 mg tablets in half as part of your dose, the extra half tablet should be used for the next dose. If you have not used the extra half tablet within 28 days, it should be thrown away. A special MedGuide will be given to you by the pharmacist with each prescription and refill. Be sure to read this information carefully each time. Talk to your pediatrician regarding the use of this medicine in children. Special care may be needed. Overdosage: If you think you have taken too much of this medicine contact a poison control center or emergency room at once. NOTE: This medicine is only for you. Do not share this medicine with others. What if I miss a  dose? If you miss a dose, take it as soon as you can. If it is almost time for your next dose, take only that dose. Do not take double or extra doses. What may interact with this medicine? Do not take this medicine with any of the following medications: -other gabapentin products This medicine may also interact with the following medications: -alcohol -antacids -antihistamines for allergy, cough and cold -certain medicines for anxiety or sleep -certain medicines for depression or psychotic disturbances -homatropine; hydrocodone -naproxen -narcotic medicines (opiates) for pain -phenothiazines like chlorpromazine, mesoridazine, prochlorperazine, thioridazine This list may not describe all possible interactions. Give your health care provider a list of all the medicines, herbs, non-prescription drugs, or dietary supplements you use. Also tell them if you smoke, drink alcohol, or use illegal drugs. Some items may interact with your medicine. What should I watch for while using this medicine? Visit your doctor or health care professional for regular checks on your progress. You may want to keep a record at home of how you feel your condition is responding to treatment. You may want to share this information with your doctor or health care professional at each visit. You should contact your doctor or health care professional if your seizures get worse or if you have any new types of seizures. Do not stop taking this medicine or any of your seizure medicines unless instructed by your doctor or health care professional. Stopping your medicine suddenly can increase your seizures or their severity. Wear a medical identification bracelet  or chain if you are taking this medicine for seizures, and carry a card that lists all your medications. You may get drowsy, dizzy, or have blurred vision. Do not drive, use machinery, or do anything that needs mental alertness until you know how this medicine affects you.  To reduce dizzy or fainting spells, do not sit or stand up quickly, especially if you are an older patient. Alcohol can increase drowsiness and dizziness. Avoid alcoholic drinks. Your mouth may get dry. Chewing sugarless gum or sucking hard candy, and drinking plenty of water will help. The use of this medicine may increase the chance of suicidal thoughts or actions. Pay special attention to how you are responding while on this medicine. Any worsening of mood, or thoughts of suicide or dying should be reported to your health care professional right away. Women who become pregnant while using this medicine may enroll in the [place] Pregnancy Registry by calling 316-221-1001. This registry collects information about the safety of antiepileptic drug use during pregnancy. What side effects may I notice from receiving this medicine? Side effects that you should report to your doctor or health care professional as soon as possible: -allergic reactions like skin rash, itching or hives, swelling of the face, lips, or tongue -worsening of mood, thoughts or actions of suicide or dying Side effects that usually do not require medical attention (report to your doctor or health care professional if they continue or are bothersome): -constipation -difficulty walking or controlling muscle movements -dizziness -nausea -slurred speech -tiredness -tremors -weight gain This list may not describe all possible side effects. Call your doctor for medical advice about side effects. You may report side effects to FDA at 1-800-FDA-1088. Where should I keep my medicine? Keep out of reach of children. This medicine may cause accidental overdose and death if it taken by other adults, children, or pets. Mix any unused medicine with a substance like cat litter or coffee grounds. Then throw the medicine away in a sealed container like a sealed bag or a coffee can with a lid. Do not use the medicine  after the expiration date. Store at room temperature between 15 and 30 degrees C (59 and 86 degrees F). NOTE: This sheet is a summary. It may not cover all possible information. If you have questions about this medicine, talk to your doctor, pharmacist, or health care provider.  2018 Elsevier/Gold Standard (2013-02-23 15:26:50)

## 2017-02-01 ENCOUNTER — Other Ambulatory Visit (INDEPENDENT_AMBULATORY_CARE_PROVIDER_SITE_OTHER): Payer: Medicare Other

## 2017-02-01 DIAGNOSIS — D509 Iron deficiency anemia, unspecified: Secondary | ICD-10-CM | POA: Diagnosis not present

## 2017-02-01 LAB — CBC WITH DIFFERENTIAL/PLATELET
Basophils Absolute: 0.1 10*3/uL (ref 0.0–0.1)
Basophils Relative: 1.1 % (ref 0.0–3.0)
Eosinophils Absolute: 0 10*3/uL (ref 0.0–0.7)
Eosinophils Relative: 0.6 % (ref 0.0–5.0)
HCT: 47.2 % (ref 39.0–52.0)
Hemoglobin: 16 g/dL (ref 13.0–17.0)
Lymphocytes Relative: 26.6 % (ref 12.0–46.0)
Lymphs Abs: 1.4 10*3/uL (ref 0.7–4.0)
MCHC: 33.9 g/dL (ref 30.0–36.0)
MCV: 87 fl (ref 78.0–100.0)
Monocytes Absolute: 0.9 10*3/uL (ref 0.1–1.0)
Monocytes Relative: 17.9 % — ABNORMAL HIGH (ref 3.0–12.0)
Neutro Abs: 2.8 10*3/uL (ref 1.4–7.7)
Neutrophils Relative %: 53.8 % (ref 43.0–77.0)
Platelets: 160 10*3/uL (ref 150.0–400.0)
RBC: 5.43 Mil/uL (ref 4.22–5.81)
RDW: 17.9 % — ABNORMAL HIGH (ref 11.5–15.5)
WBC: 5.2 10*3/uL (ref 4.0–10.5)

## 2017-02-03 ENCOUNTER — Encounter: Payer: Self-pay | Admitting: Family Medicine

## 2017-02-04 ENCOUNTER — Telehealth: Payer: Self-pay | Admitting: Family Medicine

## 2017-02-04 ENCOUNTER — Ambulatory Visit: Payer: Self-pay

## 2017-02-04 ENCOUNTER — Other Ambulatory Visit: Payer: Self-pay | Admitting: Cardiology

## 2017-02-04 NOTE — Telephone Encounter (Signed)
Pt. Request lab results be mailed to him at home address.

## 2017-02-04 NOTE — Telephone Encounter (Signed)
  Reason for Disposition . Poor fluid intake probably caused the weakness  Answer Assessment - Initial Assessment Questions 1. DESCRIPTION: "Describe how you are feeling."     Fatigued 2. SEVERITY: "How bad is it?"  "Can you stand and walk?"   - MILD - Feels weak or tired, but does not interfere with work, school or normal activities   - [place] to stand and walk; weakness interferes with work, school, or normal activities   - SEVERE - Unable to stand or walk     Mild 3. ONSET:  "When did the weakness begin?"     Started Saturday 4. CAUSE: "What do you think is causing the weakness?"     Virus 5. MEDICINES: "Have you recently started a new medicine or had a change in the amount of a medicine?"     No - received second flu shot 6. OTHER SYMPTOMS: "Do you have any other symptoms?" (e.g., chest pain, fever, cough, SOB, vomiting, diarrhea, bleeding)     Dry cough, body aches 7. PREGNANCY: "Is there any chance you are pregnant?" "When was your last menstrual period?"     No  Protocols used: WEAKNESS (GENERALIZED) AND FATIGUE-A-AH Does not have an appetite. Will encourage po fluid intake.

## 2017-02-04 NOTE — Telephone Encounter (Signed)
Results printed and mailed as requested

## 2017-02-04 NOTE — Telephone Encounter (Signed)
Voice mailbox is full - could not leave a message.

## 2017-02-08 ENCOUNTER — Other Ambulatory Visit: Payer: Self-pay

## 2017-02-08 NOTE — Telephone Encounter (Signed)
DG-Plz see refill req/thx dmf 

## 2017-02-09 MED ORDER — QUETIAPINE FUMARATE 300 MG PO TABS: 300.0000 mg | ORAL_TABLET | Freq: Every day | ORAL | 1 refills | Status: DC

## 2017-02-16 ENCOUNTER — Other Ambulatory Visit: Payer: Self-pay | Admitting: *Deleted

## 2017-02-16 NOTE — Telephone Encounter (Signed)
Please call patient and ask him how much he is taking daily.

## 2017-02-16 NOTE — Telephone Encounter (Signed)
Last Rx 01/25/2017. Last OV 01/31/2017. pls advise

## 2017-02-17 NOTE — Telephone Encounter (Signed)
Spoke to pt who states he takes flexeril once daily

## 2017-02-18 MED ORDER — CYCLOBENZAPRINE HCL 10 MG PO TABS: 10.0000 mg | ORAL_TABLET | Freq: Every day | ORAL | 1 refills | Status: DC | PRN

## 2017-03-23 ENCOUNTER — Other Ambulatory Visit: Payer: Self-pay

## 2017-03-23 MED ORDER — PROMETHAZINE HCL 25 MG PO TABS: ORAL_TABLET | ORAL | 0 refills | Status: DC

## 2017-03-23 MED ORDER — METOPROLOL SUCCINATE ER 25 MG PO TB24: 25.0000 mg | ORAL_TABLET | Freq: Every day | ORAL | 0 refills | Status: DC

## 2017-03-23 MED ORDER — PANTOPRAZOLE SODIUM 40 MG PO TBEC: 40.0000 mg | DELAYED_RELEASE_TABLET | Freq: Every day | ORAL | 0 refills | Status: DC

## 2017-03-30 ENCOUNTER — Encounter: Payer: Self-pay | Admitting: Family Medicine

## 2017-03-30 ENCOUNTER — Ambulatory Visit (INDEPENDENT_AMBULATORY_CARE_PROVIDER_SITE_OTHER): Payer: Medicare Other | Admitting: Family Medicine

## 2017-03-30 VITALS — BP 128/68 | HR 87 | Temp 98.2°F | Ht 66.25 in | Wt 178.5 lb

## 2017-03-30 DIAGNOSIS — R42 Dizziness and giddiness: Secondary | ICD-10-CM

## 2017-03-30 DIAGNOSIS — K7030 Alcoholic cirrhosis of liver without ascites: Secondary | ICD-10-CM | POA: Diagnosis not present

## 2017-03-30 DIAGNOSIS — E7849 Other hyperlipidemia: Secondary | ICD-10-CM

## 2017-03-30 DIAGNOSIS — F1010 Alcohol abuse, uncomplicated: Secondary | ICD-10-CM | POA: Diagnosis not present

## 2017-03-30 DIAGNOSIS — M25552 Pain in left hip: Secondary | ICD-10-CM

## 2017-03-30 LAB — COMPREHENSIVE METABOLIC PANEL
ALT: 20 U/L (ref 0–53)
AST: 34 U/L (ref 0–37)
Albumin: 4.7 g/dL (ref 3.5–5.2)
Alkaline Phosphatase: 102 U/L (ref 39–117)
BILIRUBIN TOTAL: 0.8 mg/dL (ref 0.2–1.2)
BUN: 11 mg/dL (ref 6–23)
CHLORIDE: 86 meq/L — AB (ref 96–112)
CO2: 24 meq/L (ref 19–32)
CREATININE: 1.11 mg/dL (ref 0.40–1.50)
Calcium: 9.7 mg/dL (ref 8.4–10.5)
GFR: 69.48 mL/min (ref >60.00)
GLUCOSE: 103 mg/dL — AB (ref 70–99)
Potassium: 4.5 mEq/L (ref 3.5–5.1)
SODIUM: 129 meq/L — AB (ref 135–145)
Total Protein: 7.7 g/dL (ref 6.0–8.3)

## 2017-03-30 LAB — TSH: TSH: 1.52 u[IU]/mL (ref 0.35–4.50)

## 2017-03-30 NOTE — Patient Instructions (Addendum)
Please stop the furosemide  Check blood pressure twice a week, notify me of any increased swelling  Follow up with Dr. Lorelei Pont for possible shoulder injection.   Please increase gabapentin to two pills twice a day  Follow up in 3 months with me   Please ride your stationary bike and do your physical therapy exercises every day  I recommend you decrease wine to no more than 2 glasses a day

## 2017-03-30 NOTE — Progress Notes (Signed)
Subjective:    Patient ID: [name], male    DOB: [birth year], 72 y.o.   MRN: [id number]  HPI This is a 72 yo male who presents today for follow up of pain, alcohol abuse. Accompanied by his wife.    Continues to have left shoulder pain, has a plate in his shoulder. Pain all the time. Left hand dominant. Pain with movement. Has fallen 2-3 times recently. Has been having light headedness with getting up. Gait unsteady and he uses a cane.   Started gabapentin 2 months ago and has some improvement in hip and back pain.   Continues to consume 4-5 glasses of wine daily. Is not interested in quitting or reducing. Doesn't do much during the day, has exercise bike and home exercises from PT and does them about 1x per week.   Last CPE- 10/17- AWV Colonoscopy- 02/17/15 Tdap-2014 Flu-annual Dental- over due Eye- unsure Exercise- 1x per week    Review of Systems  Constitutional: Negative for fever and unexpected weight change.  HENT: Negative.   Respiratory: Negative for cough, shortness of breath and wheezing.   Cardiovascular: Positive for leg swelling (occasional at end of day, goes down overnight). Negative for chest pain.  Gastrointestinal: Negative for abdominal pain, constipation and diarrhea.  Musculoskeletal: Positive for arthralgias (shoulder, hip).  Neurological: Positive for light-headedness (occasional with position changes). Negative for dizziness and headaches.  Psychiatric/Behavioral: Negative for dysphoric mood and sleep disturbance. The patient is nervous/anxious.        Objective:   Physical Exam  Constitutional: He appears well-developed and well-nourished. No distress.  HENT:  Head: Normocephalic and atraumatic.  Mouth/Throat: Abnormal dentition. Dental caries present.  Eyes: Conjunctivae are normal.  Neck: Normal range of motion. Neck supple.  Cardiovascular: Normal rate, regular rhythm and normal heart sounds.  Pulmonary/Chest: Effort normal and breath  sounds normal.  Abdominal: Soft. Bowel sounds are normal. He exhibits no distension. There is no tenderness. There is no rebound and no guarding.  Musculoskeletal: He exhibits edema (trace pedal).  Lymphadenopathy:    He has no cervical adenopathy.  Neurological: He is alert.  Answers questions appropriately, wife helps with history.   Skin: Skin is warm and dry. He is not diaphoretic.  Psychiatric: He has a normal mood and affect. His behavior is normal.  Vitals reviewed.     BP 128/68   Pulse 87   Temp 98.2 F (36.8 C) (Oral)   Ht 5' 6.25" (1.683 m)   Wt 178 lb 8 oz (81 kg)   SpO2 97%   BMI 28.59 kg/m  Wt Readings from Last 3 Encounters:  03/30/17 178 lb 8 oz (81 kg)  01/31/17 173 lb 8 oz (78.7 kg)  01/27/17 173 lb (78.5 kg)   Depression screen [facility] 2/9 03/30/2017 07/08/2016 11/05/2015 10/24/2014  Decreased Interest 0 0 2 0  Down, Depressed, Hopeless 0 0 1 0  PHQ - 2 Score 0 0 3 0  Altered sleeping - - 0 -  Tired, decreased energy - - 2 -  Change in appetite - - 2 -  Feeling bad or failure about yourself  - - 0 -  Trouble concentrating - - 0 -  Moving slowly or fidgety/restless - - 0 -  Suicidal thoughts - - 0 -  PHQ-9 Score - - 7 -  Difficult doing work/chores - - Not difficult at all -  Some recent data might be hidden       Assessment & Plan:  1.  Alcohol abuse - Discussed consumption with patient and his wife, encouraged him to decrease but he states he is not interested at this time - Comprehensive metabolic panel  2. Alcoholic cirrhosis of liver without ascites (HCC) - Comprehensive metabolic panel  3. Other hyperlipidemia - TSH  4. Pain of left hip joint - encouraged him to increase activity level, use his stationary bike and do home PT exercises most days - will try to go up on his gabapentin to see if improvement of pain/alcohol cravings - gabapentin (NEURONTIN) 100 MG capsule; Take 2 capsules (200 mg total) by mouth 2 (two) times daily.  Dispense:  120 capsule; Refill: 1  5. ETOH abuse - gabapentin (NEURONTIN) 100 MG capsule; Take 2 capsules (200 mg total) by mouth 2 (two) times daily.  Dispense: 120 capsule; Refill: 1  6. Intermittent lightheadedness - stop furosemide, continue spironolactone, monitor for any increase in swelling, SOB - he will check home blood pressures and notify if elevated  7. Left shoulder pain - has seen Dr. Lorelei Pont in past and recommended injection once healed from hip replacement, I recommended he follow up for possible injection   [name], FNP-BC  [facility] at [facility], [place]  04/01/2017 7:48 AM

## 2017-03-31 MED ORDER — GABAPENTIN 100 MG PO CAPS: 200.0000 mg | ORAL_CAPSULE | Freq: Two times a day (BID) | ORAL | 1 refills | Status: DC

## 2017-04-01 ENCOUNTER — Encounter: Payer: Self-pay | Admitting: Family Medicine

## 2017-04-07 ENCOUNTER — Emergency Department (HOSPITAL_COMMUNITY): Payer: Medicare Other

## 2017-04-07 ENCOUNTER — Encounter: Payer: Self-pay | Admitting: Family Medicine

## 2017-04-07 ENCOUNTER — Inpatient Hospital Stay (HOSPITAL_COMMUNITY)
Admission: EM | Admit: 2017-04-07 | Discharge: 2017-04-11 | DRG: 897 | Disposition: A | Discharge status: Skilled Nursing Facility | Payer: Medicare Other | Attending: Internal Medicine | Admitting: Internal Medicine

## 2017-04-07 ENCOUNTER — Encounter: Payer: Medicare Other | Admitting: Family Medicine

## 2017-04-07 ENCOUNTER — Encounter (HOSPITAL_COMMUNITY): Payer: Self-pay

## 2017-04-07 ENCOUNTER — Telehealth: Payer: Self-pay

## 2017-04-07 ENCOUNTER — Ambulatory Visit: Payer: Medicare Other | Admitting: Family Medicine

## 2017-04-07 ENCOUNTER — Other Ambulatory Visit: Payer: Self-pay

## 2017-04-07 DIAGNOSIS — F1010 Alcohol abuse, uncomplicated: Secondary | ICD-10-CM

## 2017-04-07 DIAGNOSIS — J309 Allergic rhinitis, unspecified: Secondary | ICD-10-CM | POA: Diagnosis not present

## 2017-04-07 DIAGNOSIS — S12400A Unspecified displaced fracture of fifth cervical vertebra, initial encounter for closed fracture: Secondary | ICD-10-CM | POA: Diagnosis present

## 2017-04-07 DIAGNOSIS — F10930 Alcohol use, unspecified with withdrawal, uncomplicated: Secondary | ICD-10-CM

## 2017-04-07 DIAGNOSIS — Z79899 Other long term (current) drug therapy: Secondary | ICD-10-CM

## 2017-04-07 DIAGNOSIS — R51 Headache: Secondary | ICD-10-CM | POA: Diagnosis not present

## 2017-04-07 DIAGNOSIS — E861 Hypovolemia: Secondary | ICD-10-CM | POA: Diagnosis present

## 2017-04-07 DIAGNOSIS — I420 Dilated cardiomyopathy: Secondary | ICD-10-CM | POA: Diagnosis present

## 2017-04-07 DIAGNOSIS — F10230 Alcohol dependence with withdrawal, uncomplicated: Secondary | ICD-10-CM | POA: Diagnosis present

## 2017-04-07 DIAGNOSIS — R296 Repeated falls: Secondary | ICD-10-CM | POA: Diagnosis present

## 2017-04-07 DIAGNOSIS — Z87891 Personal history of nicotine dependence: Secondary | ICD-10-CM

## 2017-04-07 DIAGNOSIS — Z66 Do not resuscitate: Secondary | ICD-10-CM | POA: Diagnosis present

## 2017-04-07 DIAGNOSIS — E860 Dehydration: Secondary | ICD-10-CM | POA: Diagnosis present

## 2017-04-07 DIAGNOSIS — Z4789 Encounter for other orthopedic aftercare: Secondary | ICD-10-CM | POA: Diagnosis not present

## 2017-04-07 DIAGNOSIS — Z9181 History of falling: Secondary | ICD-10-CM

## 2017-04-07 DIAGNOSIS — Z888 Allergy status to other drugs, medicaments and biological substances status: Secondary | ICD-10-CM

## 2017-04-07 DIAGNOSIS — I2510 Atherosclerotic heart disease of native coronary artery without angina pectoris: Secondary | ICD-10-CM | POA: Diagnosis present

## 2017-04-07 DIAGNOSIS — E785 Hyperlipidemia, unspecified: Secondary | ICD-10-CM | POA: Diagnosis present

## 2017-04-07 DIAGNOSIS — S129XXA Fracture of neck, unspecified, initial encounter: Secondary | ICD-10-CM | POA: Diagnosis not present

## 2017-04-07 DIAGNOSIS — D6959 Other secondary thrombocytopenia: Secondary | ICD-10-CM | POA: Diagnosis present

## 2017-04-07 DIAGNOSIS — S0190XA Unspecified open wound of unspecified part of head, initial encounter: Secondary | ICD-10-CM | POA: Diagnosis not present

## 2017-04-07 DIAGNOSIS — M6281 Muscle weakness (generalized): Secondary | ICD-10-CM | POA: Diagnosis not present

## 2017-04-07 DIAGNOSIS — S12500D Unspecified displaced fracture of sixth cervical vertebra, subsequent encounter for fracture with routine healing: Secondary | ICD-10-CM | POA: Diagnosis not present

## 2017-04-07 DIAGNOSIS — I10 Essential (primary) hypertension: Secondary | ICD-10-CM | POA: Diagnosis present

## 2017-04-07 DIAGNOSIS — F419 Anxiety disorder, unspecified: Secondary | ICD-10-CM | POA: Diagnosis not present

## 2017-04-07 DIAGNOSIS — M19012 Primary osteoarthritis, left shoulder: Secondary | ICD-10-CM | POA: Diagnosis present

## 2017-04-07 DIAGNOSIS — R2681 Unsteadiness on feet: Secondary | ICD-10-CM | POA: Diagnosis not present

## 2017-04-07 DIAGNOSIS — J302 Other seasonal allergic rhinitis: Secondary | ICD-10-CM | POA: Diagnosis present

## 2017-04-07 DIAGNOSIS — N400 Enlarged prostate without lower urinary tract symptoms: Secondary | ICD-10-CM | POA: Diagnosis present

## 2017-04-07 DIAGNOSIS — G8911 Acute pain due to trauma: Secondary | ICD-10-CM | POA: Diagnosis not present

## 2017-04-07 DIAGNOSIS — D696 Thrombocytopenia, unspecified: Secondary | ICD-10-CM | POA: Diagnosis present

## 2017-04-07 DIAGNOSIS — S0101XA Laceration without foreign body of scalp, initial encounter: Secondary | ICD-10-CM

## 2017-04-07 DIAGNOSIS — S0990XA Unspecified injury of head, initial encounter: Secondary | ICD-10-CM | POA: Diagnosis not present

## 2017-04-07 DIAGNOSIS — Z811 Family history of alcohol abuse and dependence: Secondary | ICD-10-CM

## 2017-04-07 DIAGNOSIS — S199XXA Unspecified injury of neck, initial encounter: Secondary | ICD-10-CM | POA: Diagnosis not present

## 2017-04-07 DIAGNOSIS — G8929 Other chronic pain: Secondary | ICD-10-CM | POA: Diagnosis not present

## 2017-04-07 DIAGNOSIS — K7030 Alcoholic cirrhosis of liver without ascites: Secondary | ICD-10-CM | POA: Diagnosis present

## 2017-04-07 DIAGNOSIS — G4700 Insomnia, unspecified: Secondary | ICD-10-CM | POA: Diagnosis not present

## 2017-04-07 DIAGNOSIS — K219 Gastro-esophageal reflux disease without esophagitis: Secondary | ICD-10-CM | POA: Diagnosis present

## 2017-04-07 DIAGNOSIS — F1027 Alcohol dependence with alcohol-induced persisting dementia: Secondary | ICD-10-CM | POA: Diagnosis present

## 2017-04-07 DIAGNOSIS — R278 Other lack of coordination: Secondary | ICD-10-CM | POA: Diagnosis not present

## 2017-04-07 DIAGNOSIS — W19XXXA Unspecified fall, initial encounter: Secondary | ICD-10-CM | POA: Diagnosis present

## 2017-04-07 DIAGNOSIS — D649 Anemia, unspecified: Secondary | ICD-10-CM | POA: Diagnosis not present

## 2017-04-07 DIAGNOSIS — H9190 Unspecified hearing loss, unspecified ear: Secondary | ICD-10-CM | POA: Diagnosis present

## 2017-04-07 DIAGNOSIS — E871 Hypo-osmolality and hyponatremia: Secondary | ICD-10-CM | POA: Diagnosis present

## 2017-04-07 DIAGNOSIS — Z981 Arthrodesis status: Secondary | ICD-10-CM

## 2017-04-07 LAB — CBC WITH DIFFERENTIAL/PLATELET
BASOS ABS: 0 10*3/uL (ref 0.0–0.1)
Basophils Relative: 1 %
EOS PCT: 0 %
Eosinophils Absolute: 0 10*3/uL (ref 0.0–0.7)
HEMATOCRIT: 30.5 % — AB (ref 39.0–52.0)
Hemoglobin: 11 g/dL — ABNORMAL LOW (ref 13.0–17.0)
LYMPHS ABS: 0.7 10*3/uL (ref 0.7–4.0)
LYMPHS PCT: 10 %
MCH: 32.4 pg (ref 26.0–34.0)
MCHC: 36.1 g/dL — AB (ref 30.0–36.0)
MCV: 90 fL (ref 78.0–100.0)
MONOS PCT: 11 %
Monocytes Absolute: 0.7 10*3/uL (ref 0.1–1.0)
NEUTROS ABS: 5.2 10*3/uL (ref 1.7–7.7)
Neutrophils Relative %: 78 %
PLATELETS: 89 10*3/uL — AB (ref 150–400)
RBC: 3.39 MIL/uL — ABNORMAL LOW (ref 4.22–5.81)
RDW: 14.2 % (ref 11.5–15.5)
WBC: 6.6 10*3/uL (ref 4.0–10.5)

## 2017-04-07 LAB — BASIC METABOLIC PANEL
Anion gap: 21 — ABNORMAL HIGH (ref 5–15)
BUN: 7 mg/dL (ref 6–20)
CHLORIDE: 83 mmol/L — AB (ref 101–111)
CO2: 18 mmol/L — ABNORMAL LOW (ref 22–32)
CREATININE: 0.93 mg/dL (ref 0.61–1.24)
Calcium: 8.8 mg/dL — ABNORMAL LOW (ref 8.9–10.3)
GFR calc Af Amer: >60 mL/min (ref >60)
GFR calc non Af Amer: >60 mL/min (ref >60)
GLUCOSE: 103 mg/dL — AB (ref 65–99)
POTASSIUM: 4.8 mmol/L (ref 3.5–5.1)
Sodium: 122 mmol/L — ABNORMAL LOW (ref 135–145)

## 2017-04-07 LAB — ETHANOL: ALCOHOL ETHYL (B): 98 mg/dL — AB (ref <10)

## 2017-04-07 LAB — OSMOLALITY: OSMOLALITY: 262 mosm/kg — AB (ref 275–295)

## 2017-04-07 MED ORDER — PREDNISOLONE ACETATE 1 % OP SUSP: 1.0000 [drp] | Freq: Three times a day (TID) | OPHTHALMIC | Status: DC

## 2017-04-07 MED ORDER — SODIUM CHLORIDE 0.9 % IV SOLN
INTRAVENOUS | Status: DC
  Administered 2017-04-07 – 2017-04-08 (×2): via INTRAVENOUS

## 2017-04-07 MED ORDER — ENOXAPARIN SODIUM 30 MG/0.3ML ~~LOC~~ SOLN: 30.0000 mg | SUBCUTANEOUS | Status: DC

## 2017-04-07 MED ORDER — LORAZEPAM 2 MG/ML IJ SOLN: 0.0000 mg | Freq: Two times a day (BID) | INTRAMUSCULAR | Status: DC

## 2017-04-07 MED ORDER — LORAZEPAM 1 MG PO TABS
0.0000 mg | ORAL_TABLET | Freq: Two times a day (BID) | ORAL | Status: DC
  Administered 2017-04-09 – 2017-04-10 (×2): 1 mg via ORAL
  Filled 2017-04-07 (×2): qty 1

## 2017-04-07 MED ORDER — LIDOCAINE-EPINEPHRINE (PF) 2 %-1:200000 IJ SOLN
10.0000 mL | Freq: Once | INTRAMUSCULAR | Status: AC
  Administered 2017-04-07: 10 mL
  Filled 2017-04-07: qty 20

## 2017-04-07 MED ORDER — FOLIC ACID 1 MG PO TABS
1.0000 mg | ORAL_TABLET | Freq: Every day | ORAL | Status: DC
  Administered 2017-04-08 – 2017-04-11 (×4): 1 mg via ORAL
  Filled 2017-04-07 (×4): qty 1

## 2017-04-07 MED ORDER — VITAMIN B-1 100 MG PO TABS
100.0000 mg | ORAL_TABLET | Freq: Every day | ORAL | Status: DC
  Administered 2017-04-07 – 2017-04-11 (×5): 100 mg via ORAL
  Filled 2017-04-07 (×5): qty 1

## 2017-04-07 MED ORDER — SODIUM CHLORIDE 0.9 % IV BOLUS
1000.0000 mL | Freq: Once | INTRAVENOUS | Status: AC
  Administered 2017-04-07: 1000 mL via INTRAVENOUS

## 2017-04-07 MED ORDER — PANTOPRAZOLE SODIUM 40 MG PO TBEC
40.0000 mg | DELAYED_RELEASE_TABLET | Freq: Every day | ORAL | Status: DC
  Administered 2017-04-08 – 2017-04-11 (×4): 40 mg via ORAL
  Filled 2017-04-07 (×4): qty 1

## 2017-04-07 MED ORDER — METOPROLOL SUCCINATE ER 25 MG PO TB24
25.0000 mg | ORAL_TABLET | Freq: Every day | ORAL | Status: DC
Start: 2017-04-07 — End: 2017-04-11
  Administered 2017-04-08 – 2017-04-11 (×4): 25 mg via ORAL
  Filled 2017-04-07 (×4): qty 1

## 2017-04-07 MED ORDER — LORAZEPAM 1 MG PO TABS
0.0000 mg | ORAL_TABLET | Freq: Four times a day (QID) | ORAL | Status: AC
  Administered 2017-04-07: 1 mg via ORAL
  Filled 2017-04-07: qty 1

## 2017-04-07 MED ORDER — THIAMINE HCL 100 MG/ML IJ SOLN: 100.0000 mg | Freq: Every day | INTRAMUSCULAR | Status: DC

## 2017-04-07 MED ORDER — LORAZEPAM 2 MG/ML IJ SOLN
0.0000 mg | Freq: Four times a day (QID) | INTRAMUSCULAR | Status: AC
  Administered 2017-04-07: 2 mg via INTRAVENOUS
  Administered 2017-04-08: 1 mg via INTRAVENOUS
  Administered 2017-04-08: 2 mg via INTRAVENOUS
  Filled 2017-04-07 (×3): qty 1

## 2017-04-07 MED ORDER — ONDANSETRON HCL 4 MG/2ML IJ SOLN
4.0000 mg | Freq: Four times a day (QID) | INTRAMUSCULAR | Status: DC | PRN
  Administered 2017-04-07 – 2017-04-08 (×2): 4 mg via INTRAVENOUS
  Filled 2017-04-07 (×2): qty 2

## 2017-04-07 NOTE — ED Triage Notes (Signed)
5in lac on back of head. Pt fell at dr's office. Bled through 4 8x10s en route. Pt is a known alcoholic. Vitals en route 126/80  HR 102 16 RR 99% RA

## 2017-04-07 NOTE — ED Notes (Signed)
Pt voided on himself. Pt cleaned up and linen changed

## 2017-04-07 NOTE — ED Provider Notes (Signed)
[facility] DEPT Provider Note   CSN: [id number] Arrival date & time: 04/07/17  0901     History   Chief Complaint Chief Complaint  Patient presents with  . Fall  . Laceration    HPI [name] is a 72 y.o. male.  Pt presents to the ED today with a large posterior scalp laceration sustained in the parking lot of his doctor's office.  Pt has a hx of alcohol abuse and was drinking last night, per EMS.  Pt said he stood up too fast and fell down.  He denies any other injuries.  He does have a hx of frequent falls and is not on blood thinners.  Pt's family said he's been drinking alcohol only.  He has not been eating or drinking regular things.     Past Medical History:  Diagnosis Date  . Adenomatous polyp of colon 2006  . Anemia 2012  . Anxiety   . Arthritis    "left arm/shoulder" (05/17/2016)  . Atrial tachycardia, paroxysmal ([place]) 04/07/2015  . BPH (benign prostatic hypertrophy) 5/99  . Chronic bronchitis ([place])    "q year or q other year" (11/02/2011)  . Chronic nausea    "a few days/week" (05/17/2016)  . Colon polyp   . DCM (dilated cardiomyopathy) ([place])    EF 45-50% by echo and cath 2017  . Depression    Hx of  . Falls frequently    "daily lately; legs just give out" (11/02/2011; 05/17/2016)  . GERD (gastroesophageal reflux disease) 07/31/04   gastritis   . H/O hiatal hernia   . Hard of hearing   . Heart murmur   . Hernia, umbilical    "unrepaired" (11/02/2011; 05/17/2016)  . History of blood transfusion 2012  . HLD (hyperlipidemia) 5/99  . HTN (hypertension) 5/99  . Memory loss    "recently has been forgetting alot; maybe related to the alcohol" (11/02/2011; 05/17/2016)  . Mild CAD    10% RCA  . PAC (premature atrial contraction) 04/07/2015  . Peripheral vascular disease ([place])   . Pneumonia 1996   hosp  . Pollen allergy    pollen/ragweed  . Seasonal allergies     Patient Active Problem List   Diagnosis Date Noted  .  Hypomagnesemia 10/25/2016  . Hip fracture ([place]) 10/24/2016  . Displaced fracture of left femoral neck ([place]) 10/24/2016  . Thrombocytopenia ([place]) 10/24/2016  . Closed displaced fracture of left femoral neck ([place]) 10/23/2016  . Contracture of muscle of left hand 09/15/2016  . Infected laceration 07/08/2016  . Generalized muscle weakness 07/08/2016  . Anemia 05/18/2016  . Syncope and collapse   . Syncope 05/17/2016  . Cellulitis 05/17/2016  . Rib fractures 05/17/2016  . Bullous pemphigoid 05/17/2016  . ETOH abuse 03/17/2016  . Dizziness and giddiness 12/09/2015  . Chronic pain syndrome 12/09/2015  . Arthritis 11/20/2015  . Mild CAD   . DCM (dilated cardiomyopathy) ([place])   . Abnormal nuclear stress test 05/07/2015  . PAC (premature atrial contraction) 04/07/2015  . Atrial tachycardia, paroxysmal ([place]) 03/10/2015  . Depression 04/12/2012  . C2 cervical fracture ([place]) 02/29/2012  . Alcohol withdrawal ([place]) 11/06/2011  . Fracture of humerus, proximal, left, closed 11/02/2011  . Hyponatremia 11/17/2010  . H/O: upper GI bleed 10/20/2010  . Stomach ulcer from aspirin/ibuprofen-like drugs (NSAID's) 10/20/2010  . Colon polyp 08/26/2010  . Diverticulosis of colon (without mention of hemorrhage) 08/26/2010  . BPH (benign prostatic hyperplasia) 07/02/2010  . Deficiency anemia 03/09/2010  .  Hyposmolality and/or hyponatremia 10/17/2009  . ALCOHOLIC CIRRHOSIS OF LIVER 07/25/2009  . Hypokalemia 04/03/2007  . Anxiety state 01/24/2007  . HLD (hyperlipidemia) 07/28/2006  . Alcohol abuse 07/28/2006  . Essential hypertension 07/28/2006  . IMPOTENCE, ORGANIC ORIGIN 07/28/2006    Past Surgical History:  Procedure Laterality Date  . BACK SURGERY    . CARDIAC CATHETERIZATION N/A 05/23/2015   Procedure: Left Heart Cath and Coronary Angiography;  Surgeon: Troy Sine, MD;  Location: [facility];  Service: Cardiovascular;  Laterality: N/A;  . CATARACT EXTRACTION W/ INTRAOCULAR LENS   IMPLANT, BILATERAL Bilateral 10/2002  . COLONOSCOPY  07/31/04   multiple polyps; repeat in 3 years  . DUPUYTREN CONTRACTURE RELEASE Left   . ETT myoview  03/09/07   nml EF 66%  . FRACTURE SURGERY    . HEMORRHOID SURGERY     "cauterized a long time ago" (11/02/2011)  . hosp CP R/O'D 2/24  03/08/07  . neck MRI  6/04   C5-6 disc abnormality  . ORIF HUMERUS FRACTURE  11/04/2011   Procedure: OPEN REDUCTION INTERNAL FIXATION (ORIF) PROXIMAL HUMERUS FRACTURE;  Surgeon: Rozanna Box, MD;  Location: [facility];  Service: Orthopedics;  Laterality: Left;  . POSTERIOR CERVICAL FUSION/FORAMINOTOMY N/A 03/21/2012   Procedure: POSTERIOR CERVICAL FUSION/FORAMINOTOMY LEVEL ONE;  Surgeon: Charlie Pitter, MD;  Location: [facility];  Service: Neurosurgery;  Laterality: N/A;  POSTERIOR CERVICAL ONE TO CERVICAL TWO FUSION WITH ILIAC CREST GRAFT AND LATERAL MASS SCREWS   . TONSILLECTOMY     "I was young" (11/02/2011)  . TOTAL HIP ARTHROPLASTY Left 10/24/2016   Procedure: TOTAL HIP ARTHROPLASTY ANTERIOR APPROACH;  Surgeon: Rod Can, MD;  Location: [facility];  Service: Orthopedics;  Laterality: Left;  . UPPER GASTROINTESTINAL ENDOSCOPY          Home Medications    Prior to Admission medications   Medication Sig Start Date End Date Taking? Authorizing Provider  cetirizine (ZYRTEC) 10 MG tablet Take 1 tablet (10 mg total) daily by mouth. 11/30/16   Lucille Passy, MD  Cyanocobalamin (VITAMIN B-12 PO) Take 1 tablet by mouth daily.    [provider]  cyclobenzaprine (FLEXERIL) 10 MG tablet Take 1 tablet (10 mg total) by mouth daily as needed for muscle spasms. 02/18/17   Elby Beck, FNP  ferrous sulfate 325 (65 FE) MG tablet Take 325 mg by mouth daily with breakfast.    [provider]  fluticasone (FLONASE) 50 MCG/ACT nasal spray Place 1 spray into both nostrils 2 (two) times daily. 01/27/17   Pleas Koch, NP  folic acid (FOLVITE) 1 MG tablet Take 1 tablet (1 mg total) by mouth  daily. 10/31/16   Isaac Bliss, Rayford Halsted, MD  furosemide (LASIX) 40 MG tablet  02/24/17   [provider]  gabapentin (NEURONTIN) 100 MG capsule Take 2 capsules (200 mg total) by mouth 2 (two) times daily. 03/31/17   Elby Beck, FNP  lidocaine (LIDODERM) 5 % Place 1 patch onto the skin daily. Remove & Discard patch within 12 hours or as directed by MD Patient taking differently: Place 1 patch onto the skin daily as needed (pain). Remove & Discard patch within 12 hours or as directed by MD 05/11/16   Lucille Passy, MD  metoprolol succinate (TOPROL XL) 25 MG 24 hr tablet Take 1 tablet (25 mg total) by mouth daily. 03/23/17   Venia Carbon, MD  Multiple Vitamin (MULTIVITAMIN WITH MINERALS) TABS tablet Take 1 tablet by  mouth daily. 10/31/16   Isaac Bliss, Rayford Halsted, MD  pantoprazole (PROTONIX) 40 MG tablet Take 1 tablet (40 mg total) by mouth daily. 03/23/17   Venia Carbon, MD  potassium chloride SA (KLOR-CON M15) 15 MEQ tablet Take 2 tablets (30 mEq total) by mouth daily. Patient taking differently: Take 15 mEq by mouth daily.  09/15/16   Lucille Passy, MD  prednisoLONE acetate (PRED FORTE) 1 % ophthalmic suspension Place 1 drop into both eyes 3 (three) times daily.    [provider]  promethazine (PHENERGAN) 25 MG tablet TAKE 1 TABLET DAILY AS NEEDED FOR NAUSEA 03/23/17   Venia Carbon, MD  QUEtiapine (SEROQUEL) 300 MG tablet Take 1 tablet (300 mg total) by mouth at bedtime. 02/09/17   Elby Beck, FNP  spironolactone (ALDACTONE) 25 MG tablet Take 1 tablet (25 mg total) daily by mouth. 11/30/16   Lucille Passy, MD  Thiamine HCl (VITAMIN B-1) 100 MG tablet Take 100 mg by mouth daily. Reported on 02/17/2015    [provider]  traZODone (DESYREL) 50 MG tablet Take 1 tablet (50 mg total) by mouth at bedtime. 01/25/17   Lucille Passy, MD    Family History Family History  Problem Relation Age of Onset  . Heart disease Father   . Diabetes Father   .  Stroke Father   . Depression Mother   . Heart disease Brother   . Alcohol abuse Brother   . Liver disease Brother     Social History Social History   Tobacco Use  . Smoking status: Former Smoker    Types: Pipe  . Smokeless tobacco: Never Used  . Tobacco comment: 05/17/2016 "stopped in the late 1990s"  Substance Use Topics  . Alcohol use: Yes    Alcohol/week: 67.2 oz    Types: 112 Glasses of wine per week    Comment: 11/02/2011 "large box of wine q 2 days" 01-27-15 2 glass a day of wine; 05/17/2016 "1.75L bottles; 1- 1 1/2 bottles/day; somedays worse than others; at least 3-4 days/week he'll have 1 1/2 bottles; last drink was 05/16/2016" (05/17/2016)  . Drug use: No     Allergies   Nifedipine   Review of Systems Review of Systems  Skin: Positive for wound.  All other systems reviewed and are negative.    Physical Exam Updated Vital Signs BP (!) 159/94   Pulse (!) 114   Temp 98.1 F (36.7 C) (Oral)   Resp 18   Ht 5\' 9"  (1.753 m)   Wt 81.6 kg (180 lb)   SpO2 100%   BMI 26.58 kg/m   Physical Exam  Constitutional: He is oriented to person, place, and time. He appears well-developed and well-nourished.  HENT:  Head: Normocephalic.    Right Ear: External ear normal.  Left Ear: External ear normal.  Nose: Nose normal.  Mouth/Throat: Oropharynx is clear and moist.  Large hematoma under laceration  Eyes: Pupils are equal, round, and reactive to light. Conjunctivae and EOM are normal.  Neck: Normal range of motion. Neck supple.  Cardiovascular: Normal rate, regular rhythm, normal heart sounds and intact distal pulses.  Pulmonary/Chest: Effort normal and breath sounds normal.  Abdominal: Soft. Bowel sounds are normal.  Musculoskeletal: Normal range of motion.  Neurological: He is alert and oriented to person, place, and time.  Skin: Capillary refill takes less than 2 seconds.  Psychiatric: He has a normal mood and affect. His behavior is normal. Judgment and thought  content normal.  Nursing note and vitals reviewed.    ED Treatments / Results  Labs (all labs ordered are listed, but only abnormal results are displayed) Labs Reviewed  BASIC METABOLIC PANEL - Abnormal; Notable for the following components:      Result Value   Sodium 122 (*)    Chloride 83 (*)    CO2 18 (*)    Glucose, Bld 103 (*)    Calcium 8.8 (*)    Anion gap 21 (*)    All other components within normal limits  ETHANOL - Abnormal; Notable for the following components:   Alcohol, Ethyl (B) 98 (*)    All other components within normal limits  CBC WITH DIFFERENTIAL/PLATELET - Abnormal; Notable for the following components:   RBC 3.39 (*)    Hemoglobin 11.0 (*)    HCT 30.5 (*)    MCHC 36.1 (*)    Platelets 89 (*)    All other components within normal limits    EKG EKG Interpretation  Date/Time:  Thursday April 07 2017 10:05:29 EDT Ventricular Rate:  101 PR Interval:    QRS Duration: 107 QT Interval:  351 QTC Calculation: 455 R Axis:   47 Text Interpretation:  Sinus tachycardia Ventricular premature complex Aberrant conduction of SV complex(es) Anterior infarct, old Confirmed by Isla Pence [phone]) on 04/07/2017 10:11:06 AM Also confirmed by Isla Pence [phone]), editor Moshe Cipro, Jeannetta Nap [phone])  on 04/07/2017 11:06:17 AM   Radiology Ct Head Wo Contrast  Result Date: 04/07/2017 CLINICAL DATA:  Fall EXAM: CT HEAD WITHOUT CONTRAST CT CERVICAL SPINE WITHOUT CONTRAST TECHNIQUE: Multidetector CT imaging of the head and cervical spine was performed following the standard protocol without intravenous contrast. Multiplanar CT image reconstructions of the cervical spine were also generated. COMPARISON:  10/23/2016 FINDINGS: CT HEAD FINDINGS Brain: There is atrophy and chronic small vessel disease changes. No acute intracranial abnormality. Specifically, no hemorrhage, hydrocephalus, mass lesion, acute infarction, or significant intracranial injury. Vascular: No hyperdense  vessel or unexpected calcification. Skull: No acute calvarial abnormality. Sinuses/Orbits: Mucosal thickening in the left frontal sinus and ethmoid air cells. No air-fluid levels. Mastoid air cells are clear. Orbital soft tissues unremarkable. Other: Skin laceration and skin staples noted posteriorly. CT CERVICAL SPINE FINDINGS Alignment: Normal Skull base and vertebrae: Remote type 2 odontoid fracture with nonunion. Posterior cerclage wires at C1-2 and posterior fusion changes at C1-2, stable. Fracture through the spinous process at C5 appears acute. No vertebral body fracture. Soft tissues and spinal canal: No prevertebral fluid or swelling. No visible canal hematoma. Disc levels:  Mild diffuse degenerative disc and facet disease. Upper chest: No acute findings Other: No acute findings. Carotid bulb calcifications. IMPRESSION: No acute intracranial abnormality. Atrophy, chronic microvascular disease. Remote odontoid fracture with nonunion. Posterior fusion changes at C1-2. Findings are stable. Acute appearing fracture through the C5 spinous process. Electronically Signed   By: Rolm Baptise M.D.   On: 04/07/2017 11:20   Ct Cervical Spine Wo Contrast  Result Date: 04/07/2017 CLINICAL DATA:  Fall EXAM: CT HEAD WITHOUT CONTRAST CT CERVICAL SPINE WITHOUT CONTRAST TECHNIQUE: Multidetector CT imaging of the head and cervical spine was performed following the standard protocol without intravenous contrast. Multiplanar CT image reconstructions of the cervical spine were also generated. COMPARISON:  10/23/2016 FINDINGS: CT HEAD FINDINGS Brain: There is atrophy and chronic small vessel disease changes. No acute intracranial abnormality. Specifically, no hemorrhage, hydrocephalus, mass lesion, acute infarction, or significant intracranial injury. Vascular: No hyperdense vessel or unexpected calcification. Skull: No acute calvarial abnormality. Sinuses/Orbits:  Mucosal thickening in the left frontal sinus and ethmoid air  cells. No air-fluid levels. Mastoid air cells are clear. Orbital soft tissues unremarkable. Other: Skin laceration and skin staples noted posteriorly. CT CERVICAL SPINE FINDINGS Alignment: Normal Skull base and vertebrae: Remote type 2 odontoid fracture with nonunion. Posterior cerclage wires at C1-2 and posterior fusion changes at C1-2, stable. Fracture through the spinous process at C5 appears acute. No vertebral body fracture. Soft tissues and spinal canal: No prevertebral fluid or swelling. No visible canal hematoma. Disc levels:  Mild diffuse degenerative disc and facet disease. Upper chest: No acute findings Other: No acute findings. Carotid bulb calcifications. IMPRESSION: No acute intracranial abnormality. Atrophy, chronic microvascular disease. Remote odontoid fracture with nonunion. Posterior fusion changes at C1-2. Findings are stable. Acute appearing fracture through the C5 spinous process. Electronically Signed   By: Rolm Baptise M.D.   On: 04/07/2017 11:20    Procedures .Marland KitchenLaceration Repair Date/Time: 04/07/2017 9:43 AM Performed by: Isla Pence, MD Authorized by: Isla Pence, MD   Consent:    Consent obtained:  Verbal   Consent given by:  Patient   Risks discussed:  Pain   Alternatives discussed:  No treatment Anesthesia (see MAR for exact dosages):    Anesthesia method:  Local infiltration   Local anesthetic:  Lidocaine 2% WITH epi Laceration details:    Location:  Scalp   Scalp location:  Occipital   Length (cm):  6 Repair type:    Repair type:  Simple Pre-procedure details:    Preparation:  Patient was prepped and draped in usual sterile fashion Exploration:    Hemostasis achieved with:  Epinephrine   Contaminated: no   Treatment:    Area cleansed with:  Betadine   Amount of cleaning:  Standard   Irrigation solution:  Sterile saline   Irrigation method:  Tap Skin repair:    Repair method:  Staples   Number of staples:  12 Approximation:    Approximation:   Close Post-procedure details:    Patient tolerance of procedure:  Tolerated well, no immediate complications Comments:     Wound was still oozing, so quik clot was also applied.   (including critical care time)  Medications Ordered in ED Medications  LORazepam (ATIVAN) injection 0-4 mg ( Intravenous See Alternative 04/07/17 1204)    Or  LORazepam (ATIVAN) tablet 0-4 mg (1 mg Oral Given 04/07/17 1204)  LORazepam (ATIVAN) injection 0-4 mg (has no administration in time range)    Or  LORazepam (ATIVAN) tablet 0-4 mg (has no administration in time range)  thiamine (VITAMIN B-1) tablet 100 mg (100 mg Oral Given 04/07/17 1204)    Or  thiamine (B-1) injection 100 mg ( Intravenous See Alternative 04/07/17 1204)  lidocaine-EPINEPHrine (XYLOCAINE W/EPI) 2 %-1:200000 (PF) injection 10 mL (10 mLs Infiltration Given 04/07/17 0929)  sodium chloride 0.9 % bolus 1,000 mL (1,000 mLs Intravenous New Bag/Given 04/07/17 0954)     Initial Impression / Assessment and Plan / ED Course  I have reviewed the triage vital signs and the nursing notes.  Pertinent labs & imaging results that were available during my care of the patient were reviewed by me and considered in my medical decision making (see chart for details).  Pt d/w NS who recommends flexion/extention films to make sure fracture is stable.  Soft collar as needed for comfort.  F/u as outpatient as needed.  I have not ordered the f/e films yet as he's very shaky and he needs to calm down first.  They can be done as an inpatient.   Pt's sodium has dropped from previous (129 on 3/20).  Plt also dropped from previous (160).  This is likely from his etoh binge.  The pt is starting to become more shaky and was started on the CIWA protocol and given ativan.  The pt d/w Dr. Rodena Piety (triad) for admission.  Final Clinical Impressions(s) / ED Diagnoses   Final diagnoses:  Laceration of scalp, initial encounter  Closed fracture of spinous process of cervical  vertebra, initial encounter ([place])  Hyponatremia  Alcohol abuse  Thrombocytopenia (HCC)  Alcohol withdrawal syndrome without complication [facility])    ED Discharge Orders    None       Isla Pence, MD 04/07/17 1223

## 2017-04-07 NOTE — Telephone Encounter (Signed)
Pt arrived to our facility between 7:30 and 7:45 this morning for an OV with Dr. Lorelei Pont.

## 2017-04-07 NOTE — H&P (Signed)
History and Physical    [name]:[id number] DOB: 1945/03/12 DOA: 04/07/2017  PCP: Elby Beck, FNP Patient coming from: Home  Chief Complaint: Fall  HPI: [name] is a 72 y.o. male with medical history significant of alcohol abuse, hypertension, dilated cardiomyopathy last echo 05/2016 ejection fraction 55-60%, memory  loss probably related to alcohol patient was on his way to see Dr. Edilia Bo his PCP while he was getting down from his car he did not use his walker and he stood up felt lightheaded and fell backwards and hit the back of his head and suffered a 5 inch laceration which has been stapled in the ER.  Patient has decreased p.o. intake along with decreased appetite family reported to the staff that patient has been only drinking alcohol and nothing to eat or drink.  His last drink was last night.  When I saw him there was no family in the room.  He was awake but was not able to give me a good history.  He said he is supposed to walk with a cane or a walker but he does not use them.  He denied chest pain shortness of breath loss of consciousness nausea vomiting or diarrhea.  Patient denies any pain to his neck he was able to move his head up and down and to the sides without any pain.  He denied fever or chills.  He denied any urinary complaints he lives at home with his wife.    ED Course: Patient was started on [place] protocol, staples were placed on the laceration in the back of his head, CT scan of the C-spine and the head was ordered and that showsNo acute intracranial abnormality.  He was found to have a sodium of 122 potassium of 4.8 BUN of 7 creatinine 0.93 calcium 8.8 his anion gap was 21 with a CO2 of 18.  His white count was 6.6 hemoglobin 11.0 platelet count 89.  TSH was 1.52 from 03/30/2017.  Alcohol level was 98 urine drug screen was negative.  Atrophy, chronic microvascular disease.  Remote odontoid fracture with nonunion. Posterior fusion changes  at C1-2. Findings are stable.  Acute appearing fracture through the C5 spinous process.  ED physician discussed with neurosurgery on call and advised to have the patient on soft collar if patient is symptomatic.   Review of Systems: As per HPI otherwise all other systems reviewed and are negative  Past Medical History:  Diagnosis Date  . Adenomatous polyp of colon 2006  . Anemia 2012  . Anxiety   . Arthritis    "left arm/shoulder" (05/17/2016)  . Atrial tachycardia, paroxysmal ([place]) 04/07/2015  . BPH (benign prostatic hypertrophy) 5/99  . Chronic bronchitis ([place])    "q year or q other year" (11/02/2011)  . Chronic nausea    "a few days/week" (05/17/2016)  . Colon polyp   . DCM (dilated cardiomyopathy) ([place])    EF 45-50% by echo and cath 2017  . Depression    Hx of  . Falls frequently    "daily lately; legs just give out" (11/02/2011; 05/17/2016)  . GERD (gastroesophageal reflux disease) 07/31/04   gastritis   . H/O hiatal hernia   . Hard of hearing   . Heart murmur   . Hernia, umbilical    "unrepaired" (11/02/2011; 05/17/2016)  . History of blood transfusion 2012  . HLD (hyperlipidemia) 5/99  . HTN (hypertension) 5/99  . Memory loss    "recently has been forgetting alot; maybe  related to the alcohol" (11/02/2011; 05/17/2016)  . Mild CAD    10% RCA  . PAC (premature atrial contraction) 04/07/2015  . Peripheral vascular disease ([place])   . Pneumonia 1996   hosp  . Pollen allergy    pollen/ragweed  . Seasonal allergies     Past Surgical History:  Procedure Laterality Date  . BACK SURGERY    . CARDIAC CATHETERIZATION N/A 05/23/2015   Procedure: Left Heart Cath and Coronary Angiography;  Surgeon: Troy Sine, MD;  Location: [facility];  Service: Cardiovascular;  Laterality: N/A;  . CATARACT EXTRACTION W/ INTRAOCULAR LENS  IMPLANT, BILATERAL Bilateral 10/2002  . COLONOSCOPY  07/31/04   multiple polyps; repeat in 3 years  . DUPUYTREN CONTRACTURE RELEASE Left   .  ETT myoview  03/09/07   nml EF 66%  . FRACTURE SURGERY    . HEMORRHOID SURGERY     "cauterized a long time ago" (11/02/2011)  . hosp CP R/O'D 2/24  03/08/07  . neck MRI  6/04   C5-6 disc abnormality  . ORIF HUMERUS FRACTURE  11/04/2011   Procedure: OPEN REDUCTION INTERNAL FIXATION (ORIF) PROXIMAL HUMERUS FRACTURE;  Surgeon: Rozanna Box, MD;  Location: [facility];  Service: Orthopedics;  Laterality: Left;  . POSTERIOR CERVICAL FUSION/FORAMINOTOMY N/A 03/21/2012   Procedure: POSTERIOR CERVICAL FUSION/FORAMINOTOMY LEVEL ONE;  Surgeon: Charlie Pitter, MD;  Location: [facility];  Service: Neurosurgery;  Laterality: N/A;  POSTERIOR CERVICAL ONE TO CERVICAL TWO FUSION WITH ILIAC CREST GRAFT AND LATERAL MASS SCREWS   . TONSILLECTOMY     "I was young" (11/02/2011)  . TOTAL HIP ARTHROPLASTY Left 10/24/2016   Procedure: TOTAL HIP ARTHROPLASTY ANTERIOR APPROACH;  Surgeon: Rod Can, MD;  Location: [facility];  Service: Orthopedics;  Laterality: Left;  . UPPER GASTROINTESTINAL ENDOSCOPY      Social History   Socioeconomic History  . Marital status: Married    Spouse name: Not on file  . Number of children: 3  . Years of education: Not on file  . Highest education level: Not on file  Occupational History  . Occupation: Academic librarian: [employer]  Social Needs  . Financial resource strain: Not on file  . Food insecurity:    Worry: Not on file    Inability: Not on file  . Transportation needs:    Medical: Not on file    Non-medical: Not on file  Tobacco Use  . Smoking status: Former Smoker    Types: Pipe  . Smokeless tobacco: Never Used  . Tobacco comment: 05/17/2016 "stopped in the late 1990s"  Substance and Sexual Activity  . Alcohol use: Yes    Alcohol/week: 67.2 oz    Types: 112 Glasses of wine per week    Comment: 11/02/2011 "large box of wine q 2 days" 01-27-15 2 glass a day of wine; 05/17/2016 "1.75L bottles; 1- 1 1/2 bottles/day; somedays worse than others; at least  3-4 days/week he'll have 1 1/2 bottles; last drink was 05/16/2016" (05/17/2016)  . Drug use: No  . Sexual activity: Never  Lifestyle  . Physical activity:    Days per week: Not on file    Minutes per session: Not on file  . Stress: Not on file  Relationships  . Social connections:    Talks on phone: Not on file    Gets together: Not on file    Attends religious service: Not on file    Active member of club or organization:  Not on file    Attends meetings of clubs or organizations: Not on file    Relationship status: Not on file  . Intimate partner violence:    Fear of current or ex partner: Not on file    Emotionally abused: Not on file    Physically abused: Not on file    Forced sexual activity: Not on file  Other Topics Concern  . Not on file  Social History Narrative   Married (remarried), lives with wife; 3 children, 1 at home.    [employer] - mixes colors       Pt revised designated party release form Damir Leung, wife [phone] - can leave msg.     Allen Norris 03/04/10.        Would desire CPR.  Would not want prolonged life support if futile.    Allergies  Allergen Reactions  . Nifedipine Swelling    REACTION: SWELLING, 11/02/2011 pt denies this allergy.    Family History  Problem Relation Age of Onset  . Heart disease Father   . Diabetes Father   . Stroke Father   . Depression Mother   . Heart disease Brother   . Alcohol abuse Brother   . Liver disease Brother       Prior to Admission medications   Medication Sig Start Date End Date Taking? Authorizing Provider  cetirizine (ZYRTEC) 10 MG tablet Take 1 tablet (10 mg total) daily by mouth. 11/30/16   Lucille Passy, MD  Cyanocobalamin (VITAMIN B-12 PO) Take 1 tablet by mouth daily.    [provider]  cyclobenzaprine (FLEXERIL) 10 MG tablet Take 1 tablet (10 mg total) by mouth daily as needed for muscle spasms. 02/18/17   Elby Beck, FNP  ferrous sulfate 325 (65 FE) MG tablet Take 325  mg by mouth daily with breakfast.    [provider]  fluticasone (FLONASE) 50 MCG/ACT nasal spray Place 1 spray into both nostrils 2 (two) times daily. 01/27/17   Pleas Koch, NP  folic acid (FOLVITE) 1 MG tablet Take 1 tablet (1 mg total) by mouth daily. 10/31/16   Isaac Bliss, Rayford Halsted, MD  furosemide (LASIX) 40 MG tablet  02/24/17   [provider]  gabapentin (NEURONTIN) 100 MG capsule Take 2 capsules (200 mg total) by mouth 2 (two) times daily. 03/31/17   Elby Beck, FNP  lidocaine (LIDODERM) 5 % Place 1 patch onto the skin daily. Remove & Discard patch within 12 hours or as directed by MD Patient taking differently: Place 1 patch onto the skin daily as needed (pain). Remove & Discard patch within 12 hours or as directed by MD 05/11/16   Lucille Passy, MD  metoprolol succinate (TOPROL XL) 25 MG 24 hr tablet Take 1 tablet (25 mg total) by mouth daily. 03/23/17   Venia Carbon, MD  Multiple Vitamin (MULTIVITAMIN WITH MINERALS) TABS tablet Take 1 tablet by mouth daily. 10/31/16   Isaac Bliss, Rayford Halsted, MD  pantoprazole (PROTONIX) 40 MG tablet Take 1 tablet (40 mg total) by mouth daily. 03/23/17   Venia Carbon, MD  potassium chloride SA (KLOR-CON M15) 15 MEQ tablet Take 2 tablets (30 mEq total) by mouth daily. Patient taking differently: Take 15 mEq by mouth daily.  09/15/16   Lucille Passy, MD  prednisoLONE acetate (PRED FORTE) 1 % ophthalmic suspension Place 1 drop into both eyes 3 (three) times daily.    [provider]  promethazine (  PHENERGAN) 25 MG tablet TAKE 1 TABLET DAILY AS NEEDED FOR NAUSEA 03/23/17   Venia Carbon, MD  QUEtiapine (SEROQUEL) 300 MG tablet Take 1 tablet (300 mg total) by mouth at bedtime. 02/09/17   Elby Beck, FNP  spironolactone (ALDACTONE) 25 MG tablet Take 1 tablet (25 mg total) daily by mouth. 11/30/16   Lucille Passy, MD  Thiamine HCl (VITAMIN B-1) 100 MG tablet Take 100 mg by mouth daily. Reported on  02/17/2015    [provider]  traZODone (DESYREL) 50 MG tablet Take 1 tablet (50 mg total) by mouth at bedtime. 01/25/17   Lucille Passy, MD    Physical Exam: Vitals:   04/07/17 1130 04/07/17 1159 04/07/17 1200 04/07/17 1230  BP: (!) 143/91 (!) 143/91 (!) 159/94 (!) 121/91  Pulse: (!) 106 (!) 118 (!) 114 (!) 112  Resp: (!) 30  18 (!) 35  Temp:      TempSrc:      SpO2: 100%  100% 100%  Weight:      Height:         General:  Appears calm and comfortable Eyes:  PERRL, EOMI, normal lids, iris ENT:  grossly normal hearing, lips & tongue, mmm Neck:  no LAD, masses or thyromegaly Cardiovascular:  RRR, no m/r/g. No LE edema.  Respiratory: CTA bilaterally, no w/r/r. Normal respiratory effort. Abdomen:  soft, ntnd, NABS Skin: 5 cm laceration on the back of his head which is stapled.   Musculoskeletal:  grossly normal tone BUE/BLE, good ROM, no bony abnormality Psychiatric:  grossly normal mood and affect, speech fluent and appropriate, AOx3 Neuro he is awake and oriented to the hospital.  He moves all his extremities and follows commands.   Labs on Admission: I have personally reviewed following labs and imaging studies  CBC: Recent Labs  Lab 04/07/17 0956  WBC 6.6  NEUTROABS 5.2  HGB 11.0*  HCT 30.5*  MCV 90.0  PLT 89*   Basic Metabolic Panel: Recent Labs  Lab 04/07/17 0956  NA 122*  K 4.8  CL 83*  CO2 18*  GLUCOSE 103*  BUN 7  CREATININE 0.93  CALCIUM 8.8*   GFR: Estimated Creatinine Clearance: 73.9 mL/min (by C-G formula based on SCr of 0.93 mg/dL). Liver Function Tests: No results for input(s): AST, ALT, ALKPHOS, BILITOT, PROT, ALBUMIN in the last 168 hours. No results for input(s): LIPASE, AMYLASE in the last 168 hours. No results for input(s): AMMONIA in the last 168 hours. Coagulation Profile: No results for input(s): INR, PROTIME in the last 168 hours. Cardiac Enzymes: No results for input(s): CKTOTAL, CKMB, CKMBINDEX, TROPONINI in the last 168  hours. BNP (last 3 results) No results for input(s): PROBNP in the last 8760 hours. HbA1C: No results for input(s): HGBA1C in the last 72 hours. CBG: No results for input(s): GLUCAP in the last 168 hours. Lipid Profile: No results for input(s): CHOL, HDL, LDLCALC, TRIG, CHOLHDL, LDLDIRECT in the last 72 hours. Thyroid Function Tests: No results for input(s): TSH, T4TOTAL, FREET4, T3FREE, THYROIDAB in the last 72 hours. Anemia Panel: No results for input(s): VITAMINB12, FOLATE, FERRITIN, TIBC, IRON, RETICCTPCT in the last 72 hours. Urine analysis:    Component Value Date/Time   COLORURINE YELLOW 05/17/2016 [place] 05/17/2016 1416   LABSPEC 1.017 05/17/2016 1416   PHURINE 7.0 05/17/2016 1416   GLUCOSEU NEGATIVE 05/17/2016 1416   [place] 05/17/2016 1416   [place] 05/17/2016 1416   BILIRUBINUR neg 11/17/2010 1210  KETONESUR NEGATIVE 05/17/2016 1416   PROTEINUR NEGATIVE 05/17/2016 1416   UROBILINOGEN 1.0 11/17/2011 1451   NITRITE NEGATIVE 05/17/2016 1416   LEUKOCYTESUR NEGATIVE 05/17/2016 1416    Creatinine Clearance: Estimated Creatinine Clearance: 73.9 mL/min (by C-G formula based on SCr of 0.93 mg/dL).  Sepsis Labs: @LABRCNTIP (procalcitonin:4,lacticidven:4) )No results found for this or any previous visit (from the past 240 hour(s)).   Radiological Exams on Admission: Ct Head Wo Contrast  Result Date: 04/07/2017 CLINICAL DATA:  Fall EXAM: CT HEAD WITHOUT CONTRAST CT CERVICAL SPINE WITHOUT CONTRAST TECHNIQUE: Multidetector CT imaging of the head and cervical spine was performed following the standard protocol without intravenous contrast. Multiplanar CT image reconstructions of the cervical spine were also generated. COMPARISON:  10/23/2016 FINDINGS: CT HEAD FINDINGS Brain: There is atrophy and chronic small vessel disease changes. No acute intracranial abnormality. Specifically, no hemorrhage, hydrocephalus, mass lesion, acute infarction,  or significant intracranial injury. Vascular: No hyperdense vessel or unexpected calcification. Skull: No acute calvarial abnormality. Sinuses/Orbits: Mucosal thickening in the left frontal sinus and ethmoid air cells. No air-fluid levels. Mastoid air cells are clear. Orbital soft tissues unremarkable. Other: Skin laceration and skin staples noted posteriorly. CT CERVICAL SPINE FINDINGS Alignment: Normal Skull base and vertebrae: Remote type 2 odontoid fracture with nonunion. Posterior cerclage wires at C1-2 and posterior fusion changes at C1-2, stable. Fracture through the spinous process at C5 appears acute. No vertebral body fracture. Soft tissues and spinal canal: No prevertebral fluid or swelling. No visible canal hematoma. Disc levels:  Mild diffuse degenerative disc and facet disease. Upper chest: No acute findings Other: No acute findings. Carotid bulb calcifications. IMPRESSION: No acute intracranial abnormality. Atrophy, chronic microvascular disease. Remote odontoid fracture with nonunion. Posterior fusion changes at C1-2. Findings are stable. Acute appearing fracture through the C5 spinous process. Electronically Signed   By: Rolm Baptise M.D.   On: 04/07/2017 11:20   Ct Cervical Spine Wo Contrast  Result Date: 04/07/2017 CLINICAL DATA:  Fall EXAM: CT HEAD WITHOUT CONTRAST CT CERVICAL SPINE WITHOUT CONTRAST TECHNIQUE: Multidetector CT imaging of the head and cervical spine was performed following the standard protocol without intravenous contrast. Multiplanar CT image reconstructions of the cervical spine were also generated. COMPARISON:  10/23/2016 FINDINGS: CT HEAD FINDINGS Brain: There is atrophy and chronic small vessel disease changes. No acute intracranial abnormality. Specifically, no hemorrhage, hydrocephalus, mass lesion, acute infarction, or significant intracranial injury. Vascular: No hyperdense vessel or unexpected calcification. Skull: No acute calvarial abnormality. Sinuses/Orbits:  Mucosal thickening in the left frontal sinus and ethmoid air cells. No air-fluid levels. Mastoid air cells are clear. Orbital soft tissues unremarkable. Other: Skin laceration and skin staples noted posteriorly. CT CERVICAL SPINE FINDINGS Alignment: Normal Skull base and vertebrae: Remote type 2 odontoid fracture with nonunion. Posterior cerclage wires at C1-2 and posterior fusion changes at C1-2, stable. Fracture through the spinous process at C5 appears acute. No vertebral body fracture. Soft tissues and spinal canal: No prevertebral fluid or swelling. No visible canal hematoma. Disc levels:  Mild diffuse degenerative disc and facet disease. Upper chest: No acute findings Other: No acute findings. Carotid bulb calcifications. IMPRESSION: No acute intracranial abnormality. Atrophy, chronic microvascular disease. Remote odontoid fracture with nonunion. Posterior fusion changes at C1-2. Findings are stable. Acute appearing fracture through the C5 spinous process. Electronically Signed   By: Rolm Baptise M.D.   On: 04/07/2017 11:20    EKG: Independently reviewed.   Assessment/Plan Active Problems:   Hyponatremia   1] status post fall with a  laceration to the back of his head while he was standing up from a sitting position trying to get out of the car sounds more orthostatic.  Patient also has been drinking alcohol constantly without eating or drinking any other food.  His sodium was found to be 122 which could add to this fall and unstable gait.  Patient does take Seroquel 300 mg nightly which will be on hold.  2] alcohol abuse watch for signs of withdrawal put him on ciwa protocol.  3] hyponatremia hypovolemic with elevated anion gap-with a sodium of 122 multifactorial secondary to decreased p.o. intake and alcohol abuse.  Will treat him with IV fluids normal saline follow-up labs tomorrow.  He has had the same issue in the past.  Check urine and serum osmolality and urine creatinine.  TSH was normal.   Patient also takes Lasix and Aldactone at home which will be on hold since he is dehydrated.  4] thrombocytopenia secondary to alcohol abuse there is no documented cirrhosis.  Of the liver noted yet.  5] C5 spinous process fracture treated nonsurgically ED physician spoke to neurosurgeon on call recommended soft collar if patient has symptoms.  6] recurrent falls generalized weakness decreased p.o. intake will obtain PT OT evaluation post patient will most likely need rehab prior to going home.  DVT prophylaxis: SCD in view of thrombocytopenia I have not put him on Lovenox. Code Status: DO NOT RESUSCITATE Family Communication: No family available Disposition Plan: TBD Consults called: None Admission status: Inpatient   Georgette Shell MD [facility]  If 7PM-7AM, please contact night-coverage [email or web address] Password [facility]  04/07/2017, 12:59 PM

## 2017-04-07 NOTE — Telephone Encounter (Signed)
Seen before 8am.  Suffered fall while getting out of car, was not using cane, felt dizzy, and fell backwards onto asphalt with resultant ~6cm laceration that bled through several gauze pads in office.  Given ongoing bleeding, I advised ER evaluation - EMS was called for ambulance transportation.  Spoke with pt, son and wife over the phone who states he has not been doing well at home, poor PO intake, but is drinking a large amt alcohol.

## 2017-04-07 NOTE — ED Notes (Signed)
Pt requested bedside commode for BM. Pt advised to use bedpan. Pt refused. Yellow socks placed on pt. Pt assisted onto bedside commode with the help of Wirt, EMT. Pt advised to use call light when through. Pt advised to not stand up. Reinforced to pt 3x.

## 2017-04-08 DIAGNOSIS — D696 Thrombocytopenia, unspecified: Secondary | ICD-10-CM

## 2017-04-08 DIAGNOSIS — F10230 Alcohol dependence with withdrawal, uncomplicated: Principal | ICD-10-CM

## 2017-04-08 DIAGNOSIS — S0101XA Laceration without foreign body of scalp, initial encounter: Secondary | ICD-10-CM

## 2017-04-08 LAB — COMPREHENSIVE METABOLIC PANEL
ALT: 22 U/L (ref 17–63)
ANION GAP: 8 (ref 5–15)
AST: 33 U/L (ref 15–41)
Albumin: 3.6 g/dL (ref 3.5–5.0)
Alkaline Phosphatase: 83 U/L (ref 38–126)
BUN: 7 mg/dL (ref 6–20)
CHLORIDE: 94 mmol/L — AB (ref 101–111)
CO2: 25 mmol/L (ref 22–32)
Calcium: 8.4 mg/dL — ABNORMAL LOW (ref 8.9–10.3)
Creatinine, Ser: 0.85 mg/dL (ref 0.61–1.24)
GFR calc Af Amer: >60 mL/min (ref >60)
GFR calc non Af Amer: >60 mL/min (ref >60)
Glucose, Bld: 112 mg/dL — ABNORMAL HIGH (ref 65–99)
POTASSIUM: 4 mmol/L (ref 3.5–5.1)
SODIUM: 127 mmol/L — AB (ref 135–145)
Total Bilirubin: 0.9 mg/dL (ref 0.3–1.2)
Total Protein: 6 g/dL — ABNORMAL LOW (ref 6.5–8.1)

## 2017-04-08 LAB — CBC
HCT: 25.9 % — ABNORMAL LOW (ref 39.0–52.0)
HEMOGLOBIN: 9.2 g/dL — AB (ref 13.0–17.0)
MCH: 31.9 pg (ref 26.0–34.0)
MCHC: 35.5 g/dL (ref 30.0–36.0)
MCV: 89.9 fL (ref 78.0–100.0)
Platelets: 75 10*3/uL — ABNORMAL LOW (ref 150–400)
RBC: 2.88 MIL/uL — AB (ref 4.22–5.81)
RDW: 14.5 % (ref 11.5–15.5)
WBC: 4.5 10*3/uL (ref 4.0–10.5)

## 2017-04-08 LAB — OSMOLALITY, URINE: Osmolality, Ur: 187 mOsm/kg — ABNORMAL LOW (ref 300–900)

## 2017-04-08 LAB — NA AND K (SODIUM & POTASSIUM), RAND UR
POTASSIUM UR: 21 mmol/L
Sodium, Ur: 51 mmol/L

## 2017-04-08 MED ORDER — OXYCODONE HCL 5 MG PO TABS
5.0000 mg | ORAL_TABLET | Freq: Four times a day (QID) | ORAL | Status: DC | PRN
  Administered 2017-04-08 – 2017-04-09 (×3): 5 mg via ORAL
  Filled 2017-04-08 (×3): qty 1

## 2017-04-08 MED ORDER — GABAPENTIN 100 MG PO CAPS
200.0000 mg | ORAL_CAPSULE | Freq: Two times a day (BID) | ORAL | Status: DC
  Administered 2017-04-08 – 2017-04-11 (×7): 200 mg via ORAL
  Filled 2017-04-08 (×7): qty 2

## 2017-04-08 MED ORDER — CYCLOBENZAPRINE HCL 5 MG PO TABS
5.0000 mg | ORAL_TABLET | Freq: Once | ORAL | Status: AC
  Administered 2017-04-08: 5 mg via ORAL
  Filled 2017-04-08: qty 1

## 2017-04-08 MED ORDER — ADULT MULTIVITAMIN W/MINERALS CH
1.0000 | ORAL_TABLET | Freq: Every day | ORAL | Status: DC
  Administered 2017-04-08 – 2017-04-11 (×4): 1 via ORAL
  Filled 2017-04-08 (×4): qty 1

## 2017-04-08 NOTE — Progress Notes (Signed)
Been notified by telemetry that pt's heart rate will sporadically jump from mid-90s to 140s or 150s and go back WNL after a couple of beats. Palpated pulse and heart rate is irregular. Notified floor coverage. Will continue to monitor and reassess.

## 2017-04-08 NOTE — Progress Notes (Signed)
OT Cancellation Note  Patient Details Name: [name] MRN: [id number] DOB: 1945/03/15   Cancelled Treatment:    Reason Eval/Treat Not Completed: Other (comment)  Not ready per RN. Pt is lightheaded and very shaky.    [place] 04/08/2017, 1:32 PM  Lesle Chris, OTR/L [phone] 04/08/2017

## 2017-04-08 NOTE — Progress Notes (Signed)
PROGRESS NOTE    JAHKEEM KURKA  ZOX:[id number] DOB: 07-Feb-1945 DOA: 04/07/2017 PCP: Elby Beck, FNP   Brief Narrative: 72 year old male with history of severe alcohol abuse, hypertension, dilated cardiomyopathy last echo in May 2018 with EF of 55-60%, cognitive decline presented after a fall sustaining scalp laceration which was stapled in the ER.  Patient was found to have positive alcohol, hyponatremia.  He serum alcohol level was 98 on admission.   Admitted for further evaluation.  Assessment & Plan:   #Status post fall with laceration to the back of head which was suture in the ER in the setting of alcohol intoxication: -CT scan of neck with acute looking C5 spinous process fracture.  Aspiration.  This was discussed with the neurosurgeon on-call who recommended supportive care and soft collar if patient has symptoms.  Patient reported pain at the site of laceration but no neck pain.  Order oxycodone as needed for pain at laceration site.  Continue to provide supportive care. -PT OT evaluation.  Alcohol abuse and watch for sign of any alcohol withdrawal: Patient drinks every day.  Continue CIWA protocol, Ativan, vitamins and supportive care.  Discussed with the patient and wife at bedside.  Resume home dose of Neurontin.  Chronic thrombocytopenia in the setting of alcohol use: Reportedly patient was not eating but just drinking at home.  He is eating okay in the hospital.  His serum sodium level improved to 127.  I would not order IV fluid.  Check urine lites and osmolality.  He does not look fluid overloaded either.  Thrombocytopenia in the setting of alcoholic liver disease: No sign of bleeding.  Monitor   DVT prophylaxis: SCD.  No anticoagulation Code Status: DNR  family Communication: Discussed with the patient's wife at bedside Disposition Plan: Likely discharge home in 1-2 days.    Consultants:   None   Procedures:none Antimicrobials: None  Subjective: Seen and  examined at bedside.  Patient reported pain at the site of laceration.  Denies neck pain, numbness.  No chest pain, shortness of breath, nausea vomiting or abdominal pain.  Wife at bedside.  Objective: Vitals:   04/07/17 1300 04/07/17 1432 04/07/17 2022 04/08/17 0526  BP: 113/84 137/88 116/69 125/68  Pulse: (!) 114 (!) 101 (!) 107 92  Resp: (!) 26 20 18 18   Temp:  81.1 F (36.9 C) 98.8 F (37.1 C) 98.4 F (36.9 C)  TempSrc:  Oral Oral Oral  SpO2: 100% 100% 99% 99%  Weight:      Height:        Intake/Output Summary (Last 24 hours) at 04/08/2017 1511 Last data filed at 04/08/2017 0758 Gross per 24 hour  Intake 360 ml  Output 2800 ml  Net -2440 ml   Filed Weights   04/07/17 0912  Weight: 81.6 kg (180 lb)    Examination:  General exam: Appears calm and comfortable , -Staples at the back of head for scalp laceration without bleeding or discharge. Respiratory system: Clear to auscultation. Respiratory effort normal. No wheezing or crackle Cardiovascular system: S1 & S2 heard, RRR.  No pedal edema. Gastrointestinal system: Abdomen is mildly distended, soft and nontender. Normal bowel sounds heard. Central nervous system: Alert and oriented. No focal neurological deficits.  Mild upper extremities tremor noticed Extremities: Symmetric 5 x 5 power. Skin: No rashes, lesions or ulcers Psychiatry: Judgement and insight appear normal. Mood & affect appropriate.     Data Reviewed: I have personally reviewed following labs and imaging studies  CBC:  Recent Labs  Lab 04/07/17 0956 04/08/17 0559  WBC 6.6 4.5  NEUTROABS 5.2  --   HGB 11.0* 9.2*  HCT 30.5* 25.9*  MCV 90.0 89.9  PLT 89* 75*   Basic Metabolic Panel: Recent Labs  Lab 04/07/17 0956 04/08/17 0559  NA 122* 127*  K 4.8 4.0  CL 83* 94*  CO2 18* 25  GLUCOSE 103* 112*  BUN 7 7  CREATININE 0.93 0.85  CALCIUM 8.8* 8.4*   GFR: Estimated Creatinine Clearance: 80.9 mL/min (by C-G formula based on SCr of 0.85  mg/dL). Liver Function Tests: Recent Labs  Lab 04/08/17 0559  AST 33  ALT 22  ALKPHOS 83  BILITOT 0.9  PROT 6.0*  ALBUMIN 3.6   No results for input(s): LIPASE, AMYLASE in the last 168 hours. No results for input(s): AMMONIA in the last 168 hours. Coagulation Profile: No results for input(s): INR, PROTIME in the last 168 hours. Cardiac Enzymes: No results for input(s): CKTOTAL, CKMB, CKMBINDEX, TROPONINI in the last 168 hours. BNP (last 3 results) No results for input(s): PROBNP in the last 8760 hours. HbA1C: No results for input(s): HGBA1C in the last 72 hours. CBG: No results for input(s): GLUCAP in the last 168 hours. Lipid Profile: No results for input(s): CHOL, HDL, LDLCALC, TRIG, CHOLHDL, LDLDIRECT in the last 72 hours. Thyroid Function Tests: No results for input(s): TSH, T4TOTAL, FREET4, T3FREE, THYROIDAB in the last 72 hours. Anemia Panel: No results for input(s): VITAMINB12, FOLATE, FERRITIN, TIBC, IRON, RETICCTPCT in the last 72 hours. Sepsis Labs: No results for input(s): PROCALCITON, LATICACIDVEN in the last 168 hours.  No results found for this or any previous visit (from the past 240 hour(s)).       Radiology Studies: Ct Head Wo Contrast  Result Date: 04/07/2017 CLINICAL DATA:  Fall EXAM: CT HEAD WITHOUT CONTRAST CT CERVICAL SPINE WITHOUT CONTRAST TECHNIQUE: Multidetector CT imaging of the head and cervical spine was performed following the standard protocol without intravenous contrast. Multiplanar CT image reconstructions of the cervical spine were also generated. COMPARISON:  10/23/2016 FINDINGS: CT HEAD FINDINGS Brain: There is atrophy and chronic small vessel disease changes. No acute intracranial abnormality. Specifically, no hemorrhage, hydrocephalus, mass lesion, acute infarction, or significant intracranial injury. Vascular: No hyperdense vessel or unexpected calcification. Skull: No acute calvarial abnormality. Sinuses/Orbits: Mucosal thickening in  the left frontal sinus and ethmoid air cells. No air-fluid levels. Mastoid air cells are clear. Orbital soft tissues unremarkable. Other: Skin laceration and skin staples noted posteriorly. CT CERVICAL SPINE FINDINGS Alignment: Normal Skull base and vertebrae: Remote type 2 odontoid fracture with nonunion. Posterior cerclage wires at C1-2 and posterior fusion changes at C1-2, stable. Fracture through the spinous process at C5 appears acute. No vertebral body fracture. Soft tissues and spinal canal: No prevertebral fluid or swelling. No visible canal hematoma. Disc levels:  Mild diffuse degenerative disc and facet disease. Upper chest: No acute findings Other: No acute findings. Carotid bulb calcifications. IMPRESSION: No acute intracranial abnormality. Atrophy, chronic microvascular disease. Remote odontoid fracture with nonunion. Posterior fusion changes at C1-2. Findings are stable. Acute appearing fracture through the C5 spinous process. Electronically Signed   By: Rolm Baptise M.D.   On: 04/07/2017 11:20   Ct Cervical Spine Wo Contrast  Result Date: 04/07/2017 CLINICAL DATA:  Fall EXAM: CT HEAD WITHOUT CONTRAST CT CERVICAL SPINE WITHOUT CONTRAST TECHNIQUE: Multidetector CT imaging of the head and cervical spine was performed following the standard protocol without intravenous contrast. Multiplanar CT image reconstructions of the  cervical spine were also generated. COMPARISON:  10/23/2016 FINDINGS: CT HEAD FINDINGS Brain: There is atrophy and chronic small vessel disease changes. No acute intracranial abnormality. Specifically, no hemorrhage, hydrocephalus, mass lesion, acute infarction, or significant intracranial injury. Vascular: No hyperdense vessel or unexpected calcification. Skull: No acute calvarial abnormality. Sinuses/Orbits: Mucosal thickening in the left frontal sinus and ethmoid air cells. No air-fluid levels. Mastoid air cells are clear. Orbital soft tissues unremarkable. Other: Skin laceration  and skin staples noted posteriorly. CT CERVICAL SPINE FINDINGS Alignment: Normal Skull base and vertebrae: Remote type 2 odontoid fracture with nonunion. Posterior cerclage wires at C1-2 and posterior fusion changes at C1-2, stable. Fracture through the spinous process at C5 appears acute. No vertebral body fracture. Soft tissues and spinal canal: No prevertebral fluid or swelling. No visible canal hematoma. Disc levels:  Mild diffuse degenerative disc and facet disease. Upper chest: No acute findings Other: No acute findings. Carotid bulb calcifications. IMPRESSION: No acute intracranial abnormality. Atrophy, chronic microvascular disease. Remote odontoid fracture with nonunion. Posterior fusion changes at C1-2. Findings are stable. Acute appearing fracture through the C5 spinous process. Electronically Signed   By: Rolm Baptise M.D.   On: 04/07/2017 11:20        Scheduled Meds: . folic acid  1 mg Oral Daily  . gabapentin  200 mg Oral BID  . LORazepam  0-4 mg Intravenous Q6H   Or  . LORazepam  0-4 mg Oral Q6H  . [START ON 04/09/2017] LORazepam  0-4 mg Intravenous Q12H   Or  . [START ON 04/09/2017] LORazepam  0-4 mg Oral Q12H  . metoprolol succinate  25 mg Oral Daily  . multivitamin with minerals  1 tablet Oral Daily  . pantoprazole  40 mg Oral Daily  . thiamine  100 mg Oral Daily   Or  . thiamine  100 mg Intravenous Daily   Continuous Infusions:   LOS: 1 day    Aland Chestnutt Tanna Furry, MD [facility] Pager [phone]  If 7PM-7AM, please contact night-coverage [email or web address] Password [email or web address] 04/08/2017, 3:11 PM

## 2017-04-08 NOTE — Progress Notes (Signed)
PT Cancellation Note  Patient Details Name: [name] MRN: [id number] DOB: [birth year]   Cancelled Treatment:    Reason Eval/Treat Not Completed: Medical issues which prohibited therapy, per RN, too weak and shakey and painful head.   Claretha Cooper 04/08/2017, 1:30 PM Tresa Endo PT [phone]

## 2017-04-09 DIAGNOSIS — W19XXXA Unspecified fall, initial encounter: Secondary | ICD-10-CM

## 2017-04-09 DIAGNOSIS — F1010 Alcohol abuse, uncomplicated: Secondary | ICD-10-CM

## 2017-04-09 LAB — CBC
HCT: 23.6 % — ABNORMAL LOW (ref 39.0–52.0)
HEMOGLOBIN: 8.4 g/dL — AB (ref 13.0–17.0)
MCH: 32.8 pg (ref 26.0–34.0)
MCHC: 35.6 g/dL (ref 30.0–36.0)
MCV: 92.2 fL (ref 78.0–100.0)
PLATELETS: 76 10*3/uL — AB (ref 150–400)
RBC: 2.56 MIL/uL — AB (ref 4.22–5.81)
RDW: 14.5 % (ref 11.5–15.5)
WBC: 5.3 10*3/uL (ref 4.0–10.5)

## 2017-04-09 LAB — MAGNESIUM: Magnesium: 1.5 mg/dL — ABNORMAL LOW (ref 1.7–2.4)

## 2017-04-09 LAB — BASIC METABOLIC PANEL
ANION GAP: 10 (ref 5–15)
BUN: 5 mg/dL — ABNORMAL LOW (ref 6–20)
CALCIUM: 8.4 mg/dL — AB (ref 8.9–10.3)
CO2: 23 mmol/L (ref 22–32)
Chloride: 93 mmol/L — ABNORMAL LOW (ref 101–111)
Creatinine, Ser: 0.83 mg/dL (ref 0.61–1.24)
GFR calc non Af Amer: >60 mL/min (ref >60)
Glucose, Bld: 103 mg/dL — ABNORMAL HIGH (ref 65–99)
POTASSIUM: 3.7 mmol/L (ref 3.5–5.1)
Sodium: 126 mmol/L — ABNORMAL LOW (ref 135–145)

## 2017-04-09 MED ORDER — GUAIFENESIN-DM 100-10 MG/5ML PO SYRP
5.0000 mL | ORAL_SOLUTION | ORAL | Status: DC | PRN
Start: 2017-04-09 — End: 2017-04-11
  Administered 2017-04-09 – 2017-04-10 (×3): 5 mL via ORAL
  Filled 2017-04-09 (×3): qty 10

## 2017-04-09 MED ORDER — MAGNESIUM SULFATE 2 GM/50ML IV SOLN
2.0000 g | Freq: Once | INTRAVENOUS | Status: AC
  Administered 2017-04-09: 2 g via INTRAVENOUS
  Filled 2017-04-09: qty 50

## 2017-04-09 MED ORDER — SODIUM CHLORIDE 0.9 % IV SOLN
INTRAVENOUS | Status: DC
  Administered 2017-04-09 – 2017-04-11 (×5): via INTRAVENOUS

## 2017-04-09 MED ORDER — LORATADINE 10 MG PO TABS
10.0000 mg | ORAL_TABLET | Freq: Every day | ORAL | Status: DC
  Administered 2017-04-09 – 2017-04-11 (×3): 10 mg via ORAL
  Filled 2017-04-09 (×3): qty 1

## 2017-04-09 NOTE — Progress Notes (Signed)
This encounter was created in error - please disregard.

## 2017-04-09 NOTE — Evaluation (Addendum)
Occupational Therapy Evaluation Patient Details Name: [name] MRN: [id number] DOB: 27-Oct-1945 Today's Date: 04/09/2017    History of Present Illness [name] is a 72 y.o. male with medical history significant of alcohol abuse, hypertension, dilated cardiomyopathy last echo 05/2016 ejection fraction 55-60%, memory  loss probably related to alcohol and was getting up from the car and got lightheaded and fell backward and hit the back of his head with a resulting laceration.   Clinical Impression   Pt up with walker with +2 for safety. Pt's wife reports pt is home during the day by himself and has had multiple falls in the last month. She is concerned about his safety going home. Feel pt may benefit from SNF at d/c to increase safety and independence for return home. He demonstrates some impulsivity with attempting to sit down before fully backing up to the recliner. Will continue to follow to progress ADL independence.     Follow Up Recommendations  Supervision/Assistance - 24 hour;SNF    Equipment Recommendations  (defer next venue)    Recommendations for Other Services       Precautions / Restrictions Precautions Precautions: Fall Restrictions Weight Bearing Restrictions: No      Mobility Bed Mobility Overal bed mobility: Needs Assistance Bed Mobility: Supine to Sit     Supine to sit: Min assist        Transfers Overall transfer level: Needs assistance Equipment used: Rolling walker (2 wheeled) Transfers: Sit to/from Stand Sit to Stand: Min assist         General transfer comment: steady assist to rise from bed and cues for safety.     Balance Overall balance assessment: History of Falls                                         ADL either performed or assessed with clinical judgement   ADL Overall ADL's : Needs assistance/impaired Eating/Feeding: Independent;Sitting   Grooming: Wash/dry hands;Set up;Sitting   Upper Body Bathing:  Set up;Sitting   Lower Body Bathing: Moderate assistance;Sit to/from stand   Upper Body Dressing : Minimal assistance;Sitting   Lower Body Dressing: Moderate assistance;Sit to/from stand   Toilet Transfer: +2 for safety/equipment;Minimal assistance;RW   Toileting- Clothing Manipulation and Hygiene: Minimal assistance;Sit to/from stand         General ADL Comments: Pt tending to sit down prematurely in recliner and needed cues to keep walker close to him for improved safety. Wife reports pt has had at least 4 falls in the last month and she is not home during the day so she is concerned about him going home at this time. Pt with no report of neck or head pain/discomfort during session.      Vision Patient Visual Report: No change from baseline       Perception     Praxis      Pertinent Vitals/Pain Pain Assessment: No/denies pain     Hand Dominance Left   Extremity/Trunk Assessment Upper Extremity Assessment Upper Extremity Assessment: LUE deficits/detail LUE Deficits / Details: some difficutly with raising L UE-needs assist past 90 degrees shoulder flexion. Reports difficulty using L hand at times-4th and 5th digits tend to "draw in" at times.            Communication Communication Communication: HOH   Cognition Arousal/Alertness: Awake/alert Behavior During Therapy: Impulsive(alittle impulsive at times.) Overall Cognitive Status:  Within Functional Limits for tasks assessed                                     General Comments       Exercises     Shoulder Instructions      Home Living Family/patient expects to be discharged to:: Skilled nursing facility Living Arrangements: Spouse/significant other                                      Prior Functioning/Environment Level of Independence: Needs assistance  Gait / Transfers Assistance Needed: pt ambulates independently inside but history of frequent falls.  keeps a cane with  him when he goes outside but usually doesnt use it.  ADL's / Homemaking Assistance Needed: pt doesnt do much during the day per wife and pt report (sedentary) Pt toilets independently. Wife helps with showering as pt is unsteady getting into tub.   Comments: pt has a flight of stairs with one rail to bed room. Has a tubseat. Wife reports putting in grab bar currently. has higher commodes.         OT Problem List: Decreased strength;Decreased activity tolerance;Decreased knowledge of use of DME or AE      OT Treatment/Interventions: Self-care/ADL training;DME and/or AE instruction;Therapeutic activities;Patient/family education    OT Goals(Current goals can be found in the care plan section) Acute Rehab OT Goals Patient Stated Goal: agreeable to get up and walk. OT Goal Formulation: With patient Time For Goal Achievement: 04/23/17 Potential to Achieve Goals: Good ADL Goals Pt Will Perform Lower Body Bathing: with min guard assist;sit to/from stand Pt Will Perform Lower Body Dressing: with min guard assist;sit to/from stand Pt Will Transfer to Toilet: with supervision;ambulating(higher toilet) Pt Will Perform Toileting - Clothing Manipulation and hygiene: with min guard assist  OT Frequency: Min 2X/week   Barriers to D/C:            Co-evaluation PT/OT/SLP Co-Evaluation/Treatment: Yes Reason for Co-Treatment: For patient/therapist safety   OT goals addressed during session: ADL's and self-care;Proper use of Adaptive equipment and DME      AM-PAC PT "6 Clicks" Daily Activity     Outcome Measure Help from another person eating meals?: None Help from another person taking care of personal grooming?: A Little Help from another person toileting, which includes using toliet, bedpan, or urinal?: A Little Help from another person bathing (including washing, rinsing, drying)?: A Little Help from another person to put on and taking off regular upper body clothing?: A Little Help from  another person to put on and taking off regular lower body clothing?: A Lot 6 Click Score: 18   End of Session Equipment Utilized During Treatment: Gait belt;Rolling walker  Activity Tolerance: Patient tolerated treatment well Patient left: in chair;with call bell/phone within reach;with chair alarm set  OT Visit Diagnosis: Unsteadiness on feet (R26.81);Muscle weakness (generalized) (M62.81)                Time: 0272-5366 OT Time Calculation (min): 23 min Charges:  OT General Charges $OT Visit: 1 Visit OT Evaluation $OT Eval Low Complexity: 1 Low G-Codes:       Jae Dire Krisanne Lich 04/09/2017, 2:47 PM

## 2017-04-09 NOTE — Evaluation (Signed)
Physical Therapy Evaluation Patient Details Name: [name] MRN: [id number] DOB: 08-28-1945 Today's Date: 04/09/2017   History of Present Illness  [name] is a 72 y.o. male with medical history significant of alcohol abuse, hypertension, dilated cardiomyopathy last echo 05/2016 ejection fraction 55-60%, memory  loss probably related to alcohol and was getting up from the car and got lightheaded and fell backward and hit the back of his head with a resulting laceration.  Clinical Impression  The patient  Ambulated x 125' with Rw, patient requires steady assist at times. patient would not be safe if home alone, requires assistance to ambulate. Pt admitted with above diagnosis. Pt currently with functional limitations due to the deficits listed below (see PT Problem List). Pt will benefit from skilled PT to increase their independence and safety with mobility to allow discharge to the venue listed below.       Follow Up Recommendations SNF    Equipment Recommendations  None recommended by PT    Recommendations for Other Services       Precautions / Restrictions Precautions Precautions: Fall Restrictions Weight Bearing Restrictions: No      Mobility  Bed Mobility Overal bed mobility: Needs Assistance Bed Mobility: Supine to Sit     Supine to sit: Min assist        Transfers Overall transfer level: Needs assistance Equipment used: Rolling walker (2 wheeled) Transfers: Sit to/from Stand Sit to Stand: Min assist         General transfer comment: steady assist to rise from bed and cues for safety.   Ambulation/Gait Ambulation/Gait assistance: Min assist;+2 safety/equipment Ambulation Distance (Feet): 120 Feet Assistive device: Rolling walker (2 wheeled) Gait Pattern/deviations: Step-to pattern;Step-through pattern     General Gait Details: steady assist for turning and for backing up, multimodal cues.  Stairs            Wheelchair Mobility     Modified Rankin (Stroke Patients Only)       Balance Overall balance assessment: History of Falls                                           Pertinent Vitals/Pain Pain Assessment: No/denies pain    Home Living Family/patient expects to be discharged to:: Skilled nursing facility Living Arrangements: Spouse/significant other                    Prior Function Level of Independence: Needs assistance   Gait / Transfers Assistance Needed: pt ambulates independently inside but history of frequent falls.  keeps a cane with him when he goes outside but usually doesnt use it.   ADL's / Homemaking Assistance Needed: pt doesnt do much during the day per wife and pt report (sedentary) Pt toilets independently. Wife helps with showering as pt is unsteady getting into tub.  Comments: pt has a flight of stairs with one rail to bed room. Has a tubseat. Wife reports putting in grab bar currently. has higher commodes.      Hand Dominance   Dominant Hand: Left    Extremity/Trunk Assessment   Upper Extremity Assessment Upper Extremity Assessment: Defer to OT evaluation LUE Deficits / Details: some difficutly with raising L UE-needs assist past 90 degrees shoulder flexion. Reports difficulty using L hand at times-4th and 5th digits tend to "draw in" at times.     Lower Extremity  Assessment Lower Extremity Assessment: Generalized weakness       Communication   Communication: HOH  Cognition Arousal/Alertness: Awake/alert Behavior During Therapy: Impulsive Overall Cognitive Status: Impaired/Different from baseline Area of Impairment: Awareness                           Awareness: Emergent          General Comments      Exercises     Assessment/Plan    PT Assessment Patient needs continued PT services  PT Problem List Decreased strength;Decreased range of motion;Decreased activity tolerance;Decreased mobility;Decreased balance;Decreased  knowledge of precautions;Decreased knowledge of use of DME;Decreased safety awareness       PT Treatment Interventions DME instruction;Therapeutic exercise;Gait training;Functional mobility training;Therapeutic activities;Cognitive remediation;Patient/family education    PT Goals (Current goals can be found in the Care Plan section)  Acute Rehab PT Goals Patient Stated Goal: agreeable to get up and walk. PT Goal Formulation: With patient/family Time For Goal Achievement: 04/23/17 Potential to Achieve Goals: Good    Frequency Min 2X/week   Barriers to discharge Decreased caregiver support      Co-evaluation PT/OT/SLP Co-Evaluation/Treatment: Yes Reason for Co-Treatment: For patient/therapist safety PT goals addressed during session: Mobility/safety with mobility         AM-PAC PT "6 Clicks" Daily Activity  Outcome Measure Difficulty turning over in bed (including adjusting bedclothes, sheets and blankets)?: A Little Difficulty moving from lying on back to sitting on the side of the bed? : A Little Difficulty sitting down on and standing up from a chair with arms (e.g., wheelchair, bedside commode, etc,.)?: A Lot Help needed moving to and from a bed to chair (including a wheelchair)?: A Lot Help needed walking in hospital room?: A Lot Help needed climbing 3-5 steps with a railing? : Total 6 Click Score: 13    End of Session Equipment Utilized During Treatment: Gait belt Activity Tolerance: Patient tolerated treatment well Patient left: in chair;with call bell/phone within reach;with chair alarm set;with family/visitor present Nurse Communication: Mobility status PT Visit Diagnosis: Unsteadiness on feet (R26.81);History of falling (Z91.81)    Time: 9381-0175 PT Time Calculation (min) (ACUTE ONLY): 33 min   Charges:   PT Evaluation $PT Eval Low Complexity: 1 Low     PT G CodesTresa Endo PT [phone]   Claretha Cooper 04/09/2017, 5:56 PM

## 2017-04-09 NOTE — Progress Notes (Signed)
PROGRESS NOTE    [name]  CLE:[id number] DOB: 1945/03/09 DOA: 04/07/2017 PCP: Elby Beck, FNP   Brief Narrative: Patient is a 72 year old male with past medical history of alcohol abuse, hypertension, dilated cardiomyopathy, alcohol induced dementia who presented to the emergency department after a fall he sustained a scalp laceration which was stapled in the ER.  And also found to have C5 spinous process fracture .Currently being managed for alcohol withdrawal.  Assessment & Plan:   Active Problems:   ALCOHOLIC CIRRHOSIS OF LIVER   Hyponatremia   Thrombocytopenia (HCC)   Scalp laceration   Fall  Chronic alcohol abuse/alcohol withdrawal: Currently on CIWA protocol. Patient has history of chronic alcohol abuse.  Also has alcohol induced dementia.  Denies any alcohol rehabilitation options.  It was reported that patient drinks every day. Continue supportive care.   Alcoholic liver disease/thrombocytopenia: counseled for alcohol cessation cytopenia.  Follow-up with gastroenterology as an outpatient.  Hyponatremia: Continue gentle IV fluids.  Sodium level 126 today.  Most likely this is associated with alcohol intake.  Fall: Status post fall with laceration on the scalp.  Sutured in the emergency department.  As stated with alcohol intoxication.  CT scan of the neck also showed acute C5 spinous process fracture.  Discussed with neurosurgeon who recommended supportive care and soft collar if patient has symptoms.  Patient denies any neck pain.  Continue pain medications. Patient evaluated with a PT/OT and recommended SNIF.  Hypomagnesemia: Supplemented with magnesium.  Will check the levels tomorrow.   DVT prophylaxis: SCD Code Status: DNR Family Communication: Patient wife on the bedside Disposition Plan: Likely skilled nursing facility after the improvement of hyponatremia   Consultants: None  Procedures: None  Antimicrobials: None  Subjective: Patient seen and  examined the bedside this morning.  Looks comfortable.  Denies any neck pain, chest pain or shortness of breath.  Denies any abdominal pain.  Complains of pain on the site of laceration.  Denies any alcohol rehabilitation  Objective: Vitals:   04/09/17 0451 04/09/17 1110 04/09/17 1112 04/09/17 1115  BP: 112/66 139/72 140/82 (!) 143/81  Pulse: 82 85 93 (!) 103  Resp: 17     Temp: 98.2 F (36.8 C)     TempSrc: Oral     SpO2: 100%     Weight:      Height:        Intake/Output Summary (Last 24 hours) at 04/09/2017 1423 Last data filed at 04/09/2017 1400 Gross per 24 hour  Intake 1115 ml  Output 1375 ml  Net -260 ml   Filed Weights   04/07/17 0912  Weight: 81.6 kg (180 lb)    Examination:  General exam: Appears calm and comfortable ,Not in distress,average built HEENT:PERRL,Oral mucosa moist, Ear/Nose normal on gross exam Scalp laceration sutured with staples. Respiratory system: Bilateral equal air entry, normal vesicular breath sounds, no wheezes or crackles  Cardiovascular system: S1 & S2 heard, RRR. No JVD, murmurs, rubs, gallops or clicks. No pedal edema. Gastrointestinal system: Abdomen is mildly distended, soft and nontender. No organomegaly or masses felt. Normal bowel sounds heard. Central nervous system: Alert and oriented. No focal neurological deficits. Extremities: No edema, no clubbing ,no cyanosis, distal peripheral pulses palpable. Skin: No rashes, lesions or ulcers,no icterus ,no pallor MSK: Normal muscle bulk,tone ,power Psychiatry: Judgement and insight appear normal. Mood & affect appropriate.     Data Reviewed: I have personally reviewed following labs and imaging studies  CBC: Recent Labs  Lab 04/07/17 0956  04/08/17 0559 04/09/17 0554  WBC 6.6 4.5 5.3  NEUTROABS 5.2  --   --   HGB 11.0* 9.2* 8.4*  HCT 30.5* 25.9* 23.6*  MCV 90.0 89.9 92.2  PLT 89* 75* 76*   Basic Metabolic Panel: Recent Labs  Lab 04/07/17 0956 04/08/17 0559 04/09/17 0554    NA 122* 127* 126*  K 4.8 4.0 3.7  CL 83* 94* 93*  CO2 18* 25 23  GLUCOSE 103* 112* 103*  BUN 7 7 5*  CREATININE 0.93 0.85 0.83  CALCIUM 8.8* 8.4* 8.4*  MG  --   --  1.5*   GFR: Estimated Creatinine Clearance: 82.8 mL/min (by C-G formula based on SCr of 0.83 mg/dL). Liver Function Tests: Recent Labs  Lab 04/08/17 0559  AST 33  ALT 22  ALKPHOS 83  BILITOT 0.9  PROT 6.0*  ALBUMIN 3.6   No results for input(s): LIPASE, AMYLASE in the last 168 hours. No results for input(s): AMMONIA in the last 168 hours. Coagulation Profile: No results for input(s): INR, PROTIME in the last 168 hours. Cardiac Enzymes: No results for input(s): CKTOTAL, CKMB, CKMBINDEX, TROPONINI in the last 168 hours. BNP (last 3 results) No results for input(s): PROBNP in the last 8760 hours. HbA1C: No results for input(s): HGBA1C in the last 72 hours. CBG: No results for input(s): GLUCAP in the last 168 hours. Lipid Profile: No results for input(s): CHOL, HDL, LDLCALC, TRIG, CHOLHDL, LDLDIRECT in the last 72 hours. Thyroid Function Tests: No results for input(s): TSH, T4TOTAL, FREET4, T3FREE, THYROIDAB in the last 72 hours. Anemia Panel: No results for input(s): VITAMINB12, FOLATE, FERRITIN, TIBC, IRON, RETICCTPCT in the last 72 hours. Sepsis Labs: No results for input(s): PROCALCITON, LATICACIDVEN in the last 168 hours.  No results found for this or any previous visit (from the past 240 hour(s)).       Radiology Studies: No results found.      Scheduled Meds: . folic acid  1 mg Oral Daily  . gabapentin  200 mg Oral BID  . LORazepam  0-4 mg Intravenous Q12H   Or  . LORazepam  0-4 mg Oral Q12H  . metoprolol succinate  25 mg Oral Daily  . multivitamin with minerals  1 tablet Oral Daily  . pantoprazole  40 mg Oral Daily  . thiamine  100 mg Oral Daily   Or  . thiamine  100 mg Intravenous Daily   Continuous Infusions: . sodium chloride 75 mL/hr at 04/09/17 0844     LOS: 2 days     Time spent: More than 50% of that time was spent in counseling and/or coordination of care.      Shelly Coss, MD [facility] Pager [phone]  If 7PM-7AM, please contact night-coverage [email or web address] Password [email or web address] 04/09/2017, 2:23 PM

## 2017-04-10 LAB — BASIC METABOLIC PANEL
ANION GAP: 8 (ref 5–15)
BUN: 7 mg/dL (ref 6–20)
CO2: 26 mmol/L (ref 22–32)
Calcium: 8.4 mg/dL — ABNORMAL LOW (ref 8.9–10.3)
Chloride: 95 mmol/L — ABNORMAL LOW (ref 101–111)
Creatinine, Ser: 0.75 mg/dL (ref 0.61–1.24)
GFR calc Af Amer: >60 mL/min (ref >60)
GLUCOSE: 102 mg/dL — AB (ref 65–99)
POTASSIUM: 3.7 mmol/L (ref 3.5–5.1)
Sodium: 129 mmol/L — ABNORMAL LOW (ref 135–145)

## 2017-04-10 LAB — MRSA PCR SCREENING: MRSA BY PCR: POSITIVE — AB

## 2017-04-10 LAB — MAGNESIUM: MAGNESIUM: 1.7 mg/dL (ref 1.7–2.4)

## 2017-04-10 MED ORDER — MAGNESIUM OXIDE 400 (241.3 MG) MG PO TABS
400.0000 mg | ORAL_TABLET | Freq: Every day | ORAL | Status: DC
  Administered 2017-04-10 – 2017-04-11 (×2): 400 mg via ORAL
  Filled 2017-04-10 (×2): qty 1

## 2017-04-10 MED ORDER — MUPIROCIN 2 % EX OINT
1.0000 "application " | TOPICAL_OINTMENT | Freq: Two times a day (BID) | CUTANEOUS | Status: DC
  Administered 2017-04-10 – 2017-04-11 (×3): 1 via NASAL
  Filled 2017-04-10: qty 22

## 2017-04-10 MED ORDER — CHLORHEXIDINE GLUCONATE CLOTH 2 % EX PADS
6.0000 | MEDICATED_PAD | Freq: Every day | CUTANEOUS | Status: DC
  Administered 2017-04-11: 6 via TOPICAL

## 2017-04-10 NOTE — Progress Notes (Signed)
CSW unable to screen patient for PASRR, as patient's information does not match PASRR system. PASRR help not open on Sunday; weekday CSW will need to follow up to confirm patient's PASRR number or screen for new PASRR before patient can admit to SNF.  Estanislado Emms, [place]

## 2017-04-10 NOTE — Progress Notes (Signed)
PROGRESS NOTE    [name]  OFB:[id number] DOB: 29-Nov-1945 DOA: 04/07/2017 PCP: Elby Beck, FNP   Brief Narrative: Patient is a 72 year old male with past medical history of alcohol abuse, hypertension, dilated cardiomyopathy, alcohol induced dementia who presented to the emergency department after a fall he sustained a scalp laceration which was stapled in the ER.  And also found to have C5 spinous process fracture .Currently being managed for alcohol withdrawal.  Assessment & Plan:   Active Problems:   ALCOHOLIC CIRRHOSIS OF LIVER   Hyponatremia   Thrombocytopenia (HCC)   Scalp laceration   Fall  Chronic alcohol abuse/alcohol withdrawal: Currently on CIWA protocol. Patient has history of chronic alcohol abuse.  Also has alcohol induced dementia.  He denies any alcohol rehabilitation options.  It was reported that patient drinks every day. Mental status is currently on baseline ,he is not agitated.  Alcoholic liver disease/thrombocytopenia: Counseled for alcohol cessation . Also has thrombocytopenia.  Follow-up with gastroenterology as an outpatient.  Hyponatremia: Continue gentle IV fluids.  Sodium level 129 today.  Most likely this is associated with alcohol intake/liver cirrhosis.  Fall: Status post fall with laceration on the scalp.  Sutured in the emergency department. Presented with alcohol intoxication.  CT scan of the neck also showed acute C5 spinous process fracture.  Discussed with neurosurgeon who recommended supportive care and soft collar if patient has symptoms.  Patient denies any neck pain.  Continue pain medications. Patient evaluated with a PT/OT and recommended SNIF.  Hypomagnesemia: Supplemented with magnesium.    DVT prophylaxis: SCD Code Status: DNR Family Communication: None on the bedside Disposition Plan: Skilled nursing facility as soon as the bed is available  Consultants: None  Procedures: None  Antimicrobials:  None  Subjective: Patient seen and examined the bedside this morning.  Remains comfortable.  No new issues/events.  Objective: Vitals:   04/09/17 1115 04/09/17 1733 04/09/17 2054 04/10/17 0508  BP: (!) 143/81 130/85 127/72 126/77  Pulse: (!) 103 81 78 79  Resp:  (!) 21 20 20   Temp:  98.9 F (37.2 C) 98.4 F (36.9 C) 98 F (36.7 C)  TempSrc:  Oral Oral Oral  SpO2:  100% 98% 100%  Weight:      Height:        Intake/Output Summary (Last 24 hours) at 04/10/2017 1220 Last data filed at 04/10/2017 7824 Gross per 24 hour  Intake 1437.5 ml  Output 500 ml  Net 937.5 ml   Filed Weights   04/07/17 0912  Weight: 81.6 kg (180 lb)    Examination:  General exam: Appears calm and comfortable ,Not in distress,average built HEENT:PERRL,Oral mucosa moist, Ear/Nose normal on gross exam Respiratory system: Bilateral equal air entry, normal vesicular breath sounds, no wheezes or crackles  Cardiovascular system: S1 & S2 heard, RRR. No JVD, murmurs, rubs, gallops or clicks. Gastrointestinal system: Abdomen is mildly distended, soft and nontender. No organomegaly or masses felt. Normal bowel sounds heard. Central nervous system: Alert and oriented. No focal neurological deficits. Extremities: No edema, no clubbing ,no cyanosis, distal peripheral pulses palpable. Skin: No rashes, lesions or ulcers,no icterus ,no pallor MSK: Normal muscle bulk,tone ,power Psychiatry: Judgement and insight appear normal. Mood & affect appropriate.        Data Reviewed: I have personally reviewed following labs and imaging studies  CBC: Recent Labs  Lab 04/07/17 0956 04/08/17 0559 04/09/17 0554  WBC 6.6 4.5 5.3  NEUTROABS 5.2  --   --   HGB 11.0*  9.2* 8.4*  HCT 30.5* 25.9* 23.6*  MCV 90.0 89.9 92.2  PLT 89* 75* 76*   Basic Metabolic Panel: Recent Labs  Lab 04/07/17 0956 04/08/17 0559 04/09/17 0554 04/10/17 0516  NA 122* 127* 126* 129*  K 4.8 4.0 3.7 3.7  CL 83* 94* 93* 95*  CO2 18* 25 23  26   GLUCOSE 103* 112* 103* 102*  BUN 7 7 5* 7  CREATININE 0.93 0.85 0.83 0.75  CALCIUM 8.8* 8.4* 8.4* 8.4*  MG  --   --  1.5* 1.7   GFR: Estimated Creatinine Clearance: 85.9 mL/min (by C-G formula based on SCr of 0.75 mg/dL). Liver Function Tests: Recent Labs  Lab 04/08/17 0559  AST 33  ALT 22  ALKPHOS 83  BILITOT 0.9  PROT 6.0*  ALBUMIN 3.6   No results for input(s): LIPASE, AMYLASE in the last 168 hours. No results for input(s): AMMONIA in the last 168 hours. Coagulation Profile: No results for input(s): INR, PROTIME in the last 168 hours. Cardiac Enzymes: No results for input(s): CKTOTAL, CKMB, CKMBINDEX, TROPONINI in the last 168 hours. BNP (last 3 results) No results for input(s): PROBNP in the last 8760 hours. HbA1C: No results for input(s): HGBA1C in the last 72 hours. CBG: No results for input(s): GLUCAP in the last 168 hours. Lipid Profile: No results for input(s): CHOL, HDL, LDLCALC, TRIG, CHOLHDL, LDLDIRECT in the last 72 hours. Thyroid Function Tests: No results for input(s): TSH, T4TOTAL, FREET4, T3FREE, THYROIDAB in the last 72 hours. Anemia Panel: No results for input(s): VITAMINB12, FOLATE, FERRITIN, TIBC, IRON, RETICCTPCT in the last 72 hours. Sepsis Labs: No results for input(s): PROCALCITON, LATICACIDVEN in the last 168 hours.  No results found for this or any previous visit (from the past 240 hour(s)).       Radiology Studies: No results found.      Scheduled Meds: . folic acid  1 mg Oral Daily  . gabapentin  200 mg Oral BID  . loratadine  10 mg Oral Daily  . LORazepam  0-4 mg Intravenous Q12H   Or  . LORazepam  0-4 mg Oral Q12H  . magnesium oxide  400 mg Oral Daily  . metoprolol succinate  25 mg Oral Daily  . multivitamin with minerals  1 tablet Oral Daily  . pantoprazole  40 mg Oral Daily  . thiamine  100 mg Oral Daily   Or  . thiamine  100 mg Intravenous Daily   Continuous Infusions: . sodium chloride 75 mL/hr at 04/10/17  0940     LOS: 3 days    Time spent: More than 50% of that time was spent in counseling and/or coordination of care.      Shelly Coss, MD [facility] Pager [phone]  If 7PM-7AM, please contact night-coverage [email or web address] Password [email or web address] 04/10/2017, 12:20 PM

## 2017-04-10 NOTE — NC FL2 (Signed)
[place] LEVEL OF CARE SCREENING TOOL     IDENTIFICATION  Patient Name: [name] Birthdate: 04/15/1945 Sex: male Admission Date (Current Location): 04/07/2017  [facility] and [place] Number:  Herbalist and Address:  [facility],  [place] [address] [place]      Provider Number: [id number]  Attending Physician Name and Address:  Shelly Coss, MD  Relative Name and Phone Number:  Mykah Shin, wife, [phone]    Current Level of Care: Hospital Recommended Level of Care: [place] Prior Approval Number:    Date Approved/Denied:   PASRR Number:    Discharge Plan: SNF    Current Diagnoses: Patient Active Problem List   Diagnosis Date Noted  . Fall 04/09/2017  . Scalp laceration   . Hypomagnesemia 10/25/2016  . Hip fracture ([place]) 10/24/2016  . Displaced fracture of left femoral neck ([place]) 10/24/2016  . Thrombocytopenia ([place]) 10/24/2016  . Closed displaced fracture of left femoral neck ([place]) 10/23/2016  . Contracture of muscle of left hand 09/15/2016  . Infected laceration 07/08/2016  . Generalized muscle weakness 07/08/2016  . Anemia 05/18/2016  . Syncope and collapse   . Syncope 05/17/2016  . Cellulitis 05/17/2016  . Rib fractures 05/17/2016  . Bullous pemphigoid 05/17/2016  . ETOH abuse 03/17/2016  . Dizziness and giddiness 12/09/2015  . Chronic pain syndrome 12/09/2015  . Arthritis 11/20/2015  . Mild CAD   . DCM (dilated cardiomyopathy) ([place])   . Abnormal nuclear stress test 05/07/2015  . PAC (premature atrial contraction) 04/07/2015  . Atrial tachycardia, paroxysmal ([place]) 03/10/2015  . Depression 04/12/2012  . C2 cervical fracture ([place]) 02/29/2012  . Alcohol withdrawal ([place]) 11/06/2011  . Fracture of humerus, proximal, left, closed 11/02/2011  . Hyponatremia 11/17/2010  . H/O: upper GI bleed 10/20/2010  . Stomach ulcer from aspirin/ibuprofen-like drugs (NSAID's) 10/20/2010  . Colon  polyp 08/26/2010  . Diverticulosis of colon (without mention of hemorrhage) 08/26/2010  . BPH (benign prostatic hyperplasia) 07/02/2010  . Deficiency anemia 03/09/2010  . Hyposmolality and/or hyponatremia 10/17/2009  . ALCOHOLIC CIRRHOSIS OF LIVER 07/25/2009  . Hypokalemia 04/03/2007  . Anxiety state 01/24/2007  . HLD (hyperlipidemia) 07/28/2006  . Alcohol abuse 07/28/2006  . Essential hypertension 07/28/2006  . IMPOTENCE, ORGANIC ORIGIN 07/28/2006    Orientation RESPIRATION BLADDER Height & Weight     Self, Time, Situation, Place  Normal Continent Weight: 180 lb (81.6 kg) Height:  5\' 9"  (175.3 cm)  BEHAVIORAL SYMPTOMS/MOOD NEUROLOGICAL BOWEL NUTRITION STATUS      Continent Diet(please see DC summary)  AMBULATORY STATUS COMMUNICATION OF NEEDS Skin   Limited Assist Verbally Other (Comment)(laceration to the head)                       Personal Care Assistance Level of Assistance  Bathing, Feeding, Dressing Bathing Assistance: Limited assistance Feeding assistance: Independent Dressing Assistance: Limited assistance     Functional Limitations Info  Sight, Hearing, Speech Sight Info: Adequate Hearing Info: Adequate Speech Info: Adequate    SPECIAL CARE FACTORS FREQUENCY  PT (By licensed PT), OT (By licensed OT)     PT Frequency: 5x/week OT Frequency: 5x/week            Contractures Contractures Info: Not present    Additional Factors Info  Code Status, Allergies Code Status Info: DNR Allergies Info: Nifedipine           Current Medications (04/10/2017):  This is the current hospital active medication  list Current Facility-Administered Medications  Medication Dose Route Frequency Provider Last Rate Last Dose  . 0.9 %  sodium chloride infusion   Intravenous Continuous Shelly Coss, MD 75 mL/hr at 04/10/17 0940    . folic acid (FOLVITE) tablet 1 mg  1 mg Oral Daily Georgette Shell, MD   1 mg at 04/10/17 0940  . gabapentin (NEURONTIN) capsule 200  mg  200 mg Oral BID Rosita Fire, MD   200 mg at 04/10/17 0940  . guaiFENesin-dextromethorphan (ROBITUSSIN DM) 100-10 MG/5ML syrup 5 mL  5 mL Oral Q4H PRN Shelly Coss, MD   5 mL at 04/09/17 2155  . loratadine (CLARITIN) tablet 10 mg  10 mg Oral Daily Shelly Coss, MD   10 mg at 04/10/17 0940  . LORazepam (ATIVAN) injection 0-4 mg  0-4 mg Intravenous Q12H Isla Pence, MD       Or  . LORazepam (ATIVAN) tablet 0-4 mg  0-4 mg Oral Q12H Isla Pence, MD   1 mg at 04/09/17 2030  . magnesium oxide (MAG-OX) tablet 400 mg  400 mg Oral Daily Shelly Coss, MD   400 mg at 04/10/17 0940  . metoprolol succinate (TOPROL-XL) 24 hr tablet 25 mg  25 mg Oral Daily Georgette Shell, MD   25 mg at 04/10/17 0940  . multivitamin with minerals tablet 1 tablet  1 tablet Oral Daily Rosita Fire, MD   1 tablet at 04/10/17 0940  . ondansetron (ZOFRAN) injection 4 mg  4 mg Intravenous Q6H PRN Georgette Shell, MD   4 mg at 04/08/17 0058  . oxyCODONE (Oxy IR/ROXICODONE) immediate release tablet 5 mg  5 mg Oral Q6H PRN Rosita Fire, MD   5 mg at 04/09/17 2036  . pantoprazole (PROTONIX) EC tablet 40 mg  40 mg Oral Daily Georgette Shell, MD   40 mg at 04/10/17 0940  . thiamine (VITAMIN B-1) tablet 100 mg  100 mg Oral Daily Isla Pence, MD   100 mg at 04/10/17 0940   Or  . thiamine (B-1) injection 100 mg  100 mg Intravenous Daily Isla Pence, MD         Discharge Medications: Please see discharge summary for a list of discharge medications.  Relevant Imaging Results:  Relevant Lab Results:   Additional Information SSN: [id number]  Estanislado Emms, LCSW

## 2017-04-10 NOTE — Clinical Social Work Note (Signed)
Clinical Social Work Assessment  Patient Details  Name: [name] MRN: [id number] Date of Birth: 07-03-1945  Date of referral:  04/10/17               Reason for consult:  Facility Placement                Permission sought to share information with:  Facility Sport and exercise psychologist, Family Supports Permission granted to share information::  Yes, Verbal Permission Granted  Name::     [place]::  SNFs  Relationship::  spouse  Contact Information:  [phone]  Housing/Transportation Living arrangements for the past 2 months:  [place] of Information:  Patient, Spouse Patient Interpreter Needed:  None Criminal Activity/Legal Involvement Pertinent to Current Situation/Hospitalization:  No - Comment as needed Significant Relationships:  Spouse Lives with:  Spouse Do you feel safe going back to the place where you live?  Yes Need for family participation in patient care:  No (Coment)  Care giving concerns: Patient from home with wife. PT recommending SNF.   Social Worker assessment / plan: CSW met with patient and wife, [name], at bedside. Patient lives with wife at home, but wife works during the day so would not be able to asssit patient during the day. Patient prefers not to go to SNF; however, spouse would like patient to go for short term rehab to build up his strength before going home since he will be alone during the day. Patient has previously been at [facility] and would prefer to go there again if bed available. CSW to send out initial referrals and follow up with bed offers. CSW to support with disposition planning.  Employment status:  Retired Forensic scientist:  Medicare PT Recommendations:  [place] / Referral to community resources:  [place]  Patient/Family's Response to care: Patient and wife appreciative of care.  Patient/Family's Understanding of and Emotional Response to Diagnosis,  Current Treatment, and Prognosis: Patient and wife with understanding of patient's condition and recommendation for SNF. Patient with reservations about returning to SNF but would prefer [facility] if he does have to go.  Emotional Assessment Appearance:  Appears stated age Attitude/Demeanor/Rapport:  Engaged Affect (typically observed):  Calm Orientation:  Oriented to Self, Oriented to Place, Oriented to  Time, Oriented to Situation Alcohol / Substance use:  Not Applicable Psych involvement (Current and /or in the community):  No (Comment)  Discharge Needs  Concerns to be addressed:  Discharge Planning Concerns, Care Coordination Readmission within the last 30 days:  No Current discharge risk:  Physical Impairment, Other(home alone during the day while wife at work) Barriers to Discharge:  Continued Medical Work up   [employer], LCSW 04/10/2017, 11:18 AM

## 2017-04-11 DIAGNOSIS — M6281 Muscle weakness (generalized): Secondary | ICD-10-CM | POA: Diagnosis not present

## 2017-04-11 DIAGNOSIS — R197 Diarrhea, unspecified: Secondary | ICD-10-CM | POA: Diagnosis not present

## 2017-04-11 DIAGNOSIS — R52 Pain, unspecified: Secondary | ICD-10-CM | POA: Diagnosis not present

## 2017-04-11 DIAGNOSIS — I10 Essential (primary) hypertension: Secondary | ICD-10-CM | POA: Diagnosis not present

## 2017-04-11 DIAGNOSIS — G4700 Insomnia, unspecified: Secondary | ICD-10-CM | POA: Diagnosis not present

## 2017-04-11 DIAGNOSIS — G8911 Acute pain due to trauma: Secondary | ICD-10-CM | POA: Diagnosis not present

## 2017-04-11 DIAGNOSIS — Z4789 Encounter for other orthopedic aftercare: Secondary | ICD-10-CM | POA: Diagnosis not present

## 2017-04-11 DIAGNOSIS — E871 Hypo-osmolality and hyponatremia: Secondary | ICD-10-CM | POA: Diagnosis not present

## 2017-04-11 DIAGNOSIS — S129XXA Fracture of neck, unspecified, initial encounter: Secondary | ICD-10-CM | POA: Diagnosis not present

## 2017-04-11 DIAGNOSIS — F29 Unspecified psychosis not due to a substance or known physiological condition: Secondary | ICD-10-CM | POA: Diagnosis not present

## 2017-04-11 DIAGNOSIS — F10230 Alcohol dependence with withdrawal, uncomplicated: Secondary | ICD-10-CM | POA: Diagnosis not present

## 2017-04-11 DIAGNOSIS — L03811 Cellulitis of head [any part, except face]: Secondary | ICD-10-CM | POA: Diagnosis not present

## 2017-04-11 DIAGNOSIS — S0101XA Laceration without foreign body of scalp, initial encounter: Secondary | ICD-10-CM | POA: Diagnosis not present

## 2017-04-11 DIAGNOSIS — E785 Hyperlipidemia, unspecified: Secondary | ICD-10-CM | POA: Diagnosis not present

## 2017-04-11 DIAGNOSIS — S12500D Unspecified displaced fracture of sixth cervical vertebra, subsequent encounter for fracture with routine healing: Secondary | ICD-10-CM | POA: Diagnosis not present

## 2017-04-11 DIAGNOSIS — F329 Major depressive disorder, single episode, unspecified: Secondary | ICD-10-CM | POA: Diagnosis not present

## 2017-04-11 DIAGNOSIS — R278 Other lack of coordination: Secondary | ICD-10-CM | POA: Diagnosis not present

## 2017-04-11 DIAGNOSIS — R296 Repeated falls: Secondary | ICD-10-CM | POA: Diagnosis not present

## 2017-04-11 DIAGNOSIS — F1010 Alcohol abuse, uncomplicated: Secondary | ICD-10-CM | POA: Diagnosis not present

## 2017-04-11 DIAGNOSIS — R51 Headache: Secondary | ICD-10-CM | POA: Diagnosis not present

## 2017-04-11 DIAGNOSIS — D696 Thrombocytopenia, unspecified: Secondary | ICD-10-CM | POA: Diagnosis not present

## 2017-04-11 DIAGNOSIS — D649 Anemia, unspecified: Secondary | ICD-10-CM | POA: Diagnosis not present

## 2017-04-11 DIAGNOSIS — F419 Anxiety disorder, unspecified: Secondary | ICD-10-CM | POA: Diagnosis not present

## 2017-04-11 DIAGNOSIS — W19XXXA Unspecified fall, initial encounter: Secondary | ICD-10-CM | POA: Diagnosis not present

## 2017-04-11 DIAGNOSIS — J309 Allergic rhinitis, unspecified: Secondary | ICD-10-CM | POA: Diagnosis not present

## 2017-04-11 DIAGNOSIS — G8929 Other chronic pain: Secondary | ICD-10-CM | POA: Diagnosis not present

## 2017-04-11 DIAGNOSIS — R2681 Unsteadiness on feet: Secondary | ICD-10-CM | POA: Diagnosis not present

## 2017-04-11 LAB — BASIC METABOLIC PANEL
Anion gap: 7 (ref 5–15)
BUN: 8 mg/dL (ref 6–20)
CALCIUM: 8.1 mg/dL — AB (ref 8.9–10.3)
CO2: 25 mmol/L (ref 22–32)
CREATININE: 0.67 mg/dL (ref 0.61–1.24)
Chloride: 98 mmol/L — ABNORMAL LOW (ref 101–111)
GFR calc Af Amer: >60 mL/min (ref >60)
GFR calc non Af Amer: >60 mL/min (ref >60)
GLUCOSE: 96 mg/dL (ref 65–99)
Potassium: 3.3 mmol/L — ABNORMAL LOW (ref 3.5–5.1)
SODIUM: 130 mmol/L — AB (ref 135–145)

## 2017-04-11 MED ORDER — MAGNESIUM OXIDE 400 (241.3 MG) MG PO TABS: 400.0000 mg | ORAL_TABLET | Freq: Every day | ORAL | 0 refills | Status: AC

## 2017-04-11 MED ORDER — IBUPROFEN 400 MG PO TABS: 400.0000 mg | ORAL_TABLET | Freq: Four times a day (QID) | ORAL | 0 refills | Status: DC | PRN

## 2017-04-11 MED ORDER — POTASSIUM CHLORIDE CRYS ER 20 MEQ PO TBCR: 20.0000 meq | EXTENDED_RELEASE_TABLET | Freq: Every day | ORAL | 0 refills | Status: DC

## 2017-04-11 MED ORDER — POTASSIUM CHLORIDE CRYS ER 10 MEQ PO TBCR
40.0000 meq | EXTENDED_RELEASE_TABLET | Freq: Once | ORAL | Status: AC
  Administered 2017-04-11: 40 meq via ORAL
  Filled 2017-04-11: qty 4

## 2017-04-11 NOTE — Discharge Summary (Signed)
Physician Discharge Summary  [name] XVQ:[id number] DOB: 07-Jun-1945 DOA: 04/07/2017  PCP: Elby Beck, FNP  Admit date: 04/07/2017 Discharge date: 04/11/2017  Admitted From: Home Disposition: SNF  Discharge Condition: Stable CODE STATUS:DNR Diet recommendation: Heart Healthy   Brief/Interim Summary: Patient is a 72 year old male with past medical history of alcohol abuse, hypertension, dilated cardiomyopathy, alcohol induced dementia who presented to the emergency department after a fall he sustained a scalp laceration which was stapled in the ER.  And also found to have C5 spinous process fracture .He was admitted  For the management of  alcohol withdrawal. Patient was started on CIWA protocol.  Patient is not withdrawing at present.  His vitals are stable.  Patient has been counseled for cessation of alcohol consumption Patient evaluated by physical therapy and recommended skilled nursing facility on discharge.  Patient has been counseled for alcohol cessation.  He denies any alcohol rehabilitation options.  Following problems were addressed during his hospitalization:  Chronic alcohol abuse/alcohol withdrawal: Was started on CIWA protocol. Patient has history of chronic alcohol abuse.  Also has alcohol induced dementia.  He denies any alcohol rehabilitation options.  It was reported that patient drinks every day. Mental status is currently on baseline ,he is not agitated.Ativan discontinued.  Alcoholic liver disease/thrombocytopenia: Counseled for alcohol cessation . Also has thrombocytopenia.  We will recommend to follow up  with gastroenterology as an outpatient.  Hyponatremia: Treated with  gentle IV fluids.  Sodium level 130 today.  Most likely this is associated with alcohol intake/liver cirrhosis. Check BMP in a week.  Fall: Status post fall with laceration on the scalp.  Sutured in the emergency department. Presented with alcohol intoxication.  CT scan of the neck  also showed acute C5 spinous process fracture.  Discussed with neurosurgeon who recommended supportive care and soft collar if patient has symptoms. Patient denies any neck pain.  Continue pain medications as needed. Patient evaluated by PT/OT and recommended SNIF.  HypokalemiaHypomagnesemia: Supplemented with K/magnesium.  Continue supplementation.  Check levels in a week.    Discharge Diagnoses:  Active Problems:   ALCOHOLIC CIRRHOSIS OF LIVER   Hyponatremia   Thrombocytopenia (HCC)   Scalp laceration   Fall    Discharge Instructions  Discharge Instructions    Diet - low sodium heart healthy   Complete by:  As directed    Discharge instructions   Complete by:  As directed    1) Check BMP and Magnesium level in a week. 2) Please stop drinking alcohol. 3) Scalp sutures can be removed in 5-7 days.   Increase activity slowly   Complete by:  As directed      Allergies as of 04/11/2017      Reactions   Nifedipine Swelling   11/02/2011 pt denies this allergy.      Medication List    TAKE these medications   aspirin EC 81 MG tablet Take 243 mg by mouth once.   cetirizine 10 MG tablet Commonly known as:  ZYRTEC Take 1 tablet (10 mg total) daily by mouth.   cyclobenzaprine 10 MG tablet Commonly known as:  FLEXERIL Take 1 tablet (10 mg total) by mouth daily as needed for muscle spasms.   ferrous sulfate 325 (65 FE) MG tablet Take 325 mg by mouth daily with breakfast.   fluticasone 50 MCG/ACT nasal spray Commonly known as:  FLONASE Place 1 spray into both nostrils 2 (two) times daily. What changed:    when to take this  reasons  to take this   folic acid 1 MG tablet Commonly known as:  FOLVITE Take 1 tablet (1 mg total) by mouth daily.   gabapentin 100 MG capsule Commonly known as:  NEURONTIN Take 2 capsules (200 mg total) by mouth 2 (two) times daily.   ibuprofen 400 MG tablet Commonly known as:  MOTRIN IB Take 1 tablet (400 mg total) by mouth every 6  (six) hours as needed for moderate pain (neck pain).   lidocaine 5 % Commonly known as:  LIDODERM Place 1 patch onto the skin daily. Remove & Discard patch within 12 hours or as directed by MD What changed:    when to take this  reasons to take this  additional instructions   magnesium oxide 400 (241.3 Mg) MG tablet Commonly known as:  MAG-OX Take 1 tablet (400 mg total) by mouth daily. Start taking on:  04/12/2017   metoprolol succinate 25 MG 24 hr tablet Commonly known as:  TOPROL XL Take 1 tablet (25 mg total) by mouth daily.   multivitamin with minerals Tabs tablet Take 1 tablet by mouth daily.   pantoprazole 40 MG tablet Commonly known as:  PROTONIX Take 1 tablet (40 mg total) by mouth daily.   potassium chloride SA 20 MEQ tablet Commonly known as:  K-DUR,KLOR-CON Take 1 tablet (20 mEq total) by mouth daily. What changed:    medication strength  how much to take   prednisoLONE acetate 1 % ophthalmic suspension Commonly known as:  PRED FORTE Place 1 drop into both eyes 3 (three) times daily.   promethazine 25 MG tablet Commonly known as:  PHENERGAN TAKE 1 TABLET DAILY AS NEEDED FOR NAUSEA What changed:    how much to take  how to take this  when to take this  reasons to take this  additional instructions   QUEtiapine 300 MG tablet Commonly known as:  SEROQUEL Take 1 tablet (300 mg total) by mouth at bedtime.   spironolactone 25 MG tablet Commonly known as:  ALDACTONE Take 1 tablet (25 mg total) daily by mouth.   thiamine 100 MG tablet Commonly known as:  VITAMIN B-1 Take 100 mg by mouth daily. Reported on 02/17/2015   traZODone 50 MG tablet Commonly known as:  DESYREL Take 1 tablet (50 mg total) by mouth at bedtime.   VITAMIN B-12 PO Take 1 tablet by mouth daily.       Contact information for follow-up providers    Elby Beck, FNP. Schedule an appointment as soon as possible for a visit in 1 week(s).   Specialties:  Nurse  Practitioner, Family Medicine Contact information: [place] [address] [phone]            Contact information for after-discharge care    Destination    [facility] .   Service:  Skilled Nursing Contact information: [address] [place] [phone]                 Allergies  Allergen Reactions  . Nifedipine Swelling    11/02/2011 pt denies this allergy.    Consultations:  None   Procedures/Studies: Ct Head Wo Contrast  Result Date: 04/07/2017 CLINICAL DATA:  Fall EXAM: CT HEAD WITHOUT CONTRAST CT CERVICAL SPINE WITHOUT CONTRAST TECHNIQUE: Multidetector CT imaging of the head and cervical spine was performed following the standard protocol without intravenous contrast. Multiplanar CT image reconstructions of the cervical spine were also generated. COMPARISON:  10/23/2016 FINDINGS:  CT HEAD FINDINGS Brain: There is atrophy and chronic small vessel disease changes. No acute intracranial abnormality. Specifically, no hemorrhage, hydrocephalus, mass lesion, acute infarction, or significant intracranial injury. Vascular: No hyperdense vessel or unexpected calcification. Skull: No acute calvarial abnormality. Sinuses/Orbits: Mucosal thickening in the left frontal sinus and ethmoid air cells. No air-fluid levels. Mastoid air cells are clear. Orbital soft tissues unremarkable. Other: Skin laceration and skin staples noted posteriorly. CT CERVICAL SPINE FINDINGS Alignment: Normal Skull base and vertebrae: Remote type 2 odontoid fracture with nonunion. Posterior cerclage wires at C1-2 and posterior fusion changes at C1-2, stable. Fracture through the spinous process at C5 appears acute. No vertebral body fracture. Soft tissues and spinal canal: No prevertebral fluid or swelling. No visible canal hematoma. Disc levels:  Mild diffuse degenerative disc and facet disease. Upper chest: No acute findings Other: No acute  findings. Carotid bulb calcifications. IMPRESSION: No acute intracranial abnormality. Atrophy, chronic microvascular disease. Remote odontoid fracture with nonunion. Posterior fusion changes at C1-2. Findings are stable. Acute appearing fracture through the C5 spinous process. Electronically Signed   By: Rolm Baptise M.D.   On: 04/07/2017 11:20   Ct Cervical Spine Wo Contrast  Result Date: 04/07/2017 CLINICAL DATA:  Fall EXAM: CT HEAD WITHOUT CONTRAST CT CERVICAL SPINE WITHOUT CONTRAST TECHNIQUE: Multidetector CT imaging of the head and cervical spine was performed following the standard protocol without intravenous contrast. Multiplanar CT image reconstructions of the cervical spine were also generated. COMPARISON:  10/23/2016 FINDINGS: CT HEAD FINDINGS Brain: There is atrophy and chronic small vessel disease changes. No acute intracranial abnormality. Specifically, no hemorrhage, hydrocephalus, mass lesion, acute infarction, or significant intracranial injury. Vascular: No hyperdense vessel or unexpected calcification. Skull: No acute calvarial abnormality. Sinuses/Orbits: Mucosal thickening in the left frontal sinus and ethmoid air cells. No air-fluid levels. Mastoid air cells are clear. Orbital soft tissues unremarkable. Other: Skin laceration and skin staples noted posteriorly. CT CERVICAL SPINE FINDINGS Alignment: Normal Skull base and vertebrae: Remote type 2 odontoid fracture with nonunion. Posterior cerclage wires at C1-2 and posterior fusion changes at C1-2, stable. Fracture through the spinous process at C5 appears acute. No vertebral body fracture. Soft tissues and spinal canal: No prevertebral fluid or swelling. No visible canal hematoma. Disc levels:  Mild diffuse degenerative disc and facet disease. Upper chest: No acute findings Other: No acute findings. Carotid bulb calcifications. IMPRESSION: No acute intracranial abnormality. Atrophy, chronic microvascular disease. Remote odontoid fracture  with nonunion. Posterior fusion changes at C1-2. Findings are stable. Acute appearing fracture through the C5 spinous process. Electronically Signed   By: Rolm Baptise M.D.   On: 04/07/2017 11:20   (Echo, Carotid, EGD, Colonoscopy, ERCP)    Subjective: Patient seen and examined the bedside this morning.  Remains comfortable.  No new issues/events.  Stable for discharge to skilled nursing facility today.   Discharge Exam: Vitals:   04/10/17 2125 04/11/17 0504  BP: 124/62 135/74  Pulse: 78 74  Resp: 19 20  Temp: 98.4 F (36.9 C) 98.1 F (36.7 C)  SpO2: 100% 99%   Vitals:   04/10/17 0508 04/10/17 1400 04/10/17 2125 04/11/17 0504  BP: 126/77 127/76 124/62 135/74  Pulse: 79 89 78 74  Resp: 20 16 19 20   Temp: 98 F (36.7 C) 98.8 F (37.1 C) 98.4 F (36.9 C) 98.1 F (36.7 C)  TempSrc: Oral Oral Oral Oral  SpO2: 100% 98% 100% 99%  Weight:      Height:        General:  Pt is alert, awake, not in acute distress Cardiovascular: RRR, S1/S2 +, no rubs, no gallops Respiratory: CTA bilaterally, no wheezing, no rhonchi Abdominal: Soft, NT,Mildly distended, bowel sounds + Extremities: no edema, no cyanosis    The results of significant diagnostics from this hospitalization (including imaging, microbiology, ancillary and laboratory) are listed below for reference.     Microbiology: Recent Results (from the past 240 hour(s))  MRSA PCR Screening     Status: Abnormal   Collection Time: 04/10/17  9:58 AM  Result Value Ref Range Status   MRSA by PCR POSITIVE (A) NEGATIVE Final    Comment:        The GeneXpert MRSA Assay (FDA approved for NASAL specimens only), is one component of a comprehensive MRSA colonization surveillance program. It is not intended to diagnose MRSA infection nor to guide or monitor treatment for MRSA infections. RESULT CALLED TO, READ BACK BY AND VERIFIED WITH: E.COUDLE RN 04/10/2017 1446 JR Performed at [facility], [place] [address] [place], [place] [address]      Labs: BNP (last 3 results) No results for input(s): BNP in the last 8760 hours. Basic Metabolic Panel: Recent Labs  Lab 04/07/17 0956 04/08/17 0559 04/09/17 0554 04/10/17 0516 04/11/17 0553  NA 122* 127* 126* 129* 130*  K 4.8 4.0 3.7 3.7 3.3*  CL 83* 94* 93* 95* 98*  CO2 18* 25 23 26 25   GLUCOSE 103* 112* 103* 102* 96  BUN 7 7 5* 7 8  CREATININE 0.93 0.85 0.83 0.75 0.67  CALCIUM 8.8* 8.4* 8.4* 8.4* 8.1*  MG  --   --  1.5* 1.7  --    Liver Function Tests: Recent Labs  Lab 04/08/17 0559  AST 33  ALT 22  ALKPHOS 83  BILITOT 0.9  PROT 6.0*  ALBUMIN 3.6   No results for input(s): LIPASE, AMYLASE in the last 168 hours. No results for input(s): AMMONIA in the last 168 hours. CBC: Recent Labs  Lab 04/07/17 0956 04/08/17 0559 04/09/17 0554  WBC 6.6 4.5 5.3  NEUTROABS 5.2  --   --   HGB 11.0* 9.2* 8.4*  HCT 30.5* 25.9* 23.6*  MCV 90.0 89.9 92.2  PLT 89* 75* 76*   Cardiac Enzymes: No results for input(s): CKTOTAL, CKMB, CKMBINDEX, TROPONINI in the last 168 hours. BNP: Invalid input(s): POCBNP CBG: No results for input(s): GLUCAP in the last 168 hours. D-Dimer No results for input(s): DDIMER in the last 72 hours. Hgb A1c No results for input(s): HGBA1C in the last 72 hours. Lipid Profile No results for input(s): CHOL, HDL, LDLCALC, TRIG, CHOLHDL, LDLDIRECT in the last 72 hours. Thyroid function studies No results for input(s): TSH, T4TOTAL, T3FREE, THYROIDAB in the last 72 hours.  Invalid input(s): FREET3 Anemia work up No results for input(s): VITAMINB12, FOLATE, FERRITIN, TIBC, IRON, RETICCTPCT in the last 72 hours. Urinalysis    Component Value Date/Time   COLORURINE YELLOW 05/17/2016 1416   APPEARANCEUR CLEAR 05/17/2016 1416   LABSPEC 1.017 05/17/2016 1416   PHURINE 7.0 05/17/2016 1416   GLUCOSEU NEGATIVE 05/17/2016 1416   HGBUR NEGATIVE 05/17/2016 1416   BILIRUBINUR NEGATIVE 05/17/2016 1416   BILIRUBINUR neg  11/17/2010 1210   KETONESUR NEGATIVE 05/17/2016 1416   PROTEINUR NEGATIVE 05/17/2016 1416   UROBILINOGEN 1.0 11/17/2011 1451   NITRITE NEGATIVE 05/17/2016 1416   LEUKOCYTESUR NEGATIVE 05/17/2016 1416   Sepsis Labs Invalid input(s): PROCALCITONIN,  WBC,  LACTICIDVEN Microbiology Recent Results (from the past 240 hour(s))  MRSA PCR Screening  Status: Abnormal   Collection Time: 04/10/17  9:58 AM  Result Value Ref Range Status   MRSA by PCR POSITIVE (A) NEGATIVE Final    Comment:        The GeneXpert MRSA Assay (FDA approved for NASAL specimens only), is one component of a comprehensive MRSA colonization surveillance program. It is not intended to diagnose MRSA infection nor to guide or monitor treatment for MRSA infections. RESULT CALLED TO, READ BACK BY AND VERIFIED WITH: E.COUDLE RN 04/10/2017 1446 JR Performed at [facility], [place] [address] [place], [place] [address]      Time coordinating discharge: Over 30 minutes  SIGNED:   Shelly Coss, MD  [facility] 04/11/2017, 11:40 AM Pager [phone]  If 7PM-7AM, please contact night-coverage [email or web address] Password [email or web address]

## 2017-04-11 NOTE — Progress Notes (Signed)
Report called to [facility] 

## 2017-04-11 NOTE — Progress Notes (Signed)
Occupational Therapy Treatment Patient Details Name: [name] MRN: [id number] DOB: [birth year] Today's Date: 04/11/2017    History of present illness [name] is a 72 y.o. male with medical history significant of alcohol abuse, hypertension, dilated cardiomyopathy last echo 05/2016 ejection fraction 55-60%, memory  loss probably related to alcohol and was getting up from the car and got lightheaded and fell backward and hit the back of his head with a resulting laceration.      Follow Up Recommendations  Supervision/Assistance - 24 hour;SNF    Equipment Recommendations  (defer next venue)    Recommendations for Other Services      Precautions / Restrictions Precautions Precautions: Fall Restrictions Weight Bearing Restrictions: No       Mobility Bed Mobility Overal bed mobility: Needs Assistance Bed Mobility: Supine to Sit     Supine to sit: Supervision     General bed mobility comments: HOB elevated, incr time needed  Transfers Overall transfer level: Needs assistance Equipment used: Rolling walker (2 wheeled) Transfers: Sit to/from Stand Sit to Stand: Min assist         General transfer comment: steady assist to rise from bed and cues for safety.     Balance Overall balance assessment: History of Falls;Needs assistance Sitting-balance support: Feet supported;Single extremity supported;No upper extremity supported Sitting balance-Leahy Scale: Good     Standing balance support: Bilateral upper extremity supported;During functional activity Standing balance-Leahy Scale: Fair Standing balance comment: fair static; requires UE support for dynamic balance                           ADL either performed or assessed with clinical judgement   ADL       Grooming: Wash/dry hands;Set up;Sitting               Lower Body Dressing: Minimal assistance;Sit to/from stand;Cueing for safety;Cueing for sequencing   Toilet Transfer: Minimal  assistance;RW;Comfort height toilet;Regular Toilet;Ambulation   Toileting- Clothing Manipulation and Hygiene: Minimal assistance;Sit to/from stand               Vision Patient Visual Report: No change from baseline     Perception     Praxis      Cognition Arousal/Alertness: Awake/alert Behavior During Therapy: WFL for tasks assessed/performed;Flat affect Overall Cognitive Status: Impaired/Different from baseline                                          Exercises     Shoulder Instructions       General Comments      Pertinent Vitals/ Pain       Pain Assessment: No/denies pain  Home Living                                          Prior Functioning/Environment              Frequency  Min 2X/week        Progress Toward Goals  OT Goals(current goals can now be found in the care plan section)  Progress towards OT goals: Progressing toward goals  Acute Rehab OT Goals Patient Stated Goal: agreeable to get up and walk.  Plan Discharge plan remains appropriate    Co-evaluation  Reason for Co-Treatment: To address functional/ADL transfers PT goals addressed during session: Mobility/safety with mobility;Proper use of DME;Balance        AM-PAC PT "6 Clicks" Daily Activity     Outcome Measure   Help from another person eating meals?: None Help from another person taking care of personal grooming?: A Little Help from another person toileting, which includes using toliet, bedpan, or urinal?: A Little Help from another person bathing (including washing, rinsing, drying)?: A Little Help from another person to put on and taking off regular upper body clothing?: A Little Help from another person to put on and taking off regular lower body clothing?: A Lot 6 Click Score: 18    End of Session Equipment Utilized During Treatment: Gait belt;Rolling walker  OT Visit Diagnosis: Unsteadiness on feet (R26.81);Muscle  weakness (generalized) (M62.81)   Activity Tolerance Patient tolerated treatment well   Patient Left in chair;with call bell/phone within reach;with chair alarm set   Nurse Communication Mobility status        Time: 7001-7494 OT Time Calculation (min): 9 min  Charges: OT General Charges $OT Visit: 1 Visit OT Treatments $Self Care/Home Management : 8-22 mins  [place], [place]   Betsy Pries 04/11/2017, 12:59 PM

## 2017-04-11 NOTE — Care Management Important Message (Signed)
Important Message  Patient Details  Name: [name] MRN: [id number] Date of Birth: [birth year]   Medicare Important Message Given:  Yes    Kerin Salen 04/11/2017, 11:09 AMImportant Message  Patient Details  Name: [name] MRN: [id number] Date of Birth: [birth year]   Medicare Important Message Given:  Yes    Kerin Salen 04/11/2017, 11:09 AM

## 2017-04-11 NOTE — Progress Notes (Signed)
Physical Therapy Treatment Patient Details Name: [name] MRN: [id number] DOB: 11-28-1945 Today's Date: 04/11/2017    History of Present Illness [name] is a 72 y.o. male with medical history significant of alcohol abuse, hypertension, dilated cardiomyopathy last echo 05/2016 ejection fraction 55-60%, memory  loss probably related to alcohol and was getting up from the car and got lightheaded and fell backward and hit the back of his head with a resulting laceration.    PT Comments    Pt progressing well; continue to recommend SNF post acute as pt is at risk for continued falls  Follow Up Recommendations  SNF     Equipment Recommendations  None recommended by PT    Recommendations for Other Services       Precautions / Restrictions Precautions Precautions: Fall Restrictions Weight Bearing Restrictions: No    Mobility  Bed Mobility Overal bed mobility: Needs Assistance Bed Mobility: Supine to Sit     Supine to sit: Supervision     General bed mobility comments: HOB elevated, incr time needed  Transfers Overall transfer level: Needs assistance Equipment used: Rolling walker (2 wheeled) Transfers: Sit to/from Stand Sit to Stand: Min assist         General transfer comment: steady assist to rise from bed and cues for safety.   Ambulation/Gait Ambulation/Gait assistance: Min guard;Min assist Ambulation Distance (Feet): 180 Feet Assistive device: Rolling walker (2 wheeled) Gait Pattern/deviations: Step-to pattern;Step-through pattern     General Gait Details: cues for RW safety, min to min/guard to steady during turns   Science writer    Modified Rankin (Stroke Patients Only)       Balance Overall balance assessment: History of Falls;Needs assistance Sitting-balance support: Feet supported;Single extremity supported;No upper extremity supported Sitting balance-Leahy Scale: Good     Standing balance support:  Bilateral upper extremity supported;During functional activity Standing balance-Leahy Scale: Fair Standing balance comment: fair static; requires UE support for dynamic balance                            Cognition Arousal/Alertness: Awake/alert Behavior During Therapy: WFL for tasks assessed/performed;Flat affect Overall Cognitive Status: Impaired/Different from baseline                                        Exercises      General Comments        Pertinent Vitals/Pain Pain Assessment: No/denies pain    Home Living                      Prior Function            PT Goals (current goals can now be found in the care plan section) Acute Rehab PT Goals Patient Stated Goal: agreeable to get up and walk. PT Goal Formulation: With patient/family Time For Goal Achievement: 04/23/17 Potential to Achieve Goals: Good Progress towards PT goals: Progressing toward goals    Frequency    Min 2X/week      PT Plan Current plan remains appropriate    Co-evaluation PT/OT/SLP Co-Evaluation/Treatment: Yes Reason for Co-Treatment: To address functional/ADL transfers PT goals addressed during session: Mobility/safety with mobility;Proper use of DME;Balance        AM-PAC PT "6 Clicks" Daily Activity  Outcome Measure  Difficulty turning over in bed (including adjusting bedclothes, sheets and blankets)?: A Little Difficulty moving from lying on back to sitting on the side of the bed? : A Little Difficulty sitting down on and standing up from a chair with arms (e.g., wheelchair, bedside commode, etc,.)?: A Little Help needed moving to and from a bed to chair (including a wheelchair)?: A Little Help needed walking in hospital room?: A Little Help needed climbing 3-5 steps with a railing? : A Lot 6 Click Score: 17    End of Session Equipment Utilized During Treatment: Gait belt Activity Tolerance: Patient tolerated treatment well Patient left:  in chair;with call bell/phone within reach;with chair alarm set Nurse Communication: Mobility status PT Visit Diagnosis: Unsteadiness on feet (R26.81);History of falling (Z91.81)     Time: 1140-1153 PT Time Calculation (min) (ACUTE ONLY): 13 min  Charges:  $Gait Training: 8-22 mins                    G CodesKenyon Ana, PT Pager: [phone] 04/11/2017    [facility] 04/11/2017, 12:51 PM

## 2017-04-11 NOTE — Clinical Social Work Placement (Signed)
   12:42 PM Patient and family chose bed at [facility].  LCSW confirmed bed with facility.   LCSW faxed dc docs to facility.   Patient will transport by [facility].   LCSW notified family of transport.   RN report number: [id number]  BKJ   CLINICAL SOCIAL WORK PLACEMENT  NOTE  Date:  04/11/2017  Patient Details  Name: [name] MRN: [id number] Date of Birth: [birth year]  Clinical Social Work is seeking post-discharge placement for this patient at the [place] level of care (*CSW will initial, date and re-position this form in  chart as items are completed):  Yes   Patient/family provided with [place] Work Department's list of facilities offering this level of care within the geographic area requested by the patient (or if unable, by the patient's family).  Yes   Patient/family informed of their freedom to choose among providers that offer the needed level of care, that participate in Medicare, Medicaid or managed care program needed by the patient, have an available bed and are willing to accept the patient.  Yes   Patient/family informed of [facility]'s ownership interest in [facility] and [facility], as well as of the fact that they are under no obligation to receive care at these facilities.  PASRR submitted to EDS on       PASRR number received on 04/11/17     Existing PASRR number confirmed on       FL2 transmitted to all facilities in geographic area requested by pt/family on 04/10/17     FL2 transmitted to all facilities within larger geographic area on       Patient informed that his/her managed care company has contracts with or will negotiate with certain facilities, including the following:        Yes   Patient/family informed of bed offers received.  Patient chooses bed at [facility]     Physician recommends and patient chooses bed at      Patient to be transferred to [facility] on 04/11/17.  Patient  to be transferred to facility by EMS     Patient family notified on 04/11/17 of transfer.  Name of family member notified:  [name]     PHYSICIAN       Additional Comment:    _______________________________________________ Servando Snare, LCSW 04/11/2017, 12:27 PM

## 2017-04-11 NOTE — Progress Notes (Signed)
Patient has bed at [facility]. Patient has PASSR.   Patient will go to SNF at dc.    Carolin Coy Gearhart [place]

## 2017-04-12 DIAGNOSIS — W19XXXA Unspecified fall, initial encounter: Secondary | ICD-10-CM | POA: Diagnosis not present

## 2017-04-12 DIAGNOSIS — R52 Pain, unspecified: Secondary | ICD-10-CM | POA: Diagnosis not present

## 2017-04-12 DIAGNOSIS — I10 Essential (primary) hypertension: Secondary | ICD-10-CM | POA: Diagnosis not present

## 2017-04-22 DIAGNOSIS — F29 Unspecified psychosis not due to a substance or known physiological condition: Secondary | ICD-10-CM | POA: Diagnosis not present

## 2017-04-22 DIAGNOSIS — G4700 Insomnia, unspecified: Secondary | ICD-10-CM | POA: Diagnosis not present

## 2017-04-22 DIAGNOSIS — F329 Major depressive disorder, single episode, unspecified: Secondary | ICD-10-CM | POA: Diagnosis not present

## 2017-04-22 DIAGNOSIS — R197 Diarrhea, unspecified: Secondary | ICD-10-CM | POA: Diagnosis not present

## 2017-04-22 DIAGNOSIS — L03811 Cellulitis of head [any part, except face]: Secondary | ICD-10-CM | POA: Diagnosis not present

## 2017-05-02 DIAGNOSIS — R2689 Other abnormalities of gait and mobility: Secondary | ICD-10-CM | POA: Diagnosis not present

## 2017-05-02 DIAGNOSIS — M6281 Muscle weakness (generalized): Secondary | ICD-10-CM | POA: Diagnosis not present

## 2017-05-02 DIAGNOSIS — M25552 Pain in left hip: Secondary | ICD-10-CM | POA: Diagnosis not present

## 2017-05-02 DIAGNOSIS — Z471 Aftercare following joint replacement surgery: Secondary | ICD-10-CM | POA: Diagnosis not present

## 2017-05-04 DIAGNOSIS — M6281 Muscle weakness (generalized): Secondary | ICD-10-CM | POA: Diagnosis not present

## 2017-05-04 DIAGNOSIS — Z471 Aftercare following joint replacement surgery: Secondary | ICD-10-CM | POA: Diagnosis not present

## 2017-05-04 DIAGNOSIS — R2689 Other abnormalities of gait and mobility: Secondary | ICD-10-CM | POA: Diagnosis not present

## 2017-05-04 DIAGNOSIS — M25552 Pain in left hip: Secondary | ICD-10-CM | POA: Diagnosis not present

## 2017-05-06 DIAGNOSIS — Z471 Aftercare following joint replacement surgery: Secondary | ICD-10-CM | POA: Diagnosis not present

## 2017-05-06 DIAGNOSIS — R2689 Other abnormalities of gait and mobility: Secondary | ICD-10-CM | POA: Diagnosis not present

## 2017-05-06 DIAGNOSIS — M6281 Muscle weakness (generalized): Secondary | ICD-10-CM | POA: Diagnosis not present

## 2017-05-06 DIAGNOSIS — M25552 Pain in left hip: Secondary | ICD-10-CM | POA: Diagnosis not present

## 2017-05-09 ENCOUNTER — Encounter: Payer: Self-pay | Admitting: Family Medicine

## 2017-05-09 ENCOUNTER — Ambulatory Visit (INDEPENDENT_AMBULATORY_CARE_PROVIDER_SITE_OTHER): Payer: Medicare Other | Admitting: Family Medicine

## 2017-05-09 VITALS — BP 118/78 | HR 98 | Temp 97.8°F | Ht 69.0 in | Wt 188.5 lb

## 2017-05-09 DIAGNOSIS — K7030 Alcoholic cirrhosis of liver without ascites: Secondary | ICD-10-CM

## 2017-05-09 DIAGNOSIS — F321 Major depressive disorder, single episode, moderate: Secondary | ICD-10-CM

## 2017-05-09 DIAGNOSIS — E876 Hypokalemia: Secondary | ICD-10-CM | POA: Diagnosis not present

## 2017-05-09 DIAGNOSIS — F1010 Alcohol abuse, uncomplicated: Secondary | ICD-10-CM

## 2017-05-09 DIAGNOSIS — D509 Iron deficiency anemia, unspecified: Secondary | ICD-10-CM | POA: Diagnosis not present

## 2017-05-09 LAB — CBC WITH DIFFERENTIAL/PLATELET
Basophils Absolute: 0 10*3/uL (ref 0.0–0.1)
Basophils Relative: 0.5 % (ref 0.0–3.0)
Eosinophils Absolute: 0 10*3/uL (ref 0.0–0.7)
Eosinophils Relative: 0.7 % (ref 0.0–5.0)
HCT: 33.4 % — ABNORMAL LOW (ref 39.0–52.0)
Hemoglobin: 11.4 g/dL — ABNORMAL LOW (ref 13.0–17.0)
LYMPHS ABS: 1 10*3/uL (ref 0.7–4.0)
Lymphocytes Relative: 14.8 % (ref 12.0–46.0)
MCHC: 34.2 g/dL (ref 30.0–36.0)
MCV: 89.9 fl (ref 78.0–100.0)
MONO ABS: 0.6 10*3/uL (ref 0.1–1.0)
MONOS PCT: 9.6 % (ref 3.0–12.0)
NEUTROS ABS: 4.8 10*3/uL (ref 1.4–7.7)
NEUTROS PCT: 74.4 % (ref 43.0–77.0)
Platelets: 164 10*3/uL (ref 150.0–400.0)
RBC: 3.71 Mil/uL — AB (ref 4.22–5.81)
RDW: 14.1 % (ref 11.5–15.5)
WBC: 6.5 10*3/uL (ref 4.0–10.5)

## 2017-05-09 LAB — COMPREHENSIVE METABOLIC PANEL
ALK PHOS: 75 U/L (ref 39–117)
ALT: 8 U/L (ref 0–53)
AST: 18 U/L (ref 0–37)
Albumin: 3.7 g/dL (ref 3.5–5.2)
BUN: 9 mg/dL (ref 6–23)
CHLORIDE: 94 meq/L — AB (ref 96–112)
CO2: 26 mEq/L (ref 19–32)
Calcium: 8.7 mg/dL (ref 8.4–10.5)
Creatinine, Ser: 0.9 mg/dL (ref 0.40–1.50)
GFR: 88.48 mL/min (ref >60.00)
GLUCOSE: 94 mg/dL (ref 70–99)
POTASSIUM: 4.6 meq/L (ref 3.5–5.1)
Sodium: 131 mEq/L — ABNORMAL LOW (ref 135–145)
TOTAL PROTEIN: 6.4 g/dL (ref 6.0–8.3)
Total Bilirubin: 0.4 mg/dL (ref 0.2–1.2)

## 2017-05-09 MED ORDER — ESCITALOPRAM OXALATE 10 MG PO TABS: 10.0000 mg | ORAL_TABLET | Freq: Every day | ORAL | 3 refills | Status: DC

## 2017-05-09 NOTE — Progress Notes (Signed)
Subjective:    Patient ID: [name], male    DOB: [birth year], 72 y.o.   MRN: [id number]  HPI This is a 72 yo male who presents today for follow up of fall and subsequent hospitalization 3/28-04/11/17. He was coming into the office to see Dr. Lorelei Pont when he fell and lacerated his head. He was found to have a C5 spinous process fracture and was admitted for management of alcohol withdrawal. Has cut back on alcohol to 2 glasses of wine daily. Has not fallen since discharge home.   Went to [facility] for rehab and is continuing to go to outpatient rehab twice a week.   Feels like mood is better. He was started on escitalopram 5 mg. Less testy. Sleeping ok, better on days he has rehab.   No change in breathing, no chest pain, no pedal edema.   Past Medical History:  Diagnosis Date  . Adenomatous polyp of colon 2006  . Anemia 2012  . Anxiety   . Arthritis    "left arm/shoulder" (05/17/2016)  . Atrial tachycardia, paroxysmal ([place]) 04/07/2015  . BPH (benign prostatic hypertrophy) 5/99  . Chronic bronchitis ([place])    "q year or q other year" (11/02/2011)  . Chronic nausea    "a few days/week" (05/17/2016)  . Colon polyp   . DCM (dilated cardiomyopathy) ([place])    EF 45-50% by echo and cath 2017  . Depression    Hx of  . Falls frequently    "daily lately; legs just give out" (11/02/2011; 05/17/2016)  . GERD (gastroesophageal reflux disease) 07/31/04   gastritis   . H/O hiatal hernia   . Hard of hearing   . Heart murmur   . Hernia, umbilical    "unrepaired" (11/02/2011; 05/17/2016)  . History of blood transfusion 2012  . HLD (hyperlipidemia) 5/99  . HTN (hypertension) 5/99  . Memory loss    "recently has been forgetting alot; maybe related to the alcohol" (11/02/2011; 05/17/2016)  . Mild CAD    10% RCA  . PAC (premature atrial contraction) 04/07/2015  . Peripheral vascular disease ([place])   . Pneumonia 1996   hosp  . Pollen allergy    pollen/ragweed  . Seasonal allergies    Past  Surgical History:  Procedure Laterality Date  . BACK SURGERY    . CARDIAC CATHETERIZATION N/A 05/23/2015   Procedure: Left Heart Cath and Coronary Angiography;  Surgeon: Troy Sine, MD;  Location: [facility];  Service: Cardiovascular;  Laterality: N/A;  . CATARACT EXTRACTION W/ INTRAOCULAR LENS  IMPLANT, BILATERAL Bilateral 10/2002  . COLONOSCOPY  07/31/04   multiple polyps; repeat in 3 years  . DUPUYTREN CONTRACTURE RELEASE Left   . ETT myoview  03/09/07   nml EF 66%  . FRACTURE SURGERY    . HEMORRHOID SURGERY     "cauterized a long time ago" (11/02/2011)  . hosp CP R/O'D 2/24  03/08/07  . neck MRI  6/04   C5-6 disc abnormality  . ORIF HUMERUS FRACTURE  11/04/2011   Procedure: OPEN REDUCTION INTERNAL FIXATION (ORIF) PROXIMAL HUMERUS FRACTURE;  Surgeon: Rozanna Box, MD;  Location: [facility] [place];  Service: Orthopedics;  Laterality: Left;  . POSTERIOR CERVICAL FUSION/FORAMINOTOMY N/A 03/21/2012   Procedure: POSTERIOR CERVICAL FUSION/FORAMINOTOMY LEVEL ONE;  Surgeon: Charlie Pitter, MD;  Location: [place];  Service: Neurosurgery;  Laterality: N/A;  POSTERIOR CERVICAL ONE TO CERVICAL TWO FUSION WITH ILIAC CREST GRAFT AND LATERAL MASS SCREWS   . TONSILLECTOMY     "  I was young" (11/02/2011)  . TOTAL HIP ARTHROPLASTY Left 10/24/2016   Procedure: TOTAL HIP ARTHROPLASTY ANTERIOR APPROACH;  Surgeon: Rod Can, MD;  Location: [facility];  Service: Orthopedics;  Laterality: Left;  . UPPER GASTROINTESTINAL ENDOSCOPY     Family History  Problem Relation Age of Onset  . Heart disease Father   . Diabetes Father   . Stroke Father   . Depression Mother   . Heart disease Brother   . Alcohol abuse Brother   . Liver disease Brother    Social History   Tobacco Use  . Smoking status: Former Smoker    Types: Pipe  . Smokeless tobacco: Never Used  . Tobacco comment: 05/17/2016 "stopped in the late 1990s"  Substance Use Topics  . Alcohol use: Yes    Alcohol/week: 67.2 oz    Types: 112  Glasses of wine per week    Comment: 11/02/2011 "large box of wine q 2 days" 01-27-15 2 glass a day of wine; 05/17/2016 "1.75L bottles; 1- 1 1/2 bottles/day; somedays worse than others; at least 3-4 days/week he'll have 1 1/2 bottles; last drink was 05/16/2016" (05/17/2016)  . Drug use: No      Review of Systems Per HPI    Objective:   Physical Exam  Constitutional: He appears well-developed and well-nourished.  HENT:  Head: Normocephalic and atraumatic.  Back of head with well healed scar.   Cardiovascular: Normal rate.  Pulmonary/Chest: Effort normal and breath sounds normal.  Musculoskeletal: He exhibits no edema.  Psychiatric: He has a normal mood and affect. His behavior is normal. Judgment and thought content normal.  Vitals reviewed.     BP 118/78 (BP Location: Left Arm, Patient Position: Sitting, Cuff Size: Normal)   Pulse 98   Temp 97.8 F (36.6 C) (Oral)   Ht 5\' 9"  (1.753 m)   Wt 188 lb 8 oz (85.5 kg)   SpO2 93%   BMI 27.84 kg/m  Wt Readings from Last 3 Encounters:  05/09/17 188 lb 8 oz (85.5 kg)  04/07/17 180 lb (81.6 kg)  03/30/17 178 lb 8 oz (81 kg)       Assessment & Plan:  1. Iron deficiency anemia, unspecified iron deficiency anemia type - CBC with Differential  2. Hypokalemia - Comprehensive metabolic panel  3. Alcoholic cirrhosis of liver without ascites (HCC) - Comprehensive metabolic panel  4. Alcohol abuse - encouraged abstinence and replacing alcohol use with another activity, increased exercise  5. Current moderate episode of major depressive disorder - will increase escitalopram to 10 mg po qd  - follow up in 2 months   [name], FNP-BC  [facility], [place]  05/09/2017 10:02 AM

## 2017-05-09 NOTE — Patient Instructions (Signed)
Good to see you today  Please schedule a follow up in 2 months

## 2017-05-11 DIAGNOSIS — R2689 Other abnormalities of gait and mobility: Secondary | ICD-10-CM | POA: Diagnosis not present

## 2017-05-11 DIAGNOSIS — M6281 Muscle weakness (generalized): Secondary | ICD-10-CM | POA: Diagnosis not present

## 2017-05-13 DIAGNOSIS — R2689 Other abnormalities of gait and mobility: Secondary | ICD-10-CM | POA: Diagnosis not present

## 2017-05-13 DIAGNOSIS — M6281 Muscle weakness (generalized): Secondary | ICD-10-CM | POA: Diagnosis not present

## 2017-05-16 DIAGNOSIS — M6281 Muscle weakness (generalized): Secondary | ICD-10-CM | POA: Diagnosis not present

## 2017-05-16 DIAGNOSIS — R2689 Other abnormalities of gait and mobility: Secondary | ICD-10-CM | POA: Diagnosis not present

## 2017-05-20 DIAGNOSIS — M6281 Muscle weakness (generalized): Secondary | ICD-10-CM | POA: Diagnosis not present

## 2017-05-20 DIAGNOSIS — R2689 Other abnormalities of gait and mobility: Secondary | ICD-10-CM | POA: Diagnosis not present

## 2017-06-01 DIAGNOSIS — R2689 Other abnormalities of gait and mobility: Secondary | ICD-10-CM | POA: Diagnosis not present

## 2017-06-01 DIAGNOSIS — M6281 Muscle weakness (generalized): Secondary | ICD-10-CM | POA: Diagnosis not present

## 2017-06-03 DIAGNOSIS — R2689 Other abnormalities of gait and mobility: Secondary | ICD-10-CM | POA: Diagnosis not present

## 2017-06-03 DIAGNOSIS — M6281 Muscle weakness (generalized): Secondary | ICD-10-CM | POA: Diagnosis not present

## 2017-06-14 ENCOUNTER — Other Ambulatory Visit: Payer: Self-pay | Admitting: Internal Medicine

## 2017-06-21 ENCOUNTER — Other Ambulatory Visit: Payer: Self-pay | Admitting: Internal Medicine

## 2017-06-22 ENCOUNTER — Other Ambulatory Visit: Payer: Self-pay | Admitting: Family Medicine

## 2017-06-22 DIAGNOSIS — G4700 Insomnia, unspecified: Secondary | ICD-10-CM

## 2017-06-22 DIAGNOSIS — J309 Allergic rhinitis, unspecified: Secondary | ICD-10-CM

## 2017-06-22 MED ORDER — FLUTICASONE PROPIONATE 50 MCG/ACT NA SUSP: 1.0000 | Freq: Two times a day (BID) | NASAL | 5 refills | Status: DC | PRN

## 2017-06-22 MED ORDER — TRAZODONE HCL 50 MG PO TABS: 50.0000 mg | ORAL_TABLET | Freq: Every day | ORAL | 1 refills | Status: DC

## 2017-06-29 ENCOUNTER — Ambulatory Visit (INDEPENDENT_AMBULATORY_CARE_PROVIDER_SITE_OTHER): Payer: Medicare Other | Admitting: Family Medicine

## 2017-06-29 ENCOUNTER — Encounter: Payer: Self-pay | Admitting: Family Medicine

## 2017-06-29 VITALS — BP 158/88 | HR 86 | Temp 98.2°F | Ht 69.0 in | Wt 171.8 lb

## 2017-06-29 DIAGNOSIS — F1010 Alcohol abuse, uncomplicated: Secondary | ICD-10-CM | POA: Diagnosis not present

## 2017-06-29 DIAGNOSIS — G4700 Insomnia, unspecified: Secondary | ICD-10-CM | POA: Diagnosis not present

## 2017-06-29 DIAGNOSIS — I10 Essential (primary) hypertension: Secondary | ICD-10-CM | POA: Diagnosis not present

## 2017-06-29 DIAGNOSIS — K7030 Alcoholic cirrhosis of liver without ascites: Secondary | ICD-10-CM | POA: Diagnosis not present

## 2017-06-29 DIAGNOSIS — R634 Abnormal weight loss: Secondary | ICD-10-CM

## 2017-06-29 DIAGNOSIS — R296 Repeated falls: Secondary | ICD-10-CM

## 2017-06-29 DIAGNOSIS — D509 Iron deficiency anemia, unspecified: Secondary | ICD-10-CM

## 2017-06-29 DIAGNOSIS — S20212A Contusion of left front wall of thorax, initial encounter: Secondary | ICD-10-CM | POA: Diagnosis not present

## 2017-06-29 DIAGNOSIS — R42 Dizziness and giddiness: Secondary | ICD-10-CM

## 2017-06-29 LAB — CBC WITH DIFFERENTIAL/PLATELET
BASOS ABS: 0.1 10*3/uL (ref 0.0–0.1)
BASOS PCT: 1.4 % (ref 0.0–3.0)
EOS ABS: 0.1 10*3/uL (ref 0.0–0.7)
Eosinophils Relative: 1.5 % (ref 0.0–5.0)
HEMATOCRIT: 35.3 % — AB (ref 39.0–52.0)
Hemoglobin: 12.1 g/dL — ABNORMAL LOW (ref 13.0–17.0)
LYMPHS PCT: 19 % (ref 12.0–46.0)
Lymphs Abs: 1.1 10*3/uL (ref 0.7–4.0)
MCHC: 34.3 g/dL (ref 30.0–36.0)
MCV: 88.6 fl (ref 78.0–100.0)
MONO ABS: 0.7 10*3/uL (ref 0.1–1.0)
Monocytes Relative: 12 % (ref 3.0–12.0)
NEUTROS ABS: 3.8 10*3/uL (ref 1.4–7.7)
Neutrophils Relative %: 66.1 % (ref 43.0–77.0)
PLATELETS: 182 10*3/uL (ref 150.0–400.0)
RBC: 3.98 Mil/uL — ABNORMAL LOW (ref 4.22–5.81)
RDW: 17.6 % — AB (ref 11.5–15.5)
WBC: 5.7 10*3/uL (ref 4.0–10.5)

## 2017-06-29 LAB — COMPREHENSIVE METABOLIC PANEL
ALT: 11 U/L (ref 0–53)
AST: 18 U/L (ref 0–37)
Albumin: 4.2 g/dL (ref 3.5–5.2)
Alkaline Phosphatase: 106 U/L (ref 39–117)
BILIRUBIN TOTAL: 0.7 mg/dL (ref 0.2–1.2)
BUN: 15 mg/dL (ref 6–23)
CALCIUM: 9.4 mg/dL (ref 8.4–10.5)
CHLORIDE: 93 meq/L — AB (ref 96–112)
CO2: 28 meq/L (ref 19–32)
CREATININE: 1.1 mg/dL (ref 0.40–1.50)
GFR: 70.16 mL/min (ref >60.00)
GLUCOSE: 95 mg/dL (ref 70–99)
Potassium: 5.2 mEq/L — ABNORMAL HIGH (ref 3.5–5.1)
SODIUM: 130 meq/L — AB (ref 135–145)
Total Protein: 7.2 g/dL (ref 6.0–8.3)

## 2017-06-29 NOTE — Patient Instructions (Addendum)
Decrease your Seroquel to 1/2 tablet daily  Do not take trazadone or other over the counter sleep medicine  Use your walker or cane all the time  Do not take cyclobenzaprine (flexeril) muscle relaxer- may be making dizziness worse  Increase your fluid intake- milk, juice, water  Limit wine to 1-2 glasses a day- none would be better  Empty bladder twice, double empty and drink more liquids to decrease sensation of having to go  Follow up in 4-6 weeks

## 2017-06-29 NOTE — Progress Notes (Signed)
Subjective:    Patient ID: [name], male    DOB: 06-02-1945, 72 y.o.   MRN: [id number]  HPI This is a 72 yo male who presents today for follow up of chronic medical conditions. Accompanied by his wife.  He fell 4 weeks ago. He was leaning forward, felt light headed and fell onto left side. Some positional light headedness with standing quickly. Frequent falls, not regularly using cane or walker. Taking a break from PT until next week because of rib cage pain. Feels like he is getting up and going too fast which is causing falls. Wife has not witnessed falls. Taking ibuprofen 2-3 tablets a couple of times a day and using lidocaine patches with some improvement in pain.  Wife has noticed increased issues with memory, lethargy.  Has lost weight. Decreased appetite, but eating better in last week. Drinking 2 bottles of wine a week. Has been cutting back for last several days. Does not desire to go to counseling. Thinks he drinks out of habit.   Past Medical History:  Diagnosis Date  . Adenomatous polyp of colon 2006  . Anemia 2012  . Anxiety   . Arthritis    "left arm/shoulder" (05/17/2016)  . Atrial tachycardia, paroxysmal ([name]) 04/07/2015  . BPH (benign prostatic hypertrophy) 5/99  . Chronic bronchitis ([place])    "q year or q other year" (11/02/2011)  . Chronic nausea    "a few days/week" (05/17/2016)  . Colon polyp   . DCM (dilated cardiomyopathy) ([place])    EF 45-50% by echo and cath 2017  . Depression    Hx of  . Falls frequently    "daily lately; legs just give out" (11/02/2011; 05/17/2016)  . GERD (gastroesophageal reflux disease) 07/31/04   gastritis   . H/O hiatal hernia   . Hard of hearing   . Heart murmur   . Hernia, umbilical    "unrepaired" (11/02/2011; 05/17/2016)  . History of blood transfusion 2012  . HLD (hyperlipidemia) 5/99  . HTN (hypertension) 5/99  . Memory loss    "recently has been forgetting alot; maybe related to the alcohol" (11/02/2011; 05/17/2016)  . Mild  CAD    10% RCA  . PAC (premature atrial contraction) 04/07/2015  . Peripheral vascular disease ([place])   . Pneumonia 1996   hosp  . Pollen allergy    pollen/ragweed  . Seasonal allergies    Past Surgical History:  Procedure Laterality Date  . BACK SURGERY    . CARDIAC CATHETERIZATION N/A 05/23/2015   Procedure: Left Heart Cath and Coronary Angiography;  Surgeon: Troy Sine, MD;  Location: [facility];  Service: Cardiovascular;  Laterality: N/A;  . CATARACT EXTRACTION W/ INTRAOCULAR LENS  IMPLANT, BILATERAL Bilateral 10/2002  . COLONOSCOPY  07/31/04   multiple polyps; repeat in 3 years  . DUPUYTREN CONTRACTURE RELEASE Left   . ETT myoview  03/09/07   nml EF 66%  . FRACTURE SURGERY    . HEMORRHOID SURGERY     "cauterized a long time ago" (11/02/2011)  . hosp CP R/O'D 2/24  03/08/07  . neck MRI  6/04   C5-6 disc abnormality  . ORIF HUMERUS FRACTURE  11/04/2011   Procedure: OPEN REDUCTION INTERNAL FIXATION (ORIF) PROXIMAL HUMERUS FRACTURE;  Surgeon: Rozanna Box, MD;  Location: [facility];  Service: Orthopedics;  Laterality: Left;  . POSTERIOR CERVICAL FUSION/FORAMINOTOMY N/A 03/21/2012   Procedure: POSTERIOR CERVICAL FUSION/FORAMINOTOMY LEVEL ONE;  Surgeon: Charlie Pitter, MD;  Location: [facility]  [facility];  Service: Neurosurgery;  Laterality: N/A;  POSTERIOR CERVICAL ONE TO CERVICAL TWO FUSION WITH ILIAC CREST GRAFT AND LATERAL MASS SCREWS   . TONSILLECTOMY     "I was young" (11/02/2011)  . TOTAL HIP ARTHROPLASTY Left 10/24/2016   Procedure: TOTAL HIP ARTHROPLASTY ANTERIOR APPROACH;  Surgeon: Rod Can, MD;  Location: [facility];  Service: Orthopedics;  Laterality: Left;  . UPPER GASTROINTESTINAL ENDOSCOPY     Family History  Problem Relation Age of Onset  . Heart disease Father   . Diabetes Father   . Stroke Father   . Depression Mother   . Heart disease Brother   . Alcohol abuse Brother   . Liver disease Brother    Social History   Tobacco Use  . Smoking status: Former  Smoker    Types: Pipe  . Smokeless tobacco: Never Used  . Tobacco comment: 05/17/2016 "stopped in the late 1990s"  Substance Use Topics  . Alcohol use: Yes    Alcohol/week: 67.2 oz    Types: 112 Glasses of wine per week    Comment: 11/02/2011 "large box of wine q 2 days" 01-27-15 2 glass a day of wine; 05/17/2016 "1.75L bottles; 1- 1 1/2 bottles/day; somedays worse than others; at least 3-4 days/week he'll have 1 1/2 bottles; last drink was 05/16/2016" (05/17/2016)  . Drug use: No      Review of Systems Per HPI    Objective:   Physical Exam  Constitutional: He appears well-developed and well-nourished. No distress.  HENT:  Head: Normocephalic and atraumatic.  Eyes: Conjunctivae are normal.  Cardiovascular: Normal rate, regular rhythm and normal heart sounds.  Pulmonary/Chest: Effort normal and breath sounds normal.  Abdominal: A hernia (umbilical, reducible. ) is present.  Musculoskeletal:  Pain over left lateral ribcage. No palpable defect.   Neurological: He is alert.  Answers questions slowly, seems to be searching for words.   Skin: Skin is warm and dry. He is not diaphoretic.  Vitals reviewed.     BP (!) 158/88 (BP Location: Right Arm, Patient Position: Sitting, Cuff Size: Normal)   Pulse 86   Temp 98.2 F (36.8 C) (Oral)   Ht 5\' 9"  (1.753 m)   Wt 171 lb 12 oz (77.9 kg)   SpO2 98%   BMI 25.36 kg/m  Wt Readings from Last 3 Encounters:  06/29/17 171 lb 12 oz (77.9 kg)  05/09/17 188 lb 8 oz (85.5 kg)  04/07/17 180 lb (81.6 kg)       Assessment & Plan:  1. Iron deficiency anemia, unspecified iron deficiency anemia type - CBC with Differential  2. Alcoholic cirrhosis of liver without ascites (HCC) - Comprehensive metabolic panel  3. Alcohol abuse - Once again discussed alcohol use with patient and his wife, encouraged him to continue to decrease - CBC with Differential - Comprehensive metabolic panel  4. Intermittent lightheadedness - Reviewed meds, will  decrease seroquel to 150 mg, will hold trazadone, cyclobenzaprine. - he was instructed to use cane/walker at all times - CBC with Differential - Comprehensive metabolic panel  5. Essential hypertension - CBC with Differential - Comprehensive metabolic panel  6. Insomnia, unspecified type - discouraged use of OTC sleep aids as may be contributing to increased falls  7. Weight loss, unintentional - follow up in 4-6 weeks, encouraged supplemental drinks, increased calories, decreased ETOH  8. Frequent falls - adjustment in meds as above, instructed him to use can/walker at all times - he plans to resume PT next week which I  encouraged  9. Contusion of rib on left side, initial encounter - encouraged heat, continued use of lidocaine patches, small amounts ibuprofen  [name], FNP-BC  [facility] at [facility], [place]  06/29/2017 10:03 AM

## 2017-07-11 ENCOUNTER — Other Ambulatory Visit: Payer: Self-pay | Admitting: Family Medicine

## 2017-07-11 DIAGNOSIS — F1010 Alcohol abuse, uncomplicated: Secondary | ICD-10-CM

## 2017-07-11 DIAGNOSIS — M25552 Pain in left hip: Secondary | ICD-10-CM

## 2017-07-11 NOTE — Telephone Encounter (Signed)
Copied from [place] [id number]. Topic: Quick Communication - Rx Refill/Question >> Jul 11, 2017  1:33 PM Lysle Morales L, NT wrote: Medication: gabapentin (NEURONTIN) 100 MG capsule , patient would like this filled for a 90 supply instead of the 30 day supply   Has the patient contacted their pharmacy? yes  (Agent: If no, request that the patient contact the pharmacy for the refill. (Agent: If yes, when and what did the pharmacy advise?)  Preferred Pharmacy (with phone number or street name): [place], [place] [phone] (Phone) [phone] (Fax      Agent: Please be advised that RX refills may take up to 3 business days. We ask that you follow-up with your pharmacy.

## 2017-07-12 NOTE — Telephone Encounter (Signed)
Patient requesting a 90 day supply to mailorder  Gabapentin refill Last OV:03/30/17 Last refill:03/31/17 120 cap/1 refill NLW:[id number] Pharmacy: [place], [place] [phone] (Phone) [phone] (Fax)

## 2017-07-13 MED ORDER — GABAPENTIN 100 MG PO CAPS: 200.0000 mg | ORAL_CAPSULE | Freq: Two times a day (BID) | ORAL | 3 refills | Status: DC

## 2017-07-29 ENCOUNTER — Emergency Department (HOSPITAL_COMMUNITY)
Admission: EM | Admit: 2017-07-29 | Discharge: 2017-07-29 | Discharge status: Against Medical Advice | Payer: Medicare Other | Attending: Emergency Medicine | Admitting: Emergency Medicine

## 2017-07-29 ENCOUNTER — Emergency Department (HOSPITAL_COMMUNITY): Payer: Medicare Other

## 2017-07-29 DIAGNOSIS — Z96642 Presence of left artificial hip joint: Secondary | ICD-10-CM | POA: Insufficient documentation

## 2017-07-29 DIAGNOSIS — Z87891 Personal history of nicotine dependence: Secondary | ICD-10-CM | POA: Insufficient documentation

## 2017-07-29 DIAGNOSIS — R600 Localized edema: Secondary | ICD-10-CM | POA: Diagnosis not present

## 2017-07-29 DIAGNOSIS — I10 Essential (primary) hypertension: Secondary | ICD-10-CM | POA: Diagnosis not present

## 2017-07-29 DIAGNOSIS — E871 Hypo-osmolality and hyponatremia: Secondary | ICD-10-CM | POA: Diagnosis not present

## 2017-07-29 DIAGNOSIS — M6281 Muscle weakness (generalized): Secondary | ICD-10-CM | POA: Insufficient documentation

## 2017-07-29 DIAGNOSIS — I2510 Atherosclerotic heart disease of native coronary artery without angina pectoris: Secondary | ICD-10-CM | POA: Insufficient documentation

## 2017-07-29 DIAGNOSIS — M25532 Pain in left wrist: Secondary | ICD-10-CM | POA: Diagnosis not present

## 2017-07-29 DIAGNOSIS — R0789 Other chest pain: Secondary | ICD-10-CM

## 2017-07-29 DIAGNOSIS — R296 Repeated falls: Secondary | ICD-10-CM | POA: Diagnosis not present

## 2017-07-29 DIAGNOSIS — R079 Chest pain, unspecified: Secondary | ICD-10-CM | POA: Diagnosis not present

## 2017-07-29 DIAGNOSIS — R29898 Other symptoms and signs involving the musculoskeletal system: Secondary | ICD-10-CM

## 2017-07-29 DIAGNOSIS — R42 Dizziness and giddiness: Secondary | ICD-10-CM | POA: Diagnosis not present

## 2017-07-29 DIAGNOSIS — S0990XA Unspecified injury of head, initial encounter: Secondary | ICD-10-CM | POA: Diagnosis not present

## 2017-07-29 DIAGNOSIS — M79642 Pain in left hand: Secondary | ICD-10-CM | POA: Diagnosis not present

## 2017-07-29 DIAGNOSIS — R000 Tachycardia, unspecified: Secondary | ICD-10-CM | POA: Diagnosis not present

## 2017-07-29 LAB — BASIC METABOLIC PANEL
Anion gap: 12 (ref 5–15)
BUN: 8 mg/dL (ref 8–23)
CHLORIDE: 87 mmol/L — AB (ref 98–111)
CO2: 24 mmol/L (ref 22–32)
CREATININE: 0.9 mg/dL (ref 0.61–1.24)
Calcium: 8.7 mg/dL — ABNORMAL LOW (ref 8.9–10.3)
GFR calc Af Amer: >60 mL/min (ref >60)
GFR calc non Af Amer: >60 mL/min (ref >60)
GLUCOSE: 98 mg/dL (ref 70–99)
POTASSIUM: 4.6 mmol/L (ref 3.5–5.1)
Sodium: 123 mmol/L — ABNORMAL LOW (ref 135–145)

## 2017-07-29 LAB — CBC
HEMATOCRIT: 30.1 % — AB (ref 39.0–52.0)
Hemoglobin: 10.1 g/dL — ABNORMAL LOW (ref 13.0–17.0)
MCH: 29.5 pg (ref 26.0–34.0)
MCHC: 33.6 g/dL (ref 30.0–36.0)
MCV: 88 fL (ref 78.0–100.0)
PLATELETS: 181 10*3/uL (ref 150–400)
RBC: 3.42 MIL/uL — ABNORMAL LOW (ref 4.22–5.81)
RDW: 15 % (ref 11.5–15.5)
WBC: 6.2 10*3/uL (ref 4.0–10.5)

## 2017-07-29 LAB — BRAIN NATRIURETIC PEPTIDE: B NATRIURETIC PEPTIDE 5: 132.8 pg/mL — AB (ref 0.0–100.0)

## 2017-07-29 LAB — I-STAT TROPONIN, ED: Troponin i, poc: 0 ng/mL (ref 0.00–0.08)

## 2017-07-29 NOTE — ED Provider Notes (Signed)
MSE was initiated and I personally evaluated the patient and placed orders (if any) at  1:40 PM on July 29, 2017.  The patient appears stable so that the remainder of the MSE may be completed by another provider.  Patient placed in Quick Look pathway, seen and evaluated   Chief Complaint: chest pain  HPI:   Patient presents to ED for left-sided chest pain radiating to back, shortness of breath, cough since last night.  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  Apparently he has had 2 falls in the past 2 weeks.  He complains of left wrist pain.  Reports head injury as well.  Patient resides at home with son.  Denies hemoptysis, recent surgeries, abdominal pain, vomiting.  ROS: Chest pain  Physical Exam:   Gen: No distress  Neuro: Awake and Alert  Skin: Warm    Focused Exam: Lungs CTAB, RRR. Chest TTP on left side.   Initiation of care has begun. The patient has been counseled on the process, plan, and necessity for staying for the completion/evaluation, and the remainder of the medical screening examination   Delia Heady, PA-C 07/29/17 1342    Lajean Saver, MD 07/29/17 1454

## 2017-07-29 NOTE — ED Triage Notes (Signed)
Patient complains of left sided chest pain and shortness of breath since last night. Also complains of pain in left arm after a fall two weeks ago.

## 2017-07-29 NOTE — Discharge Instructions (Addendum)
You are leaving [place].  Your sodium level is low at 123 and it was recommended to come into the hospital for this.  He also have the left hand weakness and I am worried about a possible stroke or other neurologic problem.  Your chest pain could be coming from your heart and it was recommended you stay in the hospital to have this evaluated.  If you at any point change your mind or your symptoms worsen you may return at any moment.  Otherwise I want you to follow-up closely with your primary care physician.  Stop taking the Lexapro, seroquel and spironolactone as these can reduce your sodium level

## 2017-07-29 NOTE — ED Provider Notes (Signed)
[facility] EMERGENCY DEPARTMENT Provider Note   CSN: [id number] Arrival date & time: 07/29/17  1325     History   Chief Complaint Chief Complaint  Patient presents with  . Chest Pain    HPI [name] is a 72 y.o. male.  HPI  72 year old male presents for evaluation of chest pain and fall.  He states that he is been falling a couple times over the last 2 weeks.  Has had his head and injured his left wrist.  He is currently having a soft wrist brace on because he states that his hand feels weak.  His hand has been swollen.  He did hit his head.  He states he just felt weak and fell over.  He denies any specific focal weakness otherwise.  He is also been having on and off chest pain that he describes as sharp in his left chest near the shoulder on and off for this past week.  Often happens when he is doing something but not every time.  He denies shortness of breath.  He states he was recently put on Lexapro.  Past Medical History:  Diagnosis Date  . Adenomatous polyp of colon 2006  . Anemia 2012  . Anxiety   . Arthritis    "left arm/shoulder" (05/17/2016)  . Atrial tachycardia, paroxysmal ([place]) 04/07/2015  . BPH (benign prostatic hypertrophy) 5/99  . Chronic bronchitis ([place])    "q year or q other year" (11/02/2011)  . Chronic nausea    "a few days/week" (05/17/2016)  . Colon polyp   . DCM (dilated cardiomyopathy) ([place])    EF 45-50% by echo and cath 2017  . Depression    Hx of  . Falls frequently    "daily lately; legs just give out" (11/02/2011; 05/17/2016)  . GERD (gastroesophageal reflux disease) 07/31/04   gastritis   . H/O hiatal hernia   . Hard of hearing   . Heart murmur   . Hernia, umbilical    "unrepaired" (11/02/2011; 05/17/2016)  . History of blood transfusion 2012  . HLD (hyperlipidemia) 5/99  . HTN (hypertension) 5/99  . Memory loss    "recently has been forgetting alot; maybe related to the alcohol" (11/02/2011; 05/17/2016)  . Mild CAD    10% RCA  . PAC (premature atrial contraction) 04/07/2015  . Peripheral vascular disease ([place])   . Pneumonia 1996   hosp  . Pollen allergy    pollen/ragweed  . Seasonal allergies     Patient Active Problem List   Diagnosis Date Noted  . Frequent falls 06/29/2017  . Fall 04/09/2017  . Scalp laceration   . Hypomagnesemia 10/25/2016  . Hip fracture ([place]) 10/24/2016  . Displaced fracture of left femoral neck ([place]) 10/24/2016  . Thrombocytopenia ([place]) 10/24/2016  . Closed displaced fracture of left femoral neck ([place]) 10/23/2016  . Contracture of muscle of left hand 09/15/2016  . Infected laceration 07/08/2016  . Generalized muscle weakness 07/08/2016  . Anemia 05/18/2016  . Syncope and collapse   . Syncope 05/17/2016  . Cellulitis 05/17/2016  . Rib fractures 05/17/2016  . Bullous pemphigoid 05/17/2016  . ETOH abuse 03/17/2016  . Dizziness and giddiness 12/09/2015  . Chronic pain syndrome 12/09/2015  . Arthritis 11/20/2015  . Mild CAD   . DCM (dilated cardiomyopathy) ([place])   . Abnormal nuclear stress test 05/07/2015  . PAC (premature atrial contraction) 04/07/2015  . Atrial tachycardia, paroxysmal ([place]) 03/10/2015  . Depression 04/12/2012  . C2 cervical fracture ([place])  02/29/2012  . Alcohol withdrawal ([place]) 11/06/2011  . Fracture of humerus, proximal, left, closed 11/02/2011  . Hyponatremia 11/17/2010  . H/O: upper GI bleed 10/20/2010  . Stomach ulcer from aspirin/ibuprofen-like drugs (NSAID's) 10/20/2010  . Colon polyp 08/26/2010  . Diverticulosis of colon (without mention of hemorrhage) 08/26/2010  . BPH (benign prostatic hyperplasia) 07/02/2010  . Deficiency anemia 03/09/2010  . Hyposmolality and/or hyponatremia 10/17/2009  . ALCOHOLIC CIRRHOSIS OF LIVER 07/25/2009  . Hypokalemia 04/03/2007  . Anxiety state 01/24/2007  . HLD (hyperlipidemia) 07/28/2006  . Alcohol abuse 07/28/2006  . Essential hypertension 07/28/2006  . IMPOTENCE, ORGANIC ORIGIN 07/28/2006    Past  Surgical History:  Procedure Laterality Date  . BACK SURGERY    . CARDIAC CATHETERIZATION N/A 05/23/2015   Procedure: Left Heart Cath and Coronary Angiography;  Surgeon: Troy Sine, MD;  Location: [facility];  Service: Cardiovascular;  Laterality: N/A;  . CATARACT EXTRACTION W/ INTRAOCULAR LENS  IMPLANT, BILATERAL Bilateral 10/2002  . COLONOSCOPY  07/31/04   multiple polyps; repeat in 3 years  . DUPUYTREN CONTRACTURE RELEASE Left   . ETT myoview  03/09/07   nml EF 66%  . FRACTURE SURGERY    . HEMORRHOID SURGERY     "cauterized a long time ago" (11/02/2011)  . hosp CP R/O'D 2/24  03/08/07  . neck MRI  6/04   C5-6 disc abnormality  . ORIF HUMERUS FRACTURE  11/04/2011   Procedure: OPEN REDUCTION INTERNAL FIXATION (ORIF) PROXIMAL HUMERUS FRACTURE;  Surgeon: Rozanna Box, MD;  Location: [facility];  Service: Orthopedics;  Laterality: Left;  . POSTERIOR CERVICAL FUSION/FORAMINOTOMY N/A 03/21/2012   Procedure: POSTERIOR CERVICAL FUSION/FORAMINOTOMY LEVEL ONE;  Surgeon: Charlie Pitter, MD;  Location: [facility];  Service: Neurosurgery;  Laterality: N/A;  POSTERIOR CERVICAL ONE TO CERVICAL TWO FUSION WITH ILIAC CREST GRAFT AND LATERAL MASS SCREWS   . TONSILLECTOMY     "I was young" (11/02/2011)  . TOTAL HIP ARTHROPLASTY Left 10/24/2016   Procedure: TOTAL HIP ARTHROPLASTY ANTERIOR APPROACH;  Surgeon: Rod Can, MD;  Location: [facility];  Service: Orthopedics;  Laterality: Left;  . UPPER GASTROINTESTINAL ENDOSCOPY          Home Medications    Prior to Admission medications   Medication Sig Start Date End Date Taking? Authorizing Provider  cetirizine (ZYRTEC) 10 MG tablet Take 1 tablet (10 mg total) daily by mouth. 11/30/16   Lucille Passy, MD  Cyanocobalamin (VITAMIN B-12 PO) Take 1 tablet by mouth daily.    [provider]  cyclobenzaprine (FLEXERIL) 10 MG tablet Take 1 tablet (10 mg total) by mouth daily as needed for muscle spasms. 02/18/17   Elby Beck, FNP    ferrous sulfate 325 (65 FE) MG tablet Take 325 mg by mouth daily with breakfast.    [provider]  fluticasone (FLONASE) 50 MCG/ACT nasal spray Place 1 spray into both nostrils 2 (two) times daily as needed for allergies. 06/22/17   Elby Beck, FNP  folic acid (FOLVITE) 1 MG tablet Take 1 tablet (1 mg total) by mouth daily. 10/31/16   Isaac Bliss, Rayford Halsted, MD  gabapentin (NEURONTIN) 100 MG capsule Take 2 capsules (200 mg total) by mouth 2 (two) times daily. 07/13/17   Elby Beck, FNP  ibuprofen (ADVIL,MOTRIN) 400 MG tablet Take 1 tablet (400 mg total) by mouth every 6 (six) hours as needed for moderate pain (neck pain). 04/11/17   Shelly Coss, MD  KLOR-CON M15 15 MEQ tablet  07/08/17   [provider]  lidocaine (LIDODERM) 5 % Place 1 patch onto the skin daily. Remove & Discard patch within 12 hours or as directed by MD Patient taking differently: Place 1 patch onto the skin daily as needed (pain). Remove & Discard patch within 12 hours or as directed by MD 05/11/16   Lucille Passy, MD  magnesium oxide (MAG-OX) 400 (241.3 Mg) MG tablet Take 1 tablet (400 mg total) by mouth daily. 04/12/17   Shelly Coss, MD  metoprolol succinate (TOPROL-XL) 25 MG 24 hr tablet Take 1 tablet (25 mg total) by mouth daily. 06/21/17   Elby Beck, FNP  Multiple Vitamin (MULTIVITAMIN WITH MINERALS) TABS tablet Take 1 tablet by mouth daily. 10/31/16   Isaac Bliss, Rayford Halsted, MD  pantoprazole (PROTONIX) 40 MG tablet TAKE 1 TABLET DAILY 06/14/17   Viviana Simpler I, MD  potassium chloride SA (K-DUR,KLOR-CON) 20 MEQ tablet Take 1 tablet (20 mEq total) by mouth daily. 04/11/17 05/11/17  Shelly Coss, MD  promethazine (PHENERGAN) 25 MG tablet TAKE 1 TABLET DAILY AS NEEDED FOR NAUSEA 06/21/17   Elby Beck, FNP  Thiamine HCl (VITAMIN B-1) 100 MG tablet Take 100 mg by mouth daily. Reported on 02/17/2015    [provider]  traZODone (DESYREL) 50 MG tablet Take 1 tablet  (50 mg total) by mouth at bedtime. 06/22/17   Elby Beck, FNP    Family History Family History  Problem Relation Age of Onset  . Heart disease Father   . Diabetes Father   . Stroke Father   . Depression Mother   . Heart disease Brother   . Alcohol abuse Brother   . Liver disease Brother     Social History Social History   Tobacco Use  . Smoking status: Former Smoker    Types: Pipe  . Smokeless tobacco: Never Used  . Tobacco comment: 05/17/2016 "stopped in the late 1990s"  Substance Use Topics  . Alcohol use: Yes    Alcohol/week: 1.2 - 1.8 oz    Types: 2 - 3 Glasses of wine per week    Comment: 11/02/2011 "large box of wine q 2 days" 01-27-15 2 glass a day of wine; 05/17/2016 "1.75L bottles; 1- 1 1/2 bottles/day; somedays worse than others; at least 3-4 days/week he'll have 1 1/2 bottles; last drink was 05/16/2016" . 2-3 glasses wine daily (06/29/17)  . Drug use: No     Allergies   Nifedipine   Review of Systems Review of Systems  Respiratory: Negative for shortness of breath.   Cardiovascular: Positive for chest pain.  Gastrointestinal: Negative for abdominal pain and vomiting.  Musculoskeletal: Positive for arthralgias and joint swelling. Negative for back pain.  Neurological: Positive for weakness.  All other systems reviewed and are negative.    Physical Exam Updated Vital Signs BP (!) 161/94   Pulse 97   Temp 98.6 F (37 C) (Oral)   Resp 17   SpO2 99%   Physical Exam  Constitutional: He is oriented to person, place, and time. He appears well-developed and well-nourished.  HENT:  Head: Normocephalic and atraumatic.  Right Ear: External ear normal.  Left Ear: External ear normal.  Nose: Nose normal.  Eyes: Right eye exhibits no discharge. Left eye exhibits no discharge.  Neck: Neck supple.  Cardiovascular: Regular rhythm, normal heart sounds and intact distal pulses. Tachycardia present.  HR~100  Pulmonary/Chest: Effort normal and breath sounds  normal.  Abdominal: Soft. There is no tenderness.  Musculoskeletal:  Left wrist: He exhibits normal range of motion and no tenderness.       Left hand: He exhibits swelling. Normal sensation noted. Decreased strength noted.       Right lower leg: He exhibits edema.       Left lower leg: He exhibits edema (BLE, mild, pitting 1+).  Neurological: He is alert and oriented to person, place, and time. He displays tremor.  CN 3-12 grossly intact. 5/5 strength in all 4 extremities except in left hand. Grossly normal sensation. Normal finger to nose.   Skin: Skin is warm and dry.  Nursing note and vitals reviewed.    ED Treatments / Results  Labs (all labs ordered are listed, but only abnormal results are displayed) Labs Reviewed  BASIC METABOLIC PANEL - Abnormal; Notable for the following components:      Result Value   Sodium 123 (*)    Chloride 87 (*)    Calcium 8.7 (*)    All other components within normal limits  CBC - Abnormal; Notable for the following components:   RBC 3.42 (*)    Hemoglobin 10.1 (*)    HCT 30.1 (*)    All other components within normal limits  BRAIN NATRIURETIC PEPTIDE - Abnormal; Notable for the following components:   B Natriuretic Peptide 132.8 (*)    All other components within normal limits  I-STAT TROPONIN, ED    EKG EKG Interpretation  Date/Time:  Friday July 29 2017 13:35:45 EDT Ventricular Rate:  87 PR Interval:  206 QRS Duration: 98 QT Interval:  344 QTC Calculation: 413 R Axis:   56 Text Interpretation:  Normal sinus rhythm Anteroseptal infarct , age undetermined Abnormal ECG no significant change since Mar 2019 Confirmed by Sherwood Gambler [phone]) on 07/29/2017 4:21:28 PM   Radiology Dg Chest 2 View  Result Date: 07/29/2017 CLINICAL DATA:  Chest pain EXAM: CHEST - 2 VIEW COMPARISON:  10/27/2016 FINDINGS: The lungs are well inflated. There is mild cardiomegaly. Bibasilar atelectasis. No pulmonary edema. There is no pleural effusion or  pneumothorax. IMPRESSION: Mild cardiomegaly and bibasilar atelectasis. Electronically Signed   By: Ulyses Jarred M.D.   On: 07/29/2017 14:23   Dg Wrist Complete Left  Result Date: 07/29/2017 CLINICAL DATA:  Left hand and wrist pain following multiple falls 2 weeks ago. EXAM: LEFT WRIST - COMPLETE 3+ VIEW COMPARISON:  Left hand radiographs obtained at the same time. Left forearm radiographs dated 11/02/2011. FINDINGS: First metacarpal/carpal degenerative changes. No fracture or dislocation. Diffuse arterial calcifications. IMPRESSION: 1. No fracture. 2. First metacarpal/carpal degenerative changes. 3. Extensive atheromatous arterial calcifications. Electronically Signed   By: Claudie Revering M.D.   On: 07/29/2017 14:27   Ct Head Wo Contrast  Result Date: 07/29/2017 CLINICAL DATA:  Dizziness.  Recent fall EXAM: CT HEAD WITHOUT CONTRAST TECHNIQUE: Contiguous axial images were obtained from the base of the skull through the vertex without intravenous contrast. COMPARISON:  April 07, 2017 FINDINGS: Brain: Mild diffuse atrophy is stable. There is no intracranial mass, hemorrhage, extra-axial fluid collection, or midline shift. There is patchy small vessel disease in the centra semiovale bilaterally. No new gray-white compartment lesions are evident. There is no evident acute infarct. Vascular: No hyperdense vessel is evident. There is calcification in the distal left vertebral artery and in the carotid siphon regions bilaterally. Skull: Bony calvarium a appears intact. Postoperative changes incompletely visualized in the posterior upper cervical region. Sinuses/Orbits: There is opacification of multiple ethmoid air cells throughout the left ethmoid air cell complex.  There is mucosal thickening in the left frontal sinus. Orbits appear symmetric bilaterally. Other: There is chronic opacification in several inferior mastoid air cells on the left. Mastoids elsewhere clear. IMPRESSION: Mild atrophy with patchy  periventricular small vessel disease. No acute infarct evident. No mass or hemorrhage. Foci of arterial vascular calcification noted. Incomplete visualization of postoperative changes in the upper cervical spine posteriorly. Paranasal sinus disease, primarily in the left ethmoid and left frontal sinus regions. Chronic inferior left mastoid air cell disease without new mastoid air cell disease evident. Electronically Signed   By: Lowella Grip III M.D.   On: 07/29/2017 14:56   Dg Hand Complete Left  Result Date: 07/29/2017 CLINICAL DATA:  Left hand and wrist pain following multiple falls 2 weeks ago. EXAM: LEFT HAND - COMPLETE 3+ VIEW COMPARISON:  Left forearm dated 11/02/2011. FINDINGS: Degenerative spur formation involving the 1st IP joint, 5th PIP joint and 5th DIP joint. Dura changes at the articulation of the 1st metacarpal and trapezium. No fracture or dislocation seen. Diffuse arterial calcifications. IMPRESSION: 1. No fracture. 2. Changes of osteoarthritis as described above. 3. Extensive atheromatous arterial calcifications. Electronically Signed   By: Claudie Revering M.D.   On: 07/29/2017 14:26    Procedures Procedures (including critical care time)  Medications Ordered in ED Medications - No data to display   Initial Impression / Assessment and Plan / ED Course  I have reviewed the triage vital signs and the nursing notes.  Pertinent labs & imaging results that were available during my care of the patient were reviewed by me and considered in my medical decision making (see chart for details).     Patient has multiple abnormalities on exam and work-up.  His sodium is 123.  This is concerning as this could be leading to his falls.  He also chronically still drinks alcohol.  However on my exam he is awake, alert, and capable of making medical decisions by himself.  His ECG and troponin do not reveal any obvious ACS or pulmonary pathology.  He does have some mildly elevated BNP with some  mild swelling in his legs that has been going on for about a week.  He also has some left hand weakness focally but it does not seem pain related and thus I recommended an MRI and stroke work-up.  With all of the stuff I recommended admission.  He initially agrees but then only a few minutes later states he cannot stay and does not want to stay.  He acknowledges that multiple abnormalities including hyponatremia, possible stroke, and possible cardiac disease could result in death or disability.  He understands this and appears capable of making these decisions to leave AMA.  He is able to ambulate with a cane which is his typical.  He declines further work-up in the ED including MRI.  I have stressed that he can return at any time and is encouraged to but otherwise should follow-up closely with his PCP.  Final Clinical Impressions(s) / ED Diagnoses   Final diagnoses:  Hyponatremia  Left hand weakness  Atypical chest pain    ED Discharge Orders    None       Sherwood Gambler, MD 07/29/17 [phone]

## 2017-07-29 NOTE — ED Notes (Addendum)
Results reviewed.  No hx of CHF with elevated BNP.  Will upgrade acuity and prioritize rooming

## 2017-08-05 ENCOUNTER — Ambulatory Visit (INDEPENDENT_AMBULATORY_CARE_PROVIDER_SITE_OTHER)
Admission: RE | Admit: 2017-08-05 | Discharge: 2017-08-05 | Disposition: A | Discharge status: Home / Self Care | Payer: Medicare Other | Source: Ambulatory Visit | Attending: Family Medicine | Admitting: Family Medicine

## 2017-08-05 ENCOUNTER — Telehealth: Payer: Self-pay

## 2017-08-05 ENCOUNTER — Ambulatory Visit (INDEPENDENT_AMBULATORY_CARE_PROVIDER_SITE_OTHER): Payer: Medicare Other | Admitting: Family Medicine

## 2017-08-05 ENCOUNTER — Telehealth: Payer: Self-pay | Admitting: Family Medicine

## 2017-08-05 VITALS — BP 126/70 | HR 99 | Temp 97.6°F | Ht 69.0 in | Wt 197.0 lb

## 2017-08-05 DIAGNOSIS — M79632 Pain in left forearm: Secondary | ICD-10-CM | POA: Diagnosis not present

## 2017-08-05 DIAGNOSIS — W19XXXA Unspecified fall, initial encounter: Secondary | ICD-10-CM

## 2017-08-05 DIAGNOSIS — M25512 Pain in left shoulder: Secondary | ICD-10-CM | POA: Diagnosis not present

## 2017-08-05 DIAGNOSIS — M79602 Pain in left arm: Secondary | ICD-10-CM

## 2017-08-05 DIAGNOSIS — F1010 Alcohol abuse, uncomplicated: Secondary | ICD-10-CM

## 2017-08-05 DIAGNOSIS — E876 Hypokalemia: Secondary | ICD-10-CM | POA: Diagnosis not present

## 2017-08-05 DIAGNOSIS — S59912A Unspecified injury of left forearm, initial encounter: Secondary | ICD-10-CM | POA: Diagnosis not present

## 2017-08-05 DIAGNOSIS — M79642 Pain in left hand: Secondary | ICD-10-CM | POA: Diagnosis not present

## 2017-08-05 DIAGNOSIS — M7989 Other specified soft tissue disorders: Secondary | ICD-10-CM

## 2017-08-05 DIAGNOSIS — R296 Repeated falls: Secondary | ICD-10-CM | POA: Diagnosis not present

## 2017-08-05 DIAGNOSIS — S6992XA Unspecified injury of left wrist, hand and finger(s), initial encounter: Secondary | ICD-10-CM | POA: Diagnosis not present

## 2017-08-05 DIAGNOSIS — E871 Hypo-osmolality and hyponatremia: Secondary | ICD-10-CM | POA: Diagnosis not present

## 2017-08-05 DIAGNOSIS — S4992XA Unspecified injury of left shoulder and upper arm, initial encounter: Secondary | ICD-10-CM | POA: Diagnosis not present

## 2017-08-05 DIAGNOSIS — I10 Essential (primary) hypertension: Secondary | ICD-10-CM | POA: Diagnosis not present

## 2017-08-05 LAB — CBC WITH DIFFERENTIAL/PLATELET
Basophils Absolute: 0.1 10*3/uL (ref 0.0–0.1)
Basophils Relative: 1.1 % (ref 0.0–3.0)
EOS ABS: 0.5 10*3/uL (ref 0.0–0.7)
Eosinophils Relative: 6.5 % — ABNORMAL HIGH (ref 0.0–5.0)
HEMATOCRIT: 27.7 % — AB (ref 39.0–52.0)
Hemoglobin: 9.5 g/dL — ABNORMAL LOW (ref 13.0–17.0)
LYMPHS ABS: 1.5 10*3/uL (ref 0.7–4.0)
Lymphocytes Relative: 20.9 % (ref 12.0–46.0)
MCHC: 34.2 g/dL (ref 30.0–36.0)
MCV: 90.8 fl (ref 78.0–100.0)
MONO ABS: 0.9 10*3/uL (ref 0.1–1.0)
Monocytes Relative: 12.7 % — ABNORMAL HIGH (ref 3.0–12.0)
NEUTROS ABS: 4.2 10*3/uL (ref 1.4–7.7)
Neutrophils Relative %: 58.8 % (ref 43.0–77.0)
PLATELETS: 164 10*3/uL (ref 150.0–400.0)
RBC: 3.05 Mil/uL — ABNORMAL LOW (ref 4.22–5.81)
RDW: 17.2 % — AB (ref 11.5–15.5)
WBC: 7.2 10*3/uL (ref 4.0–10.5)

## 2017-08-05 LAB — COMPREHENSIVE METABOLIC PANEL
ALT: 13 U/L (ref 0–53)
AST: 23 U/L (ref 0–37)
Albumin: 3.9 g/dL (ref 3.5–5.2)
Alkaline Phosphatase: 104 U/L (ref 39–117)
BUN: 17 mg/dL (ref 6–23)
CALCIUM: 8.5 mg/dL (ref 8.4–10.5)
CHLORIDE: 95 meq/L — AB (ref 96–112)
CO2: 33 meq/L — AB (ref 19–32)
CREATININE: 1.49 mg/dL (ref 0.40–1.50)
GFR: 49.42 mL/min — ABNORMAL LOW (ref >60.00)
Glucose, Bld: 103 mg/dL — ABNORMAL HIGH (ref 70–99)
Potassium: 3.9 mEq/L (ref 3.5–5.1)
SODIUM: 138 meq/L (ref 135–145)
Total Bilirubin: 0.3 mg/dL (ref 0.2–1.2)
Total Protein: 6.9 g/dL (ref 6.0–8.3)

## 2017-08-05 MED ORDER — TRAMADOL HCL 50 MG PO TABS: 50.0000 mg | ORAL_TABLET | Freq: Three times a day (TID) | ORAL | 0 refills | Status: DC | PRN

## 2017-08-05 NOTE — Progress Notes (Signed)
Subjective:    Patient ID: [name], male    DOB: [birth year], 72 y.o.   MRN: [id number]  HPI This is a 72 yo male, brought in by his brother in law. He presents for scheduled follow up but also had recent falls, fell onto left hip about two weeks ago and fell 7 days ago, pinning himself between his recliner and the wall, hurting his left arm. Very painful. Increased swelling and extensive bruising. Has been taking ibuprofen, Alleve. Taking 3 (?) at a time. No relief of pain. Pain up to shoulder. Didn't come in sooner because he had this appointment today.   Spoke with patient's wife via telephone who reports that the patient has not been drinking this week, but drank heavily last week while she was out of town. Prior, had gone several weeks without drinking. Wife reports that while she was gone, he got medications from his mail order pharmacy and was taking his medications incorrectly. Son also in the home, but "can't handle him."   Increased swelling of legs. According to ED note 07/29/17, spironolactone was stopped. He was advised to stay but left AMA.   Requesting refill of quentapine, ? Dose, not on med list. According to patient's wife, he takes 1/2 tablet at bedtime along with trazadone, gabapentin and sometimes OTC sleep aid.   Past Medical History:  Diagnosis Date  . Adenomatous polyp of colon 2006  . Anemia 2012  . Anxiety   . Arthritis    "left arm/shoulder" (05/17/2016)  . Atrial tachycardia, paroxysmal ([place]) 04/07/2015  . BPH (benign prostatic hypertrophy) 5/99  . Chronic bronchitis ([place])    "q year or q other year" (11/02/2011)  . Chronic nausea    "a few days/week" (05/17/2016)  . Colon polyp   . DCM (dilated cardiomyopathy) ([place])    EF 45-50% by echo and cath 2017  . Depression    Hx of  . Falls frequently    "daily lately; legs just give out" (11/02/2011; 05/17/2016)  . GERD (gastroesophageal reflux disease) 07/31/04   gastritis   . H/O hiatal hernia   . Hard of  hearing   . Heart murmur   . Hernia, umbilical    "unrepaired" (11/02/2011; 05/17/2016)  . History of blood transfusion 2012  . HLD (hyperlipidemia) 5/99  . HTN (hypertension) 5/99  . Memory loss    "recently has been forgetting alot; maybe related to the alcohol" (11/02/2011; 05/17/2016)  . Mild CAD    10% RCA  . PAC (premature atrial contraction) 04/07/2015  . Peripheral vascular disease ([place])   . Pneumonia 1996   hosp  . Pollen allergy    pollen/ragweed  . Seasonal allergies    Past Surgical History:  Procedure Laterality Date  . BACK SURGERY    . CARDIAC CATHETERIZATION N/A 05/23/2015   Procedure: Left Heart Cath and Coronary Angiography;  Surgeon: Troy Sine, MD;  Location: [facility];  Service: Cardiovascular;  Laterality: N/A;  . CATARACT EXTRACTION W/ INTRAOCULAR LENS  IMPLANT, BILATERAL Bilateral 10/2002  . COLONOSCOPY  07/31/04   multiple polyps; repeat in 3 years  . DUPUYTREN CONTRACTURE RELEASE Left   . ETT myoview  03/09/07   nml EF 66%  . FRACTURE SURGERY    . HEMORRHOID SURGERY     "cauterized a long time ago" (11/02/2011)  . hosp CP R/O'D 2/24  03/08/07  . neck MRI  6/04   C5-6 disc abnormality  . ORIF HUMERUS FRACTURE  11/04/2011  Procedure: OPEN REDUCTION INTERNAL FIXATION (ORIF) PROXIMAL HUMERUS FRACTURE;  Surgeon: Rozanna Box, MD;  Location: [facility];  Service: Orthopedics;  Laterality: Left;  . POSTERIOR CERVICAL FUSION/FORAMINOTOMY N/A 03/21/2012   Procedure: POSTERIOR CERVICAL FUSION/FORAMINOTOMY LEVEL ONE;  Surgeon: Charlie Pitter, MD;  Location: [facility];  Service: Neurosurgery;  Laterality: N/A;  POSTERIOR CERVICAL ONE TO CERVICAL TWO FUSION WITH ILIAC CREST GRAFT AND LATERAL MASS SCREWS   . TONSILLECTOMY     "I was young" (11/02/2011)  . TOTAL HIP ARTHROPLASTY Left 10/24/2016   Procedure: TOTAL HIP ARTHROPLASTY ANTERIOR APPROACH;  Surgeon: Rod Can, MD;  Location: [facility];  Service: Orthopedics;  Laterality: Left;  . UPPER  GASTROINTESTINAL ENDOSCOPY     Family History  Problem Relation Age of Onset  . Heart disease Father   . Diabetes Father   . Stroke Father   . Depression Mother   . Heart disease Brother   . Alcohol abuse Brother   . Liver disease Brother    Social History   Tobacco Use  . Smoking status: Former Smoker    Types: Pipe  . Smokeless tobacco: Never Used  . Tobacco comment: 05/17/2016 "stopped in the late 1990s"  Substance Use Topics  . Alcohol use: Yes    Alcohol/week: 1.2 - 1.8 oz    Types: 2 - 3 Glasses of wine per week    Comment: 11/02/2011 "large box of wine q 2 days" 01-27-15 2 glass a day of wine; 05/17/2016 "1.75L bottles; 1- 1 1/2 bottles/day; somedays worse than others; at least 3-4 days/week he'll have 1 1/2 bottles; last drink was 05/16/2016" . 2-3 glasses wine daily (06/29/17)  . Drug use: No     Review of Systems  Constitutional: Positive for unexpected weight change (increased).  Respiratory: Positive for shortness of breath (with exertion, no change).   Cardiovascular: Positive for leg swelling. Negative for chest pain.  Gastrointestinal: Negative.   Musculoskeletal:       Left hip pain, left arm pain.   Skin:       Bruising on left hip and left arm  Neurological: Positive for light-headedness (intermittent).  Psychiatric/Behavioral: Positive for agitation. The patient is nervous/anxious.        Objective:   Physical Exam  Constitutional: He appears well-developed and well-nourished. No distress.  HENT:  Head: Normocephalic.  Several areas of scabbing on scalp. Head shaved.   Eyes: Conjunctivae are normal.  Cardiovascular: Normal rate, regular rhythm and normal heart sounds.  Pulmonary/Chest: Effort normal and breath sounds normal.  Musculoskeletal: He exhibits edema (1+ LE edema).  Steady gait using cane.  Left hip with resolving ecchymosis  Left shoulder with decreased ROM, generalized tenderness Left elbow with normal ROM, non tender Left forearm and  hand with edema and ecchymosis extending into fingers. Wrist, fingers with decreased ROM. Swelling of distal forearm, healing abrasion. No defects palpated but unable to fully assess due to swelling and discomfort.  ROM of fingers limited by edema.   Neurological: He is alert.  Answers questions appropriately.   Skin: He is not diaphoretic.  Vitals reviewed.     BP 126/70 (BP Location: Right Arm, Patient Position: Sitting, Cuff Size: Normal)   Pulse 99   Temp 97.6 F (36.4 C) (Oral)   Ht 5\' 9"  (1.753 m)   Wt 197 lb (89.4 kg)   SpO2 96%   BMI 29.09 kg/m  Wt Readings from Last 3 Encounters:  08/05/17 197 lb (89.4 kg)  06/29/17 171 lb  12 oz (77.9 kg)  05/09/17 188 lb 8 oz (85.5 kg)   Dg Forearm Left  Result Date: 08/05/2017 CLINICAL DATA:  72 year old male status post fall with pain. EXAM: LEFT FOREARM - 2 VIEW COMPARISON:  Left forearm series 07/17/2012. FINDINGS: Calcified peripheral vascular disease is increased in conspicuity since 2014. Bone mineralization is within normal limits for age. There is new cortical irregularity since 2014 about the proximal radius metadiaphysis, but associated regional sclerosis appears nonacute. The distal left humerus and proximal ulna appear intact. Elsewhere the left radius and ulna appear intact. Alignment appears maintained at the left elbow and shoulder. IMPRESSION: 1. Age indeterminate irregularity at the neck of the left radius, new since 2014. If there is elbow pain recommend dedicated left elbow series. 2. No other acute osseous abnormality identified in the left forearm. 3. Calcified peripheral vascular disease. Electronically Signed   By: Genevie Ann M.D.   On: 08/05/2017 08:46   Dg Shoulder Left  Result Date: 08/05/2017 CLINICAL DATA:  72 year old male status post fall with left shoulder pain. EXAM: LEFT SHOULDER - 2+ VIEW COMPARISON:  Left shoulder series 02/20/2016 and earlier. FINDINGS: Chronic proximal left humerus ORIF and healed fracture.  Hardware is stable since 2015, including a single pin or screw which extends superficial to the plate by 3-4 millimeters. No glenohumeral joint dislocation. There is chronic severe glenohumeral joint space loss with subchondral sclerosis and inferior osteophytosis. Chronic resorption of the distal left clavicle is stable since 2018 but has progressed since 2015. There are numerous chronic left lateral rib fractures. No acute osseous abnormality identified. Stable visible left chest. IMPRESSION: 1.  No acute osseous abnormality identified about the left shoulder. 2. Chronic proximal left humerus ORIF and severe left glenohumeral joint degeneration. 3. Chronic changes to the distal left clavicle, chronic left rib fractures. Electronically Signed   By: Genevie Ann M.D.   On: 08/05/2017 08:44   Dg Hand Complete Left  Result Date: 08/05/2017 CLINICAL DATA:  72 year old male status post fall with pain. EXAM: LEFT HAND - COMPLETE 3+ VIEW COMPARISON:  Left forearm series today reported separately. FINDINGS: Calcified peripheral vascular disease. The distal left radius and ulna appear intact. Radiocarpal joint space loss and subchondral sclerosis. Preserved carpal bone alignment and joint spaces. First CMC moderate joint space loss with subchondral sclerosis and osteophytosis. The metacarpals appear intact. The phalanges appear intact. Widespread mild to moderate IP joint space loss. There is prominent soft tissue swelling and increased density along the dorsal left hand and distal wrist corresponding to the proximal metacarpal region (image 2). IMPRESSION: 1. Soft tissue swelling with no acute fracture or dislocation identified. 2. Calcified peripheral vascular disease. 3. Osteoarthritis in the left wrist and hand. Electronically Signed   By: Genevie Ann M.D.   On: 08/05/2017 08:49       Assessment & Plan:  1. Alcohol abuse - discussed alcohol use with patient, encouraged abstinence. Offered referral for treatment, he  declined - reviewed risks to health if he continues drinking alcohol  2. Frequent falls - concerned about patient safety in the home and will have home health evaluate his environment and living situation - encouraged him to use walker at all times  3. Hypokalemia - CBC with Differential - Comprehensive metabolic panel - Ambulatory referral to Home Health  4. Essential hypertension - CBC with Differential - Comprehensive metabolic panel  5. Hyponatremia - Comprehensive metabolic panel - Ambulatory referral to [facility]  6. Fall, initial encounter - stat xrays  obtained, no worrisome findings, left arm wrapped in ace wrap for comfort, he was advised to elevate, use heat/ice prn - DG Shoulder Left; Future - DG Forearm Left; Future - DG Hand Complete Left; Future - Ambulatory referral to [facility] - Expressed concern that patient was not brought in sooner given pain, swelling, discoloration of left arm  7. Left arm pain - traMADol (ULTRAM) 50 MG tablet; Take 1 tablet (50 mg total) by mouth every 8 (eight) hours as needed.  Dispense: 15 tablet; Refill: 0 - Ambulatory referral to [facility]  - Had discussion with patient and family about my concerns for his safety with his frequent falls, alcohol abuse, mixing OTC sleep aids with his prescription medications and possibly taking his medications incorrectly. Will have home health evaluate. Wife will administer his medications to avoid errors.   Over 45 minutes were spent face-to-face with the patient during this encounter and >50% of that time was spent on counseling and coordination of care  [name], FNP-BC  [facility], [place]  08/06/2017 8:01 AM

## 2017-08-05 NOTE — Telephone Encounter (Signed)
Wife informed of results. See result note.

## 2017-08-05 NOTE — Telephone Encounter (Signed)
Copied from [place] [id number]. Topic: Quick Communication - See Telephone Encounter >> Aug 05, 2017 11:03 AM Percell Belt A wrote: CRM for notification. See Telephone encounter for: 08/05/17.  Pt wife called in and would like the CMA to call her when the results are ready.  [name] Is is on the dpr and we can speak with her.   Wife also wanted to talk to cma about getting stronger pain meds for pt?    Best number -[phone]

## 2017-08-05 NOTE — Patient Instructions (Signed)
You will get a call from home health to come to your home to evaluate for safety.   Do not take more than ibuprofen 2 tablets every 8 hours or Alleve 2 tablets every 12 hours (one or the other, not both)  Elevate arm when possible, can use ice/heat for comfort as well as ace wrap- remove daily and can reapply  Follow up in 1 month  I will call you about labs and fluid medicine

## 2017-08-06 ENCOUNTER — Encounter: Payer: Self-pay | Admitting: Family Medicine

## 2017-08-15 ENCOUNTER — Telehealth: Payer: Self-pay

## 2017-08-15 NOTE — Telephone Encounter (Signed)
PLEASE NOTE: All timestamps contained within this report are represented as [place] Standard Time. CONFIDENTIALTY NOTICE: This fax transmission is intended only for the addressee. It contains information that is legally privileged, confidential or otherwise protected from use or disclosure. If you are not the intended recipient, you are strictly prohibited from reviewing, disclosing, copying using or disseminating any of this information or taking any action in reliance on or regarding this information. If you have received this fax in error, please notify us immediately by telephone so that we can arrange for its return to [place]. Phone: [phone], Toll-Free: [phone], Fax: [phone] Page: 1 of 1 Call Id: [id number] [place] Night - Client Nonclinical Telephone Record [place] Night - Client Client Site [place] - Night Contact Type Call Who Is Calling Patient / Member / Family / Caregiver Caller Name [employer] Phone Number [phone] Patient Name [name] Patient DOB Jun 26, 1945 Call Type Message Only Information Provided Reason for Call Request for [facility] [place] refused bath. Additional Comment Call Closed By: Lawernce Keas Transaction Date/Time: 08/13/2017 5:40:19 PM (ET)

## 2017-08-15 NOTE — Telephone Encounter (Signed)
Noted  

## 2017-08-17 ENCOUNTER — Telehealth: Payer: Self-pay | Admitting: Family Medicine

## 2017-08-17 NOTE — Telephone Encounter (Signed)
Called and spoke to [place] her of Deborah's recommendations. Nothing further needed at this time.

## 2017-08-17 NOTE — Telephone Encounter (Signed)
Copied from [place] [id number]. Topic: Quick Communication - See Telephone Encounter >> Aug 17, 2017  9:55 AM Mylinda Latina, NT wrote: CRM for notification. See Telephone encounter for: 08/17/17. Dorian Pod calling from [facility] . Patient had a fall a few weeks ago and it was x ray no Fx. He states he is having shooting pain up and down his left arm. Dorian Pod states the patient vitals is good and he is not complaining of any chest pains. He had Tramadol and he states that it does not work for the pain. Patient states that he would like something different for the pain . Please advise.  Pleased call Dorian Pod with what Neoma Laming wants to do . [address]# [place], [place] [address] [phone] (Phone) [phone] (Fax)

## 2017-08-17 NOTE — Telephone Encounter (Signed)
Please call Dorian Pod at [facility] and let her know that due to patient's fall risk, I am not able to prescribe anything stronger. He can come in for a recheck if needed.

## 2017-08-21 DIAGNOSIS — R413 Other amnesia: Secondary | ICD-10-CM

## 2017-08-21 DIAGNOSIS — M79602 Pain in left arm: Secondary | ICD-10-CM

## 2017-08-21 DIAGNOSIS — I2510 Atherosclerotic heart disease of native coronary artery without angina pectoris: Secondary | ICD-10-CM

## 2017-08-21 DIAGNOSIS — I10 Essential (primary) hypertension: Secondary | ICD-10-CM

## 2017-08-21 DIAGNOSIS — Z87891 Personal history of nicotine dependence: Secondary | ICD-10-CM

## 2017-08-21 DIAGNOSIS — R296 Repeated falls: Secondary | ICD-10-CM

## 2017-08-21 DIAGNOSIS — M6281 Muscle weakness (generalized): Secondary | ICD-10-CM

## 2017-08-21 DIAGNOSIS — F1010 Alcohol abuse, uncomplicated: Secondary | ICD-10-CM

## 2017-08-21 DIAGNOSIS — Z743 Need for continuous supervision: Secondary | ICD-10-CM

## 2017-08-21 DIAGNOSIS — R2681 Unsteadiness on feet: Secondary | ICD-10-CM

## 2017-08-23 ENCOUNTER — Ambulatory Visit: Payer: Self-pay | Admitting: *Deleted

## 2017-08-23 ENCOUNTER — Other Ambulatory Visit: Payer: Self-pay | Admitting: *Deleted

## 2017-08-23 ENCOUNTER — Other Ambulatory Visit: Payer: Self-pay | Admitting: Family Medicine

## 2017-08-23 NOTE — Telephone Encounter (Signed)
Pt reports lightheadedness x 1 week. States intermittent, resolves after sitting down "For a while." States positional, worse when going from sitting to standing. Pt uses cane to ambulate. States "I probably don't stay hydrated." Reports BP usually runs low but has not checked "In a long while," does not have access to monitor. Also reports left arm pain, ongoing for "Many years" after dislocated shoulder. States Dr. Lorelei Pont gives him "an injection every 5 months and he is long overdue". States has been using lidocaine patches which help "A little." Denies CP, SOB, headache. Pt unable to make it to an appt tomorrow. Appt made with Dr. Lorelei Pont on Thursday per pt's request as D. Carlean Purl has no availability. Care advise given per protocol. Instructed to go to ED if symptoms worsen, stay hydrated.  Reason for Disposition . [1] MILD dizziness (e.g., walking normally) AND [2] has NOT been evaluated by physician for this  (Exception: dizziness caused by heat exposure, sudden standing, or poor fluid intake)  Answer Assessment - Initial Assessment Questions 1. DESCRIPTION: "Describe your dizziness."     Lightheadedness 2. LIGHTHEADED: "Do you feel lightheaded?" (e.g., somewhat faint, woozy, weak upon standing)     Not presently 3. VERTIGO: "Do you feel like either you or the room is spinning or tilting?" (i.e. vertigo)     no 4. SEVERITY: "How bad is it?"  "Do you feel like you are going to faint?" "Can you stand and walk?"   - MILD - walking normally   - MODERATE - interferes with normal activities (e.g., work, school)    - SEVERE - unable to stand, requires support to walk, feels like passing out now.      Mild; "I normally use a cane." 5. ONSET:  "When did the dizziness begin?"     1 week ago 6. AGGRAVATING FACTORS: "Does anything make it worse?" (e.g., standing, change in head position)     "If I get up too fast." 7. HEART RATE: "Can you tell me your heart rate?" "How many beats in 15 seconds?"   (Note: not all patients can do this)       no 8. CAUSE: "What do you think is causing the dizziness?"     Unsure 9. RECURRENT SYMPTOM: "Have you had dizziness before?" If so, ask: "When was the last time?" "What happened that time?"     no 10. OTHER SYMPTOMS: "Do you have any other symptoms?" (e.g., fever, chest pain, vomiting, diarrhea, bleeding)       Left arm pain x 3 weeks, from shoulder to elbow, OK with resting, no use. PAin with use only.  Protocols used: DIZZINESS [facility]

## 2017-08-23 NOTE — Telephone Encounter (Signed)
Pt no longer needs medication/it was [name]/C'ed/thx dmf

## 2017-08-23 NOTE — Telephone Encounter (Signed)
Per previous notes, Seroquel discontinued due to patient fall risk. Please call and let him know.

## 2017-08-23 NOTE — Telephone Encounter (Signed)
Per hx med list appears Dr Sherwood Gambler (ED Dept) d/c seroquel on 07/29/17. Pt last seen by Glenda Chroman FNP 08/05/17.Please advise.

## 2017-08-23 NOTE — Telephone Encounter (Signed)
During triage call pt mentioned he would like refill of Seroquel. States he was told RX faxed to [facility], states he was told they did not receive.   LOV 07/29/17 D. Carlean Purl  [facility] 02/09/17  #90  1 refill  [facility] [place].

## 2017-08-25 ENCOUNTER — Encounter: Payer: Self-pay | Admitting: Family Medicine

## 2017-08-25 ENCOUNTER — Ambulatory Visit (INDEPENDENT_AMBULATORY_CARE_PROVIDER_SITE_OTHER): Payer: Medicare Other | Admitting: Family Medicine

## 2017-08-25 ENCOUNTER — Other Ambulatory Visit: Payer: Self-pay | Admitting: Family Medicine

## 2017-08-25 VITALS — BP 114/82 | HR 92 | Temp 97.5°F | Ht 69.0 in | Wt 194.0 lb

## 2017-08-25 DIAGNOSIS — M12512 Traumatic arthropathy, left shoulder: Secondary | ICD-10-CM | POA: Diagnosis not present

## 2017-08-25 DIAGNOSIS — M25512 Pain in left shoulder: Secondary | ICD-10-CM | POA: Diagnosis not present

## 2017-08-25 DIAGNOSIS — G8929 Other chronic pain: Secondary | ICD-10-CM

## 2017-08-25 DIAGNOSIS — S72002A Fracture of unspecified part of neck of left femur, initial encounter for closed fracture: Secondary | ICD-10-CM | POA: Diagnosis not present

## 2017-08-25 DIAGNOSIS — K7030 Alcoholic cirrhosis of liver without ascites: Secondary | ICD-10-CM

## 2017-08-25 DIAGNOSIS — G894 Chronic pain syndrome: Secondary | ICD-10-CM

## 2017-08-25 DIAGNOSIS — R296 Repeated falls: Secondary | ICD-10-CM | POA: Diagnosis not present

## 2017-08-25 MED ORDER — QUETIAPINE FUMARATE 300 MG PO TABS: 300.0000 mg | ORAL_TABLET | Freq: Every day | ORAL | 1 refills | Status: DC

## 2017-08-25 MED ORDER — METOPROLOL SUCCINATE ER 25 MG PO TB24: 12.5000 mg | ORAL_TABLET | Freq: Every day | ORAL | 0 refills | Status: DC

## 2017-08-25 MED ORDER — METHYLPREDNISOLONE ACETATE 40 MG/ML IJ SUSP
80.0000 mg | Freq: Once | INTRAMUSCULAR | Status: AC
Start: 2017-08-25 — End: 2017-08-25
  Administered 2017-08-25: 80 mg via INTRA_ARTICULAR

## 2017-08-25 MED ORDER — PANTOPRAZOLE SODIUM 40 MG PO TBEC: 40.0000 mg | DELAYED_RELEASE_TABLET | Freq: Every day | ORAL | 3 refills | Status: DC

## 2017-08-25 NOTE — Telephone Encounter (Signed)
Called an spoke to patients wife informing her of previous not. Wife states that patient is experiencing some depression and has been taken off lexapro. She would like to know what patient should take?

## 2017-08-25 NOTE — Patient Instructions (Signed)
Cut metoprolol tablet in half, so now you will only take 1/2 tablet a day

## 2017-08-25 NOTE — Progress Notes (Signed)
Dr. Frederico Hamman T. Eliud Polo, MD, [facility] [place], [address] Phone: [phone] Fax: [phone]  08/25/2017  Patient: [name], MRN: [id number], DOB: 13-May-1945, 72 y.o.  Primary Physician:  Elby Beck, FNP   Chief Complaint  Patient presents with  . Shoulder Pain    Left  . Dizziness  . Leg Swelling   Subjective:   [name] is a 72 y.o. very pleasant male patient who presents with the following:  Is a well-known patient who I remember over years.  He is a chronic alcoholic with cirrhosis and he continues to drink alcohol and has had multiple severe falls and traumas relatively recently having had a hip fracture as well as a recent fall within the last few weeks because some extensive upper extremity bruising and injury as well as large hematoma.  He is having some lightheadedness with standing and occasionally getting some hazy vision.  This is not a new problem and he has been having it for a long time.  He also has some mild leg swelling and is wearing some compression stockings intermittently.  He also has end-stage glenohumeral osteoarthritis as well as he has some hardware from his prior humerus fracture that extends into the joint.  This is well-known.  This is been discussed with him by myself as well as Dr. Marcelino Scot in the past.  Getting lightheaded some and had some hazy vision  Orthostasis with standing Lower metoprolol  L shoulder injection  Past Medical History, Surgical History, Social History, Family History, Problem List, Medications, and Allergies have been reviewed and updated if relevant.  Patient Active Problem List   Diagnosis Date Noted  . Frequent falls 06/29/2017  . Fall 04/09/2017  . Scalp laceration   . Hypomagnesemia 10/25/2016  . Hip fracture ([place]) 10/24/2016  . Displaced fracture of left femoral neck ([place]) 10/24/2016  . Thrombocytopenia ([place]) 10/24/2016  . Closed  displaced fracture of left femoral neck ([place]) 10/23/2016  . Contracture of muscle of left hand 09/15/2016  . Infected laceration 07/08/2016  . Generalized muscle weakness 07/08/2016  . Anemia 05/18/2016  . Syncope and collapse   . Syncope 05/17/2016  . Cellulitis 05/17/2016  . Rib fractures 05/17/2016  . Bullous pemphigoid 05/17/2016  . ETOH abuse 03/17/2016  . Dizziness and giddiness 12/09/2015  . Chronic pain syndrome 12/09/2015  . Arthritis 11/20/2015  . Mild CAD   . DCM (dilated cardiomyopathy) ([place])   . Abnormal nuclear stress test 05/07/2015  . PAC (premature atrial contraction) 04/07/2015  . Atrial tachycardia, paroxysmal ([place]) 03/10/2015  . Depression 04/12/2012  . C2 cervical fracture ([place]) 02/29/2012  . Alcohol withdrawal ([place]) 11/06/2011  . Fracture of humerus, proximal, left, closed 11/02/2011  . Hyponatremia 11/17/2010  . H/O: upper GI bleed 10/20/2010  . Stomach ulcer from aspirin/ibuprofen-like drugs (NSAID's) 10/20/2010  . Colon polyp 08/26/2010  . Diverticulosis of colon (without mention of hemorrhage) 08/26/2010  . BPH (benign prostatic hyperplasia) 07/02/2010  . Deficiency anemia 03/09/2010  . Hyposmolality and/or hyponatremia 10/17/2009  . ALCOHOLIC CIRRHOSIS OF LIVER 07/25/2009  . Hypokalemia 04/03/2007  . Anxiety state 01/24/2007  . HLD (hyperlipidemia) 07/28/2006  . Alcohol abuse 07/28/2006  . Essential hypertension 07/28/2006  . IMPOTENCE, ORGANIC ORIGIN 07/28/2006    Past Medical History:  Diagnosis Date  . Adenomatous polyp of colon 2006  . Anemia 2012  . Anxiety   . Arthritis    "left arm/shoulder" (05/17/2016)  .  Atrial tachycardia, paroxysmal ([place]) 04/07/2015  . BPH (benign prostatic hypertrophy) 5/99  . Chronic bronchitis ([place])    "q year or q other year" (11/02/2011)  . Chronic nausea    "a few days/week" (05/17/2016)  . Colon polyp   . DCM (dilated cardiomyopathy) ([place])    EF 45-50% by echo and cath 2017  . Depression    Hx of  .  Falls frequently    "daily lately; legs just give out" (11/02/2011; 05/17/2016)  . GERD (gastroesophageal reflux disease) 07/31/04   gastritis   . H/O hiatal hernia   . Hard of hearing   . Heart murmur   . Hernia, umbilical    "unrepaired" (11/02/2011; 05/17/2016)  . History of blood transfusion 2012  . HLD (hyperlipidemia) 5/99  . HTN (hypertension) 5/99  . Memory loss    "recently has been forgetting alot; maybe related to the alcohol" (11/02/2011; 05/17/2016)  . Mild CAD    10% RCA  . PAC (premature atrial contraction) 04/07/2015  . Peripheral vascular disease ([place])   . Pneumonia 1996   hosp  . Pollen allergy    pollen/ragweed  . Seasonal allergies     Past Surgical History:  Procedure Laterality Date  . BACK SURGERY    . CARDIAC CATHETERIZATION N/A 05/23/2015   Procedure: Left Heart Cath and Coronary Angiography;  Surgeon: Troy Sine, MD;  Location: [facility];  Service: Cardiovascular;  Laterality: N/A;  . CATARACT EXTRACTION W/ INTRAOCULAR LENS  IMPLANT, BILATERAL Bilateral 10/2002  . COLONOSCOPY  07/31/04   multiple polyps; repeat in 3 years  . DUPUYTREN CONTRACTURE RELEASE Left   . ETT myoview  03/09/07   nml EF 66%  . FRACTURE SURGERY    . HEMORRHOID SURGERY     "cauterized a long time ago" (11/02/2011)  . hosp CP R/O'D 2/24  03/08/07  . neck MRI  6/04   C5-6 disc abnormality  . ORIF HUMERUS FRACTURE  11/04/2011   Procedure: OPEN REDUCTION INTERNAL FIXATION (ORIF) PROXIMAL HUMERUS FRACTURE;  Surgeon: Rozanna Box, MD;  Location: [facility];  Service: Orthopedics;  Laterality: Left;  . POSTERIOR CERVICAL FUSION/FORAMINOTOMY N/A 03/21/2012   Procedure: POSTERIOR CERVICAL FUSION/FORAMINOTOMY LEVEL ONE;  Surgeon: Charlie Pitter, MD;  Location: [facility];  Service: Neurosurgery;  Laterality: N/A;  POSTERIOR CERVICAL ONE TO CERVICAL TWO FUSION WITH ILIAC CREST GRAFT AND LATERAL MASS SCREWS   . TONSILLECTOMY     "I was young" (11/02/2011)  . TOTAL HIP ARTHROPLASTY Left  10/24/2016   Procedure: TOTAL HIP ARTHROPLASTY ANTERIOR APPROACH;  Surgeon: Rod Can, MD;  Location: [facility];  Service: Orthopedics;  Laterality: Left;  . UPPER GASTROINTESTINAL ENDOSCOPY      Social History   Socioeconomic History  . Marital status: Married    Spouse name: Not on file  . Number of children: 3  . Years of education: Not on file  . Highest education level: Not on file  Occupational History  . Occupation: Academic librarian: [employer]  Social Needs  . Financial resource strain: Not on file  . Food insecurity:    Worry: Not on file    Inability: Not on file  . Transportation needs:    Medical: Not on file    Non-medical: Not on file  Tobacco Use  . Smoking status: Former Smoker    Types: Pipe  . Smokeless tobacco: Never Used  . Tobacco comment: 05/17/2016 "stopped in the late 1990s"  Substance and Sexual Activity  . Alcohol use: Yes    Alcohol/week: 2.0 - 3.0 standard drinks    Types: 2 - 3 Glasses of wine per week    Comment: 11/02/2011 "large box of wine q 2 days" 01-27-15 2 glass a day of wine; 05/17/2016 "1.75L bottles; 1- 1 1/2 bottles/day; somedays worse than others; at least 3-4 days/week he'll have 1 1/2 bottles; last drink was 05/16/2016" . 2-3 glasses wine daily (06/29/17)  . Drug use: No  . Sexual activity: Never  Lifestyle  . Physical activity:    Days per week: Not on file    Minutes per session: Not on file  . Stress: Not on file  Relationships  . Social connections:    Talks on phone: Not on file    Gets together: Not on file    Attends religious service: Not on file    Active member of club or organization: Not on file    Attends meetings of clubs or organizations: Not on file    Relationship status: Not on file  . Intimate partner violence:    Fear of current or ex partner: Not on file    Emotionally abused: Not on file    Physically abused: Not on file    Forced sexual activity: Not on file  Other Topics Concern  . Not  on file  Social History Narrative   Married (remarried), lives with wife; 3 children, 1 at home.    [employer] - mixes colors       Pt revised designated party release form Elek Holderness, wife [phone] - can leave msg.     Allen Norris 03/04/10.        Would desire CPR.  Would not want prolonged life support if futile.    Family History  Problem Relation Age of Onset  . Heart disease Father   . Diabetes Father   . Stroke Father   . Depression Mother   . Heart disease Brother   . Alcohol abuse Brother   . Liver disease Brother     Allergies  Allergen Reactions  . Nifedipine Swelling    11/02/2011 pt denies this allergy (??)    Medication list reviewed and updated in full in [facility].  GEN: No fevers, chills. Nontoxic. Primarily MSK c/o today. MSK: Detailed in the HPI GI: tolerating PO intake without difficulty Neuro: No numbness, parasthesias, or tingling associated. Otherwise the pertinent positives of the ROS are noted above.   Objective:   BP 114/82   Pulse 92   Temp (!) 97.5 F (36.4 C) (Oral)   Ht 5\' 9"  (1.753 m)   Wt 194 lb (88 kg)   SpO2 97%   BMI 28.65 kg/m    GEN: WDWN, NAD, Non-toxic, Alert & Oriented x 3 HEENT: Atraumatic, Normocephalic.  Ears and Nose: No external deformity. EXTR: No clubbing/cyanosis/edema NEURO: Normal gait.  PSYCH: Normally interactive. Conversant. Not depressed or anxious appearing.  Calm demeanor.    He is able to abduct his left shoulder approximately 90 degrees and flexion to 90 degrees.  Rotational movements are limited.  Strength is 4-4+ in all directions.  Radiology: CLINICAL DATA:  72 year old male status post fall with left shoulder pain.  EXAM: LEFT SHOULDER - 2+ VIEW  COMPARISON:  Left shoulder series 02/20/2016 and earlier.  FINDINGS: Chronic proximal left humerus ORIF and healed fracture. Hardware is stable since 2015, including a single pin or screw which extends superficial to the  plate by 3-4 millimeters. No glenohumeral joint dislocation. There is chronic severe glenohumeral joint space loss with subchondral sclerosis and inferior osteophytosis. Chronic resorption of the distal left clavicle is stable since 2018 but has progressed since 2015. There are numerous chronic left lateral rib fractures. No acute osseous abnormality identified. Stable visible left chest.  IMPRESSION: 1.  No acute osseous abnormality identified about the left shoulder. 2. Chronic proximal left humerus ORIF and severe left glenohumeral joint degeneration. 3. Chronic changes to the distal left clavicle, chronic left rib fractures.   Electronically Signed   By: Genevie Ann M.D.   On: 08/05/2017 08:44  Assessment and Plan:   Traumatic arthropathy of shoulder, left - Plan: methylPREDNISolone acetate (DEPO-MEDROL) injection 80 mg  Chronic left shoulder pain - Plan: methylPREDNISolone acetate (DEPO-MEDROL) injection 80 mg  Alcoholic cirrhosis, unspecified whether ascites present (HCC)  Chronic pain syndrome  Closed displaced fracture of left femoral neck (HCC)  Frequent falls  Notably, the patient asked me for opioids as well as sleeping pills, and I do not think this is appropriate at all given the circumstances.  Given his orthostasis with movement, cut his metoprolol down to a half a tablet only.  Given his risk for fall, decreasing this or discontinuing this might be an appropriate next step if symptoms persist.  With his persistent pain and dysfunction, I think it is reasonable to do an intra-articular injection here.  This is only for some short-term pain relief.  If he is medically stable, a shoulder replacement would be the only definitive step at this point for him.  Intrarticular Shoulder Injection, L Verbal consent was obtained from the patient. Risks including infection explained and contrasted with benefits and alternatives. Patient prepped with Chloraprep and Ethyl  Chloride used for anesthesia. An intraarticular shoulder injection was performed using the posterior approach. The patient tolerated the procedure well and had decreased pain post injection. No complications. Injection: 8 cc of Lidocaine 1% and 2 mL Depo-Medrol 40 mg. Needle: 22 gauge   Follow-up: Return for Medicare Physical and f/u with [facility].  Meds ordered this encounter  Medications  . pantoprazole (PROTONIX) 40 MG tablet    Sig: Take 1 tablet (40 mg total) by mouth daily.    Dispense:  90 tablet    Refill:  3  . QUEtiapine (SEROQUEL) 300 MG tablet    Sig: Take 1 tablet (300 mg total) by mouth at bedtime.    Dispense:  90 tablet    Refill:  1  . metoprolol succinate (TOPROL-XL) 25 MG 24 hr tablet    Sig: Take 0.5 tablets (12.5 mg total) by mouth daily.    Dispense:  90 tablet    Refill:  0  . methylPREDNISolone acetate (DEPO-MEDROL) injection 80 mg     Signed,  Meganne Rita T. Keeton Kassebaum, MD   Allergies as of 08/25/2017      Reactions   Nifedipine Swelling   11/02/2011 pt denies this allergy (??)      Medication List        Accurate as of 08/25/17  5:54 PM. Always use your most recent med list.          cetirizine 10 MG tablet Commonly known as:  ZYRTEC Take 1 tablet (10 mg total) daily by mouth.   cyclobenzaprine 10 MG tablet Commonly known as:  FLEXERIL Take 1 tablet (10 mg total) by mouth daily as needed for muscle spasms.   ferrous sulfate 325 (65 FE) MG tablet Take 325  mg by mouth daily with breakfast.   fluticasone 50 MCG/ACT nasal spray Commonly known as:  FLONASE Place 1 spray into both nostrils 2 (two) times daily as needed for allergies.   folic acid 1 MG tablet Commonly known as:  FOLVITE Take 1 tablet (1 mg total) by mouth daily.   gabapentin 100 MG capsule Commonly known as:  NEURONTIN Take 2 capsules (200 mg total) by mouth 2 (two) times daily.   ibuprofen 400 MG tablet Commonly known as:  ADVIL,MOTRIN Take 1 tablet (400 mg total)  by mouth every 6 (six) hours as needed for moderate pain (neck pain).   lidocaine 5 % Commonly known as:  LIDODERM Place 1 patch onto the skin daily. Remove & Discard patch within 12 hours or as directed by MD   magnesium oxide 400 (241.3 Mg) MG tablet Commonly known as:  MAG-OX Take 1 tablet (400 mg total) by mouth daily.   metoprolol succinate 25 MG 24 hr tablet Commonly known as:  TOPROL-XL Take 0.5 tablets (12.5 mg total) by mouth daily.   multivitamin with minerals Tabs tablet Take 1 tablet by mouth daily.   pantoprazole 40 MG tablet Commonly known as:  PROTONIX Take 1 tablet (40 mg total) by mouth daily.   potassium chloride SA 20 MEQ tablet Commonly known as:  K-DUR,KLOR-CON Take 1 tablet (20 mEq total) by mouth daily.   KLOR-CON M15 15 MEQ tablet Generic drug:  potassium chloride SA   promethazine 25 MG tablet Commonly known as:  PHENERGAN TAKE 1 TABLET DAILY AS NEEDED FOR NAUSEA   QUEtiapine 300 MG tablet Commonly known as:  SEROQUEL Take 1 tablet (300 mg total) by mouth at bedtime.   spironolactone 25 MG tablet Commonly known as:  ALDACTONE   thiamine 100 MG tablet Commonly known as:  VITAMIN B-1 Take 100 mg by mouth daily. Reported on 02/17/2015   VITAMIN B-12 PO Take 1 tablet by mouth daily.

## 2017-08-26 ENCOUNTER — Telehealth: Payer: Self-pay | Admitting: *Deleted

## 2017-08-26 ENCOUNTER — Other Ambulatory Visit: Payer: Self-pay | Admitting: Family Medicine

## 2017-08-26 MED ORDER — DULOXETINE HCL 30 MG PO CPEP: 30.0000 mg | ORAL_CAPSULE | Freq: Every day | ORAL | 1 refills | Status: DC

## 2017-08-26 NOTE — Telephone Encounter (Signed)
Per D. Carlean Purl, patient's Seroquel was stopped in hospital due to patient's fall history and should not have been refilled yesterday when patient saw Dr. Lorelei Pont.    I called and spoke with [facility] and cancelled that prescription.

## 2017-08-26 NOTE — Telephone Encounter (Signed)
Please call patient's wife and tell her that I'm not sure why he was taken off lexapro, but I have sent in a new prescription for duloxetine to see if this will help his depression, anxiety and pain.

## 2017-08-26 NOTE — Progress Notes (Signed)
Escitalopram stopped in ER, patient with worsening depressive symptoms, will try duloxetine 30 mg po qd and follow up in 4 weeks.

## 2017-08-26 NOTE — Telephone Encounter (Signed)
Called and spoke to patients wife informing her of note. Understanding verbalized nothing further needed at this time.

## 2017-08-26 NOTE — Addendum Note (Signed)
Addended by: Carter Kitten on: 08/26/2017 08:58 AM   Modules accepted: Orders

## 2017-08-30 ENCOUNTER — Telehealth: Payer: Self-pay | Admitting: Family Medicine

## 2017-08-30 NOTE — Telephone Encounter (Signed)
Request has been sent to PCP; see 8/16 phone note

## 2017-08-30 NOTE — Telephone Encounter (Signed)
Opened in error  Copied from [place] [facility] [id number]. Topic: Appointment Scheduling - Scheduling Inquiry for Clinic >> Aug 30, 2017  9:06 AM Synthia Innocent wrote: Reason for CRM: Patient states he did not receive CPE in March 2019 with [name]. Requesting to schedule CPE. Please advise. Able to schedule?

## 2017-08-30 NOTE — Telephone Encounter (Signed)
Spoke to pt and advised regarding seroquel. Pt is wanting to know if there is a sleep med he can be prescribed in its place, since he "cannot fall at night while he's sleeping." Requesting new med be sent to [facility]

## 2017-08-30 NOTE — Telephone Encounter (Signed)
Copied from [place] [id number]. Topic: Quick Communication - Rx Refill/Question >> Aug 30, 2017 11:37 AM Lysle Morales L, NT wrote: Medication: Seroquel 300 mg or something else comparable , this is not on the patients med list   Has the patient contacted their pharmacy? no (Agent: If no, request that the patient contact the pharmacy for the refill. (Agent: If yes, when and what did the pharmacy advise?  Preferred Pharmacy (with phone number or street name [facility] for [facility] - [address], [place] [phone] (Phone) [phone] (Fax)    Agent: Please be advised that RX refills may take up to 3 business days. We ask that you follow-up with your pharmacy.

## 2017-08-30 NOTE — Telephone Encounter (Signed)
He is not a candidate for sleep meds at this time due to his increased risk of falling. He can try over the counter melatonin and needs to have a follow up visit to discuss.

## 2017-08-30 NOTE — Telephone Encounter (Signed)
Patient checking status of refill  request, patient would like a return call. CB # [phone]

## 2017-08-30 NOTE — Telephone Encounter (Signed)
Erroneous encounter.  Duplicate encounter created in error.  Please refer to [facility] order for patient under 'referrals".  Thanks.

## 2017-08-31 NOTE — Telephone Encounter (Signed)
Called and spoke to patients wife she states that he is doing melatonin and a sleep aid liquid. She states that she gives him trazadone, flexeril, and gabapentin at night. But patient sleeps on and off through out the day.

## 2017-09-02 NOTE — Telephone Encounter (Signed)
Please call patient's wife and tell her that it is likely his daytime napping and inactivity are making it difficult for him to sleep at night. We can discuss this further at follow up.

## 2017-09-06 ENCOUNTER — Encounter: Payer: Self-pay | Admitting: Family Medicine

## 2017-09-06 ENCOUNTER — Ambulatory Visit (INDEPENDENT_AMBULATORY_CARE_PROVIDER_SITE_OTHER): Payer: Medicare Other | Admitting: Family Medicine

## 2017-09-06 VITALS — BP 120/78 | HR 102 | Temp 98.2°F | Ht 69.0 in | Wt 187.5 lb

## 2017-09-06 DIAGNOSIS — F1010 Alcohol abuse, uncomplicated: Secondary | ICD-10-CM | POA: Diagnosis not present

## 2017-09-06 DIAGNOSIS — J029 Acute pharyngitis, unspecified: Secondary | ICD-10-CM

## 2017-09-06 DIAGNOSIS — J020 Streptococcal pharyngitis: Secondary | ICD-10-CM

## 2017-09-06 LAB — POCT RAPID STREP A (OFFICE): Rapid Strep A Screen: POSITIVE — AB

## 2017-09-06 MED ORDER — AMOXICILLIN 500 MG PO CAPS: 500.0000 mg | ORAL_CAPSULE | Freq: Two times a day (BID) | ORAL | 0 refills | Status: DC

## 2017-09-06 NOTE — Assessment & Plan Note (Signed)
Encouraged ongoing attempts at alcohol cessation. rec avoid alcohol with NSAID use.

## 2017-09-06 NOTE — Assessment & Plan Note (Signed)
RST positive Rx amoxicillin 500mg  bid x 10 days. Supportive care as per instructions Update if not improving with treatment.

## 2017-09-06 NOTE — Telephone Encounter (Signed)
Called and spoke to patient's wife informing her. Understanding verbalized nothing further needed at this time.

## 2017-09-06 NOTE — Progress Notes (Signed)
BP 120/78 (BP Location: Right Arm, Patient Position: Sitting, Cuff Size: Normal)   Pulse (!) 102   Temp 98.2 F (36.8 C) (Oral)   Ht 5\' 9"  (1.753 m)   Wt 187 lb 8 oz (85 kg)   SpO2 96%   BMI 27.69 kg/m    CC: ST Subjective:    Patient ID: [name], male    DOB: 07-19-1945, 72 y.o.   MRN: [id number]  HPI: [name] is a 72 y.o. male presenting on 09/06/2017 for Sore Throat (C/o sore throat for 4 days. States he is having trouble swallowing. Pt is accompanied by his wife.)   New patient to me. 5d h/o ST, pain with swallowing. Some productive cough (white, brown). Some dysphagia.   Denies fevers/chills, ear or tooth pain, congestion. No vomiting or abdominal pain.  Has tried nasal spray, allergy pills, OTC bronkaid (ephedrine, guaifenesin).  No sick contacts at home.   Non smoker H/o alcoholism with cirrhosis, continues to drink. Poor appetite. Weight fluctuations. Continues drinking when asked how much, "depends" up to 2 bottles of wine a week.  Relevant past medical, surgical, family and social history reviewed and updated as indicated. Interim medical history since our last visit reviewed. Allergies and medications reviewed and updated. Outpatient Medications Prior to Visit  Medication Sig Dispense Refill  . cetirizine (ZYRTEC) 10 MG tablet Take 1 tablet (10 mg total) daily by mouth. 90 tablet 3  . Cyanocobalamin (VITAMIN B-12 PO) Take 1 tablet by mouth daily.    . cyclobenzaprine (FLEXERIL) 10 MG tablet Take 1 tablet (10 mg total) by mouth daily as needed for muscle spasms. 90 tablet 1  . DULoxetine (CYMBALTA) 30 MG capsule Take 1 capsule (30 mg total) by mouth daily. 30 capsule 1  . ferrous sulfate 325 (65 FE) MG tablet Take 325 mg by mouth daily with breakfast.    . fluticasone (FLONASE) 50 MCG/ACT nasal spray Place 1 spray into both nostrils 2 (two) times daily as needed for allergies. 16 g 5  . folic acid (FOLVITE) 1 MG tablet Take 1 tablet (1 mg total) by  mouth daily.    [name] gabapentin (NEURONTIN) 100 MG capsule Take 2 capsules (200 mg total) by mouth 2 (two) times daily. 120 capsule 3  . ibuprofen (ADVIL,MOTRIN) 400 MG tablet Take 1 tablet (400 mg total) by mouth every 6 (six) hours as needed for moderate pain (neck pain). 30 tablet 0  . KLOR-CON M15 15 MEQ tablet     . lidocaine (LIDODERM) 5 % Place 1 patch onto the skin daily. Remove & Discard patch within 12 hours or as directed by MD (Patient taking differently: Place 1 patch onto the skin daily as needed (pain). Remove & Discard patch within 12 hours or as directed by MD) 90 patch 0  . magnesium oxide (MAG-OX) 400 (241.3 Mg) MG tablet Take 1 tablet (400 mg total) by mouth daily. 30 tablet 0  . metoprolol succinate (TOPROL-XL) 25 MG 24 hr tablet Take 0.5 tablets (12.5 mg total) by mouth daily. 90 tablet 0  . Multiple Vitamin (MULTIVITAMIN WITH MINERALS) TABS tablet Take 1 tablet by mouth daily.    . pantoprazole (PROTONIX) 40 MG tablet Take 1 tablet (40 mg total) by mouth daily. 90 tablet 3  . promethazine (PHENERGAN) 25 MG tablet TAKE 1 TABLET DAILY AS NEEDED FOR NAUSEA 90 tablet 0  . spironolactone (ALDACTONE) 25 MG tablet Take by mouth daily. Takes 1/2 tablet    . Thiamine  HCl (VITAMIN B-1) 100 MG tablet Take 100 mg by mouth daily. Reported on 02/17/2015    . TRAZODONE HCL PO Take by mouth at bedtime.    . potassium chloride SA (K-DUR,KLOR-CON) 20 MEQ tablet Take 1 tablet (20 mEq total) by mouth daily. 30 tablet 0   No facility-administered medications prior to visit.      Per HPI unless specifically indicated in ROS section below Review of Systems     Objective:    BP 120/78 (BP Location: Right Arm, Patient Position: Sitting, Cuff Size: Normal)   Pulse (!) 102   Temp 98.2 F (36.8 C) (Oral)   Ht 5\' 9"  (1.753 m)   Wt 187 lb 8 oz (85 kg)   SpO2 96%   BMI 27.69 kg/m   Wt Readings from Last 3 Encounters:  09/06/17 187 lb 8 oz (85 kg)  08/25/17 194 lb (88 kg)  08/05/17 197 lb (89.4  kg)    Physical Exam  Constitutional: He appears well-developed and well-nourished. No distress.  HENT:  Head: Normocephalic and atraumatic.  Right Ear: Tympanic membrane, external ear and ear canal normal. Decreased hearing is noted.  Left Ear: Tympanic membrane, external ear and ear canal normal. Decreased hearing is noted.  Nose: Nose normal. No mucosal edema or rhinorrhea. Right sinus exhibits no maxillary sinus tenderness and no frontal sinus tenderness. Left sinus exhibits no maxillary sinus tenderness and no frontal sinus tenderness.  Mouth/Throat: Uvula is midline and mucous membranes are normal. Posterior oropharyngeal edema and posterior oropharyngeal erythema present. No oropharyngeal exudate or tonsillar abscesses.  Eyes: Pupils are equal, round, and reactive to light. Conjunctivae and EOM are normal. No scleral icterus.  Neck: Normal range of motion. Neck supple.  Cardiovascular: Normal rate, regular rhythm, normal heart sounds and intact distal pulses.  No murmur heard. Pulmonary/Chest: Effort normal and breath sounds normal. No respiratory distress. He has no wheezes. He has no rales.  Lymphadenopathy:    He has cervical adenopathy (R AC LAD).  Skin: Skin is warm and dry. No rash noted.  Nursing note and vitals reviewed.      Assessment & Plan:   Problem List Items Addressed This Visit    Alcohol abuse    Encouraged ongoing attempts at alcohol cessation. rec avoid alcohol with NSAID use.       Acute streptococcal pharyngitis - Primary    RST positive Rx amoxicillin 500mg  bid x 10 days. Supportive care as per instructions Update if not improving with treatment.        Other Visit Diagnoses    Sore throat       Relevant Orders   POCT rapid strep A (Completed)       Meds ordered this encounter  Medications  . amoxicillin (AMOXIL) 500 MG capsule    Sig: Take 1 capsule (500 mg total) by mouth 2 (two) times daily.    Dispense:  20 capsule    Refill:  0    Orders Placed This Encounter  Procedures  . POCT rapid strep A    Follow up plan: Return if symptoms worsen or fail to improve.  Ria Bush, MD

## 2017-09-06 NOTE — Patient Instructions (Addendum)
You have strep throat. Take antibiotic provided today. Use ibuprofen more regularly with meals for the next 3 days to help with sore throat.  Avoid alcohol while on ibuprofen.  Push fluids and rest. Let us know if not improving with treatment.   Strep Throat Strep throat is a bacterial infection of the throat. Your health care provider may call the infection tonsillitis or pharyngitis, depending on whether there is swelling in the tonsils or at the back of the throat. Strep throat is most common during the cold months of the year in children who are 19-40 years of age, but it can happen during any season in people of any age. This infection is spread from person to person (contagious) through coughing, sneezing, or close contact. What are the causes? Strep throat is caused by the bacteria called Streptococcus pyogenes. What increases the risk? This condition is more likely to develop in:  People who spend time in crowded places where the infection can spread easily.  People who have close contact with someone who has strep throat.  What are the signs or symptoms? Symptoms of this condition include:  Fever or chills.  Redness, swelling, or pain in the tonsils or throat.  Pain or difficulty when swallowing.  White or yellow spots on the tonsils or throat.  Swollen, tender glands in the neck or under the jaw.  Red rash all over the body (rare).  How is this diagnosed? This condition is diagnosed by performing a rapid strep test or by taking a swab of your throat (throat culture test). Results from a rapid strep test are usually ready in a few minutes, but throat culture test results are available after one or two days. How is this treated? This condition is treated with antibiotic medicine. Follow these instructions at home: Medicines  Take over-the-counter and prescription medicines only as told by your health care provider.  Take your antibiotic as told by your health care  provider. Do not stop taking the antibiotic even if you start to feel better.  Have family members who also have a sore throat or fever tested for strep throat. They may need antibiotics if they have the strep infection. Eating and drinking  Do not share food, drinking cups, or personal items that could cause the infection to spread to other people.  If swallowing is difficult, try eating soft foods until your sore throat feels better.  Drink enough fluid to keep your urine clear or pale yellow. General instructions  Gargle with a salt-water mixture 3-4 times per day or as needed. To make a salt-water mixture, completely dissolve -1 tsp of salt in 1 cup of warm water.  Make sure that all household members wash their hands well.  Get plenty of rest.  Stay home from school or work until you have been taking antibiotics for 24 hours.  Keep all follow-up visits as told by your health care provider. This is important. Contact a health care provider if:  The glands in your neck continue to get bigger.  You develop a rash, cough, or earache.  You cough up a thick liquid that is green, yellow-brown, or bloody.  You have pain or discomfort that does not get better with medicine.  Your problems seem to be getting worse rather than better.  You have a fever. Get help right away if:  You have new symptoms, such as vomiting, severe headache, stiff or painful neck, chest pain, or shortness of breath.  You have severe  throat pain, drooling, or changes in your voice.  You have swelling of the neck, or the skin on the neck becomes red and tender.  You have signs of dehydration, such as fatigue, dry mouth, and decreased urination.  You become increasingly sleepy, or you cannot wake up completely.  Your joints become red or painful. This information is not intended to replace advice given to you by your health care provider. Make sure you discuss any questions you have with your health  care provider. Document Released: 12/26/1999 Document Revised: 08/27/2015 Document Reviewed: 04/22/2014 Elsevier Interactive Patient Education  [employer].

## 2017-09-19 ENCOUNTER — Other Ambulatory Visit: Payer: Self-pay | Admitting: Family Medicine

## 2017-09-19 NOTE — Telephone Encounter (Signed)
Last refilled 06/21/17 # 90 refills 0

## 2017-09-28 ENCOUNTER — Ambulatory Visit: Payer: Medicare Other | Admitting: Family Medicine

## 2017-10-03 ENCOUNTER — Encounter: Payer: Self-pay | Admitting: Family Medicine

## 2017-10-03 ENCOUNTER — Ambulatory Visit (INDEPENDENT_AMBULATORY_CARE_PROVIDER_SITE_OTHER): Payer: Medicare Other | Admitting: Family Medicine

## 2017-10-03 VITALS — BP 142/84 | HR 89 | Temp 97.6°F | Ht 69.0 in | Wt 191.0 lb

## 2017-10-03 DIAGNOSIS — F411 Generalized anxiety disorder: Secondary | ICD-10-CM

## 2017-10-03 DIAGNOSIS — J309 Allergic rhinitis, unspecified: Secondary | ICD-10-CM

## 2017-10-03 DIAGNOSIS — Z23 Encounter for immunization: Secondary | ICD-10-CM

## 2017-10-03 DIAGNOSIS — D509 Iron deficiency anemia, unspecified: Secondary | ICD-10-CM

## 2017-10-03 DIAGNOSIS — M25512 Pain in left shoulder: Secondary | ICD-10-CM

## 2017-10-03 DIAGNOSIS — G8929 Other chronic pain: Secondary | ICD-10-CM

## 2017-10-03 DIAGNOSIS — Z638 Other specified problems related to primary support group: Secondary | ICD-10-CM

## 2017-10-03 DIAGNOSIS — F1010 Alcohol abuse, uncomplicated: Secondary | ICD-10-CM

## 2017-10-03 MED ORDER — DULOXETINE HCL 30 MG PO CPEP: 30.0000 mg | ORAL_CAPSULE | Freq: Every day | ORAL | 1 refills | Status: DC

## 2017-10-03 MED ORDER — CETIRIZINE HCL 10 MG PO TABS: 10.0000 mg | ORAL_TABLET | Freq: Every day | ORAL | 3 refills | Status: DC

## 2017-10-03 MED ORDER — CYCLOBENZAPRINE HCL 10 MG PO TABS: 5.0000 mg | ORAL_TABLET | Freq: Every evening | ORAL | 1 refills | Status: DC | PRN

## 2017-10-03 NOTE — Patient Instructions (Addendum)
Good to see you today  Take your iron with food  Please schedule your Annual Wellness Visit and labs with Leisa followed by a complete physical with me

## 2017-10-03 NOTE — Progress Notes (Signed)
Subjective:    Patient ID: [name], male    DOB: 1945-02-16, 72 y.o.   MRN: [id number]  HPI This is a 72 yo male who presents today for follow up of chronic medical problems. Drove himself today.    Frequent fall history- Has finished with physical therapy. No recent falls. Not using walker, sometimes uses cane. Trying to get more physical activity.   Left shoulder pain- pain resolved with injection by Dr. Lorelei Pont. Some upper left arm pain. No recent injury.    Insomnia-  Takes OTC melatonin. Going to bed around 12 and gets up around 5-6. Once he goes to sleep, is able to generally stay asleep except one trip to bathroom. Has difficulty falling asleep every night.   Anxiety- Generally mood only affected by his son who is in his 11s and living with patient and his wife. Son is unemployed. Wife is currently recovering from knee surgery and staying with her sister who lives nearby.    Alcohol abuse- Not drinking alcohol daily. Keeps a bottle of wine around. When he does drink, consumes entire bottle. Not interested in abstaining. Drinking 1-2 times per week.   Iron supplementation causing some nausea. Taking on empty stomach.   Past Medical History:  Diagnosis Date  . Adenomatous polyp of colon 2006  . Anemia 2012  . Anxiety   . Arthritis    "left arm/shoulder" (05/17/2016)  . Atrial tachycardia, paroxysmal ([place]) 04/07/2015  . BPH (benign prostatic hypertrophy) 5/99  . Chronic bronchitis ([place])    "q year or q other year" (11/02/2011)  . Chronic nausea    "a few days/week" (05/17/2016)  . Colon polyp   . DCM (dilated cardiomyopathy) ([place])    EF 45-50% by echo and cath 2017  . Depression    Hx of  . Falls frequently    "daily lately; legs just give out" (11/02/2011; 05/17/2016)  . GERD (gastroesophageal reflux disease) 07/31/04   gastritis   . H/O hiatal hernia   . Hard of hearing   . Heart murmur   . Hernia, umbilical    "unrepaired" (11/02/2011; 05/17/2016)  . History of  blood transfusion 2012  . HLD (hyperlipidemia) 5/99  . HTN (hypertension) 5/99  . Memory loss    "recently has been forgetting alot; maybe related to the alcohol" (11/02/2011; 05/17/2016)  . Mild CAD    10% RCA  . PAC (premature atrial contraction) 04/07/2015  . Peripheral vascular disease ([place])   . Pneumonia 1996   hosp  . Pollen allergy    pollen/ragweed  . Seasonal allergies    Past Surgical History:  Procedure Laterality Date  . BACK SURGERY    . CARDIAC CATHETERIZATION N/A 05/23/2015   Procedure: Left Heart Cath and Coronary Angiography;  Surgeon: Troy Sine, MD;  Location: [facility];  Service: Cardiovascular;  Laterality: N/A;  . CATARACT EXTRACTION W/ INTRAOCULAR LENS  IMPLANT, BILATERAL Bilateral 10/2002  . COLONOSCOPY  07/31/04   multiple polyps; repeat in 3 years  . DUPUYTREN CONTRACTURE RELEASE Left   . ETT myoview  03/09/07   nml EF 66%  . FRACTURE SURGERY    . HEMORRHOID SURGERY     "cauterized a long time ago" (11/02/2011)  . hosp CP R/O'D 2/24  03/08/07  . neck MRI  6/04   C5-6 disc abnormality  . ORIF HUMERUS FRACTURE  11/04/2011   Procedure: OPEN REDUCTION INTERNAL FIXATION (ORIF) PROXIMAL HUMERUS FRACTURE;  Surgeon: Rozanna Box, MD;  Location: [facility];  Service: Orthopedics;  Laterality: Left;  . POSTERIOR CERVICAL FUSION/FORAMINOTOMY N/A 03/21/2012   Procedure: POSTERIOR CERVICAL FUSION/FORAMINOTOMY LEVEL ONE;  Surgeon: Charlie Pitter, MD;  Location: [facility];  Service: Neurosurgery;  Laterality: N/A;  POSTERIOR CERVICAL ONE TO CERVICAL TWO FUSION WITH ILIAC CREST GRAFT AND LATERAL MASS SCREWS   . TONSILLECTOMY     "I was young" (11/02/2011)  . TOTAL HIP ARTHROPLASTY Left 10/24/2016   Procedure: TOTAL HIP ARTHROPLASTY ANTERIOR APPROACH;  Surgeon: Rod Can, MD;  Location: [facility];  Service: Orthopedics;  Laterality: Left;  . UPPER GASTROINTESTINAL ENDOSCOPY     Family History  Problem Relation Age of Onset  . Heart disease Father   .  Diabetes Father   . Stroke Father   . Depression Mother   . Heart disease Brother   . Alcohol abuse Brother   . Liver disease Brother    Social History   Tobacco Use  . Smoking status: Former Smoker    Types: Pipe  . Smokeless tobacco: Never Used  . Tobacco comment: 05/17/2016 "stopped in the late 1990s"  Substance Use Topics  . Alcohol use: Yes    Alcohol/week: 2.0 - 3.0 standard drinks    Types: 2 - 3 Glasses of wine per week    Comment: 11/02/2011 "large box of wine q 2 days" 01-27-15 2 glass a day of wine; 05/17/2016 "1.75L bottles; 1- 1 1/2 bottles/day; somedays worse than others; at least 3-4 days/week he'll have 1 1/2 bottles; last drink was 05/16/2016" . 2-3 glasses wine daily (06/29/17)  . Drug use: No      Review of Systems     Objective:   Physical Exam  Constitutional: He is oriented to person, place, and time. He appears well-developed and well-nourished. No distress.  Looks well.  HENT:  Head: Normocephalic and atraumatic.  Eyes: Conjunctivae are normal.  Cardiovascular: Normal rate, regular rhythm and normal heart sounds.  Pulmonary/Chest: Effort normal and breath sounds normal.  Musculoskeletal: He exhibits no edema.  Neurological: He is alert and oriented to person, place, and time.  Skin: Skin is warm and dry. He is not diaphoretic.  Psychiatric:  Brighter affect this morning.   Vitals reviewed.     BP (!) 142/84 (BP Location: Right Arm, Patient Position: Sitting, Cuff Size: Normal)   Pulse 89   Temp 97.6 F (36.4 C) (Oral)   Ht 5\' 9"  (1.753 m)   Wt 191 lb (86.6 kg)   SpO2 93%   BMI 28.21 kg/m  Wt Readings from Last 3 Encounters:  10/03/17 191 lb (86.6 kg)  09/06/17 187 lb 8 oz (85 kg)  08/25/17 194 lb (88 kg)       Assessment & Plan:  1. Chronic left shoulder pain - discussed using muscle relaxer sparingly at bedtime, should start with 1/2 tab - cyclobenzaprine (FLEXERIL) 10 MG tablet; Take 0.5-1 tablets (5-10 mg total) by mouth at bedtime  as needed for muscle spasms.  Dispense: 30 tablet; Refill: 1  2. Need for influenza vaccination - Flu Vaccine QUAD 36+ mos IM  3. Allergic rhinitis, unspecified seasonality, unspecified trigger - cetirizine (ZYRTEC) 10 MG tablet; Take 1 tablet (10 mg total) by mouth daily.  Dispense: 90 tablet; Refill: 3  4. Anxiety state - DULoxetine (CYMBALTA) 30 MG capsule; Take 1 capsule (30 mg total) by mouth daily.  Dispense: 90 capsule; Refill: 1  5. Alcohol abuse - Once again, I encouraged complete abstinence and not keeping any ETOH  in his home, he is not open to this currently  6. Iron deficiency anemia, unspecified iron deficiency anemia type - some nausea with iron, discussed taking with food, will recheck at next visit  7. Stress due to family tension - Discussed seeing a counselor. He is not interested at this time.  - Follow up in 2 months for AWV and CPE   [name], FNP-BC  [facility] [place], [place]  10/03/2017 8:49 AM

## 2017-10-17 ENCOUNTER — Other Ambulatory Visit: Payer: Self-pay | Admitting: Family Medicine

## 2017-10-17 ENCOUNTER — Telehealth: Payer: Self-pay

## 2017-10-17 DIAGNOSIS — M25552 Pain in left hip: Secondary | ICD-10-CM

## 2017-10-17 DIAGNOSIS — F1010 Alcohol abuse, uncomplicated: Secondary | ICD-10-CM

## 2017-10-17 NOTE — Telephone Encounter (Signed)
Copied from [place] [id number]. Topic: Inquiry >> Oct 17, 2017 10:53 AM Scherrie Gerlach wrote: Reason for CRM: wife calling to advise the spironolactone (ALDACTONE) 25 MG tablet was on the pt's med list on AVS fro last visit. However, she states he should not be taking.  Request to update med list and remove this from his list

## 2017-10-17 NOTE — Telephone Encounter (Signed)
Noted. Removed from list.

## 2017-10-21 ENCOUNTER — Other Ambulatory Visit: Payer: Self-pay

## 2017-10-21 ENCOUNTER — Emergency Department (HOSPITAL_COMMUNITY): Payer: Medicare Other

## 2017-10-21 ENCOUNTER — Encounter (HOSPITAL_COMMUNITY): Payer: Self-pay

## 2017-10-21 ENCOUNTER — Telehealth: Payer: Self-pay

## 2017-10-21 ENCOUNTER — Ambulatory Visit (INDEPENDENT_AMBULATORY_CARE_PROVIDER_SITE_OTHER)
Admission: RE | Admit: 2017-10-21 | Discharge: 2017-10-21 | Disposition: A | Discharge status: Home / Self Care | Payer: Medicare Other | Source: Ambulatory Visit | Attending: Family Medicine | Admitting: Family Medicine

## 2017-10-21 ENCOUNTER — Encounter: Payer: Self-pay | Admitting: Family Medicine

## 2017-10-21 ENCOUNTER — Ambulatory Visit (INDEPENDENT_AMBULATORY_CARE_PROVIDER_SITE_OTHER): Payer: Medicare Other | Admitting: Family Medicine

## 2017-10-21 ENCOUNTER — Emergency Department (HOSPITAL_COMMUNITY)
Admission: EM | Admit: 2017-10-21 | Discharge: 2017-10-22 | Disposition: A | Discharge status: Home / Self Care | Payer: Medicare Other | Attending: Emergency Medicine | Admitting: Emergency Medicine

## 2017-10-21 VITALS — BP 140/80 | HR 117 | Temp 98.2°F | Ht 69.0 in | Wt 189.0 lb

## 2017-10-21 DIAGNOSIS — M79632 Pain in left forearm: Secondary | ICD-10-CM

## 2017-10-21 DIAGNOSIS — R079 Chest pain, unspecified: Secondary | ICD-10-CM

## 2017-10-21 DIAGNOSIS — S52022A Displaced fracture of olecranon process without intraarticular extension of left ulna, initial encounter for closed fracture: Secondary | ICD-10-CM | POA: Insufficient documentation

## 2017-10-21 DIAGNOSIS — M25522 Pain in left elbow: Secondary | ICD-10-CM

## 2017-10-21 DIAGNOSIS — M25422 Effusion, left elbow: Secondary | ICD-10-CM | POA: Diagnosis not present

## 2017-10-21 DIAGNOSIS — W0110XA Fall on same level from slipping, tripping and stumbling with subsequent striking against unspecified object, initial encounter: Secondary | ICD-10-CM | POA: Insufficient documentation

## 2017-10-21 DIAGNOSIS — M25572 Pain in left ankle and joints of left foot: Secondary | ICD-10-CM

## 2017-10-21 DIAGNOSIS — S6992XA Unspecified injury of left wrist, hand and finger(s), initial encounter: Secondary | ICD-10-CM | POA: Diagnosis not present

## 2017-10-21 DIAGNOSIS — Z96642 Presence of left artificial hip joint: Secondary | ICD-10-CM | POA: Insufficient documentation

## 2017-10-21 DIAGNOSIS — S99912A Unspecified injury of left ankle, initial encounter: Secondary | ICD-10-CM | POA: Diagnosis not present

## 2017-10-21 DIAGNOSIS — Z87891 Personal history of nicotine dependence: Secondary | ICD-10-CM | POA: Insufficient documentation

## 2017-10-21 DIAGNOSIS — W19XXXA Unspecified fall, initial encounter: Secondary | ICD-10-CM

## 2017-10-21 DIAGNOSIS — I10 Essential (primary) hypertension: Secondary | ICD-10-CM | POA: Insufficient documentation

## 2017-10-21 DIAGNOSIS — Y999 Unspecified external cause status: Secondary | ICD-10-CM | POA: Insufficient documentation

## 2017-10-21 DIAGNOSIS — M7989 Other specified soft tissue disorders: Secondary | ICD-10-CM | POA: Diagnosis not present

## 2017-10-21 DIAGNOSIS — M25552 Pain in left hip: Secondary | ICD-10-CM

## 2017-10-21 DIAGNOSIS — Y929 Unspecified place or not applicable: Secondary | ICD-10-CM | POA: Diagnosis not present

## 2017-10-21 DIAGNOSIS — Y9389 Activity, other specified: Secondary | ICD-10-CM | POA: Insufficient documentation

## 2017-10-21 DIAGNOSIS — F10920 Alcohol use, unspecified with intoxication, uncomplicated: Secondary | ICD-10-CM

## 2017-10-21 DIAGNOSIS — Z79899 Other long term (current) drug therapy: Secondary | ICD-10-CM | POA: Insufficient documentation

## 2017-10-21 DIAGNOSIS — F1010 Alcohol abuse, uncomplicated: Secondary | ICD-10-CM

## 2017-10-21 DIAGNOSIS — M542 Cervicalgia: Secondary | ICD-10-CM | POA: Diagnosis not present

## 2017-10-21 DIAGNOSIS — S199XXA Unspecified injury of neck, initial encounter: Secondary | ICD-10-CM | POA: Diagnosis not present

## 2017-10-21 DIAGNOSIS — S79912A Unspecified injury of left hip, initial encounter: Secondary | ICD-10-CM | POA: Diagnosis not present

## 2017-10-21 DIAGNOSIS — S42492A Other displaced fracture of lower end of left humerus, initial encounter for closed fracture: Secondary | ICD-10-CM | POA: Diagnosis not present

## 2017-10-21 DIAGNOSIS — S3993XA Unspecified injury of pelvis, initial encounter: Secondary | ICD-10-CM | POA: Diagnosis not present

## 2017-10-21 DIAGNOSIS — S59902A Unspecified injury of left elbow, initial encounter: Secondary | ICD-10-CM | POA: Diagnosis present

## 2017-10-21 LAB — CBC WITH DIFFERENTIAL/PLATELET
Abs Immature Granulocytes: 0.03 10*3/uL (ref 0.00–0.07)
BASOS ABS: 0 10*3/uL (ref 0.0–0.1)
Basophils Relative: 1 %
EOS ABS: 0.1 10*3/uL (ref 0.0–0.5)
Eosinophils Relative: 1 %
HEMATOCRIT: 35.3 % — AB (ref 39.0–52.0)
Hemoglobin: 11.7 g/dL — ABNORMAL LOW (ref 13.0–17.0)
Immature Granulocytes: 1 %
LYMPHS ABS: 0.5 10*3/uL — AB (ref 0.7–4.0)
Lymphocytes Relative: 10 %
MCH: 27.9 pg (ref 26.0–34.0)
MCHC: 33.1 g/dL (ref 30.0–36.0)
MCV: 84.2 fL (ref 80.0–100.0)
Monocytes Absolute: 0.9 10*3/uL (ref 0.1–1.0)
Monocytes Relative: 19 %
NRBC: 0 % (ref 0.0–0.2)
Neutro Abs: 3.3 10*3/uL (ref 1.7–7.7)
Neutrophils Relative %: 68 %
Platelets: 83 10*3/uL — ABNORMAL LOW (ref 150–400)
RBC: 4.19 MIL/uL — ABNORMAL LOW (ref 4.22–5.81)
RDW: 15 % (ref 11.5–15.5)
WBC: 4.9 10*3/uL (ref 4.0–10.5)

## 2017-10-21 LAB — COMPREHENSIVE METABOLIC PANEL
ALBUMIN: 4.1 g/dL (ref 3.5–5.0)
ALT: 32 U/L (ref 0–44)
ANION GAP: 20 — AB (ref 5–15)
AST: 72 U/L — AB (ref 15–41)
Alkaline Phosphatase: 99 U/L (ref 38–126)
BILIRUBIN TOTAL: 0.8 mg/dL (ref 0.3–1.2)
CALCIUM: 8.4 mg/dL — AB (ref 8.9–10.3)
CO2: 20 mmol/L — AB (ref 22–32)
Chloride: 89 mmol/L — ABNORMAL LOW (ref 98–111)
Creatinine, Ser: 0.77 mg/dL (ref 0.61–1.24)
GFR calc Af Amer: >60 mL/min (ref >60)
GFR calc non Af Amer: >60 mL/min (ref >60)
GLUCOSE: 93 mg/dL (ref 70–99)
Potassium: 3.8 mmol/L (ref 3.5–5.1)
SODIUM: 129 mmol/L — AB (ref 135–145)
TOTAL PROTEIN: 6.8 g/dL (ref 6.5–8.1)

## 2017-10-21 LAB — CBG MONITORING, ED: Glucose-Capillary: 103 mg/dL — ABNORMAL HIGH (ref 70–99)

## 2017-10-21 LAB — ETHANOL: Alcohol, Ethyl (B): 189 mg/dL — ABNORMAL HIGH (ref <10)

## 2017-10-21 MED ORDER — LORAZEPAM 1 MG PO TABS: 0.0000 mg | ORAL_TABLET | Freq: Two times a day (BID) | ORAL | Status: DC

## 2017-10-21 MED ORDER — LORAZEPAM 2 MG/ML IJ SOLN
0.0000 mg | Freq: Four times a day (QID) | INTRAMUSCULAR | Status: DC
  Administered 2017-10-21: 1 mg via INTRAVENOUS
  Filled 2017-10-21 (×2): qty 1

## 2017-10-21 MED ORDER — LORAZEPAM 1 MG PO TABS: 0.0000 mg | ORAL_TABLET | Freq: Four times a day (QID) | ORAL | Status: DC

## 2017-10-21 MED ORDER — OXYCODONE HCL 5 MG PO TABS: 5.0000 mg | ORAL_TABLET | ORAL | 0 refills | Status: AC | PRN

## 2017-10-21 MED ORDER — VITAMIN B-1 100 MG PO TABS
100.0000 mg | ORAL_TABLET | Freq: Every day | ORAL | Status: DC
  Filled 2017-10-21: qty 1

## 2017-10-21 MED ORDER — THIAMINE HCL 100 MG/ML IJ SOLN
100.0000 mg | Freq: Every day | INTRAMUSCULAR | Status: DC
  Administered 2017-10-21: 100 mg via INTRAVENOUS
  Filled 2017-10-21: qty 2

## 2017-10-21 MED ORDER — HYDROMORPHONE HCL 1 MG/ML IJ SOLN
1.0000 mg | Freq: Once | INTRAMUSCULAR | Status: AC
  Administered 2017-10-21: 1 mg via INTRAVENOUS
  Filled 2017-10-21: qty 1

## 2017-10-21 MED ORDER — THIAMINE HCL 100 MG/ML IJ SOLN
Freq: Once | INTRAVENOUS | Status: AC
  Administered 2017-10-21: 22:00:00 via INTRAVENOUS
  Filled 2017-10-21: qty 1000

## 2017-10-21 MED ORDER — ONDANSETRON HCL 4 MG/2ML IJ SOLN
4.0000 mg | Freq: Once | INTRAMUSCULAR | Status: AC
  Administered 2017-10-21: 4 mg via INTRAVENOUS
  Filled 2017-10-21: qty 2

## 2017-10-21 MED ORDER — LORAZEPAM 2 MG/ML IJ SOLN: 0.0000 mg | Freq: Two times a day (BID) | INTRAMUSCULAR | Status: DC

## 2017-10-21 MED ORDER — LORAZEPAM 2 MG/ML IJ SOLN
1.0000 mg | Freq: Once | INTRAMUSCULAR | Status: AC
  Administered 2017-10-21: 1 mg via INTRAVENOUS

## 2017-10-21 NOTE — ED Notes (Signed)
Ortho tech paged for arm splint and sling

## 2017-10-21 NOTE — ED Notes (Signed)
Spoke to wife about pt getting discharged and needing a ride home

## 2017-10-21 NOTE — Telephone Encounter (Signed)
Anderson Malta with [facility] has radiologist on phone who wants to speak with Glenda Chroman FNP. Glenda Chroman FNP took call.

## 2017-10-21 NOTE — Telephone Encounter (Signed)
Report received from radiologist.

## 2017-10-21 NOTE — ED Triage Notes (Signed)
Pt arrives to ED from urgent care  with complaints of left elbow pain after falling while walking to his mailbox after drinking a bottle of chardonnay since yesterday. EMS reports pt was sent here because his elbow is broken in multiple places. Pt states he did not come yesterday because he thought it would get better and it has not, pt states he is a binge drinker, last drink yesterday at noon. Pt placed in position of comfort with bed locked and lowered, call bell in reach.

## 2017-10-21 NOTE — ED Notes (Signed)
Delay in patient's infusion d/t loss of IV access. Unable to re establish. IV team paged

## 2017-10-21 NOTE — ED Notes (Signed)
Ortho tech at bedside 

## 2017-10-21 NOTE — ED Notes (Signed)
Patient transported to CT 

## 2017-10-21 NOTE — ED Notes (Signed)
Per PA request, this RN rechecked CIWA. PA requested meds

## 2017-10-21 NOTE — ED Notes (Signed)
Any questions, call wife Burman Nieves (home with knee replacement) @ [phone]. [facility] did his hip 2018 and [facility] did his shoulder (year?)

## 2017-10-21 NOTE — Progress Notes (Signed)
Subjective:    Patient ID: [name], male    DOB: 22-Nov-1945, 72 y.o.   MRN: [id number]  HPI This is a 72 yo male, accompanied by his son, who presents today following a fall. He was walking to the mailbox and fell off the curb. Doesn't think he hit his head. Fell onto left arm. Having severe pain. Denies headache.  "I fell off the wagon." Drinking a couple of bottles of wine a day.  Spoke with son who states he doesn't talk to his father, he is just there some during the day. Patient's wife has been staying with her sister following surgery. Patient home alone most of the time. According to patient's son, [name] obtains alcohol from his son or neighbors or until recently was driving to the grocery store which is very close to their home.   Past Medical History:  Diagnosis Date  . Adenomatous polyp of colon 2006  . Anemia 2012  . Anxiety   . Arthritis    "left arm/shoulder" (05/17/2016)  . Atrial tachycardia, paroxysmal ([place]) 04/07/2015  . BPH (benign prostatic hypertrophy) 5/99  . Chronic bronchitis ([place])    "q year or q other year" (11/02/2011)  . Chronic nausea    "a few days/week" (05/17/2016)  . Colon polyp   . DCM (dilated cardiomyopathy) ([place])    EF 45-50% by echo and cath 2017  . Depression    Hx of  . Falls frequently    "daily lately; legs just give out" (11/02/2011; 05/17/2016)  . GERD (gastroesophageal reflux disease) 07/31/04   gastritis   . H/O hiatal hernia   . Hard of hearing   . Heart murmur   . Hernia, umbilical    "unrepaired" (11/02/2011; 05/17/2016)  . History of blood transfusion 2012  . HLD (hyperlipidemia) 5/99  . HTN (hypertension) 5/99  . Memory loss    "recently has been forgetting alot; maybe related to the alcohol" (11/02/2011; 05/17/2016)  . Mild CAD    10% RCA  . PAC (premature atrial contraction) 04/07/2015  . Peripheral vascular disease ([place])   . Pneumonia 1996   hosp  . Pollen allergy    pollen/ragweed  . Seasonal allergies    Past  Surgical History:  Procedure Laterality Date  . BACK SURGERY    . CARDIAC CATHETERIZATION N/A 05/23/2015   Procedure: Left Heart Cath and Coronary Angiography;  Surgeon: Troy Sine, MD;  Location: [facility];  Service: Cardiovascular;  Laterality: N/A;  . CATARACT EXTRACTION W/ INTRAOCULAR LENS  IMPLANT, BILATERAL Bilateral 10/2002  . COLONOSCOPY  07/31/04   multiple polyps; repeat in 3 years  . DUPUYTREN CONTRACTURE RELEASE Left   . ETT myoview  03/09/07   nml EF 66%  . FRACTURE SURGERY    . HEMORRHOID SURGERY     "cauterized a long time ago" (11/02/2011)  . hosp CP R/O'D 2/24  03/08/07  . neck MRI  6/04   C5-6 disc abnormality  . ORIF HUMERUS FRACTURE  11/04/2011   Procedure: OPEN REDUCTION INTERNAL FIXATION (ORIF) PROXIMAL HUMERUS FRACTURE;  Surgeon: Rozanna Box, MD;  Location: [facility];  Service: Orthopedics;  Laterality: Left;  . POSTERIOR CERVICAL FUSION/FORAMINOTOMY N/A 03/21/2012   Procedure: POSTERIOR CERVICAL FUSION/FORAMINOTOMY LEVEL ONE;  Surgeon: Charlie Pitter, MD;  Location: [facility];  Service: Neurosurgery;  Laterality: N/A;  POSTERIOR CERVICAL ONE TO CERVICAL TWO FUSION WITH ILIAC CREST GRAFT AND LATERAL MASS SCREWS   . TONSILLECTOMY     "  I was young" (11/02/2011)  . TOTAL HIP ARTHROPLASTY Left 10/24/2016   Procedure: TOTAL HIP ARTHROPLASTY ANTERIOR APPROACH;  Surgeon: Rod Can, MD;  Location: [facility];  Service: Orthopedics;  Laterality: Left;  . UPPER GASTROINTESTINAL ENDOSCOPY     Family History  Problem Relation Age of Onset  . Heart disease Father   . Diabetes Father   . Stroke Father   . Depression Mother   . Heart disease Brother   . Alcohol abuse Brother   . Liver disease Brother    Social History   Tobacco Use  . Smoking status: Former Smoker    Types: Pipe  . Smokeless tobacco: Never Used  . Tobacco comment: 05/17/2016 "stopped in the late 1990s"  Substance Use Topics  . Alcohol use: Yes    Alcohol/week: 2.0 - 3.0 standard drinks     Types: 2 - 3 Glasses of wine per week    Comment: 11/02/2011 "large box of wine q 2 days" 01-27-15 2 glass a day of wine; 05/17/2016 "1.75L bottles; 1- 1 1/2 bottles/day; somedays worse than others; at least 3-4 days/week he'll have 1 1/2 bottles; last drink was 05/16/2016" . 2-3 glasses wine daily (06/29/17)  . Drug use: No      Review of Systems   per HPI Objective:   Physical Exam  Constitutional: He is oriented to person, place, and time. He appears well-developed and well-nourished.  Sitting in wheelchair. Grimacing intermittently.   HENT:  Head: Normocephalic and atraumatic.  Eyes: Conjunctivae are normal.  Cardiovascular: Tachycardia present.  Pulmonary/Chest: Effort normal.  Musculoskeletal:  Left lateral forearm with extensive bruising and swelling. Proximal lateral forearm with skin tear. No drainage. Decreased ROM of elbow. Pain with movement.   Neurological: He is alert and oriented to person, place, and time.  Skin: Skin is warm and dry.  Psychiatric:  Patient mildly agitated, repeated requesting pain medication despite being told we do not have in the offic.   Vitals reviewed.     BP 140/80 (BP Location: Right Arm, Patient Position: Sitting, Cuff Size: Normal)   Pulse (!) 117   Temp 98.2 F (36.8 C) (Oral)   Ht 5\' 9"  (1.753 m)   Wt 189 lb (85.7 kg)   SpO2 96%   BMI 27.91 kg/m  Wt Readings from Last 3 Encounters:  10/21/17 189 lb (85.7 kg)  10/03/17 191 lb (86.6 kg)  09/06/17 187 lb 8 oz (85 kg)       Assessment & Plan:  1. Fall, initial encounter - DG Forearm Left; Future - DG Elbow Complete Left; Future  2. Pain and swelling of left forearm - DG Forearm Left; Future - DG Elbow Complete Left; Future  3. Pain and swelling of left elbow - DG Elbow Complete Left; Future  4. Closed fracture of olecranon process of left ulna, initial encounter - report received from radiology, patient with olecranon fracture - advised patient that he needs to go to ER,  he asks for EMS transport  5. Chest pain, unspecified type - as patient was getting prepared for non emergent transport, he reported that he had been having chest pain through the day, his vital signs are stable   6. ETOH abuse - have discussed consequences of continued alcohol abuse with patient on multiple occasions. He does not wish to attend any treatment.  - ? Unsafe living environment, he has had multiple falls recently  - patient was transported via EMS to [place], FNP-BC  [facility], [place]  10/21/2017 4:39 PM

## 2017-10-21 NOTE — Patient Instructions (Signed)
You have an acute fracture of your elbow, it is important that you go to the emergency room for treatment

## 2017-10-21 NOTE — ED Notes (Signed)
EMT placed on 2L via [place] d/t sats dropping into the 80s.

## 2017-10-21 NOTE — Discharge Instructions (Addendum)
Follow-up with orthopedics, call on Monday for an appointment on Monday. At home it is important to elevate your elbow above the level of your heart and apply ice on top of the splint for 20 minutes at a time to help reduce pain and swelling. Take oxycodone as needed as prescribed for severe pain.   Return to ER for worsening or concerning symptoms.

## 2017-10-21 NOTE — ED Notes (Signed)
Patient transported to X-ray 

## 2017-10-21 NOTE — ED Provider Notes (Signed)
[facility] EMERGENCY DEPARTMENT Provider Note   CSN: [id number] Arrival date & time: 10/21/17  1731     History   Chief Complaint Chief Complaint  Patient presents with  . Elbow Injury    HPI [name] is a 72 y.o. male.  72 year old male presents with injuries from a fall.  Patient states that he drank a bottle of wine yesterday and then fell to his left side injuring his left elbow.  Patient thought that his elbow would get better if he just give it time however I will continue to hurt today which prompted his visit to urgent care.  Patient had an x-ray at urgent care showing a displaced olecranon fracture and was sent to the emergency room.  Patient denies hitting his head or LOC at the time of the fall.  Reports pain in his left leg while trying to walk today, specifically in his left hip and left ankle.  Also reports pain in his left wrist.  Patient reports daily heavy alcohol use, states he has not had any alcohol since yesterday.  No other injuries, complaints, concerns.     Past Medical History:  Diagnosis Date  . Adenomatous polyp of colon 2006  . Anemia 2012  . Anxiety   . Arthritis    "left arm/shoulder" (05/17/2016)  . Atrial tachycardia, paroxysmal ([place]) 04/07/2015  . BPH (benign prostatic hypertrophy) 5/99  . Chronic bronchitis ([place])    "q year or q other year" (11/02/2011)  . Chronic nausea    "a few days/week" (05/17/2016)  . Colon polyp   . DCM (dilated cardiomyopathy) ([place])    EF 45-50% by echo and cath 2017  . Depression    Hx of  . Falls frequently    "daily lately; legs just give out" (11/02/2011; 05/17/2016)  . GERD (gastroesophageal reflux disease) 07/31/04   gastritis   . H/O hiatal hernia   . Hard of hearing   . Heart murmur   . Hernia, umbilical    "unrepaired" (11/02/2011; 05/17/2016)  . History of blood transfusion 2012  . HLD (hyperlipidemia) 5/99  . HTN (hypertension) 5/99  . Memory loss    "recently has been  forgetting alot; maybe related to the alcohol" (11/02/2011; 05/17/2016)  . Mild CAD    10% RCA  . PAC (premature atrial contraction) 04/07/2015  . Peripheral vascular disease ([place])   . Pneumonia 1996   hosp  . Pollen allergy    pollen/ragweed  . Seasonal allergies     Patient Active Problem List   Diagnosis Date Noted  . Acute streptococcal pharyngitis 09/06/2017  . Frequent falls 06/29/2017  . Fall 04/09/2017  . Scalp laceration   . Hypomagnesemia 10/25/2016  . Hip fracture ([place]) 10/24/2016  . Displaced fracture of left femoral neck ([place]) 10/24/2016  . Thrombocytopenia ([place]) 10/24/2016  . Closed displaced fracture of left femoral neck ([place]) 10/23/2016  . Contracture of muscle of left hand 09/15/2016  . Infected laceration 07/08/2016  . Generalized muscle weakness 07/08/2016  . Anemia 05/18/2016  . Syncope and collapse   . Syncope 05/17/2016  . Cellulitis 05/17/2016  . Rib fractures 05/17/2016  . Bullous pemphigoid 05/17/2016  . ETOH abuse 03/17/2016  . Dizziness and giddiness 12/09/2015  . Chronic pain syndrome 12/09/2015  . Arthritis 11/20/2015  . Mild CAD   . DCM (dilated cardiomyopathy) ([place])   . Abnormal nuclear stress test 05/07/2015  . PAC (premature atrial contraction) 04/07/2015  . Atrial tachycardia, paroxysmal ([place]) 03/10/2015  .  Depression 04/12/2012  . C2 cervical fracture ([place]) 02/29/2012  . Alcohol withdrawal ([place]) 11/06/2011  . Fracture of humerus, proximal, left, closed 11/02/2011  . Hyponatremia 11/17/2010  . H/O: upper GI bleed 10/20/2010  . Stomach ulcer from aspirin/ibuprofen-like drugs (NSAID's) 10/20/2010  . Colon polyp 08/26/2010  . Diverticulosis of colon (without mention of hemorrhage) 08/26/2010  . BPH (benign prostatic hyperplasia) 07/02/2010  . Deficiency anemia 03/09/2010  . Hyposmolality and/or hyponatremia 10/17/2009  . ALCOHOLIC CIRRHOSIS OF LIVER 07/25/2009  . Hypokalemia 04/03/2007  . Anxiety state 01/24/2007  . HLD  (hyperlipidemia) 07/28/2006  . Alcohol abuse 07/28/2006  . Essential hypertension 07/28/2006  . IMPOTENCE, ORGANIC ORIGIN 07/28/2006    Past Surgical History:  Procedure Laterality Date  . BACK SURGERY    . CARDIAC CATHETERIZATION N/A 05/23/2015   Procedure: Left Heart Cath and Coronary Angiography;  Surgeon: Troy Sine, MD;  Location: [facility];  Service: Cardiovascular;  Laterality: N/A;  . CATARACT EXTRACTION W/ INTRAOCULAR LENS  IMPLANT, BILATERAL Bilateral 10/2002  . COLONOSCOPY  07/31/04   multiple polyps; repeat in 3 years  . DUPUYTREN CONTRACTURE RELEASE Left   . ETT myoview  03/09/07   nml EF 66%  . FRACTURE SURGERY    . HEMORRHOID SURGERY     "cauterized a long time ago" (11/02/2011)  . hosp CP R/O'D 2/24  03/08/07  . neck MRI  6/04   C5-6 disc abnormality  . ORIF HUMERUS FRACTURE  11/04/2011   Procedure: OPEN REDUCTION INTERNAL FIXATION (ORIF) PROXIMAL HUMERUS FRACTURE;  Surgeon: Rozanna Box, MD;  Location: [facility];  Service: Orthopedics;  Laterality: Left;  . POSTERIOR CERVICAL FUSION/FORAMINOTOMY N/A 03/21/2012   Procedure: POSTERIOR CERVICAL FUSION/FORAMINOTOMY LEVEL ONE;  Surgeon: Charlie Pitter, MD;  Location: [facility];  Service: Neurosurgery;  Laterality: N/A;  POSTERIOR CERVICAL ONE TO CERVICAL TWO FUSION WITH ILIAC CREST GRAFT AND LATERAL MASS SCREWS   . TONSILLECTOMY     "I was young" (11/02/2011)  . TOTAL HIP ARTHROPLASTY Left 10/24/2016   Procedure: TOTAL HIP ARTHROPLASTY ANTERIOR APPROACH;  Surgeon: Rod Can, MD;  Location: [facility];  Service: Orthopedics;  Laterality: Left;  . UPPER GASTROINTESTINAL ENDOSCOPY          Home Medications    Prior to Admission medications   Medication Sig Start Date End Date Taking? Authorizing Provider  cetirizine (ZYRTEC) 10 MG tablet Take 1 tablet (10 mg total) by mouth daily. 10/03/17   Elby Beck, FNP  Cyanocobalamin (VITAMIN B-12 PO) Take 1 tablet by mouth daily.    [provider]    cyclobenzaprine (FLEXERIL) 10 MG tablet Take 0.5-1 tablets (5-10 mg total) by mouth at bedtime as needed for muscle spasms. 10/03/17   Elby Beck, FNP  DULoxetine (CYMBALTA) 30 MG capsule Take 1 capsule (30 mg total) by mouth daily. 10/03/17   Elby Beck, FNP  ferrous sulfate 325 (65 FE) MG tablet Take 325 mg by mouth daily with breakfast.    [provider]  fluticasone (FLONASE) 50 MCG/ACT nasal spray Place 1 spray into both nostrils 2 (two) times daily as needed for allergies. 06/22/17   Elby Beck, FNP  folic acid (FOLVITE) 1 MG tablet Take 1 tablet (1 mg total) by mouth daily. 10/31/16   Isaac Bliss, Rayford Halsted, MD  gabapentin (NEURONTIN) 100 MG capsule TAKE 2 CAPSULES TWICE A DAY 10/17/17   Elby Beck, FNP  ibuprofen (ADVIL,MOTRIN) 400 MG tablet Take 1 tablet (400 mg total)  by mouth every 6 (six) hours as needed for moderate pain (neck pain). 04/11/17   Shelly Coss, MD  KLOR-CON M15 15 MEQ tablet  07/08/17   [provider]  lidocaine (LIDODERM) 5 % Place 1 patch onto the skin daily. Remove & Discard patch within 12 hours or as directed by MD Patient taking differently: Place 1 patch onto the skin daily as needed (pain). Remove & Discard patch within 12 hours or as directed by MD 05/11/16   Lucille Passy, MD  magnesium oxide (MAG-OX) 400 (241.3 Mg) MG tablet Take 1 tablet (400 mg total) by mouth daily. 04/12/17   Shelly Coss, MD  metoprolol succinate (TOPROL-XL) 25 MG 24 hr tablet Take 0.5 tablets (12.5 mg total) by mouth daily. 08/25/17   Copland, Frederico Hamman, MD  Multiple Vitamin (MULTIVITAMIN WITH MINERALS) TABS tablet Take 1 tablet by mouth daily. 10/31/16   Isaac Bliss, Rayford Halsted, MD  oxyCODONE (ROXICODONE) 5 MG immediate release tablet Take 1 tablet (5 mg total) by mouth every 4 (four) hours as needed for up to 3 days for severe pain. 10/21/17 10/24/17  Tacy Learn, PA-C  pantoprazole (PROTONIX) 40 MG tablet Take 1 tablet (40 mg total)  by mouth daily. 08/25/17   Copland, Frederico Hamman, MD  potassium chloride SA (K-DUR,KLOR-CON) 20 MEQ tablet Take 1 tablet (20 mEq total) by mouth daily. 04/11/17 05/11/17  Shelly Coss, MD  Thiamine HCl (VITAMIN B-1) 100 MG tablet Take 100 mg by mouth daily. Reported on 02/17/2015    [provider]  TRAZODONE HCL PO Take by mouth at bedtime.    [provider]    Family History Family History  Problem Relation Age of Onset  . Heart disease Father   . Diabetes Father   . Stroke Father   . Depression Mother   . Heart disease Brother   . Alcohol abuse Brother   . Liver disease Brother     Social History Social History   Tobacco Use  . Smoking status: Former Smoker    Types: Pipe  . Smokeless tobacco: Never Used  . Tobacco comment: 05/17/2016 "stopped in the late 1990s"  Substance Use Topics  . Alcohol use: Yes    Alcohol/week: 28.0 standard drinks    Types: 28 Glasses of wine per week    Comment: "I don't always drink, but when I do I drink a lot and it's always wine. about a bottle per day."  . Drug use: No     Allergies   Nifedipine   Review of Systems Review of Systems  Constitutional: Negative for fever.  Gastrointestinal: Negative for abdominal pain, nausea and vomiting.  Musculoskeletal: Positive for arthralgias, gait problem, joint swelling and myalgias. Negative for back pain and neck pain.  Skin: Positive for wound.  Neurological: Negative for weakness.  Psychiatric/Behavioral: Negative for confusion.  All other systems reviewed and are negative.    Physical Exam Updated Vital Signs BP (!) 154/89 (BP Location: Right Arm)   Pulse (!) 110   Temp 98.7 F (37.1 C) (Oral)   Resp 18   Ht 5\' 9"  (1.753 m)   Wt 85.7 kg   SpO2 95%   BMI 27.91 kg/m   Physical Exam  Constitutional: He is oriented to person, place, and time. He appears well-developed and well-nourished. No distress.  HENT:  Head: Normocephalic and atraumatic.  Cardiovascular:  Regular rhythm, normal heart sounds and intact distal pulses. Tachycardia present.  Pulmonary/Chest: Effort normal and breath sounds normal. No respiratory  distress.  Abdominal: Soft. There is no tenderness.  Musculoskeletal: He exhibits tenderness. He exhibits no deformity.       Right shoulder: He exhibits no tenderness and no bony tenderness.       Left shoulder: He exhibits no tenderness and no bony tenderness.       Left elbow: He exhibits decreased range of motion and swelling. He exhibits no laceration. Tenderness found. Olecranon process tenderness noted.       Left wrist: He exhibits bony tenderness. He exhibits no swelling, no effusion, no crepitus, no deformity and no laceration.       Right hip: Normal.       Left hip: He exhibits decreased range of motion and tenderness.       Left knee: No tenderness found.       Left ankle: He exhibits decreased range of motion and swelling. He exhibits no ecchymosis, no deformity, no laceration and normal pulse. Tenderness. Lateral malleolus and medial malleolus tenderness found. No head of 5th metatarsal and no proximal fibula tenderness found.       Cervical back: He exhibits no tenderness and no bony tenderness.       Thoracic back: He exhibits no tenderness and no bony tenderness.       Lumbar back: He exhibits no tenderness and no bony tenderness.  Neurological: He is alert and oriented to person, place, and time. No sensory deficit. GCS eye subscore is 4. GCS verbal subscore is 5. GCS motor subscore is 6.  Skin: Skin is warm and dry. He is not diaphoretic.  Psychiatric: He has a normal mood and affect. His behavior is normal.  Nursing note and vitals reviewed.    ED Treatments / Results  Labs (all labs ordered are listed, but only abnormal results are displayed) Labs Reviewed  COMPREHENSIVE METABOLIC PANEL - Abnormal; Notable for the following components:      Result Value   Sodium 129 (*)    Chloride 89 (*)    CO2 20 (*)    BUN  <5 (*)    Calcium 8.4 (*)    AST 72 (*)    Anion gap 20 (*)    All other components within normal limits  CBC WITH DIFFERENTIAL/PLATELET - Abnormal; Notable for the following components:   RBC 4.19 (*)    Hemoglobin 11.7 (*)    HCT 35.3 (*)    Platelets 83 (*)    Lymphs Abs 0.5 (*)    All other components within normal limits  ETHANOL - Abnormal; Notable for the following components:   Alcohol, Ethyl (B) 189 (*)    All other components within normal limits  CBG MONITORING, ED - Abnormal; Notable for the following components:   Glucose-Capillary 103 (*)    All other components within normal limits    EKG None  Radiology Dg Elbow Complete Left  Result Date: 10/21/2017 CLINICAL DATA:  Bruising and swelling EXAM: LEFT ELBOW - COMPLETE 3+ VIEW COMPARISON:  None. FINDINGS: Acute olecranon fracture extending into the elbow joint is identified with at least 1.4 cm of distraction of the fracture fragments. The radial head appears intact the osteopenic limiting assessment. No joint dislocation. Positive elbow joint effusion. Vascular calcifications are identified along the course of the brachial, radial and ulnar arteries. Posttraumatic soft tissue swelling of the included elbow and forearm. IMPRESSION: Acute intra-articular fracture through the olecranon with extension into the elbow joint and resultant positive joint effusion. 1.4 cm of distraction of fracture fragments  is identified on the lateral view. These results were called by telephone at the time of interpretation on 10/21/2017 at 3:53 pm to [place] , who verbally acknowledged these results. Soft tissue swelling of the included elbow and forearm. Electronically Signed   By: Ashley Royalty M.D.   On: 10/21/2017 15:53   Dg Forearm Left  Result Date: 10/21/2017 CLINICAL DATA:  Bruising and swelling with decreased range of motion following fall. EXAM: LEFT FOREARM - 2 VIEW COMPARISON:  None. FINDINGS: Acute olecranon fracture  extending into the elbow joint is identified with at least 1.4 cm of distraction of the fracture fragments. The radial head appears intact the osteopenic limiting assessment. No joint dislocation. Positive elbow joint effusion. Vascular calcifications are identified along the course of the brachial, radial and ulnar arteries. IMPRESSION: Acute intra-articular fracture through the olecranon with extension into the elbow joint and resultant positive joint effusion. 1.4 cm of distraction of fracture fragments is identified on the lateral view. These results were called by telephone at the time of interpretation on 10/21/2017 at 3:54 pm to [place] , who verbally acknowledged these results. Generalized soft tissue swelling posttraumatic in etiology of the elbow and forearm. Electronically Signed   By: Ashley Royalty M.D.   On: 10/21/2017 15:54   Dg Wrist Complete Left  Result Date: 10/21/2017 CLINICAL DATA:  Initial evaluation for acute trauma, fall. EXAM: LEFT WRIST - COMPLETE 3+ VIEW COMPARISON:  Prior radiograph from 08/05/2017 FINDINGS: No acute fracture dislocation. Osteoarthritic changes present about the wrist. Diffuse osteopenia. No acute soft tissue abnormality. Prominent vascular calcifications noted. IMPRESSION: No acute osseous abnormality about the wrist. Electronically Signed   By: Jeannine Boga M.D.   On: 10/21/2017 19:22   Dg Ankle Complete Left  Result Date: 10/21/2017 CLINICAL DATA:  Initial evaluation for acute trauma, fall. EXAM: LEFT ANKLE COMPLETE - 3+ VIEW COMPARISON:  None. FINDINGS: No acute fracture or dislocation. Ankle mortise approximated. Talar dome intact. Degenerative osteoarthritic changes present about the ankle. Posterior and plantar calcaneal enthesophytes present. No acute soft tissue abnormality. Diffuse osteopenia. Prominent vascular calcifications within the visualized leg and foot. IMPRESSION: No acute osseous abnormality about the left ankle.  Electronically Signed   By: Jeannine Boga M.D.   On: 10/21/2017 19:17   Ct Cervical Spine Wo Contrast  Result Date: 10/21/2017 CLINICAL DATA:  Fall, severe pain. EXAM: CT CERVICAL SPINE WITHOUT CONTRAST TECHNIQUE: Multidetector CT imaging of the cervical spine was performed without intravenous contrast. Multiplanar CT image reconstructions were also generated. COMPARISON:  CT cervical spine dated 04/07/2017. FINDINGS: Alignment: Stable scoliosis. No evidence of acute vertebral body subluxation. Skull base and vertebrae: No evidence of acute fracture line or displaced fracture fragment. Old fracture within the odontoid is stable in alignment. Associated fixation hardware at the C1 and C2 levels appears intact and stable in alignment. Soft tissues and spinal canal: No prevertebral fluid or swelling. No visible canal hematoma. Disc levels: Mild degenerative spondylitic changes again noted within the mid and lower cervical spine, stable. No more than mild central canal stenosis at any level. Upper chest: No acute findings. Other: Bilateral carotid atherosclerosis. IMPRESSION: 1. No acute fracture or subluxation within the cervical spine. Old fracture within the odontoid is stable in alignment, and the associated fixation hardware at the C1-C2 levels appears intact and stable in alignment. 2. Mild degenerative spondylitic changes within the mid and lower cervical spine, stable. 3. Carotid atherosclerosis. Electronically Signed   By: Roxy Horseman.D.  On: 10/21/2017 18:57   Ct Pelvis Wo Contrast  Result Date: 10/21/2017 CLINICAL DATA:  Fall. History of LEFT hip arthroplasty. Possible periprosthetic acetabular fracture. EXAM: CT PELVIS WITHOUT CONTRAST TECHNIQUE: Multidetector CT imaging of the pelvis was performed following the standard protocol without intravenous contrast. COMPARISON:  None. FINDINGS: LEFT hip arthroplasty hardware appears intact and appropriately positioned. No osseous fracture seen  within the LEFT acetabulum or proximal LEFT femur. No fracture within the adjacent pubic rami or symphysis pubis. No fracture seen within the sacrum. No acute intrapelvic abnormality. Midline lower abdominal wall hernia contains fat only. Soft tissue edema appreciated within the subcutaneous soft tissues overlying the LEFT hip. IMPRESSION: 1. No acute fracture. Specifically, no acute fracture within the periprosthetic acetabulum or proximal LEFT femur. 2. LEFT hip arthroplasty hardware appears intact and appropriately positioned. 3. Ill-defined edema within the subcutaneous soft tissues lateral to the LEFT hip. Electronically Signed   By: Franki Cabot M.D.   On: 10/21/2017 21:02   Dg Hand Complete Left  Result Date: 10/21/2017 CLINICAL DATA:  Initial evaluation for acute trauma, fall. EXAM: LEFT HAND - COMPLETE 3+ VIEW COMPARISON:  Prior radiograph from 08/05/2017. FINDINGS: No acute fracture dislocation. Prominent osteoarthritic changes present throughout the hand, most notable at the first [facility] joint. Diffuse osteopenia. No acute soft tissue abnormality. Prominent vascular calcifications at the wrist and hand. IMPRESSION: 1. No acute osseous abnormality identified. 2. Prominent osteoarthritic changes throughout the hand, most notable at the first [facility] joint. Electronically Signed   By: Jeannine Boga M.D.   On: 10/21/2017 19:20   Dg Hip Unilat With Pelvis 2-3 Views Left  Result Date: 10/21/2017 CLINICAL DATA:  Initial evaluation for acute trauma, fall, pain. EXAM: DG HIP (WITH OR WITHOUT PELVIS) 2-3V LEFT COMPARISON:  Prior study from 10/24/2016. FINDINGS: Bones are diffusely osteopenic, somewhat limiting evaluation for possible subtle acute nondisplaced fractures. There is question of a transverse lucency extending from the left acetabulum medially through the iliopectineal line, nonspecific, but could reflect an acute nondisplaced fracture, not entirely certain. No other acute fracture or  dislocation. Bony pelvis otherwise grossly intact. SI joints approximated symmetric. No pubic diastasis. Left total hip arthroplasty in place and appears well seated. No periprosthetic lucency to suggest loosening or failure. No acute soft tissue abnormality. Degenerative changes noted about the right hip and within the lower lumbar spine. Extensive vascular calcifications noted within the pelvis and upper thighs. IMPRESSION: 1. Question linear transverse lucency extending from the left acetabular prosthesis through the iliopectineal line. Finding is age indeterminate, but could reflect a subtle acute nondisplaced fracture. Difficult to be certain given extensive osteopenia. If there is high clinical suspicion for possible fracture, further assessment with cross-sectional imaging may be helpful for further evaluation. 2. No other acute osseous abnormality. 3. Left hip arthroplasty otherwise in place without complication. Electronically Signed   By: Jeannine Boga M.D.   On: 10/21/2017 19:15    Procedures Procedures (including critical care time)  Medications Ordered in ED Medications  LORazepam (ATIVAN) injection 0-4 mg (1 mg Intravenous Given 10/21/17 1914)    Or  LORazepam (ATIVAN) tablet 0-4 mg ( Oral See Alternative 10/21/17 1914)  LORazepam (ATIVAN) injection 0-4 mg (has no administration in time range)    Or  LORazepam (ATIVAN) tablet 0-4 mg (has no administration in time range)  HYDROmorphone (DILAUDID) injection 1 mg (1 mg Intravenous Given 10/21/17 1915)  ondansetron (ZOFRAN) injection 4 mg (4 mg Intravenous Given 10/21/17 1914)  sodium chloride 0.9 % 1,000  mL with thiamine 409 mg, folic acid 1 mg, multivitamins adult 10 mL infusion ( Intravenous Stopped 10/22/17 0013)  ondansetron (ZOFRAN) injection 4 mg (4 mg Intravenous Given 10/21/17 2301)  LORazepam (ATIVAN) injection 1 mg (1 mg Intravenous Given 10/21/17 2301)     Initial Impression / Assessment and Plan / ED Course  I  have reviewed the triage vital signs and the nursing notes.  Pertinent labs & imaging results that were available during my care of the patient were reviewed by me and considered in my medical decision making (see chart for details).  Clinical Course as of Oct 22 24  Fri Oct 21, 2017  2037 XR left elbow completed at outside imaging center today:  IMPRESSION: Acute intra-articular fracture through the olecranon with extension into the elbow joint and resultant positive joint effusion. 1.4 cm of distraction of fracture fragments is identified on the lateral view. These results were called by telephone at the time of interpretation on 10/21/2017 at 3:53 pm to [place] , who verbally acknowledged these results.  Soft tissue swelling of the included elbow and forearm.   Electronically Signed   By: Ashley Royalty M.D.   On: 10/21/2017 15:53   [LM]  2270 72 year old male presents with injuries from a fall which occurred yesterday.  Patient states that he was intoxicated when he fell to his left side injuring his left elbow, also complains of pain in his left wrist, left hip, left ankle.  Patient was seen at urgent care today, had x-ray of the left elbow showing an acute intra-articular fracture through the olecranon with extension into the elbow joint and resultant positive joint effusion. CT C-spine is negative for acute injury, x-ray of the left wrist as well as left ankle are negative for acute injury.  X-ray of the left hip with concern for possible fracture.  Case was discussed with Dr. Lyla Glassing, orthopedist on call for [facility] also familiar with this patient due to left hip replacement 10/2016. Dr. Lyla Glassing has reviewed patient's x-rays, ordered CT pelvis for further evaluation, request phone call if CT is positive for fracture to discuss weightbearing status, in regards to the hip if the CT is negative the patient may be discharged.  Recommends consult with on-call hand  specialist for patient's elbow fracture. Dr. [place] Silos paged for consult left olecranon fracture.   [LM]  2108 CT pelvis negative for acute left hip fracture.   [LM]  2125 Case discussed with Dr. Marlou Sa, on-call with orthopedics, has reviewed x-rays and recommends long-arm splint with elbow flexed to 90 degrees, extra padding around the elbow.  Have patient follow-up in the office on Monday.   [LM]  2345 Patient feels ready for discharge home, see was score of 5 and was given Ativan and Zofran prior to discharge.  Patient discharged home with prescription for oxycodone as he cannot take Tylenol due to liver disease.  Review of controlled substance database shows no recent prescriptions.  Patient understands he is to follow-up with orthopedics, call the office on Monday for an appointment.   [LM]    Clinical Course User Index [LM] Tacy Learn, PA-C   Final Clinical Impressions(s) / ED Diagnoses   Final diagnoses:  Closed fracture of olecranon process of left ulna, initial encounter  Left hip pain  Acute left ankle pain  Fall, initial encounter  Alcoholic intoxication without complication [facility])    ED Discharge Orders         Ordered  oxyCODONE (ROXICODONE) 5 MG immediate release tablet  Every 4 hours PRN     10/21/17 2201           Tacy Learn, PA-C 10/22/17 0027    Lennice Sites, DO 10/22/17 [phone]

## 2017-10-22 DIAGNOSIS — S52022A Displaced fracture of olecranon process without intraarticular extension of left ulna, initial encounter for closed fracture: Secondary | ICD-10-CM | POA: Diagnosis not present

## 2017-10-22 NOTE — Progress Notes (Signed)
Orthopedic Tech Progress Note Patient Details:  [name] [birth year] [id number]  Ortho Devices Type of Ortho Device: Arm sling, Post (long arm) splint Ortho Device/Splint Location: lue Ortho Device/Splint Interventions: Ordered, Application, Adjustment   Post Interventions Patient Tolerated: Well Instructions Provided: Care of device, Adjustment of device   [name] 10/22/2017, 1:06 AM

## 2017-10-24 ENCOUNTER — Encounter: Payer: Self-pay | Admitting: Family Medicine

## 2017-10-24 ENCOUNTER — Telehealth: Payer: Self-pay

## 2017-10-24 DIAGNOSIS — S52025A Nondisplaced fracture of olecranon process without intraarticular extension of left ulna, initial encounter for closed fracture: Secondary | ICD-10-CM

## 2017-10-24 DIAGNOSIS — K7030 Alcoholic cirrhosis of liver without ascites: Secondary | ICD-10-CM

## 2017-10-24 DIAGNOSIS — M6281 Muscle weakness (generalized): Secondary | ICD-10-CM

## 2017-10-24 DIAGNOSIS — R55 Syncope and collapse: Secondary | ICD-10-CM

## 2017-10-24 DIAGNOSIS — R296 Repeated falls: Secondary | ICD-10-CM

## 2017-10-24 DIAGNOSIS — S52022A Displaced fracture of olecranon process without intraarticular extension of left ulna, initial encounter for closed fracture: Secondary | ICD-10-CM

## 2017-10-24 NOTE — Telephone Encounter (Signed)
PLEASE NOTE: All timestamps contained within this report are represented as [place] Standard Time. CONFIDENTIALTY NOTICE: This fax transmission is intended only for the addressee. It contains information that is legally privileged, confidential or otherwise protected from use or disclosure. If you are not the intended recipient, you are strictly prohibited from reviewing, disclosing, copying using or disseminating any of this information or taking any action in reliance on or regarding this information. If you have received this fax in error, please notify us immediately by telephone so that we can arrange for its return to [place]. Phone: [phone], Toll-Free: [phone], Fax: [phone] Page: 1 of 2 Call Id: [id number] [place] Patient Name: [name] Gender: Male DOB: 1945-08-24 Age: 72 Y 2 M 10 D Return Phone Number: [phone] (Primary) [place] [address] Client [facility] Night - Client Client Site [place] Physician Tor Netters- NP Contact Type Call Who Is Calling Patient / Member / Family / Caregiver Call Type Triage / Clinical Caller Name Racheal Patches Relationship To Patient Spouse Return Phone Number [phone] (Primary) Chief Complaint Weakness, Generalized Reason for Call Symptomatic / Request for [place] states her husband fell fracturing his elbow, went to the ER and was release this morning. Currently at home now, very weak and is not able to take care of himself. Requesting Home Healthcare ASAP. Translation No No Triage Reason Patient declined Nurse Assessment Nurse: Jimmye Norman, RN, [place] Date/Time (Eastern Time): 10/22/2017 10:37:46 AM Confirm and document reason for call. If symptomatic, describe symptoms. ---Caller states her husband fell  fracturing his elbow, went to the ER and was released this morning. Currently at home now, very weak and is not able to take care of himself. Requesting Home Healthcare ASAP. States he was seen yesterday by Jackelyn Poling at [facility], was sent to the ED to make sure there was nothing more serious. Does the patient have any new or worsening symptoms? ---Yes Will a triage be completed? ---No Select reason for no triage. ---Patient declined Please document clinical information provided and list any resource used. ---Caller does not wish for RN to speak with patient at this time. States that she will call him and talk with him and if needed she will call back and have the nurse call him directly and assess his symptoms. RN instructed that at this time the office is closed and home health care can not be implemented now, but if he is unable to take care of himself and there is no one that can be in the home to take care of him he will need to go back in to the ED for evaluation. He may be able to go to the Saturday clinic, but RN would like to triage patient to see what is appropriate. Caller verbalized understanding and states she will call him and see what she thinks and call back if needed. PLEASE NOTE: All timestamps contained within this report are represented as [place] Standard Time. CONFIDENTIALTY NOTICE: This fax transmission is intended only for the addressee. It contains information that is legally privileged, confidential or otherwise protected from use or disclosure. If you are not the intended recipient, you are strictly prohibited from reviewing, disclosing, copying using or disseminating any of this information or taking any action in reliance on or regarding this information. If you have received this fax in error, please notify us  immediately by telephone so that we can arrange for its return to [place]. Phone: [phone], Toll-Free: [phone], Fax: [phone] Page: 2 of 2 Call  Id: [id number] Guidelines Guideline Title Affirmed Question Affirmed Notes Nurse Date/Time Eilene Ghazi Time) Disp. Time Eilene Ghazi Time) Disposition Final User 10/22/2017 10:53:41 AM Clinical Call Yes Jimmye Norman, RN, Loree Fee

## 2017-10-24 NOTE — Telephone Encounter (Signed)
[facility] as requested.  Thanks.

## 2017-10-24 NOTE — Telephone Encounter (Signed)
Can you please help me see if we can get home health in? He broke his elbow last week. Has orthopedic follow up today. Concerned for home safety with recurrent falls and possibly unsafe living conditions. Had home PT recently.

## 2017-10-25 ENCOUNTER — Telehealth: Payer: Self-pay

## 2017-10-25 NOTE — Telephone Encounter (Signed)
I sent her a mychart message that she can cancel the appointment. He needs ortho follow up.

## 2017-10-25 NOTE — Telephone Encounter (Signed)
Per appt note on 10/26/17 @ 9:00 the appt is for ED FU.Please advise. Pt also has appt scheduled as new pt with orthopedics on 10/16/219 at 3:15. Please advise.

## 2017-10-25 NOTE — Telephone Encounter (Signed)
Copied from [place] [phone] [id number]. Topic: General - Other >> Oct 25, 2017  9:57 AM Janace Aris A wrote: Reason for CRM: Patient's wife called in wanting to know does she need to keep the appointment for the Pt to see Dr. Jerl Santos for the authorization of the home health. Or should she cancel it, she says she is not sure if it's required or not.   Please advise

## 2017-10-25 NOTE — Telephone Encounter (Signed)
Copied from [place] [id number]. Topic: General - Other >> Oct 25, 2017  9:57 AM Janace Aris A wrote: Reason for CRM: Patient's wife called in wanting to know does she need to keep the appointment for the Pt to see Dr. Jerl Santos for the authorization of the home health. Or should she cancel it, she says she is not sure if it's required or not.   Please advise

## 2017-10-26 ENCOUNTER — Other Ambulatory Visit: Payer: Self-pay

## 2017-10-26 ENCOUNTER — Encounter (HOSPITAL_COMMUNITY): Payer: Self-pay | Admitting: *Deleted

## 2017-10-26 ENCOUNTER — Ambulatory Visit: Payer: Medicare Other | Admitting: Family Medicine

## 2017-10-26 ENCOUNTER — Other Ambulatory Visit: Payer: Self-pay | Admitting: Family Medicine

## 2017-10-26 ENCOUNTER — Ambulatory Visit (INDEPENDENT_AMBULATORY_CARE_PROVIDER_SITE_OTHER): Payer: Medicare Other | Admitting: Orthopaedic Surgery

## 2017-10-26 ENCOUNTER — Encounter (INDEPENDENT_AMBULATORY_CARE_PROVIDER_SITE_OTHER): Payer: Self-pay | Admitting: Orthopedic Surgery

## 2017-10-26 DIAGNOSIS — F10930 Alcohol use, unspecified with withdrawal, uncomplicated: Secondary | ICD-10-CM

## 2017-10-26 DIAGNOSIS — S52022A Displaced fracture of olecranon process without intraarticular extension of left ulna, initial encounter for closed fracture: Secondary | ICD-10-CM | POA: Diagnosis not present

## 2017-10-26 DIAGNOSIS — F10230 Alcohol dependence with withdrawal, uncomplicated: Secondary | ICD-10-CM

## 2017-10-26 MED ORDER — LORAZEPAM 1 MG PO TABS: 1.0000 mg | ORAL_TABLET | Freq: Two times a day (BID) | ORAL | 0 refills | Status: DC | PRN

## 2017-10-26 MED ORDER — OXYCODONE HCL 5 MG PO TABS: 5.0000 mg | ORAL_TABLET | Freq: Two times a day (BID) | ORAL | 0 refills | Status: DC | PRN

## 2017-10-26 NOTE — Progress Notes (Addendum)
Office Visit Note   Patient: [name]           Date of Birth: 02/13/1945           MRN: [id number] Visit Date: 10/26/2017              Requested by: Elby Beck, [place], [place] [address] PCP: Elby Beck, FNP   Assessment & Plan: Visit Diagnoses:  1. Closed fracture of olecranon process of left ulna, initial encounter     Plan: Impression is displaced left olecranon fracture.  These findings were discussed with the patient and since he requires his upper extremities for ambulation I would recommend surgical fixation of the fracture versus triceps advancement based on intraoperative findings.  Overall his bone quality is poor radiographically so I am concerned that it may not provide excellent fixation.  Risks and benefits and rehab and recovery were discussed with the patient and his wife and they both wish to proceed with surgery.  We will plan on surgery tomorrow as an outpatient.  Follow-Up Instructions: Return for 1 week postop visit.   Orders:  No orders of the defined types were placed in this encounter.  Meds ordered this encounter  Medications  . oxyCODONE (OXY IR/ROXICODONE) 5 MG immediate release tablet    Sig: Take 1-2 tablets (5-10 mg total) by mouth 2 (two) times daily as needed.    Dispense:  5 tablet    Refill:  0      Procedures: No procedures performed   Clinical Data: No additional findings.   Subjective: Chief Complaint  Patient presents with  . Left Elbow - Injury    [name] is a left-hand-dominant gentleman who comes in today with an acute injury to his left elbow from a mechanical fall that he  sustained gained on 10/21/2017.  He comes in today for evaluation of a displaced olecranon fracture.  He has a history of alcoholism, atrial fibrillation for which she does not take any anticoagulation, bolus pemphigoid.  He does not endorse any significant pain.  Denies any numbness and tingling.  He presents today  with his wife.  He is mainly sedentary and since his hip fracture he has not really walked much.   Review of Systems  Constitutional: Negative.   All other systems reviewed and are negative.    Objective: Vital Signs: There were no vitals taken for this visit.  Physical Exam  Constitutional: He is oriented to person, place, and time. He appears well-developed and well-nourished.  HENT:  Head: Normocephalic and atraumatic.  Eyes: Pupils are equal, round, and reactive to light.  Neck: Neck supple.  Pulmonary/Chest: Effort normal.  Abdominal: Soft.  Musculoskeletal: Normal range of motion.  Neurological: He is alert and oriented to person, place, and time.  Skin: Skin is warm.  Psychiatric: He has a normal mood and affect. His behavior is normal. Judgment and thought content normal.  Nursing note and vitals reviewed.   Ortho Exam Left elbow exam shows moderate swelling and ecchymosis.  Neurovascularly intact.  Pain with palpation over the fracture site. Specialty Comments:  No specialty comments available.  Imaging: No results found.   PMFS History: Patient Active Problem List   Diagnosis Date Noted  . Acute streptococcal pharyngitis 09/06/2017  . Frequent falls 06/29/2017  . Fall 04/09/2017  . Scalp laceration   . Hypomagnesemia 10/25/2016  . Hip fracture ([place]) 10/24/2016  . Displaced fracture of left femoral neck ([place])  10/24/2016  . Thrombocytopenia ([place]) 10/24/2016  . Closed displaced fracture of left femoral neck ([place]) 10/23/2016  . Contracture of muscle of left hand 09/15/2016  . Infected laceration 07/08/2016  . Generalized muscle weakness 07/08/2016  . Anemia 05/18/2016  . Syncope and collapse   . Syncope 05/17/2016  . Cellulitis 05/17/2016  . Rib fractures 05/17/2016  . Bullous pemphigoid 05/17/2016  . ETOH abuse 03/17/2016  . Dizziness and giddiness 12/09/2015  . Chronic pain syndrome 12/09/2015  . Arthritis 11/20/2015  . Mild CAD   . DCM (dilated  cardiomyopathy) ([place])   . Abnormal nuclear stress test 05/07/2015  . PAC (premature atrial contraction) 04/07/2015  . Atrial tachycardia, paroxysmal ([place]) 03/10/2015  . Depression 04/12/2012  . C2 cervical fracture ([place]) 02/29/2012  . Alcohol withdrawal ([place]) 11/06/2011  . Fracture of humerus, proximal, left, closed 11/02/2011  . Hyponatremia 11/17/2010  . H/O: upper GI bleed 10/20/2010  . Stomach ulcer from aspirin/ibuprofen-like drugs (NSAID's) 10/20/2010  . Colon polyp 08/26/2010  . Diverticulosis of colon (without mention of hemorrhage) 08/26/2010  . BPH (benign prostatic hyperplasia) 07/02/2010  . Deficiency anemia 03/09/2010  . Hyposmolality and/or hyponatremia 10/17/2009  . ALCOHOLIC CIRRHOSIS OF LIVER 07/25/2009  . Hypokalemia 04/03/2007  . Anxiety state 01/24/2007  . HLD (hyperlipidemia) 07/28/2006  . Alcohol abuse 07/28/2006  . Essential hypertension 07/28/2006  . 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 Adenomatous polyp of colon 2006  . Anemia 2012  . Anxiety   . Arthritis    "left arm/shoulder" (05/17/2016)  . Atrial tachycardia, paroxysmal ([place]) 04/07/2015  . BPH (benign prostatic hypertrophy) 5/99  . Chronic bronchitis ([place])    "q year or q other year" (11/02/2011)  . Chronic nausea    "a few days/week" (05/17/2016)  . Colon polyp   . DCM (dilated cardiomyopathy) ([place])    EF 45-50% by echo and cath 2017  . Depression    Hx of  . Falls frequently    "daily lately; legs just give out" (11/02/2011; 05/17/2016)  . GERD (gastroesophageal reflux disease) 07/31/04   gastritis   . H/O hiatal hernia   . Hard of hearing   . Heart murmur   . Hernia, umbilical    "unrepaired" (11/02/2011; 05/17/2016)  . History of blood transfusion 2012  . HLD (hyperlipidemia) 5/99  . HTN (hypertension) 5/99  . Memory loss    "recently has been forgetting alot; maybe related to the alcohol" (11/02/2011; 05/17/2016)  . Mild CAD    10% RCA  . PAC  (premature atrial contraction) 04/07/2015  . Peripheral vascular disease ([place])   . Pneumonia 1996   hosp  . Pollen allergy    pollen/ragweed  . Seasonal allergies     Family History  Problem Relation Age of Onset  . Heart disease Father   . Diabetes Father   . Stroke Father   . Depression Mother   . Heart disease Brother   . Alcohol abuse Brother   . Liver disease Brother     Past Surgical History:  Procedure Laterality Date  . BACK SURGERY    . CARDIAC CATHETERIZATION N/A 05/23/2015   Procedure: Left Heart Cath and Coronary Angiography;  Surgeon: Troy Sine, MD;  Location: [facility];  Service: Cardiovascular;  Laterality: N/A;  . CATARACT EXTRACTION W/ INTRAOCULAR LENS  IMPLANT, BILATERAL Bilateral 10/2002  . COLONOSCOPY  07/31/04   multiple polyps; repeat in 3 years  . DUPUYTREN CONTRACTURE RELEASE Left   .  ETT myoview  03/09/07   nml EF 66%  . FRACTURE SURGERY    . HEMORRHOID SURGERY     "cauterized a long time ago" (11/02/2011)  . hosp CP R/O'D 2/24  03/08/07  . neck MRI  6/04   C5-6 disc abnormality  . ORIF HUMERUS FRACTURE  11/04/2011   Procedure: OPEN REDUCTION INTERNAL FIXATION (ORIF) PROXIMAL HUMERUS FRACTURE;  Surgeon: Rozanna Box, MD;  Location: [facility];  Service: Orthopedics;  Laterality: Left;  . POSTERIOR CERVICAL FUSION/FORAMINOTOMY N/A 03/21/2012   Procedure: POSTERIOR CERVICAL FUSION/FORAMINOTOMY LEVEL ONE;  Surgeon: Charlie Pitter, MD;  Location: [facility];  Service: Neurosurgery;  Laterality: N/A;  POSTERIOR CERVICAL ONE TO CERVICAL TWO FUSION WITH ILIAC CREST GRAFT AND LATERAL MASS SCREWS   . TONSILLECTOMY     "I was young" (11/02/2011)  . TOTAL HIP ARTHROPLASTY Left 10/24/2016   Procedure: TOTAL HIP ARTHROPLASTY ANTERIOR APPROACH;  Surgeon: Rod Can, MD;  Location: [facility];  Service: Orthopedics;  Laterality: Left;  . UPPER GASTROINTESTINAL ENDOSCOPY     Social History   Occupational History  . Occupation: Academic librarian:  [employer]  Tobacco Use  . Smoking status: Former Smoker    Types: Pipe  . Smokeless tobacco: Never Used  . Tobacco comment: 05/17/2016 "stopped in the late 1990s"  Substance and Sexual Activity  . Alcohol use: Yes    Alcohol/week: 28.0 standard drinks    Types: 28 Glasses of wine per week    Comment: "I don't always drink, but when I do I drink a lot and it's always wine. about a bottle per day."  . Drug use: No  . Sexual activity: Never

## 2017-10-26 NOTE — Telephone Encounter (Signed)
Spoke with Merrilee Seashore with [facility].  Patient has been set up and they will be out there tomorrow to see him as he didn't want them to come out today.

## 2017-10-26 NOTE — Progress Notes (Signed)
SDW-Pre-op call completed by pt spouse, [name] (DPR). Spouse denies that pt C/O chest pain. Spouse denies that pt is under the care of a cardiologist. Spouse stated that pt has not seen a cardiologist this year. Spouse stated that pt has intermittent SOB because he is often sedentary. Spouse stated that pt was prescribed Ativan today for alcohol withdrawal symptoms. Spouse made aware to have pt stop taking vitamins, fish oil, herbal medications and NSAIDs ie: Ibuprofen, Advil, Naproxen (Aleve), Motrin, BC and Goody Powder. Spouse verbalized understanding of all pre-op instructions.

## 2017-10-26 NOTE — Addendum Note (Signed)
Addended by: Azucena Cecil on: 10/26/2017 04:49 PM   Modules accepted: Orders

## 2017-10-26 NOTE — Progress Notes (Signed)
Dr. Marcie Bal, Anesthesia reviewed pt cardiac studies and made aware of new onset of ETOH withdrawal; MD advised that pt be evaluated upon arrival.

## 2017-10-27 ENCOUNTER — Ambulatory Visit (HOSPITAL_COMMUNITY): Payer: Medicare Other

## 2017-10-27 ENCOUNTER — Ambulatory Visit (HOSPITAL_COMMUNITY): Payer: Medicare Other | Admitting: Anesthesiology

## 2017-10-27 ENCOUNTER — Inpatient Hospital Stay (HOSPITAL_COMMUNITY): Payer: Medicare Other

## 2017-10-27 ENCOUNTER — Encounter (HOSPITAL_COMMUNITY)
Admission: RE | Disposition: A | Discharge status: Skilled Nursing Facility | Payer: Self-pay | Source: Home / Self Care | Attending: Internal Medicine

## 2017-10-27 ENCOUNTER — Encounter (HOSPITAL_COMMUNITY): Payer: Self-pay | Admitting: Critical Care Medicine

## 2017-10-27 ENCOUNTER — Inpatient Hospital Stay (HOSPITAL_COMMUNITY)
Admission: RE | Admit: 2017-10-27 | Discharge: 2017-11-16 | DRG: 500 | Disposition: A | Discharge status: Skilled Nursing Facility | Payer: Medicare Other | Attending: Internal Medicine | Admitting: Internal Medicine

## 2017-10-27 DIAGNOSIS — I420 Dilated cardiomyopathy: Secondary | ICD-10-CM | POA: Diagnosis present

## 2017-10-27 DIAGNOSIS — G92 Toxic encephalopathy: Secondary | ICD-10-CM | POA: Diagnosis not present

## 2017-10-27 DIAGNOSIS — Z4659 Encounter for fitting and adjustment of other gastrointestinal appliance and device: Secondary | ICD-10-CM

## 2017-10-27 DIAGNOSIS — E860 Dehydration: Secondary | ICD-10-CM | POA: Diagnosis not present

## 2017-10-27 DIAGNOSIS — F5101 Primary insomnia: Secondary | ICD-10-CM | POA: Diagnosis not present

## 2017-10-27 DIAGNOSIS — Z66 Do not resuscitate: Secondary | ICD-10-CM | POA: Diagnosis not present

## 2017-10-27 DIAGNOSIS — I25110 Atherosclerotic heart disease of native coronary artery with unstable angina pectoris: Secondary | ICD-10-CM | POA: Diagnosis not present

## 2017-10-27 DIAGNOSIS — E870 Hyperosmolality and hypernatremia: Secondary | ICD-10-CM | POA: Diagnosis not present

## 2017-10-27 DIAGNOSIS — Z741 Need for assistance with personal care: Secondary | ICD-10-CM | POA: Diagnosis not present

## 2017-10-27 DIAGNOSIS — J9811 Atelectasis: Secondary | ICD-10-CM | POA: Diagnosis not present

## 2017-10-27 DIAGNOSIS — R451 Restlessness and agitation: Secondary | ICD-10-CM | POA: Diagnosis not present

## 2017-10-27 DIAGNOSIS — Z22322 Carrier or suspected carrier of Methicillin resistant Staphylococcus aureus: Secondary | ICD-10-CM

## 2017-10-27 DIAGNOSIS — J15212 Pneumonia due to Methicillin resistant Staphylococcus aureus: Secondary | ICD-10-CM | POA: Diagnosis not present

## 2017-10-27 DIAGNOSIS — R2681 Unsteadiness on feet: Secondary | ICD-10-CM | POA: Diagnosis not present

## 2017-10-27 DIAGNOSIS — D649 Anemia, unspecified: Secondary | ICD-10-CM | POA: Diagnosis not present

## 2017-10-27 DIAGNOSIS — F419 Anxiety disorder, unspecified: Secondary | ICD-10-CM | POA: Diagnosis not present

## 2017-10-27 DIAGNOSIS — E871 Hypo-osmolality and hyponatremia: Secondary | ICD-10-CM | POA: Diagnosis not present

## 2017-10-27 DIAGNOSIS — Z9289 Personal history of other medical treatment: Secondary | ICD-10-CM

## 2017-10-27 DIAGNOSIS — F10231 Alcohol dependence with withdrawal delirium: Secondary | ICD-10-CM | POA: Diagnosis not present

## 2017-10-27 DIAGNOSIS — E861 Hypovolemia: Secondary | ICD-10-CM | POA: Diagnosis not present

## 2017-10-27 DIAGNOSIS — F1027 Alcohol dependence with alcohol-induced persisting dementia: Secondary | ICD-10-CM | POA: Diagnosis present

## 2017-10-27 DIAGNOSIS — R1310 Dysphagia, unspecified: Secondary | ICD-10-CM | POA: Diagnosis not present

## 2017-10-27 DIAGNOSIS — I10 Essential (primary) hypertension: Secondary | ICD-10-CM | POA: Diagnosis not present

## 2017-10-27 DIAGNOSIS — R29898 Other symptoms and signs involving the musculoskeletal system: Secondary | ICD-10-CM | POA: Diagnosis not present

## 2017-10-27 DIAGNOSIS — J9690 Respiratory failure, unspecified, unspecified whether with hypoxia or hypercapnia: Secondary | ICD-10-CM

## 2017-10-27 DIAGNOSIS — Z7401 Bed confinement status: Secondary | ICD-10-CM | POA: Diagnosis not present

## 2017-10-27 DIAGNOSIS — Z9842 Cataract extraction status, left eye: Secondary | ICD-10-CM

## 2017-10-27 DIAGNOSIS — Z9911 Dependence on respirator [ventilator] status: Secondary | ICD-10-CM

## 2017-10-27 DIAGNOSIS — Z781 Physical restraint status: Secondary | ICD-10-CM

## 2017-10-27 DIAGNOSIS — Z978 Presence of other specified devices: Secondary | ICD-10-CM | POA: Diagnosis not present

## 2017-10-27 DIAGNOSIS — R300 Dysuria: Secondary | ICD-10-CM | POA: Diagnosis not present

## 2017-10-27 DIAGNOSIS — I5033 Acute on chronic diastolic (congestive) heart failure: Secondary | ICD-10-CM | POA: Diagnosis present

## 2017-10-27 DIAGNOSIS — G8918 Other acute postprocedural pain: Secondary | ICD-10-CM | POA: Diagnosis not present

## 2017-10-27 DIAGNOSIS — F10921 Alcohol use, unspecified with intoxication delirium: Secondary | ICD-10-CM | POA: Diagnosis not present

## 2017-10-27 DIAGNOSIS — I480 Paroxysmal atrial fibrillation: Secondary | ICD-10-CM | POA: Diagnosis present

## 2017-10-27 DIAGNOSIS — Z7951 Long term (current) use of inhaled steroids: Secondary | ICD-10-CM

## 2017-10-27 DIAGNOSIS — R627 Adult failure to thrive: Secondary | ICD-10-CM | POA: Diagnosis not present

## 2017-10-27 DIAGNOSIS — S52022A Displaced fracture of olecranon process without intraarticular extension of left ulna, initial encounter for closed fracture: Secondary | ICD-10-CM | POA: Diagnosis not present

## 2017-10-27 DIAGNOSIS — R1312 Dysphagia, oropharyngeal phase: Secondary | ICD-10-CM | POA: Diagnosis not present

## 2017-10-27 DIAGNOSIS — I110 Hypertensive heart disease with heart failure: Secondary | ICD-10-CM | POA: Diagnosis present

## 2017-10-27 DIAGNOSIS — Z8249 Family history of ischemic heart disease and other diseases of the circulatory system: Secondary | ICD-10-CM

## 2017-10-27 DIAGNOSIS — E876 Hypokalemia: Secondary | ICD-10-CM | POA: Diagnosis not present

## 2017-10-27 DIAGNOSIS — F10930 Alcohol use, unspecified with withdrawal, uncomplicated: Secondary | ICD-10-CM

## 2017-10-27 DIAGNOSIS — Z87891 Personal history of nicotine dependence: Secondary | ICD-10-CM

## 2017-10-27 DIAGNOSIS — W19XXXA Unspecified fall, initial encounter: Secondary | ICD-10-CM | POA: Diagnosis present

## 2017-10-27 DIAGNOSIS — Z96642 Presence of left artificial hip joint: Secondary | ICD-10-CM | POA: Diagnosis present

## 2017-10-27 DIAGNOSIS — N400 Enlarged prostate without lower urinary tract symptoms: Secondary | ICD-10-CM | POA: Diagnosis present

## 2017-10-27 DIAGNOSIS — F329 Major depressive disorder, single episode, unspecified: Secondary | ICD-10-CM | POA: Diagnosis present

## 2017-10-27 DIAGNOSIS — R918 Other nonspecific abnormal finding of lung field: Secondary | ICD-10-CM | POA: Diagnosis not present

## 2017-10-27 DIAGNOSIS — Y92009 Unspecified place in unspecified non-institutional (private) residence as the place of occurrence of the external cause: Secondary | ICD-10-CM | POA: Diagnosis not present

## 2017-10-27 DIAGNOSIS — I482 Chronic atrial fibrillation, unspecified: Secondary | ICD-10-CM | POA: Diagnosis not present

## 2017-10-27 DIAGNOSIS — R0689 Other abnormalities of breathing: Secondary | ICD-10-CM

## 2017-10-27 DIAGNOSIS — J810 Acute pulmonary edema: Secondary | ICD-10-CM | POA: Diagnosis not present

## 2017-10-27 DIAGNOSIS — K219 Gastro-esophageal reflux disease without esophagitis: Secondary | ICD-10-CM | POA: Diagnosis present

## 2017-10-27 DIAGNOSIS — R0600 Dyspnea, unspecified: Secondary | ICD-10-CM

## 2017-10-27 DIAGNOSIS — J302 Other seasonal allergic rhinitis: Secondary | ICD-10-CM | POA: Diagnosis present

## 2017-10-27 DIAGNOSIS — Z4789 Encounter for other orthopedic aftercare: Secondary | ICD-10-CM | POA: Diagnosis not present

## 2017-10-27 DIAGNOSIS — R0603 Acute respiratory distress: Secondary | ICD-10-CM

## 2017-10-27 DIAGNOSIS — Z79899 Other long term (current) drug therapy: Secondary | ICD-10-CM

## 2017-10-27 DIAGNOSIS — Z7189 Other specified counseling: Secondary | ICD-10-CM | POA: Diagnosis not present

## 2017-10-27 DIAGNOSIS — J471 Bronchiectasis with (acute) exacerbation: Secondary | ICD-10-CM | POA: Diagnosis not present

## 2017-10-27 DIAGNOSIS — G9340 Encephalopathy, unspecified: Secondary | ICD-10-CM

## 2017-10-27 DIAGNOSIS — Z9841 Cataract extraction status, right eye: Secondary | ICD-10-CM

## 2017-10-27 DIAGNOSIS — M19012 Primary osteoarthritis, left shoulder: Secondary | ICD-10-CM | POA: Diagnosis present

## 2017-10-27 DIAGNOSIS — J9600 Acute respiratory failure, unspecified whether with hypoxia or hypercapnia: Secondary | ICD-10-CM | POA: Diagnosis not present

## 2017-10-27 DIAGNOSIS — M2550 Pain in unspecified joint: Secondary | ICD-10-CM | POA: Diagnosis not present

## 2017-10-27 DIAGNOSIS — K7460 Unspecified cirrhosis of liver: Secondary | ICD-10-CM | POA: Diagnosis present

## 2017-10-27 DIAGNOSIS — Z515 Encounter for palliative care: Secondary | ICD-10-CM

## 2017-10-27 DIAGNOSIS — Z8701 Personal history of pneumonia (recurrent): Secondary | ICD-10-CM

## 2017-10-27 DIAGNOSIS — F10230 Alcohol dependence with withdrawal, uncomplicated: Secondary | ICD-10-CM

## 2017-10-27 DIAGNOSIS — Z91030 Bee allergy status: Secondary | ICD-10-CM

## 2017-10-27 DIAGNOSIS — Z4682 Encounter for fitting and adjustment of non-vascular catheter: Secondary | ICD-10-CM | POA: Diagnosis not present

## 2017-10-27 DIAGNOSIS — M25512 Pain in left shoulder: Secondary | ICD-10-CM | POA: Diagnosis not present

## 2017-10-27 DIAGNOSIS — J9601 Acute respiratory failure with hypoxia: Secondary | ICD-10-CM | POA: Diagnosis not present

## 2017-10-27 DIAGNOSIS — Z811 Family history of alcohol abuse and dependence: Secondary | ICD-10-CM

## 2017-10-27 DIAGNOSIS — E785 Hyperlipidemia, unspecified: Secondary | ICD-10-CM | POA: Diagnosis present

## 2017-10-27 DIAGNOSIS — Z818 Family history of other mental and behavioral disorders: Secondary | ICD-10-CM

## 2017-10-27 DIAGNOSIS — Z9889 Other specified postprocedural states: Secondary | ICD-10-CM

## 2017-10-27 DIAGNOSIS — I2510 Atherosclerotic heart disease of native coronary artery without angina pectoris: Secondary | ICD-10-CM | POA: Diagnosis present

## 2017-10-27 DIAGNOSIS — Z888 Allergy status to other drugs, medicaments and biological substances status: Secondary | ICD-10-CM

## 2017-10-27 DIAGNOSIS — Z981 Arthrodesis status: Secondary | ICD-10-CM

## 2017-10-27 DIAGNOSIS — I739 Peripheral vascular disease, unspecified: Secondary | ICD-10-CM | POA: Diagnosis present

## 2017-10-27 DIAGNOSIS — R488 Other symbolic dysfunctions: Secondary | ICD-10-CM | POA: Diagnosis not present

## 2017-10-27 DIAGNOSIS — H9190 Unspecified hearing loss, unspecified ear: Secondary | ICD-10-CM | POA: Diagnosis present

## 2017-10-27 DIAGNOSIS — Z961 Presence of intraocular lens: Secondary | ICD-10-CM | POA: Diagnosis present

## 2017-10-27 DIAGNOSIS — B9562 Methicillin resistant Staphylococcus aureus infection as the cause of diseases classified elsewhere: Secondary | ICD-10-CM | POA: Diagnosis not present

## 2017-10-27 DIAGNOSIS — S51802D Unspecified open wound of left forearm, subsequent encounter: Secondary | ICD-10-CM | POA: Diagnosis not present

## 2017-10-27 DIAGNOSIS — R296 Repeated falls: Secondary | ICD-10-CM | POA: Diagnosis present

## 2017-10-27 DIAGNOSIS — M6281 Muscle weakness (generalized): Secondary | ICD-10-CM | POA: Diagnosis not present

## 2017-10-27 DIAGNOSIS — G8929 Other chronic pain: Secondary | ICD-10-CM | POA: Diagnosis present

## 2017-10-27 DIAGNOSIS — J189 Pneumonia, unspecified organism: Secondary | ICD-10-CM | POA: Diagnosis not present

## 2017-10-27 DIAGNOSIS — M25522 Pain in left elbow: Secondary | ICD-10-CM | POA: Diagnosis not present

## 2017-10-27 DIAGNOSIS — S52022D Displaced fracture of olecranon process without intraarticular extension of left ulna, subsequent encounter for closed fracture with routine healing: Secondary | ICD-10-CM | POA: Diagnosis not present

## 2017-10-27 HISTORY — DX: Alcohol dependence with alcohol-induced persisting dementia: F10.27

## 2017-10-27 HISTORY — DX: Other specified postprocedural states: Z98.890

## 2017-10-27 HISTORY — DX: Unspecified cirrhosis of liver: K74.60

## 2017-10-27 HISTORY — DX: Nausea with vomiting, unspecified: R11.2

## 2017-10-27 HISTORY — PX: ORIF ELBOW FRACTURE: SHX5031

## 2017-10-27 HISTORY — DX: Presence of spectacles and contact lenses: Z97.3

## 2017-10-27 LAB — CBC
HCT: 31.9 % — ABNORMAL LOW (ref 39.0–52.0)
HEMATOCRIT: 30.5 % — AB (ref 39.0–52.0)
Hemoglobin: 10 g/dL — ABNORMAL LOW (ref 13.0–17.0)
Hemoglobin: 10.3 g/dL — ABNORMAL LOW (ref 13.0–17.0)
MCH: 28.6 pg (ref 26.0–34.0)
MCH: 28.1 pg (ref 26.0–34.0)
MCHC: 32.8 g/dL (ref 30.0–36.0)
MCHC: 32.3 g/dL (ref 30.0–36.0)
MCV: 87.1 fL (ref 80.0–100.0)
MCV: 87.2 fL (ref 80.0–100.0)
NRBC: 0 % (ref 0.0–0.2)
NRBC: 0 % (ref 0.0–0.2)
PLATELETS: 157 10*3/uL (ref 150–400)
PLATELETS: 192 10*3/uL (ref 150–400)
RBC: 3.5 MIL/uL — ABNORMAL LOW (ref 4.22–5.81)
RBC: 3.66 MIL/uL — ABNORMAL LOW (ref 4.22–5.81)
RDW: 15.6 % — AB (ref 11.5–15.5)
RDW: 15.5 % (ref 11.5–15.5)
WBC: 3.1 10*3/uL — AB (ref 4.0–10.5)
WBC: 4.3 10*3/uL (ref 4.0–10.5)

## 2017-10-27 LAB — COMPREHENSIVE METABOLIC PANEL
ALT: 25 U/L (ref 0–44)
ANION GAP: 17 — AB (ref 5–15)
AST: 50 U/L — ABNORMAL HIGH (ref 15–41)
Albumin: 3 g/dL — ABNORMAL LOW (ref 3.5–5.0)
Alkaline Phosphatase: 96 U/L (ref 38–126)
BILIRUBIN TOTAL: 1.4 mg/dL — AB (ref 0.3–1.2)
BUN: 20 mg/dL (ref 8–23)
CO2: 24 mmol/L (ref 22–32)
Calcium: 8.2 mg/dL — ABNORMAL LOW (ref 8.9–10.3)
Chloride: 93 mmol/L — ABNORMAL LOW (ref 98–111)
Creatinine, Ser: 1.08 mg/dL (ref 0.61–1.24)
Glucose, Bld: 112 mg/dL — ABNORMAL HIGH (ref 70–99)
POTASSIUM: 2.9 mmol/L — AB (ref 3.5–5.1)
Sodium: 134 mmol/L — ABNORMAL LOW (ref 135–145)
TOTAL PROTEIN: 5.9 g/dL — AB (ref 6.5–8.1)

## 2017-10-27 LAB — GLUCOSE, CAPILLARY: GLUCOSE-CAPILLARY: 127 mg/dL — AB (ref 70–99)

## 2017-10-27 LAB — SURGICAL PCR SCREEN
MRSA, PCR: POSITIVE — AB
STAPHYLOCOCCUS AUREUS: POSITIVE — AB

## 2017-10-27 LAB — TROPONIN I

## 2017-10-27 LAB — POCT I-STAT 4, (NA,K, GLUC, HGB,HCT)
Glucose, Bld: 98 mg/dL (ref 70–99)
HEMATOCRIT: 32 % — AB (ref 39.0–52.0)
HEMOGLOBIN: 10.9 g/dL — AB (ref 13.0–17.0)
Potassium: 3.1 mmol/L — ABNORMAL LOW (ref 3.5–5.1)
Sodium: 134 mmol/L — ABNORMAL LOW (ref 135–145)

## 2017-10-27 SURGERY — OPEN REDUCTION INTERNAL FIXATION (ORIF) ELBOW/OLECRANON FRACTURE
Anesthesia: Regional | Site: Elbow | Laterality: Left

## 2017-10-27 MED ORDER — ONDANSETRON HCL 4 MG PO TABS: 4.0000 mg | ORAL_TABLET | Freq: Three times a day (TID) | ORAL | 0 refills | Status: DC | PRN

## 2017-10-27 MED ORDER — ENOXAPARIN SODIUM 40 MG/0.4ML ~~LOC~~ SOLN
40.0000 mg | SUBCUTANEOUS | Status: DC
  Administered 2017-10-28 – 2017-11-16 (×20): 40 mg via SUBCUTANEOUS
  Filled 2017-10-27 (×20): qty 0.4

## 2017-10-27 MED ORDER — BUPIVACAINE HCL (PF) 0.25 % IJ SOLN
INTRAMUSCULAR | Status: AC
  Filled 2017-10-27: qty 30

## 2017-10-27 MED ORDER — ONDANSETRON HCL 4 MG/2ML IJ SOLN: 4.0000 mg | Freq: Once | INTRAMUSCULAR | Status: DC | PRN

## 2017-10-27 MED ORDER — PROPOFOL 10 MG/ML IV BOLUS
INTRAVENOUS | Status: DC | PRN
  Administered 2017-10-27: 180 mg via INTRAVENOUS

## 2017-10-27 MED ORDER — ORAL CARE MOUTH RINSE
15.0000 mL | OROMUCOSAL | Status: DC
  Administered 2017-10-27 – 2017-11-16 (×152): 15 mL via OROMUCOSAL

## 2017-10-27 MED ORDER — FENTANYL CITRATE (PF) 100 MCG/2ML IJ SOLN
50.0000 ug | INTRAMUSCULAR | Status: DC | PRN
  Administered 2017-10-27 – 2017-10-30 (×15): 75 ug via INTRAVENOUS
  Administered 2017-10-30 (×2): 50 ug via INTRAVENOUS
  Administered 2017-10-30: 75 ug via INTRAVENOUS
  Administered 2017-10-30: 50 ug via INTRAVENOUS
  Administered 2017-10-30 – 2017-11-01 (×7): 75 ug via INTRAVENOUS
  Administered 2017-11-01: 50 ug via INTRAVENOUS
  Administered 2017-11-01: 75 ug via INTRAVENOUS
  Administered 2017-11-01: 50 ug via INTRAVENOUS
  Administered 2017-11-01 (×2): 75 ug via INTRAVENOUS
  Administered 2017-11-02: 50 ug via INTRAVENOUS
  Administered 2017-11-02 – 2017-11-03 (×2): 75 ug via INTRAVENOUS
  Filled 2017-10-27 (×29): qty 2

## 2017-10-27 MED ORDER — DOCUSATE SODIUM 50 MG/5ML PO LIQD: 100.0000 mg | Freq: Two times a day (BID) | ORAL | Status: DC | PRN

## 2017-10-27 MED ORDER — CHLORHEXIDINE GLUCONATE CLOTH 2 % EX PADS
6.0000 | MEDICATED_PAD | Freq: Every day | CUTANEOUS | Status: DC
  Administered 2017-10-28: 6 via TOPICAL

## 2017-10-27 MED ORDER — CLONIDINE HCL (ANALGESIA) 100 MCG/ML EP SOLN
EPIDURAL | Status: DC | PRN
  Administered 2017-10-27: 100 ug

## 2017-10-27 MED ORDER — LACTATED RINGERS IV SOLN
INTRAVENOUS | Status: DC
  Administered 2017-10-27 – 2017-10-29 (×4): via INTRAVENOUS

## 2017-10-27 MED ORDER — SUGAMMADEX SODIUM 200 MG/2ML IV SOLN
INTRAVENOUS | Status: DC | PRN
  Administered 2017-10-27: 200 mg via INTRAVENOUS

## 2017-10-27 MED ORDER — MIDAZOLAM HCL 2 MG/2ML IJ SOLN
1.0000 mg | INTRAMUSCULAR | Status: AC | PRN
  Administered 2017-10-27 – 2017-10-28 (×3): 1 mg via INTRAVENOUS
  Filled 2017-10-27 (×2): qty 2

## 2017-10-27 MED ORDER — METOPROLOL TARTRATE 5 MG/5ML IV SOLN
INTRAVENOUS | Status: AC
  Administered 2017-10-27: 2 mg via INTRAVENOUS
  Filled 2017-10-27: qty 5

## 2017-10-27 MED ORDER — OXYCODONE HCL 5 MG PO TABS: 5.0000 mg | ORAL_TABLET | ORAL | 0 refills | Status: DC | PRN

## 2017-10-27 MED ORDER — PHENYLEPHRINE 40 MCG/ML (10ML) SYRINGE FOR IV PUSH (FOR BLOOD PRESSURE SUPPORT)
PREFILLED_SYRINGE | INTRAVENOUS | Status: AC
  Filled 2017-10-27: qty 30

## 2017-10-27 MED ORDER — DEXMEDETOMIDINE HCL IN NACL 400 MCG/100ML IV SOLN
0.4000 ug/kg/h | INTRAVENOUS | Status: DC
  Administered 2017-10-27: 1.2 ug/kg/h via INTRAVENOUS
  Administered 2017-10-28: 1.7 ug/kg/h via INTRAVENOUS
  Administered 2017-10-28: 1.6 ug/kg/h via INTRAVENOUS
  Administered 2017-10-28: 1 ug/kg/h via INTRAVENOUS
  Administered 2017-10-28 (×2): 1.7 ug/kg/h via INTRAVENOUS
  Administered 2017-10-28: 1.2 ug/kg/h via INTRAVENOUS
  Administered 2017-10-28: 1 ug/kg/h via INTRAVENOUS
  Administered 2017-10-28 – 2017-10-29 (×2): 1.2 ug/kg/h via INTRAVENOUS
  Administered 2017-10-29: 1 ug/kg/h via INTRAVENOUS
  Administered 2017-10-29 (×4): 1.2 ug/kg/h via INTRAVENOUS
  Administered 2017-10-30: 0.9 ug/kg/h via INTRAVENOUS
  Administered 2017-10-30: 1.7 ug/kg/h via INTRAVENOUS
  Administered 2017-10-30 (×3): 1.2 ug/kg/h via INTRAVENOUS
  Administered 2017-10-30 (×2): 1.7 ug/kg/h via INTRAVENOUS
  Administered 2017-10-31: 1.2 ug/kg/h via INTRAVENOUS
  Administered 2017-10-31: 1 ug/kg/h via INTRAVENOUS
  Administered 2017-10-31: 1.2 ug/kg/h via INTRAVENOUS
  Administered 2017-10-31: 0.9 ug/kg/h via INTRAVENOUS
  Administered 2017-10-31 – 2017-11-01 (×11): 2 ug/kg/h via INTRAVENOUS
  Administered 2017-11-01: 1.6 ug/kg/h via INTRAVENOUS
  Administered 2017-11-02 – 2017-11-04 (×24): 2 ug/kg/h via INTRAVENOUS
  Administered 2017-11-04: 1.8 ug/kg/h via INTRAVENOUS
  Administered 2017-11-04 – 2017-11-05 (×2): 2 ug/kg/h via INTRAVENOUS
  Administered 2017-11-05 (×2): 1.6 ug/kg/h via INTRAVENOUS
  Administered 2017-11-05: 1.4 ug/kg/h via INTRAVENOUS
  Filled 2017-10-27 (×26): qty 100
  Filled 2017-10-27: qty 200
  Filled 2017-10-27 (×30): qty 100
  Filled 2017-10-27: qty 200
  Filled 2017-10-27 (×14): qty 100

## 2017-10-27 MED ORDER — FENTANYL CITRATE (PF) 100 MCG/2ML IJ SOLN
25.0000 ug | INTRAMUSCULAR | Status: DC | PRN
  Administered 2017-10-27 (×3): 50 ug via INTRAVENOUS

## 2017-10-27 MED ORDER — DEXAMETHASONE SODIUM PHOSPHATE 10 MG/ML IJ SOLN
INTRAMUSCULAR | Status: DC | PRN
  Administered 2017-10-27: 10 mg via INTRAVENOUS

## 2017-10-27 MED ORDER — DEXMEDETOMIDINE HCL IN NACL 200 MCG/50ML IV SOLN
0.4000 ug/kg/h | INTRAVENOUS | Status: DC
  Administered 2017-10-27: 0.709 ug/kg/h via INTRAVENOUS

## 2017-10-27 MED ORDER — ROPIVACAINE HCL 7.5 MG/ML IJ SOLN
INTRAMUSCULAR | Status: DC | PRN
  Administered 2017-10-27: 20 mL via PERINEURAL

## 2017-10-27 MED ORDER — CHLORHEXIDINE GLUCONATE 4 % EX LIQD: 60.0000 mL | Freq: Once | CUTANEOUS | Status: DC

## 2017-10-27 MED ORDER — PROPOFOL 10 MG/ML IV BOLUS
INTRAVENOUS | Status: AC
  Filled 2017-10-27: qty 20

## 2017-10-27 MED ORDER — POTASSIUM CHLORIDE 20 MEQ/15ML (10%) PO SOLN
40.0000 meq | ORAL | Status: AC
  Administered 2017-10-27 (×2): 40 meq
  Filled 2017-10-27 (×2): qty 30

## 2017-10-27 MED ORDER — PHENYLEPHRINE 40 MCG/ML (10ML) SYRINGE FOR IV PUSH (FOR BLOOD PRESSURE SUPPORT)
PREFILLED_SYRINGE | INTRAVENOUS | Status: DC | PRN
  Administered 2017-10-27: 80 ug via INTRAVENOUS
  Administered 2017-10-27 (×2): 40 ug via INTRAVENOUS

## 2017-10-27 MED ORDER — SODIUM CHLORIDE 0.9 % IV SOLN
INTRAVENOUS | Status: DC | PRN
  Administered 2017-10-27: 25 ug/min via INTRAVENOUS

## 2017-10-27 MED ORDER — MIDAZOLAM HCL 2 MG/2ML IJ SOLN
INTRAMUSCULAR | Status: AC
  Filled 2017-10-27: qty 2

## 2017-10-27 MED ORDER — EPHEDRINE 5 MG/ML INJ
INTRAVENOUS | Status: AC
  Filled 2017-10-27: qty 10

## 2017-10-27 MED ORDER — MIDAZOLAM HCL 5 MG/5ML IJ SOLN
INTRAMUSCULAR | Status: DC | PRN
  Administered 2017-10-27 (×2): 1 mg via INTRAVENOUS

## 2017-10-27 MED ORDER — METOPROLOL TARTRATE 5 MG/5ML IV SOLN
2.0000 mg | Freq: Once | INTRAVENOUS | Status: AC
  Administered 2017-10-27: 2 mg via INTRAVENOUS

## 2017-10-27 MED ORDER — ONDANSETRON HCL 4 MG/2ML IJ SOLN
INTRAMUSCULAR | Status: DC | PRN
  Administered 2017-10-27: 4 mg via INTRAVENOUS

## 2017-10-27 MED ORDER — LIDOCAINE 2% (20 MG/ML) 5 ML SYRINGE
INTRAMUSCULAR | Status: DC | PRN
  Administered 2017-10-27: 100 mg via INTRAVENOUS

## 2017-10-27 MED ORDER — METHOCARBAMOL 750 MG PO TABS: 750.0000 mg | ORAL_TABLET | Freq: Two times a day (BID) | ORAL | 0 refills | Status: DC | PRN

## 2017-10-27 MED ORDER — VANCOMYCIN HCL IN DEXTROSE 1-5 GM/200ML-% IV SOLN
1000.0000 mg | INTRAVENOUS | Status: AC
  Administered 2017-10-27: 1000 mg via INTRAVENOUS
  Filled 2017-10-27: qty 200

## 2017-10-27 MED ORDER — PROMETHAZINE HCL 25 MG PO TABS: 25.0000 mg | ORAL_TABLET | Freq: Four times a day (QID) | ORAL | 1 refills | Status: DC | PRN

## 2017-10-27 MED ORDER — FENTANYL CITRATE (PF) 100 MCG/2ML IJ SOLN
INTRAMUSCULAR | Status: AC
  Filled 2017-10-27: qty 2

## 2017-10-27 MED ORDER — DEXMEDETOMIDINE HCL IN NACL 200 MCG/50ML IV SOLN
INTRAVENOUS | Status: DC | PRN
  Administered 2017-10-27: .7 ug/kg/h via INTRAVENOUS

## 2017-10-27 MED ORDER — 0.9 % SODIUM CHLORIDE (POUR BTL) OPTIME
TOPICAL | Status: DC | PRN
  Administered 2017-10-27 (×3): 1000 mL

## 2017-10-27 MED ORDER — ALBUTEROL SULFATE (2.5 MG/3ML) 0.083% IN NEBU: 2.5000 mg | INHALATION_SOLUTION | RESPIRATORY_TRACT | Status: DC | PRN

## 2017-10-27 MED ORDER — CHLORHEXIDINE GLUCONATE 0.12% ORAL RINSE (MEDLINE KIT)
15.0000 mL | Freq: Two times a day (BID) | OROMUCOSAL | Status: DC
  Administered 2017-10-27 – 2017-11-16 (×31): 15 mL via OROMUCOSAL

## 2017-10-27 MED ORDER — ROCURONIUM BROMIDE 10 MG/ML (PF) SYRINGE
PREFILLED_SYRINGE | INTRAVENOUS | Status: DC | PRN
  Administered 2017-10-27: 50 mg via INTRAVENOUS

## 2017-10-27 MED ORDER — SENNOSIDES-DOCUSATE SODIUM 8.6-50 MG PO TABS: 1.0000 | ORAL_TABLET | Freq: Every evening | ORAL | 1 refills | Status: DC | PRN

## 2017-10-27 MED ORDER — OXYCODONE HCL ER 10 MG PO T12A: 10.0000 mg | EXTENDED_RELEASE_TABLET | Freq: Two times a day (BID) | ORAL | 0 refills | Status: DC

## 2017-10-27 MED ORDER — VANCOMYCIN HCL 1000 MG IV SOLR
INTRAVENOUS | Status: DC
  Filled 2017-10-27: qty 1000

## 2017-10-27 MED ORDER — MUPIROCIN 2 % EX OINT
1.0000 "application " | TOPICAL_OINTMENT | Freq: Two times a day (BID) | CUTANEOUS | Status: AC
  Administered 2017-10-27 – 2017-11-01 (×10): 1 via NASAL
  Filled 2017-10-27 (×2): qty 22

## 2017-10-27 MED ORDER — LACTATED RINGERS IV SOLN: INTRAVENOUS | Status: DC

## 2017-10-27 MED ORDER — DEXMEDETOMIDINE HCL IN NACL 200 MCG/50ML IV SOLN
0.0000 ug/kg/h | INTRAVENOUS | Status: DC
  Filled 2017-10-27: qty 50

## 2017-10-27 MED ORDER — MIDAZOLAM HCL 2 MG/2ML IJ SOLN: 1.0000 mg | INTRAMUSCULAR | Status: DC | PRN

## 2017-10-27 MED ORDER — DEXTROSE 5 % IV SOLN
3.0000 g | INTRAVENOUS | Status: AC
  Administered 2017-10-27: 3 g via INTRAVENOUS
  Filled 2017-10-27: qty 3

## 2017-10-27 MED ORDER — FENTANYL CITRATE (PF) 250 MCG/5ML IJ SOLN
INTRAMUSCULAR | Status: AC
  Filled 2017-10-27: qty 5

## 2017-10-27 MED ORDER — FENTANYL CITRATE (PF) 100 MCG/2ML IJ SOLN
50.0000 ug | INTRAMUSCULAR | Status: DC | PRN
  Administered 2017-10-27: 50 ug via INTRAVENOUS
  Filled 2017-10-27 (×3): qty 2

## 2017-10-27 MED ORDER — DEXMEDETOMIDINE HCL IN NACL 200 MCG/50ML IV SOLN
INTRAVENOUS | Status: AC
  Filled 2017-10-27: qty 50

## 2017-10-27 MED ORDER — FAMOTIDINE IN NACL 20-0.9 MG/50ML-% IV SOLN
20.0000 mg | Freq: Two times a day (BID) | INTRAVENOUS | Status: DC
  Administered 2017-10-27 – 2017-10-28 (×2): 20 mg via INTRAVENOUS
  Filled 2017-10-27 (×2): qty 50

## 2017-10-27 MED ORDER — FENTANYL CITRATE (PF) 250 MCG/5ML IJ SOLN
INTRAMUSCULAR | Status: DC | PRN
  Administered 2017-10-27 (×2): 50 ug via INTRAVENOUS

## 2017-10-27 SURGICAL SUPPLY — 72 items
BANDAGE ACE 4X5 VEL STRL LF (GAUZE/BANDAGES/DRESSINGS) ×6 IMPLANT
BIT DRILL 2.0 (BIT) ×2
BIT DRILL 2.0MM (BIT) ×1
BIT DRILL 2.5X2.75 QC CALB (BIT) ×2 IMPLANT
BIT DRILL 2XNS DISP SS SM FRAG (BIT) IMPLANT
BIT DRILL CALIBRATED 2.7 (BIT) ×1 IMPLANT
BIT DRILL CALIBRATED 2.7MM (BIT) ×1
BIT DRL 2XNS DISP SS SM FRAG (BIT) ×1
BNDG CMPR 9X4 STRL LF SNTH (GAUZE/BANDAGES/DRESSINGS) ×1
BNDG ESMARK 4X9 LF (GAUZE/BANDAGES/DRESSINGS) ×2 IMPLANT
COVER SURGICAL LIGHT HANDLE (MISCELLANEOUS) ×3 IMPLANT
COVER WAND RF STERILE (DRAPES) ×3 IMPLANT
CUFF TOURNIQUET SINGLE 18IN (TOURNIQUET CUFF) ×3 IMPLANT
DRAPE C-ARM 42X72 X-RAY (DRAPES) ×3 IMPLANT
DRAPE IMP U-DRAPE 54X76 (DRAPES) ×3 IMPLANT
DRAPE INCISE IOBAN 66X45 STRL (DRAPES) ×3 IMPLANT
DRAPE U-SHAPE 47X51 STRL (DRAPES) ×3 IMPLANT
DRSG TEGADERM 4X4.75 (GAUZE/BANDAGES/DRESSINGS) ×12 IMPLANT
ELECT CAUTERY BLADE 6.4 (BLADE) ×3 IMPLANT
ELECT REM PT RETURN 9FT ADLT (ELECTROSURGICAL) ×3
ELECTRODE REM PT RTRN 9FT ADLT (ELECTROSURGICAL) ×1 IMPLANT
FACESHIELD WRAPAROUND (MASK) ×3 IMPLANT
GAUZE SPONGE 4X4 12PLY STRL (GAUZE/BANDAGES/DRESSINGS) ×2 IMPLANT
GAUZE XEROFORM 1X8 LF (GAUZE/BANDAGES/DRESSINGS) ×3 IMPLANT
GLOVE BIOGEL PI IND STRL 7.0 (GLOVE) ×1 IMPLANT
GLOVE BIOGEL PI INDICATOR 7.0 (GLOVE) ×2
GLOVE ECLIPSE 7.0 STRL STRAW (GLOVE) ×3 IMPLANT
GLOVE SKINSENSE NS SZ7.5 (GLOVE) ×8
GLOVE SKINSENSE STRL SZ7.5 (GLOVE) ×4 IMPLANT
GLOVE SURG SS PI 6.0 STRL IVOR (GLOVE) ×3 IMPLANT
GLOVE SURG SS PI 7.0 STRL IVOR (GLOVE) ×3 IMPLANT
GLOVE SURG SYN 7.5  E (GLOVE) ×8
GLOVE SURG SYN 7.5 E (GLOVE) ×4 IMPLANT
GLOVE SURG SYN 7.5 PF PI (GLOVE) IMPLANT
GOWN STRL REIN XL XLG (GOWN DISPOSABLE) ×6 IMPLANT
K-WIRE ACE 1.6X6 (WIRE) ×6
KIT BASIN OR (CUSTOM PROCEDURE TRAY) ×3 IMPLANT
KIT TURNOVER KIT B (KITS) ×3 IMPLANT
KWIRE ACE 1.6X6 (WIRE) IMPLANT
MANIFOLD NEPTUNE II (INSTRUMENTS) ×3 IMPLANT
NS IRRIG 1000ML POUR BTL (IV SOLUTION) ×7 IMPLANT
PACK SHOULDER (CUSTOM PROCEDURE TRAY) ×3 IMPLANT
PACK UNIVERSAL I (CUSTOM PROCEDURE TRAY) ×3 IMPLANT
PAD ARMBOARD 7.5X6 YLW CONV (MISCELLANEOUS) ×8 IMPLANT
PAD CAST 4YDX4 CTTN HI CHSV (CAST SUPPLIES) ×1 IMPLANT
PADDING CAST ABS 4INX4YD NS (CAST SUPPLIES) ×2
PADDING CAST ABS COTTON 4X4 ST (CAST SUPPLIES) IMPLANT
PADDING CAST COTTON 4X4 STRL (CAST SUPPLIES) ×3
PASSER SUT SWANSON 36MM LOOP (INSTRUMENTS) ×3 IMPLANT
PLATE OLECRANON SM (Plate) ×3 IMPLANT
SCREW LOCK CORT STAR 3.5X18 (Screw) ×2 IMPLANT
SCREW LOCK CORT STAR 3.5X22 (Screw) ×2 IMPLANT
SCREW LOCK CORT STAR 3.5X24 (Screw) ×2 IMPLANT
SCREW LOCK CORT STAR 3.5X36 (Screw) ×2 IMPLANT
SCREW LOCK CORT STAR 3.5X56 (Screw) ×2 IMPLANT
SCREW LP NL T15 3.5X24 (Screw) ×2 IMPLANT
SLING ARM IMMOBILIZER LRG (SOFTGOODS) ×3 IMPLANT
SPLINT FIBERGLASS 3X35 (CAST SUPPLIES) ×2 IMPLANT
SPONGE LAP 18X18 X RAY DECT (DISPOSABLE) ×8 IMPLANT
STAPLER VISISTAT 35W (STAPLE) IMPLANT
SUCTION FRAZIER HANDLE 10FR (MISCELLANEOUS)
SUCTION TUBE FRAZIER 10FR DISP (MISCELLANEOUS) IMPLANT
SUT ETHIBOND 2 V 37 (SUTURE) ×2 IMPLANT
SUT ETHILON 3 0 PS 1 (SUTURE) ×6 IMPLANT
SUT VIC AB 0 CT1 27 (SUTURE) ×3
SUT VIC AB 0 CT1 27XBRD ANBCTR (SUTURE) ×1 IMPLANT
SUT VIC AB 2-0 CT1 27 (SUTURE) ×6
SUT VIC AB 2-0 CT1 TAPERPNT 27 (SUTURE) ×1 IMPLANT
SYR CONTROL 10ML LL (SYRINGE) IMPLANT
UNDERPAD 30X30 (UNDERPADS AND DIAPERS) ×3 IMPLANT
WASHER 3.5MM (Orthopedic Implant) ×2 IMPLANT
WATER STERILE IRR 1000ML POUR (IV SOLUTION) ×3 IMPLANT

## 2017-10-27 NOTE — Progress Notes (Signed)
RN paged Warren Lacy about pt becoming more restless and agitated. Per pts wife, pt had just been started on ativan 1mg  at home prior to arrival for ETOH w/d. Will await new orders and continue to monitor closely. Clint Bolder, RN 10/27/17

## 2017-10-27 NOTE — Discharge Instructions (Signed)

## 2017-10-27 NOTE — Transfer of Care (Signed)
Immediate Anesthesia Transfer of Care Note  Patient: [name]  Procedure(s) Performed: OPEN REDUCTION INTERNAL FIXATION (ORIF) LEFT OLECRANON (Left Elbow)  Patient Location: PACU  Anesthesia Type:General  Level of Consciousness: Patient remains intubated per anesthesia plan  Airway & Oxygen Therapy: Patient remains intubated per anesthesia plan and Patient placed on Ventilator (see vital sign flow sheet for setting)  Post-op Assessment: Report given to RN, Post -op Vital signs reviewed and stable and Patient moving all extremities X 4  Post vital signs: Reviewed and stable  Last Vitals:  Vitals Value Taken Time  BP 131/80 10/27/2017  3:05 PM  Temp    Pulse 75 10/27/2017  3:11 PM  Resp 18 10/27/2017  3:11 PM  SpO2 100 % 10/27/2017  3:11 PM  Vitals shown include unvalidated device data.  Last Pain:  Vitals:   10/27/17 1119  TempSrc:   PainSc: 0-No pain         Complications: No apparent anesthesia complications

## 2017-10-27 NOTE — Consult Note (Addendum)
NAME:  [name], MRN:  [id number], DOB:  Mar 13, 1945, LOS: 0 ADMISSION DATE:  10/27/2017, CONSULTATION DATE:  10/17 REFERRING MD:  Dr. Erlinda Hong, CHIEF COMPLAINT:  Fall   Brief History   72 y/o M who presented to [facility] on 10/17 for planned repair of left olecranon fracture.  The patient suffered a fall at home on 10/10 with left arm pain.  He was reportedly intoxicated at time of fall.  Post-operatively, he was unable to extubate to due intermittent agitation, sedation and concerns for shoulder nerve block related diaphragmatic effects.    Past Medical History  ETOH abuse with cirrhosis of the liver, ETOH related dementia, CAD/PVD, HTN, HLD, murmur, paroxysmal atrial tachycardia, DCM (LVEF 45-50% 2017),GERD, hiatal hernia, HOH, frequent falls  Significant Hospital Events   10/17  Admit for repair of left olecranon fracture   Consults: date of consult/date signed off & final recs:  10/17  PCCM   Procedures (surgical and bedside):  10/17  ORIF of left olecranon avulsion fracture  Significant Diagnostic Tests:  10/11 L Elbow XR >> acute intra-articular fracture through the olecranon with extension into the elbow joint and joint effusion   Micro Data:    Antimicrobials:   Ancef 10/17 (OR)  Subjective:  RN reports pt on 0.7 of precedex with intermittent periods of agitation.  Objective   Blood pressure 101/85, pulse 89, temperature (!) 97.3 F (36.3 C), resp. rate (!) 27, height 5\' 9"  (1.753 m), weight 85.7 kg, SpO2 100 %.    Vent Mode: PRVC FiO2 (%):  [70 %] 70 % Set Rate:  [18 bmp] 18 bmp Vt Set:  [560 mL] 560 mL PEEP:  [5 cmH20] 5 cmH20 Plateau Pressure:  [23 cmH20] 23 cmH20   Intake/Output Summary (Last 24 hours) at 10/27/2017 1533 Last data filed at 10/27/2017 1511 Gross per 24 hour  Intake 1220.83 ml  Output 50 ml  Net 1170.83 ml   Filed Weights   10/27/17 1123  Weight: 85.7 kg    Examination: General: chronically ill appearing male lying in bed on vent HENT:  ETT, MM pink/moist, raised fatty/soft area to left neck, difficult to assess right side due to sling Lungs: even/non-labored on vent, diminished on right, clear on left  Cardiovascular: s1s2 rrr, SR on monitor   Abdomen: obese/soft, bsx4 active  Extremities: warm/dry, no edema, BLE SCD's, left arm in sling Neuro: sedate, follows commands appropriately / easily re-directed  Resolved Hospital Problem list     Assessment & Plan:   Acute Respiratory Insufficiency - concern left supraclavicular block may have affect on the diaphragm for up to 18 hours, no evidence of elevation on CXR but film is rotated  P: PRVC 8 cc/kg  WUA / SBT in am with plan for extubation  Assess CXR now and in am  Follow up ABG in one hour with adjustments as appropriate PRN albuterol with hx of smoking    Acute Metabolic Encephalopathy  - ? Component of ETOH withdraw P: Precedex for sedation given ETOH  PRN fentanyl for pain    ETOH Abuse  - unclear last ETOH use, typically a bottle of wine per day P: Monitor for withdrawal symptoms  Precedex as above   Left Olecranon Fracture s/p ORIF (10/17) P: Per Ortho    Hx CAD (10% narrowing of proximal RCA), DCM (LVEF 55-60%), HTN, HLD - followed by Dr. Radford Pax  P: Hold home Toprol, consider restart in am   Assess labs, EKG post-op to ensure no intraoperative  cardiac event   Hx Depression, Chronic Pain P: Hold home flexeril, cymbalta, oxycodone, trazodone   Former Smoker P: Consider outpatient COPD evaluation   Disposition / Summary of Today's Plan 10/27/17   Admit to ICU for overnight observation on vent.  On ortho service.  Plan for SBT/WUA in am.      Diet: NPO Pain/Anxiety/Delirium protocol (if indicated): Precedex, PRN fentanyl  VAP protocol (if indicated): in place  DVT prophylaxis: SCD's  GI prophylaxis: Pepcid  Hyperglycemia protocol: n/a  Mobility: BR Code Status: Full Code  Family Communication: No family available at time of  evaluation.    Labs   CBC: Recent Labs  Lab 10/21/17 1807 10/27/17 1050  WBC 4.9  --   NEUTROABS 3.3  --   HGB 11.7* 10.9*  HCT 35.3* 32.0*  MCV 84.2  --   PLT 83*  --     Basic Metabolic Panel: Recent Labs  Lab 10/21/17 1807 10/27/17 1050  NA 129* 134*  K 3.8 3.1*  CL 89*  --   CO2 20*  --   GLUCOSE 93 98  BUN <5*  --   CREATININE 0.77  --   CALCIUM 8.4*  --    GFR: Estimated Creatinine Clearance: 91.9 mL/min (by C-G formula based on SCr of 0.77 mg/dL). Recent Labs  Lab 10/21/17 1807  WBC 4.9    Liver Function Tests: Recent Labs  Lab 10/21/17 1807  AST 72*  ALT 32  ALKPHOS 99  BILITOT 0.8  PROT 6.8  ALBUMIN 4.1   No results for input(s): LIPASE, AMYLASE in the last 168 hours. No results for input(s): AMMONIA in the last 168 hours.  ABG    Component Value Date/Time   PHART 7.449 12/26/2009 1920   PCO2ART 25.3 (L) 12/26/2009 1920   PO2ART 73.0 (L) 12/26/2009 1920   HCO3 17.6 (L) 12/26/2009 1920   TCO2 22 11/02/2011 1022   ACIDBASEDEF 5.0 (H) 12/26/2009 1920   O2SAT 96.0 12/26/2009 1920     Coagulation Profile: No results for input(s): INR, PROTIME in the last 168 hours.  Cardiac Enzymes: No results for input(s): CKTOTAL, CKMB, CKMBINDEX, TROPONINI in the last 168 hours.  HbA1C: Hgb A1c MFr Bld  Date/Time Value Ref Range Status  03/03/2012 12:00 PM 5.6 4.6 - 6.5 % Final    Comment:    Glycemic Control Guidelines for People with Diabetes:Non Diabetic:  <6%Goal of Therapy: <7%Additional Action Suggested:  >8%     CBG: Recent Labs  Lab 10/21/17 1737  GLUCAP 103*    Admitting History of Present Illness.   72 y/o M who presented to [facility] on 10/17 for planned repair of left olecranon fracture.  The patient suffered a fall at home on 10/10 with left arm pain.  He was reportedly intoxicated at time of fall. He typically drinks a bottle of wine per day when he drinks.  No documentation noted of his last drink. No family available at this time.  He underwent ORIF as planned on 10/17 by Dr. Erlinda Hong.  Post-operatively, he was unable to extubate to due intermittent agitation, sedation and concerns for supraclavicular nerve block related diaphragmatic effects.  PCCM consulted for ICU evaluation, ventilator assistance.   Review of Systems:   Unable to complete as patient is sedate on mechanical ventilation.   Past Medical History  He,  has a past medical history of Adenomatous polyp of colon (2006), Alcohol-induced persisting dementia ([place]), Anemia (2012), Anxiety, Arthritis, Atrial tachycardia, paroxysmal ([place]) (04/07/2015), BPH (benign prostatic  hypertrophy) (5/99), Chronic bronchitis ([place]), Chronic nausea, Cirrhosis of liver ([place]), Colon polyp, DCM (dilated cardiomyopathy) ([place]), Depression, Dyspnea, Falls frequently, GERD (gastroesophageal reflux disease) (07/31/04), H/O hiatal hernia, Hard of hearing, Heart murmur, Hernia, umbilical, History of blood transfusion (2012), HLD (hyperlipidemia) (5/99), HTN (hypertension) (5/99), Memory loss, Mild CAD, PAC (premature atrial contraction) (04/07/2015), Peripheral vascular disease ([place]), Pneumonia (1996), Pollen allergy, PONV (postoperative nausea and vomiting), Seasonal allergies, and Wears glasses.   Surgical History    Past Surgical History:  Procedure Laterality Date  . BACK SURGERY    . CARDIAC CATHETERIZATION N/A 05/23/2015   Procedure: Left Heart Cath and Coronary Angiography;  Surgeon: Troy Sine, MD;  Location: [place] [facility];  Service: Cardiovascular;  Laterality: N/A;  . CATARACT EXTRACTION W/ INTRAOCULAR LENS  IMPLANT, BILATERAL Bilateral 10/2002  . COLONOSCOPY  07/31/04   multiple polyps; repeat in 3 years  . DUPUYTREN CONTRACTURE RELEASE Left   . ETT myoview  03/09/07   nml EF 66%  . FRACTURE SURGERY    . HEMORRHOID SURGERY     "cauterized a long time ago" (11/02/2011)  . hosp CP R/O'D 2/24  03/08/07  . neck MRI  6/04   C5-6 disc abnormality  . ORIF HUMERUS FRACTURE  11/04/2011    Procedure: OPEN REDUCTION INTERNAL FIXATION (ORIF) PROXIMAL HUMERUS FRACTURE;  Surgeon: Rozanna Box, MD;  Location: [place];  Service: Orthopedics;  Laterality: Left;  . POSTERIOR CERVICAL FUSION/FORAMINOTOMY N/A 03/21/2012   Procedure: POSTERIOR CERVICAL FUSION/FORAMINOTOMY LEVEL ONE;  Surgeon: Charlie Pitter, MD;  Location: [place];  Service: Neurosurgery;  Laterality: N/A;  POSTERIOR CERVICAL ONE TO CERVICAL TWO FUSION WITH ILIAC CREST GRAFT AND LATERAL MASS SCREWS   . TONSILLECTOMY     "I was young" (11/02/2011)  . TOTAL HIP ARTHROPLASTY Left 10/24/2016   Procedure: TOTAL HIP ARTHROPLASTY ANTERIOR APPROACH;  Surgeon: Rod Can, MD;  Location: [facility];  Service: Orthopedics;  Laterality: Left;  . UPPER GASTROINTESTINAL ENDOSCOPY       Social History   Social History   Socioeconomic History  . Marital status: Married    Spouse name: Not on file  . Number of children: 3  . Years of education: Not on file  . Highest education level: Not on file  Occupational History  . Occupation: Academic librarian: [employer]  Social Needs  . Financial resource strain: Not on file  . Food insecurity:    Worry: Not on file    Inability: Not on file  . Transportation needs:    Medical: Not on file    Non-medical: Not on file  Tobacco Use  . Smoking status: Former Smoker    Types: Pipe  . Smokeless tobacco: Never Used  . Tobacco comment: 05/17/2016 "stopped in the late 1990s"  Substance and Sexual Activity  . Alcohol use: Yes    Alcohol/week: 28.0 standard drinks    Types: 28 Glasses of wine per week    Comment: last drink 10/22/17  . Drug use: No  . Sexual activity: Never  Lifestyle  . Physical activity:    Days per week: Not on file    Minutes per session: Not on file  . Stress: Not on file  Relationships  . Social connections:    Talks on phone: Not on file    Gets together: Not on file    Attends religious service: Not on file    Active member of club or  organization: Not on  file    Attends meetings of clubs or organizations: Not on file    Relationship status: Not on file  . Intimate partner violence:    Fear of current or ex partner: Not on file    Emotionally abused: Not on file    Physically abused: Not on file    Forced sexual activity: Not on file  Other Topics Concern  . Not on file  Social History Narrative   Married (remarried), lives with wife; 3 children, 1 at home.    [employer] - mixes colors       Pt revised designated party release form Oather [place], wife [phone] - can leave msg.     Allen Norris 03/04/10.        Would desire CPR.  Would not want prolonged life support if futile.  ,  reports that he has quit smoking. His smoking use included pipe. He has never used smokeless tobacco. He reports that he drinks about 28.0 standard drinks of alcohol per week. He reports that he does not use drugs.   Family History   His family history includes Alcohol abuse in his brother; Depression in his mother; Diabetes in his father; Heart disease in his brother and father; Liver disease in his brother; Stroke in his father.   Allergies Allergies  Allergen Reactions  . Nifedipine Swelling    11/02/2011 pt denies this allergy (??)  . Bee Venom Swelling     Home Medications  Prior to Admission medications   Medication Sig Start Date End Date Taking? Authorizing Provider  cetirizine (ZYRTEC) 10 MG tablet Take 1 tablet (10 mg total) by mouth daily. 10/03/17  Yes Elby Beck, FNP  Cyanocobalamin (VITAMIN B-12 PO) Take 1 tablet by mouth daily.   Yes [provider]  cyclobenzaprine (FLEXERIL) 10 MG tablet Take 0.5-1 tablets (5-10 mg total) by mouth at bedtime as needed for muscle spasms. 10/03/17  Yes Elby Beck, FNP  DULoxetine (CYMBALTA) 30 MG capsule Take 1 capsule (30 mg total) by mouth daily. 10/03/17  Yes Elby Beck, FNP  ferrous sulfate 325 (65 FE) MG tablet Take 325 mg by mouth daily as  needed (low iron).    Yes [provider]  fluticasone (FLONASE) 50 MCG/ACT nasal spray Place 1 spray into both nostrils 2 (two) times daily as needed for allergies. 06/22/17  Yes Elby Beck, FNP  folic acid (FOLVITE) 1 MG tablet Take 1 tablet (1 mg total) by mouth daily. 10/31/16  Yes Isaac Bliss, Rayford Halsted, MD  gabapentin (NEURONTIN) 100 MG capsule TAKE 2 CAPSULES TWICE A DAY Patient taking differently: Take 200 mg by mouth 2 (two) times daily.  10/17/17  Yes Elby Beck, FNP  ibuprofen (ADVIL,MOTRIN) 400 MG tablet Take 1 tablet (400 mg total) by mouth every 6 (six) hours as needed for moderate pain (neck pain). 04/11/17  Yes Adhikari, Tamsen Meek, MD  KLOR-CON M15 15 MEQ tablet Take 15 mEq by mouth daily as needed (potasssium).  07/08/17  Yes [provider]  lidocaine (LIDODERM) 5 % Place 1 patch onto the skin daily. Remove & Discard patch within 12 hours or as directed by MD Patient taking differently: Place 1 patch onto the skin daily as needed (pain). Remove & Discard patch within 12 hours or as directed by MD 05/11/16  Yes Lucille Passy, MD  LORazepam (ATIVAN) 1 MG tablet Take 1 tablet (1 mg total) by mouth 2 (two) times daily as needed for  anxiety. 10/26/17  Yes Elby Beck, FNP  magnesium oxide (MAG-OX) 400 (241.3 Mg) MG tablet Take 1 tablet (400 mg total) by mouth daily. 04/12/17  Yes Shelly Coss, MD  metoprolol succinate (TOPROL-XL) 25 MG 24 hr tablet Take 0.5 tablets (12.5 mg total) by mouth daily. 08/25/17  Yes Copland, Frederico Hamman, MD  Multiple Vitamin (MULTIVITAMIN WITH MINERALS) TABS tablet Take 1 tablet by mouth daily. 10/31/16  Yes Isaac Bliss, Rayford Halsted, MD  oxyCODONE (OXY IR/ROXICODONE) 5 MG immediate release tablet Take 1-2 tablets (5-10 mg total) by mouth 2 (two) times daily as needed. Patient taking differently: Take 5-10 mg by mouth 2 (two) times daily as needed for moderate pain.  10/26/17  Yes Leandrew Koyanagi, MD  pantoprazole (PROTONIX) 40 MG  tablet Take 1 tablet (40 mg total) by mouth daily. 08/25/17  Yes Copland, Frederico Hamman, MD  promethazine (PHENERGAN) 25 MG tablet Take 25 mg by mouth every 6 (six) hours as needed for nausea or vomiting.   Yes [provider]  Thiamine HCl (VITAMIN B-1) 100 MG tablet Take 100 mg by mouth daily. Reported on 02/17/2015   Yes [provider]  traZODone (DESYREL) 50 MG tablet Take 50 mg by mouth at bedtime.    Yes [provider]  methocarbamol (ROBAXIN) 750 MG tablet Take 1 tablet (750 mg total) by mouth 2 (two) times daily as needed for muscle spasms. 10/27/17   Leandrew Koyanagi, MD  ondansetron (ZOFRAN) 4 MG tablet Take 1-2 tablets (4-8 mg total) by mouth every 8 (eight) hours as needed for nausea or vomiting. 10/27/17   Leandrew Koyanagi, MD  oxyCODONE (OXY IR/ROXICODONE) 5 MG immediate release tablet Take 1-3 tablets (5-15 mg total) by mouth every 4 (four) hours as needed. 10/27/17   Leandrew Koyanagi, MD  oxyCODONE (OXYCONTIN) 10 mg 12 hr tablet Take 1 tablet (10 mg total) by mouth every 12 (twelve) hours for 3 days. 10/27/17 10/30/17  Leandrew Koyanagi, MD  potassium chloride SA (K-DUR,KLOR-CON) 20 MEQ tablet Take 1 tablet (20 mEq total) by mouth daily. Patient not taking: Reported on 10/27/2017 04/11/17 05/11/17  Shelly Coss, MD  promethazine (PHENERGAN) 25 MG tablet Take 1 tablet (25 mg total) by mouth every 6 (six) hours as needed for nausea. 10/27/17   Leandrew Koyanagi, MD  senna-docusate (SENOKOT S) 8.6-50 MG tablet Take 1 tablet by mouth at bedtime as needed. 10/27/17   Leandrew Koyanagi, MD     Critical care time: 76 minutes     Noe Gens, NP-C [facility] Pgr: [phone] or if no answer [phone] 10/27/2017, 3:33 PM

## 2017-10-27 NOTE — Progress Notes (Signed)
Patient ID: [name], male   DOB: 13-Feb-1945, 72 y.o.   MRN: [id number]  PCCM attending critical care note:  Background history:  A 72 year old man with a history of alcohol abuse, cirrhosis alcohol-related dementia and diffuse vascular disease as well as dilated cardiomyopathy.  Underwent planned repair of left olecranon fracture.  Unable to be extubated at the end of the case due to agitation and possible diaphragmatic paralysis to the shoulder nerve block.  Anticipated duration of block may be up to 18 hours.  Subjective:   Objective:  I have personally reviewed vital signs, intake and output and ventilator settings.  Physical Exam  Constitutional: He appears well-developed. He is sedated and intubated.  HENT:  Head: Normocephalic and atraumatic.  Neck: No JVD present. No tracheal deviation present.  Cardiovascular: Normal rate and normal heart sounds.  Pulmonary/Chest: Effort normal and breath sounds normal. He is intubated.  No ventilator asynchrony.  Abdominal: Soft. Bowel sounds are normal.  Musculoskeletal:  R shoulder sling.  Neurological:  Sedated but easily agitated, moves all limbs.  Skin: Skin is warm and dry. Capillary refill takes less than 2 seconds.     Ancillary testing personally reviewed:  Postintubation chest film today shows markedly rotated film with baseline interstitial lung disease.  Tube is in good position.  Laboratory investigations shows leukopenia, mild anemia, hyponatremia and hyper hypokalemia.  Assessment:  Patient is critically ill due to acute hypoxic respiratory failure related to postoperative agitation requiring sedation and possible diaphragmatic paralysis.  We will continue full mechanical ventilatory support.  Titrate oxygen to maintain saturations greater than 92%.  Does well overnight will anticipate trial of spontaneous breathing and possible extubation in the morning.  Hypovolemic hyponatremia.  Patient is on IV fluids.   Will monitor electrolytes.  Hypokalemia has been replaced.  Agitation in the context of delirium and possible alcohol withdrawal.  Continue strategy of dexmedetomidine for sedation and fentanyl for pain control.  CRITICAL CARE Performed by: Kipp Brood   Total critical care time: 30 minutes  Critical care time was exclusive of separately billable procedures and treating other patients.  Critical care was necessary to treat or prevent imminent or life-threatening deterioration.  Critical care was time spent personally by me on the following activities: development of treatment plan with patient and/or surrogate as well as nursing, discussions with consultants, evaluation of patient's response to treatment, examination of patient, obtaining history from patient or surrogate, ordering and performing treatments and interventions, ordering and review of laboratory studies, ordering and review of radiographic studies, pulse oximetry, re-evaluation of patient's condition and participation in multidisciplinary rounds.  Kipp Brood, MD [facility]  Pager: [phone] Mobile: [phone] After hours: [phone].

## 2017-10-27 NOTE — H&P (Signed)
PREOPERATIVE H&P  Chief Complaint: left olecranon fracture  HPI: [name] is a 71 y.o. male who presents for surgical treatment of left olecranon fracture.  He denies any changes in medical history.  Past Medical History:  Diagnosis Date  . Adenomatous polyp of colon 2006  . Alcohol-induced persisting dementia ([place])   . Anemia 2012  . Anxiety   . Arthritis    "left arm/shoulder" (05/17/2016)  . Atrial tachycardia, paroxysmal ([place]) 04/07/2015  . BPH (benign prostatic hypertrophy) 5/99  . Chronic bronchitis ([place])    "q year or q other year" (11/02/2011)  . Chronic nausea    "a few days/week" (05/17/2016)  . Cirrhosis of liver ([place])   . Colon polyp   . DCM (dilated cardiomyopathy) ([place])    EF 45-50% by echo and cath 2017  . Depression    Hx of  . Dyspnea    interm.  . Falls frequently    "daily lately; legs just give out" (11/02/2011; 05/17/2016)  . GERD (gastroesophageal reflux disease) 07/31/04   gastritis   . H/O hiatal hernia   . Hard of hearing   . Heart murmur   . Hernia, umbilical    "unrepaired" (11/02/2011; 05/17/2016)  . History of blood transfusion 2012  . HLD (hyperlipidemia) 5/99  . HTN (hypertension) 5/99  . Memory loss    "recently has been forgetting alot; maybe related to the alcohol" (11/02/2011; 05/17/2016)  . Mild CAD    10% RCA  . PAC (premature atrial contraction) 04/07/2015  . Peripheral vascular disease ([place])   . Pneumonia 1996   hosp  . Pollen allergy    pollen/ragweed  . PONV (postoperative nausea and vomiting)   . Seasonal allergies   . Wears glasses    Past Surgical History:  Procedure Laterality Date  . BACK SURGERY    . CARDIAC CATHETERIZATION N/A 05/23/2015   Procedure: Left Heart Cath and Coronary Angiography;  Surgeon: Troy Sine, MD;  Location: [facility];  Service: Cardiovascular;  Laterality: N/A;  . CATARACT EXTRACTION W/ INTRAOCULAR LENS  IMPLANT, BILATERAL Bilateral 10/2002  . COLONOSCOPY  07/31/04   multiple  polyps; repeat in 3 years  . DUPUYTREN CONTRACTURE RELEASE Left   . ETT myoview  03/09/07   nml EF 66%  . FRACTURE SURGERY    . HEMORRHOID SURGERY     "cauterized a long time ago" (11/02/2011)  . hosp CP R/O'D 2/24  03/08/07  . neck MRI  6/04   C5-6 disc abnormality  . ORIF HUMERUS FRACTURE  11/04/2011   Procedure: OPEN REDUCTION INTERNAL FIXATION (ORIF) PROXIMAL HUMERUS FRACTURE;  Surgeon: Rozanna Box, MD;  Location: [facility];  Service: Orthopedics;  Laterality: Left;  . POSTERIOR CERVICAL FUSION/FORAMINOTOMY N/A 03/21/2012   Procedure: POSTERIOR CERVICAL FUSION/FORAMINOTOMY LEVEL ONE;  Surgeon: Charlie Pitter, MD;  Location: [facility];  Service: Neurosurgery;  Laterality: N/A;  POSTERIOR CERVICAL ONE TO CERVICAL TWO FUSION WITH ILIAC CREST GRAFT AND LATERAL MASS SCREWS   . TONSILLECTOMY     "I was young" (11/02/2011)  . TOTAL HIP ARTHROPLASTY Left 10/24/2016   Procedure: TOTAL HIP ARTHROPLASTY ANTERIOR APPROACH;  Surgeon: Rod Can, MD;  Location: [facility];  Service: Orthopedics;  Laterality: Left;  . UPPER GASTROINTESTINAL ENDOSCOPY     Social History   Socioeconomic History  . Marital status: Married    Spouse name: Not on file  . Number of children: 3  . Years of education: Not on file  .  Highest education level: Not on file  Occupational History  . Occupation: Academic librarian: [employer]  Social Needs  . Financial resource strain: Not on file  . Food insecurity:    Worry: Not on file    Inability: Not on file  . Transportation needs:    Medical: Not on file    Non-medical: Not on file  Tobacco Use  . Smoking status: Former Smoker    Types: Pipe  . Smokeless tobacco: Never Used  . Tobacco comment: 05/17/2016 "stopped in the late 1990s"  Substance and Sexual Activity  . Alcohol use: Yes    Alcohol/week: 28.0 standard drinks    Types: 28 Glasses of wine per week    Comment: last drink 10/22/17  . Drug use: No  . Sexual activity: Never  Lifestyle    . Physical activity:    Days per week: Not on file    Minutes per session: Not on file  . Stress: Not on file  Relationships  . Social connections:    Talks on phone: Not on file    Gets together: Not on file    Attends religious service: Not on file    Active member of club or organization: Not on file    Attends meetings of clubs or organizations: Not on file    Relationship status: Not on file  Other Topics Concern  . Not on file  Social History Narrative   Married (remarried), lives with wife; 3 children, 1 at home.    [employer] - mixes colors       Pt revised designated party release form Riccardo Holeman, wife [phone] - can leave msg.     Allen Norris 03/04/10.        Would desire CPR.  Would not want prolonged life support if futile.   Family History  Problem Relation Age of Onset  . Heart disease Father   . Diabetes Father   . Stroke Father   . Depression Mother   . Heart disease Brother   . Alcohol abuse Brother   . Liver disease Brother    Allergies  Allergen Reactions  . Nifedipine Swelling    11/02/2011 pt denies this allergy (??)   Prior to Admission medications   Medication Sig Start Date End Date Taking? Authorizing Provider  cetirizine (ZYRTEC) 10 MG tablet Take 1 tablet (10 mg total) by mouth daily. 10/03/17   Elby Beck, FNP  Cyanocobalamin (VITAMIN B-12 PO) Take 1 tablet by mouth daily.    [provider]  cyclobenzaprine (FLEXERIL) 10 MG tablet Take 0.5-1 tablets (5-10 mg total) by mouth at bedtime as needed for muscle spasms. 10/03/17   Elby Beck, FNP  DULoxetine (CYMBALTA) 30 MG capsule Take 1 capsule (30 mg total) by mouth daily. 10/03/17   Elby Beck, FNP  ferrous sulfate 325 (65 FE) MG tablet Take 325 mg by mouth daily with breakfast.    [provider]  fluticasone (FLONASE) 50 MCG/ACT nasal spray Place 1 spray into both nostrils 2 (two) times daily as needed for allergies. 06/22/17   Elby Beck, FNP  folic acid (FOLVITE) 1 MG tablet Take 1 tablet (1 mg total) by mouth daily. 10/31/16   Isaac Bliss, Rayford Halsted, MD  gabapentin (NEURONTIN) 100 MG capsule TAKE 2 CAPSULES TWICE A DAY 10/17/17   Elby Beck, FNP  ibuprofen (ADVIL,MOTRIN) 400 MG tablet Take 1 tablet (400 mg total) by  mouth every 6 (six) hours as needed for moderate pain (neck pain). 04/11/17   Shelly Coss, MD  KLOR-CON M15 15 MEQ tablet  07/08/17   [provider]  lidocaine (LIDODERM) 5 % Place 1 patch onto the skin daily. Remove & Discard patch within 12 hours or as directed by MD Patient taking differently: Place 1 patch onto the skin daily as needed (pain). Remove & Discard patch within 12 hours or as directed by MD 05/11/16   Lucille Passy, MD  LORazepam (ATIVAN) 1 MG tablet Take 1 tablet (1 mg total) by mouth 2 (two) times daily as needed for anxiety. 10/26/17   Elby Beck, FNP  magnesium oxide (MAG-OX) 400 (241.3 Mg) MG tablet Take 1 tablet (400 mg total) by mouth daily. 04/12/17   Shelly Coss, MD  metoprolol succinate (TOPROL-XL) 25 MG 24 hr tablet Take 0.5 tablets (12.5 mg total) by mouth daily. 08/25/17   Copland, Frederico Hamman, MD  Multiple Vitamin (MULTIVITAMIN WITH MINERALS) TABS tablet Take 1 tablet by mouth daily. 10/31/16   Isaac Bliss, Rayford Halsted, MD  oxyCODONE (OXY IR/ROXICODONE) 5 MG immediate release tablet Take 1-2 tablets (5-10 mg total) by mouth 2 (two) times daily as needed. 10/26/17   Leandrew Koyanagi, MD  pantoprazole (PROTONIX) 40 MG tablet Take 1 tablet (40 mg total) by mouth daily. 08/25/17   Copland, Frederico Hamman, MD  potassium chloride SA (K-DUR,KLOR-CON) 20 MEQ tablet Take 1 tablet (20 mEq total) by mouth daily. 04/11/17 05/11/17  Shelly Coss, MD  Thiamine HCl (VITAMIN B-1) 100 MG tablet Take 100 mg by mouth daily. Reported on 02/17/2015    [provider]  TRAZODONE HCL PO Take by mouth at bedtime.    [provider]     Positive ROS: All other systems  have been reviewed and were otherwise negative with the exception of those mentioned in the HPI and as above.  Physical Exam: General: Alert, no acute distress Cardiovascular: No pedal edema Respiratory: No cyanosis, no use of accessory musculature GI: abdomen soft Skin: No lesions in the area of chief complaint Neurologic: Sensation intact distally Psychiatric: Patient is competent for consent with normal mood and affect Lymphatic: no lymphedema  MUSCULOSKELETAL: exam stable  Assessment: left olecranon fracture  Plan: Plan for Procedure(s): OPEN REDUCTION INTERNAL FIXATION (ORIF) LEFT OLECRANON VS. TRICEPS ADVANCEMENT  The risks benefits and alternatives were discussed with the patient including but not limited to the risks of nonoperative treatment, versus surgical intervention including infection, bleeding, nerve injury,  blood clots, cardiopulmonary complications, morbidity, mortality, among others, and they were willing to proceed.   Eduard Roux, MD   10/27/2017 8:36 AM

## 2017-10-27 NOTE — Anesthesia Preprocedure Evaluation (Addendum)
Anesthesia Evaluation  Patient identified by MRN, date of birth, ID band Patient awake    Reviewed: Allergy & Precautions, NPO status , Patient's Chart, lab work & pertinent test results  History of Anesthesia Complications (+) PONV and history of anesthetic complications  Airway Mallampati: III  TM Distance: >3 FB Neck ROM: Full    Dental no notable dental hx.    Pulmonary former smoker,    Pulmonary exam normal breath sounds clear to auscultation       Cardiovascular hypertension, Pt. on home beta blockers + CAD and + Peripheral Vascular Disease  Normal cardiovascular exam Rhythm:Regular Rate:Normal  ECG: NSR, rate 87  ECHO: (2018) Left ventricle: The cavity size was normal. Wall thickness was normal. Systolic function was normal. The estimated ejection fraction was in the range of 55% to 60%.  CATH (2017) Prox RCA to Mid RCA lesion, 10% stenosed. There is mild left ventricular systolic dysfunction.  Mild diffuse hypokinesis with an overall ejection fraction of 45-50% without definitive focal segmental wall motion abnormalities. No significant coronary obstructive disease with minimal 10 percent narrowing in the proximal RCA, and otherwise normal coronary arteries.   Saw cardiologist Radford Pax)   Neuro/Psych PSYCHIATRIC DISORDERS Anxiety Depression Dementia negative neurological ROS     GI/Hepatic hiatal hernia, PUD, GERD  Medicated and Controlled,(+) Cirrhosis   ascites  substance abuse  alcohol use,   Endo/Other  negative endocrine ROS  Renal/GU negative Renal ROS     Musculoskeletal Motor and sensation intact to left hand   Abdominal   Peds  Hematology  (+) anemia , HLD Thrombocytopenia   Anesthesia Other Findings left olecranon fracture  Reproductive/Obstetrics                            Anesthesia Physical Anesthesia Plan  ASA: IV  Anesthesia Plan: General and Regional    Post-op Pain Management: GA combined w/ Regional for post-op pain   Induction: Intravenous  PONV Risk Score and Plan: 3 and Ondansetron, Dexamethasone, Midazolam and Treatment may vary due to age or medical condition  Airway Management Planned: Oral ETT  Additional Equipment:   Intra-op Plan:   Post-operative Plan: Extubation in OR  Informed Consent: I have reviewed the patients History and Physical, chart, labs and discussed the procedure including the risks, benefits and alternatives for the proposed anesthesia with the patient or authorized representative who has indicated his/her understanding and acceptance.   Dental advisory given  Plan Discussed with: CRNA  Anesthesia Plan Comments:        Anesthesia Quick Evaluation

## 2017-10-27 NOTE — Progress Notes (Signed)
RT called Elink to notify that pt is very desynchronized with the vent. Pt peak pressures are ranging from 30's to 40's. Pt extremely agitated and trying to pull out tube. MD notified this RT that she has ordered medications to help pt with this problem. If issue continues to occur to call back. RT will continue to monitor.

## 2017-10-27 NOTE — Op Note (Signed)
Date of Surgery: 10/27/2017  INDICATIONS: Mr. Granier is a 72 y.o.-year-old male with a left olecranon avulsion fracture;  The patient did consent to the procedure after discussion of the risks and benefits.  PREOPERATIVE DIAGNOSIS: Left olecranon avulsion fracture  POSTOPERATIVE DIAGNOSIS: Same.  PROCEDURE:  1. Open reduction internal fixation of left olecranon avulsion fracture 2. Suture of triceps tendon and repair back to proximal ulna  SURGEON: N. Eduard Roux, M.D.  ASSIST: Izola Price  ANESTHESIA:  general, regional  IV FLUIDS AND URINE: See anesthesia.  ESTIMATED BLOOD LOSS: minimal mL.  IMPLANTS: Biomet olecranon plate, #2 ethibond suture  DRAINS: none  COMPLICATIONS: None.  DESCRIPTION OF PROCEDURE: The patient was brought to the operating room and placed lateral on the operating table.  The patient had been signed prior to the procedure and this was documented. The patient had the anesthesia placed by the anesthesiologist.  A time-out was performed to confirm that this was the correct patient, site, side and location. The patient did receive antibiotics prior to the incision and was re-dosed during the procedure as needed at indicated intervals.  A tourniquet was placed.  The patient had the operative extremity prepped and draped in the standard surgical fashion.    An incision was made on the posterior aspect of the elbow.  Dissection was carried through the subcutaneous tissue.  Organized hematoma was removed.  Full-thickness flaps were elevated off of the triceps tendon.  The fracture site was encountered.  Subperiosteal elevation was performed of the proximal ulna.  Retractors were placed for exposure.  The fracture line was visualized.  The large avulsion fracture was identified.  I first advanced 4 parallel strands of #2 Ethibond suture of the triceps tendon in a Krakw fashion.  This was then used to pull the triceps tendon and the bony fragment back to the proximal  ulna.  The reduction was confirmed under any fluoroscopy.  A single K wire was then placed across the fracture for provisional fixation.  I then placed a precontoured olecranon plate directly on the ulnar aspect of the proximal ulna.  I then placed a nonlocking screw through the oblong hole in order to compress the fracture.  The screw was able to contour the plate down to the bone and compress the fracture.  I then placed 4 more locking screws in the proximal ulna through the plate each with excellent purchase.  The nonlocking screw was then removed.  I then remove the initial K wire in the fracture fragment remained reduced.  I then placed a single homerun screw through the proximal portion of the plate across the proximal fracture fragment and across the fracture.  Placement of the hardware was all confirmed under fluoroscopy.  The fracture reduction was adequate.  Given the characteristic of the fracture and the limited fixation proximally I felt that I had to supplement the fixation by essentially performing a triceps repair through bony tunnels.  Therefore I used a 2.0 millimeter drill bit to create a bony tunnel through the proximal ulna.  I then used a [place] suture passer to deliver the #2 Ethibond sutures in a figure 8 fashion.  The sutures were tensioned and tied down.  Final x-rays were taken.  Elbow range of motion showed movement of the proximal ulna as a unit.  The surgical wound was then thoroughly irrigated with 3 L normal saline and closed in layered fashion using 2-0 Vicryl and 3-0 nylon.  Sterile dressings were applied.  The arm  was placed in a long-arm splint.  Shoulder sling was placed.  Patient tolerated procedure well had no immediate complications.  POSTOPERATIVE PLAN: Patient will be nonweightbearing to the left upper extremity.  Azucena Cecil, MD [facility] [phone] 12:32 PM

## 2017-10-27 NOTE — Anesthesia Procedure Notes (Signed)
Procedure Name: Intubation Date/Time: 10/27/2017 12:48 PM Performed by: Wilburn Cornelia, CRNA Pre-anesthesia Checklist: Patient identified, Emergency Drugs available, Suction available, Patient being monitored and Timeout performed Patient Re-evaluated:Patient Re-evaluated prior to induction Oxygen Delivery Method: Circle system utilized Preoxygenation: Pre-oxygenation with 100% oxygen Induction Type: IV induction and Cricoid Pressure applied Ventilation: Oral airway inserted - appropriate to patient size and Mask ventilation without difficulty Laryngoscope Size: Mac and 4 Grade View: Grade III Tube size: 7.5 mm Number of attempts: 1 Airway Equipment and Method: Stylet Placement Confirmation: ETT inserted through vocal cords under direct vision,  positive ETCO2,  CO2 detector and breath sounds checked- equal and bilateral Secured at: 22 cm Tube secured with: Tape Dental Injury: Teeth and Oropharynx as per pre-operative assessment

## 2017-10-27 NOTE — Progress Notes (Signed)
RN paged [name] again. Pt more agitated and restless, unable to be redirected. Pulling at tubes and lines. Awaiting new orders. Clint Bolder, RN 10/27/17

## 2017-10-27 NOTE — Anesthesia Procedure Notes (Signed)
Anesthesia Regional Block: Supraclavicular block   Pre-Anesthetic Checklist: ,, timeout performed, Correct Patient, Correct Site, Correct Laterality, Correct Procedure, Correct Position, site marked, Risks and benefits discussed,  Surgical consent,  Pre-op evaluation,  At surgeon's request and post-op pain management  Laterality: Left  Prep: chloraprep       Needles:  Injection technique: Single-shot  Needle Type: Echogenic Stimulator Needle     Needle Length: 9cm  Needle Gauge: 21     Additional Needles:   Procedures:,,,, ultrasound used (permanent image in chart),,,,  Narrative:  Start time: 10/27/2017 12:20 PM End time: 10/27/2017 12:30 PM Injection made incrementally with aspirations every 5 mL.  Performed by: Personally  Anesthesiologist: Murvin Natal, MD  Additional Notes: Functioning IV was confirmed and monitors were applied.  A timeout was performed. Sterile prep, hand hygiene and sterile gloves were used. A 24mm 21ga Arrow echogenic stimulator needle was used. Negative aspiration and negative test dose prior to incremental administration of local anesthetic. The patient tolerated the procedure well.  Ultrasound guidance: relevent anatomy identified, needle position confirmed, local anesthetic spread visualized around nerve(s), vascular puncture avoided.  Image printed for medical record.

## 2017-10-27 NOTE — Progress Notes (Addendum)
Pt is combative attempting to hit and kick staff and trying to crawl out of bed. Pt unable to be redirected verbally. Pt biting the ET tube, dyssynchronous with the vent. Orders received for versed and 4 point restraints. Will continue to monitor closely. Clint Bolder, RN 10/27/17 10:23 PM

## 2017-10-28 ENCOUNTER — Inpatient Hospital Stay (HOSPITAL_COMMUNITY): Payer: Medicare Other

## 2017-10-28 ENCOUNTER — Encounter (HOSPITAL_COMMUNITY): Payer: Self-pay | Admitting: Orthopaedic Surgery

## 2017-10-28 ENCOUNTER — Other Ambulatory Visit: Payer: Self-pay

## 2017-10-28 DIAGNOSIS — J9600 Acute respiratory failure, unspecified whether with hypoxia or hypercapnia: Secondary | ICD-10-CM

## 2017-10-28 DIAGNOSIS — F10231 Alcohol dependence with withdrawal delirium: Secondary | ICD-10-CM

## 2017-10-28 DIAGNOSIS — G9340 Encephalopathy, unspecified: Secondary | ICD-10-CM

## 2017-10-28 LAB — BASIC METABOLIC PANEL
Anion gap: 13 (ref 5–15)
BUN: 19 mg/dL (ref 8–23)
CALCIUM: 8 mg/dL — AB (ref 8.9–10.3)
CO2: 23 mmol/L (ref 22–32)
CREATININE: 1.08 mg/dL (ref 0.61–1.24)
Chloride: 96 mmol/L — ABNORMAL LOW (ref 98–111)
GFR calc Af Amer: >60 mL/min (ref >60)
GLUCOSE: 185 mg/dL — AB (ref 70–99)
POTASSIUM: 3.5 mmol/L (ref 3.5–5.1)
SODIUM: 132 mmol/L — AB (ref 135–145)

## 2017-10-28 LAB — GLUCOSE, CAPILLARY
GLUCOSE-CAPILLARY: 132 mg/dL — AB (ref 70–99)
GLUCOSE-CAPILLARY: 146 mg/dL — AB (ref 70–99)
Glucose-Capillary: 166 mg/dL — ABNORMAL HIGH (ref 70–99)
Glucose-Capillary: 197 mg/dL — ABNORMAL HIGH (ref 70–99)
Glucose-Capillary: 142 mg/dL — ABNORMAL HIGH (ref 70–99)

## 2017-10-28 LAB — CBC
HCT: 32.4 % — ABNORMAL LOW (ref 39.0–52.0)
Hemoglobin: 10.7 g/dL — ABNORMAL LOW (ref 13.0–17.0)
MCH: 28.3 pg (ref 26.0–34.0)
MCHC: 33 g/dL (ref 30.0–36.0)
MCV: 85.7 fL (ref 80.0–100.0)
PLATELETS: 202 10*3/uL (ref 150–400)
RBC: 3.78 MIL/uL — AB (ref 4.22–5.81)
RDW: 15.7 % — AB (ref 11.5–15.5)
WBC: 5.5 10*3/uL (ref 4.0–10.5)
nRBC: 0 % (ref 0.0–0.2)

## 2017-10-28 LAB — CREATININE, SERUM
CREATININE: 1.15 mg/dL (ref 0.61–1.24)
GFR calc Af Amer: >60 mL/min (ref >60)
GFR calc non Af Amer: >60 mL/min (ref >60)

## 2017-10-28 LAB — TROPONIN I: TROPONIN I: 0.03 ng/mL — AB (ref <0.03)

## 2017-10-28 MED ORDER — MIDAZOLAM HCL 2 MG/2ML IJ SOLN
1.0000 mg | INTRAMUSCULAR | Status: DC | PRN
  Administered 2017-10-28 (×3): 2 mg via INTRAVENOUS
  Filled 2017-10-28 (×4): qty 2

## 2017-10-28 MED ORDER — LORAZEPAM 2 MG/ML PO CONC: 1.0000 mg | Freq: Two times a day (BID) | ORAL | Status: DC

## 2017-10-28 MED ORDER — LORAZEPAM 2 MG/ML IJ SOLN
1.0000 mg | INTRAMUSCULAR | Status: DC | PRN
  Administered 2017-10-28 – 2017-11-07 (×37): 2 mg via INTRAVENOUS
  Administered 2017-11-09: 1 mg via INTRAVENOUS
  Administered 2017-11-09 – 2017-11-11 (×5): 2 mg via INTRAVENOUS
  Filled 2017-10-28 (×45): qty 1

## 2017-10-28 MED ORDER — PRO-STAT SUGAR FREE PO LIQD
30.0000 mL | Freq: Two times a day (BID) | ORAL | Status: DC
  Administered 2017-10-28 – 2017-11-04 (×15): 30 mL
  Filled 2017-10-28 (×15): qty 30

## 2017-10-28 MED ORDER — VITAL HIGH PROTEIN PO LIQD
1000.0000 mL | ORAL | Status: DC
  Administered 2017-10-28 – 2017-11-04 (×6): 1000 mL

## 2017-10-28 MED ORDER — POTASSIUM CHLORIDE 20 MEQ/15ML (10%) PO SOLN
40.0000 meq | Freq: Once | ORAL | Status: AC
  Administered 2017-10-28: 40 meq
  Filled 2017-10-28: qty 30

## 2017-10-28 MED ORDER — FOLIC ACID 1 MG PO TABS
1.0000 mg | ORAL_TABLET | Freq: Every day | ORAL | Status: DC
  Administered 2017-10-28 – 2017-11-06 (×10): 1 mg
  Filled 2017-10-28 (×11): qty 1

## 2017-10-28 MED ORDER — VITAMIN B-1 100 MG PO TABS
100.0000 mg | ORAL_TABLET | Freq: Every day | ORAL | Status: DC
  Administered 2017-10-28 – 2017-11-06 (×10): 100 mg
  Filled 2017-10-28 (×11): qty 1

## 2017-10-28 MED ORDER — FAMOTIDINE 20 MG PO TABS
20.0000 mg | ORAL_TABLET | Freq: Two times a day (BID) | ORAL | Status: DC
  Administered 2017-10-28 – 2017-11-06 (×17): 20 mg
  Filled 2017-10-28 (×17): qty 1

## 2017-10-28 MED ORDER — LORAZEPAM 1 MG PO TABS
1.0000 mg | ORAL_TABLET | Freq: Two times a day (BID) | ORAL | Status: DC
  Administered 2017-10-28 – 2017-10-29 (×3): 1 mg
  Filled 2017-10-28 (×3): qty 1

## 2017-10-28 NOTE — Progress Notes (Signed)
Subjective: 1 Day Post-Op Procedure(s) (LRB): OPEN REDUCTION INTERNAL FIXATION (ORIF) LEFT OLECRANON (Left) Patient intubated and resting comfortably.  Per overnight notes, he has been sedated and on 3 point restraints due to extreme agitation.    Objective: Vital signs in last 24 hours: Temp:  [97.3 F (36.3 C)-98.8 F (37.1 C)] 98.2 F (36.8 C) (10/18 0352) Pulse Rate:  [47-109] 74 (10/18 0720) Resp:  [13-27] 18 (10/18 0720) BP: (101-209)/(56-183) 115/68 (10/18 0720) SpO2:  [92 %-100 %] 99 % (10/18 0721) FiO2 (%):  [30 %-70 %] 30 % (10/18 0721) Weight:  [85.7 kg-90.5 kg] 90.5 kg (10/17 1658)  Intake/Output from previous day: 10/17 0701 - 10/18 0700 In: 2597.2 [I.V.:2227.2; NG/GT:120; IV Piggyback:250] Out: 650 [Urine:600; Blood:50] Intake/Output this shift: No intake/output data recorded.  Recent Labs    10/27/17 1050 10/27/17 1722 10/27/17 2259 10/28/17 0507  HGB 10.9* 10.0* 10.3* 10.7*   Recent Labs    10/27/17 2259 10/28/17 0507  WBC 4.3 5.5  RBC 3.66* 3.78*  HCT 31.9* 32.4*  PLT 192 202   Recent Labs    10/27/17 1722 10/27/17 2259 10/28/17 0507  NA 134*  --  132*  K 2.9*  --  3.5  CL 93*  --  96*  CO2 24  --  23  BUN 20  --  19  CREATININE 1.08 1.15 1.08  GLUCOSE 112*  --  185*  CALCIUM 8.2*  --  8.0*   No results for input(s): LABPT, INR in the last 72 hours.  Intact pulses distally Incision: dressing C/D/I  Sling in place    Assessment/Plan: 1 Day Post-Op Procedure(s) (LRB): OPEN REDUCTION INTERNAL FIXATION (ORIF) LEFT OLECRANON (Left)  NWB LUE Sling at all times Not medically stable for d/c today Continue plan per cc team     Aundra Dubin 10/28/2017, 7:49 AM

## 2017-10-28 NOTE — Progress Notes (Signed)
CRITICAL VALUE ALERT  Critical Value:  Troponin 0.03  Date & Time Notified:  10/28/2017 06:18AM  Provider Notified: Corliss Skains, RN  Orders Received/Actions taken: Troponin's have maintained at 0.03, no increase at this time. No new orders. Will continue to monitor. Clint Bolder, RN 10/28/17 6:20 AM

## 2017-10-28 NOTE — Progress Notes (Signed)
NAME:  [name], MRN:  [id number], DOB:  1945/09/30, LOS: 1 ADMISSION DATE:  10/27/2017, CONSULTATION DATE:  10/17 REFERRING MD:  Dr. Erlinda Hong, CHIEF COMPLAINT:  Fall   Brief History   72 y/o M who presented to [facility] on 10/17 for planned repair of left olecranon fracture.  The patient suffered a fall at home on 10/10 with left arm pain.  He was reportedly intoxicated at time of fall.  Post-operatively, he was unable to extubate to due intermittent agitation, sedation and concerns for shoulder nerve block related diaphragmatic effects.    Past Medical History  ETOH abuse with cirrhosis of the liver, ETOH related dementia, CAD/PVD, HTN, HLD, murmur, paroxysmal atrial tachycardia, DCM (LVEF 45-50% 2017),GERD, hiatal hernia, HOH, frequent falls  Significant Hospital Events   10/17  Admit for repair of left olecranon fracture   Consults: date of consult/date signed off & final recs:  10/17  PCCM   Procedures (surgical and bedside):  10/17  ORIF of left olecranon avulsion fracture  Significant Diagnostic Tests:  10/11 L Elbow XR >> acute intra-articular fracture through the olecranon with extension into the elbow joint and joint effusion   Micro Data:    Antimicrobials:   Ancef 10/17 (OR)  Subjective:   He is critically ill, orally intubated with severe agitation in spite of max dose of Precedex not increased to 1.7. Urine output is low and bladder scan shows 500 cc in the bladder Afebrile  Objective   Blood pressure (!) 96/52, pulse 79, temperature 99.7 F (37.6 C), temperature source Oral, resp. rate 18, height 5\' 5"  (1.651 m), weight 90.5 kg, SpO2 100 %.    Vent Mode: PRVC FiO2 (%):  [30 %-70 %] 30 % Set Rate:  [18 bmp] 18 bmp Vt Set:  [560 mL] 560 mL PEEP:  [5 cmH20] 5 cmH20 Plateau Pressure:  [21 cmH20-31 cmH20] 25 cmH20   Intake/Output Summary (Last 24 hours) at 10/28/2017 5643 Last data filed at 10/28/2017 0800 Gross per 24 hour  Intake 2817.97 ml  Output 650 ml  Net  2167.97 ml   Filed Weights   10/27/17 1123 10/27/17 1658  Weight: 85.7 kg 90.5 kg    Examination: Chronically ill appearing, sedated after being given fentanyl, RA SS -4 No pallor or icterus Decreased breath sounds bilateral Soft protuberant abdomen Left arm in a sling S1-S2 normal No pedal edema  Chest x-ray personally reviewed which shows widened mediastinum as prior, no significant edema or infiltrates  Resolved Hospital Problem list     Assessment & Plan:   Acute Respiratory failure - concern left supraclavicular block may have affect on the diaphragm P: PRVC 8 cc/kg  WUA / SBT once agitation controlled, not tolerating weaning this a.m. Ventilator waveform suggest COPD, albuterol nebs as needed, no bronchospasm at present   Acute Metabolic Encephalopathy  ETOH Abuse -- unclear last ETOH use, typically a bottle of wine per day - ? Component of ETOH withdrawal P:  Titrate Precedex down to 1.2 range. Use Ativan per CIWA protocol, also extending Ativan 1 twice daily PRN fentanyl for pain   Hypokalemia replete  Left Olecranon Fracture s/p ORIF (10/17) P: Per Ortho    Dilated cardiomyopathy (LVEF 55-60%), HTN, HLD - followed by Dr. Radford Pax  P: Hold home metoprolol for now    Hx Depression, Chronic Pain P: Hold home flexeril, cymbalta, oxycodone, trazodone   Former Smoker P: Consider outpatient COPD evaluation   Disposition / Summary of Today's Plan 10/28/17  Try to control agitation better by adding Ativan to Precedex. Place Foley in case urinary distention/retention is causing delirium, treat pain Again trial of spontaneous breathing with goal extubation      Code Status: Full Code  Family Communication: No family available at time of evaluation.    Labs   CBC: Recent Labs  Lab 10/21/17 1807 10/27/17 1050 10/27/17 1722 10/27/17 2259 10/28/17 0507  WBC 4.9  --  3.1* 4.3 5.5  NEUTROABS 3.3  --   --   --   --   HGB 11.7* 10.9* 10.0*  10.3* 10.7*  HCT 35.3* 32.0* 30.5* 31.9* 32.4*  MCV 84.2  --  87.1 87.2 85.7  PLT 83*  --  157 192 242    Basic Metabolic Panel: Recent Labs  Lab 10/21/17 1807 10/27/17 1050 10/27/17 1722 10/27/17 2259 10/28/17 0507  NA 129* 134* 134*  --  132*  K 3.8 3.1* 2.9*  --  3.5  CL 89*  --  93*  --  96*  CO2 20*  --  24  --  23  GLUCOSE 93 98 112*  --  185*  BUN <5*  --  20  --  19  CREATININE 0.77  --  1.08 1.15 1.08  CALCIUM 8.4*  --  8.2*  --  8.0*   GFR: Estimated Creatinine Clearance: 64.9 mL/min (by C-G formula based on SCr of 1.08 mg/dL). Recent Labs  Lab 10/21/17 1807 10/27/17 1722 10/27/17 2259 10/28/17 0507  WBC 4.9 3.1* 4.3 5.5    Liver Function Tests: Recent Labs  Lab 10/21/17 1807 10/27/17 1722  AST 72* 50*  ALT 32 25  ALKPHOS 99 96  BILITOT 0.8 1.4*  PROT 6.8 5.9*  ALBUMIN 4.1 3.0*   No results for input(s): LIPASE, AMYLASE in the last 168 hours. No results for input(s): AMMONIA in the last 168 hours.  ABG    Component Value Date/Time   PHART 7.449 12/26/2009 1920   PCO2ART 25.3 (L) 12/26/2009 1920   PO2ART 73.0 (L) 12/26/2009 1920   HCO3 17.6 (L) 12/26/2009 1920   TCO2 22 11/02/2011 1022   ACIDBASEDEF 5.0 (H) 12/26/2009 1920   O2SAT 96.0 12/26/2009 1920     Coagulation Profile: No results for input(s): INR, PROTIME in the last 168 hours.  Cardiac Enzymes: Recent Labs  Lab 10/27/17 1722 10/27/17 2259 10/28/17 0507  TROPONINI <0.03 <0.03 0.03*    HbA1C: Hgb A1c MFr Bld  Date/Time Value Ref Range Status  03/03/2012 12:00 PM 5.6 4.6 - 6.5 % Final    Comment:    Glycemic Control Guidelines for People with Diabetes:Non Diabetic:  <6%Goal of Therapy: <7%Additional Action Suggested:  >8%     CBG: Recent Labs  Lab 10/21/17 1737 10/27/17 1651 10/28/17 0824  GLUCAP 103* 127* 166*    My critical care time x 58m  Kara Mead MD. FCCP. [facility] Pager [phone] If no response call [phone]      10/28/2017, 8:52 AM

## 2017-10-28 NOTE — Plan of Care (Signed)
  Problem: Coping: Goal: Level of anxiety will decrease Outcome: Not Progressing  Pt has been anxious, agitated, and combative throughout the shift. Pt has been unable to be verbally redirected, pulling at IV lines and tubes, requiring 4 point restraints and anti-anxiety/anti-agitation medications q2hours.

## 2017-10-28 NOTE — Anesthesia Postprocedure Evaluation (Signed)
Anesthesia Post Note  Patient: [name]  Procedure(s) Performed: OPEN REDUCTION INTERNAL FIXATION (ORIF) LEFT OLECRANON (Left Elbow)     Patient location during evaluation: PACU Anesthesia Type: Regional Level of consciousness: sedated and patient remains intubated per anesthesia plan Pain management: pain level controlled Vital Signs Assessment: post-procedure vital signs reviewed and stable Respiratory status: patient on ventilator - see flowsheet for VS Cardiovascular status: blood pressure returned to baseline and stable Postop Assessment: no apparent nausea or vomiting Anesthetic complications: no    Last Vitals:  Vitals:   10/28/17 0500 10/28/17 0600  BP: (!) 116/56 112/84  Pulse: 73 78  Resp: 13 18  Temp:    SpO2: 100% 100%    Last Pain:  Vitals:   10/28/17 0432  TempSrc:   PainSc: Asleep                 Montez Hageman

## 2017-10-28 NOTE — Progress Notes (Signed)
RN called Warren Lacy requesting versed drip as pt maxed out his 1mg  Versed Q3min per protocol. Awaiting new orders. Pt is easily agitated and combative with any stimulation and unable to be redirected verbally.Orders received for 1-2mg  versed Q2hours. Will continue to monitor closely. Clint Bolder, RN 10/28/17 2:20 AM

## 2017-10-28 NOTE — Progress Notes (Signed)
Initial Nutrition Assessment  DOCUMENTATION CODES:   Obesity unspecified  INTERVENTION:    Vital High Protein at 45 ml/h (1080 ml per day)  Pro-stat 30 ml BID  Provides 1280 kcal, 125 gm protein, 903 ml free water daily  NUTRITION DIAGNOSIS:   Inadequate oral intake related to inability to eat as evidenced by NPO status.  GOAL:   Provide needs based on ASPEN/SCCM guidelines  MONITOR:   Vent status, TF tolerance, Labs, I & O's  REASON FOR ASSESSMENT:   Ventilator, Consult Enteral/tube feeding initiation and management  ASSESSMENT:   72 yo male with PMH of HTN, HLD, BPH, GERD, PVD, hiatal hernia, CAD, and cirrhosis of the liver who was admitted on 10/17 for planned repair of left olecranon fracture (injured s/p fall at home on 10/10). Post-op patient was unable to be extubated.   Unable to extubate today per RN. Concern for shoulder nerve block related diaphragmatic effects.  Received MD Consult for TF initiation and management. NGT in place.  Patient is currently intubated on ventilator support MV: 10.2 L/min Temp (24hrs), Avg:98.5 F (36.9 C), Min:97.3 F (36.3 C), Max:99.7 F (37.6 C)   Labs reviewed. Sodium 132 (L) CBG's: 166-197 Medications reviewed and include folic acid and thiamine.  No weight loss PTA per review of weight encounters.    NUTRITION - FOCUSED PHYSICAL EXAM:    Most Recent Value  Orbital Region  No depletion  Upper Arm Region  No depletion  Thoracic and Lumbar Region  No depletion  Buccal Region  No depletion  Temple Region  No depletion  Clavicle Bone Region  No depletion  Clavicle and Acromion Bone Region  No depletion  Scapular Bone Region  Unable to assess  Dorsal Hand  No depletion  Patellar Region  No depletion  Anterior Thigh Region  No depletion  Posterior Calf Region  No depletion  Edema (RD Assessment)  Mild  Hair  Reviewed  Eyes  Unable to assess  Mouth  Unable to assess  Skin  Reviewed  Nails  Reviewed        Diet Order:   Diet Order            Diet NPO time specified  Diet effective now              EDUCATION NEEDS:   No education needs have been identified at this time  Skin:  Skin Assessment: Skin Integrity Issues: Skin Integrity Issues:: Incisions Incisions: surgical incision to L arm  Last BM:  10/15  Height:   Ht Readings from Last 1 Encounters:  10/27/17 5\' 5"  (1.651 m)    Weight:   Wt Readings from Last 1 Encounters:  10/27/17 90.5 kg    Ideal Body Weight:  61.8 kg  BMI:  Body mass index is 33.2 kg/m.  Estimated Nutritional Needs:   Kcal:  1000-1270  Protein:  124 gm  Fluid:  1.8-2 L    Molli Barrows, RD, LDN, CNSC Pager [phone] After Hours Pager [phone]

## 2017-10-29 ENCOUNTER — Inpatient Hospital Stay (HOSPITAL_COMMUNITY): Payer: Medicare Other

## 2017-10-29 LAB — GLUCOSE, CAPILLARY
GLUCOSE-CAPILLARY: 130 mg/dL — AB (ref 70–99)
GLUCOSE-CAPILLARY: 129 mg/dL — AB (ref 70–99)
GLUCOSE-CAPILLARY: 127 mg/dL — AB (ref 70–99)
Glucose-Capillary: 122 mg/dL — ABNORMAL HIGH (ref 70–99)
Glucose-Capillary: 114 mg/dL — ABNORMAL HIGH (ref 70–99)

## 2017-10-29 LAB — BASIC METABOLIC PANEL
ANION GAP: 9 (ref 5–15)
BUN: 26 mg/dL — ABNORMAL HIGH (ref 8–23)
CALCIUM: 7.9 mg/dL — AB (ref 8.9–10.3)
CO2: 27 mmol/L (ref 22–32)
Chloride: 101 mmol/L (ref 98–111)
Creatinine, Ser: 1.14 mg/dL (ref 0.61–1.24)
GFR calc Af Amer: >60 mL/min (ref >60)
GFR calc non Af Amer: >60 mL/min (ref >60)
GLUCOSE: 131 mg/dL — AB (ref 70–99)
Potassium: 3.3 mmol/L — ABNORMAL LOW (ref 3.5–5.1)
Sodium: 137 mmol/L (ref 135–145)

## 2017-10-29 LAB — CBC
HEMATOCRIT: 31.7 % — AB (ref 39.0–52.0)
Hemoglobin: 10.3 g/dL — ABNORMAL LOW (ref 13.0–17.0)
MCH: 28.8 pg (ref 26.0–34.0)
MCHC: 32.5 g/dL (ref 30.0–36.0)
MCV: 88.5 fL (ref 80.0–100.0)
NRBC: 0 % (ref 0.0–0.2)
Platelets: 229 10*3/uL (ref 150–400)
RBC: 3.58 MIL/uL — ABNORMAL LOW (ref 4.22–5.81)
RDW: 16 % — AB (ref 11.5–15.5)
WBC: 6.4 10*3/uL (ref 4.0–10.5)

## 2017-10-29 LAB — MAGNESIUM: Magnesium: 1.6 mg/dL — ABNORMAL LOW (ref 1.7–2.4)

## 2017-10-29 LAB — PHOSPHORUS: Phosphorus: 2.3 mg/dL — ABNORMAL LOW (ref 2.5–4.6)

## 2017-10-29 MED ORDER — LORAZEPAM 1 MG PO TABS
1.0000 mg | ORAL_TABLET | Freq: Three times a day (TID) | ORAL | Status: DC
  Administered 2017-10-29 – 2017-11-07 (×26): 1 mg
  Filled 2017-10-29 (×27): qty 1

## 2017-10-29 MED ORDER — MAGNESIUM SULFATE 2 GM/50ML IV SOLN
2.0000 g | Freq: Once | INTRAVENOUS | Status: AC
  Administered 2017-10-29: 2 g via INTRAVENOUS
  Filled 2017-10-29: qty 50

## 2017-10-29 MED ORDER — FUROSEMIDE 10 MG/ML IJ SOLN
40.0000 mg | Freq: Once | INTRAMUSCULAR | Status: AC
  Administered 2017-10-29: 40 mg via INTRAVENOUS
  Filled 2017-10-29: qty 4

## 2017-10-29 MED ORDER — POTASSIUM CHLORIDE 20 MEQ/15ML (10%) PO SOLN
20.0000 meq | ORAL | Status: AC
  Administered 2017-10-29 (×2): 20 meq
  Filled 2017-10-29 (×2): qty 15

## 2017-10-29 MED ORDER — POTASSIUM PHOSPHATES 15 MMOLE/5ML IV SOLN
30.0000 mmol | Freq: Once | INTRAVENOUS | Status: AC
  Administered 2017-10-29: 30 mmol via INTRAVENOUS
  Filled 2017-10-29: qty 10

## 2017-10-29 NOTE — Progress Notes (Signed)
NAME:  [name], MRN:  [id number], DOB:  [birth year], LOS: 2 ADMISSION DATE:  10/27/2017, CONSULTATION DATE:  10/17 REFERRING MD:  Dr. Erlinda Hong, CHIEF COMPLAINT:  Fall   Brief History   73 y/o M who presented to [facility] on 10/17 for planned repair of left olecranon fracture.  The patient suffered a fall at home on 10/10 with left arm pain.  He was reportedly intoxicated at time of fall.  Post-operatively, he was unable to extubate to due intermittent agitation, sedation and concerns for shoulder nerve block related diaphragmatic effects.    Past Medical History  ETOH abuse with cirrhosis of the liver, ETOH related dementia, CAD/PVD, HTN, HLD, murmur, paroxysmal atrial tachycardia, DCM (LVEF 45-50% 2017),GERD, hiatal hernia, HOH, frequent falls  Significant Hospital Events   10/17  Admit for repair of left olecranon fracture   Consults: date of consult/date signed off & final recs:  10/17  PCCM   Procedures (surgical and bedside):  10/17  ORIF of left olecranon avulsion fracture  Significant Diagnostic Tests:  10/11 L Elbow XR >> acute intra-articular fracture through the olecranon with extension into the elbow joint and joint effusion   Micro Data:    Antimicrobials:   Ancef 10/17 (OR)  Subjective:   Afebrile Intermittent agitation continues requiring bolus Ativan overnight. Remains on Precedex  Objective   Blood pressure (!) 137/36, pulse 88, temperature 99.2 F (37.3 C), temperature source Axillary, resp. rate 17, height 5\' 5"  (1.651 m), weight 92.5 kg, SpO2 97 %.    Vent Mode: PRVC FiO2 (%):  [30 %] 30 % Set Rate:  [18 bmp] 18 bmp Vt Set:  [560 mL] 560 mL PEEP:  [5 cmH20] 5 cmH20 Plateau Pressure:  [21 cmH20-26 cmH20] 22 cmH20   Intake/Output Summary (Last 24 hours) at 10/29/2017 0949 Last data filed at 10/29/2017 0900 Gross per 24 hour  Intake 3165.85 ml  Output 810 ml  Net 2355.85 ml   Filed Weights   10/27/17 1123 10/27/17 1658 10/29/17 0452  Weight: 85.7 kg  90.5 kg 92.5 kg    Examination: Chronically ill appearing, RASS-3 on Precedex drip No pallor or icterus Decreased breath sounds bilateral Soft protuberant abdomen Left arm in a sling S1-S2 normal 1+ pedal edema  Chest x-ray personally reviewed which shows wide mediastinum as prior, no new infiltrates  Resolved Hospital Problem list     Assessment & Plan:   Acute Respiratory failure - concern left supraclavicular block  effect on the diaphragm , should have resolved ?  Underlying COPD, ex smoker P: PRVC 8 cc/kg  WUA / SBT -agitation seems to be the main barrier albuterol nebs as needed, no bronchospasm at present   Acute Metabolic Encephalopathy  ETOH Abuse -- unclear last ETOH use, typically a bottle of wine per day - Likely Component of ETOH withdrawal P:  Titrate Precedex down to 1.2 range. Use Ativan per CIWA protocol, increase standing dose Ativan 1 tid PRN fentanyl for pain  Restraints renewed for safety  Hx Depression, Chronic Pain P: Hold home flexeril, cymbalta, oxycodone, trazodone  Dilated cardiomyopathy (LVEF 55-60%), HTN, HLD - followed by Dr. Radford Pax  P: Hold home metoprolol for now   Hypokalemia/hypophosphatemia -  replete  Left Olecranon Fracture s/p ORIF (10/17) P: Per Ortho  Sling at all times    Disposition / Summary of Today's Plan 10/29/17    Again agitation seems to be the main barrier to extubation, will increase Ativan and once this is better controlled, can proceed  with spontaneous breathing trial with goal extubation      Code Status: Full Code  Family Communication: Wife [name] updated 10/18  Labs   CBC: Recent Labs  Lab 10/27/17 1050 10/27/17 1722 10/27/17 2259 10/28/17 0507 10/29/17 0417  WBC  --  3.1* 4.3 5.5 6.4  HGB 10.9* 10.0* 10.3* 10.7* 10.3*  HCT 32.0* 30.5* 31.9* 32.4* 31.7*  MCV  --  87.1 87.2 85.7 88.5  PLT  --  157 192 202 188    Basic Metabolic Panel: Recent Labs  Lab 10/27/17 1050  10/27/17 1722 10/27/17 2259 10/28/17 0507 10/29/17 0417  NA 134* 134*  --  132* 137  K 3.1* 2.9*  --  3.5 3.3*  CL  --  93*  --  96* 101  CO2  --  24  --  23 27  GLUCOSE 98 112*  --  185* 131*  BUN  --  20  --  19 26*  CREATININE  --  1.08 1.15 1.08 1.14  CALCIUM  --  8.2*  --  8.0* 7.9*  MG  --   --   --   --  1.6*  PHOS  --   --   --   --  2.3*   GFR: Estimated Creatinine Clearance: 62.1 mL/min (by C-G formula based on SCr of 1.14 mg/dL). Recent Labs  Lab 10/27/17 1722 10/27/17 2259 10/28/17 0507 10/29/17 0417  WBC 3.1* 4.3 5.5 6.4    Liver Function Tests: Recent Labs  Lab 10/27/17 1722  AST 50*  ALT 25  ALKPHOS 96  BILITOT 1.4*  PROT 5.9*  ALBUMIN 3.0*   No results for input(s): LIPASE, AMYLASE in the last 168 hours. No results for input(s): AMMONIA in the last 168 hours.  ABG    Component Value Date/Time   PHART 7.449 12/26/2009 1920   PCO2ART 25.3 (L) 12/26/2009 1920   PO2ART 73.0 (L) 12/26/2009 1920   HCO3 17.6 (L) 12/26/2009 1920   TCO2 22 11/02/2011 1022   ACIDBASEDEF 5.0 (H) 12/26/2009 1920   O2SAT 96.0 12/26/2009 1920     Coagulation Profile: No results for input(s): INR, PROTIME in the last 168 hours.  Cardiac Enzymes: Recent Labs  Lab 10/27/17 1722 10/27/17 2259 10/28/17 0507  TROPONINI <0.03 <0.03 0.03*    HbA1C: Hgb A1c MFr Bld  Date/Time Value Ref Range Status  03/03/2012 12:00 PM 5.6 4.6 - 6.5 % Final    Comment:    Glycemic Control Guidelines for People with Diabetes:Non Diabetic:  <6%Goal of Therapy: <7%Additional Action Suggested:  >8%     CBG: Recent Labs  Lab 10/28/17 1155 10/28/17 1531 10/28/17 2000 10/28/17 2335 10/29/17 0338  GLUCAP 197* 146* 142* 132* 130*    My critical care time was 32 minutes  Kara Mead MD. FCCP. [place] Pager [phone] If no response call [phone]     10/29/2017, 9:49 AM

## 2017-10-29 NOTE — Clinical Social Work Note (Signed)
CSW acknowledges SNF consult. Please enter PT and OT orders so patient can be evaluated.  [name], [place]

## 2017-10-29 NOTE — Progress Notes (Signed)
eLink Physician-Brief Progress Note Patient Name: [name] DOB: April 09, 1945 MRN: [id number]   Date of Service  10/29/2017  HPI/Events of Note  K+ 3.3  eICU Interventions  Elink K+ replacement protocol        Kerry Kass Ogan 10/29/2017, 7:08 AM

## 2017-10-29 NOTE — Progress Notes (Signed)
[facility] ADULT ICU REPLACEMENT PROTOCOL FOR AM LAB REPLACEMENT ONLY  The patient does not apply for the [facility] Adult ICU Electrolyte Replacment Protocol based on the criteria listed below:     Is urine output >/= 0.5 ml/kg/hr for the last 6 hours? No. Patient's UOP is 0.4 ml/kg/hr   Abnormal electrolyte(s): K3.3  6. If a panic level lab has been reported, has the CCM MD in charge been notified? Yes.  .   Physician:  Rosine Door 10/29/2017 6:23 AM

## 2017-10-30 ENCOUNTER — Inpatient Hospital Stay (HOSPITAL_COMMUNITY): Payer: Medicare Other

## 2017-10-30 LAB — CBC
HEMATOCRIT: 30.9 % — AB (ref 39.0–52.0)
Hemoglobin: 10.3 g/dL — ABNORMAL LOW (ref 13.0–17.0)
MCH: 28.8 pg (ref 26.0–34.0)
MCHC: 33.3 g/dL (ref 30.0–36.0)
MCV: 86.3 fL (ref 80.0–100.0)
NRBC: 0 % (ref 0.0–0.2)
PLATELETS: 246 10*3/uL (ref 150–400)
RBC: 3.58 MIL/uL — ABNORMAL LOW (ref 4.22–5.81)
RDW: 15.9 % — ABNORMAL HIGH (ref 11.5–15.5)
WBC: 10.1 10*3/uL (ref 4.0–10.5)

## 2017-10-30 LAB — BASIC METABOLIC PANEL
ANION GAP: 9 (ref 5–15)
BUN: 23 mg/dL (ref 8–23)
CO2: 23 mmol/L (ref 22–32)
Calcium: 7.6 mg/dL — ABNORMAL LOW (ref 8.9–10.3)
Chloride: 102 mmol/L (ref 98–111)
Creatinine, Ser: 0.86 mg/dL (ref 0.61–1.24)
GFR calc Af Amer: >60 mL/min (ref >60)
GLUCOSE: 137 mg/dL — AB (ref 70–99)
Potassium: 3.1 mmol/L — ABNORMAL LOW (ref 3.5–5.1)
Sodium: 134 mmol/L — ABNORMAL LOW (ref 135–145)

## 2017-10-30 LAB — GLUCOSE, CAPILLARY
GLUCOSE-CAPILLARY: 128 mg/dL — AB (ref 70–99)
GLUCOSE-CAPILLARY: 146 mg/dL — AB (ref 70–99)
Glucose-Capillary: 127 mg/dL — ABNORMAL HIGH (ref 70–99)
Glucose-Capillary: 129 mg/dL — ABNORMAL HIGH (ref 70–99)
Glucose-Capillary: 143 mg/dL — ABNORMAL HIGH (ref 70–99)
Glucose-Capillary: 146 mg/dL — ABNORMAL HIGH (ref 70–99)

## 2017-10-30 LAB — PHOSPHORUS: PHOSPHORUS: 3 mg/dL (ref 2.5–4.6)

## 2017-10-30 LAB — MAGNESIUM: Magnesium: 1.3 mg/dL — ABNORMAL LOW (ref 1.7–2.4)

## 2017-10-30 MED ORDER — MAGNESIUM SULFATE 4 GM/100ML IV SOLN
4.0000 g | Freq: Once | INTRAVENOUS | Status: AC
  Administered 2017-10-30: 4 g via INTRAVENOUS
  Filled 2017-10-30: qty 100

## 2017-10-30 MED ORDER — INFLUENZA VAC SPLIT HIGH-DOSE 0.5 ML IM SUSY: 0.5000 mL | PREFILLED_SYRINGE | INTRAMUSCULAR | Status: DC

## 2017-10-30 MED ORDER — FUROSEMIDE 10 MG/ML IJ SOLN
40.0000 mg | Freq: Once | INTRAMUSCULAR | Status: AC
  Administered 2017-10-30: 40 mg via INTRAVENOUS
  Filled 2017-10-30: qty 4

## 2017-10-30 MED ORDER — CHLORHEXIDINE GLUCONATE CLOTH 2 % EX PADS
6.0000 | MEDICATED_PAD | Freq: Every morning | CUTANEOUS | Status: DC
  Administered 2017-10-31 – 2017-11-06 (×6): 6 via TOPICAL

## 2017-10-30 MED ORDER — POTASSIUM CHLORIDE 10 MEQ/100ML IV SOLN
10.0000 meq | INTRAVENOUS | Status: AC
  Administered 2017-10-30 (×6): 10 meq via INTRAVENOUS
  Filled 2017-10-30 (×6): qty 100

## 2017-10-30 MED ORDER — POTASSIUM CHLORIDE 20 MEQ/15ML (10%) PO SOLN
20.0000 meq | ORAL | Status: AC
  Administered 2017-10-30 (×2): 20 meq
  Filled 2017-10-30 (×2): qty 15

## 2017-10-30 NOTE — Progress Notes (Signed)
   Subjective: 3 Days Post-Op Procedure(s) (LRB): OPEN REDUCTION INTERNAL FIXATION (ORIF) LEFT OLECRANON (Left) Patient reports pain as intubated and sedated with agitation.    Objective: Vital signs in last 24 hours: Temp:  [99 F (37.2 C)-99.9 F (37.7 C)] 99 F (37.2 C) (10/20 1137) Pulse Rate:  [60-99] 90 (10/20 1119) Resp:  [17-28] 24 (10/20 1119) BP: (69-157)/(39-106) 121/72 (10/20 1119) SpO2:  [89 %-100 %] 100 % (10/20 1215) FiO2 (%):  [30 %] 30 % (10/20 1215) Weight:  [90.5 kg] 90.5 kg (10/20 0340)  Intake/Output from previous day: 10/19 0701 - 10/20 0700 In: 2888.1 [I.V.:796.4; NG/GT:1300; IV Piggyback:791.6] Out: 3075 [Urine:3075] Intake/Output this shift: Total I/O In: 744.2 [I.V.:104.5; NG/GT:360; IV Piggyback:279.7] Out: 230 [Urine:230]  Recent Labs    10/27/17 1722 10/27/17 2259 10/28/17 0507 10/29/17 0417 10/30/17 0324  HGB 10.0* 10.3* 10.7* 10.3* 10.3*   Recent Labs    10/29/17 0417 10/30/17 0324  WBC 6.4 10.1  RBC 3.58* 3.58*  HCT 31.7* 30.9*  PLT 229 246   Recent Labs    10/29/17 0417 10/30/17 0324  NA 137 134*  K 3.3* 3.1*  CL 101 102  CO2 27 23  BUN 26* 23  CREATININE 1.14 0.86  GLUCOSE 131* 137*  CALCIUM 7.9* 7.6*   No results for input(s): LABPT, INR in the last 72 hours.  splint intact Dg Chest Port 1 View  Result Date: 10/30/2017 CLINICAL DATA:  Acute respiratory failure. EXAM: PORTABLE CHEST 1 VIEW COMPARISON:  10/29/2017 FINDINGS: Endotracheal tube 5.3 cm above the carina. Nasogastric tube with tip over the stomach in the left upper quadrant. Patient slightly rotated to the right. Lungs are adequately inflated and demonstrate mild prominence of the central perihilar markings suggesting mild vascular congestion. Stable cardiomegaly. Remainder of the exam is unchanged. IMPRESSION: Stable cardiomegaly with suggestion of mild vascular congestion. Tubes and lines as described. Electronically Signed   By: Marin Olp M.D.   On:  10/30/2017 10:20    Assessment/Plan: 3 Days Post-Op Procedure(s) (LRB): OPEN REDUCTION INTERNAL FIXATION (ORIF) LEFT OLECRANON (Left) ARF with agitation post op, alcohol withdrawal .   [name] 10/30/2017, 12:35 PM

## 2017-10-30 NOTE — Progress Notes (Signed)
[facility] Progress Note Patient Name: [name] DOB: 1945-01-24 MRN: [id number]   Date of Service  10/30/2017  HPI/Events of Note  Low K and mg  eICU Interventions  replaced     Intervention Category Major Interventions: Electrolyte abnormality - evaluation and management  Mauri [place], P 10/30/2017, 5:05 AM

## 2017-10-30 NOTE — Progress Notes (Signed)
NAME:  [name], MRN:  [id number], DOB:  1945-10-11, LOS: 3 ADMISSION DATE:  10/27/2017, CONSULTATION DATE:  10/17 REFERRING MD:  Dr. Erlinda Hong, CHIEF COMPLAINT:  Fall   Brief History   72 y/o M who presented to [facility] on 10/17 for planned repair of left olecranon fracture.  The patient suffered a fall at home on 10/10 with left arm pain.  He was reportedly intoxicated at time of fall.  Post-operatively, he was unable to extubate to due intermittent agitation, sedation and concerns for shoulder nerve block related diaphragmatic effects.    Past Medical History  ETOH abuse with cirrhosis of the liver, ETOH related dementia, CAD/PVD, HTN, HLD, murmur, paroxysmal atrial tachycardia, DCM (LVEF 45-50% 2017),GERD, hiatal hernia, HOH, frequent falls  Significant Hospital Events   10/17  Admit for repair of left olecranon fracture   Consults: date of consult/date signed off & final recs:  10/17  PCCM   Procedures (surgical and bedside):  10/17  ORIF of left olecranon avulsion fracture  Significant Diagnostic Tests:  10/11 L Elbow XR >> acute intra-articular fracture through the olecranon with extension into the elbow joint and joint effusion   Micro Data:  resp 10/20 >>   Antimicrobials:   Ancef 10/17 (OR)  Subjective:  He is critically ill, intubated. Breakthrough agitation intermittently requiring additional doses of Ativan Good urine output with Lasix  Objective   Blood pressure 111/71, pulse 62, temperature 99.5 F (37.5 C), temperature source Axillary, resp. rate (!) 22, height 5\' 5"  (1.651 m), weight 90.5 kg, SpO2 95 %.    Vent Mode: CPAP;PSV FiO2 (%):  [30 %-40 %] 30 % Set Rate:  [18 bmp] 18 bmp Vt Set:  [460 mL-560 mL] 490 mL PEEP:  [5 cmH20] 5 cmH20 Pressure Support:  [12 cmH20-15 cmH20] 15 cmH20 Plateau Pressure:  [8 cmH20-31 cmH20] 22 cmH20   Intake/Output Summary (Last 24 hours) at 10/30/2017 0948 Last data filed at 10/30/2017 0800 Gross per 24 hour  Intake 2617.69  ml  Output 3075 ml  Net -457.31 ml   Filed Weights   10/27/17 1658 10/29/17 0452 10/30/17 0340  Weight: 90.5 kg 92.5 kg 90.5 kg    Examination: Chronically ill appearing, RASS-3 on Precedex drip Wakes up and follows one-step commands but then gets agitated No pallor or icterus Decreased breath sounds bilateral Soft protuberant abdomen Left arm in a sling S1-S2 normal 1+ pedal edema  Chest x-ray personally reviewed, wide mediastinum , decreased effusions  Resolved Hospital Problem list     Assessment & Plan:   Acute Respiratory failure - left supraclavicular block  effect on the diaphragm , should have resolved ?  Underlying COPD, ex smoker P: PRVC 8 cc/kg  WUA / SBT -agitation seems to be the main barrier albuterol nebs as needed, no bronchospasm at present   Acute Metabolic Encephalopathy  ETOH Abuse -- unclear last ETOH use, typically a bottle of wine per day - Likely Component of ETOH withdrawal P: Keep Precedex down to 1.2 range. Use Ativan per CIWA protocol, increased standing dose Ativan 1 tid PRN fentanyl for pain  Restraints renewed for safety  Hx Depression, Chronic Pain P: Hold home flexeril, cymbalta, oxycodone, trazodone  Dilated cardiomyopathy (LVEF 55-60%), HTN, HLD - followed by Dr. Radford Pax  P: Hold home metoprolol while on Precedex since heart rate 60s   Hypokalemia/hypophosphatemia/hypomagnesemia-  repleted  Left Olecranon Fracture s/p ORIF (10/17) P: Per Ortho  Sling at all times    Disposition / Summary of  Today's Plan 10/30/17    Agitation seems to be the main barrier to extubation similar to his prior experience with surgery.  Once this is controlled I feel that we can quickly wean him to extubation Meanwhile keep equal balance with Lasix and replete electrolytes      Code Status: Full Code  Family Communication: Wife [name] updated 10/20  Labs   CBC: Recent Labs  Lab 10/27/17 1722 10/27/17 2259 10/28/17 0507  10/29/17 0417 10/30/17 0324  WBC 3.1* 4.3 5.5 6.4 10.1  HGB 10.0* 10.3* 10.7* 10.3* 10.3*  HCT 30.5* 31.9* 32.4* 31.7* 30.9*  MCV 87.1 87.2 85.7 88.5 86.3  PLT 157 192 202 229 893    Basic Metabolic Panel: Recent Labs  Lab 10/27/17 1050 10/27/17 1722 10/27/17 2259 10/28/17 0507 10/29/17 0417 10/30/17 0324  NA 134* 134*  --  132* 137 134*  K 3.1* 2.9*  --  3.5 3.3* 3.1*  CL  --  93*  --  96* 101 102  CO2  --  24  --  23 27 23   GLUCOSE 98 112*  --  185* 131* 137*  BUN  --  20  --  19 26* 23  CREATININE  --  1.08 1.15 1.08 1.14 0.86  CALCIUM  --  8.2*  --  8.0* 7.9* 7.6*  MG  --   --   --   --  1.6* 1.3*  PHOS  --   --   --   --  2.3* 3.0   GFR: Estimated Creatinine Clearance: 81.5 mL/min (by C-G formula based on SCr of 0.86 mg/dL). Recent Labs  Lab 10/27/17 2259 10/28/17 0507 10/29/17 0417 10/30/17 0324  WBC 4.3 5.5 6.4 10.1    Liver Function Tests: Recent Labs  Lab 10/27/17 1722  AST 50*  ALT 25  ALKPHOS 96  BILITOT 1.4*  PROT 5.9*  ALBUMIN 3.0*   No results for input(s): LIPASE, AMYLASE in the last 168 hours. No results for input(s): AMMONIA in the last 168 hours.  ABG    Component Value Date/Time   PHART 7.449 12/26/2009 1920   PCO2ART 25.3 (L) 12/26/2009 1920   PO2ART 73.0 (L) 12/26/2009 1920   HCO3 17.6 (L) 12/26/2009 1920   TCO2 22 11/02/2011 1022   ACIDBASEDEF 5.0 (H) 12/26/2009 1920   O2SAT 96.0 12/26/2009 1920     Coagulation Profile: No results for input(s): INR, PROTIME in the last 168 hours.  Cardiac Enzymes: Recent Labs  Lab 10/27/17 1722 10/27/17 2259 10/28/17 0507  TROPONINI <0.03 <0.03 0.03*    HbA1C: Hgb A1c MFr Bld  Date/Time Value Ref Range Status  03/03/2012 12:00 PM 5.6 4.6 - 6.5 % Final    Comment:    Glycemic Control Guidelines for People with Diabetes:Non Diabetic:  <6%Goal of Therapy: <7%Additional Action Suggested:  >8%     CBG: Recent Labs  Lab 10/29/17 1549 10/29/17 1940 10/30/17 0020 10/30/17 0342  10/30/17 0745  GLUCAP 127* 114* 127* 128* 146*    My critical care time x 32 m  Kara Mead MD. FCCP. [facility] Pager [phone] If no response call [phone]     10/30/2017, 9:48 AM

## 2017-10-30 NOTE — Progress Notes (Signed)
PT Cancellation Note  Patient Details Name: [name] MRN: [id number] DOB: 02-11-1945   Cancelled Treatment:    Reason Eval/Treat Not Completed: Medical issues which prohibited therapy pt still intubated, RN requests holding, will cont to follow and eval when appropriate.   Reinaldo Berber, PT, DPT Acute Rehabilitation Services Pager: [phone] Office: [phone]     Reinaldo Berber 10/30/2017, 5:23 PM

## 2017-10-30 NOTE — Progress Notes (Signed)
RT NOTE: RT called to room for possible self extubation. RN and RT advanced to previous position. Bilateral breath sounds heard. Chest Xray pending.

## 2017-10-31 ENCOUNTER — Inpatient Hospital Stay: Payer: Self-pay

## 2017-10-31 ENCOUNTER — Inpatient Hospital Stay (HOSPITAL_COMMUNITY): Payer: Medicare Other

## 2017-10-31 DIAGNOSIS — J810 Acute pulmonary edema: Secondary | ICD-10-CM

## 2017-10-31 LAB — BASIC METABOLIC PANEL
ANION GAP: 7 (ref 5–15)
BUN: 23 mg/dL (ref 8–23)
CO2: 23 mmol/L (ref 22–32)
Calcium: 7.9 mg/dL — ABNORMAL LOW (ref 8.9–10.3)
Chloride: 102 mmol/L (ref 98–111)
Creatinine, Ser: 0.77 mg/dL (ref 0.61–1.24)
GFR calc Af Amer: >60 mL/min (ref >60)
GLUCOSE: 138 mg/dL — AB (ref 70–99)
POTASSIUM: 3.4 mmol/L — AB (ref 3.5–5.1)
SODIUM: 132 mmol/L — AB (ref 135–145)

## 2017-10-31 LAB — CBC
HCT: 30.9 % — ABNORMAL LOW (ref 39.0–52.0)
HEMOGLOBIN: 10.1 g/dL — AB (ref 13.0–17.0)
MCH: 28.8 pg (ref 26.0–34.0)
MCHC: 32.7 g/dL (ref 30.0–36.0)
MCV: 88 fL (ref 80.0–100.0)
Platelets: 252 10*3/uL (ref 150–400)
RBC: 3.51 MIL/uL — AB (ref 4.22–5.81)
RDW: 16.3 % — ABNORMAL HIGH (ref 11.5–15.5)
WBC: 10.1 10*3/uL (ref 4.0–10.5)
nRBC: 0 % (ref 0.0–0.2)

## 2017-10-31 LAB — GLUCOSE, CAPILLARY
GLUCOSE-CAPILLARY: 152 mg/dL — AB (ref 70–99)
Glucose-Capillary: 126 mg/dL — ABNORMAL HIGH (ref 70–99)
Glucose-Capillary: 126 mg/dL — ABNORMAL HIGH (ref 70–99)
Glucose-Capillary: 123 mg/dL — ABNORMAL HIGH (ref 70–99)
Glucose-Capillary: 115 mg/dL — ABNORMAL HIGH (ref 70–99)
Glucose-Capillary: 113 mg/dL — ABNORMAL HIGH (ref 70–99)

## 2017-10-31 LAB — MAGNESIUM: MAGNESIUM: 1.6 mg/dL — AB (ref 1.7–2.4)

## 2017-10-31 LAB — PHOSPHORUS: Phosphorus: 3 mg/dL (ref 2.5–4.6)

## 2017-10-31 MED ORDER — SODIUM CHLORIDE 0.9% FLUSH
10.0000 mL | INTRAVENOUS | Status: DC | PRN
  Administered 2017-11-01 – 2017-11-16 (×3): 10 mL
  Filled 2017-10-31 (×3): qty 40

## 2017-10-31 MED ORDER — FUROSEMIDE 10 MG/ML IJ SOLN
40.0000 mg | Freq: Four times a day (QID) | INTRAMUSCULAR | Status: AC
  Administered 2017-10-31 (×2): 40 mg via INTRAVENOUS
  Filled 2017-10-31 (×2): qty 4

## 2017-10-31 MED ORDER — VANCOMYCIN HCL IN DEXTROSE 1-5 GM/200ML-% IV SOLN
1000.0000 mg | Freq: Two times a day (BID) | INTRAVENOUS | Status: AC
  Administered 2017-11-01 – 2017-11-06 (×11): 1000 mg via INTRAVENOUS
  Filled 2017-10-31 (×13): qty 200

## 2017-10-31 MED ORDER — MAGNESIUM SULFATE 2 GM/50ML IV SOLN
2.0000 g | Freq: Once | INTRAVENOUS | Status: AC
  Administered 2017-10-31: 2 g via INTRAVENOUS
  Filled 2017-10-31: qty 50

## 2017-10-31 MED ORDER — ASPIRIN 81 MG PO CHEW
81.0000 mg | CHEWABLE_TABLET | Freq: Every day | ORAL | Status: DC
  Administered 2017-10-31 – 2017-11-09 (×10): 81 mg via ORAL
  Filled 2017-10-31 (×10): qty 1

## 2017-10-31 MED ORDER — POTASSIUM CHLORIDE 10 MEQ/100ML IV SOLN
10.0000 meq | INTRAVENOUS | Status: AC
  Administered 2017-10-31 (×5): 10 meq via INTRAVENOUS
  Filled 2017-10-31 (×5): qty 100

## 2017-10-31 MED ORDER — CHLORHEXIDINE GLUCONATE CLOTH 2 % EX PADS
6.0000 | MEDICATED_PAD | Freq: Every day | CUTANEOUS | Status: DC
  Administered 2017-11-01 – 2017-11-16 (×15): 6 via TOPICAL

## 2017-10-31 MED ORDER — SODIUM CHLORIDE 0.9% FLUSH
10.0000 mL | Freq: Two times a day (BID) | INTRAVENOUS | Status: DC
  Administered 2017-11-01 – 2017-11-16 (×18): 10 mL

## 2017-10-31 MED ORDER — VANCOMYCIN HCL 10 G IV SOLR
1750.0000 mg | Freq: Once | INTRAVENOUS | Status: AC
  Administered 2017-10-31: 1750 mg via INTRAVENOUS
  Filled 2017-10-31: qty 1750

## 2017-10-31 NOTE — Progress Notes (Signed)
Peripherally Inserted Central Catheter/Midline Placement  The IV Nurse has discussed with the patient and/or persons authorized to consent for the patient, the purpose of this procedure and the potential benefits and risks involved with this procedure.  The benefits include less needle sticks, lab draws from the catheter, and the patient may be discharged home with the catheter. Risks include, but not limited to, infection, bleeding, blood clot (thrombus formation), and puncture of an artery; nerve damage and irregular heartbeat and possibility to perform a PICC exchange if needed/ordered by physician.  Alternatives to this procedure were also discussed.  Bard Power PICC patient education guide, fact sheet on infection prevention and patient information card has been provided to patient /or left at bedside.    PICC/Midline Placement Documentation  PICC Double Lumen 34/91/79 PICC Right Basilic 41 cm 0 cm (Active)  Indication for Insertion or Continuance of Line Poor Vasculature-patient has had multiple peripheral attempts or PIVs lasting less than 24 hours 10/31/2017  6:00 PM  Exposed Catheter (cm) 0 cm 10/31/2017  6:00 PM  Site Assessment Clean;Dry;Intact 10/31/2017  6:00 PM  Lumen #1 Status Flushed;Blood return noted 10/31/2017  6:00 PM  Lumen #2 Status Flushed;Blood return noted 10/31/2017  6:00 PM  Dressing Type Transparent 10/31/2017  6:00 PM  Dressing Status Clean;Dry;Intact;Antimicrobial disc in place 10/31/2017  6:00 PM  Dressing Change Due 11/07/17 10/31/2017  6:00 PM       Jule Economy Horton 10/31/2017, 6:41 PM

## 2017-10-31 NOTE — Progress Notes (Signed)
NAME:  [name], MRN:  [id number], DOB:  Jul 04, 1945, LOS: 4 ADMISSION DATE:  10/27/2017, CONSULTATION DATE:  10/27/17 REFERRING MD:  Erlinda Hong, CHIEF COMPLAINT:  Fall   Brief History   70 male presented to [facility] on 10/17 for a left olecranon fracture.  Post op was unable to be extubated.   Past Medical History  EtOH abuse, PAD, CAD, HTN, afib, dilated cardiomyopathy, GERD Significant Hospital Events   10/17 admit for repair of left olecranon fracture  Consults: date of consult/date signed off & final recs:  10/17 PCCM   Procedures (surgical and bedside):  10/17 ORIF  Significant Diagnostic Tests:  10/11 L elbow x-ray> broken  Micro Data:  resp culture > staph aureus  Antimicrobials:  10/21 vanc >    Subjective:  Remains mechanically ventilated Some agitation from time to time  Objective   Blood pressure 137/89, pulse 82, temperature 99.5 F (37.5 C), temperature source Oral, resp. rate 18, height 5\' 5"  (1.651 m), weight 90.1 kg, SpO2 98 %.    Vent Mode: PRVC FiO2 (%):  [30 %] 30 % Set Rate:  [18 bmp] 18 bmp Vt Set:  [490 mL] 490 mL PEEP:  [5 cmH20] 5 cmH20 Pressure Support:  [15 cmH20] 15 cmH20 Plateau Pressure:  [13 cmH20-27 cmH20] 22 cmH20   Intake/Output Summary (Last 24 hours) at 10/31/2017 1435 Last data filed at 10/31/2017 1200 Gross per 24 hour  Intake 1967.73 ml  Output 1905 ml  Net 62.73 ml   Filed Weights   10/30/17 0340 10/31/17 0016 10/31/17 0423  Weight: 90.5 kg 90.1 kg 90.1 kg    Examination:  General:  In bed on vent HENT: NCAT ETT in place PULM: CTA B, vent supported breathing CV: RRR, no mgr GI: BS+, soft, nontender MSK: normal bulk and tone Neuro: sedated on vent    Resolved Hospital Problem list     Assessment & Plan:  Acute respiratory failure with hypoxemia, left hemidiaphragm Likely has bronchiectasis with acute exacerbation based on family history with MRSA > continue full vent support > add vancomycin > VAP  prevention  Acute pulmonary edema > diurese  Acute metabolic encephalopathy due to EtOH withdrawal > precedex infusion, prn versed  Dilated cardiomyopathy > diurese today with furosemide 40mg  IV q6h x2 > hold metoprolol  Afib, rate controlled > ASA 81mg  for stroke prevention  Left Olecranon Fracture s/p ORIF > Per Ortho  Disposition / Summary of Today's Plan 10/31/17   Read above     Diet: tube feeding Pain/Anxiety/Delirium protocol (if indicated): PAD protocol VAP protocol (if indicated): yes DVT prophylaxis: lovenox GI prophylaxis: famotidine Hyperglycemia protocol: SSI Mobility: bed rest Code Status: full Family Communication: updated wife bedside  Labs   CBC: Recent Labs  Lab 10/27/17 2259 10/28/17 0507 10/29/17 0417 10/30/17 0324 10/31/17 0323  WBC 4.3 5.5 6.4 10.1 10.1  HGB 10.3* 10.7* 10.3* 10.3* 10.1*  HCT 31.9* 32.4* 31.7* 30.9* 30.9*  MCV 87.2 85.7 88.5 86.3 88.0  PLT 192 202 229 246 865    Basic Metabolic Panel: Recent Labs  Lab 10/27/17 1722 10/27/17 2259 10/28/17 0507 10/29/17 0417 10/30/17 0324 10/31/17 0323  NA 134*  --  132* 137 134* 132*  K 2.9*  --  3.5 3.3* 3.1* 3.4*  CL 93*  --  96* 101 102 102  CO2 24  --  23 27 23 23   GLUCOSE 112*  --  185* 131* 137* 138*  BUN 20  --  19 26* 23 23  CREATININE 1.08 1.15 1.08 1.14 0.86 0.77  CALCIUM 8.2*  --  8.0* 7.9* 7.6* 7.9*  MG  --   --   --  1.6* 1.3* 1.6*  PHOS  --   --   --  2.3* 3.0 3.0   GFR: Estimated Creatinine Clearance: 87.3 mL/min (by C-G formula based on SCr of 0.77 mg/dL). Recent Labs  Lab 10/28/17 0507 10/29/17 0417 10/30/17 0324 10/31/17 0323  WBC 5.5 6.4 10.1 10.1    Liver Function Tests: Recent Labs  Lab 10/27/17 1722  AST 50*  ALT 25  ALKPHOS 96  BILITOT 1.4*  PROT 5.9*  ALBUMIN 3.0*   No results for input(s): LIPASE, AMYLASE in the last 168 hours. No results for input(s): AMMONIA in the last 168 hours.  ABG    Component Value Date/Time   PHART  7.449 12/26/2009 1920   PCO2ART 25.3 (L) 12/26/2009 1920   PO2ART 73.0 (L) 12/26/2009 1920   HCO3 17.6 (L) 12/26/2009 1920   TCO2 22 11/02/2011 1022   ACIDBASEDEF 5.0 (H) 12/26/2009 1920   O2SAT 96.0 12/26/2009 1920     Coagulation Profile: No results for input(s): INR, PROTIME in the last 168 hours.  Cardiac Enzymes: Recent Labs  Lab 10/27/17 1722 10/27/17 2259 10/28/17 0507  TROPONINI <0.03 <0.03 0.03*    HbA1C: Hgb A1c MFr Bld  Date/Time Value Ref Range Status  03/03/2012 12:00 PM 5.6 4.6 - 6.5 % Final    Comment:    Glycemic Control Guidelines for People with Diabetes:Non Diabetic:  <6%Goal of Therapy: <7%Additional Action Suggested:  >8%     CBG: Recent Labs  Lab 10/30/17 1931 10/31/17 0011 10/31/17 0338 10/31/17 0704 10/31/17 1138  GLUCAP 143* 152* 126* 126* 123*     Critical care time: 35 minutes    Roselie Awkward, MD [facility] Pager: [phone] Cell: [phone] After 3pm or if no response, call [phone]

## 2017-10-31 NOTE — Progress Notes (Signed)
OT Cancellation Note  Patient Details Name: MORDECHAI MATUSZAK MRN: [id number] DOB: [birth year]   Cancelled Treatment:    Reason Eval/Treat Not Completed: Medical issues which prohibited therapy.  Pt intubated.  Will reattempt.  Lucille Passy, OTR/L Acute Rehabilitation Services Pager [phone] Office [phone]   Lucille Passy M 10/31/2017, 5:14 AM

## 2017-10-31 NOTE — Progress Notes (Signed)
[facility] Progress Note Patient Name: [name] DOB: 01/27/1945 MRN: [id number]   Date of Service  10/31/2017  HPI/Events of Note  Low K and mg  eICU Interventions  replaced     Intervention Category Intermediate Interventions: Electrolyte abnormality - evaluation and management  Mauri Brooklyn, P 10/31/2017, 5:46 AM

## 2017-10-31 NOTE — Progress Notes (Signed)
PT Cancellation Note  Patient Details Name: [name] MRN: [id number] DOB: August 03, 1945   Cancelled Treatment:    Reason Eval/Treat Not Completed: Medical issues which prohibited therapy.  Pt is likely de-toxing, still ventilated. PT will continue to follow acutely for medical readiness for therapies.  Thanks,    Barbarann Ehlers. Trey Bebee, PT, DPT  Acute Rehabilitation [phone] pager #[phone] office   10/31/2017, 4:36 PM

## 2017-10-31 NOTE — Progress Notes (Signed)
Spoke with [facility]. Notified that pt has had multiple PIV started and recommended PICC line. PICC line order has been received. PICC nurse will now assess for appropriateness of placing PICC line. At this time no need for further PIV. Consult will be completed. Fran Lowes, RN VAST

## 2017-10-31 NOTE — Progress Notes (Signed)
Pharmacy Antibiotic Note  [name] is a 72 y.o. male admitted on 10/27/2017 with pneumonia.  Pharmacy has been consulted for Vancomycin dosing.  Plan: Vancomycin 1750 mg IV x 1, then Vancomycin 1000 mg IV every 12 hours.  Goal trough 15-20 mcg/mL.  Will monitor renal function, cultures and sensitivities. Will check vancomycin levels at steady state.   Height: 5\' 5"  (165.1 cm) Weight: 198 lb 10.2 oz (90.1 kg) IBW/kg (Calculated) : 61.5  Temp (24hrs), Avg:98.9 F (37.2 C), Min:98.2 F (36.8 C), Max:99.9 F (37.7 C)  Recent Labs  Lab 10/27/17 2259 10/28/17 0507 10/29/17 0417 10/30/17 0324 10/31/17 0323  WBC 4.3 5.5 6.4 10.1 10.1  CREATININE 1.15 1.08 1.14 0.86 0.77    Estimated Creatinine Clearance: 87.3 mL/min (by C-G formula based on SCr of 0.77 mg/dL).    Allergies  Allergen Reactions  . Nifedipine Swelling    11/02/2011 pt denies this allergy (??)  . Bee Venom Swelling    Antimicrobials this admission: Vnaco 10/21 >>   Microbiology results: 10/20 Sputum: moderate Staph aureus  10/17 MRSA PCR: POS  Thank you for allowing pharmacy to be a part of this patient's care.  Alanda Slim, PharmD, [facility] Clinical Pharmacist Please see AMION for all Pharmacists' Contact Phone Numbers 10/31/2017, 3:47 PM

## 2017-10-31 NOTE — Progress Notes (Signed)
Subjective: 4 Days Post-Op Procedure(s) (LRB): OPEN REDUCTION INTERNAL FIXATION (ORIF) LEFT OLECRANON (Left) Patient is intubated.  Tried to be extubated this am without success.  Objective: Vital signs in last 24 hours: Temp:  [98.2 F (36.8 C)-100.2 F (37.9 C)] 98.7 F (37.1 C) (10/21 0700) Pulse Rate:  [57-96] 91 (10/21 1000) Resp:  [14-26] 18 (10/21 1000) BP: (101-161)/(64-90) 128/87 (10/21 1000) SpO2:  [89 %-100 %] 95 % (10/21 1000) FiO2 (%):  [30 %] 30 % (10/21 0927) Weight:  [90.1 kg] 90.1 kg (10/21 0423)  Intake/Output from previous day: 10/20 0701 - 10/21 0700 In: 2638.5 [I.V.:704.9; NG/GT:1320; IV Piggyback:613.6] Out: 3110 [Urine:2860; Emesis/NG output:250] Intake/Output this shift: Total I/O In: 255.3 [I.V.:22.6; NG/GT:150; IV Piggyback:82.7] Out: 215 [Urine:215]  Recent Labs    10/29/17 0417 10/30/17 0324 10/31/17 0323  HGB 10.3* 10.3* 10.1*   Recent Labs    10/30/17 0324 10/31/17 0323  WBC 10.1 10.1  RBC 3.58* 3.51*  HCT 30.9* 30.9*  PLT 246 252   Recent Labs    10/30/17 0324 10/31/17 0323  NA 134* 132*  K 3.1* 3.4*  CL 102 102  CO2 23 23  BUN 23 23  CREATININE 0.86 0.77  GLUCOSE 137* 138*  CALCIUM 7.6* 7.9*   No results for input(s): LABPT, INR in the last 72 hours.  Incision: dressing C/D/I  Splint is in place Patient in LUE restraint and sling not functioning    Assessment/Plan: 4 Days Post-Op  NWB LUE Continue plan per ccu Will continue to follow until d/c     Aundra Dubin 10/31/2017, 10:11 AM

## 2017-11-01 ENCOUNTER — Inpatient Hospital Stay (HOSPITAL_COMMUNITY): Payer: Medicare Other

## 2017-11-01 LAB — GLUCOSE, CAPILLARY
GLUCOSE-CAPILLARY: 137 mg/dL — AB (ref 70–99)
GLUCOSE-CAPILLARY: 146 mg/dL — AB (ref 70–99)
GLUCOSE-CAPILLARY: 121 mg/dL — AB (ref 70–99)
Glucose-Capillary: 123 mg/dL — ABNORMAL HIGH (ref 70–99)
Glucose-Capillary: 137 mg/dL — ABNORMAL HIGH (ref 70–99)
Glucose-Capillary: 147 mg/dL — ABNORMAL HIGH (ref 70–99)

## 2017-11-01 LAB — CULTURE, RESPIRATORY W GRAM STAIN: Special Requests: NORMAL

## 2017-11-01 LAB — BASIC METABOLIC PANEL
ANION GAP: 9 (ref 5–15)
BUN: 18 mg/dL (ref 8–23)
CALCIUM: 8.3 mg/dL — AB (ref 8.9–10.3)
CO2: 25 mmol/L (ref 22–32)
Chloride: 98 mmol/L (ref 98–111)
Creatinine, Ser: 0.78 mg/dL (ref 0.61–1.24)
Glucose, Bld: 147 mg/dL — ABNORMAL HIGH (ref 70–99)
Potassium: 3 mmol/L — ABNORMAL LOW (ref 3.5–5.1)
SODIUM: 132 mmol/L — AB (ref 135–145)

## 2017-11-01 LAB — MAGNESIUM: MAGNESIUM: 1.4 mg/dL — AB (ref 1.7–2.4)

## 2017-11-01 LAB — CULTURE, RESPIRATORY

## 2017-11-01 MED ORDER — RACEPINEPHRINE HCL 2.25 % IN NEBU
INHALATION_SOLUTION | RESPIRATORY_TRACT | Status: AC
  Administered 2017-11-01: 0.5 mL via RESPIRATORY_TRACT
  Filled 2017-11-01: qty 0.5

## 2017-11-01 MED ORDER — ETOMIDATE 2 MG/ML IV SOLN
20.0000 mg | Freq: Once | INTRAVENOUS | Status: AC
  Administered 2017-11-01: 20 mg via INTRAVENOUS

## 2017-11-01 MED ORDER — RACEPINEPHRINE HCL 2.25 % IN NEBU
0.5000 mL | INHALATION_SOLUTION | Freq: Once | RESPIRATORY_TRACT | Status: AC
  Administered 2017-11-01 (×2): 0.5 mL via RESPIRATORY_TRACT

## 2017-11-01 MED ORDER — SODIUM CHLORIDE 3 % IN NEBU
4.0000 mL | INHALATION_SOLUTION | Freq: Two times a day (BID) | RESPIRATORY_TRACT | Status: DC
  Filled 2017-11-01: qty 4

## 2017-11-01 MED ORDER — FUROSEMIDE 10 MG/ML IJ SOLN
40.0000 mg | Freq: Two times a day (BID) | INTRAMUSCULAR | Status: DC
  Administered 2017-11-01 – 2017-11-07 (×11): 40 mg via INTRAVENOUS
  Filled 2017-11-01 (×11): qty 4

## 2017-11-01 MED ORDER — MIDAZOLAM HCL 2 MG/2ML IJ SOLN
INTRAMUSCULAR | Status: AC
  Administered 2017-11-01: 2 mg
  Filled 2017-11-01: qty 2

## 2017-11-01 MED ORDER — ADULT MULTIVITAMIN LIQUID CH
15.0000 mL | Freq: Every day | ORAL | Status: DC
  Administered 2017-11-01 – 2017-11-08 (×8): 15 mL via ORAL
  Filled 2017-11-01 (×8): qty 15

## 2017-11-01 MED ORDER — POTASSIUM CHLORIDE 20 MEQ/15ML (10%) PO SOLN
40.0000 meq | Freq: Two times a day (BID) | ORAL | Status: AC
  Administered 2017-11-01 (×2): 40 meq via ORAL
  Filled 2017-11-01 (×2): qty 30

## 2017-11-01 MED ORDER — CALCIUM GLUCONATE-NACL 1-0.675 GM/50ML-% IV SOLN: 1.0000 g | Freq: Once | INTRAVENOUS | Status: DC

## 2017-11-01 MED ORDER — SODIUM CHLORIDE 3 % IN NEBU
4.0000 mL | INHALATION_SOLUTION | Freq: Two times a day (BID) | RESPIRATORY_TRACT | Status: DC
  Administered 2017-11-01 – 2017-11-05 (×9): 4 mL via RESPIRATORY_TRACT
  Filled 2017-11-01 (×11): qty 4

## 2017-11-01 MED ORDER — FENTANYL 2500MCG IN NS 250ML (10MCG/ML) PREMIX INFUSION
0.0000 ug/h | INTRAVENOUS | Status: DC
  Administered 2017-11-01: 25 ug/h via INTRAVENOUS
  Administered 2017-11-02: 75 ug/h via INTRAVENOUS
  Filled 2017-11-01 (×2): qty 250

## 2017-11-01 MED ORDER — GUAIFENESIN 100 MG/5ML PO SOLN
5.0000 mL | Freq: Four times a day (QID) | ORAL | Status: DC
  Administered 2017-11-01 – 2017-11-03 (×7): 100 mg via ORAL
  Filled 2017-11-01 (×9): qty 5

## 2017-11-01 NOTE — Progress Notes (Signed)
PT Cancellation Note  Patient Details Name: [name] MRN: [id number] DOB: 08-Dec-1945   Cancelled Treatment:    Reason Eval/Treat Not Completed: Medical issues which prohibited therapy.  Pt self extubated and had to be re-intubated.  PT will continue to follow for further medical stability.  Thanks,    Barbarann Ehlers. Rashema Seawright, PT, DPT  Acute Rehabilitation [phone] pager [phone] office   11/01/2017, 1:37 PM

## 2017-11-01 NOTE — Progress Notes (Signed)
Attending:    Subjective: Agitated overnight Self extubated Diuresed some overnight  Objective: Vitals:   11/01/17 1215 11/01/17 1230 11/01/17 1245 11/01/17 1300  BP: (!) 144/68 (!) 183/77 (!) 191/58 (!) 171/65  Pulse: 74 81 73 74  Resp: 18 (!) 22 19 16   Temp:   99.8 F (37.7 C)   TempSrc:   Axillary   SpO2: 97% 100% 98% 98%  Weight:      Height:       Vent Mode: PRVC FiO2 (%):  [30 %] 30 % Set Rate:  [18 bmp] 18 bmp Vt Set:  [490 mL] 490 mL PEEP:  [5 cmH20] 5 cmH20 Pressure Support:  [15 cmH20] 15 cmH20 Plateau Pressure:  [17 cmH20-24 cmH20] 17 cmH20  Intake/Output Summary (Last 24 hours) at 11/01/2017 1327 Last data filed at 11/01/2017 1300 Gross per 24 hour  Intake 2867.45 ml  Output 4395 ml  Net -1527.55 ml    General:  In bed agitated HENT: NCAT OP clear PULM: Crackles B, increased respiratory effort CV: Tachycardic, no mgr GI: BS+, soft, nontender MSK: normal bulk and tone Neuro: awake, not following commands   CBC    Component Value Date/Time   WBC 10.1 10/31/2017 0323   RBC 3.51 (L) 10/31/2017 0323   HGB 10.1 (L) 10/31/2017 0323   HCT 30.9 (L) 10/31/2017 0323   PLT 252 10/31/2017 0323   MCV 88.0 10/31/2017 0323   MCH 28.8 10/31/2017 0323   MCHC 32.7 10/31/2017 0323   RDW 16.3 (H) 10/31/2017 0323   LYMPHSABS 0.5 (L) 10/21/2017 1807   MONOABS 0.9 10/21/2017 1807   EOSABS 0.1 10/21/2017 1807   BASOSABS 0.0 10/21/2017 1807    BMET    Component Value Date/Time   NA 132 (L) 11/01/2017 0445   NA 138 06/04/2016   K 3.0 (L) 11/01/2017 0445   CL 98 11/01/2017 0445   CO2 25 11/01/2017 0445   GLUCOSE 147 (H) 11/01/2017 0445   BUN 18 11/01/2017 0445   BUN 16 06/04/2016   CREATININE 0.78 11/01/2017 0445   CREATININE 0.95 05/16/2015 0917   CALCIUM 8.3 (L) 11/01/2017 0445   GFRNONAA >60 11/01/2017 0445   GFRAA >60 11/01/2017 0445    CXR images personally reviewed > bilateral airspace disease  Impression/Plan:  Acute respiratory failure  with hypoxemia> re-intubate, full vent support, continue sedation as needed to maintain RASS goal -1 to -2 MRSA bronchitis> suspect bronchiectasis> will need HRCT this admission, continue vancomycin Elbow fracture> per ortho Alcoholic encephalopathy/ICU delirium/toxic metabolic encephalopathy> precedex, PAD protocol, start seroquel   Will place on PCCM service  My cc time 45 minutes  Roselie Awkward, MD [facility] Pager: [phone] Cell: [phone] After 3pm or if no response, call [phone]

## 2017-11-01 NOTE — Progress Notes (Signed)
Subjective: 5 Days Post-Op Procedure(s) (LRB): OPEN REDUCTION INTERNAL FIXATION (ORIF) LEFT OLECRANON (Left) Patient appears to be following more commands today, but remains agitated.   Objective: Vital signs in last 24 hours: Temp:  [98.8 F (37.1 C)-100 F (37.8 C)] 99.4 F (37.4 C) (10/22 0806) Pulse Rate:  [62-127] 74 (10/22 0800) Resp:  [11-30] 19 (10/22 0800) BP: (107-166)/(35-95) 121/45 (10/22 0800) SpO2:  [89 %-100 %] 91 % (10/22 0800) FiO2 (%):  [30 %] 30 % (10/22 0400) Weight:  [90.1 kg] 90.1 kg (10/22 0446)  Intake/Output from previous day: 10/21 0701 - 10/22 0700 In: 2955 [I.V.:755.9; NG/GT:1185; IV Piggyback:1014.1] Out: 4540 [Urine:4540] Intake/Output this shift: No intake/output data recorded.  Recent Labs    10/30/17 0324 10/31/17 0323  HGB 10.3* 10.1*   Recent Labs    10/30/17 0324 10/31/17 0323  WBC 10.1 10.1  RBC 3.58* 3.51*  HCT 30.9* 30.9*  PLT 246 252   Recent Labs    10/31/17 0323 11/01/17 0445  NA 132* 132*  K 3.4* 3.0*  CL 102 98  CO2 23 25  BUN 23 18  CREATININE 0.77 0.78  GLUCOSE 138* 147*  CALCIUM 7.9* 8.3*   No results for input(s): LABPT, INR in the last 72 hours.  Incision: dressing C/D/I  Sling not functioning (patient in 4-point restraints) Splint in place   Assessment/Plan: 5 Days Post-Op Procedure(s) (LRB): OPEN REDUCTION INTERNAL FIXATION (ORIF) LEFT OLECRANON (Left)  NWB LUE Sling if possible Continue cc plan      Aundra Dubin 11/01/2017, 8:07 AM

## 2017-11-01 NOTE — Progress Notes (Addendum)
NAME:  [name], MRN:  [id number], DOB:  1945/02/10, LOS: 5 ADMISSION DATE:  10/27/2017, CONSULTATION DATE:  10/27/17 REFERRING MD:  Erlinda Hong, CHIEF COMPLAINT:  Fall   Brief History   53 male presented to [facility] on 10/17 for a left olecranon fracture.  Post op was unable to be extubated.   Past Medical History  EtOH abuse, PAD, CAD, HTN, afib, dilated cardiomyopathy, GERD Significant Hospital Events   10/17 admit for repair of left olecranon fracture  Consults: date of consult/date signed off & final recs:  10/17 PCCM   Procedures (surgical and bedside):  10/17 ORIF  Significant Diagnostic Tests:  10/11 L elbow x-ray> broken  Micro Data:  resp culture > staph aureus  Antimicrobials:  10/21 vanc >    Subjective:  Remains mechanically ventilated, currently weaning sedation and on PS 15/5, pulling good TV, rate is 31. Breakthrough agitation. Received 300 mg fentanyl overnight and 6 mg ativan. Currently on Precedex 1.6, weaned from 2  Objective   Blood pressure (!) 121/45, pulse 86, temperature 99.4 F (37.4 C), temperature source Oral, resp. rate (!) 22, height 5\' 5"  (1.651 m), weight 90.1 kg, SpO2 91 %.    Vent Mode: PRVC FiO2 (%):  [30 %] 30 % Set Rate:  [18 bmp] 18 bmp Vt Set:  [490 mL] 490 mL PEEP:  [5 cmH20] 5 cmH20 Pressure Support:  [15 cmH20] 15 cmH20 Plateau Pressure:  [13 cmH20-24 cmH20] 22 cmH20   Intake/Output Summary (Last 24 hours) at 11/01/2017 7846 Last data filed at 11/01/2017 0600 Gross per 24 hour  Intake 2804.7 ml  Output 4540 ml  Net -1735.3 ml   Filed Weights   10/31/17 0016 10/31/17 0423 11/01/17 0446  Weight: 90.1 kg 90.1 kg 90.1 kg    Examination:  General:  In bed sedated and intubated, follows commands, weaning on 15/5 HENT: NCAT ETT in place, secure, OG tube ,NCAT, No LAD PULM: Bilateral excursion, clear throughout , diminished per bases CV:S1, S2,  RRR, no mgr, rate controlled fib  per tele GI: BS+, soft, nontender, ND, BS + MSK:  normal bulk and tone, L arm in cast Neuro: sedated on vent, follows commands, MAE x 4    Resolved Hospital Problem list     Assessment & Plan:  Acute respiratory failure with hypoxemia, left hemidiaphragm Likely has bronchiectasis with acute exacerbation based on family history with MRSA>> vanc sensitive > PS trials > Continue  Vancomycin>> Follow micro for sensitivities > VAP prevention > Maintain sats > 94% > Consider Chest PT > Consider 3% saline nebs if secretions become thicker > Consider CT chest .  Acute pulmonary edema > diurese 10/21 with net negative of 1500 cc's > CXR daily and prn  Acute metabolic encephalopathy due to EtOH withdrawal > precedex infusion, prn versed > Ativan for WD TID  Dilated cardiomyopathy > no further diuresis today > hold metoprolol  Hyponatremia  ? Chronic 2/2 ETOH  > Minimize free water > Trend BMET  Afib, rate controlled > ASA 81mg  for stroke prevention  Left Olecranon Fracture s/p ORIF > Per Ortho > Capillary refill checks  Disposition / Summary of Today's Plan 11/01/17   As above, plan to wean sedation and PS trials with goal of liberation from mechanical ventilation     Diet: tube feeding Pain/Anxiety/Delirium protocol (if indicated): PAD protocol VAP protocol (if indicated): yes DVT prophylaxis: lovenox GI prophylaxis: famotidine Hyperglycemia protocol: SSI Mobility: bed rest Code Status: full Family Communication: updated wife bedside  Labs   CBC: Recent Labs  Lab 10/27/17 2259 10/28/17 0507 10/29/17 0417 10/30/17 0324 10/31/17 0323  WBC 4.3 5.5 6.4 10.1 10.1  HGB 10.3* 10.7* 10.3* 10.3* 10.1*  HCT 31.9* 32.4* 31.7* 30.9* 30.9*  MCV 87.2 85.7 88.5 86.3 88.0  PLT 192 202 229 246 034    Basic Metabolic Panel: Recent Labs  Lab 10/28/17 0507 10/29/17 0417 10/30/17 0324 10/31/17 0323 11/01/17 0445  NA 132* 137 134* 132* 132*  K 3.5 3.3* 3.1* 3.4* 3.0*  CL 96* 101 102 102 98  CO2 23 27 23 23 25     GLUCOSE 185* 131* 137* 138* 147*  BUN 19 26* 23 23 18   CREATININE 1.08 1.14 0.86 0.77 0.78  CALCIUM 8.0* 7.9* 7.6* 7.9* 8.3*  MG  --  1.6* 1.3* 1.6*  --   PHOS  --  2.3* 3.0 3.0  --    GFR: Estimated Creatinine Clearance: 87.3 mL/min (by C-G formula based on SCr of 0.78 mg/dL). Recent Labs  Lab 10/28/17 0507 10/29/17 0417 10/30/17 0324 10/31/17 0323  WBC 5.5 6.4 10.1 10.1    Liver Function Tests: Recent Labs  Lab 10/27/17 1722  AST 50*  ALT 25  ALKPHOS 96  BILITOT 1.4*  PROT 5.9*  ALBUMIN 3.0*   No results for input(s): LIPASE, AMYLASE in the last 168 hours. No results for input(s): AMMONIA in the last 168 hours.  ABG    Component Value Date/Time   PHART 7.449 12/26/2009 1920   PCO2ART 25.3 (L) 12/26/2009 1920   PO2ART 73.0 (L) 12/26/2009 1920   HCO3 17.6 (L) 12/26/2009 1920   TCO2 22 11/02/2011 1022   ACIDBASEDEF 5.0 (H) 12/26/2009 1920   O2SAT 96.0 12/26/2009 1920     Coagulation Profile: No results for input(s): INR, PROTIME in the last 168 hours.  Cardiac Enzymes: Recent Labs  Lab 10/27/17 1722 10/27/17 2259 10/28/17 0507  TROPONINI <0.03 <0.03 0.03*    HbA1C: Hgb A1c MFr Bld  Date/Time Value Ref Range Status  03/03/2012 12:00 PM 5.6 4.6 - 6.5 % Final    Comment:    Glycemic Control Guidelines for People with Diabetes:Non Diabetic:  <6%Goal of Therapy: <7%Additional Action Suggested:  >8%     CBG: Recent Labs  Lab 10/31/17 1138 10/31/17 1532 10/31/17 1937 10/31/17 2356 11/01/17 0432  GLUCAP 123* 115* 113* 137* 137*     Critical care time: 35 minutes    Magdalen Spatz, AGACNP-BC [place] PCCM Pager: [phone] 11/01/2017 8:22 AM

## 2017-11-01 NOTE — Progress Notes (Signed)
I have spoken with the patient's wife. She states the patient has had DNR status in the past. She states he has told her he does not want to be a sustained on life support if he is unable to have quality of life.She states she wants to make him  a full code for now. If he has an event, and his status changes, she most likely will make him a DNR. She is very realistic. She does not want long term support.  I explained that she can change Code status at any time based on his condition . She does not want a trach. She states he would not want that. I explained we usually start talking about the need for trach's at about 11 days. She verbalized understanding. I have placed the Full Code order in the system.  Magdalen Spatz, AGACNP-BC [facility] Pulmonary/Critical Care Medicine Pager # [phone] 11/01/2017 4:22 PM

## 2017-11-01 NOTE — Procedures (Signed)
Intubation Procedure Note [name] [id number] 12/09/1945  Procedure: Intubation Indications: Respiratory insufficiency  Procedure Details Consent: Unable to obtain consent because of altered level of consciousness. Time Out: Verified patient identification, verified procedure, site/side was marked, verified correct patient position, special equipment/implants available, medications/allergies/relevent history reviewed, required imaging and test results available.  Performed  MAC Medications:  Fentanyl  100 mcg Etomidate 20 mg Versed 2 mg NMB    Evaluation Hemodynamic Status: BP stable throughout; O2 sats: stable throughout Patient's Current Condition: stable Complications: No apparent complications Patient did tolerate procedure well. Chest X-ray ordered to verify placement.  CXR: pending.   Magdalen Spatz, AGACNP [facility] Pager [phone] till 3 pm If no answer page [phone] 11/01/2017, 11:47 AM

## 2017-11-01 NOTE — Progress Notes (Signed)
OT Cancellation Note  Patient Details Name: KIMBLE HITCHENS MRN: [id number] DOB: [birth year]   Cancelled Treatment:    Reason Eval/Treat Not Completed: Medical issues which prohibited therapy.  Intubated and sedated.  Lucille Passy, OTR/L Acute Rehabilitation Services Pager [phone] Office [phone]   Lucille Passy M 11/01/2017, 9:25 AM

## 2017-11-01 NOTE — Progress Notes (Signed)
Responded to pt room due to vent alarm and found that pt had self extubated.  Finished removing et tube and noticed that pt has minor stridor. Placed pt on nasal cannula and gave racemic epi.  Pt is maintaining airway at this time and has strong cough.  RN at bedside.  Will continue to monitor.

## 2017-11-01 NOTE — Progress Notes (Signed)
To bs w/ RT, patient managed to partially withdraw ETT. Sedation off immediately, TF d/c and scant amt of content aspirated to ward off aspiration risk. Precedex restarted at minimal amt as pt tachypneic and agitated. MD and NP at bs immediately and orders recvd. Pt failed to be able to sustain effective respirations and reintubated.

## 2017-11-02 ENCOUNTER — Inpatient Hospital Stay (HOSPITAL_COMMUNITY): Payer: Medicare Other

## 2017-11-02 DIAGNOSIS — J9601 Acute respiratory failure with hypoxia: Secondary | ICD-10-CM

## 2017-11-02 DIAGNOSIS — Z4659 Encounter for fitting and adjustment of other gastrointestinal appliance and device: Secondary | ICD-10-CM

## 2017-11-02 DIAGNOSIS — R0689 Other abnormalities of breathing: Secondary | ICD-10-CM

## 2017-11-02 LAB — BASIC METABOLIC PANEL
Anion gap: 11 (ref 5–15)
BUN: 24 mg/dL — ABNORMAL HIGH (ref 8–23)
CHLORIDE: 98 mmol/L (ref 98–111)
CO2: 22 mmol/L (ref 22–32)
CREATININE: 0.75 mg/dL (ref 0.61–1.24)
Calcium: 7.7 mg/dL — ABNORMAL LOW (ref 8.9–10.3)
GFR calc Af Amer: >60 mL/min (ref >60)
GFR calc non Af Amer: >60 mL/min (ref >60)
Glucose, Bld: 254 mg/dL — ABNORMAL HIGH (ref 70–99)
POTASSIUM: 2.9 mmol/L — AB (ref 3.5–5.1)
SODIUM: 131 mmol/L — AB (ref 135–145)

## 2017-11-02 LAB — GLUCOSE, CAPILLARY
GLUCOSE-CAPILLARY: 108 mg/dL — AB (ref 70–99)
GLUCOSE-CAPILLARY: 107 mg/dL — AB (ref 70–99)
GLUCOSE-CAPILLARY: 103 mg/dL — AB (ref 70–99)
Glucose-Capillary: 110 mg/dL — ABNORMAL HIGH (ref 70–99)
Glucose-Capillary: 119 mg/dL — ABNORMAL HIGH (ref 70–99)
Glucose-Capillary: 123 mg/dL — ABNORMAL HIGH (ref 70–99)
Glucose-Capillary: 136 mg/dL — ABNORMAL HIGH (ref 70–99)

## 2017-11-02 LAB — CBC
HCT: 29 % — ABNORMAL LOW (ref 39.0–52.0)
Hemoglobin: 9.3 g/dL — ABNORMAL LOW (ref 13.0–17.0)
MCH: 28.1 pg (ref 26.0–34.0)
MCHC: 32.1 g/dL (ref 30.0–36.0)
MCV: 87.6 fL (ref 80.0–100.0)
Platelets: 293 K/uL (ref 150–400)
RBC: 3.31 MIL/uL — ABNORMAL LOW (ref 4.22–5.81)
RDW: 16 % — ABNORMAL HIGH (ref 11.5–15.5)
WBC: 13.6 K/uL — ABNORMAL HIGH (ref 4.0–10.5)
nRBC: 0 % (ref 0.0–0.2)

## 2017-11-02 LAB — COMPREHENSIVE METABOLIC PANEL
ALK PHOS: 225 U/L — AB (ref 38–126)
ALT: 52 U/L — AB (ref 0–44)
ANION GAP: 8 (ref 5–15)
AST: 71 U/L — ABNORMAL HIGH (ref 15–41)
Albumin: 2.2 g/dL — ABNORMAL LOW (ref 3.5–5.0)
BILIRUBIN TOTAL: 1 mg/dL (ref 0.3–1.2)
BUN: 23 mg/dL (ref 8–23)
CALCIUM: 8.3 mg/dL — AB (ref 8.9–10.3)
CO2: 24 mmol/L (ref 22–32)
CREATININE: 0.78 mg/dL (ref 0.61–1.24)
Chloride: 102 mmol/L (ref 98–111)
Glucose, Bld: 150 mg/dL — ABNORMAL HIGH (ref 70–99)
Potassium: 3.5 mmol/L (ref 3.5–5.1)
Sodium: 134 mmol/L — ABNORMAL LOW (ref 135–145)
TOTAL PROTEIN: 5.8 g/dL — AB (ref 6.5–8.1)

## 2017-11-02 LAB — PROCALCITONIN: Procalcitonin: 0.19 ng/mL

## 2017-11-02 MED ORDER — MAGNESIUM SULFATE 2 GM/50ML IV SOLN
2.0000 g | Freq: Once | INTRAVENOUS | Status: AC
  Administered 2017-11-02: 2 g via INTRAVENOUS
  Filled 2017-11-02: qty 50

## 2017-11-02 NOTE — Progress Notes (Signed)
NAME:  [name], MRN:  [id number], DOB:  1945/06/13, LOS: 6 ADMISSION DATE:  10/27/2017, CONSULTATION DATE:  10/27/17 REFERRING MD:  Erlinda Hong, CHIEF COMPLAINT:  Fall   Brief History   50 male presented to [facility] on 10/17 for a left olecranon fracture.  Post op was unable to be extubated.   Past Medical History  EtOH abuse, PAD, CAD, HTN, afib, dilated cardiomyopathy, GERD Significant Hospital Events   10/17 admit for repair of left olecranon fracture 10/18 unable to extubate, admitted to [facility] 10/22 self-extubated, failed spontaneous breathing, reintubated  Consults: date of consult/date signed off & final recs:  10/17 PCCM   Procedures (surgical and bedside):  10/17 ORIF  Significant Diagnostic Tests:  10/11 L elbow x-ray> broken  Micro Data:  resp culture > staph aureus > vanc sens  Antimicrobials:  Vanc 10/21 >   Cefazolin 10/17 >10/17  Subjective:  Remains mechanically ventilated, currently weaning sedation and on PS 15/5. Breakthrough agitation. Currently on Precedex 2  Objective   Blood pressure (!) 135/51, pulse (!) 57, temperature (!) 97.5 F (36.4 C), temperature source Oral, resp. rate 18, height 5\' 5"  (1.651 m), weight 85.3 kg, SpO2 99 %.    Vent Mode: PRVC FiO2 (%):  [30 %] 30 % Set Rate:  [18 bmp] 18 bmp Vt Set:  [490 mL-560 mL] 560 mL PEEP:  [5 cmH20] 5 cmH20 Pressure Support:  [15 cmH20] 15 cmH20 Plateau Pressure:  [16 cmH20-25 cmH20] 18 cmH20   Intake/Output Summary (Last 24 hours) at 11/02/2017 0804 Last data filed at 11/02/2017 0600 Gross per 24 hour  Intake 2932.87 ml  Output 1905 ml  Net 1027.87 ml   Filed Weights   10/31/17 0423 11/01/17 0446 11/02/17 0400  Weight: 90.1 kg 90.1 kg 85.3 kg    Examination:  General:  In bed sedated and intubated, weaning on 15/5, obese HENT: NCAT ETT in place, secure, OG tube ,NCAT, No LAD PULM: Bilateral excursion, course throughout , diminished per bases CV:S1, S2,  RRR, no mgr, rate controlled fib  per  tele GI: BS+, soft, nontender, ND, BS + MSK: normal bulk and tone, L arm in cast Neuro: sedated on vent, MAE x 4  Resolved Hospital Problem list     Assessment & Plan:  Acute respiratory failure with hypoxemia, left hemidiaphragm Likely has bronchiectasis with acute exacerbation based on family history with MRSA>> vanc sensitive > PS trials > Continue  Vancomycin>> vanc sensitive on culture > VAP prevention > Maintain sats > 94% > Consider Chest PT > Consider 3% saline nebs if secretions become thicker > Consider CT chest .  Acute pulmonary edema >  net positive of 1500 cc's > CXR daily and prn  Acute metabolic encephalopathy due to EtOH withdrawal- has been getting precedex/fentanyl, no versed overnight 10/22 > precedex infusion, prn versed > Ativan for WD TID  Dilated cardiomyopathy > no further diuresis today > hold metoprolol  Hyponatremia  ? Chronic 2/2 ETOH  > Minimize free water > Trend BMET  Afib, rate controlled > ASA 81mg  for stroke prevention  Left Olecranon Fracture s/p ORIF > Per Ortho > Capillary refill checks  Disposition / Summary of Today's Plan 11/02/17   As above, plan to wean sedation and PS trials with goal of liberation from mechanical ventilation    Diet: tube feeding Pain/Anxiety/Delirium protocol (if indicated): PAD protocol VAP protocol (if indicated): yes DVT prophylaxis: lovenox GI prophylaxis: famotidine Hyperglycemia protocol: can start SSI if needed Mobility: bed rest Code  Status: full Family Communication: updated wife bedside  Labs   CBC: Recent Labs  Lab 10/28/17 0507 10/29/17 0417 10/30/17 0324 10/31/17 0323 11/02/17 0355  WBC 5.5 6.4 10.1 10.1 13.6*  HGB 10.7* 10.3* 10.3* 10.1* 9.3*  HCT 32.4* 31.7* 30.9* 30.9* 29.0*  MCV 85.7 88.5 86.3 88.0 87.6  PLT 202 229 246 252 742    Basic Metabolic Panel: Recent Labs  Lab 10/29/17 0417 10/30/17 0324 10/31/17 0323 11/01/17 0445 11/01/17 0857 11/02/17 0355  NA  137 134* 132* 132*  --  134*  K 3.3* 3.1* 3.4* 3.0*  --  3.5  CL 101 102 102 98  --  102  CO2 27 23 23 25   --  24  GLUCOSE 131* 137* 138* 147*  --  150*  BUN 26* 23 23 18   --  23  CREATININE 1.14 0.86 0.77 0.78  --  0.78  CALCIUM 7.9* 7.6* 7.9* 8.3*  --  8.3*  MG 1.6* 1.3* 1.6*  --  1.4*  --   PHOS 2.3* 3.0 3.0  --   --   --    GFR: Estimated Creatinine Clearance: 85.1 mL/min (by C-G formula based on SCr of 0.78 mg/dL). Recent Labs  Lab 10/29/17 0417 10/30/17 0324 10/31/17 0323 11/02/17 0355  PROCALCITON  --   --   --  0.19  WBC 6.4 10.1 10.1 13.6*    Liver Function Tests: Recent Labs  Lab 10/27/17 1722 11/02/17 0355  AST 50* 71*  ALT 25 52*  ALKPHOS 96 225*  BILITOT 1.4* 1.0  PROT 5.9* 5.8*  ALBUMIN 3.0* 2.2*   No results for input(s): LIPASE, AMYLASE in the last 168 hours. No results for input(s): AMMONIA in the last 168 hours.  ABG    Component Value Date/Time   PHART 7.449 12/26/2009 1920   PCO2ART 25.3 (L) 12/26/2009 1920   PO2ART 73.0 (L) 12/26/2009 1920   HCO3 17.6 (L) 12/26/2009 1920   TCO2 22 11/02/2011 1022   ACIDBASEDEF 5.0 (H) 12/26/2009 1920   O2SAT 96.0 12/26/2009 1920     Coagulation Profile: No results for input(s): INR, PROTIME in the last 168 hours.  Cardiac Enzymes: Recent Labs  Lab 10/27/17 1722 10/27/17 2259 10/28/17 0507  TROPONINI <0.03 <0.03 0.03*    HbA1C: Hgb A1c MFr Bld  Date/Time Value Ref Range Status  03/03/2012 12:00 PM 5.6 4.6 - 6.5 % Final    Comment:    Glycemic Control Guidelines for People with Diabetes:Non Diabetic:  <6%Goal of Therapy: <7%Additional Action Suggested:  >8%     CBG: Recent Labs  Lab 11/01/17 1501 11/01/17 1913 11/02/17 0007 11/02/17 0349 11/02/17 0741  GLUCAP 146* 121* 123* 136* 119*     Critical care time:     Sherene Sires, [place] 11/02/2017 8:08 AM

## 2017-11-02 NOTE — Progress Notes (Signed)
PT Cancellation Note  Patient Details Name: RAJENDRA SPILLER MRN: [id number] DOB: 02/22/1945   Cancelled Treatment:    Reason Eval/Treat Not Completed: Medical issues which prohibited therapy Cont to be intubated and lethargic, will follow.    Reinaldo Berber, PT, DPT Acute Rehabilitation Services Pager: [phone] Office: [phone]     Reinaldo Berber 11/02/2017, 4:38 PM

## 2017-11-03 ENCOUNTER — Inpatient Hospital Stay (INDEPENDENT_AMBULATORY_CARE_PROVIDER_SITE_OTHER): Payer: Medicare Other | Admitting: Orthopaedic Surgery

## 2017-11-03 ENCOUNTER — Inpatient Hospital Stay (HOSPITAL_COMMUNITY): Payer: Medicare Other

## 2017-11-03 DIAGNOSIS — Z9289 Personal history of other medical treatment: Secondary | ICD-10-CM

## 2017-11-03 LAB — BASIC METABOLIC PANEL
Anion gap: 10 (ref 5–15)
Anion gap: 13 (ref 5–15)
BUN: 27 mg/dL — AB (ref 8–23)
BUN: 25 mg/dL — AB (ref 8–23)
CALCIUM: 8.8 mg/dL — AB (ref 8.9–10.3)
CO2: 23 mmol/L (ref 22–32)
CO2: 23 mmol/L (ref 22–32)
CREATININE: 0.8 mg/dL (ref 0.61–1.24)
CREATININE: 0.78 mg/dL (ref 0.61–1.24)
Calcium: 8.4 mg/dL — ABNORMAL LOW (ref 8.9–10.3)
Chloride: 101 mmol/L (ref 98–111)
Chloride: 98 mmol/L (ref 98–111)
GFR calc Af Amer: >60 mL/min (ref >60)
GFR calc Af Amer: >60 mL/min (ref >60)
GLUCOSE: 137 mg/dL — AB (ref 70–99)
GLUCOSE: 135 mg/dL — AB (ref 70–99)
POTASSIUM: 3.4 mmol/L — AB (ref 3.5–5.1)
Potassium: 3 mmol/L — ABNORMAL LOW (ref 3.5–5.1)
SODIUM: 134 mmol/L — AB (ref 135–145)
Sodium: 134 mmol/L — ABNORMAL LOW (ref 135–145)

## 2017-11-03 LAB — CBC
HCT: 28.1 % — ABNORMAL LOW (ref 39.0–52.0)
Hemoglobin: 9 g/dL — ABNORMAL LOW (ref 13.0–17.0)
MCH: 27.9 pg (ref 26.0–34.0)
MCHC: 32 g/dL (ref 30.0–36.0)
MCV: 87 fL (ref 80.0–100.0)
PLATELETS: 369 10*3/uL (ref 150–400)
RBC: 3.23 MIL/uL — AB (ref 4.22–5.81)
RDW: 16 % — AB (ref 11.5–15.5)
WBC: 12.4 10*3/uL — AB (ref 4.0–10.5)
nRBC: 0 % (ref 0.0–0.2)

## 2017-11-03 LAB — GLUCOSE, CAPILLARY
Glucose-Capillary: 127 mg/dL — ABNORMAL HIGH (ref 70–99)
Glucose-Capillary: 129 mg/dL — ABNORMAL HIGH (ref 70–99)
Glucose-Capillary: 108 mg/dL — ABNORMAL HIGH (ref 70–99)
Glucose-Capillary: 114 mg/dL — ABNORMAL HIGH (ref 70–99)

## 2017-11-03 LAB — MAGNESIUM: Magnesium: 1.6 mg/dL — ABNORMAL LOW (ref 1.7–2.4)

## 2017-11-03 LAB — VANCOMYCIN, TROUGH: VANCOMYCIN TR: 17 ug/mL (ref 15–20)

## 2017-11-03 LAB — PHOSPHORUS: Phosphorus: 4 mg/dL (ref 2.5–4.6)

## 2017-11-03 LAB — PROCALCITONIN: PROCALCITONIN: 0.21 ng/mL

## 2017-11-03 LAB — CALCIUM, IONIZED: CALCIUM, IONIZED, SERUM: 4.7 mg/dL (ref 4.5–5.6)

## 2017-11-03 MED ORDER — MAGNESIUM SULFATE IN D5W 1-5 GM/100ML-% IV SOLN: 1.0000 g | Freq: Once | INTRAVENOUS | Status: DC

## 2017-11-03 MED ORDER — HALOPERIDOL LACTATE 5 MG/ML IJ SOLN
5.0000 mg | Freq: Once | INTRAMUSCULAR | Status: AC
  Administered 2017-11-03: 5 mg via INTRAMUSCULAR
  Filled 2017-11-03: qty 1

## 2017-11-03 MED ORDER — FENTANYL CITRATE (PF) 100 MCG/2ML IJ SOLN
12.5000 ug | INTRAMUSCULAR | Status: DC | PRN
  Administered 2017-11-03 – 2017-11-04 (×4): 25 ug via INTRAVENOUS

## 2017-11-03 MED ORDER — FENTANYL 2500MCG IN NS 250ML (10MCG/ML) PREMIX INFUSION
0.0000 ug/h | INTRAVENOUS | Status: DC
  Administered 2017-11-03: 25 ug/h via INTRAVENOUS
  Administered 2017-11-04: 125 ug/h via INTRAVENOUS
  Filled 2017-11-03: qty 250

## 2017-11-03 MED ORDER — POTASSIUM CHLORIDE 20 MEQ PO PACK
40.0000 meq | PACK | Freq: Two times a day (BID) | ORAL | Status: DC
  Administered 2017-11-03: 40 meq via ORAL
  Filled 2017-11-03 (×2): qty 2

## 2017-11-03 MED ORDER — POTASSIUM CHLORIDE 20 MEQ PO PACK
40.0000 meq | PACK | ORAL | Status: AC
  Administered 2017-11-03 (×2): 40 meq via ORAL
  Filled 2017-11-03 (×2): qty 2

## 2017-11-03 MED ORDER — GUAIFENESIN 100 MG/5ML PO SOLN
5.0000 mL | Freq: Four times a day (QID) | ORAL | Status: DC
  Administered 2017-11-03 – 2017-11-09 (×14): 100 mg
  Filled 2017-11-03 (×27): qty 5

## 2017-11-03 MED ORDER — MAGNESIUM SULFATE 2 GM/50ML IV SOLN
2.0000 g | Freq: Once | INTRAVENOUS | Status: AC
  Administered 2017-11-03: 2 g via INTRAVENOUS
  Filled 2017-11-03: qty 50

## 2017-11-03 MED ORDER — QUETIAPINE FUMARATE 50 MG PO TABS
50.0000 mg | ORAL_TABLET | Freq: Two times a day (BID) | ORAL | Status: DC
  Administered 2017-11-03 – 2017-11-06 (×7): 50 mg via ORAL
  Filled 2017-11-03 (×7): qty 1

## 2017-11-03 NOTE — Progress Notes (Signed)
PT Cancellation Note  Patient Details Name: [name] MRN: [id number] DOB: Mar 04, 1945   Cancelled Treatment:    Reason Eval/Treat Not Completed: Medical issues which prohibited therapy Pt difficulty extubating today, back on full vent support pending MD eval, will cont to follow.    Reinaldo Berber, PT, DPT Acute Rehabilitation Services Pager: [phone] Office: [phone]     Reinaldo Berber 11/03/2017, 2:08 PM

## 2017-11-03 NOTE — Progress Notes (Signed)
Pharmacy Antibiotic Note  [name] is a 72 y.o. male admitted on 10/27/2017 with pneumonia.  Pharmacy has been consulted for Vancomycin dosing. Today is day 4 of vancomycin treatment. Patient is currently afebrile, WBC trended down from 13.6 to 12.4 today. Scr remains stable.   Vancomycin trough drawn this morning at steady state is therapeutic at 17.   Plan: Continue Vancomycin 1000 mg IV every 12 hours.  Goal trough 15-20 mcg/mL.  Will monitor renal function, cultures and sensitivities, VT if needed. Follow up anticipated LOT   Height: 5\' 5"  (165.1 cm) Weight: 188 lb 0.8 oz (85.3 kg) IBW/kg (Calculated) : 61.5  Temp (24hrs), Avg:98.8 F (37.1 C), Min:98.5 F (36.9 C), Max:99.3 F (37.4 C)  Recent Labs  Lab 10/29/17 0417 10/30/17 0324 10/31/17 0323 11/01/17 0445 11/02/17 0355 11/02/17 1741 11/03/17 0400 11/03/17 0500  WBC 6.4 10.1 10.1  --  13.6*  --   --  12.4*  CREATININE 1.14 0.86 0.77 0.78 0.78 0.75  --  0.80  VANCOTROUGH  --   --   --   --   --   --  17  --     Estimated Creatinine Clearance: 85.1 mL/min (by C-G formula based on SCr of 0.8 mg/dL).    Allergies  Allergen Reactions  . Nifedipine Swelling    11/02/2011 pt denies this allergy (??)  . Bee Venom Swelling    Antimicrobials this admission: Vancomycin 10/21 >>   Microbiology results: 10/20 Sputum:  Moderate MRSA  (S to Vanc) 10/17 MRSA PCR: POS  Thank you for allowing pharmacy to be a part of this patient's care.  Leron Croak, PharmD PGY1 Pharmacy Resident Phone: [phone]  Please check AMION for all [place] phone numbers 11/03/2017, 10:19 AM

## 2017-11-03 NOTE — Progress Notes (Signed)
RN called d/t pt w/ agitation and tachypnea.  RR 40's and increased WOB noted.  PT placed back on full vent support.

## 2017-11-03 NOTE — Progress Notes (Signed)
10 minutes prior- RR 35 but no increased WOB noted, just tachypnea.  Per MD extubate now.  Came to room for extubation, noted pt RR now in 45-48 w/ abdominal breathing/increased WOB.  RN x2  came to bedside and assessed pt and we discussed holding off extubation until MD can be reached.  RN called MD and pt was placed back on full vent support during this time- WOB/RR immediately decreased.  Per MD, hold off extubation until he can come and eval pt.  Pt not on Fentanyl currently.

## 2017-11-03 NOTE — Progress Notes (Signed)
CSW aware that pt was once being recommended for SNF placement per ED CSW note. CSW made aware that pt self extubated a few days ago and had to be re intubated. Pt remains on vent. CSW will continue to follow for further assessment of pt's need once medically stable.    Virgie Dad Ibrohim Simmers, MSW, [place] [facility] Clinical Social Worker [phone]

## 2017-11-03 NOTE — Progress Notes (Signed)
Nutrition Follow-up  DOCUMENTATION CODES:   Obesity unspecified  INTERVENTION:   Continue TF while intubated:  Vital High Protein at 45 ml/h with Pro-stat 30 ml BID via NGT to provide 1280 kcal, 125 gm protein, 903 ml free water daily  NUTRITION DIAGNOSIS:   Inadequate oral intake related to inability to eat as evidenced by NPO status.  Ongoing  GOAL:   Provide needs based on ASPEN/SCCM guidelines  Met with TF  MONITOR:   Vent status, TF tolerance, Labs, I & O's  ASSESSMENT:   72 yo male with PMH of HTN, HLD, BPH, GERD, PVD, hiatal hernia, CAD, and cirrhosis of the liver who was admitted on 10/17 for planned repair of left olecranon fracture (injured s/p fall at home on 10/10). Post-op patient was unable to be extubated.   Discussed patient with RN today. Tolerating TF well at goal rate to meet nutrition needs.  Self-extubated 10/22, but had to be re-intubated quickly. Hopeful for extubation soon.  Patient is currently intubated on ventilator support Temp (24hrs), Avg:98.8 F (37.1 C), Min:98.5 F (36.9 C), Max:99.3 F (37.4 C)   Labs reviewed. Sodium 134 (L), potassium 3 (L), magnesium 1.6 (L) Medications reviewed and include folic acid, lasix, mvi, thiamine.   Diet Order:   Diet Order    None      EDUCATION NEEDS:   No education needs have been identified at this time  Skin:  Skin Assessment: Skin Integrity Issues: Skin Integrity Issues:: Incisions Incisions: surgical incision to L arm  Last BM:  10/23 (type 7)  Height:   Ht Readings from Last 1 Encounters:  10/27/17 5' 5"  (1.651 m)    Weight:   Wt Readings from Last 1 Encounters:  11/03/17 85.3 kg    Ideal Body Weight:  61.8 kg  BMI:  Body mass index is 31.29 kg/m.  Estimated Nutritional Needs:   Kcal:  1000-1270  Protein:  124 gm  Fluid:  1.8-2 L    Molli Barrows, RD, LDN, CNSC Pager [phone] After Hours Pager [phone]

## 2017-11-03 NOTE — Progress Notes (Signed)
OT Cancellation Note  Patient Details Name: CHRISTOPHER GLASSCOCK MRN: [id number] DOB: [birth year]   Cancelled Treatment:    Reason Eval/Treat Not Completed: Medical issues which prohibited therapy(pt remains intubated and sedated, signing off.) Please reorder as appropriate.  Malka So 11/03/2017, 3:02 PM  Nestor Lewandowsky, OTR/L Acute Rehabilitation Services Pager: [phone] Office: [phone]

## 2017-11-03 NOTE — Progress Notes (Signed)
Interim progress:  Patient had been intended for extubation and orders with placed with vent orders d/c'd but patient had run of belly breathing w/ tachypnea to mid 40s and agitation.  Extubation delayed and orders for vent are being restored for documentation.  -Dr. Criss Rosales

## 2017-11-03 NOTE — Progress Notes (Signed)
NAME:  JUNIUS FAUCETT, MRN:  [id number], DOB:  [birth year], LOS: 7 ADMISSION DATE:  10/27/2017, CONSULTATION DATE:  10/27/17 REFERRING MD:  Erlinda Hong, CHIEF COMPLAINT:  Fall   Brief History   47 male presented to [facility] on 10/17 for a left olecranon fracture.  Post op was unable to be extubated.   Past Medical History  EtOH abuse, PAD, CAD, HTN, afib, dilated cardiomyopathy, GERD Significant Hospital Events   10/17 admit for repair of left olecranon fracture 10/18 unable to extubate, admitted to [facility] 10/22 self-extubated, failed spontaneous breathing, reintubated  Consults: date of consult/date signed off & final recs:  10/17 PCCM   Procedures (surgical and bedside):  10/17 ORIF  Significant Diagnostic Tests:  10/11 L elbow x-ray> broken  Micro Data:  resp culture > staph aureus > vanc sens  Antimicrobials:  Vanc 10/21 >   Cefazolin 10/17 >10/17  Subjective:  Remains mechanically ventilated, currently weaning sedation and on PRVC 18/5. Breakthrough agitation. Currently on Precedex 2  Objective   Blood pressure 138/61, pulse 70, temperature 98.9 F (37.2 C), temperature source Oral, resp. rate (!) 22, height 5\' 5"  (1.651 m), weight 85.3 kg, SpO2 100 %.    Vent Mode: PRVC FiO2 (%):  [30 %] 30 % Set Rate:  [18 bmp] 18 bmp Vt Set:  [560 mL] 560 mL PEEP:  [5 cmH20] 5 cmH20 Pressure Support:  [5 cmH20] 5 cmH20 Plateau Pressure:  [18 cmH20-28 cmH20] 18 cmH20   Intake/Output Summary (Last 24 hours) at 11/03/2017 0705 Last data filed at 11/03/2017 0500 Gross per 24 hour  Intake 2749.67 ml  Output 2610 ml  Net 139.67 ml   Filed Weights   11/01/17 0446 11/02/17 0400 11/03/17 0453  Weight: 90.1 kg 85.3 kg 85.3 kg    Examination:  General:  In bed sedated and intubated on PRVC, obese HENT: NCAT ETT in place, secure, OG tube ,NCAT, No LAD PULM: course throughout, decreased bases CV:s1/2, normal rhythm GI: soft, non tender belly MSK: normal bulk and tone, L arm in cast Neuro:  intermittently sedated on vent, MAE x 4  Resolved Hospital Problem list     Assessment & Plan:  Acute respiratory failure with hypoxemia, left hemidiaphragm Likely has bronchiectasis with acute exacerbation based on family history with MRSA>> vanc sensitive > PS trials > Continue  Vancomycin>> vanc sensitive on culture > VAP prevention > Maintain sats > 94% > Chest PT > 3% saline nebs if secretions become thicker > hold on CT chest for now.  Acute pulmonary edema >  Net negative of 300 cc's > CXR daily and prn  Acute metabolic encephalopathy due to EtOH withdrawal- has been getting precedex/fentanyl, no versed overnight 10/22 > precedex infusion, prn versed > Ativan for WD TID > start seroquel for agitation   Dilated cardiomyopathy > on furosemide 40 BID > hold metoprolol  Hyponatremia 134 10/24  ? Chronic 2/2 ETOH  > Minimize free water > Trend BMET  Hypokalemia 3.0 10/24 Replenish as needed  Afib, rate controlled > ASA 81mg  for stroke prevention  Left Olecranon Fracture s/p ORIF > Per Ortho > Capillary refill checks  Disposition / Summary of Today's Plan 11/03/17   As above, plan to wean sedation and PS trials with goal of liberation from mechanical ventilation    Diet: tube feeding  Pain/Anxiety/Delirium protocol (if indicated): PAD protocol VAP protocol (if indicated): yes DVT prophylaxis: lovenox GI prophylaxis: famotidine Hyperglycemia protocol: can start SSI if needed Mobility: bed rest Code Status: full  Family Communication: updated wife bedside  Labs   CBC: Recent Labs  Lab 10/29/17 0417 10/30/17 0324 10/31/17 0323 11/02/17 0355 11/03/17 0500  WBC 6.4 10.1 10.1 13.6* 12.4*  HGB 10.3* 10.3* 10.1* 9.3* 9.0*  HCT 31.7* 30.9* 30.9* 29.0* 28.1*  MCV 88.5 86.3 88.0 87.6 87.0  PLT 229 246 252 293 102    Basic Metabolic Panel: Recent Labs  Lab 10/29/17 0417 10/30/17 0324 10/31/17 0323 11/01/17 0445 11/01/17 0857 11/02/17 0355  11/02/17 1741  NA 137 134* 132* 132*  --  134* 131*  K 3.3* 3.1* 3.4* 3.0*  --  3.5 2.9*  CL 101 102 102 98  --  102 98  CO2 27 23 23 25   --  24 22  GLUCOSE 131* 137* 138* 147*  --  150* 254*  BUN 26* 23 23 18   --  23 24*  CREATININE 1.14 0.86 0.77 0.78  --  0.78 0.75  CALCIUM 7.9* 7.6* 7.9* 8.3*  --  8.3* 7.7*  MG 1.6* 1.3* 1.6*  --  1.4*  --   --   PHOS 2.3* 3.0 3.0  --   --   --   --    GFR: Estimated Creatinine Clearance: 85.1 mL/min (by C-G formula based on SCr of 0.75 mg/dL). Recent Labs  Lab 10/30/17 0324 10/31/17 0323 11/02/17 0355 11/03/17 0500  PROCALCITON  --   --  0.19  --   WBC 10.1 10.1 13.6* 12.4*    Liver Function Tests: Recent Labs  Lab 10/27/17 1722 11/02/17 0355  AST 50* 71*  ALT 25 52*  ALKPHOS 96 225*  BILITOT 1.4* 1.0  PROT 5.9* 5.8*  ALBUMIN 3.0* 2.2*   No results for input(s): LIPASE, AMYLASE in the last 168 hours. No results for input(s): AMMONIA in the last 168 hours.  ABG    Component Value Date/Time   PHART 7.449 12/26/2009 1920   PCO2ART 25.3 (L) 12/26/2009 1920   PO2ART 73.0 (L) 12/26/2009 1920   HCO3 17.6 (L) 12/26/2009 1920   TCO2 22 11/02/2011 1022   ACIDBASEDEF 5.0 (H) 12/26/2009 1920   O2SAT 96.0 12/26/2009 1920     Coagulation Profile: No results for input(s): INR, PROTIME in the last 168 hours.  Cardiac Enzymes: Recent Labs  Lab 10/27/17 1722 10/27/17 2259 10/28/17 0507  TROPONINI <0.03 <0.03 0.03*    HbA1C: Hgb A1c MFr Bld  Date/Time Value Ref Range Status  03/03/2012 12:00 PM 5.6 4.6 - 6.5 % Final    Comment:    Glycemic Control Guidelines for People with Diabetes:Non Diabetic:  <6%Goal of Therapy: <7%Additional Action Suggested:  >8%     CBG: Recent Labs  Lab 11/02/17 1136 11/02/17 1528 11/02/17 1943 11/02/17 2346 11/03/17 0342  GLUCAP 108* 107* 103* 110* 127*     Critical care time:     Sherene Sires, [place] 11/03/2017 7:05 AM

## 2017-11-04 ENCOUNTER — Inpatient Hospital Stay (HOSPITAL_COMMUNITY): Payer: Medicare Other

## 2017-11-04 LAB — POCT I-STAT 3, ART BLOOD GAS (G3+)
Bicarbonate: 25 mmol/L (ref 20.0–28.0)
O2 SAT: 99 %
PCO2 ART: 42.6 mmHg (ref 32.0–48.0)
TCO2: 26 mmol/L (ref 22–32)
pH, Arterial: 7.378 (ref 7.350–7.450)
pO2, Arterial: 157 mmHg — ABNORMAL HIGH (ref 83.0–108.0)

## 2017-11-04 LAB — BASIC METABOLIC PANEL
ANION GAP: 9 (ref 5–15)
BUN: 26 mg/dL — ABNORMAL HIGH (ref 8–23)
CHLORIDE: 103 mmol/L (ref 98–111)
CO2: 23 mmol/L (ref 22–32)
Calcium: 8.5 mg/dL — ABNORMAL LOW (ref 8.9–10.3)
Creatinine, Ser: 0.77 mg/dL (ref 0.61–1.24)
GFR calc Af Amer: >60 mL/min (ref >60)
GFR calc non Af Amer: >60 mL/min (ref >60)
Glucose, Bld: 132 mg/dL — ABNORMAL HIGH (ref 70–99)
POTASSIUM: 3.5 mmol/L (ref 3.5–5.1)
SODIUM: 135 mmol/L (ref 135–145)

## 2017-11-04 LAB — GLUCOSE, CAPILLARY
GLUCOSE-CAPILLARY: 104 mg/dL — AB (ref 70–99)
GLUCOSE-CAPILLARY: 113 mg/dL — AB (ref 70–99)
GLUCOSE-CAPILLARY: 115 mg/dL — AB (ref 70–99)
GLUCOSE-CAPILLARY: 123 mg/dL — AB (ref 70–99)
Glucose-Capillary: 89 mg/dL (ref 70–99)
Glucose-Capillary: 110 mg/dL — ABNORMAL HIGH (ref 70–99)
Glucose-Capillary: 108 mg/dL — ABNORMAL HIGH (ref 70–99)

## 2017-11-04 LAB — CBC
HEMATOCRIT: 27.6 % — AB (ref 39.0–52.0)
Hemoglobin: 9 g/dL — ABNORMAL LOW (ref 13.0–17.0)
MCH: 28.3 pg (ref 26.0–34.0)
MCHC: 32.6 g/dL (ref 30.0–36.0)
MCV: 86.8 fL (ref 80.0–100.0)
NRBC: 0 % (ref 0.0–0.2)
Platelets: 387 10*3/uL (ref 150–400)
RBC: 3.18 MIL/uL — AB (ref 4.22–5.81)
RDW: 15.9 % — AB (ref 11.5–15.5)
WBC: 12.3 10*3/uL — AB (ref 4.0–10.5)

## 2017-11-04 MED ORDER — GABAPENTIN 250 MG/5ML PO SOLN
200.0000 mg | Freq: Two times a day (BID) | ORAL | Status: DC
  Administered 2017-11-04 – 2017-11-06 (×3): 200 mg
  Filled 2017-11-04 (×4): qty 4

## 2017-11-04 MED ORDER — FENTANYL CITRATE (PF) 100 MCG/2ML IJ SOLN
12.5000 ug | INTRAMUSCULAR | Status: DC | PRN
  Administered 2017-11-04 (×2): 50 ug via INTRAVENOUS
  Administered 2017-11-05: 25 ug via INTRAVENOUS
  Administered 2017-11-05 – 2017-11-06 (×5): 50 ug via INTRAVENOUS
  Filled 2017-11-04 (×2): qty 2

## 2017-11-04 MED ORDER — CLONIDINE HCL 0.1 MG PO TABS
0.1000 mg | ORAL_TABLET | Freq: Four times a day (QID) | ORAL | Status: DC
  Administered 2017-11-04 – 2017-11-05 (×4): 0.1 mg
  Filled 2017-11-04 (×6): qty 1

## 2017-11-04 MED ORDER — POTASSIUM CHLORIDE 20 MEQ/15ML (10%) PO SOLN
40.0000 meq | Freq: Two times a day (BID) | ORAL | Status: DC
  Administered 2017-11-04 (×2): 40 meq
  Filled 2017-11-04 (×2): qty 30

## 2017-11-04 NOTE — Progress Notes (Addendum)
Pt initially appeared to be doing well s/p extubation.  Around 1150, pt rapidly becoming aggitatied/tachypnic and audible loud respirations= RR 40's, severe WOB and abdominal respirations noted.  Resident MD in unit & called to eval pt d/t rapid nature of decline.  While RN and resident were alerting attending MD, pt appeared to be in severe respiratory distress at one point saying "help".  I placed pt on bipap immediately and MD came to eval pt.  Once pt on bipap, pt immediately started to calm, respirations/WOB improving.  Pt continues to be on bipap presently.

## 2017-11-04 NOTE — Progress Notes (Signed)
PT Cancellation Note  Patient Details Name: [name] MRN: [id number] DOB: May 25, 1945   Cancelled Treatment:    Reason Eval/Treat Not Completed: Medical issues which prohibited therapy(Pt with incr WOB after being extubated.  Nurse asked to [place])   Denice Paradise 11/04/2017, 3:18 PM  Ayde Record,PT Acute Rehabilitation Services Pager:  [phone]  Office:  [phone]

## 2017-11-04 NOTE — Care Management Note (Signed)
Case Management Note  Patient Details  Name: [name] MRN: [id number] Date of Birth: 02-Sep-1945  Subjective/Objective:    Pt admitted post fracture repair - unable to be extubated  Action/Plan:  PTA from home with wife. SNF recommended - CSW following.  However, pt may move towards comfort care as he remains in resp distress post extubation.  CM will continue to follow    Expected Discharge Date:  10/27/17               Expected Discharge Plan:     In-House Referral:  Clinical Social Work  Discharge planning Services  CM Consult  Post Acute Care Choice:    Choice offered to:     DME Arranged:    DME Agency:     HH Arranged:    [place] Agency:     Status of Service:  In process, will continue to follow  If discussed at Long Length of Stay Meetings, dates discussed:    Additional Comments:  Maryclare Labrador, RN 11/04/2017, 4:41 PM

## 2017-11-04 NOTE — Progress Notes (Signed)
NAME:  [name], MRN:  [id number], DOB:  [birth year], LOS: 8 ADMISSION DATE:  10/27/2017, CONSULTATION DATE:  10/27/17 REFERRING MD:  Erlinda Hong, CHIEF COMPLAINT:  Fall   Brief History   30 male presented to [facility] on 10/17 for a left olecranon fracture.  Post op was unable to be extubated.   Past Medical History  EtOH abuse, PAD, CAD, HTN, afib, dilated cardiomyopathy, GERD Significant Hospital Events   10/17 admit for repair of left olecranon fracture 10/18 unable to extubate, admitted to [facility] 10/22 self-extubated, failed spontaneous breathing, reintubated 10/25 planned extubation delayed due to tachypnea ~40s  Consults: date of consult/date signed off & final recs:  10/17 PCCM   Procedures (surgical and bedside):  10/17 ORIF  Significant Diagnostic Tests:  10/11 L elbow x-ray> broken  Micro Data:  resp culture > staph aureus > vanc sens  Antimicrobials:  Vanc 10/21 >   Cefazolin 10/17 >10/17  Subjective:  Remains mechanically ventilated, currently weaning sedation and on PRVC 18/5 30%. Breakthrough agitation. Currently on Precedex/fentanyl  Objective   Blood pressure (!) 125/54, pulse 95, temperature 100.2 F (37.9 C), temperature source Oral, resp. rate (!) 21, height 5\' 5"  (1.651 m), weight 85.6 kg, SpO2 94 %.    Vent Mode: PRVC FiO2 (%):  [30 %] 30 % Set Rate:  [18 bmp] 18 bmp Vt Set:  [560 mL] 560 mL PEEP:  [5 cmH20] 5 cmH20 Pressure Support:  [10 cmH20] 10 cmH20 Plateau Pressure:  [20 PXT06-26 cmH20] 20 cmH20   Intake/Output Summary (Last 24 hours) at 11/04/2017 9485 Last data filed at 11/04/2017 0700 Gross per 24 hour  Intake 3138.09 ml  Output 3635 ml  Net -496.91 ml   Filed Weights   11/02/17 0400 11/03/17 0453 11/04/17 0500  Weight: 85.3 kg 85.3 kg 85.6 kg    Examination: General:  In bed sedated and intubated on PRVC, obese RASS -3/4 HENT: ETT in place, secure, OG tube ,NCAT, No LAD PULM: course throughout, decreased bases CV s1/2, rate wnl GI:  soft, non tender belly MSK: normal bulk and tone, L arm in cast Neuro: sedated on vent, MAE x 4  Resolved Hospital Problem list     Assessment & Plan:  Acute respiratory failure with hypoxemia, left hemidiaphragm Likely has bronchiectasis with acute exacerbation based on family history with MRSA>> vanc sensitive > PS trials > Continue  Vancomycin>> vanc sensitive on culture > VAP prevention > Maintain sats > 94% > Chest PT > 3% saline nebs if secretions prn  Acute pulmonary edema >  Net negative of 1.2L > CXR daily and prn  Acute metabolic encephalopathy due to EtOH withdrawal- has been getting precedex/fentanyl > precedex infusion, prn versed > Ativan for WD TID > on seroquel 50 BID agitation   Dilated cardiomyopathy > on furosemide 40 BID w/ good urine output > hold metoprolol  Hyponatremia resolved 135 10/25  ? Chronic 2/2 ETOH  > Minimize free water > Trend BMET  Hypokalemia 3.5 10/25, now on K 40bid -Replenish above scheduled as needed  Afib, rate controlled > ASA 81mg  for stroke prevention  Left Olecranon Fracture s/p ORIF > Per Ortho > Capillary refill checks  Disposition / Summary of Today's Plan 11/04/17   As above, hope nto wean sedation and PS trials with goal of liberation from mechanical ventilation.  Wife has stated he would not like long term ventilation    Diet: tube feeding  Pain/Anxiety/Delirium protocol (if indicated): PAD protocol VAP protocol (if indicated): yes  DVT prophylaxis: lovenox GI prophylaxis: famotidine Hyperglycemia protocol: can start SSI if needed Mobility: bed rest Code Status: full Family Communication: updated wife bedside  Labs   CBC: Recent Labs  Lab 10/30/17 0324 10/31/17 0323 11/02/17 0355 11/03/17 0500 11/04/17 0412  WBC 10.1 10.1 13.6* 12.4* 12.3*  HGB 10.3* 10.1* 9.3* 9.0* 9.0*  HCT 30.9* 30.9* 29.0* 28.1* 27.6*  MCV 86.3 88.0 87.6 87.0 86.8  PLT 246 252 293 369 536    Basic Metabolic Panel: Recent  Labs  Lab 10/29/17 0417 10/30/17 0324 10/31/17 0323  11/01/17 0857 11/02/17 0355 11/02/17 1741 11/03/17 0500 11/03/17 1700 11/04/17 0412  NA 137 134* 132*   < >  --  134* 131* 134* 134* 135  K 3.3* 3.1* 3.4*   < >  --  3.5 2.9* 3.0* 3.4* 3.5  CL 101 102 102   < >  --  102 98 101 98 103  CO2 27 23 23    < >  --  24 22 23 23 23   GLUCOSE 131* 137* 138*   < >  --  150* 254* 137* 135* 132*  BUN 26* 23 23   < >  --  23 24* 27* 25* 26*  CREATININE 1.14 0.86 0.77   < >  --  0.78 0.75 0.80 0.78 0.77  CALCIUM 7.9* 7.6* 7.9*   < >  --  8.3* 7.7* 8.4* 8.8* 8.5*  MG 1.6* 1.3* 1.6*  --  1.4*  --   --  1.6*  --   --   PHOS 2.3* 3.0 3.0  --   --   --   --  4.0  --   --    < > = values in this interval not displayed.   GFR: Estimated Creatinine Clearance: 85.2 mL/min (by C-G formula based on SCr of 0.77 mg/dL). Recent Labs  Lab 10/31/17 0323 11/02/17 0355 11/03/17 0500 11/04/17 0412  PROCALCITON  --  0.19 0.21  --   WBC 10.1 13.6* 12.4* 12.3*    Liver Function Tests: Recent Labs  Lab 11/02/17 0355  AST 71*  ALT 52*  ALKPHOS 225*  BILITOT 1.0  PROT 5.8*  ALBUMIN 2.2*   No results for input(s): LIPASE, AMYLASE in the last 168 hours. No results for input(s): AMMONIA in the last 168 hours.  ABG    Component Value Date/Time   PHART 7.449 12/26/2009 1920   PCO2ART 25.3 (L) 12/26/2009 1920   PO2ART 73.0 (L) 12/26/2009 1920   HCO3 17.6 (L) 12/26/2009 1920   TCO2 22 11/02/2011 1022   ACIDBASEDEF 5.0 (H) 12/26/2009 1920   O2SAT 96.0 12/26/2009 1920     Coagulation Profile: No results for input(s): INR, PROTIME in the last 168 hours.  Cardiac Enzymes: No results for input(s): CKTOTAL, CKMB, CKMBINDEX, TROPONINI in the last 168 hours.  HbA1C: Hgb A1c MFr Bld  Date/Time Value Ref Range Status  03/03/2012 12:00 PM 5.6 4.6 - 6.5 % Final    Comment:    Glycemic Control Guidelines for People with Diabetes:Non Diabetic:  <6%Goal of Therapy: <7%Additional Action Suggested:  >8%      CBG: Recent Labs  Lab 11/03/17 1128 11/03/17 1510 11/03/17 1947 11/04/17 0011 11/04/17 0422  GLUCAP 108* 114* 104* 115* 89     Critical care time:     Sherene Sires, [place] 11/04/2017 7:23 AM

## 2017-11-04 NOTE — Procedures (Signed)
Extubation Procedure Note  Patient Details:   Name: [name] DOB: Oct 09, 1945 MRN: [id number]   Airway Documentation:  + cuff leak test prior to extubation.   Vent end date: 11/04/17 Vent end time: 1130   Evaluation  O2 sats: stable throughout Complications: No apparent complications Patient did tolerate procedure well. Bilateral Breath Sounds: Diminished   Yes, pt able to speak. No stridor noted, no distress currently noted.  Lenna Sciara 11/04/2017, 11:34 AM

## 2017-11-05 ENCOUNTER — Inpatient Hospital Stay (HOSPITAL_COMMUNITY): Payer: Medicare Other

## 2017-11-05 DIAGNOSIS — Z978 Presence of other specified devices: Secondary | ICD-10-CM

## 2017-11-05 LAB — GLUCOSE, CAPILLARY
GLUCOSE-CAPILLARY: 96 mg/dL (ref 70–99)
Glucose-Capillary: 71 mg/dL (ref 70–99)
Glucose-Capillary: 78 mg/dL (ref 70–99)
Glucose-Capillary: 92 mg/dL (ref 70–99)
Glucose-Capillary: 97 mg/dL (ref 70–99)
Glucose-Capillary: 98 mg/dL (ref 70–99)

## 2017-11-05 LAB — BASIC METABOLIC PANEL
ANION GAP: 8 (ref 5–15)
BUN: 22 mg/dL (ref 8–23)
CALCIUM: 9 mg/dL (ref 8.9–10.3)
CHLORIDE: 103 mmol/L (ref 98–111)
CO2: 26 mmol/L (ref 22–32)
CREATININE: 0.82 mg/dL (ref 0.61–1.24)
GFR calc non Af Amer: >60 mL/min (ref >60)
Glucose, Bld: 110 mg/dL — ABNORMAL HIGH (ref 70–99)
Potassium: 4.5 mmol/L (ref 3.5–5.1)
SODIUM: 137 mmol/L (ref 135–145)

## 2017-11-05 LAB — CBC
HEMATOCRIT: 28.9 % — AB (ref 39.0–52.0)
HEMOGLOBIN: 9.1 g/dL — AB (ref 13.0–17.0)
MCH: 28.2 pg (ref 26.0–34.0)
MCHC: 31.5 g/dL (ref 30.0–36.0)
MCV: 89.5 fL (ref 80.0–100.0)
NRBC: 0 % (ref 0.0–0.2)
Platelets: 418 10*3/uL — ABNORMAL HIGH (ref 150–400)
RBC: 3.23 MIL/uL — ABNORMAL LOW (ref 4.22–5.81)
RDW: 15.7 % — AB (ref 11.5–15.5)
WBC: 12.3 10*3/uL — AB (ref 4.0–10.5)

## 2017-11-05 MED ORDER — CLONIDINE HCL 0.2 MG PO TABS
0.2000 mg | ORAL_TABLET | Freq: Four times a day (QID) | ORAL | Status: DC
  Administered 2017-11-05 – 2017-11-06 (×3): 0.2 mg
  Filled 2017-11-05 (×4): qty 1

## 2017-11-05 MED ORDER — POTASSIUM CHLORIDE 20 MEQ/15ML (10%) PO SOLN
20.0000 meq | Freq: Two times a day (BID) | ORAL | Status: DC
  Administered 2017-11-05 – 2017-11-06 (×2): 20 meq
  Filled 2017-11-05 (×2): qty 15

## 2017-11-05 MED ORDER — METOPROLOL TARTRATE 5 MG/5ML IV SOLN
5.0000 mg | Freq: Four times a day (QID) | INTRAVENOUS | Status: DC
  Administered 2017-11-05 – 2017-11-07 (×7): 5 mg via INTRAVENOUS
  Filled 2017-11-05 (×7): qty 5

## 2017-11-05 NOTE — Progress Notes (Signed)
RT note: attempted to take patient off of bipap.  Once bipap mask was removed patient's work of breathing began to increase and patient looked as though was working harder to breath.  Placed patient back on bipap.  RN aware.  Will continue to monitor.

## 2017-11-05 NOTE — Progress Notes (Signed)
NAME:  [name], MRN:  [id number], DOB:  [birth year], LOS: 9 ADMISSION DATE:  10/27/2017, CONSULTATION DATE:  10/27/17 REFERRING MD:  Erlinda Hong, CHIEF COMPLAINT:  Fall   Brief History   48 male presented to [facility] on 10/17 for a left olecranon fracture.  Post op was unable to be extubated by surgery.  Transferred to CCM.  Extubated 10/25 and returned to DNR status (was prior to surgery).  Past Medical History  EtOH abuse, PAD, CAD, HTN, afib, dilated cardiomyopathy, GERD Significant Hospital Events   10/17 admit for repair of left olecranon fracture 10/18 unable to extubate, admitted to [facility] 10/22 self-extubated, failed spontaneous breathing, reintubated 10/25 extubated, per wife's explanation of prior wishes made DNR  Consults: date of consult/date signed off & final recs:  10/17 PCCM   Procedures (surgical and bedside):  10/17 ORIF  Significant Diagnostic Tests:  10/11 L elbow x-ray> broken  Micro Data:  resp culture > staph aureus > vanc sens  Antimicrobials:  Vanc 10/21 >   Cefazolin 10/17 >10/17  Subjective:  Remains mechanically ventilated, currently weaning sedation and on PRVC 18/5 30%. Breakthrough agitation. Currently on Precedex/fentanyl.  Ativan prn.  Seroquel/clonidine  Per RN overnight would not open eyes, but would intermittently follow commands and respond to family voices  10/25 CXR largely unchanged from 10/24 Objective   Blood pressure (!) 112/53, pulse 71, temperature 98 F (36.7 C), temperature source Axillary, resp. rate (!) 25, height 5\' 5"  (1.651 m), weight 85.6 kg, SpO2 (!) 87 %.    Vent Mode: BIPAP;PSV FiO2 (%):  [30 %-40 %] 40 % PEEP:  [5 cmH20] 5 cmH20 Pressure Support:  [5 cmH20-15 cmH20] 15 cmH20 Plateau Pressure:  [12 cmH20] 12 cmH20   Intake/Output Summary (Last 24 hours) at 11/05/2017 0644 Last data filed at 11/05/2017 0600 Gross per 24 hour  Intake 1691.66 ml  Output 3650 ml  Net -1958.34 ml   Filed Weights   11/02/17 0400 11/03/17  0453 11/04/17 0500  Weight: 85.3 kg 85.3 kg 85.6 kg    Examination: General: still sedated on Bipap/psv, obese, RASS -3/4 HENT: bipap, OG tube ,NCAT, No LAD PULM: course throughout, decreased bases CV s1/2, rate wnl GI: soft, non tender belly MSK: normal bulk and tone, L arm in cast Neuro: sedated on vent, MAE x 4  Resolved Hospital Problem list   hyponatremia  Assessment & Plan:  Acute respiratory failure with hypoxemia, left hemidiaphragm.  ON bipap Likely has bronchiectasis with acute exacerbation based on family history with MRSA>> vanc sensitive > Continue  Vancomycin>> vanc sensitive on culture > VAP prevention > Maintain sats > 94% > Chest PT > 3% saline nebs if secretions prn  Acute pulmonary edema >  Net negative of 2.7L > CXR daily and prn  Acute metabolic encephalopathy due to EtOH withdrawal- has been getting precedex/fentanyl > precedex infusion, prn versed > Ativan for WD TID > on seroquel 50 BID agitation   Anemia: 9.1 10/26, stable -cont to monitor  Dilated cardiomyopathy > on furosemide 40 BID w/ good urine output > hold metoprolol  Hypokalemia resolved, now 4.5 10/26, was on K 40bid -Will decrease scheduled potassium to 20 BID -Replenish above scheduled as needed  Afib, rate controlled > ASA 81mg  for stroke prevention  Left Olecranon Fracture s/p ORIF > Per Ortho > Capillary refill checks  Disposition / Summary of Today's Plan 11/05/17   Will stay on BIPAP as needed.  DNR per discussion with wife.  Fentanyl seemed to control  agitation well overnight    Diet: tube feeding  Pain/Anxiety/Delirium protocol (if indicated): PAD protocol VAP protocol (if indicated): yes DVT prophylaxis: lovenox GI prophylaxis: famotidine Hyperglycemia protocol: can start SSI if needed Mobility: bed rest Code Status: DNR Family Communication: updated wife bedside  Labs   CBC: Recent Labs  Lab 10/31/17 0323 11/02/17 0355 11/03/17 0500 11/04/17 0412  11/05/17 0424  WBC 10.1 13.6* 12.4* 12.3* 12.3*  HGB 10.1* 9.3* 9.0* 9.0* 9.1*  HCT 30.9* 29.0* 28.1* 27.6* 28.9*  MCV 88.0 87.6 87.0 86.8 89.5  PLT 252 293 369 387 418*    Basic Metabolic Panel: Recent Labs  Lab 10/30/17 0324 10/31/17 0323  11/01/17 0857  11/02/17 1741 11/03/17 0500 11/03/17 1700 11/04/17 0412 11/05/17 0424  NA 134* 132*   < >  --    < > 131* 134* 134* 135 137  K 3.1* 3.4*   < >  --    < > 2.9* 3.0* 3.4* 3.5 4.5  CL 102 102   < >  --    < > 98 101 98 103 103  CO2 23 23   < >  --    < > 22 23 23 23 26   GLUCOSE 137* 138*   < >  --    < > 254* 137* 135* 132* 110*  BUN 23 23   < >  --    < > 24* 27* 25* 26* 22  CREATININE 0.86 0.77   < >  --    < > 0.75 0.80 0.78 0.77 0.82  CALCIUM 7.6* 7.9*   < >  --    < > 7.7* 8.4* 8.8* 8.5* 9.0  MG 1.3* 1.6*  --  1.4*  --   --  1.6*  --   --   --   PHOS 3.0 3.0  --   --   --   --  4.0  --   --   --    < > = values in this interval not displayed.   GFR: Estimated Creatinine Clearance: 83.1 mL/min (by C-G formula based on SCr of 0.82 mg/dL). Recent Labs  Lab 11/02/17 0355 11/03/17 0500 11/04/17 0412 11/05/17 0424  PROCALCITON 0.19 0.21  --   --   WBC 13.6* 12.4* 12.3* 12.3*    Liver Function Tests: Recent Labs  Lab 11/02/17 0355  AST 71*  ALT 52*  ALKPHOS 225*  BILITOT 1.0  PROT 5.8*  ALBUMIN 2.2*   No results for input(s): LIPASE, AMYLASE in the last 168 hours. No results for input(s): AMMONIA in the last 168 hours.  ABG    Component Value Date/Time   PHART 7.378 11/04/2017 1533   PCO2ART 42.6 11/04/2017 1533   PO2ART 157.0 (H) 11/04/2017 1533   HCO3 25.0 11/04/2017 1533   TCO2 26 11/04/2017 1533   ACIDBASEDEF 5.0 (H) 12/26/2009 1920   O2SAT 99.0 11/04/2017 1533     Coagulation Profile: No results for input(s): INR, PROTIME in the last 168 hours.  Cardiac Enzymes: No results for input(s): CKTOTAL, CKMB, CKMBINDEX, TROPONINI in the last 168 hours.  HbA1C: Hgb A1c MFr Bld  Date/Time Value  Ref Range Status  03/03/2012 12:00 PM 5.6 4.6 - 6.5 % Final    Comment:    Glycemic Control Guidelines for People with Diabetes:Non Diabetic:  <6%Goal of Therapy: <7%Additional Action Suggested:  >8%     CBG: Recent Labs  Lab 11/04/17 1224 11/04/17 1556 11/04/17 2040 11/05/17 0027 11/05/17 0345  GLUCAP  113* 123* 108* 92 78     Critical care time:     Sherene Sires, [place] 11/05/2017 6:44 AM

## 2017-11-05 NOTE — Progress Notes (Signed)
RT note: patient taken off of bipap and placed on 6L nasal cannula.  Currently tolerating well.  Will continue to monitor.  

## 2017-11-05 NOTE — Progress Notes (Signed)
PT Cancellation Note  Patient Details Name: [name] MRN: [id number] DOB: Jun 18, 1945   Cancelled Treatment:    Reason Eval/Treat Not Completed: Medical issues which prohibited therapy(Nurse asked PT to HOLD until Monday.  Will return Monday. )   [place] 11/05/2017, 10:55 AM Amanda Cockayne Acute Rehabilitation Services Pager:  [phone]  Office:  [phone]

## 2017-11-05 NOTE — Progress Notes (Signed)
Patient was attempting to slide out of bed on arrival to perform 1600 CPT.  Will hold on 1600 CPT.  Still tolerating nasal cannula at this time.  Will continue to monitor.

## 2017-11-06 LAB — CBC
HCT: 30 % — ABNORMAL LOW (ref 39.0–52.0)
Hemoglobin: 9.5 g/dL — ABNORMAL LOW (ref 13.0–17.0)
MCH: 28.1 pg (ref 26.0–34.0)
MCHC: 31.7 g/dL (ref 30.0–36.0)
MCV: 88.8 fL (ref 80.0–100.0)
PLATELETS: 602 10*3/uL — AB (ref 150–400)
RBC: 3.38 MIL/uL — ABNORMAL LOW (ref 4.22–5.81)
RDW: 15.6 % — ABNORMAL HIGH (ref 11.5–15.5)
WBC: 13.4 10*3/uL — AB (ref 4.0–10.5)
nRBC: 0 % (ref 0.0–0.2)

## 2017-11-06 LAB — GLUCOSE, CAPILLARY
GLUCOSE-CAPILLARY: 94 mg/dL (ref 70–99)
GLUCOSE-CAPILLARY: 99 mg/dL (ref 70–99)
Glucose-Capillary: 114 mg/dL — ABNORMAL HIGH (ref 70–99)
Glucose-Capillary: 96 mg/dL (ref 70–99)
Glucose-Capillary: 115 mg/dL — ABNORMAL HIGH (ref 70–99)
Glucose-Capillary: 103 mg/dL — ABNORMAL HIGH (ref 70–99)

## 2017-11-06 LAB — BASIC METABOLIC PANEL
Anion gap: 11 (ref 5–15)
BUN: 22 mg/dL (ref 8–23)
CALCIUM: 8.8 mg/dL — AB (ref 8.9–10.3)
CO2: 25 mmol/L (ref 22–32)
CREATININE: 0.98 mg/dL (ref 0.61–1.24)
Chloride: 101 mmol/L (ref 98–111)
GFR calc non Af Amer: >60 mL/min (ref >60)
Glucose, Bld: 125 mg/dL — ABNORMAL HIGH (ref 70–99)
Potassium: 3.2 mmol/L — ABNORMAL LOW (ref 3.5–5.1)
SODIUM: 137 mmol/L (ref 135–145)

## 2017-11-06 MED ORDER — FOLIC ACID 1 MG PO TABS
1.0000 mg | ORAL_TABLET | Freq: Every day | ORAL | Status: DC
  Administered 2017-11-07 – 2017-11-09 (×3): 1 mg via ORAL
  Filled 2017-11-06 (×3): qty 1

## 2017-11-06 MED ORDER — SODIUM CHLORIDE 3 % IN NEBU
4.0000 mL | INHALATION_SOLUTION | RESPIRATORY_TRACT | Status: DC | PRN
  Filled 2017-11-06: qty 120

## 2017-11-06 MED ORDER — FAMOTIDINE 20 MG PO TABS
20.0000 mg | ORAL_TABLET | Freq: Two times a day (BID) | ORAL | Status: DC
Start: 2017-11-06 — End: 2017-11-08
  Administered 2017-11-06 – 2017-11-08 (×4): 20 mg via ORAL
  Filled 2017-11-06 (×4): qty 1

## 2017-11-06 MED ORDER — POTASSIUM CHLORIDE CRYS ER 20 MEQ PO TBCR: 30.0000 meq | EXTENDED_RELEASE_TABLET | Freq: Two times a day (BID) | ORAL | Status: DC

## 2017-11-06 MED ORDER — VITAMIN B-1 100 MG PO TABS
100.0000 mg | ORAL_TABLET | Freq: Every day | ORAL | Status: DC
  Administered 2017-11-07 – 2017-11-09 (×3): 100 mg via ORAL
  Filled 2017-11-06 (×3): qty 1

## 2017-11-06 MED ORDER — QUETIAPINE FUMARATE 100 MG PO TABS
100.0000 mg | ORAL_TABLET | Freq: Two times a day (BID) | ORAL | Status: DC
  Administered 2017-11-06 – 2017-11-09 (×6): 100 mg via ORAL
  Filled 2017-11-06 (×7): qty 1

## 2017-11-06 MED ORDER — HALOPERIDOL LACTATE 5 MG/ML IJ SOLN
INTRAMUSCULAR | Status: AC
  Filled 2017-11-06: qty 1

## 2017-11-06 MED ORDER — HALOPERIDOL LACTATE 5 MG/ML IJ SOLN
5.0000 mg | INTRAMUSCULAR | Status: DC | PRN
  Administered 2017-11-06 – 2017-11-07 (×2): 5 mg via INTRAMUSCULAR
  Filled 2017-11-06 (×2): qty 1

## 2017-11-06 MED ORDER — POTASSIUM CHLORIDE 20 MEQ/15ML (10%) PO SOLN
30.0000 meq | Freq: Two times a day (BID) | ORAL | Status: DC
  Administered 2017-11-07: 30 meq via ORAL
  Filled 2017-11-06 (×2): qty 30

## 2017-11-06 MED ORDER — GABAPENTIN 100 MG PO CAPS
200.0000 mg | ORAL_CAPSULE | Freq: Two times a day (BID) | ORAL | Status: DC
  Administered 2017-11-06 – 2017-11-07 (×2): 200 mg via ORAL
  Filled 2017-11-06 (×2): qty 2

## 2017-11-06 MED ORDER — HALOPERIDOL LACTATE 5 MG/ML IJ SOLN
5.0000 mg | Freq: Once | INTRAMUSCULAR | Status: AC
  Administered 2017-11-06: 5 mg via INTRAMUSCULAR

## 2017-11-06 MED ORDER — CLONIDINE HCL 0.2 MG PO TABS
0.2000 mg | ORAL_TABLET | Freq: Four times a day (QID) | ORAL | Status: DC
  Administered 2017-11-06 – 2017-11-09 (×11): 0.2 mg via ORAL
  Filled 2017-11-06 (×14): qty 1

## 2017-11-06 NOTE — Evaluation (Signed)
Clinical/Bedside Swallow Evaluation Patient Details  Name: [name] MRN: [id number] Date of Birth: 03/14/1945  Today's Date: 11/06/2017 Time: SLP Start Time (ACUTE ONLY): 1205 SLP Stop Time (ACUTE ONLY): 1225 SLP Time Calculation (min) (ACUTE ONLY): 20 min  Past Medical History:  Past Medical History:  Diagnosis Date  . Adenomatous polyp of colon 2006  . Alcohol-induced persisting dementia ([place])   . Anemia 2012  . Anxiety   . Arthritis    "left arm/shoulder" (05/17/2016)  . Atrial tachycardia, paroxysmal ([place]) 04/07/2015  . BPH (benign prostatic hypertrophy) 5/99  . Chronic bronchitis ([place])    "q year or q other year" (11/02/2011)  . Cirrhosis of liver ([place])   . DCM (dilated cardiomyopathy) ([place])    EF 45-50% by echo and cath 2017  . Depression    Hx of  . Falls frequently    "daily lately; legs just give out" (11/02/2011; 05/17/2016)  . GERD (gastroesophageal reflux disease) 07/31/04   gastritis   . H/O hiatal hernia   . Hard of hearing   . Heart murmur   . Hernia, umbilical    "unrepaired" (11/02/2011; 05/17/2016)  . History of blood transfusion 2012  . HLD (hyperlipidemia) 5/99  . HTN (hypertension) 5/99  . Mild CAD    10% RCA  . PAC (premature atrial contraction) 04/07/2015  . Peripheral vascular disease ([place])   . Pneumonia 1996   hosp  . PONV (postoperative nausea and vomiting)   . Seasonal allergies    pollen / ragweed  . Wears glasses    Past Surgical History:  Past Surgical History:  Procedure Laterality Date  . BACK SURGERY    . CARDIAC CATHETERIZATION N/A 05/23/2015   Procedure: Left Heart Cath and Coronary Angiography;  Surgeon: Troy Sine, MD;  Location: [place] [facility];  Service: Cardiovascular;  Laterality: N/A;  . CATARACT EXTRACTION W/ INTRAOCULAR LENS  IMPLANT, BILATERAL Bilateral 10/2002  . COLONOSCOPY  07/31/04   multiple polyps; repeat in 3 years  . DUPUYTREN CONTRACTURE RELEASE Left   . ETT myoview  03/09/07   nml EF 66%  . FRACTURE  SURGERY    . HEMORRHOID SURGERY     "cauterized a long time ago" (11/02/2011)  . hosp CP R/O'D 2/24  03/08/07  . neck MRI  6/04   C5-6 disc abnormality  . ORIF ELBOW FRACTURE Left 10/27/2017   Procedure: OPEN REDUCTION INTERNAL FIXATION (ORIF) LEFT OLECRANON;  Surgeon: Leandrew Koyanagi, MD;  Location: [place];  Service: Orthopedics;  Laterality: Left;  . ORIF HUMERUS FRACTURE  11/04/2011   Procedure: OPEN REDUCTION INTERNAL FIXATION (ORIF) PROXIMAL HUMERUS FRACTURE;  Surgeon: Rozanna Box, MD;  Location: [place];  Service: Orthopedics;  Laterality: Left;  . POSTERIOR CERVICAL FUSION/FORAMINOTOMY N/A 03/21/2012   Procedure: POSTERIOR CERVICAL FUSION/FORAMINOTOMY LEVEL ONE;  Surgeon: Charlie Pitter, MD;  Location: [place] [facility]UR[facility] OR[facility];  Service: Neurosurgery;  Laterality: N/A;  POSTERIOR CERVICAL ONE TO CERVICAL TWO FUSION WITH ILIAC CREST GRAFT AND LATERAL MASS SCREWS   . TONSILLECTOMY     "I was young" (11/02/2011)  . TOTAL HIP ARTHROPLASTY Left 10/24/2016   Procedure: TOTAL HIP ARTHROPLASTY ANTERIOR APPROACH;  Surgeon: Rod Can, MD;  Location: [place];  Service: Orthopedics;  Laterality: Left;  . UPPER GASTROINTESTINAL ENDOSCOPY     HPI:  Patient was admited for repair of left olecranon fracture. After surgery patient was unsuccessfully extubated. Patient extubated 11/04/17 and required Bipap all day 10/26. Patient is now requiring no O2.  Assessment / Plan / Recommendation Clinical Impression  Patient is now requiring no O2. His voice quality is strong and clear. He was pulling at lines and tubes when entering room. restrained by Posey belt, but still trying to get up from bed. He continues to be confused and somewhat agitated. After repositioning in the bed he did agree to take a few ice chips and sips of water. He was not able to follow commands for an oral motor exam, but observed oral motor was [facility]. Pt had no cough or clearing with ice chips, but had an immediate cough with sip from cup all three  trials. Suspect delayed swallow as well as poor bolus control complicated by impulsivity, movement and talking.  Patient refused all other trials of applesauce and cracker. Spoke with nurse about findings. Recommend continued NPO status and repeat evaluation at next available date.  SLP Visit Diagnosis: Dysphagia, unspecified (R13.10)    Aspiration Risk  Severe aspiration risk    Diet Recommendation NPO        Other  Recommendations Oral Care Recommendations: Oral care BID   Follow up Recommendations   TBD     Frequency and Duration   TBD         Prognosis Prognosis for Safe Diet Advancement: Fair Barriers to Reach Goals: Cognitive deficits      Swallow Study   General Date of Onset: 10/27/17 HPI: Patient was admited for repair of left olecranon fracture. After surgery patient was unsuccessfully extubated. Patient extubated 11/04/17 and required Bipap all day 10/26. Patient is now requiring no O2. Type of Study: Bedside Swallow Evaluation Previous Swallow Assessment: no Diet Prior to this Study: NPO Temperature Spikes Noted: No Respiratory Status: Room air History of Recent Intubation: Yes Length of Intubations (days): 7 days Date extubated: 11/04/17 Behavior/Cognition: Alert;Confused;Agitated;Impulsive;Uncooperative;Doesn't follow directions Oral Cavity Assessment: Within Functional Limits Oral Care Completed by SLP: No Oral Cavity - Dentition: Adequate natural dentition Vision: Functional for self-feeding Self-Feeding Abilities: Total assist;Refused PO Patient Positioning: Upright in bed Baseline Vocal Quality: Normal Volitional Cough: Cognitively unable to elicit Volitional Swallow: Unable to elicit    Oral/Motor/Sensory Function Overall Oral Motor/Sensory Function: Within functional limits   Ice Chips Ice chips: Within functional limits Presentation: Spoon   Thin Liquid Thin Liquid: Impaired Presentation: Cup Oral Phase Impairments: Reduced lingual  movement/coordination;Poor awareness of bolus Oral Phase Functional Implications: Prolonged oral transit Pharyngeal  Phase Impairments: Cough - Immediate    Nectar Thick Nectar Thick Liquid: Not tested   Honey Thick Honey Thick Liquid: Not tested   Puree Puree: Not tested   Solid     Solid: Not tested      Charlynne Cousins Gerhard Rappaport, MA, CCC-SLP 11/06/2017 1:04 PM

## 2017-11-06 NOTE — Progress Notes (Signed)
NAME:  [name], MRN:  [id number], DOB:  1945-12-10, LOS: 76 ADMISSION DATE:  10/27/2017, CONSULTATION DATE:  10/27/17 REFERRING MD:  Erlinda Hong, CHIEF COMPLAINT:  Fall   Brief History   53 male presented to [facility] on 10/17 for a left olecranon fracture.  Post op was unable to be extubated by surgery.  Transferred to CCM.  Extubated 10/25 and returned to DNR status (was prior to surgery).  Past Medical History  EtOH abuse, PAD, CAD, HTN, afib, dilated cardiomyopathy, GERD Significant Hospital Events   10/17 admit for repair of left olecranon fracture 10/18 unable to extubate, admitted to [facility] 10/22 self-extubated, failed spontaneous breathing, reintubated 10/25 extubated, per wife's explanation of prior wishes made DNR 10/27 off cpap, alert and talking on [place]  Consults: date of consult/date signed off & final recs:  10/17 PCCM   Procedures (surgical and bedside):  10/17 ORIF  Significant Diagnostic Tests:  10/11 L elbow x-ray> broken  Micro Data:  resp culture > staph aureus > vanc sens  Antimicrobials:  Vanc 10/21 >   Cefazolin 10/17 >10/17  Subjective:  Now talking on [place] Asking to see family, needs rediretion to keep from getting out of bed.  Did well with sitter but needs posey belt otherwise  Objective   Blood pressure (!) 149/52, pulse 97, temperature (!) 97.2 F (36.2 C), temperature source Oral, resp. rate (!) 32, height 5\' 5"  (1.651 m), weight 85.6 kg, SpO2 99 %.        Intake/Output Summary (Last 24 hours) at 11/06/2017 0943 Last data filed at 11/06/2017 0800 Gross per 24 hour  Intake 335.19 ml  Output 2505 ml  Net -2169.81 ml   Filed Weights   11/02/17 0400 11/03/17 0453 11/04/17 0500  Weight: 85.3 kg 85.3 kg 85.6 kg    Examination: General: alert but disoriented on 2L [place], obese HENT: [place], No LAD PULM: still course throughout, decreased bases CV s1/2, rate ~120 GI: soft, non tender belly MSK: normal bulk and tone, L arm in cast Neuro: moving all limbs  at will, disoriented and needs redirection to stay in bed  Resolved Hospital Problem list   hyponatremia  Assessment & Plan:  Acute respiratory failure with hypoxemia, left hemidiaphragm.  [place] 2L Likely has bronchiectasis with acute exacerbation based on family history with MRSA>> vanc sensitive > Continue  Vancomycin>> vanc sensitive on culture (day 6 10/27) > Maintain sats > 94% > Chest PT > 3% saline nebs if secretions prn  Acute pulmonary edema >  Net negative of 7.0J  Acute metabolic encephalopathy due to EtOH withdrawal- has been getting precedex/fentanyl > on seroquel 50 BID agitation  > prn ativan  Anemia: 9.5 10/27, stable -cont to monitor  Dilated cardiomyopathy > on furosemide 40 BID w/ good urine output > have been holding metoprolol, started lopressor 5mg  q6 10/26  Hypokalemia 3.2 10.27, missed pm dose of BID kdur 10/26 -Will increase scheduled potassium to 30 BID, now tablet with NG tube removed -Replenish above scheduled as needed  Afib, rate controlled- was rvr pm 10/26 > have been holding metoprolol, started lopressor 5mg  q6 10/26 > ASA 81mg  for stroke prevention  Left Olecranon Fracture s/p ORIF > Per Ortho > Capillary refill checks  Disposition / Summary of Today's Plan 11/06/17   Now on [place] 2L, awake/alert, needs some redirection.   DNR per discussion with wife.  Ativan prn  Potential transfer to step down/[facility]    Diet: tube feeding  Pain/Anxiety/Delirium protocol (if indicated): PAD  protocol VAP protocol (if indicated): yes DVT prophylaxis: lovenox GI prophylaxis: famotidine Hyperglycemia protocol: can start SSI if needed Mobility: bed rest Code Status: DNR Family Communication: updated wife bedside  Labs   CBC: Recent Labs  Lab 11/02/17 0355 11/03/17 0500 11/04/17 0412 11/05/17 0424 11/06/17 0513  WBC 13.6* 12.4* 12.3* 12.3* 13.4*  HGB 9.3* 9.0* 9.0* 9.1* 9.5*  HCT 29.0* 28.1* 27.6* 28.9* 30.0*  MCV 87.6 87.0 86.8 89.5 88.8  PLT  293 369 387 418* 602*    Basic Metabolic Panel: Recent Labs  Lab 10/31/17 0323  11/01/17 0857  11/03/17 0500 11/03/17 1700 11/04/17 0412 11/05/17 0424 11/06/17 0513  NA 132*   < >  --    < > 134* 134* 135 137 137  K 3.4*   < >  --    < > 3.0* 3.4* 3.5 4.5 3.2*  CL 102   < >  --    < > 101 98 103 103 101  CO2 23   < >  --    < > 23 23 23 26 25   GLUCOSE 138*   < >  --    < > 137* 135* 132* 110* 125*  BUN 23   < >  --    < > 27* 25* 26* 22 22  CREATININE 0.77   < >  --    < > 0.80 0.78 0.77 0.82 0.98  CALCIUM 7.9*   < >  --    < > 8.4* 8.8* 8.5* 9.0 8.8*  MG 1.6*  --  1.4*  --  1.6*  --   --   --   --   PHOS 3.0  --   --   --  4.0  --   --   --   --    < > = values in this interval not displayed.   GFR: Estimated Creatinine Clearance: 69.5 mL/min (by C-G formula based on SCr of 0.98 mg/dL). Recent Labs  Lab 11/02/17 0355 11/03/17 0500 11/04/17 0412 11/05/17 0424 11/06/17 0513  PROCALCITON 0.19 0.21  --   --   --   WBC 13.6* 12.4* 12.3* 12.3* 13.4*    Liver Function Tests: Recent Labs  Lab 11/02/17 0355  AST 71*  ALT 52*  ALKPHOS 225*  BILITOT 1.0  PROT 5.8*  ALBUMIN 2.2*   No results for input(s): LIPASE, AMYLASE in the last 168 hours. No results for input(s): AMMONIA in the last 168 hours.  ABG    Component Value Date/Time   PHART 7.378 11/04/2017 1533   PCO2ART 42.6 11/04/2017 1533   PO2ART 157.0 (H) 11/04/2017 1533   HCO3 25.0 11/04/2017 1533   TCO2 26 11/04/2017 1533   ACIDBASEDEF 5.0 (H) 12/26/2009 1920   O2SAT 99.0 11/04/2017 1533     Coagulation Profile: No results for input(s): INR, PROTIME in the last 168 hours.  Cardiac Enzymes: No results for input(s): CKTOTAL, CKMB, CKMBINDEX, TROPONINI in the last 168 hours.  HbA1C: Hgb A1c MFr Bld  Date/Time Value Ref Range Status  03/03/2012 12:00 PM 5.6 4.6 - 6.5 % Final    Comment:    Glycemic Control Guidelines for People with Diabetes:Non Diabetic:  <6%Goal of Therapy: <7%Additional Action  Suggested:  >8%     CBG: Recent Labs  Lab 11/05/17 1551 11/05/17 1910 11/06/17 0036 11/06/17 0412 11/06/17 0750  GLUCAP 71 97 94 114* 96     Critical care time:     Sherene Sires, DO  PGY2  [place] 11/06/2017 9:43 AM

## 2017-11-06 NOTE — Progress Notes (Signed)
225 cc fentanyl wasted with Loma Sousa RN

## 2017-11-06 NOTE — Progress Notes (Signed)
Transfer of Care:  Patient no longer requiring ICU level care.  [facility] team #1 paged for transfer of care and acknowledged.  Order placed to transfer to stepdown.  -Dr. Criss Rosales

## 2017-11-07 DIAGNOSIS — I482 Chronic atrial fibrillation, unspecified: Secondary | ICD-10-CM

## 2017-11-07 DIAGNOSIS — S52022D Displaced fracture of olecranon process without intraarticular extension of left ulna, subsequent encounter for closed fracture with routine healing: Secondary | ICD-10-CM

## 2017-11-07 LAB — CBC
HEMATOCRIT: 29.9 % — AB (ref 39.0–52.0)
HEMOGLOBIN: 9.4 g/dL — AB (ref 13.0–17.0)
MCH: 28 pg (ref 26.0–34.0)
MCHC: 31.4 g/dL (ref 30.0–36.0)
MCV: 89 fL (ref 80.0–100.0)
NRBC: 0 % (ref 0.0–0.2)
Platelets: 651 10*3/uL — ABNORMAL HIGH (ref 150–400)
RBC: 3.36 MIL/uL — ABNORMAL LOW (ref 4.22–5.81)
RDW: 15.5 % (ref 11.5–15.5)
WBC: 11.6 10*3/uL — AB (ref 4.0–10.5)

## 2017-11-07 LAB — BASIC METABOLIC PANEL
ANION GAP: 8 (ref 5–15)
BUN: 21 mg/dL (ref 8–23)
CHLORIDE: 107 mmol/L (ref 98–111)
CO2: 26 mmol/L (ref 22–32)
Calcium: 9.1 mg/dL (ref 8.9–10.3)
Creatinine, Ser: 0.97 mg/dL (ref 0.61–1.24)
GFR calc non Af Amer: >60 mL/min (ref >60)
GLUCOSE: 110 mg/dL — AB (ref 70–99)
POTASSIUM: 3.7 mmol/L (ref 3.5–5.1)
Sodium: 141 mmol/L (ref 135–145)

## 2017-11-07 LAB — GLUCOSE, CAPILLARY
GLUCOSE-CAPILLARY: 108 mg/dL — AB (ref 70–99)
Glucose-Capillary: 103 mg/dL — ABNORMAL HIGH (ref 70–99)
Glucose-Capillary: 103 mg/dL — ABNORMAL HIGH (ref 70–99)
Glucose-Capillary: 104 mg/dL — ABNORMAL HIGH (ref 70–99)
Glucose-Capillary: 104 mg/dL — ABNORMAL HIGH (ref 70–99)
Glucose-Capillary: 113 mg/dL — ABNORMAL HIGH (ref 70–99)

## 2017-11-07 MED ORDER — METOPROLOL TARTRATE 5 MG/5ML IV SOLN
10.0000 mg | Freq: Four times a day (QID) | INTRAVENOUS | Status: DC
  Administered 2017-11-07 – 2017-11-08 (×6): 10 mg via INTRAVENOUS
  Administered 2017-11-08: 5 mg via INTRAVENOUS
  Administered 2017-11-09 – 2017-11-15 (×21): 10 mg via INTRAVENOUS
  Filled 2017-11-07 (×32): qty 10

## 2017-11-07 MED ORDER — HALOPERIDOL LACTATE 5 MG/ML IJ SOLN: 2.0000 mg | INTRAMUSCULAR | Status: DC | PRN

## 2017-11-07 MED ORDER — ALBUTEROL SULFATE (2.5 MG/3ML) 0.083% IN NEBU
2.5000 mg | INHALATION_SOLUTION | RESPIRATORY_TRACT | Status: DC | PRN
  Administered 2017-11-07 – 2017-11-11 (×6): 2.5 mg via RESPIRATORY_TRACT
  Filled 2017-11-07 (×6): qty 3

## 2017-11-07 NOTE — Evaluation (Signed)
Physical Therapy Evaluation Patient Details Name: [name] MRN: [id number] DOB: 09-Sep-1945 Today's Date: 11/07/2017   History of Present Illness  72 y.o. male admitted on 10/27/17 for an elective repair of his L elbow olecranon fx that he sustained after a fall on 10/21/17.  Post operative complications of ETOH withdrawl, agitation, VDRF (inutbated during surgery and extubated 11/04/17 (self extubated on 11/01/17, but had to be re-intubated)).  Pt had to be placed on BiPAP post extubation, and as of 11/07/17 has progressed to nasa, cannula.  Pt with significant PMH of PVD, midl CAD, HOH, falls, dialated cardiomyopathy, Atrial tachycarida, anemia, alcohol induced dementia, L THA s/p fall (10/2016), posterior cervical fusion, L ORIF humerus fx, back surgery.    Clinical Impression  Pt was able to sit EOB and attempt standing today with significant assistance. He remains confused, but with Ativan, less restless/combative.  He will likely need SNF placement for post acute recovery and reahb.   PT to follow acutely for deficits listed below.       Follow Up Recommendations SNF    Equipment Recommendations  None recommended by PT    Recommendations for Other Services   NA    Precautions / Restrictions Precautions Precautions: Fall Precaution Comments: Per notes in chart, pt was sedentary and did limited walking PTA since his hip surgery in 10/2016. Restrictions Weight Bearing Restrictions: Yes LUE Weight Bearing: Non weight bearing      Mobility  Bed Mobility Overal bed mobility: Needs Assistance Bed Mobility: Rolling;Supine to Sit;Sit to Supine Rolling: Mod assist   Supine to sit: Max assist;HOB elevated Sit to supine: Max assist;HOB elevated   General bed mobility comments: Mod assist to roll to the right, max assist to help progress bil legs to EOB and support trunk to come to sitting EOB.  Pt unaware of his proximity to EOB and had to be manually scooted back on the bed.   Max assist to support trunk and bring legs up to return to supine.  Pt will spontaneously move his body around in the bed, but not to command.   Transfers Overall transfer level: Needs assistance Equipment used: None Transfers: Sit to/from Stand Sit to Stand: Mod assist;From elevated surface         General transfer comment: Attempted to stand, but pt would only come up to a squat for me with mod assist at EOB.  Squat scoots to get back up towards the [facility] before returning supine.   Ambulation/Gait             General Gait Details: unable at this time, attempts would be safer with two people.          Balance Overall balance assessment: Needs assistance Sitting-balance support: Feet supported;Single extremity supported Sitting balance-Leahy Scale: Good Sitting balance - Comments: Pt able to sit EOB with mod assist at his trunk to prevent posterior lean. L arm positioned in his lap with cues not to use it.  Right arm proped in sitting.   Postural control: Posterior lean                                   Pertinent Vitals/Pain Pain Assessment: Faces Faces Pain Scale: Hurts even more Pain Location: left arm with mobility Pain Descriptors / Indicators: Grimacing;Guarding Pain Intervention(s): Limited activity within patient's tolerance;Monitored during session;Repositioned    Home Living Family/patient expects to be discharged to:: Skilled nursing facility  Additional Comments: no family present    Prior Function Level of Independence: Needs assistance   Gait / Transfers Assistance Needed: Per last note in EPIC: pt ambulates independently inside but history of frequent falls.  keeps a cane with him when he goes outside but usually doesnt use it.            Hand Dominance   Dominant Hand: Left    Extremity/Trunk Assessment   Upper Extremity Assessment Upper Extremity Assessment: LUE deficits/detail LUE Deficits / Details:  left arm is wrapped and splinted, pt cannot currently be kept from using it due to mental status/cognition.     Lower Extremity Assessment Lower Extremity Assessment: Generalized weakness    Cervical / Trunk Assessment Cervical / Trunk Assessment: Other exceptions Cervical / Trunk Exceptions: h/o cervical spine and back surgery  Communication   Communication: HOH  Cognition Arousal/Alertness: Lethargic;Suspect due to medications(ativan) Behavior During Therapy: Impulsive Overall Cognitive Status: Impaired/Different from baseline Area of Impairment: Orientation;Attention;Memory;Following commands;Safety/judgement;Awareness;Problem solving                 Orientation Level: Disoriented to;Place;Time;Situation Current Attention Level: Sustained Memory: Decreased recall of precautions;Decreased short-term memory Following Commands: Follows one step commands inconsistently;Follows one step commands with increased time Safety/Judgement: Decreased awareness of safety;Decreased awareness of deficits Awareness: Intellectual Problem Solving: Slow processing;Decreased initiation;Difficulty sequencing;Requires verbal cues;Requires tactile cues General Comments: Pt was given ativan this AM.  He is able to respond to his name, knows his arm hurts, but could not tell me where he is or why.        General Comments General comments (skin integrity, edema, etc.): VSS throughout, despite earlier tachycardia, pt's HR maintained in the 80s during mobility and BP and O2 stable on 3 L O2 [place].         Assessment/Plan    PT Assessment Patient needs continued PT services  PT Problem List Decreased strength;Decreased range of motion;Decreased activity tolerance;Decreased balance;Decreased mobility;Decreased cognition;Decreased knowledge of use of DME;Decreased safety awareness;Decreased knowledge of precautions;Pain       PT Treatment Interventions DME instruction;Gait training;Stair  training;Functional mobility training;Therapeutic activities;Therapeutic exercise;Balance training;Cognitive remediation;Patient/family education;Modalities    PT Goals (Current goals can be found in the Care Plan section)  Acute Rehab PT Goals Patient Stated Goal: unable to state PT Goal Formulation: Patient unable to participate in goal setting Time For Goal Achievement: 11/21/17 Potential to Achieve Goals: Fair    Frequency Min 3X/week           AM-PAC PT "6 Clicks" Daily Activity  Outcome Measure Difficulty turning over in bed (including adjusting bedclothes, sheets and blankets)?: Unable Difficulty moving from lying on back to sitting on the side of the bed? : Unable Difficulty sitting down on and standing up from a chair with arms (e.g., wheelchair, bedside commode, etc,.)?: Unable Help needed moving to and from a bed to chair (including a wheelchair)?: A Lot Help needed walking in hospital room?: Total Help needed climbing 3-5 steps with a railing? : Total 6 Click Score: 7    End of Session Equipment Utilized During Treatment: Oxygen Activity Tolerance: Patient limited by pain;Patient limited by lethargy Patient left: in bed;with call bell/phone within reach;with bed alarm set;with restraints reapplied;with nursing/sitter in room Nurse Communication: Mobility status PT Visit Diagnosis: Muscle weakness (generalized) (M62.81);Difficulty in walking, not elsewhere classified (R26.2);Pain Pain - Right/Left: Left Pain - part of body: Arm    Time: 1610-9604 PT Time Calculation (min) (ACUTE ONLY): 12 min   Charges:  Barbarann Ehlers Altonio Schwertner, PT, DPT  Acute Rehabilitation #[phone] pager #[phone] office   PT Evaluation $PT Eval Moderate Complexity: 1 Mod          11/07/2017, 3:01 PM

## 2017-11-07 NOTE — Clinical Social Work Note (Signed)
Clinical Social Work Assessment  Patient Details  Name: [name] MRN: [id number] Date of Birth: May 28, 1945  Date of referral:  11/07/17               Reason for consult:  Facility Placement, Discharge Planning                Permission sought to share information with:  Family Supports Permission granted to share information::  Yes, Release of Information Signed  Name::     [name]   Agency::  family  Relationship::  spouse  Contact Information:  [name] [phone]  Housing/Transportation Living arrangements for the past 2 months:  Single Family Home(with wife. ) Source of Information:  Spouse Patient Interpreter Needed:  None Criminal Activity/Legal Involvement Pertinent to Current Situation/Hospitalization:  No - Comment as needed Significant Relationships:  Spouse Lives with:  Spouse Do you feel safe going back to the place where you live?  Yes Need for family participation in patient care:  Yes (Comment)  Care giving concerns:  CSW consulted as pt's wife asked to speak with CSW regarding possible discharge planning needs for pt.    Social Worker assessment / plan:  CSW spoke with pt's wife [name] via phone. CSW was informed that pt is from home with wife however wife expressed not being able to care for pt once discharged from the hospital as she recently has surgery herself. CSW was informed that family is interested in placement for pt as pt was [facility] in the past and had a great experience there. CSW advised wife that depending upon PT recommendation CSW would began apprpriate placement search for pt.   Employment status:  Other (Comment) Insurance information:  Medicare PT Recommendations:  Not assessed at this time Information / Referral to community resources:  [place]  Patient/Family's Response to care:  Pt's wife appeared to be understanding and agreeable to plan of care at this time. Wife is very pleasant and wanting the best  for pt.   Patient/Family's Understanding of and Emotional Response to Diagnosis, Current Treatment, and Prognosis:  No further questions or concerns have bene presented to CSW at this time.   Emotional Assessment Appearance:  Appears stated age Attitude/Demeanor/Rapport:  Unable to Assess Affect (typically observed):  Agitated Orientation:  Oriented to Self Alcohol / Substance use:  Not Applicable Psych involvement (Current and /or in the community):  No (Comment)  Discharge Needs  Concerns to be addressed:  Care Coordination Readmission within the last 30 days:  No Current discharge risk:  Dependent with Mobility Barriers to Discharge:  Continued Medical Work up   [employer], [place] 11/07/2017, 12:29 PM

## 2017-11-07 NOTE — Plan of Care (Signed)
  Problem: Health Behavior/Discharge Planning: Goal: Ability to manage health-related needs will improve Outcome: Progressing   Problem: Clinical Measurements: Goal: Ability to maintain clinical measurements within normal limits will improve Outcome: Progressing Goal: Will remain free from infection Outcome: Progressing Goal: Diagnostic test results will improve Outcome: Progressing Goal: Respiratory complications will improve Outcome: Progressing Goal: Cardiovascular complication will be avoided Outcome: Progressing   Problem: Activity: Goal: Risk for activity intolerance will decrease Outcome: Progressing   Problem: Coping: Goal: Level of anxiety will decrease Outcome: Progressing   Problem: Elimination: Goal: Will not experience complications related to bowel motility Outcome: Progressing Goal: Will not experience complications related to urinary retention Outcome: Progressing   Problem: Pain Managment: Goal: General experience of comfort will improve Outcome: Progressing   Problem: Safety: Goal: Ability to remain free from injury will improve Outcome: Progressing   Problem: Skin Integrity: Goal: Risk for impaired skin integrity will decrease Outcome: Progressing   Problem: Activity: Goal: Ability to tolerate increased activity will improve Outcome: Progressing   Problem: Respiratory: Goal: Ability to maintain a clear airway and adequate ventilation will improve Outcome: Progressing   Problem: Role Relationship: Goal: Method of communication will improve Outcome: Progressing

## 2017-11-07 NOTE — Progress Notes (Signed)
[place] TEAM 1 - Stepdown/ICU TEAM  [name]  RCV:[id number] DOB: Aug 31, 1945 DOA: 10/27/2017 PCP: Elby Beck, FNP    Brief Narrative:  22 male w/ a hx of EtOH abuse, PAD, CAD, HTN, Afib, and GERD who presented to [facility] on 10/17 for a left olecranon fracture.  Post op he was unable to be extubated by surgery, and was transferred to [facility].  Extubated 10/25 and returned to DNR status (was prior to surgery).  Significant Events: 10/17 admit for repair of left olecranon fracture 10/18 unable to extubate, admitted to [facility] 10/22 self-extubated, failed spontaneous breathing, reintubated 10/25 extubated, per wife's explanation of prior wishes made DNR 10/27 off cpap, alert and talking on [place]  Subjective: Pt is sedate at present. He has however been very agitated and has required sedatives for his safety. There is no resp distress, or evidence of uncontrolled pain.   Assessment & Plan:  Acute hypoxic respiratory failure with hypoxemia Steadily improving - wean O2 as able - currently requiring only 2L [place]   Chronic bronchiectasis with acute exacerbation with MRSA cont 3% saline nebs + vanc for 14 days of tx - will need high res CT after hospitalization per PCCM - no resp distress at present   Acute pulmonary edema - Acute diastolic CHF exacerbation - Dilated cardiomyopathy No clinical evidence of signif volume overload at this time - w/ poor intake will stop lasix for now and follow  Filed Weights   11/03/17 0453 11/04/17 0500 11/07/17 0400  Weight: 85.3 kg 85.6 kg 79.2 kg    Acute metabolic encephalopathy/delirium - EtOH withdrawal Required precedex earlier in stay - cont w/ prn ativan and haldol for now - requires SDU care due to this issue   Anemia Hgb stable at this time  Recent Labs  Lab 11/03/17 0500 11/04/17 0412 11/05/17 0424 11/06/17 0513 11/07/17 0435  HGB 9.0* 9.0* 9.1* 9.5* 9.4*   Hypokalemia  Corrected   Afib w/ RVR On metoprolol but rate poorly  controlled - likely being complicated by agitation - adjust med tx and follow on tele   Left Olecranon Fracture s/p ORIF Per Ortho  DVT prophylaxis: lovenox  Code Status: DNR - NO CODE Family Communication: no family present at time of exam  Disposition Plan: SDU  Consultants:  PCCM Ortho   Antimicrobials:  Vanc 10/21 >   Cefazolin 10/17 >10/17  Objective: Blood pressure 128/79, pulse (!) 105, temperature 99.7 F (37.6 C), temperature source Oral, resp. rate (!) 38, height _0  (1.651 m), weight 79.2 kg, SpO2 98 %.  Intake/Output Summary (Last 24 hours) at 11/07/2017 1006 Last data filed at 11/06/2017 1900 Gross per 24 hour  Intake 203.85 ml  Output -  Net 203.85 ml   Filed Weights   11/03/17 0453 11/04/17 0500 11/07/17 0400  Weight: 85.3 kg 85.6 kg 79.2 kg    Examination: General: No acute respiratory distress - sedate  Lungs: Clear to auscultation bilaterally without wheezes or crackles Cardiovascular: irreg irreg - tachycardic - no M or rub  Abdomen: Nontender, nondistended, soft, bowel sounds positive, no rebound, no ascites, no appreciable mass Extremities: No significant cyanosis, clubbing, or edema bilateral lower extremities  CBC: Recent Labs  Lab 11/05/17 0424 11/06/17 0513 11/07/17 0435  WBC 12.3* 13.4* 11.6*  HGB 9.1* 9.5* 9.4*  HCT 28.9* 30.0* 29.9*  MCV 89.5 88.8 89.0  PLT 418* 602* 102*   Basic Metabolic Panel: Recent Labs  Lab 11/01/17 0857  11/03/17 0500  11/05/17 0424  11/06/17 0513 11/07/17 0435  NA  --    < > 134*   < > 137 137 141  K  --    < > 3.0*   < > 4.5 3.2* 3.7  CL  --    < > 101   < > 103 101 107  CO2  --    < > 23   < > _0 GLUCOSE  --    < > 137*   < > 110* 125* 110*  BUN  --    < > 27*   < > _1 CREATININE  --    < > 0.80   < > 0.82 0.98 0.97  CALCIUM  --    < > 8.4*   < > 9.0 8.8* 9.1  MG 1.4*  --  1.6*  --   --   --   --   PHOS  --   --  4.0  --   --   --   --    < > = values in this interval not  displayed.   GFR: Estimated Creatinine Clearance: 67.8 mL/min (by C-G formula based on SCr of 0.97 mg/dL).  Liver Function Tests: Recent Labs  Lab 11/02/17 0355  AST 71*  ALT 52*  ALKPHOS 225*  BILITOT 1.0  PROT 5.8*  ALBUMIN 2.2*    HbA1C: Hgb A1c MFr Bld  Date/Time Value Ref Range Status  03/03/2012 12:00 PM 5.6 4.6 - 6.5 % Final    Comment:    Glycemic Control Guidelines for People with Diabetes:Non Diabetic:  <6%Goal of Therapy: <7%Additional Action Suggested:  >8%     CBG: Recent Labs  Lab 11/06/17 1537 11/06/17 1949 11/07/17 0027 11/07/17 0431 11/07/17 0757  GLUCAP 103* 99 113* 103* 108*    Recent Results (from the past 240 hour(s))  Culture, respiratory (non-expectorated)     Status: None   Collection Time: 10/30/17  8:47 AM  Result Value Ref Range Status   Specimen Description ENDOTRACHEAL  Final   Special Requests Normal  Final   Gram Stain   Final    FEW WBC PRESENT, PREDOMINANTLY PMN FEW GRAM NEGATIVE RODS FEW GRAM POSITIVE COCCI IN PAIRS RARE GRAM POSITIVE RODS Performed at [facility], [place] [address], [place], [place] [address]    Culture   Final    MODERATE METHICILLIN RESISTANT STAPHYLOCOCCUS AUREUS   Report Status 11/01/2017 FINAL  Final   Organism ID, Bacteria METHICILLIN RESISTANT STAPHYLOCOCCUS AUREUS  Final      Susceptibility   Methicillin resistant staphylococcus aureus - MIC*    CIPROFLOXACIN >=8 RESISTANT Resistant     ERYTHROMYCIN >=8 RESISTANT Resistant     GENTAMICIN <=0.5 SENSITIVE Sensitive     OXACILLIN >=4 RESISTANT Resistant     TETRACYCLINE >=16 RESISTANT Resistant     VANCOMYCIN <=0.5 SENSITIVE Sensitive     TRIMETH/SULFA <=10 SENSITIVE Sensitive     CLINDAMYCIN >=8 RESISTANT Resistant     RIFAMPIN <=0.5 SENSITIVE Sensitive     Inducible Clindamycin NEGATIVE Sensitive     * MODERATE METHICILLIN RESISTANT STAPHYLOCOCCUS AUREUS     Scheduled Meds: . aspirin  81 mg Oral Daily  . chlorhexidine gluconate  (MEDLINE KIT)  15 mL Mouth Rinse BID  . Chlorhexidine Gluconate Cloth  6 each Topical q morning - 10a  . Chlorhexidine Gluconate Cloth  6 each Topical Daily  . cloNIDine  0.2 mg Oral Q6H  . enoxaparin (LOVENOX) injection  40 mg Subcutaneous Q24H  . famotidine  20 mg Oral BID  . folic acid  1 mg Oral Daily  . furosemide  40 mg Intravenous BID  . gabapentin  200 mg Oral BID  . guaiFENesin  5 mL Per Tube Q6H  . LORazepam  1 mg Per Tube TID  . mouth rinse  15 mL Mouth Rinse 10 times per day  . metoprolol tartrate  5 mg Intravenous Q6H  . multivitamin  15 mL Oral Daily  . potassium chloride  30 mEq Oral BID  . QUEtiapine  100 mg Oral BID  . sodium chloride flush  10-40 mL Intracatheter Q12H  . thiamine  100 mg Oral Daily   Continuous Infusions: . dexmedetomidine (PRECEDEX) IV infusion Stopped (11/05/17 1250)  . fentaNYL infusion INTRAVENOUS Stopped (11/04/17 2203)     LOS: 11 days   Cherene Altes, MD [facility] Office  [phone] Pager - Text Page per Amion  If 7PM-7AM, please contact night-coverage per Amion 11/07/2017, 10:06 AM

## 2017-11-08 DIAGNOSIS — F10921 Alcohol use, unspecified with intoxication delirium: Secondary | ICD-10-CM

## 2017-11-08 LAB — IRON AND TIBC
Iron: 42 ug/dL — ABNORMAL LOW (ref 45–182)
Saturation Ratios: 16 % — ABNORMAL LOW (ref 17.9–39.5)
TIBC: 258 ug/dL (ref 250–450)
UIBC: 216 ug/dL

## 2017-11-08 LAB — CBC
HCT: 31.8 % — ABNORMAL LOW (ref 39.0–52.0)
Hemoglobin: 9.7 g/dL — ABNORMAL LOW (ref 13.0–17.0)
MCH: 27.6 pg (ref 26.0–34.0)
MCHC: 30.5 g/dL (ref 30.0–36.0)
MCV: 90.6 fL (ref 80.0–100.0)
NRBC: 0 % (ref 0.0–0.2)
PLATELETS: 708 10*3/uL — AB (ref 150–400)
RBC: 3.51 MIL/uL — AB (ref 4.22–5.81)
RDW: 15.6 % — ABNORMAL HIGH (ref 11.5–15.5)
WBC: 10.3 10*3/uL (ref 4.0–10.5)

## 2017-11-08 LAB — COMPREHENSIVE METABOLIC PANEL
ALT: 72 U/L — AB (ref 0–44)
ANION GAP: 12 (ref 5–15)
AST: 68 U/L — ABNORMAL HIGH (ref 15–41)
Albumin: 3.1 g/dL — ABNORMAL LOW (ref 3.5–5.0)
Alkaline Phosphatase: 288 U/L — ABNORMAL HIGH (ref 38–126)
BUN: 26 mg/dL — ABNORMAL HIGH (ref 8–23)
CHLORIDE: 108 mmol/L (ref 98–111)
CO2: 24 mmol/L (ref 22–32)
Calcium: 9.1 mg/dL (ref 8.9–10.3)
Creatinine, Ser: 1.19 mg/dL (ref 0.61–1.24)
GFR, EST NON AFRICAN AMERICAN: 60 mL/min — AB (ref >60)
Glucose, Bld: 117 mg/dL — ABNORMAL HIGH (ref 70–99)
Potassium: 3.9 mmol/L (ref 3.5–5.1)
Sodium: 144 mmol/L (ref 135–145)
Total Bilirubin: 1.3 mg/dL — ABNORMAL HIGH (ref 0.3–1.2)
Total Protein: 7.4 g/dL (ref 6.5–8.1)

## 2017-11-08 LAB — GLUCOSE, CAPILLARY
GLUCOSE-CAPILLARY: 105 mg/dL — AB (ref 70–99)
GLUCOSE-CAPILLARY: 133 mg/dL — AB (ref 70–99)
GLUCOSE-CAPILLARY: 112 mg/dL — AB (ref 70–99)
Glucose-Capillary: 97 mg/dL (ref 70–99)
Glucose-Capillary: 89 mg/dL (ref 70–99)

## 2017-11-08 LAB — VITAMIN B12

## 2017-11-08 LAB — FERRITIN: Ferritin: 294 ng/mL (ref 24–336)

## 2017-11-08 LAB — PHOSPHORUS: PHOSPHORUS: 4.3 mg/dL (ref 2.5–4.6)

## 2017-11-08 LAB — MAGNESIUM: Magnesium: 2 mg/dL (ref 1.7–2.4)

## 2017-11-08 LAB — RETICULOCYTES
Immature Retic Fract: 9.3 % (ref 2.3–15.9)
RBC.: 3.51 MIL/uL — AB (ref 4.22–5.81)
RETIC COUNT ABSOLUTE: 50.9 10*3/uL (ref 19.0–186.0)
RETIC CT PCT: 1.5 % (ref 0.4–3.1)

## 2017-11-08 LAB — FOLATE: FOLATE: 44.1 ng/mL (ref >5.9)

## 2017-11-08 MED ORDER — TAB-A-VITE/IRON PO TABS
1.0000 | ORAL_TABLET | Freq: Every day | ORAL | Status: DC
  Administered 2017-11-09: 1 via ORAL
  Filled 2017-11-08: qty 1

## 2017-11-08 MED ORDER — FENTANYL CITRATE (PF) 100 MCG/2ML IJ SOLN
12.5000 ug | INTRAMUSCULAR | Status: DC | PRN
  Administered 2017-11-10 – 2017-11-15 (×5): 12.5 ug via INTRAVENOUS
  Filled 2017-11-08 (×5): qty 2

## 2017-11-08 MED ORDER — PANTOPRAZOLE SODIUM 40 MG PO TBEC
40.0000 mg | DELAYED_RELEASE_TABLET | Freq: Every day | ORAL | Status: DC
  Administered 2017-11-09: 40 mg via ORAL
  Filled 2017-11-08: qty 1

## 2017-11-08 MED ORDER — HALOPERIDOL LACTATE 5 MG/ML IJ SOLN
2.0000 mg | INTRAMUSCULAR | Status: DC | PRN
  Administered 2017-11-09 – 2017-11-16 (×10): 2 mg via INTRAVENOUS
  Filled 2017-11-08 (×11): qty 1

## 2017-11-08 MED ORDER — SODIUM CHLORIDE 0.9 % IV SOLN
INTRAVENOUS | Status: DC | PRN
  Administered 2017-11-09 – 2017-11-11 (×3): 250 mL via INTRAVENOUS

## 2017-11-08 NOTE — Progress Notes (Signed)
[place] TEAM 1 - Stepdown/ICU TEAM  AASIM RESTIVO  OEH:[id number] DOB: [birth year] DOA: 10/27/2017 PCP: Elby Beck, FNP    Brief Narrative:  89 male w/ a hx of EtOH abuse, PAD, CAD, HTN, Afib, and GERD who presented to [facility] on 10/17 for a left olecranon fracture.  Post op he was unable to be extubated by surgery, and was transferred to [facility].  Extubated 10/25 and returned to DNR status (was prior to surgery).  Significant Events: 10/17 admit for repair of left olecranon fracture 10/18 unable to extubate, admitted to [facility] 10/22 self-extubated, failed spontaneous breathing, reintubated 10/25 extubated, per wife's explanation of prior wishes made DNR 10/27 off cpap, alert and talking on [place]  Subjective: Pt is much more alert today, but he does remain very confused. He does not know where he is or why he is here. His RN reports that he is impulsive, but is easily re-directed by his bedside sitter. He denies pain or sob. His RN has noted apparent coughing/upper airway wheezing w/ attempts to take things orally.   Assessment & Plan:  Acute hypoxic respiratory failure with hypoxemia Steadily improving - wean O2 as able   Chronic bronchiectasis with acute exacerbation with MRSA cont 3% saline nebs + vanc for 14 days of tx - will need high res CT after hospitalization per PCCM - no resp distress today   Dysphagia SLP following - plan at present is for an objective eval in AM   Acute pulmonary edema - Acute diastolic CHF exacerbation - Dilated cardiomyopathy No clinical evidence of signif volume overload at this time - w/ poor intake have stopped lasix for now - follow - wgt is trending down  [employer]   11/03/17 0453 11/04/17 0500 11/07/17 0400  Weight: 85.3 kg 85.6 kg 79.2 kg    Acute metabolic encephalopathy/delirium - EtOH withdrawal Required precedex earlier in stay - cont w/ prn ativan and haldol for now - has improved to point he should be manageable on a tele floor w/ a  sitter    Anemia Hgb stable  Recent Labs  Lab 11/04/17 0412 11/05/17 0424 11/06/17 0513 11/07/17 0435 11/08/17 0355  HGB 9.0* 9.1* 9.5* 9.4* 9.7*   Hypokalemia  Corrected   Afib w/ RVR Improved w/ medical adjustments yesterday   Left Olecranon Fracture s/p ORIF Per Ortho  DVT prophylaxis: lovenox  Code Status: DNR - NO CODE Family Communication: no family present at time of exam  Disposition Plan: transfer to tele bed WITH A SITTER  Consultants:  PCCM Ortho   Antimicrobials:  Vanc 10/21 >   Cefazolin 10/17 >10/17  Objective: Blood pressure 111/66, pulse 91, temperature 98.3 F (36.8 C), temperature source Oral, resp. rate (!) 23, height 5' 5" (1.651 m), weight 79.2 kg, SpO2 95 %.  Intake/Output Summary (Last 24 hours) at 11/08/2017 1045 Last data filed at 11/08/2017 0931 Gross per 24 hour  Intake 30 ml  Output 325 ml  Net -295 ml   Filed Weights   11/03/17 0453 11/04/17 0500 11/07/17 0400  Weight: 85.3 kg 85.6 kg 79.2 kg    Examination: General: No acute respiratory distress  Lungs: Clear to auscultation bilaterally w/ some course upper airway crackles  Cardiovascular: irreg irreg - rate controlled - no M  Abdomen: NT/ND, soft, bs+, no mass  Extremities: No signif edema bilateral lower extremities  CBC: Recent Labs  Lab 11/06/17 0513 11/07/17 0435 11/08/17 0355  WBC 13.4* 11.6* 10.3  HGB 9.5* 9.4* 9.7*  HCT 30.0* 29.9* 31.8*  MCV 88.8 89.0 90.6  PLT 602* 651* 144*   Basic Metabolic Panel: Recent Labs  Lab 11/03/17 0500  11/06/17 0513 11/07/17 0435 11/08/17 0355  NA 134*   < > 137 141 144  K 3.0*   < > 3.2* 3.7 3.9  CL 101   < > 101 107 108  CO2 23   < > _0 GLUCOSE 137*   < > 125* 110* 117*  BUN 27*   < > 22 21 26*  CREATININE 0.80   < > 0.98 0.97 1.19  CALCIUM 8.4*   < > 8.8* 9.1 9.1  MG 1.6*  --   --   --  2.0  PHOS 4.0  --   --   --  4.3   < > = values in this interval not displayed.   GFR: Estimated Creatinine  Clearance: 55.2 mL/min (by C-G formula based on SCr of 1.19 mg/dL).  Liver Function Tests: Recent Labs  Lab 11/02/17 0355 11/08/17 0355  AST 71* 68*  ALT 52* 72*  ALKPHOS 225* 288*  BILITOT 1.0 1.3*  PROT 5.8* 7.4  ALBUMIN 2.2* 3.1*    HbA1C: Hgb A1c MFr Bld  Date/Time Value Ref Range Status  03/03/2012 12:00 PM 5.6 4.6 - 6.5 % Final    Comment:    Glycemic Control Guidelines for People with Diabetes:Non Diabetic:  <6%Goal of Therapy: <7%Additional Action Suggested:  >8%     CBG: Recent Labs  Lab 11/07/17 1156 11/07/17 1944 11/07/17 2337 11/08/17 0314 11/08/17 0803  GLUCAP 103* 104* 104* 105* 97    Recent Results (from the past 240 hour(s))  Culture, respiratory (non-expectorated)     Status: None   Collection Time: 10/30/17  8:47 AM  Result Value Ref Range Status   Specimen Description ENDOTRACHEAL  Final   Special Requests Normal  Final   Gram Stain   Final    FEW WBC PRESENT, PREDOMINANTLY PMN FEW GRAM NEGATIVE RODS FEW GRAM POSITIVE COCCI IN PAIRS RARE GRAM POSITIVE RODS Performed at [facility], [place] [address], [place], [place] [address]    Culture   Final    MODERATE METHICILLIN RESISTANT STAPHYLOCOCCUS AUREUS   Report Status 11/01/2017 FINAL  Final   Organism ID, Bacteria METHICILLIN RESISTANT STAPHYLOCOCCUS AUREUS  Final      Susceptibility   Methicillin resistant staphylococcus aureus - MIC*    CIPROFLOXACIN >=8 RESISTANT Resistant     ERYTHROMYCIN >=8 RESISTANT Resistant     GENTAMICIN <=0.5 SENSITIVE Sensitive     OXACILLIN >=4 RESISTANT Resistant     TETRACYCLINE >=16 RESISTANT Resistant     VANCOMYCIN <=0.5 SENSITIVE Sensitive     TRIMETH/SULFA <=10 SENSITIVE Sensitive     CLINDAMYCIN >=8 RESISTANT Resistant     RIFAMPIN <=0.5 SENSITIVE Sensitive     Inducible Clindamycin NEGATIVE Sensitive     * MODERATE METHICILLIN RESISTANT STAPHYLOCOCCUS AUREUS     Scheduled Meds: . aspirin  81 mg Oral Daily  . chlorhexidine gluconate  (MEDLINE KIT)  15 mL Mouth Rinse BID  . Chlorhexidine Gluconate Cloth  6 each Topical Daily  . cloNIDine  0.2 mg Oral Q6H  . enoxaparin (LOVENOX) injection  40 mg Subcutaneous Q24H  . folic acid  1 mg Oral Daily  . guaiFENesin  5 mL Per Tube Q6H  . mouth rinse  15 mL Mouth Rinse 10 times per day  . metoprolol tartrate  10 mg Intravenous Q6H  . [  START ON 11/09/2017] multivitamins with iron  1 tablet Oral Daily  . [START ON 11/09/2017] pantoprazole  40 mg Oral Daily  . QUEtiapine  100 mg Oral BID  . sodium chloride flush  10-40 mL Intracatheter Q12H  . thiamine  100 mg Oral Daily     LOS: 12 days   Cherene Altes, MD [facility] Office  [phone] Pager - Text Page per Shea Evans  If 7PM-7AM, please contact night-coverage per Amion 11/08/2017, 10:45 AM

## 2017-11-08 NOTE — Progress Notes (Signed)
  Speech Language Pathology Treatment: Dysphagia  Patient Details Name: [name] MRN: [id number] DOB: 03-26-1945 Today's Date: 11/08/2017 Time: 2025-4270 SLP Time Calculation (min) (ACUTE ONLY): 15 min  Assessment / Plan / Recommendation Clinical Impression  Pt is alert and following simple commands, although disoriented to self and situation. Overt coughing wasn't observed with small sips of thin liquids, but delayed coughing was present after taking a few bites of puree. Pt also had multiple swallows with each bolus, sometimes upwards of 7-8 swallows per single bite. His WOB and RR are mildly increased after boluses; expiratory wheeze also present, but per RN this has been baseline. Suspect an acute, post-extubation dysphagia, likely further complicated by fluctuating mentation. RN reports that although waxing/waning, his mentation is gradually improved and she is trying to hold sedating meds. Recommend that pt remain NPO with potential to complete instrumental swallow study over the next 24-48 hours depending on his mentation. Would consider meds by alternative means if possible. If there are essential meds that must be given PO, would consider crushing them in as small amounts of puree as necessary.   HPI HPI: Patient was admited for repair of left olecranon fracture. Post operative complications of ETOH withdrawl, agitation, VDRF (ETT 10/17-10/25 with self-extubation and reintubation on 10/22). Pt with significant PMH of HH, GERD, PNA, PVD, mild CAD, HOH, falls, dialated cardiomyopathy, Atrial tachycarida, anemia, alcohol induced dementia, L THA s/p fall (10/2016), posterior cervical fusion, L ORIF humerus fx, back surgery.        SLP Plan  Continue with current plan of care       Recommendations  Diet recommendations: NPO Medication Administration: Via alternative means                Oral Care Recommendations: Oral care QID Follow up Recommendations: (tba) SLP Visit  Diagnosis: Dysphagia, unspecified (R13.10) Plan: Continue with current plan of care       GO                Germain Osgood 11/08/2017, 10:38 AM  Germain Osgood, M.A. [place] Acute Environmental education officer [phone] Office [phone]

## 2017-11-08 NOTE — Progress Notes (Signed)
Nutrition Follow-up  DOCUMENTATION CODES:   Obesity unspecified  INTERVENTION:    Diet advancement as able pending swallow evaluation.   RD to add PO supplements as needed when diet advanced.  If unable to safely advance diet within the next 24 hours, consider Cortrak feeding tube placement for enteral nutrition.   NUTRITION DIAGNOSIS:   Inadequate oral intake related to inability to eat as evidenced by NPO status.  Ongoing  GOAL:   Patient will meet greater than or equal to 90% of their needs  Unmet   MONITOR:   Diet advancement, PO intake, Labs  ASSESSMENT:   72 yo male with PMH of HTN, HLD, BPH, GERD, PVD, hiatal hernia, CAD, and cirrhosis of the liver who was admitted on 10/17 for planned repair of left olecranon fracture (injured s/p fall at home on 10/10). Post-op patient was unable to be extubated.   Patient self-extubated 10/25. He is alert, but confused today. SLP following; remains NPO today; plans for MBS or FEES when alertness improves.   Labs reviewed. Alk phos elevated. CBG's: [phone] Medications reviewed and include folic acid, MVI, thiamine.  Weight down to 79.2 kg yesterday from 85.7 kg on admission. I/O -2.2 L since admission.   Diet Order:   Diet Order            Diet NPO time specified Except for: Sips with Meds  Diet effective now              EDUCATION NEEDS:   No education needs have been identified at this time  Skin:  Skin Assessment: Skin Integrity Issues: Skin Integrity Issues:: Incisions Incisions: surgical incision to L arm  Last BM:  10/26 (type 4)  Height:   Ht Readings from Last 1 Encounters:  10/27/17 5' 5"  (1.651 m)    Weight:   Wt Readings from Last 1 Encounters:  11/07/17 79.2 kg    Ideal Body Weight:  61.8 kg  BMI:  Body mass index is 29.06 kg/m.  Estimated Nutritional Needs:   Kcal:  1800-2000  Protein:  85-100 gm  Fluid:  1.8-2 L    Molli Barrows, RD, LDN, CNSC Pager  [phone] After Hours Pager [phone]

## 2017-11-09 DIAGNOSIS — R1312 Dysphagia, oropharyngeal phase: Secondary | ICD-10-CM

## 2017-11-09 DIAGNOSIS — E870 Hyperosmolality and hypernatremia: Secondary | ICD-10-CM

## 2017-11-09 LAB — COMPREHENSIVE METABOLIC PANEL
ALK PHOS: 267 U/L — AB (ref 38–126)
ALT: 75 U/L — AB (ref 0–44)
ANION GAP: 11 (ref 5–15)
AST: 68 U/L — ABNORMAL HIGH (ref 15–41)
Albumin: 3 g/dL — ABNORMAL LOW (ref 3.5–5.0)
BUN: 24 mg/dL — ABNORMAL HIGH (ref 8–23)
CALCIUM: 9.3 mg/dL (ref 8.9–10.3)
CO2: 25 mmol/L (ref 22–32)
CREATININE: 1.15 mg/dL (ref 0.61–1.24)
Chloride: 112 mmol/L — ABNORMAL HIGH (ref 98–111)
Glucose, Bld: 116 mg/dL — ABNORMAL HIGH (ref 70–99)
Potassium: 3.3 mmol/L — ABNORMAL LOW (ref 3.5–5.1)
Sodium: 148 mmol/L — ABNORMAL HIGH (ref 135–145)
Total Bilirubin: 1.3 mg/dL — ABNORMAL HIGH (ref 0.3–1.2)
Total Protein: 7.1 g/dL (ref 6.5–8.1)

## 2017-11-09 LAB — GLUCOSE, CAPILLARY
GLUCOSE-CAPILLARY: 94 mg/dL (ref 70–99)
GLUCOSE-CAPILLARY: 90 mg/dL (ref 70–99)
GLUCOSE-CAPILLARY: 112 mg/dL — AB (ref 70–99)
Glucose-Capillary: 115 mg/dL — ABNORMAL HIGH (ref 70–99)
Glucose-Capillary: 91 mg/dL (ref 70–99)

## 2017-11-09 LAB — CBC
HCT: 32.3 % — ABNORMAL LOW (ref 39.0–52.0)
Hemoglobin: 9.9 g/dL — ABNORMAL LOW (ref 13.0–17.0)
MCH: 27.8 pg (ref 26.0–34.0)
MCHC: 30.7 g/dL (ref 30.0–36.0)
MCV: 90.7 fL (ref 80.0–100.0)
PLATELETS: 626 10*3/uL — AB (ref 150–400)
RBC: 3.56 MIL/uL — AB (ref 4.22–5.81)
RDW: 15.4 % (ref 11.5–15.5)
WBC: 14.4 10*3/uL — ABNORMAL HIGH (ref 4.0–10.5)
nRBC: 0 % (ref 0.0–0.2)

## 2017-11-09 MED ORDER — ASPIRIN 300 MG RE SUPP
300.0000 mg | Freq: Every day | RECTAL | Status: DC
  Administered 2017-11-10 – 2017-11-15 (×6): 300 mg via RECTAL
  Filled 2017-11-09 (×7): qty 1

## 2017-11-09 MED ORDER — POTASSIUM CHLORIDE 10 MEQ/50ML IV SOLN
10.0000 meq | INTRAVENOUS | Status: AC
  Administered 2017-11-09: 10 meq via INTRAVENOUS
  Filled 2017-11-09 (×2): qty 50

## 2017-11-09 MED ORDER — THIAMINE HCL 100 MG/ML IJ SOLN
100.0000 mg | Freq: Every day | INTRAMUSCULAR | Status: DC
  Administered 2017-11-10 – 2017-11-15 (×6): 100 mg via INTRAVENOUS
  Filled 2017-11-09 (×6): qty 2

## 2017-11-09 MED ORDER — FAMOTIDINE IN NACL 20-0.9 MG/50ML-% IV SOLN
20.0000 mg | Freq: Two times a day (BID) | INTRAVENOUS | Status: DC
  Administered 2017-11-09 – 2017-11-15 (×11): 20 mg via INTRAVENOUS
  Filled 2017-11-09 (×14): qty 50

## 2017-11-09 MED ORDER — CLONIDINE HCL 0.2 MG/24HR TD PTWK
0.2000 mg | MEDICATED_PATCH | TRANSDERMAL | Status: DC
  Administered 2017-11-09: 0.2 mg via TRANSDERMAL
  Filled 2017-11-09: qty 1

## 2017-11-09 MED ORDER — POTASSIUM CHLORIDE 2 MEQ/ML IV SOLN
INTRAVENOUS | Status: DC
  Administered 2017-11-09 – 2017-11-10 (×2): via INTRAVENOUS
  Filled 2017-11-09 (×2): qty 1000

## 2017-11-09 NOTE — Progress Notes (Signed)
PT Cancellation Note  Patient Details Name: [name] MRN: [id number] DOB: 1945-02-02   Cancelled Treatment:     RN request PT hold for today, will cont to follow.   Reinaldo Berber, PT, DPT Acute Rehabilitation Services Pager: [phone] Office: [phone]     Reinaldo Berber 11/09/2017, 4:37 PM

## 2017-11-09 NOTE — Progress Notes (Signed)
Patient arrived to 6N06 from ICU via bed, with restraints and sitter. Report received from Dare. Family at bedside

## 2017-11-09 NOTE — Procedures (Signed)
Objective Swallowing Evaluation: Type of Study: FEES-Fiberoptic Endoscopic Evaluation of Swallow   Patient Details  Name: [name] MRN: [id number] Date of Birth: [birth year]  Today's Date: 11/09/2017 Time: SLP Start Time (ACUTE ONLY): 1116 -SLP Stop Time (ACUTE ONLY): 1144  SLP Time Calculation (min) (ACUTE ONLY): 28 min   Past Medical History:  Past Medical History:  Diagnosis Date  . Adenomatous polyp of colon 2006  . Alcohol-induced persisting dementia ([place])   . Anemia 2012  . Anxiety   . Arthritis    "left arm/shoulder" (05/17/2016)  . Atrial tachycardia, paroxysmal ([place]) 04/07/2015  . BPH (benign prostatic hypertrophy) 5/99  . Chronic bronchitis ([place])    "q year or q other year" (11/02/2011)  . Cirrhosis of liver ([place])   . DCM (dilated cardiomyopathy) ([place])    EF 45-50% by echo and cath 2017  . Depression    Hx of  . Falls frequently    "daily lately; legs just give out" (11/02/2011; 05/17/2016)  . GERD (gastroesophageal reflux disease) 07/31/04   gastritis   . H/O hiatal hernia   . Hard of hearing   . Heart murmur   . Hernia, umbilical    "unrepaired" (11/02/2011; 05/17/2016)  . History of blood transfusion 2012  . HLD (hyperlipidemia) 5/99  . HTN (hypertension) 5/99  . Mild CAD    10% RCA  . PAC (premature atrial contraction) 04/07/2015  . Peripheral vascular disease ([place])   . Pneumonia 1996   hosp  . PONV (postoperative nausea and vomiting)   . Seasonal allergies    pollen / ragweed  . Wears glasses    Past Surgical History:  Past Surgical History:  Procedure Laterality Date  . BACK SURGERY    . CARDIAC CATHETERIZATION N/A 05/23/2015   Procedure: Left Heart Cath and Coronary Angiography;  Surgeon: Troy Sine, MD;  Location: [facility];  Service: Cardiovascular;  Laterality: N/A;  . CATARACT EXTRACTION W/ INTRAOCULAR LENS  IMPLANT, BILATERAL Bilateral 10/2002  . COLONOSCOPY  07/31/04   multiple polyps; repeat in 3 years  . DUPUYTREN  CONTRACTURE RELEASE Left   . ETT myoview  03/09/07   nml EF 66%  . FRACTURE SURGERY    . HEMORRHOID SURGERY     "cauterized a long time ago" (11/02/2011)  . hosp CP R/O'D 2/24  03/08/07  . neck MRI  6/04   C5-6 disc abnormality  . ORIF ELBOW FRACTURE Left 10/27/2017   Procedure: OPEN REDUCTION INTERNAL FIXATION (ORIF) LEFT OLECRANON;  Surgeon: Leandrew Koyanagi, MD;  Location: [facility];  Service: Orthopedics;  Laterality: Left;  . ORIF HUMERUS FRACTURE  11/04/2011   Procedure: OPEN REDUCTION INTERNAL FIXATION (ORIF) PROXIMAL HUMERUS FRACTURE;  Surgeon: Rozanna Box, MD;  Location: [facility];  Service: Orthopedics;  Laterality: Left;  . POSTERIOR CERVICAL FUSION/FORAMINOTOMY N/A 03/21/2012   Procedure: POSTERIOR CERVICAL FUSION/FORAMINOTOMY LEVEL ONE;  Surgeon: Charlie Pitter, MD;  Location: [facility];  Service: Neurosurgery;  Laterality: N/A;  POSTERIOR CERVICAL ONE TO CERVICAL TWO FUSION WITH ILIAC CREST GRAFT AND LATERAL MASS SCREWS   . TONSILLECTOMY     "I was young" (11/02/2011)  . TOTAL HIP ARTHROPLASTY Left 10/24/2016   Procedure: TOTAL HIP ARTHROPLASTY ANTERIOR APPROACH;  Surgeon: Rod Can, MD;  Location: [facility];  Service: Orthopedics;  Laterality: Left;  . UPPER GASTROINTESTINAL ENDOSCOPY     HPI: Patient was admited for repair of left olecranon fracture. Post operative complications of ETOH withdrawl, agitation, VDRF (ETT 10/17-10/25 with  self-extubation and reintubation on 10/22). Pt with significant PMH of HH, GERD, PNA, PVD, mild CAD, HOH, falls, dialated cardiomyopathy, Atrial tachycarida, anemia, alcohol induced dementia, L THA s/p fall (10/2016), posterior cervical fusion, L ORIF humerus fx, back surgery.     Subjective: pt alert but confused, not consistently following commands    Assessment / Plan / Recommendation  CHL IP CLINICAL IMPRESSIONS 11/09/2017  Clinical Impression Pt presents with a moderate oropharyngeal dysphagia that is likely multifactorial given his prolonged  intubation (with self-extubation), generalized deconditioning, and altered mentation. Upon first visualizing his pharynx, he has diffuse, dried secretions present. Although he does not look significantly edematous, he does have bilateral irritation of the posterior portion of the true vocal folds, suggestive of where the ETT was present.   Pt did not have good bolus acceptance today, shaking his head and pursing his lips at most boluses offered. However, spoonfuls of nectar thick liquids appeared to spill passively and in a disorganized manner into his pharynx, entering the laryngeal vestibule before the swallow. Could not visualize material in the trachea after swallowing, but there was certainly penetration to the level of the vocal folds without pt making attempts at clearance.   Purees and nectar thick liquids are both swallowed with a delay in initiation, and diffuse pharyngeal weakness leads to moderate-severe residue throughout the valleculae, pyriform sinuses, and lateral channels. He does not consistently use strategies (attempted multiple swallows, coughs, effortful swallows, chin tuck) despite Max cues. Although pt does not initially appear to sense residue, he ultimately brings the majority of the residue back up into his oral cavity and expectorates it into his hands.   Recommend continued NPO status, except for few ice chips given one at a time after oral care to moisten his mucosa and utilize musculature. Prognosis for improvement is good, although will likely require time for reconditioning and mentation to clear.   SLP Visit Diagnosis Dysphagia, oropharyngeal phase (R13.12)  Attention and concentration deficit following --  Frontal lobe and executive function deficit following --  Impact on safety and function Moderate aspiration risk;Severe aspiration risk      CHL IP TREATMENT RECOMMENDATION 11/09/2017  Treatment Recommendations Therapy as outlined in treatment plan below      Prognosis 11/09/2017  Prognosis for Safe Diet Advancement Good  Barriers to Reach Goals Cognitive deficits  Barriers/Prognosis Comment --    CHL IP DIET RECOMMENDATION 11/09/2017  SLP Diet Recommendations NPO;Alternative means - temporary  Liquid Administration via --  Medication Administration Via alternative means  Compensations --  Postural Changes --      CHL IP OTHER RECOMMENDATIONS 11/09/2017  Recommended Consults --  Oral Care Recommendations Oral care QID  Other Recommendations Have oral suction available      CHL IP FOLLOW UP RECOMMENDATIONS 11/09/2017  Follow up Recommendations Skilled Nursing facility      [facility] IP FREQUENCY AND DURATION 11/09/2017  Speech Therapy Frequency (ACUTE ONLY) min 2x/week  Treatment Duration 2 weeks           CHL IP ORAL PHASE 11/09/2017  Oral Phase Impaired  Oral - Pudding Teaspoon --  Oral - Pudding Cup --  Oral - Honey Teaspoon --  Oral - Honey Cup --  Oral - Nectar Teaspoon Decreased bolus cohesion;Other (Comment)  Oral - Nectar Cup --  Oral - Nectar Straw --  Oral - Thin Teaspoon NT  Oral - Thin Cup --  Oral - Thin Straw --  Oral - Puree Delayed oral transit;Decreased bolus cohesion  Oral - Mech Soft --  Oral - Regular --  Oral - Multi-Consistency --  Oral - Pill --  Oral Phase - Comment --    CHL IP PHARYNGEAL PHASE 11/09/2017  Pharyngeal Phase Impaired  Pharyngeal- Pudding Teaspoon --  Pharyngeal --  Pharyngeal- Pudding Cup --  Pharyngeal --  Pharyngeal- Honey Teaspoon --  Pharyngeal --  Pharyngeal- Honey Cup --  Pharyngeal --  Pharyngeal- Nectar Teaspoon Delayed swallow initiation-pyriform sinuses;Reduced pharyngeal peristalsis;Reduced anterior laryngeal mobility;Reduced laryngeal elevation;Reduced airway/laryngeal closure;Reduced tongue base retraction;Pharyngeal residue - valleculae;Pharyngeal residue - pyriform;Lateral channel residue;Penetration/Aspiration before swallow  Pharyngeal Material enters airway,  CONTACTS cords and not ejected out  Pharyngeal- Nectar Cup --  Pharyngeal --  Pharyngeal- Nectar Straw --  Pharyngeal --  Pharyngeal- Thin Teaspoon --  Pharyngeal --  Pharyngeal- Thin Cup --  Pharyngeal --  Pharyngeal- Thin Straw --  Pharyngeal --  Pharyngeal- Puree Reduced pharyngeal peristalsis;Reduced anterior laryngeal mobility;Reduced laryngeal elevation;Reduced airway/laryngeal closure;Reduced tongue base retraction;Pharyngeal residue - valleculae;Pharyngeal residue - pyriform;Lateral channel residue;Delayed swallow initiation-vallecula  Pharyngeal --  Pharyngeal- Mechanical Soft --  Pharyngeal --  Pharyngeal- Regular --  Pharyngeal --  Pharyngeal- Multi-consistency --  Pharyngeal --  Pharyngeal- Pill --  Pharyngeal --  Pharyngeal Comment --     CHL IP CERVICAL ESOPHAGEAL PHASE 11/09/2017  Cervical Esophageal Phase WFL  Pudding Teaspoon --  Pudding Cup --  Honey Teaspoon --  Honey Cup --  Nectar Teaspoon --  Nectar Cup --  Nectar Straw --  Thin Teaspoon --  Thin Cup --  Thin Straw --  Puree --  Mechanical Soft --  Regular --  Multi-consistency --  Pill --  Cervical Esophageal Comment --     Germain Osgood 11/09/2017, 1:17 PM   Germain Osgood, M.A. [place] [facility] Environmental education officer [phone] Office [phone]

## 2017-11-09 NOTE — Progress Notes (Signed)
[name]  DUK:[id number] DOB: [birth year] DOA: 10/27/2017 PCP: Elby Beck, FNP    Brief Narrative:  21 male w/ a hx of EtOH abuse, PAD, CAD, HTN, Afib, and GERD who presented to [facility] on 10/17 for a left olecranon fracture.  Post op he was unable to be extubated by surgery, and was transferred to [facility].  Extubated 10/25 and returned to DNR status (was prior to surgery).  Significant Events: 10/17 admit for repair of left olecranon fracture 10/18 unable to extubate, admitted to [facility] 10/22 self-extubated, failed spontaneous breathing, reintubated 10/25 extubated, per wife's explanation of prior wishes made DNR 10/27 off cpap, alert and talking on [place]  Subjective: Patient is alert, confused and unable to provide any substantial history.  As per nursing at bedside, has been confused overnight, Ativan made him more confused but Haldol was better.  Was attempting to get out of bed.  Subsequently seen by ST, failed swallow and also felt unsafe for NG tube.  Assessment & Plan:  Acute hypoxic respiratory failure with hypoxemia Steadily improving - wean O2 as able.  Noted this morning at 95% on room air.  Chronic bronchiectasis with acute exacerbation with MRSA cont 3% saline nebs + vanc for 14 days of tx - will need high res CT after hospitalization per PCCM - no resp distress today.  No changes/stable.  Oropharyngeal dysphagia SLP input appreciated, FEES 10/30, high aspiration risk, recommend NPO and alternate means of medications and nutrition.  Will change as many meds as possible to IV or rectal.  Palliative care consulted for goals of care-determine if family would want PEG tube placed if he continues to fail swallow eval.  Acute pulmonary edema - Acute diastolic CHF exacerbation - Dilated cardiomyopathy No clinical evidence of signif volume overload at this time - w/ poor intake have stopped lasix for now - follow.  Actually appears on the dry side.  Started gentle IV fluids for  dehydration.  Monitor closely.  Dehydration with hypernatremia Likely related to poor oral intake and diuresis.  Gentle IV fluids and follow BMP in a.m.  Hypokalemia Replace and follow.  Acute metabolic encephalopathy/delirium - EtOH withdrawal Required precedex earlier in stay - cont w/ prn ativan and haldol for now -ongoing issues with confusion and intermittent agitation.  Anemia Hgb stable  Afib w/ RVR Controlled ventricular rate.  Continue IV metoprolol.  Left Olecranon Fracture s/p ORIF Per Ortho  DVT prophylaxis: lovenox  Code Status: DNR - NO CODE Family Communication: no family present at time of exam  Disposition Plan: To be determined  Consultants:  PCCM Ortho PMT  Antimicrobials:  Vanc 10/21 >   Cefazolin 10/17 >10/17  Objective:  Vitals:   11/09/17 1200 11/09/17 1300 11/09/17 1400 11/09/17 1500  BP: 115/71 (!) 127/105 (!) 121/97   Pulse: 77 85 95   Resp: (!) 22 (!) 29 16   Temp: 98.3 F (36.8 C)   98.6 F (37 C)  TempSrc: Oral   Oral  SpO2: 95% 98% 97%   Weight:      Height:        Intake/Output Summary (Last 24 hours) at 11/09/2017 1620 Last data filed at 11/09/2017 1200 Gross per 24 hour  Intake 407.53 ml  Output 475 ml  Net -67.47 ml   Filed Weights   11/03/17 0453 11/04/17 0500 11/07/17 0400  Weight: 85.3 kg 85.6 kg 79.2 kg    Examination: General: Elderly male, moderately built and nourished, lying comfortably propped up  in bed, being cleaned up by nursing. Lungs: Occasional basal crackles but otherwise clear to auscultation.  No increased work of breathing. Cardiovascular: S1 and S2 heard, RRR.  No JVD, murmurs or pedal edema.  Telemetry: Paroxysmal A. fib with controlled ventricular rate. Abdomen: Nondistended, soft and nontender.  No organomegaly or masses appreciated.  Normal bowel sounds heard. CNS: Alert and oriented only to self.  Follows simple instructions.  No focal neurological deficits. Extremities: No lower extremity  edema.  Moves all extremities well.  Left upper extremity limited movement secondary to postop splint.  CBC: Recent Labs  Lab 11/07/17 0435 11/08/17 0355 11/09/17 0342  WBC 11.6* 10.3 14.4*  HGB 9.4* 9.7* 9.9*  HCT 29.9* 31.8* 32.3*  MCV 89.0 90.6 90.7  PLT 651* 708* 545*   Basic Metabolic Panel: Recent Labs  Lab 11/03/17 0500  11/07/17 0435 11/08/17 0355 11/09/17 0342  NA 134*   < > 141 144 148*  K 3.0*   < > 3.7 3.9 3.3*  CL 101   < > 107 108 112*  CO2 23   < > _0 GLUCOSE 137*   < > 110* 117* 116*  BUN 27*   < > 21 26* 24*  CREATININE 0.80   < > 0.97 1.19 1.15  CALCIUM 8.4*   < > 9.1 9.1 9.3  MG 1.6*  --   --  2.0  --   PHOS 4.0  --   --  4.3  --    < > = values in this interval not displayed.   GFR: Estimated Creatinine Clearance: 57.2 mL/min (by C-G formula based on SCr of 1.15 mg/dL).  Liver Function Tests: Recent Labs  Lab 11/08/17 0355 11/09/17 0342  AST 68* 68*  ALT 72* 75*  ALKPHOS 288* 267*  BILITOT 1.3* 1.3*  PROT 7.4 7.1  ALBUMIN 3.1* 3.0*    HbA1C: Hgb A1c MFr Bld  Date/Time Value Ref Range Status  03/03/2012 12:00 PM 5.6 4.6 - 6.5 % Final    Comment:    Glycemic Control Guidelines for People with Diabetes:Non Diabetic:  <6%Goal of Therapy: <7%Additional Action Suggested:  >8%     CBG: Recent Labs  Lab 11/08/17 2350 11/09/17 0356 11/09/17 0752 11/09/17 1209 11/09/17 1541  GLUCAP 91 115* 94 90 112*    No results found for this or any previous visit (from the past 240 hour(s)).   Scheduled Meds: . aspirin  81 mg Oral Daily  . chlorhexidine gluconate (MEDLINE KIT)  15 mL Mouth Rinse BID  . Chlorhexidine Gluconate Cloth  6 each Topical Daily  . cloNIDine  0.2 mg Oral Q6H  . enoxaparin (LOVENOX) injection  40 mg Subcutaneous Q24H  . folic acid  1 mg Oral Daily  . guaiFENesin  5 mL Per Tube Q6H  . mouth rinse  15 mL Mouth Rinse 10 times per day  . metoprolol tartrate  10 mg Intravenous Q6H  . multivitamins with iron  1  tablet Oral Daily  . pantoprazole  40 mg Oral Daily  . QUEtiapine  100 mg Oral BID  . sodium chloride flush  10-40 mL Intracatheter Q12H  . thiamine  100 mg Oral Daily     LOS: 13 days   Sean Leep, MD, FACP, [facility]. [facility] Pager [phone]  If 7PM-7AM, please contact night-coverage [email or web address] Password [email or web address] 11/09/2017, 4:32 PM

## 2017-11-10 ENCOUNTER — Inpatient Hospital Stay (HOSPITAL_COMMUNITY): Payer: Medicare Other

## 2017-11-10 LAB — BASIC METABOLIC PANEL
Anion gap: 7 (ref 5–15)
BUN: 17 mg/dL (ref 8–23)
CO2: 23 mmol/L (ref 22–32)
Calcium: 8.9 mg/dL (ref 8.9–10.3)
Chloride: 114 mmol/L — ABNORMAL HIGH (ref 98–111)
Creatinine, Ser: 0.99 mg/dL (ref 0.61–1.24)
GFR calc Af Amer: >60 mL/min (ref >60)
GLUCOSE: 127 mg/dL — AB (ref 70–99)
POTASSIUM: 3.4 mmol/L — AB (ref 3.5–5.1)
SODIUM: 144 mmol/L (ref 135–145)

## 2017-11-10 LAB — CBC
HCT: 33.1 % — ABNORMAL LOW (ref 39.0–52.0)
Hemoglobin: 10.1 g/dL — ABNORMAL LOW (ref 13.0–17.0)
MCH: 27.7 pg (ref 26.0–34.0)
MCHC: 30.5 g/dL (ref 30.0–36.0)
MCV: 90.9 fL (ref 80.0–100.0)
PLATELETS: 582 10*3/uL — AB (ref 150–400)
RBC: 3.64 MIL/uL — AB (ref 4.22–5.81)
RDW: 15.5 % (ref 11.5–15.5)
WBC: 9.3 10*3/uL (ref 4.0–10.5)
nRBC: 0 % (ref 0.0–0.2)

## 2017-11-10 MED ORDER — POTASSIUM CHLORIDE 2 MEQ/ML IV SOLN
INTRAVENOUS | Status: AC
  Administered 2017-11-10 – 2017-11-11 (×2): via INTRAVENOUS
  Filled 2017-11-10 (×3): qty 1000

## 2017-11-10 MED ORDER — POTASSIUM CHLORIDE 10 MEQ/100ML IV SOLN
10.0000 meq | INTRAVENOUS | Status: AC
  Administered 2017-11-10 (×4): 10 meq via INTRAVENOUS
  Filled 2017-11-10 (×4): qty 100

## 2017-11-10 NOTE — Care Management Note (Signed)
Case Management Note  Patient Details  Name: [name] MRN: [id number] Date of Birth: 11-16-1945  Subjective/Objective:                    Action/Plan:  Palliative care consulted for goals of care-determine if family would want PEG tube placed if he continues to fail swallow eval.  Will continue to follow for discharge needs.  Expected Discharge Date:  10/27/17               Expected Discharge Plan:  Skilled Nursing Facility  In-House Referral:  Clinical Social Work  Discharge planning Services  CM Consult  Post Acute Care Choice:    Choice offered to:     DME Arranged:    DME Agency:     HH Arranged:    [place] Agency:     Status of Service:  In process, will continue to follow  If discussed at Long Length of Stay Meetings, dates discussed:    Additional Comments:  Marilu Favre, RN 11/10/2017, 11:42 AM

## 2017-11-10 NOTE — Progress Notes (Signed)
[name]  WCH:[id number] DOB: [birth year] DOA: 10/27/2017 PCP: Elby Beck, FNP    Brief Narrative:  38 male w/ a hx of EtOH abuse, PAD, CAD, HTN, Afib, and GERD who presented to [facility] on 10/17 for a left olecranon fracture.  Post op he was unable to be extubated by surgery, and was transferred to [facility].  Extubated 10/25 and returned to DNR status (was prior to surgery).  Significant Events: 10/17 admit for repair of left olecranon fracture 10/18 unable to extubate, admitted to [facility] 10/22 self-extubated, failed spontaneous breathing, reintubated 10/25 extubated, per wife's explanation of prior wishes made DNR 10/27 off cpap, alert and talking on [place]  Subjective: Patient was transferred from ICU to 6 N on 10/30.  Patient interviewed and examined along with his spouse, sister-in-law and brother-in-law at bedside.  Remains quite confused, intermittently slightly restless.  Does not appear in pain.  Speech is difficult to understand.  Does ask for water to drink. As per family, patient has been steadily declining physically and mentally over the last 2 years.  He drinks almost 2 bottles of wine daily and sometimes mixes with hard liquor.  He does not use a cane but is unsteady on his gait.  He has frequent confusions.  Assessment & Plan:  Acute hypoxic respiratory failure with hypoxemia Noted to be wheezing and mildly tachypneic this morning.  However saturating in the 90s on room air.  Chest x-ray without acute abnormalities.  Discussed with RN, PRN oxygen and albuterol nebulizations as needed.  Monitor closely.  Chronic bronchiectasis with acute exacerbation with MRSA cont 3% saline nebs + vanc for 14 days of tx - will need high res CT after hospitalization per PCCM.  No changes/stable.  Oropharyngeal dysphagia SLP input appreciated, FEES 10/30, high aspiration risk, recommend NPO and alternate means of medications and nutrition.  Changed as many meds as possible to IV or rectal.   Palliative care consulted for goals of care- discussed with palliative team, input pending.  Acute pulmonary edema - Acute diastolic CHF exacerbation - Dilated cardiomyopathy No clinical evidence of signif volume overload at this time - w/ poor intake have stopped lasix for now - follow.  Actually appears on the dry side.  Started gentle IV fluids for dehydration.  Does not appear volume overloaded.  Continue gentle IV fluids.  Dehydration with hypernatremia Likely related to poor oral intake and diuresis.  Gentle IV fluids and follow BMP in a.m. BMP better.  Hypokalemia Replace and follow.  Acute metabolic encephalopathy/delirium - EtOH withdrawal Required precedex earlier in stay - cont w/ prn ativan and haldol for now -ongoing issues with confusion and intermittent agitation.  Ongoing delirium.  Ammonia on 10/27/2016 was normal.  Recheck ammonia levels.  Anemia Hgb stable  Afib w/ RVR Controlled ventricular rate.  Continue IV metoprolol.  Left Olecranon Fracture s/p ORIF Per Ortho.  Nonweightbearing on left upper extremity.  Outpatient follow-up with orthopedics.  Adult failure to thrive Extensive discussion with patient's spouse, rest of family at bedside.  As per report, patient has been steadily declining both physically and mentally over the last 2 years.  They indicate that he would not have wished for any aggressive measures such as permanent PEG tube.  I explained to them that it is unclear as to when and if his mental status and dysphagia will improve enough to try oral diet.  Meanwhile he is not getting any substantial nutrition.  Overall his long-term prognosis is poor.  Palliative care consultation was discussed and they were agreeable.  I personally called palliative care team coordinator and MD for an early consultation.  Input pending.  DVT prophylaxis: lovenox  Code Status: DNR - NO CODE Family Communication: Discussed in detail with patient's spouse, sister-in-law  and brother-in-law at bedside. Disposition Plan: To be determined  Consultants:  PCCM Ortho PMT  Antimicrobials:  Vanc 10/21 >   Cefazolin 10/17 >10/17  Objective:  Vitals:   11/10/17 0153 11/10/17 0217 11/10/17 0451 11/10/17 1405  BP: 122/69  (!) 144/72 110/65  Pulse: 82 78 77 (!) 121  Resp: 17  16 (!) 22  Temp: 99.3 F (37.4 C)  98.4 F (36.9 C) 99.1 F (37.3 C)  TempSrc: Oral  Oral Oral  SpO2: 92% 98% 100% 100%  Weight:      Height:        Intake/Output Summary (Last 24 hours) at 11/10/2017 1655 Last data filed at 11/10/2017 1414 Gross per 24 hour  Intake 1266.81 ml  Output 325 ml  Net 941.81 ml   Filed Weights   11/03/17 0453 11/04/17 0500 11/07/17 0400  Weight: 85.3 kg 85.6 kg 79.2 kg    Examination: General: Elderly male, moderately built and nourished, generally ill looking, lying propped up in bed, fidgety at times. Lungs: Slightly harsh breath sounds bilaterally with scattered few expiratory rhonchi but no crackles.  Mildly tachypneic this morning. Cardiovascular: S1 and S2 heard, RRR.  No JVD, murmurs or pedal edema.  Telemetry personally reviewed: Paroxysmal A. fib/sinus rhythm. Abdomen: Nondistended, soft and nontender.  No organomegaly or masses appreciated.  Normal bowel sounds heard.  Stable. CNS: Alert and not oriented.  Unable to follow simple instructions.  Mumbles incomprehensibly.  No focal neurological deficits. Extremities: No lower extremity edema.  Moves all extremities well.  Left upper extremity limited movement secondary to postop splint.  CBC: Recent Labs  Lab 11/08/17 0355 11/09/17 0342 11/10/17 0427  WBC 10.3 14.4* 9.3  HGB 9.7* 9.9* 10.1*  HCT 31.8* 32.3* 33.1*  MCV 90.6 90.7 90.9  PLT 708* 626* 161*   Basic Metabolic Panel: Recent Labs  Lab 11/08/17 0355 11/09/17 0342 11/10/17 0427  NA 144 148* 144  K 3.9 3.3* 3.4*  CL 108 112* 114*  CO2 _0 GLUCOSE 117* 116* 127*  BUN 26* 24* 17  CREATININE 1.19 1.15 0.99    CALCIUM 9.1 9.3 8.9  MG 2.0  --   --   PHOS 4.3  --   --    GFR: Estimated Creatinine Clearance: 66.4 mL/min (by C-G formula based on SCr of 0.99 mg/dL).  Liver Function Tests: Recent Labs  Lab 11/08/17 0355 11/09/17 0342  AST 68* 68*  ALT 72* 75*  ALKPHOS 288* 267*  BILITOT 1.3* 1.3*  PROT 7.4 7.1  ALBUMIN 3.1* 3.0*    HbA1C: Hgb A1c MFr Bld  Date/Time Value Ref Range Status  03/03/2012 12:00 PM 5.6 4.6 - 6.5 % Final    Comment:    Glycemic Control Guidelines for People with Diabetes:Non Diabetic:  <6%Goal of Therapy: <7%Additional Action Suggested:  >8%     CBG: Recent Labs  Lab 11/08/17 2350 11/09/17 0356 11/09/17 0752 11/09/17 1209 11/09/17 1541  GLUCAP 91 115* 94 90 112*    No results found for this or any previous visit (from the past 240 hour(s)).   Scheduled Meds: . aspirin  300 mg Rectal Daily  . chlorhexidine gluconate (MEDLINE KIT)  15 mL Mouth Rinse BID  .  Chlorhexidine Gluconate Cloth  6 each Topical Daily  . cloNIDine  0.2 mg Transdermal Weekly  . enoxaparin (LOVENOX) injection  40 mg Subcutaneous Q24H  . mouth rinse  15 mL Mouth Rinse 10 times per day  . metoprolol tartrate  10 mg Intravenous Q6H  . sodium chloride flush  10-40 mL Intracatheter Q12H  . thiamine injection  100 mg Intravenous Daily     LOS: 14 days   Vernell Leep, MD, FACP, [facility]. [facility] Pager [phone]  If 7PM-7AM, please contact night-coverage [email or web address] Password [email or web address] 11/10/2017, 4:55 PM

## 2017-11-10 NOTE — Progress Notes (Signed)
Subjective: 14 Days Post-Op Procedure(s) (LRB): OPEN REDUCTION INTERNAL FIXATION (ORIF) LEFT OLECRANON (Left) Patient reports pain as mild.  Patient transferred to 6N yesterday evening.  He is alert, but still appears to be very confused.    Objective: Vital signs in last 24 hours: Temp:  [98.3 F (36.8 C)-102.7 F (39.3 C)] 98.4 F (36.9 C) (10/31 0451) Pulse Rate:  [77-104] 77 (10/31 0451) Resp:  [16-36] 16 (10/31 0451) BP: (93-145)/(57-105) 144/72 (10/31 0451) SpO2:  [92 %-100 %] 100 % (10/31 0451)  Intake/Output from previous day: 10/30 0701 - 10/31 0700 In: 1763.4 [I.V.:1670.9; IV Piggyback:92.5] Out: 300 [Urine:300] Intake/Output this shift: No intake/output data recorded.  Recent Labs    11/08/17 0355 11/09/17 0342 11/10/17 0427  HGB 9.7* 9.9* 10.1*   Recent Labs    11/09/17 0342 11/10/17 0427  WBC 14.4* 9.3  RBC 3.56* 3.64*  HCT 32.3* 33.1*  PLT 626* 582*   Recent Labs    11/09/17 0342 11/10/17 0427  NA 148* 144  K 3.3* 3.4*  CL 112* 114*  CO2 25 23  BUN 24* 17  CREATININE 1.15 0.99  GLUCOSE 116* 127*  CALCIUM 9.3 8.9   No results for input(s): LABPT, INR in the last 72 hours.  Neurovascular intact Sensation intact distally Intact pulses distally Incision: dressing C/D/I  LUE-splint in place.  Good cap refill.  No swelling to fingers or hand    Assessment/Plan: 14 Days Post-Op Procedure(s) (LRB): OPEN REDUCTION INTERNAL FIXATION (ORIF) LEFT OLECRANON (Left)  LUE-NWB> continue use of splint.   F/U w/ Dr. Erlinda Hong (ortho) once d/c      Aundra Dubin 11/10/2017, 7:38 AM

## 2017-11-10 NOTE — Progress Notes (Signed)
PMT RN Note: Consult order noted. PMT is experiencing very high consult volume and will be staffed with emergency coverage only on 11/1 due to the team being out of the office at the [facility].   If you need interim recommendations, please call [phone] to leave a message for the nurse. All medical emergencies should be directed to the attending physician.   Once there is an available provider (likely 11/2 or 11/3 due to weekend staffing), this patient will be evaluated and seen by our team.  Marguarite Markov G. Sorcha Rotunno, RN, BSN, CHPN Palliative Medicine Team 11/10/2017 1:10 PM Office [phone] 

## 2017-11-10 NOTE — Progress Notes (Signed)
  Speech Language Pathology Treatment: Dysphagia  Patient Details Name: [name] MRN: [id number] DOB: 13-Dec-1945 Today's Date: 11/10/2017 Time: 1164-3539 SLP Time Calculation (min) (ACUTE ONLY): 25 min  Assessment / Plan / Recommendation Clinical Impression  Pt continues to be delirious unable to participate in basic reasoning, though he is attentive to PO and accepted about 15 ice chips with delayed coughing observed. Many family members present for session, we discussed plan of care, current severity of dysphagia, plan to allow ice chips with family or staff every few hours to facilitate swallowing rehabilitation with relatively low risk in a pt who is otherwise incapable of participating. Pt to be reassessed via FEES Monday unless plan of care changes. May continue ice chips when upright and alert. Family in agreement with plan.    HPI HPI: Patient was admited for repair of left olecranon fracture. Post operative complications of ETOH withdrawl, agitation, VDRF (ETT 10/17-10/25 with self-extubation and reintubation on 10/22). Pt with significant PMH of HH, GERD, PNA, PVD, mild CAD, HOH, falls, dialated cardiomyopathy, Atrial tachycarida, anemia, alcohol induced dementia, L THA s/p fall (10/2016), posterior cervical fusion, L ORIF humerus fx, back surgery.        SLP Plan  Continue with current plan of care       Recommendations  Diet recommendations: NPO(ice chips ok) Medication Administration: Via alternative means                Oral Care Recommendations: Oral care QID Follow up Recommendations: Skilled Nursing facility SLP Visit Diagnosis: Dysphagia, oropharyngeal phase (R13.12) Plan: Continue with current plan of care       GO                Jason Hauge, Katherene Ponto 11/10/2017, 11:28 AM

## 2017-11-10 NOTE — Progress Notes (Signed)
Physical Therapy Treatment Patient Details Name: [name] MRN: [id number] DOB: 04/08/1945 Today's Date: 11/10/2017    History of Present Illness 72 y.o. male admitted on 10/27/17 for an elective repair of his L elbow olecranon fx that he sustained after a fall on 10/21/17.  Post operative complications of ETOH withdrawl, agitation, VDRF (inutbated during surgery and extubated 11/04/17 (self extubated on 11/01/17, but had to be re-intubated)).  Pt had to be placed on BiPAP post extubation, and as of 11/07/17 has progressed to nasa, cannula.  Pt with significant PMH of PVD, midl CAD, HOH, falls, dialated cardiomyopathy, Atrial tachycarida, anemia, alcohol induced dementia, L THA s/p fall (10/2016), posterior cervical fusion, L ORIF humerus fx, back surgery.      PT Comments    Patient more alert today. Able to answer some questions appropriately and state family member's name in the room. Tolerated standing bouts and side stepping along side bed with Mod A of 2 due to posterior lean and poor balance. Per wife, pt's cognition and arousal waxes and wanes when he is in the hospital. Pt with 3/4 DOE. HR ranged from 70-100s bpm. Needs cues to maintain NWB through LUE during session.Will continue to follow and progress as tolerated.    Follow Up Recommendations  SNF     Equipment Recommendations  None recommended by PT    Recommendations for Other Services       Precautions / Restrictions Precautions Precautions: Fall Precaution Comments: Per notes in chart, pt was sedentary and did limited walking PTA since his hip surgery in 10/2016. Restrictions Weight Bearing Restrictions: Yes LUE Weight Bearing: Non weight bearing    Mobility  Bed Mobility Overal bed mobility: Needs Assistance Bed Mobility: Supine to Sit     Supine to sit: Mod assist;+2 for physical assistance;HOB elevated Sit to supine: Mod assist;+2 for physical assistance   General bed mobility comments: Able to initiate  LEs to get to EOB, assist with trunk, scooting bottom and supporting LUE so pt does not push through.   Transfers Overall transfer level: Needs assistance Equipment used: 2 person hand held assist Transfers: Sit to/from Stand Sit to Stand: Mod assist;+2 physical assistance         General transfer comment: Assist of 2 to power to standing with posterior lean and LEs resting on rail, performed x2 from EOB.  Ambulation/Gait Ambulation/Gait assistance: Mod assist;+2 physical assistance Gait Distance (Feet): 5 Feet(x2) Assistive device: 2 person hand held assist Gait Pattern/deviations: Leaning posteriorly;Shuffle     General Gait Details: Able to side step along side bed with Mod A for balance due to heavy posterior lean, shuffling steps.   Stairs             Wheelchair Mobility    Modified Rankin (Stroke Patients Only)       Balance Overall balance assessment: Needs assistance Sitting-balance support: Feet supported;Single extremity supported Sitting balance-Leahy Scale: Good   Postural control: Posterior lean Standing balance support: During functional activity Standing balance-Leahy Scale: Poor Standing balance comment: Max A for standing balance due to heavy posterior lean.                            Cognition Arousal/Alertness: Awake/alert Behavior During Therapy: WFL for tasks assessed/performed Overall Cognitive Status: Impaired/Different from baseline Area of Impairment: Orientation;Memory;Following commands;Awareness;Problem solving;Safety/judgement                 Orientation Level: Disoriented to;Time;Situation  Memory: Decreased recall of precautions;Decreased short-term memory Following Commands: Follows one step commands inconsistently;Follows one step commands with increased time Safety/Judgement: Decreased awareness of safety;Decreased awareness of deficits Awareness: Intellectual Problem Solving: Slow processing;Decreased  initiation;Difficulty sequencing;Requires verbal cues;Requires tactile cues General Comments: Knows he is in the hospital, able to state sister in laws name in theroom with cues. Knows it is 2019 but not the month despite cues. More alert and conversive today.      Exercises General Exercises - Lower Extremity Long Arc Quad: Both;10 reps;Seated;AROM    General Comments General comments (skin integrity, edema, etc.): Family present during session. HR ranged from 70s-100s bpm.      Pertinent Vitals/Pain Pain Assessment: Faces Faces Pain Scale: No hurt    Home Living                      Prior Function            PT Goals (current goals can now be found in the care plan section) Progress towards PT goals: Progressing toward goals    Frequency    Min 3X/week      PT Plan Current plan remains appropriate    Co-evaluation              AM-PAC PT "6 Clicks" Daily Activity  Outcome Measure  Difficulty turning over in bed (including adjusting bedclothes, sheets and blankets)?: Unable Difficulty moving from lying on back to sitting on the side of the bed? : Unable Difficulty sitting down on and standing up from a chair with arms (e.g., wheelchair, bedside commode, etc,.)?: Unable Help needed moving to and from a bed to chair (including a wheelchair)?: A Lot Help needed walking in hospital room?: A Lot Help needed climbing 3-5 steps with a railing? : Total 6 Click Score: 8    End of Session Equipment Utilized During Treatment: Oxygen Activity Tolerance: Patient tolerated treatment well Patient left: in bed;with call bell/phone within reach;with bed alarm set;with nursing/sitter in room;with family/visitor present Nurse Communication: Mobility status PT Visit Diagnosis: Muscle weakness (generalized) (M62.81);Difficulty in walking, not elsewhere classified (R26.2);Pain Pain - Right/Left: Left Pain - part of body: Arm     Time: 1432-1455 PT Time Calculation  (min) (ACUTE ONLY): 23 min  Charges:  $Therapeutic Activity: 23-37 mins                     Wray Kearns, [place], DPT Acute Rehabilitation Services Pager [phone] Office [phone]       Marguarite Arbour A Sabra Heck 11/10/2017, 3:51 PM

## 2017-11-10 NOTE — Social Work (Signed)
CSW continuing to follow. Await determination of diet recommendations.  Westley Hummer, MSW, [employer] [phone]

## 2017-11-11 DIAGNOSIS — Z7189 Other specified counseling: Secondary | ICD-10-CM

## 2017-11-11 DIAGNOSIS — Z515 Encounter for palliative care: Secondary | ICD-10-CM

## 2017-11-11 DIAGNOSIS — G9340 Encephalopathy, unspecified: Secondary | ICD-10-CM

## 2017-11-11 LAB — COMPREHENSIVE METABOLIC PANEL
ALBUMIN: 2.8 g/dL — AB (ref 3.5–5.0)
ALT: 62 U/L — ABNORMAL HIGH (ref 0–44)
ANION GAP: 11 (ref 5–15)
AST: 46 U/L — AB (ref 15–41)
Alkaline Phosphatase: 217 U/L — ABNORMAL HIGH (ref 38–126)
BILIRUBIN TOTAL: 0.8 mg/dL (ref 0.3–1.2)
BUN: 14 mg/dL (ref 8–23)
CHLORIDE: 108 mmol/L (ref 98–111)
CO2: 23 mmol/L (ref 22–32)
Calcium: 8.9 mg/dL (ref 8.9–10.3)
Creatinine, Ser: 1 mg/dL (ref 0.61–1.24)
GFR calc Af Amer: >60 mL/min (ref >60)
GFR calc non Af Amer: >60 mL/min (ref >60)
GLUCOSE: 117 mg/dL — AB (ref 70–99)
POTASSIUM: 4.1 mmol/L (ref 3.5–5.1)
SODIUM: 142 mmol/L (ref 135–145)
TOTAL PROTEIN: 6.7 g/dL (ref 6.5–8.1)

## 2017-11-11 LAB — URINALYSIS, ROUTINE W REFLEX MICROSCOPIC
Bilirubin Urine: NEGATIVE
GLUCOSE, UA: NEGATIVE mg/dL
Hgb urine dipstick: NEGATIVE
Ketones, ur: NEGATIVE mg/dL
LEUKOCYTES UA: NEGATIVE
Nitrite: NEGATIVE
PROTEIN: NEGATIVE mg/dL
SPECIFIC GRAVITY, URINE: 1.016 (ref 1.005–1.030)
pH: 5 (ref 5.0–8.0)

## 2017-11-11 LAB — CBC
HCT: 32.2 % — ABNORMAL LOW (ref 39.0–52.0)
Hemoglobin: 10 g/dL — ABNORMAL LOW (ref 13.0–17.0)
MCH: 28 pg (ref 26.0–34.0)
MCHC: 31.1 g/dL (ref 30.0–36.0)
MCV: 90.2 fL (ref 80.0–100.0)
NRBC: 0 % (ref 0.0–0.2)
PLATELETS: 506 10*3/uL — AB (ref 150–400)
RBC: 3.57 MIL/uL — ABNORMAL LOW (ref 4.22–5.81)
RDW: 15.6 % — ABNORMAL HIGH (ref 11.5–15.5)
WBC: 9.8 10*3/uL (ref 4.0–10.5)

## 2017-11-11 LAB — AMMONIA: AMMONIA: 24 umol/L (ref 9–35)

## 2017-11-11 NOTE — Progress Notes (Addendum)
[name]  ZOX:[id number] DOB: Aug 11, 1945 DOA: 10/27/2017 PCP: Elby Beck, FNP    Brief Narrative:  80 male w/ a hx of EtOH abuse, PAD, CAD, HTN, Afib, and GERD who presented to [facility] on 10/17 for a left olecranon fracture.  Post op he was unable to be extubated by surgery, and was transferred to [facility].  Extubated 10/25 and returned to DNR status (was prior to surgery).  Significant Events: 10/17 admit for repair of left olecranon fracture 10/18 unable to extubate, admitted to [facility] 10/22 self-extubated, failed spontaneous breathing, reintubated 10/25 extubated, per wife's explanation of prior wishes made DNR 10/27 off cpap, alert and talking on [place]  Subjective: No significant change in mental status.  Ongoing confusion and intermittent agitation.  Sedated this morning from Ativan earlier, arousable but drifts back to sleep.  As per family at bedside, apparently coughed after ice chips yesterday.  Family also reports that patient at times has restlessness during urination.  Assessment & Plan:  Acute hypoxic respiratory failure with hypoxemia Respiratory status seems better today than yesterday but remains at high risk for decline.  Chronic bronchiectasis with acute exacerbation with MRSA cont 3% saline nebs + vanc for 14 days of tx - will need high res CT after hospitalization per PCCM.  No changes/stable.  Oropharyngeal dysphagia SLP input appreciated, FEES 10/30, high aspiration risk, recommend NPO and alternate means of medications and nutrition.  Changed as many meds as possible to IV or rectal.  Palliative care input appreciated, time-limited trial of current interventions over the next 2 to 3 days and try to reassess swallow eval on 11/4.  As per family, no PEG tube.  Acute pulmonary edema - Acute diastolic CHF exacerbation - Dilated cardiomyopathy No clinical evidence of signif volume overload at this time - w/ poor intake have stopped lasix for now - follow.  Actually  appears on the dry side.  Started gentle IV fluids for dehydration.  Clinically not volume overloaded.  Continue gentle IV fluids while he is n.p.o.  Dehydration with hypernatremia Likely related to poor oral intake and diuresis.  Gentle IV fluids and follow BMP in a.m. BMP better.  Improving.  Hypokalemia Replaced.  Acute metabolic encephalopathy/delirium - EtOH withdrawal Required precedex earlier in stay - cont w/ prn ativan and haldol for now -ongoing issues with confusion and intermittent agitation.  Ongoing delirium.  Ammonia on 10/27/2016 was normal.  Ammonia level normal.  No significant change in status.  Given history of difficulty urinating/??  Dysuria, check bladder scan for urinary retention but would be hesitant to place a Foley catheter unless absolutely necessary and check urine microscopy for UTI.  Anemia Hgb stable  Afib w/ RVR Controlled ventricular rate.  Continue IV metoprolol.  Left Olecranon Fracture s/p ORIF Per Ortho.  Nonweightbearing on left upper extremity.  Outpatient follow-up with orthopedics.  Adult failure to thrive Extensive discussion with patient's spouse, rest of family at bedside.  As per report, patient has been steadily declining both physically and mentally over the last 2 years.  They indicate that he would not have wished for any aggressive measures such as permanent PEG tube.  I explained to them that it is unclear as to when and if his mental status and dysphagia will improve enough to try oral diet.  Meanwhile he is not getting any substantial nutrition.  Overall his long-term prognosis is poor.  Palliative care consultation was discussed and they were agreeable.  Palliative care input appreciated.  They plan to reconvene with family on 11/4 to see if he makes any kind of improvement over the weekend.  DVT prophylaxis: lovenox  Code Status: DNR - NO CODE Family Communication: Discussed in detail with patient's spouse, sister-in-law and  brother-in-law at bedside. Disposition Plan: To be determined  Consultants:  PCCM Ortho PMT  Antimicrobials:  Vanc 10/21 >   Cefazolin 10/17 >10/17  Objective:  Vitals:   11/11/17 0558 11/11/17 1230 11/11/17 1300 11/11/17 1409  BP: 136/77   113/63  Pulse: 92   82  Resp:    (!) 26  Temp:   99.1 F (37.3 C) 99.1 F (37.3 C)  TempSrc:   Axillary Axillary  SpO2:  100%  100%  Weight:      Height:        Intake/Output Summary (Last 24 hours) at 11/11/2017 2006 Last data filed at 11/11/2017 1822 Gross per 24 hour  Intake 1334.43 ml  Output 725 ml  Net 609.43 ml   Filed Weights   11/03/17 0453 11/04/17 0500 11/07/17 0400  Weight: 85.3 kg 85.6 kg 79.2 kg    Examination: General: Elderly male, moderately built and nourished, generally ill looking, lying propped up in bed.  Does not appear in any discomfort or distress. Lungs: Improved breath sounds compared to yesterday, still harsh but no significant wheezing or rhonchi.  No crackles.  No increased work of breathing. Cardiovascular: S1 and S2 heard, RRR.  No JVD, murmurs or pedal edema.  Telemetry personally reviewed: Paroxysmal A. fib/sinus rhythm.  No change/stable. Abdomen: Nondistended, soft and nontender.  No organomegaly or masses appreciated.  Normal bowel sounds heard.  Stable. CNS: Mental status as indicated above.  Unable to follow simple instructions.  Mumbles incomprehensibly.  No focal neurological deficits. Extremities: No lower extremity edema.  Moves all extremities well.  Left upper extremity limited movement secondary to postop splint.  CBC: Recent Labs  Lab 11/09/17 0342 11/10/17 0427 11/11/17 0141  WBC 14.4* 9.3 9.8  HGB 9.9* 10.1* 10.0*  HCT 32.3* 33.1* 32.2*  MCV 90.7 90.9 90.2  PLT 626* 582* 211*   Basic Metabolic Panel: Recent Labs  Lab 11/08/17 0355 11/09/17 0342 11/10/17 0427 11/11/17 0141  NA 144 148* 144 142  K 3.9 3.3* 3.4* 4.1  CL 108 112* 114* 108  CO2 24 25 23 23   GLUCOSE  117* 116* 127* 117*  BUN 26* 24* 17 14  CREATININE 1.19 1.15 0.99 1.00  CALCIUM 9.1 9.3 8.9 8.9  MG 2.0  --   --   --   PHOS 4.3  --   --   --    GFR: Estimated Creatinine Clearance: 65.7 mL/min (by C-G formula based on SCr of 1 mg/dL).  Liver Function Tests: Recent Labs  Lab 11/08/17 0355 11/09/17 0342 11/11/17 0141  AST 68* 68* 46*  ALT 72* 75* 62*  ALKPHOS 288* 267* 217*  BILITOT 1.3* 1.3* 0.8  PROT 7.4 7.1 6.7  ALBUMIN 3.1* 3.0* 2.8*    HbA1C: Hgb A1c MFr Bld  Date/Time Value Ref Range Status  03/03/2012 12:00 PM 5.6 4.6 - 6.5 % Final    Comment:    Glycemic Control Guidelines for People with Diabetes:Non Diabetic:  <6%Goal of Therapy: <7%Additional Action Suggested:  >8%     CBG: Recent Labs  Lab 11/08/17 2350 11/09/17 0356 11/09/17 0752 11/09/17 1209 11/09/17 1541  GLUCAP 91 115* 94 90 112*    No results found for this or any previous visit (from the past  240 hour(s)).   Scheduled Meds: . aspirin  300 mg Rectal Daily  . chlorhexidine gluconate (MEDLINE KIT)  15 mL Mouth Rinse BID  . Chlorhexidine Gluconate Cloth  6 each Topical Daily  . cloNIDine  0.2 mg Transdermal Weekly  . enoxaparin (LOVENOX) injection  40 mg Subcutaneous Q24H  . mouth rinse  15 mL Mouth Rinse 10 times per day  . metoprolol tartrate  10 mg Intravenous Q6H  . sodium chloride flush  10-40 mL Intracatheter Q12H  . thiamine injection  100 mg Intravenous Daily     LOS: 15 days   Vernell Leep, MD, FACP, [facility] Pager [phone]  If 7PM-7AM, please contact night-coverage [email or web address] Password [email or web address] 11/11/2017, 8:06 PM

## 2017-11-11 NOTE — Progress Notes (Signed)
SLP Cancellation Note  Patient Details Name: [name] MRN: [id number] DOB: 1945-01-17   Cancelled treatment:       Reason Eval/Treat Not Completed: Medical issues which prohibited therapy. Pt quite tachypneic, effortful, not fully alert. Congested coughing at baseline. Discussed plan further with family. They are very realistic and aware of pts condition and risks. They will plan to continue to give ice chips if pt becomes more alert, but aware that it seems he is not tolerating well. Will f/u on Monday.    Teisha Trowbridge, Katherene Ponto 11/11/2017, 12:23 PM

## 2017-11-11 NOTE — Consult Note (Signed)
Consultation Note Date: 11/11/2017   Patient Name: [name]  DOB: 10-20-1945  MRN: [id number]  Age / Sex: 72 y.o., male  PCP: Elby Beck, FNP Referring Physician: Modena Jansky, MD  Reason for Consultation: Establishing goals of care  HPI/Patient Profile: 72 y.o. male admitted on 10/27/2017    Clinical Assessment and Goals of Care:  72 year old gentleman who has served in the [employer], lives at home with his wife, is originally from [place], has a chronic history of alcohol use, chronic history of being sedentary. Has underlying conditions of peripheral arterial disease coronary artery disease hypertension atrial fibrillation and gastroesophageal reflux disease. Patient was admitted on October 17 for left olecranon fracture, a status post ventilator dependent respiratory failure, self extubated previously in this hospitalization. Eventually wife discuss with pulmonary critical care medicine colleagues and elected do not re intubate, additionally DO NOT RESUSCITATE. Patient was transferred to medical floor and hospital medicine service.  Patient's hospital course is such that he has had ongoing encephalopathy and ongoing dysphagia. He remains weak and confused. Palliative care has been consultative for ongoing goals of care discussions.  I met with the patient's wife Burman Nieves as well as [name] sister and brother-in-law present at the bedside. I introduced myself and palliative care as follows:  Palliative medicine is specialized medical care for people living with serious illness. It focuses on providing relief from the symptoms and stress of a serious illness. The goal is to improve quality of life for both the patient and the family.  Brief life review performed. Goals wishes and values attempted to be elicited. Discussed extensively about current hospitalization. Discussed about next steps.  Concepts specific to artificial nutrition and hydration also discussed in detail. End-of-life signs and symptoms and declined trajectory from her liver disease standpoint also discussed in detail. Please note additional discussions and recommendations as listed below. Thank you for the consult.  NEXT OF KIN  wife [name] is at bedside Patient's sister in law and brother in law are also at the bedside Brief life review: Patient is originally from [place]. He has served in [employer]. He has a lifelong history of ETOH use, has had back and shoulder discomfort chronically, has 3 children. 2 children from a previous relationship live out of state.  SUMMARY OF RECOMMENDATIONS    1. Re discussed and confirmed DNR DNI status.   2. NO PEG tube. Artificial feeding tube would not be in line with the patient's previously expressed wishes. No role of NGT either, as the patient's wife states that he has pulled out every tube: ETT, Foleys and IVs etc.   3. Time limited trial of current interventions for the next 2-3 days. Patient's wife states that he's been having low grade temps, more cough since ice chips were initiated by SLP overnight, and she has also noticed him having more dysuria, hence she is asking whether further work up such as CXR or U/A is warranted and treatment for reversible conditions is appropriate at this time, will  defer to [facility] MD colleague Dr Algis Liming.   Patient's wife states that he participated some with both PT and SLP recently. Patient has rallied before, in previous hospitalizations. Wife and family do recognize very clearly that the patient is seriously ill this time, and that he has a high chance of ongoing decline.   4. We have preliminarily also discussed about hospice philosophy of care, more specifically, the type of care that is provided inside a residential hospice setting has also been discussed in detail. Wife will inform his 2 adult children from a previous relationship  who live out of state about his current condition.    5. Tentative family meeting planned for Monday 11-14-17, at that time, will proceed with full comfort care/residential hospice transfer if deemed appropriate, after this current time limited trial is over. Meanwhile, PMT will check in periodically with patient/family over the weekend.   Thank you for the consult.   Code Status/Advance Care Planning:  DNR    Symptom Management:    as above   Palliative Prophylaxis:   Delirium Protocol   Psycho-social/Spiritual:   Desire for further Chaplaincy support:yes  Additional Recommendations: Education on Hospice  Prognosis:   Guarded   Discharge Planning: To Be Determined  May need residential hospice if no significant or meaningful recovery signs noted in the next 2-3 days.     Primary Diagnoses: Present on Admission: . Fracture of left olecranon process, closed, initial encounter   I have reviewed the medical record, interviewed the patient and family, and examined the patient. The following aspects are pertinent.  Past Medical History:  Diagnosis Date  . Adenomatous polyp of colon 2006  . Alcohol-induced persisting dementia ([place])   . Anemia 2012  . Anxiety   . Arthritis    "left arm/shoulder" (05/17/2016)  . Atrial tachycardia, paroxysmal ([place]) 04/07/2015  . BPH (benign prostatic hypertrophy) 5/99  . Chronic bronchitis ([place])    "q year or q other year" (11/02/2011)  . Cirrhosis of liver ([place])   . DCM (dilated cardiomyopathy) ([place])    EF 45-50% by echo and cath 2017  . Depression    Hx of  . Falls frequently    "daily lately; legs just give out" (11/02/2011; 05/17/2016)  . GERD (gastroesophageal reflux disease) 07/31/04   gastritis   . H/O hiatal hernia   . Hard of hearing   . Heart murmur   . Hernia, umbilical    "unrepaired" (11/02/2011; 05/17/2016)  . History of blood transfusion 2012  . HLD (hyperlipidemia) 5/99  . HTN (hypertension) 5/99  . Mild CAD     10% RCA  . PAC (premature atrial contraction) 04/07/2015  . Peripheral vascular disease ([place])   . Pneumonia 1996   hosp  . PONV (postoperative nausea and vomiting)   . Seasonal allergies    pollen / ragweed  . Wears glasses    Social History   Socioeconomic History  . Marital status: Married    Spouse name: Not on file  . Number of children: 3  . Years of education: Not on file  . Highest education level: Not on file  Occupational History  . Occupation: Academic librarian: [employer]  Social Needs  . Financial resource strain: Not on file  . Food insecurity:    Worry: Not on file    Inability: Not on file  . Transportation needs:    Medical: Not on file    Non-medical: Not on file  Tobacco  Use  . Smoking status: Former Smoker    Types: Pipe  . Smokeless tobacco: Never Used  . Tobacco comment: 05/17/2016 "stopped in the late 1990s"  Substance and Sexual Activity  . Alcohol use: Yes    Alcohol/week: 28.0 standard drinks    Types: 28 Glasses of wine per week    Comment: last drink 10/22/17  . Drug use: No  . Sexual activity: Never  Lifestyle  . Physical activity:    Days per week: Not on file    Minutes per session: Not on file  . Stress: Not on file  Relationships  . Social connections:    Talks on phone: Not on file    Gets together: Not on file    Attends religious service: Not on file    Active member of club or organization: Not on file    Attends meetings of clubs or organizations: Not on file    Relationship status: Not on file  Other Topics Concern  . Not on file  Social History Narrative   Married (remarried), lives with wife; 3 children, 1 at home.    [employer] - mixes colors       Pt revised designated party release form Draven Natter, wife [phone] - can leave msg.     Allen Norris 03/04/10.        Would desire CPR.  Would not want prolonged life support if futile.   Family History  Problem Relation Age of Onset  .  Heart disease Father   . Diabetes Father   . Stroke Father   . Depression Mother   . Heart disease Brother   . Alcohol abuse Brother   . Liver disease Brother    Scheduled Meds: . aspirin  300 mg Rectal Daily  . chlorhexidine gluconate (MEDLINE KIT)  15 mL Mouth Rinse BID  . Chlorhexidine Gluconate Cloth  6 each Topical Daily  . cloNIDine  0.2 mg Transdermal Weekly  . enoxaparin (LOVENOX) injection  40 mg Subcutaneous Q24H  . mouth rinse  15 mL Mouth Rinse 10 times per day  . metoprolol tartrate  10 mg Intravenous Q6H  . sodium chloride flush  10-40 mL Intracatheter Q12H  . thiamine injection  100 mg Intravenous Daily   Continuous Infusions: . sodium chloride Stopped (11/10/17 0755)  . famotidine (PEPCID) IV Stopped (11/10/17 2239)   PRN Meds:.sodium chloride, albuterol, fentaNYL (SUBLIMAZE) injection, haloperidol lactate, LORazepam, sodium chloride flush, sodium chloride HYPERTONIC Medications Prior to Admission:  Prior to Admission medications   Medication Sig Start Date End Date Taking? Authorizing Provider  cetirizine (ZYRTEC) 10 MG tablet Take 1 tablet (10 mg total) by mouth daily. 10/03/17  Yes Elby Beck, FNP  Cyanocobalamin (VITAMIN B-12 PO) Take 1 tablet by mouth daily.   Yes [provider]  cyclobenzaprine (FLEXERIL) 10 MG tablet Take 0.5-1 tablets (5-10 mg total) by mouth at bedtime as needed for muscle spasms. 10/03/17  Yes Elby Beck, FNP  DULoxetine (CYMBALTA) 30 MG capsule Take 1 capsule (30 mg total) by mouth daily. 10/03/17  Yes Elby Beck, FNP  ferrous sulfate 325 (65 FE) MG tablet Take 325 mg by mouth daily as needed (low iron).    Yes [provider]  fluticasone (FLONASE) 50 MCG/ACT nasal spray Place 1 spray into both nostrils 2 (two) times daily as needed for allergies. 06/22/17  Yes Elby Beck, FNP  folic acid (FOLVITE) 1 MG tablet Take 1 tablet (1 mg total)  by mouth daily. 10/31/16  Yes Isaac Bliss, Rayford Halsted,  MD  gabapentin (NEURONTIN) 100 MG capsule TAKE 2 CAPSULES TWICE A DAY Patient taking differently: Take 200 mg by mouth 2 (two) times daily.  10/17/17  Yes Elby Beck, FNP  ibuprofen (ADVIL,MOTRIN) 400 MG tablet Take 1 tablet (400 mg total) by mouth every 6 (six) hours as needed for moderate pain (neck pain). 04/11/17  Yes Adhikari, Tamsen Meek, MD  KLOR-CON M15 15 MEQ tablet Take 15 mEq by mouth daily as needed (potasssium).  07/08/17  Yes [provider]  lidocaine (LIDODERM) 5 % Place 1 patch onto the skin daily. Remove & Discard patch within 12 hours or as directed by MD Patient taking differently: Place 1 patch onto the skin daily as needed (pain). Remove & Discard patch within 12 hours or as directed by MD 05/11/16  Yes Lucille Passy, MD  LORazepam (ATIVAN) 1 MG tablet Take 1 tablet (1 mg total) by mouth 2 (two) times daily as needed for anxiety. 10/26/17  Yes Elby Beck, FNP  magnesium oxide (MAG-OX) 400 (241.3 Mg) MG tablet Take 1 tablet (400 mg total) by mouth daily. 04/12/17  Yes Shelly Coss, MD  metoprolol succinate (TOPROL-XL) 25 MG 24 hr tablet Take 0.5 tablets (12.5 mg total) by mouth daily. 08/25/17  Yes Copland, Frederico Hamman, MD  Multiple Vitamin (MULTIVITAMIN WITH MINERALS) TABS tablet Take 1 tablet by mouth daily. 10/31/16  Yes Isaac Bliss, Rayford Halsted, MD  oxyCODONE (OXY IR/ROXICODONE) 5 MG immediate release tablet Take 1-2 tablets (5-10 mg total) by mouth 2 (two) times daily as needed. Patient taking differently: Take 5-10 mg by mouth 2 (two) times daily as needed for moderate pain.  10/26/17  Yes Leandrew Koyanagi, MD  pantoprazole (PROTONIX) 40 MG tablet Take 1 tablet (40 mg total) by mouth daily. 08/25/17  Yes Copland, Frederico Hamman, MD  promethazine (PHENERGAN) 25 MG tablet Take 25 mg by mouth every 6 (six) hours as needed for nausea or vomiting.   Yes [provider]  Thiamine HCl (VITAMIN B-1) 100 MG tablet Take 100 mg by mouth daily. Reported on 02/17/2015   Yes  [provider]  traZODone (DESYREL) 50 MG tablet Take 50 mg by mouth at bedtime.    Yes [provider]  methocarbamol (ROBAXIN) 750 MG tablet Take 1 tablet (750 mg total) by mouth 2 (two) times daily as needed for muscle spasms. 10/27/17   Leandrew Koyanagi, MD  ondansetron (ZOFRAN) 4 MG tablet Take 1-2 tablets (4-8 mg total) by mouth every 8 (eight) hours as needed for nausea or vomiting. 10/27/17   Leandrew Koyanagi, MD  oxyCODONE (OXY IR/ROXICODONE) 5 MG immediate release tablet Take 1-3 tablets (5-15 mg total) by mouth every 4 (four) hours as needed. 10/27/17   Leandrew Koyanagi, MD  potassium chloride SA (K-DUR,KLOR-CON) 20 MEQ tablet Take 1 tablet (20 mEq total) by mouth daily. Patient not taking: Reported on 10/27/2017 04/11/17 05/11/17  Shelly Coss, MD  promethazine (PHENERGAN) 25 MG tablet Take 1 tablet (25 mg total) by mouth every 6 (six) hours as needed for nausea. 10/27/17   Leandrew Koyanagi, MD  senna-docusate (SENOKOT S) 8.6-50 MG tablet Take 1 tablet by mouth at bedtime as needed. 10/27/17   Leandrew Koyanagi, MD   Allergies  Allergen Reactions  . Nifedipine Swelling    11/02/2011 pt denies this allergy (??)  . Bee Venom Swelling   Review of Systems Restless at times Non verbal to me  Physical Exam Ill-appearing gentleman, appears pale, appears mildly icteric as well, resting in bed Restless at times Coarse breath sounds, is tachypneic S1, S2 Abdomen is soft non distended Patient opens his eyes, attempts to gaze meaningfully towards his a loved ones present at the bedside. Does not verbalize. Does not follow commands. Is restless at times. Left upper extremity is in a splint. Patient moves extremities spontaneously. Lower extremities are somewhat cool to touch.  Vital Signs: BP 136/77 (BP Location: Left Leg)   Pulse 92   Temp 99.4 F (37.4 C) (Oral)   Resp 19   Ht 5' 5" (1.651 m)   Wt 79.2 kg   SpO2 99%   BMI 29.06 kg/m  Pain Scale: Faces POSS *See Group  Information*: 1-Acceptable,Awake and alert Pain Score: Asleep   SpO2: SpO2: 99 % O2 Device:SpO2: 99 % O2 Flow Rate: .O2 Flow Rate (L/min): 2 L/min  IO: Intake/output summary:   Intake/Output Summary (Last 24 hours) at 11/11/2017 1043 Last data filed at 11/11/2017 0900 Gross per 24 hour  Intake 1324.43 ml  Output 550 ml  Net 774.43 ml    LBM: Last BM Date: 11/05/17 Baseline Weight: Weight: 85.7 kg Most recent weight: Weight: 79.2 kg     Palliative Assessment/Data:   PPS 20%  Time In:  9 Time Out:10.10   Time Total:  70 min  Greater than 50%  of this time was spent counseling and coordinating care related to the above assessment and plan.  Signed by: Loistine Chance, MD  [phone]  Please contact Palliative Medicine Team phone at [phone] for questions and concerns.  For individual provider: See Shea Evans

## 2017-11-11 NOTE — Progress Notes (Signed)
Nutrition Follow-up  DOCUMENTATION CODES:   Obesity unspecified  INTERVENTION:   -RD will follow for diet advancement and supplement diet as appropriate; adjust care plan based upon goals of care  NUTRITION DIAGNOSIS:   Inadequate oral intake related to inability to eat as evidenced by NPO status.  Ongoing   GOAL:   Patient will meet greater than or equal to 90% of their needs  Unmet  MONITOR:   Diet advancement, PO intake, Labs  REASON FOR ASSESSMENT:   Ventilator, Consult Enteral/tube feeding initiation and management  ASSESSMENT:   72 yo male with PMH of HTN, HLD, BPH, GERD, PVD, hiatal hernia, CAD, and cirrhosis of the liver who was admitted on 10/17 for planned repair of left olecranon fracture (injured s/p fall at home on 10/10). Post-op patient was unable to be extubated.   10/25- self-extubated 10/30- s/p FEES- recommend continued NPO, transferred from ICU to floor 10/31- failed BSE, recommending FEES on Monday, 11/14/17  Reviewed I/O's: +774 ml x 24 hours and -2.2 L snce 10/28/17   Reviewed wt changes; noted a 12.1 % wt loss over the past week. Suspect fluid changes and NPO status are both contributing.   Palliative care team met with family this AM; goals of care reviewed. Pt and family do not desire PEG or NGT. Tentative plan for family meeting on 11/14/17, at which time pt will likely transition to full comfort care.   Case discussed with SLP, who confirms that pt has been NPO for the past 7 days. Pt is poor mental status, but been allowed ice chips every few hours to facilitate swallowing rehabilitation. SLP reports that if pt were to transition to oral diet will likely consume very little and will likely still fail FEES on Monday. SLP to re-evaluate pt and speak with pt family regarding wishes to transition to an oral diet for comfort.   Labs reviewed.   Diet Order:   Diet Order            Diet NPO time specified  Diet effective now               EDUCATION NEEDS:   No education needs have been identified at this time  Skin:  Skin Assessment: Skin Integrity Issues: Skin Integrity Issues:: Incisions Incisions: surgical incision to L arm  Last BM:  11/11/17  Height:   Ht Readings from Last 1 Encounters:  10/27/17 5' 5"  (1.651 m)    Weight:   Wt Readings from Last 1 Encounters:  11/07/17 79.2 kg    Ideal Body Weight:  61.8 kg  BMI:  Body mass index is 29.06 kg/m.  Estimated Nutritional Needs:   Kcal:  1800-2000  Protein:  85-100 gm  Fluid:  1.8-2 L    Meko Masterson A. Jimmye Norman, RD, LDN, CDE Pager: [phone] After hours Pager: [phone]

## 2017-11-12 MED ORDER — POTASSIUM CHLORIDE 2 MEQ/ML IV SOLN
INTRAVENOUS | Status: DC
  Administered 2017-11-12 – 2017-11-15 (×4): via INTRAVENOUS
  Filled 2017-11-12 (×7): qty 1000

## 2017-11-12 NOTE — Progress Notes (Signed)
[name]  YCX:[id number] DOB: November 13, 1945 DOA: 10/27/2017 PCP: Elby Beck, FNP    Brief Narrative:  52 male w/ a hx of EtOH abuse, PAD, CAD, HTN, Afib, and GERD who presented to [place] on 10/17 for a left olecranon fracture.  Post op he was unable to be extubated by surgery, and was transferred to [facility].  Extubated 10/25 and returned to DNR status (was prior to surgery).  Significant Events: 10/17 admit for repair of left olecranon fracture 10/18 unable to extubate, admitted to [facility] 10/22 self-extubated, failed spontaneous breathing, reintubated 10/25 extubated, per wife's explanation of prior wishes made DNR 10/27 off cpap, alert and talking on [place]  Subjective: Patient alert this morning but confused.  Remains in soft restraints.  Brother-in-law at bedside.  Assessment & Plan:  Acute hypoxic respiratory failure with hypoxemia Resolved again and back 100% on room air.  Chronic bronchiectasis with acute exacerbation with MRSA cont 3% saline nebs + completed IV vancomycin from 10/21-10/27- will need high res CT after hospitalization per PCCM.  No changes/stable.PCCM sign off note from 10/27 states MRSA treatment for total of 14 days but has not been on vancomycin after 10/27.  No fevers, leukocytosis, negative chest x-ray, thereby continue to monitor off of antibiotics.  Oropharyngeal dysphagia SLP input appreciated, FEES 10/30, high aspiration risk, recommend NPO and alternate means of medications and nutrition.  Changed as many meds as possible to IV or rectal.  Palliative care input appreciated, time-limited trial of current interventions over the next 2 to 3 days and try to reassess swallow eval on 11/4.  As per family, no PEG tube.  No change in status.  Acute pulmonary edema - Acute diastolic CHF exacerbation - Dilated cardiomyopathy No clinical evidence of signif volume overload at this time - w/ poor intake have stopped lasix for now - follow.  Actually appears on the dry  side.  Started gentle IV fluids for dehydration.  Clinically not volume overloaded.  Continue gentle IV fluids while he is n.p.o.  Dehydration with hypernatremia Likely related to poor oral intake and diuresis.  Gentle IV fluids and follow BMP in a.m. BMP better.  Improved  Hypokalemia Replaced.  Acute metabolic encephalopathy/delirium - EtOH withdrawal Required precedex earlier in stay - cont w/ prn ativan and haldol for now -ongoing issues with confusion and intermittent agitation.  Ongoing delirium.  Ammonia on 10/27/2016 was normal.  Ammonia level normal.  No significant change in status.  Given history of difficulty urinating/??  Dysuria, check bladder scan, no urinary retention and urine microscopy negative for features of UTI.  Apparently gets more agitated with Ativan, discontinued.  Minimize use of chemical restraints, frequent reorientation with family's assistance and delirium precautions.  Anemia Hgb stable  Afib w/ RVR Controlled ventricular rate.  Continue IV metoprolol.  Discontinue telemetry.  Left Olecranon Fracture s/p ORIF Per Ortho.  Nonweightbearing on left upper extremity.  Outpatient follow-up with orthopedics.  Adult failure to thrive Extensive discussion with patient's spouse, rest of family at bedside.  As per report, patient has been steadily declining both physically and mentally over the last 2 years.  They indicate that he would not have wished for any aggressive measures such as permanent PEG tube.  I explained to them that it is unclear as to when and if his mental status and dysphagia will improve enough to try oral diet.  Meanwhile he is not getting any substantial nutrition.  Overall his long-term prognosis is poor.  Palliative care  consultation was discussed and they were agreeable.  Palliative care input appreciated.  They plan to reconvene with family on 11/4 to see if he makes any kind of improvement over the weekend.  DVT prophylaxis: lovenox  Code  Status: DNR - NO CODE Family Communication: Discussed in detail with patient's  brother-in-law at bedside, updated care and answered questions. Disposition Plan: To be determined  Consultants:  PCCM Ortho PMT  Antimicrobials:  Vanc 10/21 >   Cefazolin 10/17 >10/17  Objective:  Vitals:   11/11/17 1409 11/11/17 2037 11/11/17 2056 11/12/17 0537  BP: 113/63 (!) 147/79  111/89  Pulse: 82 69  89  Resp: (!) 26 20  18   Temp: 99.1 F (37.3 C) 98.9 F (37.2 C)  97.7 F (36.5 C)  TempSrc: Axillary Oral  Oral  SpO2: 100% 100% 100% 100%  Weight:      Height:        Intake/Output Summary (Last 24 hours) at 11/12/2017 1401 Last data filed at 11/12/2017 8366 Gross per 24 hour  Intake 253.58 ml  Output 325 ml  Net -71.42 ml   Filed Weights   11/03/17 0453 11/04/17 0500 11/07/17 0400  Weight: 85.3 kg 85.6 kg 79.2 kg    Examination: General: Elderly male, moderately built and nourished, generally ill looking, lying propped up in bed.  Does not appear in any discomfort or distress.  Looks improved compared to last 2 days. Lungs: Much improved breath sounds.  Seem clear to auscultation.  No increased work of breathing. Cardiovascular: S1 and S2 heard, RRR.  No JVD, murmurs or pedal edema.  Telemetry personally reviewed: PAF, occasional mild ST in the 100s. Abdomen: Nondistended, soft and nontender.  No organomegaly or masses appreciated.  Normal bowel sounds heard.  Stable CNS: Alert but not oriented.  Does not follow instructions.  Not agitated this morning.  Smiles intermittently. Extremities: No lower extremity edema.  Moves all extremities well.  Left upper extremity limited movement secondary to postop splint.  Has mittens.  CBC: Recent Labs  Lab 11/09/17 0342 11/10/17 0427 11/11/17 0141  WBC 14.4* 9.3 9.8  HGB 9.9* 10.1* 10.0*  HCT 32.3* 33.1* 32.2*  MCV 90.7 90.9 90.2  PLT 626* 582* 294*   Basic Metabolic Panel: Recent Labs  Lab 11/08/17 0355 11/09/17 0342  11/10/17 0427 11/11/17 0141  NA 144 148* 144 142  K 3.9 3.3* 3.4* 4.1  CL 108 112* 114* 108  CO2 24 25 23 23   GLUCOSE 117* 116* 127* 117*  BUN 26* 24* 17 14  CREATININE 1.19 1.15 0.99 1.00  CALCIUM 9.1 9.3 8.9 8.9  MG 2.0  --   --   --   PHOS 4.3  --   --   --    GFR: Estimated Creatinine Clearance: 65.7 mL/min (by C-G formula based on SCr of 1 mg/dL).  Liver Function Tests: Recent Labs  Lab 11/08/17 0355 11/09/17 0342 11/11/17 0141  AST 68* 68* 46*  ALT 72* 75* 62*  ALKPHOS 288* 267* 217*  BILITOT 1.3* 1.3* 0.8  PROT 7.4 7.1 6.7  ALBUMIN 3.1* 3.0* 2.8*    HbA1C: Hgb A1c MFr Bld  Date/Time Value Ref Range Status  03/03/2012 12:00 PM 5.6 4.6 - 6.5 % Final    Comment:    Glycemic Control Guidelines for People with Diabetes:Non Diabetic:  <6%Goal of Therapy: <7%Additional Action Suggested:  >8%     CBG: Recent Labs  Lab 11/08/17 2350 11/09/17 0356 11/09/17 0752 11/09/17 1209 11/09/17 1541  GLUCAP 91 115* 94 90 112*    No results found for this or any previous visit (from the past 240 hour(s)).   Scheduled Meds: . aspirin  300 mg Rectal Daily  . chlorhexidine gluconate (MEDLINE KIT)  15 mL Mouth Rinse BID  . Chlorhexidine Gluconate Cloth  6 each Topical Daily  . cloNIDine  0.2 mg Transdermal Weekly  . enoxaparin (LOVENOX) injection  40 mg Subcutaneous Q24H  . mouth rinse  15 mL Mouth Rinse 10 times per day  . metoprolol tartrate  10 mg Intravenous Q6H  . sodium chloride flush  10-40 mL Intracatheter Q12H  . thiamine injection  100 mg Intravenous Daily     LOS: 16 days   Vernell Leep, MD, FACP, [facility]. [facility] Pager [phone]  If 7PM-7AM, please contact night-coverage [email or web address] Password [email or web address] 11/12/2017, 2:01 PM

## 2017-11-12 NOTE — Progress Notes (Signed)
Patient ID: [name], male   DOB: Mar 06, 1945, 72 y.o.   MRN: [id number] Patient is status post internal fixation for olecranon fracture left elbow.  Patient is restrained there are mittens in place the splint is intact.  Continue with immobilization.

## 2017-11-13 LAB — CBC
HCT: 36.4 % — ABNORMAL LOW (ref 39.0–52.0)
HEMOGLOBIN: 11.1 g/dL — AB (ref 13.0–17.0)
MCH: 26.9 pg (ref 26.0–34.0)
MCHC: 30.5 g/dL (ref 30.0–36.0)
MCV: 88.1 fL (ref 80.0–100.0)
Platelets: 484 10*3/uL — ABNORMAL HIGH (ref 150–400)
RBC: 4.13 MIL/uL — AB (ref 4.22–5.81)
RDW: 15.1 % (ref 11.5–15.5)
WBC: 11.1 10*3/uL — ABNORMAL HIGH (ref 4.0–10.5)
nRBC: 0 % (ref 0.0–0.2)

## 2017-11-13 LAB — COMPREHENSIVE METABOLIC PANEL
ALK PHOS: 211 U/L — AB (ref 38–126)
ALT: 57 U/L — AB (ref 0–44)
ANION GAP: 8 (ref 5–15)
AST: 43 U/L — ABNORMAL HIGH (ref 15–41)
Albumin: 3 g/dL — ABNORMAL LOW (ref 3.5–5.0)
BUN: 14 mg/dL (ref 8–23)
CALCIUM: 9.1 mg/dL (ref 8.9–10.3)
CO2: 22 mmol/L (ref 22–32)
CREATININE: 1.19 mg/dL (ref 0.61–1.24)
Chloride: 108 mmol/L (ref 98–111)
GFR calc non Af Amer: 60 mL/min — ABNORMAL LOW (ref >60)
Glucose, Bld: 102 mg/dL — ABNORMAL HIGH (ref 70–99)
Potassium: 3.6 mmol/L (ref 3.5–5.1)
Sodium: 138 mmol/L (ref 135–145)
TOTAL PROTEIN: 7.3 g/dL (ref 6.5–8.1)
Total Bilirubin: 1.3 mg/dL — ABNORMAL HIGH (ref 0.3–1.2)

## 2017-11-13 MED ORDER — RAMELTEON 8 MG PO TABS
8.0000 mg | ORAL_TABLET | Freq: Once | ORAL | Status: DC
  Filled 2017-11-13: qty 1

## 2017-11-13 MED ORDER — ACETAMINOPHEN 650 MG RE SUPP
650.0000 mg | Freq: Four times a day (QID) | RECTAL | Status: DC | PRN
  Administered 2017-11-13: 650 mg via RECTAL
  Filled 2017-11-13: qty 1

## 2017-11-13 NOTE — Progress Notes (Signed)
Daily Progress Note   Patient Name: [name]       Date: 11/13/2017 DOB: October 20, 1945  Age: 72 y.o. MRN#: [id number] Attending Physician: Modena Jansky, MD Primary Care Physician: Elby Beck, FNP Admit Date: 10/27/2017  Reason for Consultation/Follow-up: Establishing goals of care  Subjective:  awake some what more alert Eyes open Attempts to verbalize, but has mumbled and incoherent words Unable to express himself Brother in law at bedside See below   Length of Stay: 17  Current Medications: Scheduled Meds:  . aspirin  300 mg Rectal Daily  . chlorhexidine gluconate (MEDLINE KIT)  15 mL Mouth Rinse BID  . Chlorhexidine Gluconate Cloth  6 each Topical Daily  . cloNIDine  0.2 mg Transdermal Weekly  . enoxaparin (LOVENOX) injection  40 mg Subcutaneous Q24H  . mouth rinse  15 mL Mouth Rinse 10 times per day  . metoprolol tartrate  10 mg Intravenous Q6H  . sodium chloride flush  10-40 mL Intracatheter Q12H  . thiamine injection  100 mg Intravenous Daily    Continuous Infusions: . sodium chloride 250 mL (11/11/17 2019)  . dextrose 5 % 1,000 mL with potassium chloride 40 mEq infusion 50 mL/hr at 11/13/17 1049  . famotidine (PEPCID) IV 20 mg (11/13/17 1048)    PRN Meds: sodium chloride, acetaminophen, albuterol, fentaNYL (SUBLIMAZE) injection, haloperidol lactate, sodium chloride flush, sodium chloride HYPERTONIC  Physical Exam         Weak appearing gentleman somewhat more alert than when seen at the time of initial consultation on 11-11-17 Appears weak, appears chronically ill Left upper extremity in a dressing/ splint. Patient is wearing mittens in both upper extremities hands. Shallow but clear breath sounds at least in the anterior lung fields S1, S2 Abdomen  is not distended Patient has pulled out his condom catheter that is due to be reinserted again, no abdominal distention or tenderness noted No lower extremity edema  Vital Signs: BP (!) (P) 153/133 (BP Location: Right Leg)   Pulse (!) 125   Temp 100.1 F (37.8 C) (Axillary)   Resp (!) 21   Ht _0  (1.651 m)   Wt 79.2 kg   SpO2 98%   BMI 29.06 kg/m  SpO2: SpO2: 98 % O2 Device: O2 Device: Room Air O2 Flow Rate: O2 Flow Rate (L/min):  2 L/min  Intake/output summary:   Intake/Output Summary (Last 24 hours) at 11/13/2017 1107 Last data filed at 11/13/2017 1040 Gross per 24 hour  Intake 1137.02 ml  Output 600 ml  Net 537.02 ml   LBM: Last BM Date: 11/11/17 Baseline Weight: Weight: 85.7 kg Most recent weight: Weight: 79.2 kg       Palliative Assessment/Data:      Patient Active Problem List   Diagnosis Date Noted  . Encephalopathy   . Palliative care by specialist   . Goals of care, counseling/discussion   . Endotracheally intubated   . History of ETT   . Acute respiratory failure with hypoxemia ([place])   . Acute respiratory insufficiency   . Closed fracture of left olecranon process 10/27/2017  . Fracture of left olecranon process, closed, initial encounter 10/27/2017  . Acute streptococcal pharyngitis 09/06/2017  . Frequent falls 06/29/2017  . Fall 04/09/2017  . Scalp laceration   . Hypomagnesemia 10/25/2016  . Hip fracture ([place]) 10/24/2016  . Displaced fracture of left femoral neck ([place]) 10/24/2016  . Thrombocytopenia ([place]) 10/24/2016  . Closed displaced fracture of left femoral neck ([place]) 10/23/2016  . Contracture of muscle of left hand 09/15/2016  . Encounter for nasogastric tube placement 07/08/2016  . Infected laceration 07/08/2016  . Generalized muscle weakness 07/08/2016  . Anemia 05/18/2016  . Syncope and collapse   . Syncope 05/17/2016  . Cellulitis 05/17/2016  . Rib fractures 05/17/2016  . Bullous pemphigoid 05/17/2016  . ETOH abuse 03/17/2016  .  Dizziness and giddiness 12/09/2015  . Chronic pain syndrome 12/09/2015  . Arthritis 11/20/2015  . Mild CAD   . DCM (dilated cardiomyopathy) ([place])   . Abnormal nuclear stress test 05/07/2015  . PAC (premature atrial contraction) 04/07/2015  . Atrial tachycardia, paroxysmal ([place]) 03/10/2015  . Depression 04/12/2012  . C2 cervical fracture ([place]) 02/29/2012  . Alcohol withdrawal ([place]) 11/06/2011  . Fracture of humerus, proximal, left, closed 11/02/2011  . Hyponatremia 11/17/2010  . H/O: upper GI bleed 10/20/2010  . Stomach ulcer from aspirin/ibuprofen-like drugs (NSAID's) 10/20/2010  . Colon polyp 08/26/2010  . Diverticulosis of colon (without mention of hemorrhage) 08/26/2010  . BPH (benign prostatic hyperplasia) 07/02/2010  . Deficiency anemia 03/09/2010  . Hyposmolality and/or hyponatremia 10/17/2009  . ALCOHOLIC CIRRHOSIS OF LIVER 07/25/2009  . Hypokalemia 04/03/2007  . Anxiety state 01/24/2007  . HLD (hyperlipidemia) 07/28/2006  . Alcohol abuse 07/28/2006  . Essential hypertension 07/28/2006  . IMPOTENCE, ORGANIC ORIGIN 07/28/2006   PPS 20%  Palliative Care Assessment & Plan   Patient Profile:    Assessment:  72 year old gentleman with alcohol use, prolonged hospitalization left colic current fracture status post repair, ventilator dependent respiratory failure episode in this hospitalization, encephalopathy multifactorial in etiology, Acute hypoxic respiratory failure, possible component of aspiration, nothing by mouth, ongoing decline.  Palliative care consult for overall goals of care discussions, discussions pertaining to artificial nutrition and hydration options, appropriate disposition options.  Recommendations/Plan:   family meeting:  Discussed with patient's brother-in-law present at the bedside. Patient's wife and her sister are currently touring several different facilities. We have been discussing about the patient requiring residential hospice if he is  consistently not able to participate with speech therapist and be cleared for a soft diet. Even if he is, there remains a high risk for aspiration. Even if he is, it remains to be seen whether he will be able to take oral nutrition enough to sustain himself. Should the patient have a short rally, he will  need skilled nursing facility attempt before needing full scope of hospice services. Family is very clear about the patient's overall condition.  Plan is for speech therapy to perform repeat evaluation Monday 11-14-17, patient may possibly also undergo modified barium swallow, await results to further guide appropriate goals of care and disposition discussions. It has been established at that the patient would not want to have a temporary or semipermanent or permanent mode of artificial nutrition, no NG tube, no PEG tube. The patient has pulled out his condom catheter this morning the patient has self extubated previously in this hospitalization and has a self discontinued several tubes and lines as so far. Remains confused with episodic agitation going on. Palliative care to continue to follow along, monitor disease trajectory and hospital course and help guide appropriate decision-making.   Code Status:    Code Status Orders  (From admission, onward)         Start     Ordered   11/04/17 1534  Do not attempt resuscitation (DNR)  Continuous    Question Answer Comment  Maintain current active treatments Yes   Do not initiate new interventions Yes      11/04/17 1534        Code Status History    Date Active Date Inactive Code Status Order ID Comments User Context   11/01/2017 1609 11/04/2017 1534 Full Code [id number]  Magdalen Spatz, NP Inpatient   04/07/2017 1253 04/11/2017 1753 DNR [id number]  Georgette Shell, MD ED   10/24/2016 0310 10/31/2016 1747 DNR [id number]  Toy Baker, MD ED   10/24/2016 0229 10/24/2016 0310 Full Code [id number]  Toy Baker, MD ED   05/17/2016 1216  05/22/2016 1709 Full Code [id number]  Radene Gunning, NP ED   03/21/2012 1707 03/28/2012 2003 Full Code [id number]  Charlie Pitter, MD Inpatient   11/02/2011 1644 11/16/2011 2203 DNR [id number]  Cristi Loron, RN Inpatient       Prognosis:   Unable to determine  Discharge Planning:  To Be Determined  Care plan was discussed with  Brother in law at bedside  Thank you for allowing the Palliative Medicine Team to assist in the care of this patient.   Time In: 10 Time Out: 10.25 Total Time 25 Prolonged Time Billed  no       Greater than 50%  of this time was spent counseling and coordinating care related to the above assessment and plan.  Loistine Chance, MD [phone]  Please contact Palliative Medicine Team phone at [phone] for questions and concerns.

## 2017-11-13 NOTE — Progress Notes (Signed)
Patient is still distressed and not following verbal commands. Patient trying to sleep but is very anxious.

## 2017-11-13 NOTE — Progress Notes (Signed)
Patient [name] to verbal commands and becoming very distressed.

## 2017-11-13 NOTE — Progress Notes (Signed)
[name]  SHF:[id number] DOB: 07-13-1945 DOA: 10/27/2017 PCP: Elby Beck, FNP    Brief Narrative:  56 male w/ a hx of EtOH abuse, PAD, CAD, HTN, Afib, and GERD who presented to [facility] on 10/17 for a left olecranon fracture.  Post op he was unable to be extubated by surgery, and was transferred to [facility].  Extubated 10/25 and returned to DNR status (was prior to surgery).  Significant Events: 10/17 admit for repair of left olecranon fracture 10/18 unable to extubate, admitted to [facility] 10/22 self-extubated, failed spontaneous breathing, reintubated 10/25 extubated, per wife's explanation of prior wishes made DNR 10/27 off cpap, alert and talking on [place]  Subjective: Patient has been alert for the last 2 days however confused and unable to provide any history.  Mumbles incomprehensively.  As per family at bedside and RN,.  Self restlessness and agitation.  Has mittens and a Air cabin crew at bedside.  Has not required much Haldol.  Assessment & Plan:  Acute hypoxic respiratory failure with hypoxemia Resolved again and back 100% on room air.  Chronic bronchiectasis with acute exacerbation with MRSA cont 3% saline nebs + completed IV vancomycin from 10/21-10/27- will need high res CT after hospitalization per PCCM.  No changes/stable.PCCM sign off note from 10/27 states MRSA treatment for total of 14 days but has not been on vancomycin after 10/27.  No fevers, leukocytosis, negative chest x-ray, thereby continue to monitor off of antibiotics.  Oropharyngeal dysphagia SLP input appreciated, FEES 10/30, high aspiration risk, recommend NPO and alternate means of medications and nutrition.  Changed as many meds as possible to IV or rectal.  Palliative care input appreciated, time-limited trial of current interventions over the next 2 to 3 days and try to reassess swallow eval on 11/4.  As per family, no PEG tube.  No change in status.  Hopefully can undergo ST evaluation 11/4.  Acute  pulmonary edema - Acute diastolic CHF exacerbation - Dilated cardiomyopathy No clinical evidence of signif volume overload at this time - w/ poor intake have stopped lasix for now - follow.  Actually appears on the dry side.  Started gentle IV fluids for dehydration.  Does not appear clinically volume overloaded.  Continue gentle IV fluids while he is n.p.o.  Dehydration with hypernatremia Likely related to poor oral intake and diuresis.  Gentle IV fluids and follow BMP in a.m. BMP better.  Improved  Hypokalemia Replaced.  Acute metabolic encephalopathy/delirium - EtOH withdrawal Required precedex earlier in stay - cont w/ prn ativan and haldol for now -ongoing issues with confusion and intermittent agitation.  Ongoing delirium.  Ammonia on 10/27/2016 was normal.  Ammonia level normal.  No significant change in status.  Given history of difficulty urinating/??  Dysuria, check bladder scan, no urinary retention and urine microscopy negative for features of UTI.  Apparently gets more agitated with Ativan, discontinued.  Minimize use of chemical restraints, frequent reorientation with family's assistance and delirium precautions.  Mental status is somewhat better compared to 48 hours ago, in the sense that he is more alert, not with severe agitation and using less Haldol.  Anemia Hgb stable  Afib w/ RVR Controlled ventricular rate.  Continue IV metoprolol.  Discontinue telemetry.  Left Olecranon Fracture s/p ORIF Per Ortho.  Nonweightbearing on left upper extremity.  Outpatient follow-up with orthopedics.  Adult failure to thrive Extensive discussion with patient's spouse, rest of family at bedside.  As per report, patient has been steadily declining both physically and  mentally over the last 2 years.  They indicate that he would not have wished for any aggressive measures such as permanent PEG tube.  I explained to them that it is unclear as to when and if his mental status and dysphagia will  improve enough to try oral diet.  Meanwhile he is not getting any substantial nutrition.  Overall his long-term prognosis is poor.  Palliative care consultation was discussed and they were agreeable.  Palliative care follow-up appreciated and will plan to see again after swallow eval.  They plan to reconvene with family on 11/4 to see if he makes any kind of improvement over the weekend.  DVT prophylaxis: lovenox  Code Status: DNR - NO CODE Family Communication: I discussed in detail with patient's brother-in-law at bedside, updated care and answered questions. Disposition Plan: To be determined  Consultants:  PCCM Ortho PMT  Antimicrobials:  Vanc 10/21 >   Cefazolin 10/17 >10/17  Objective:  Vitals:   11/13/17 0000 11/13/17 0441 11/13/17 0534 11/13/17 0748  BP: (!) 113/91 (!) 113/53  (!) (P) 153/133  Pulse: 93 90  (!) 125  Resp: 20   (!) 21  Temp: 98.7 F (37.1 C) 98.5 F (36.9 C) 100.1 F (37.8 C) 100.1 F (37.8 C)  TempSrc: Oral Oral Axillary Axillary  SpO2: 98% 100%  98%  Weight:      Height:        Intake/Output Summary (Last 24 hours) at 11/13/2017 1240 Last data filed at 11/13/2017 1040 Gross per 24 hour  Intake 1137.02 ml  Output 600 ml  Net 537.02 ml   Filed Weights   11/03/17 0453 11/04/17 0500 11/07/17 0400  Weight: 85.3 kg 85.6 kg 79.2 kg    Examination: No significant change in clinical exam since yesterday. General: Elderly male, moderately built and nourished, generally ill looking, lying propped up in bed.  Does not appear in any discomfort or distress.  Looks improved compared to last 2 days. Lungs: Much improved breath sounds.  Seem clear to auscultation.  No increased work of breathing. Cardiovascular: S1 and S2 heard, RRR.  No JVD, murmurs or pedal edema.  Abdomen: Nondistended, soft and nontender.  No organomegaly or masses appreciated.  Normal bowel sounds heard.  Stable CNS: Alert but not oriented.  Does not follow instructions.  Not agitated  this morning.  Smiles intermittently. Extremities: No lower extremity edema.  Moves all extremities well.  Left upper extremity limited movement secondary to postop splint.  Has mittens.  CBC: Recent Labs  Lab 11/10/17 0427 11/11/17 0141 11/13/17 0355  WBC 9.3 9.8 11.1*  HGB 10.1* 10.0* 11.1*  HCT 33.1* 32.2* 36.4*  MCV 90.9 90.2 88.1  PLT 582* 506* 440*   Basic Metabolic Panel: Recent Labs  Lab 11/08/17 0355  11/10/17 0427 11/11/17 0141 11/13/17 0355  NA 144   < > 144 142 138  K 3.9   < > 3.4* 4.1 3.6  CL 108   < > 114* 108 108  CO2 24   < > 23 23 22   GLUCOSE 117*   < > 127* 117* 102*  BUN 26*   < > 17 14 14   CREATININE 1.19   < > 0.99 1.00 1.19  CALCIUM 9.1   < > 8.9 8.9 9.1  MG 2.0  --   --   --   --   PHOS 4.3  --   --   --   --    < > = values in  this interval not displayed.   GFR: Estimated Creatinine Clearance: 55.2 mL/min (by C-G formula based on SCr of 1.19 mg/dL).  Liver Function Tests: Recent Labs  Lab 11/08/17 0355 11/09/17 0342 11/11/17 0141 11/13/17 0355  AST 68* 68* 46* 43*  ALT 72* 75* 62* 57*  ALKPHOS 288* 267* 217* 211*  BILITOT 1.3* 1.3* 0.8 1.3*  PROT 7.4 7.1 6.7 7.3  ALBUMIN 3.1* 3.0* 2.8* 3.0*    HbA1C: Hgb A1c MFr Bld  Date/Time Value Ref Range Status  03/03/2012 12:00 PM 5.6 4.6 - 6.5 % Final    Comment:    Glycemic Control Guidelines for People with Diabetes:Non Diabetic:  <6%Goal of Therapy: <7%Additional Action Suggested:  >8%     CBG: Recent Labs  Lab 11/08/17 2350 11/09/17 0356 11/09/17 0752 11/09/17 1209 11/09/17 1541  GLUCAP 91 115* 94 90 112*    No results found for this or any previous visit (from the past 240 hour(s)).   Scheduled Meds: . aspirin  300 mg Rectal Daily  . chlorhexidine gluconate (MEDLINE KIT)  15 mL Mouth Rinse BID  . Chlorhexidine Gluconate Cloth  6 each Topical Daily  . cloNIDine  0.2 mg Transdermal Weekly  . enoxaparin (LOVENOX) injection  40 mg Subcutaneous Q24H  . mouth rinse  15 mL  Mouth Rinse 10 times per day  . metoprolol tartrate  10 mg Intravenous Q6H  . sodium chloride flush  10-40 mL Intracatheter Q12H  . thiamine injection  100 mg Intravenous Daily     LOS: 17 days   Vernell Leep, MD, FACP, [facility]. [facility] Pager [phone]  If 7PM-7AM, please contact night-coverage [email or web address] Password [email or web address] 11/13/2017, 12:40 PM

## 2017-11-14 ENCOUNTER — Telehealth: Payer: Self-pay

## 2017-11-14 DIAGNOSIS — R1310 Dysphagia, unspecified: Secondary | ICD-10-CM

## 2017-11-14 DIAGNOSIS — G92 Toxic encephalopathy: Secondary | ICD-10-CM

## 2017-11-14 DIAGNOSIS — R627 Adult failure to thrive: Secondary | ICD-10-CM

## 2017-11-14 MED ORDER — MELATONIN 3 MG PO TABS
3.0000 mg | ORAL_TABLET | Freq: Once | ORAL | Status: AC
  Administered 2017-11-14: 3 mg via ORAL
  Filled 2017-11-14: qty 1

## 2017-11-14 NOTE — Progress Notes (Signed)
Nutrition Follow-up  DOCUMENTATION CODES:   Obesity unspecified  INTERVENTION:   -Magic Cup TID with meals, each supplement provides 290 kcals and 9 grams protein  NUTRITION DIAGNOSIS:   Inadequate oral intake related to inability to eat as evidenced by NPO status.  Progressing; just advanced to dysphagia 2 diet with thin liquids s/p FEES today  GOAL:   Patient will meet greater than or equal to 90% of their needs  Progressing  MONITOR:   Diet advancement, PO intake, Labs  REASON FOR ASSESSMENT:   Ventilator, Consult Enteral/tube feeding initiation and management  ASSESSMENT:   72 yo male with PMH of HTN, HLD, BPH, GERD, PVD, hiatal hernia, CAD, and cirrhosis of the liver who was admitted on 10/17 for planned repair of left olecranon fracture (injured s/p fall at home on 10/10). Post-op patient was unable to be extubated.   10/25- self-extubated 10/30- s/p FEES- recommend continued NPO, transferred from ICU to floor 10/31- failed BSE, recommending FEES on Monday, 11/14/17 11/4- s/p FEES- advanced to dysphagia 2 diet with thin liquids  Reviewed I/O's: +180 ml x 24 hours and -3 L since 10/31/17  Case discussed with RN, who reports that pt just went FEES and diet was advanced to dysphagia 2 diet. Per RN, mental status is waxing and waning; pt was very calm earlier this AM, however, more agitated the last time RN visited room. She confirms plan for palliative care meeting later on today to firm up goals of care (likely to SNF or comfort/hopsice).   Pt/family does not desire NGT or PEG placement.   Labs reviewed.   Diet Order:   Diet Order            DIET DYS 2 Room service appropriate? Yes; Fluid consistency: Thin  Diet effective now              EDUCATION NEEDS:   No education needs have been identified at this time  Skin:  Skin Assessment: Skin Integrity Issues: Skin Integrity Issues:: Incisions Incisions: surgical incision to L arm  Last BM:   11/13/17  Height:   Ht Readings from Last 1 Encounters:  10/27/17 5\' 5"  (1.651 m)    Weight:   Wt Readings from Last 1 Encounters:  11/07/17 79.2 kg    Ideal Body Weight:  61.8 kg  BMI:  Body mass index is 29.06 kg/m.  Estimated Nutritional Needs:   Kcal:  1800-2000  Protein:  85-100 gm  Fluid:  1.8-2 L    Miara Emminger A. Jimmye Norman, RD, LDN, CDE Pager: [phone] After hours Pager: [phone]

## 2017-11-14 NOTE — NC FL2 (Signed)
[place] MEDICAID FL2 LEVEL OF CARE SCREENING TOOL     IDENTIFICATION  Patient Name: [name] Birthdate: Apr 12, 1945 Sex: male Admission Date (Current Location): 10/27/2017  [facility] and [place] Number:  Herbalist and Address:  [facility], [place] [address] [place], [place] [address]      Provider Number: [id number]  Attending Physician Name and Address:  Modena Jansky, MD  Relative Name and Phone Number:  Leron Stoffers; wife; [phone]    Current Level of Care: Hospital Recommended Level of Care: [place] Prior Approval Number:    Date Approved/Denied:   PASRR Number: [id number]   Discharge Plan: SNF    Current Diagnoses: Patient Active Problem List   Diagnosis Date Noted  . Encephalopathy   . Palliative care by specialist   . Goals of care, counseling/discussion   . Endotracheally intubated   . History of ETT   . Acute respiratory failure with hypoxemia ([place])   . Acute respiratory insufficiency   . Closed fracture of left olecranon process 10/27/2017  . Fracture of left olecranon process, closed, initial encounter 10/27/2017  . Acute streptococcal pharyngitis 09/06/2017  . Frequent falls 06/29/2017  . Fall 04/09/2017  . Scalp laceration   . Hypomagnesemia 10/25/2016  . Hip fracture ([place]) 10/24/2016  . Displaced fracture of left femoral neck ([place]) 10/24/2016  . Thrombocytopenia ([place]) 10/24/2016  . Closed displaced fracture of left femoral neck ([place]) 10/23/2016  . Contracture of muscle of left hand 09/15/2016  . Encounter for nasogastric tube placement 07/08/2016  . Infected laceration 07/08/2016  . Generalized muscle weakness 07/08/2016  . Anemia 05/18/2016  . Syncope and collapse   . Syncope 05/17/2016  . Cellulitis 05/17/2016  . Rib fractures 05/17/2016  . Bullous pemphigoid 05/17/2016  . ETOH abuse 03/17/2016  . Dizziness and giddiness 12/09/2015  . Chronic pain syndrome 12/09/2015  .  Arthritis 11/20/2015  . Mild CAD   . DCM (dilated cardiomyopathy) ([place])   . Abnormal nuclear stress test 05/07/2015  . PAC (premature atrial contraction) 04/07/2015  . Atrial tachycardia, paroxysmal ([place]) 03/10/2015  . Depression 04/12/2012  . C2 cervical fracture ([place]) 02/29/2012  . Alcohol withdrawal ([place]) 11/06/2011  . Fracture of humerus, proximal, left, closed 11/02/2011  . Hyponatremia 11/17/2010  . H/O: upper GI bleed 10/20/2010  . Stomach ulcer from aspirin/ibuprofen-like drugs (NSAID's) 10/20/2010  . Colon polyp 08/26/2010  . Diverticulosis of colon (without mention of hemorrhage) 08/26/2010  . BPH (benign prostatic hyperplasia) 07/02/2010  . Deficiency anemia 03/09/2010  . Hyposmolality and/or hyponatremia 10/17/2009  . ALCOHOLIC CIRRHOSIS OF LIVER 07/25/2009  . Hypokalemia 04/03/2007  . Anxiety state 01/24/2007  . HLD (hyperlipidemia) 07/28/2006  . Alcohol abuse 07/28/2006  . Essential hypertension 07/28/2006  . IMPOTENCE, ORGANIC ORIGIN 07/28/2006    Orientation RESPIRATION BLADDER Height & Weight     Self  O2(2L nasal canula) Incontinent, External catheter Weight: 174 lb 9.7 oz (79.2 kg) Height:  _0  (165.1 cm)  BEHAVIORAL SYMPTOMS/MOOD NEUROLOGICAL BOWEL NUTRITION STATUS      Incontinent Diet (see discharge summary)  AMBULATORY STATUS COMMUNICATION OF NEEDS Skin   Extensive Assist Verbally Surgical wounds(incision on left arm)                       Personal Care Assistance Level of Assistance  Bathing, Feeding, Dressing Bathing Assistance: Maximum assistance Feeding assistance: Limited assistance Dressing Assistance: Maximum assistance     Functional Limitations Info  Sight,  Hearing, Speech Sight Info: Adequate Hearing Info: Adequate Speech Info: Impaired (Slurred)    SPECIAL CARE FACTORS FREQUENCY  PT (By licensed PT), OT (By licensed OT); SLP (By licensed SLP)     PT Frequency: 5x week OT Frequency: 5x week  SLP Frequency: 2x week           Contractures Contractures Info: Not present    Additional Factors Info  Code Status, Allergies, Isolation Precautions Code Status Info: DNR Allergies Info: NIFEDIPINE, BEE VENOM      Isolation Precautions Info: Contact Precautions MRSA     Current Medications (11/14/2017):  This is the current hospital active medication list Current Facility-Administered Medications  Medication Dose Route Frequency Provider Last Rate Last Dose  . 0.9 %  sodium chloride infusion   Intravenous PRN Cherene Altes, MD 10 mL/hr at 11/11/17 2019 250 mL at 11/11/17 2019  . acetaminophen (TYLENOL) suppository 650 mg  650 mg Rectal Q6H PRN Vertis Kelch, NP   650 mg at 11/13/17 6468  . albuterol (PROVENTIL) (2.5 MG/3ML) 0.083% nebulizer solution 2.5 mg  2.5 mg Nebulization Q2H PRN Cherene Altes, MD   2.5 mg at 11/11/17 2056  . aspirin suppository 300 mg  300 mg Rectal Daily Modena Jansky, MD   300 mg at 11/14/17 1129  . chlorhexidine gluconate (MEDLINE KIT) (PERIDEX) 0.12 % solution 15 mL  15 mL Mouth Rinse BID Ollis, Brandi L, NP   15 mL at 11/13/17 0900  . Chlorhexidine Gluconate Cloth 2 % PADS 6 each  6 each Topical Daily Leandrew Koyanagi, MD   6 each at 11/14/17 1129  . cloNIDine (CATAPRES - Dosed in mg/24 hr) patch 0.2 mg  0.2 mg Transdermal Weekly Modena Jansky, MD   0.2 mg at 11/09/17 1735  . dextrose 5 % 1,000 mL with potassium chloride 40 mEq infusion   Intravenous Continuous Modena Jansky, MD 50 mL/hr at 11/14/17 0737    . enoxaparin (LOVENOX) injection 40 mg  40 mg Subcutaneous Q24H Agarwala, Einar Grad, MD   40 mg at 11/14/17 1128  . famotidine (PEPCID) IVPB 20 mg premix  20 mg Intravenous Q12H Vernell Leep D, MD 100 mL/hr at 11/14/17 1130 20 mg at 11/14/17 1130  . fentaNYL (SUBLIMAZE) injection 12.5 mcg  12.5 mcg Intravenous Q2H PRN Joette Catching T, MD   12.5 mcg at 11/11/17 2037  . haloperidol lactate (HALDOL) injection 2 mg  2 mg Intravenous Q4H PRN Cherene Altes,  MD   2 mg at 11/12/17 0135  . MEDLINE mouth rinse  15 mL Mouth Rinse 10 times per day Noe Gens L, NP   15 mL at 11/14/17 1129  . metoprolol tartrate (LOPRESSOR) injection 10 mg  10 mg Intravenous Q6H Cherene Altes, MD   10 mg at 11/14/17 1128  . ramelteon (ROZEREM) tablet 8 mg  8 mg Oral Once Bodenheimer, Charles A, NP      . sodium chloride flush (NS) 0.9 % injection 10-40 mL  10-40 mL Intracatheter Q12H Leandrew Koyanagi, MD   10 mL at 11/13/17 1051  . sodium chloride flush (NS) 0.9 % injection 10-40 mL  10-40 mL Intracatheter PRN Leandrew Koyanagi, MD   10 mL at 11/01/17 1035  . sodium chloride HYPERTONIC 3 % nebulizer solution 4 mL  4 mL Nebulization PRN Simonne Maffucci B, MD      . thiamine (B-1) injection 100 mg  100 mg Intravenous Daily Hongalgi, Lenis Dickinson, MD  100 mg at 11/14/17 1128     Discharge Medications: Please see discharge summary for a list of discharge medications.  Relevant Imaging Results:  Relevant Lab Results:   Additional Information SS[id number] [place], [name]

## 2017-11-14 NOTE — Procedures (Signed)
Objective Swallowing Evaluation: Type of Study: FEES-Fiberoptic Endoscopic Evaluation of Swallow   Patient Details  Name: [name] MRN: [id number] Date of Birth: [birth year]  Today's Date: 11/14/2017 Time: SLP Start Time (ACUTE ONLY): 0945 -SLP Stop Time (ACUTE ONLY): 1015  SLP Time Calculation (min) (ACUTE ONLY): 30 min   Past Medical History:  Past Medical History:  Diagnosis Date  . Adenomatous polyp of colon 2006  . Alcohol-induced persisting dementia ([place])   . Anemia 2012  . Anxiety   . Arthritis    "left arm/shoulder" (05/17/2016)  . Atrial tachycardia, paroxysmal ([place]) 04/07/2015  . BPH (benign prostatic hypertrophy) 5/99  . Chronic bronchitis ([place])    "q year or q other year" (11/02/2011)  . Cirrhosis of liver ([place])   . DCM (dilated cardiomyopathy) ([place])    EF 45-50% by echo and cath 2017  . Depression    Hx of  . Falls frequently    "daily lately; legs just give out" (11/02/2011; 05/17/2016)  . GERD (gastroesophageal reflux disease) 07/31/04   gastritis   . H/O hiatal hernia   . Hard of hearing   . Heart murmur   . Hernia, umbilical    "unrepaired" (11/02/2011; 05/17/2016)  . History of blood transfusion 2012  . HLD (hyperlipidemia) 5/99  . HTN (hypertension) 5/99  . Mild CAD    10% RCA  . PAC (premature atrial contraction) 04/07/2015  . Peripheral vascular disease ([place])   . Pneumonia 1996   hosp  . PONV (postoperative nausea and vomiting)   . Seasonal allergies    pollen / ragweed  . Wears glasses    Past Surgical History:  Past Surgical History:  Procedure Laterality Date  . BACK SURGERY    . CARDIAC CATHETERIZATION N/A 05/23/2015   Procedure: Left Heart Cath and Coronary Angiography;  Surgeon: Troy Sine, MD;  Location: [facility];  Service: Cardiovascular;  Laterality: N/A;  . CATARACT EXTRACTION W/ INTRAOCULAR LENS  IMPLANT, BILATERAL Bilateral 10/2002  . COLONOSCOPY  07/31/04   multiple polyps; repeat in 3 years  . DUPUYTREN  CONTRACTURE RELEASE Left   . ETT myoview  03/09/07   nml EF 66%  . FRACTURE SURGERY    . HEMORRHOID SURGERY     "cauterized a long time ago" (11/02/2011)  . hosp CP R/O'D 2/24  03/08/07  . neck MRI  6/04   C5-6 disc abnormality  . ORIF ELBOW FRACTURE Left 10/27/2017   Procedure: OPEN REDUCTION INTERNAL FIXATION (ORIF) LEFT OLECRANON;  Surgeon: Leandrew Koyanagi, MD;  Location: [facility];  Service: Orthopedics;  Laterality: Left;  . ORIF HUMERUS FRACTURE  11/04/2011   Procedure: OPEN REDUCTION INTERNAL FIXATION (ORIF) PROXIMAL HUMERUS FRACTURE;  Surgeon: Rozanna Box, MD;  Location: [facility];  Service: Orthopedics;  Laterality: Left;  . POSTERIOR CERVICAL FUSION/FORAMINOTOMY N/A 03/21/2012   Procedure: POSTERIOR CERVICAL FUSION/FORAMINOTOMY LEVEL ONE;  Surgeon: Charlie Pitter, MD;  Location: [facility];  Service: Neurosurgery;  Laterality: N/A;  POSTERIOR CERVICAL ONE TO CERVICAL TWO FUSION WITH ILIAC CREST GRAFT AND LATERAL MASS SCREWS   . TONSILLECTOMY     "I was young" (11/02/2011)  . TOTAL HIP ARTHROPLASTY Left 10/24/2016   Procedure: TOTAL HIP ARTHROPLASTY ANTERIOR APPROACH;  Surgeon: Rod Can, MD;  Location: [facility];  Service: Orthopedics;  Laterality: Left;  . UPPER GASTROINTESTINAL ENDOSCOPY     HPI: Patient was admited for repair of left olecranon fracture. Post operative complications of ETOH withdrawl, agitation, VDRF (ETT 10/17-10/25 with  self-extubation and reintubation on 10/22). Pt with significant PMH of HH, GERD, PNA, PVD, mild CAD, HOH, falls, dialated cardiomyopathy, Atrial tachycarida, anemia, alcohol induced dementia, L THA s/p fall (10/2016), posterior cervical fusion, L ORIF humerus fx, back surgery.     Subjective: pt alert but confused, not consistently following commands    Assessment / Plan / Recommendation  CHL IP CLINICAL IMPRESSIONS 11/14/2017  Clinical Impression Pt demonstrates dramatic improvement in swallow function since last assessed. Pts pharyngeal tissue  now quite healthy, pt still confused, but alert and aware of PO with min verbal cues. Airway protection with thin liquids timely and strong with only one instance of trace sensed penetration to the cords with initial ice chip trial. Though no further penetration or aspiration occurred even with consecutive straw sips, pt did cough and expectorate thin, clear foamy secretions throughout exam. Mild vallecular residuals present post swallow with solids; suspect due to imcomplete oral formation of bolus with tail of bolus remaining on base of tongue post swallow. Recommend pt initiate a dys 2 (finely chopped) diet with thin liquids with full supervision with PO. May be able to upgrade as attention and awareness improve. Discussed with multiple providers.   SLP Visit Diagnosis Dysphagia, oropharyngeal phase (R13.12)  Attention and concentration deficit following --  Frontal lobe and executive function deficit following --  Impact on safety and function Mild aspiration risk      CHL IP TREATMENT RECOMMENDATION 11/14/2017  Treatment Recommendations Therapy as outlined in treatment plan below     Prognosis 11/14/2017  Prognosis for Safe Diet Advancement Good  Barriers to Reach Goals Cognitive deficits  Barriers/Prognosis Comment --    CHL IP DIET RECOMMENDATION 11/14/2017  SLP Diet Recommendations Dysphagia 2 (Fine chop) solids;Thin liquid  Liquid Administration via Cup;Straw  Medication Administration Via alternative means  Compensations Small sips/bites;Slow rate;Minimize environmental distractions  Postural Changes Seated upright at 90 degrees;Remain semi-upright after after feeds/meals (Comment)      CHL IP OTHER RECOMMENDATIONS 11/14/2017  Recommended Consults --  Oral Care Recommendations Oral care BID  Other Recommendations Have oral suction available      CHL IP FOLLOW UP RECOMMENDATIONS 11/14/2017  Follow up Recommendations Skilled Nursing facility      [facility] IP FREQUENCY AND DURATION  11/14/2017  Speech Therapy Frequency (ACUTE ONLY) min 2x/week  Treatment Duration 2 weeks           CHL IP ORAL PHASE 11/14/2017  Oral Phase Impaired  Oral - Pudding Teaspoon --  Oral - Pudding Cup --  Oral - Honey Teaspoon --  Oral - Honey Cup --  Oral - Nectar Teaspoon WFL  Oral - Nectar Cup WFL  Oral - Nectar Straw WFL  Oral - Thin Teaspoon WFL  Oral - Thin Cup WFL  Oral - Thin Straw WFL  Oral - Puree WFL  Oral - Mech Soft --  Oral - Regular Decreased bolus cohesion  Oral - Multi-Consistency --  Oral - Pill --  Oral Phase - Comment --    CHL IP PHARYNGEAL PHASE 11/14/2017  Pharyngeal Phase Impaired  Pharyngeal- Pudding Teaspoon --  Pharyngeal --  Pharyngeal- Pudding Cup --  Pharyngeal --  Pharyngeal- Honey Teaspoon --  Pharyngeal --  Pharyngeal- Honey Cup --  Pharyngeal --  Pharyngeal- Nectar Teaspoon Penetration/Apiration after swallow;Lateral channel residue  Pharyngeal Material enters airway, CONTACTS cords and then ejected out;Material does not enter airway  Pharyngeal- Nectar Cup WFL  Pharyngeal --  Pharyngeal- Nectar Straw WFL  Pharyngeal --  Pharyngeal- Thin Teaspoon --  Pharyngeal --  Pharyngeal- Thin Cup [facility]  Pharyngeal --  Pharyngeal- Thin Straw WFL  Pharyngeal --  Pharyngeal- Puree Pharyngeal residue - valleculae  Pharyngeal --  Pharyngeal- Mechanical Soft Pharyngeal residue - valleculae  Pharyngeal --  Pharyngeal- Regular --  Pharyngeal --  Pharyngeal- Multi-consistency --  Pharyngeal --  Pharyngeal- Pill --  Pharyngeal --  Pharyngeal Comment --     CHL IP CERVICAL ESOPHAGEAL PHASE 11/09/2017  Cervical Esophageal Phase WFL  Pudding Teaspoon --  Pudding Cup --  Honey Teaspoon --  Honey Cup --  Nectar Teaspoon --  Nectar Cup --  Nectar Straw --  Thin Teaspoon --  Thin Cup --  Thin Straw --  Puree --  Mechanical Soft --  Regular --  Multi-consistency --  Pill --  Cervical Esophageal Comment --    Herbie Baltimore, MA CCC-SLP   Acute Rehabilitation Services Pager [phone] Office [phone]  Lynann Beaver 11/14/2017, 1:23 PM

## 2017-11-14 NOTE — Telephone Encounter (Signed)
Copied from [facility] [id number]. Topic: General - Inquiry >> Nov 14, 2017  9:24 AM Scherrie Gerlach wrote: Reason for CRM: wife [name] states pt used to see [name]. Wife wants [name] to call her when she gets a chance.  Pt see [name] now,  she states it has nothing to do with that. She did not want to disclose what she needs to speak with [name] about concerning the pt.

## 2017-11-14 NOTE — Social Work (Signed)
CSW continuing to follow for support with disposition when medically appropriate.  Rozalynn Buege, MSW, LCSWA [place] Clinical Social Work [phone]   

## 2017-11-14 NOTE — Progress Notes (Signed)
Physical Therapy Treatment Patient Details Name: [name] MRN: [id number] DOB: February 17, 1945 Today's Date: 11/14/2017    History of Present Illness 72 y.o. male admitted on 10/27/17 for an elective repair of his L elbow olecranon fx that he sustained after a fall on 10/21/17.  Post operative complications of ETOH withdrawl, agitation, VDRF (inutbated during surgery and extubated 11/04/17 (self extubated on 11/01/17, but had to be re-intubated)).  Pt had to be placed on BiPAP post extubation, and as of 11/07/17 has progressed to nasa, cannula.  Pt with significant PMH of PVD, midl CAD, HOH, falls, dialated cardiomyopathy, Atrial tachycarida, anemia, alcohol induced dementia, L THA s/p fall (10/2016), posterior cervical fusion, L ORIF humerus fx, back surgery.      PT Comments    Pt had just transferred back to bed after being in chair all morning and was asleep on entry. Once awoken pt agreeable to getting up with therapy, however pt is somewhat agitated once mobility is started. Pt is modAx2 for bed mobility and transfers. Pt unable to be directed in lateral steps or scooting to HoB. Pt also unable to be directed in therapeutic exercise. Pt returning himself to supine with modAx2  for LE management and reduced use of L UE. Plans for d/c remain appropriate. PT to follow acutely.    Follow Up Recommendations  SNF     Equipment Recommendations  None recommended by PT       Precautions / Restrictions Precautions Precautions: Fall Precaution Comments: Per notes in chart, pt was sedentary and did limited walking PTA since his hip surgery in 10/2016. Restrictions Weight Bearing Restrictions: Yes LUE Weight Bearing: Non weight bearing    Mobility  Bed Mobility Overal bed mobility: Needs Assistance Bed Mobility: Supine to Sit Rolling: Mod assist   Supine to sit: Mod assist;+2 for physical assistance;HOB elevated Sit to supine: Mod assist;+2 for physical assistance   General bed mobility  comments: Able to initiate LEs to get to EOB, assist with trunk, and pad scoot to EoB, scooting bottom, requires assist and cuing to not utilize L UE to aide in mobility. Pt with repeated attempts to lay down in bed prior to therapist being ready to assist and pt needed redirection to stay seated EoB, once back in bed requires modA for rolling to place clean pad  Transfers Overall transfer level: Needs assistance Equipment used: 2 person hand held assist Transfers: Sit to/from Stand Sit to Stand: Mod assist;+2 physical assistance         General transfer comment: Assist of 2 to power to standing with posterior lean and LEs resting on rail, only able to perform x 1, attempted to laterally scoot up EoB, only able to scoot once before trying to lay down  Ambulation/Gait             General Gait Details: pt unable to take lateral steps towards HoB         Balance Overall balance assessment: Needs assistance Sitting-balance support: Feet supported;Single extremity supported Sitting balance-Leahy Scale: Good   Postural control: Posterior lean Standing balance support: During functional activity Standing balance-Leahy Scale: Poor Standing balance comment: Max A for standing balance due to heavy posterior lean.                            Cognition Arousal/Alertness: Awake/alert Behavior During Therapy: Agitated Overall Cognitive Status: Impaired/Different from baseline Area of Impairment: Orientation;Memory;Following commands;Awareness;Problem solving;Safety/judgement  Orientation Level: Disoriented to;Time;Situation;Place Current Attention Level: Sustained Memory: Decreased recall of precautions;Decreased short-term memory Following Commands: Follows one step commands inconsistently;Follows one step commands with increased time Safety/Judgement: Decreased awareness of safety;Decreased awareness of deficits Awareness: Intellectual Problem  Solving: Slow processing;Decreased initiation;Difficulty sequencing;Requires verbal cues;Requires tactile cues General Comments: Has sat up in the chair most of the morning and is slightly aggitated on entry, takes increased cuing to perform mobility         General Comments General comments (skin integrity, edema, etc.): Pt family present and very involved with pt care throughout session. Attempted exercise at [facility] but pt with increased resistance to AAROM of LE and then laid down on side wanting to go back to bed.       Pertinent Vitals/Pain Pain Assessment: Faces Faces Pain Scale: No hurt           PT Goals (current goals can now be found in the care plan section) Acute Rehab PT Goals PT Goal Formulation: Patient unable to participate in goal setting Time For Goal Achievement: 11/21/17 Potential to Achieve Goals: Fair Progress towards PT goals: Not progressing toward goals - comment(increased aggitation and inability to follow commands)    Frequency    Min 3X/week      PT Plan Current plan remains appropriate       AM-PAC PT "6 Clicks" Daily Activity  Outcome Measure  Difficulty turning over in bed (including adjusting bedclothes, sheets and blankets)?: Unable Difficulty moving from lying on back to sitting on the side of the bed? : Unable Difficulty sitting down on and standing up from a chair with arms (e.g., wheelchair, bedside commode, etc,.)?: Unable Help needed moving to and from a bed to chair (including a wheelchair)?: A Lot Help needed walking in hospital room?: A Lot Help needed climbing 3-5 steps with a railing? : Total 6 Click Score: 8    End of Session Equipment Utilized During Treatment: Oxygen;Gait belt Activity Tolerance: Patient tolerated treatment well Patient left: in bed;with call bell/phone within reach;with bed alarm set;with nursing/sitter in room;with family/visitor present Nurse Communication: Mobility status PT Visit Diagnosis: Muscle  weakness (generalized) (M62.81);Difficulty in walking, not elsewhere classified (R26.2);Pain Pain - Right/Left: Left Pain - part of body: Arm     Time: 1338-1400 PT Time Calculation (min) (ACUTE ONLY): 22 min  Charges:  $Therapeutic Activity: 8-22 mins                     Ibrahima Holberg B. Migdalia Dk PT, DPT Acute Rehabilitation Services Pager [phone] Office [phone]    [place] 11/14/2017, 3:41 PM

## 2017-11-14 NOTE — Progress Notes (Signed)
PMT no charge note  I discussed with SLP this am, regarding the patient's swallow study, subsequently, also discussed with Dr Algis Liming as well.   It appears that the patient's mental status has improved and he has been cleared for attempting POs.  CSW has been consulted to help facilitate SNF placements.  Recommend palliative care follow up with the patient at the SNF.   Call placed and unable to reach wife [name] over the phone, call placed and discussed the above with 62 brother in law, who stated that [name] was aware of the speech therapy results and of the plan for next steps being transfer to SNF.   No additional PMT specific recommendation at this time. We can follow prn.   Loistine Chance MD [facility] health palliative medicine team [phone]

## 2017-11-14 NOTE — Progress Notes (Signed)
[name]  WEX:[id number] DOB: 07/26/1945 DOA: 10/27/2017 PCP: Elby Beck, FNP    Brief Narrative:  38 male w/ a hx of EtOH abuse, PAD, CAD, HTN, Afib, and GERD who presented to [facility] on 10/17 for a left olecranon fracture.  Post op he was unable to be extubated by surgery, and was transferred to [facility].  Extubated 10/25 and returned to DNR status (was prior to surgery).  Significant Events: 10/17 admit for repair of left olecranon fracture 10/18 unable to extubate, admitted to [facility] 10/22 self-extubated, failed spontaneous breathing, reintubated 10/25 extubated, per wife's explanation of prior wishes made DNR 10/27 off cpap, alert and talking on [place]  Subjective: Patient alert but not saying much.  RN at bedside changing dressing on right upper extremity PICC line.  As per brother-in-law at bedside, patient has been calmer this morning and slept some last night which is better.  He however indicates that patient's confusion/mental status changes have not really changed much over the last 2 3 days.  Assessment & Plan:  Acute hypoxic respiratory failure with hypoxemia Resolved.  Chronic bronchiectasis with acute exacerbation with MRSA cont 3% saline nebs + completed IV vancomycin from 10/21-10/27- will need high res CT after hospitalization per PCCM.  No changes/stable.PCCM sign off note from 10/27 states MRSA treatment for total of 14 days but has not been on vancomycin after 10/27.  No fevers, leukocytosis, negative chest x-ray, thereby continue to monitor off of antibiotics.  No change.  Oropharyngeal dysphagia SLP input appreciated, FEES 10/30, high aspiration risk, recommend NPO and alternate means of medications and nutrition.  Changed as many meds as possible to IV or rectal.  Palliative care input appreciated, time-limited trial of current interventions over the next 2 to 3 days and try to reassess swallow eval on 11/4.  As per family, no PEG tube.  As per my discussion with  PMT, ST evaluated patient today and recommend clear liquids with close follow-up.  Acute pulmonary edema - Acute diastolic CHF exacerbation - Dilated cardiomyopathy No clinical evidence of signif volume overload at this time - w/ poor intake have stopped lasix for now - follow.  Actually appears on the dry side.  Started gentle IV fluids for dehydration.  Does not appear clinically volume overloaded.  Continue gentle IV fluids until able to sustain oral intake.  Dehydration with hypernatremia Likely related to poor oral intake and diuresis.  Hypernatremia resolved.  Euvolemic.  Continue gentle IV fluids until able to sustain hydration by oral intake.  Hypokalemia Replaced.  Acute metabolic encephalopathy/delirium - EtOH withdrawal Required precedex earlier in stay - cont w/ prn ativan and haldol for now -ongoing issues with confusion and intermittent agitation.  Ongoing delirium.  Ammonia on 10/27/2016 was normal.  Ammonia level normal.  No significant change in status.  Given history of difficulty urinating/??  Dysuria, check bladder scan, no urinary retention and urine microscopy negative for features of UTI.  Apparently gets more agitated with Ativan, discontinued.  Minimize use of chemical restraints, frequent reorientation with family's assistance and delirium precautions.  Mental status has somewhat improved over the last 3 days.  He has been more alert, less agitated and restless.  He has not received Haldol since 11/2 early morning hours.  Continue supportive care and hope for recovery.  Anemia Hgb stable  Afib w/ RVR Controlled ventricular rate.  Continue IV metoprolol.  Discontinued telemetry.  Left Olecranon Fracture s/p ORIF Per Ortho.  Nonweightbearing on left upper extremity.  Outpatient follow-up with orthopedics.  Adult failure to thrive Extensive discussion with patient's spouse, rest of family at bedside.  As per report, patient has been steadily declining both physically  and mentally over the last 2 years.  They indicate that he would not have wished for any aggressive measures such as permanent PEG tube.  I explained to them that it is unclear as to when and if his mental status and dysphagia will improve enough to try oral diet.  Meanwhile he is not getting any substantial nutrition.  Overall his long-term prognosis is poor.  Palliative care consultation was discussed and they were agreeable.  Discussed with Dr. Rowe Pavy, she updated me regarding ST input and plans to follow.  DVT prophylaxis: lovenox  Code Status: DNR - NO CODE Family Communication: I discussed with patient's brother-in-law at bedside, updated care and answered questions. Disposition Plan: To be determined pending further improvement in mental status and tolerance of oral intake.  I discussed with clinical Education officer, museum.  Consultants:  PCCM Ortho PMT  Antimicrobials:  Vanc 10/21 >   Cefazolin 10/17 >10/17  Objective:  Vitals:   11/13/17 1358 11/13/17 2011 11/14/17 0027 11/14/17 0649  BP: (!) 118/51 132/72 105/81 126/77  Pulse: 80 76 80 77  Resp: _0 Temp: 98.1 F (36.7 C) 98 F (36.7 C) 97.9 F (36.6 C) 98.1 F (36.7 C)  TempSrc: Axillary Axillary Axillary Axillary  SpO2: 97% 97% 96% 97%  Weight:      Height:        Intake/Output Summary (Last 24 hours) at 11/14/2017 1237 Last data filed at 11/14/2017 0900 Gross per 24 hour  Intake 560.42 ml  Output 250 ml  Net 310.42 ml   Filed Weights   11/03/17 0453 11/04/17 0500 11/07/17 0400  Weight: 85.3 kg 85.6 kg 79.2 kg    Examination:  General: Elderly male, moderately built and nourished, generally ill looking, lying propped up in bed.  Does not appear in any discomfort or distress.  Does not appear restless. Lungs: Clear to auscultation.  No increased work of breathing. Cardiovascular: S1 and S2 heard, RRR.  No JVD, murmurs or pedal edema.  Stable.  Abdomen: Nondistended, soft and nontender.  No organomegaly or  masses appreciated.  Normal bowel sounds heard.  Stable. CNS: Alert but not oriented.  Does not follow instructions.  Not agitated this morning.  Stable Extremities: No lower extremity edema.  Moves all extremities well.  Left upper extremity limited movement secondary to postop splint.  Has mittens.  CBC: Recent Labs  Lab 11/10/17 0427 11/11/17 0141 11/13/17 0355  WBC 9.3 9.8 11.1*  HGB 10.1* 10.0* 11.1*  HCT 33.1* 32.2* 36.4*  MCV 90.9 90.2 88.1  PLT 582* 506* 747*   Basic Metabolic Panel: Recent Labs  Lab 11/08/17 0355  11/10/17 0427 11/11/17 0141 11/13/17 0355  NA 144   < > 144 142 138  K 3.9   < > 3.4* 4.1 3.6  CL 108   < > 114* 108 108  CO2 24   < > _1 GLUCOSE 117*   < > 127* 117* 102*  BUN 26*   < > _2 CREATININE 1.19   < > 0.99 1.00 1.19  CALCIUM 9.1   < > 8.9 8.9 9.1  MG 2.0  --   --   --   --   PHOS 4.3  --   --   --   --    < > =  values in this interval not displayed.   GFR: Estimated Creatinine Clearance: 55.2 mL/min (by C-G formula based on SCr of 1.19 mg/dL).  Liver Function Tests: Recent Labs  Lab 11/08/17 0355 11/09/17 0342 11/11/17 0141 11/13/17 0355  AST 68* 68* 46* 43*  ALT 72* 75* 62* 57*  ALKPHOS 288* 267* 217* 211*  BILITOT 1.3* 1.3* 0.8 1.3*  PROT 7.4 7.1 6.7 7.3  ALBUMIN 3.1* 3.0* 2.8* 3.0*    HbA1C: Hgb A1c MFr Bld  Date/Time Value Ref Range Status  03/03/2012 12:00 PM 5.6 4.6 - 6.5 % Final    Comment:    Glycemic Control Guidelines for People with Diabetes:Non Diabetic:  <6%Goal of Therapy: <7%Additional Action Suggested:  >8%     CBG: Recent Labs  Lab 11/08/17 2350 11/09/17 0356 11/09/17 0752 11/09/17 1209 11/09/17 1541  GLUCAP 91 115* 94 90 112*    No results found for this or any previous visit (from the past 240 hour(s)).   Scheduled Meds: . aspirin  300 mg Rectal Daily  . chlorhexidine gluconate (MEDLINE KIT)  15 mL Mouth Rinse BID  . Chlorhexidine Gluconate Cloth  6 each Topical Daily  .  cloNIDine  0.2 mg Transdermal Weekly  . enoxaparin (LOVENOX) injection  40 mg Subcutaneous Q24H  . mouth rinse  15 mL Mouth Rinse 10 times per day  . metoprolol tartrate  10 mg Intravenous Q6H  . ramelteon  8 mg Oral Once  . sodium chloride flush  10-40 mL Intracatheter Q12H  . thiamine injection  100 mg Intravenous Daily     LOS: 18 days   Vernell Leep, MD, FACP, [facility] Pager [phone]  If 7PM-7AM, please contact night-coverage [email or web address] Password [facility] 11/14/2017, 12:37 PM

## 2017-11-15 MED ORDER — PANTOPRAZOLE SODIUM 40 MG PO TBEC
40.0000 mg | DELAYED_RELEASE_TABLET | Freq: Every day | ORAL | Status: DC
  Administered 2017-11-15 – 2017-11-16 (×2): 40 mg via ORAL
  Filled 2017-11-15 (×2): qty 1

## 2017-11-15 MED ORDER — ACETAMINOPHEN 325 MG PO TABS
650.0000 mg | ORAL_TABLET | Freq: Four times a day (QID) | ORAL | Status: DC | PRN
  Administered 2017-11-16: 650 mg via ORAL
  Filled 2017-11-15: qty 2

## 2017-11-15 MED ORDER — VITAMIN B-1 100 MG PO TABS
100.0000 mg | ORAL_TABLET | Freq: Every day | ORAL | Status: DC
  Administered 2017-11-16: 100 mg via ORAL
  Filled 2017-11-15: qty 1

## 2017-11-15 MED ORDER — METOPROLOL TARTRATE 25 MG PO TABS
25.0000 mg | ORAL_TABLET | Freq: Two times a day (BID) | ORAL | Status: DC
  Administered 2017-11-15 – 2017-11-16 (×2): 25 mg via ORAL
  Filled 2017-11-15 (×2): qty 1

## 2017-11-15 MED ORDER — CLONIDINE HCL 0.1 MG/24HR TD PTWK
0.1000 mg | MEDICATED_PATCH | TRANSDERMAL | Status: DC
  Administered 2017-11-16: 0.1 mg via TRANSDERMAL
  Filled 2017-11-15: qty 1

## 2017-11-15 MED ORDER — MELATONIN 3 MG PO TABS
3.0000 mg | ORAL_TABLET | Freq: Every evening | ORAL | Status: DC | PRN
  Administered 2017-11-15: 3 mg via ORAL
  Filled 2017-11-15 (×2): qty 1

## 2017-11-15 MED ORDER — FOLIC ACID 1 MG PO TABS
1.0000 mg | ORAL_TABLET | Freq: Every day | ORAL | Status: DC
  Administered 2017-11-16: 1 mg via ORAL
  Filled 2017-11-15: qty 1

## 2017-11-15 MED ORDER — DIPHENHYDRAMINE HCL 25 MG PO CAPS
50.0000 mg | ORAL_CAPSULE | Freq: Once | ORAL | Status: AC
  Administered 2017-11-15: 50 mg via ORAL
  Filled 2017-11-15: qty 2

## 2017-11-15 MED ORDER — ADULT MULTIVITAMIN W/MINERALS CH
1.0000 | ORAL_TABLET | Freq: Every day | ORAL | Status: DC
  Administered 2017-11-16: 1 via ORAL
  Filled 2017-11-15: qty 1

## 2017-11-15 MED ORDER — ASPIRIN EC 325 MG PO TBEC
325.0000 mg | DELAYED_RELEASE_TABLET | Freq: Every day | ORAL | Status: DC
  Administered 2017-11-16: 325 mg via ORAL
  Filled 2017-11-15: qty 1

## 2017-11-15 NOTE — Social Work (Signed)
CSW spoke with pt wife [name], discussed that pt is nearing medical stability and that we would be referring pt out to SNFs. Pt family states that pt has been to SNF at [facility] before which is near their home. Pt family had concerns and questions about removing the bedside sitter. Discussed that at SNF there is no bedside sitters, 1 on 1 support, restraints, or bed rails. Discussed that many families who have loved ones with memory loss/delirium hire private sitters for when family is unable to be at bedside. The hospital has to mimic the setting of a SNF and therefore discontinued to the sitter to prepare pt for that. Pt wife afraid pt will fall if he gets up when confused. CSW again encouraged family to look at private sitter services and additional care from family to be at bedside. CSW is unable to provide assistance with referrals for bedside sitters/aide services.   CSW will follow up with pt family tomorrow after 10am to discuss SNF offers.  Westley Hummer, MSW, [place] Work [phone]

## 2017-11-15 NOTE — Progress Notes (Addendum)
[name]  WYO:[id number] DOB: July 11, 1945 DOA: 10/27/2017 PCP: Elby Beck, FNP    Brief Narrative:  67 male w/ a hx of EtOH abuse, PAD, CAD, HTN, Afib, and GERD who presented to [facility] on 10/17 for a left olecranon fracture.  Post op he was unable to be extubated by surgery, and was transferred to [facility].  Extubated 10/25 and returned to DNR status (was prior to surgery).  CCM signed off several days ago and patient was transferred to medical bed.  Hospital course complicated by hypernatremia (resolved), delirium which is slow to improve and dysphagia.  Overall slowly improving.  Significant Events: 10/17 admit for repair of left olecranon fracture 10/18 unable to extubate, admitted to [facility] 10/22 self-extubated, failed spontaneous breathing, reintubated 10/25 extubated, per wife's explanation of prior wishes made DNR 10/27 off cpap, alert and talking on [place]  Subjective: Patient alert and oriented to self and place today.  Intermittently mumbles incomprehensibly.  Asking for mittens to be removed.  As per RN, patient not agitated and feels that sitter and mittens can be removed today and monitored closely.  Patient has bed alarms on.  Also family may provide assistance to sit with patient.  Assessment & Plan:  Acute hypoxic respiratory failure with hypoxemia Resolved.  Chronic bronchiectasis with acute exacerbation with MRSA cont 3% saline nebs + completed IV vancomycin from 10/21-10/27- will need high res CT after hospitalization per PCCM.  No changes/stable.PCCM sign off note from 10/27 states MRSA treatment for total of 14 days but has not been on vancomycin after 10/27.  No fevers, leukocytosis, negative chest x-ray, thereby continue to monitor off of antibiotics.  No change.  Oropharyngeal dysphagia SLP input appreciated, FEES 10/30, high aspiration risk, recommend NPO and alternate means of medications and nutrition.  Changed as many meds as possible to IV or rectal.   Palliative care input appreciated, time-limited trial of current interventions over the next 2 to 3 days and try to reassess swallow eval on 11/4.  As per family, no PEG tube.  ST started dysphagia 2 diet and thin liquids.  As per RN, eating a little but seems to be tolerating okay.  Acute pulmonary edema - Acute diastolic CHF exacerbation - Dilated cardiomyopathy No clinical evidence of signif volume overload at this time - w/ poor intake have stopped lasix for now - follow.  Actually appears on the dry side.  Started gentle IV fluids for dehydration.  Does not appear clinically volume overloaded.  Continue gentle IV fluids until able to sustain oral intake, then discontinue IV fluids.  Dehydration with hypernatremia Likely related to poor oral intake and diuresis.  Hypernatremia resolved.  Euvolemic.  Continue gentle IV fluids until able to sustain hydration by oral intake.  Hypokalemia Replaced.  Acute metabolic encephalopathy/delirium - EtOH withdrawal Required precedex earlier in stay - cont w/ prn ativan and haldol for now -ongoing issues with confusion and intermittent agitation.  Ongoing delirium.  Ammonia on 10/27/2016 was normal.  Ammonia level normal.  No significant change in status.  Given history of difficulty urinating/??  Dysuria, check bladder scan, no urinary retention and urine microscopy negative for features of UTI.  Apparently gets more agitated with Ativan, discontinued.  Minimize use of chemical restraints, frequent reorientation with family's assistance and delirium precautions.  Mental status has somewhat improved over the last 3 days.  Mental status gradually improving >more alert, less agitated and restless, has not required much Haldol, more oriented today.  Continue to  monitor.  Reducing clonidine patch, consider DC at discharge.  Anemia Hgb stable  Afib w/ RVR Controlled ventricular rate.  Now that patient taking by mouth, changed IV metoprolol to oral metoprolol 25  mg twice daily, titrate as needed.  Discontinued telemetry.  Left Olecranon Fracture s/p ORIF Per Ortho.  Nonweightbearing on left upper extremity.  Outpatient follow-up with orthopedics.  Adult failure to thrive Extensive discussion with patient's spouse, rest of family at bedside.  As per report, patient has been steadily declining both physically and mentally over the last 2 years.  They indicate that he would not have wished for any aggressive measures such as permanent PEG tube.  I explained to them that it is unclear as to when and if his mental status and dysphagia will improve enough to try oral diet.  Meanwhile he is not getting any substantial nutrition.  Overall his long-term prognosis is poor.  Palliative care consultation was discussed and they were agreeable.  Since delirium and dysphagia gradually improving, patient now likely to go to SNF and hence palliative care team signed off.  DVT prophylaxis: lovenox  Code Status: DNR - NO CODE Family Communication: None at bedside this morning. Disposition Plan: Pending further improvement in mental status and dysphagia, possible discharge to SNF in the next 1 to 2 days.  Consultants:  PCCM Ortho PMT  Antimicrobials:  Vanc 10/21 >   Cefazolin 10/17 >10/17  Objective:  Vitals:   11/15/17 1124 11/15/17 1400 11/15/17 1513 11/15/17 1600  BP: (!) 143/91  (!) 147/106 (!) 151/83  Pulse: (!) 117 78 66 73  Resp:      Temp:  97.7 F (36.5 C)    TempSrc:  Axillary    SpO2:  100%    Weight:      Height:        Intake/Output Summary (Last 24 hours) at 11/15/2017 1744 Last data filed at 11/15/2017 0413 Gross per 24 hour  Intake 10 ml  Output 125 ml  Net -115 ml   Filed Weights   11/04/17 0500 11/07/17 0400 11/15/17 0619  Weight: 85.6 kg 79.2 kg 76.2 kg    Examination:  General: Elderly male, moderately built and nourished, generally ill looking, lying propped up in bed.  Stable Lungs: Clear to auscultation.  No increased  work of breathing.  Stable Cardiovascular: S1 and S2 heard, RRR.  No JVD, murmurs or pedal edema.  Stable abdomen: Nondistended, soft and nontender.  No organomegaly or masses appreciated.  Normal bowel sounds heard.  Stable CNS: Alert and oriented to self and place.  Follows simple instructions.  Not agitated this morning and more directable.  Improved. Extremities: No lower extremity edema.  Moves all extremities well.  Left upper extremity limited movement secondary to postop splint.  Has mittens.  CBC: Recent Labs  Lab 11/10/17 0427 11/11/17 0141 11/13/17 0355  WBC 9.3 9.8 11.1*  HGB 10.1* 10.0* 11.1*  HCT 33.1* 32.2* 36.4*  MCV 90.9 90.2 88.1  PLT 582* 506* 607*   Basic Metabolic Panel: Recent Labs  Lab 11/10/17 0427 11/11/17 0141 11/13/17 0355  NA 144 142 138  K 3.4* 4.1 3.6  CL 114* 108 108  CO2 _0 GLUCOSE 127* 117* 102*  BUN _1 CREATININE 0.99 1.00 1.19  CALCIUM 8.9 8.9 9.1   GFR: Estimated Creatinine Clearance: 54.3 mL/min (by C-G formula based on SCr of 1.19 mg/dL).  Liver Function Tests: Recent Labs  Lab 11/09/17 0342 11/11/17 0141 11/13/17  0355  AST 68* 46* 43*  ALT 75* 62* 57*  ALKPHOS 267* 217* 211*  BILITOT 1.3* 0.8 1.3*  PROT 7.1 6.7 7.3  ALBUMIN 3.0* 2.8* 3.0*    HbA1C: Hgb A1c MFr Bld  Date/Time Value Ref Range Status  03/03/2012 12:00 PM 5.6 4.6 - 6.5 % Final    Comment:    Glycemic Control Guidelines for People with Diabetes:Non Diabetic:  <6%Goal of Therapy: <7%Additional Action Suggested:  >8%     CBG: Recent Labs  Lab 11/08/17 2350 11/09/17 0356 11/09/17 0752 11/09/17 1209 11/09/17 1541  GLUCAP 91 115* 94 90 112*    No results found for this or any previous visit (from the past 240 hour(s)).   Scheduled Meds: . aspirin  300 mg Rectal Daily  . chlorhexidine gluconate (MEDLINE KIT)  15 mL Mouth Rinse BID  . Chlorhexidine Gluconate Cloth  6 each Topical Daily  . cloNIDine  0.2 mg Transdermal Weekly  .  enoxaparin (LOVENOX) injection  40 mg Subcutaneous Q24H  . mouth rinse  15 mL Mouth Rinse 10 times per day  . metoprolol tartrate  10 mg Intravenous Q6H  . ramelteon  8 mg Oral Once  . sodium chloride flush  10-40 mL Intracatheter Q12H  . thiamine injection  100 mg Intravenous Daily     LOS: 19 days   Vernell Leep, MD, FACP, [facility]. [facility] Pager [phone]  If 7PM-7AM, please contact night-coverage [email or web address] Password [facility] 11/15/2017, 5:44 PM

## 2017-11-15 NOTE — Progress Notes (Signed)
  Speech Language Pathology Treatment: Dysphagia  Patient Details Name: [name] MRN: [id number] DOB: 16-Jan-1945 Today's Date: 11/15/2017 Time: 0881-1031 SLP Time Calculation (min) (ACUTE ONLY): 12 min  Assessment / Plan / Recommendation Clinical Impression  Pt alert but continues to present with confusion and required mod-max visual/verbal/tactile cues to participate in dysphagia treatment. One instance of delayed cough observed following consumption of serveral sips of thin and dysphagia 2 (minced/ground) lunch. Tactile cues were required to remove cup from pt's mouth to take small sips for safest swallow. Recommend continue dysphagia 2 (minced/ground), thin liquids, take small bites and sips in a minimally distracting environment. ST will continue to follow with intervention in the acute setting.   HPI HPI: Patient was admited for repair of left olecranon fracture. Post operative complications of ETOH withdrawl, agitation, VDRF (ETT 10/17-10/25 with self-extubation and reintubation on 10/22). Pt with significant PMH of HH, GERD, PNA, PVD, mild CAD, HOH, falls, dialated cardiomyopathy, Atrial tachycarida, anemia, alcohol induced dementia, L THA s/p fall (10/2016), posterior cervical fusion, L ORIF humerus fx, back surgery.        SLP Plan  Continue with current plan of care       Recommendations  Diet recommendations: Dysphagia 2 (fine chop);Thin liquid Liquids provided via: Cup;Straw Medication Administration: Whole meds with puree Supervision: Staff to assist with self feeding Compensations: Small sips/bites;Slow rate;Minimize environmental distractions Postural Changes and/or Swallow Maneuvers: Seated upright 90 degrees                Oral Care Recommendations: Oral care BID Follow up Recommendations: Skilled Nursing facility SLP Visit Diagnosis: Dysphagia, unspecified (R13.10) Plan: Continue with current plan of care       Jettie Booze, Student SLP                Jettie Booze 11/15/2017, 3:51 PM

## 2017-11-16 ENCOUNTER — Inpatient Hospital Stay (HOSPITAL_COMMUNITY): Payer: Medicare Other

## 2017-11-16 DIAGNOSIS — Z4789 Encounter for other orthopedic aftercare: Secondary | ICD-10-CM | POA: Diagnosis not present

## 2017-11-16 DIAGNOSIS — F5101 Primary insomnia: Secondary | ICD-10-CM | POA: Diagnosis not present

## 2017-11-16 DIAGNOSIS — M2550 Pain in unspecified joint: Secondary | ICD-10-CM | POA: Diagnosis not present

## 2017-11-16 DIAGNOSIS — D649 Anemia, unspecified: Secondary | ICD-10-CM | POA: Diagnosis not present

## 2017-11-16 DIAGNOSIS — I480 Paroxysmal atrial fibrillation: Secondary | ICD-10-CM | POA: Diagnosis not present

## 2017-11-16 DIAGNOSIS — R531 Weakness: Secondary | ICD-10-CM | POA: Diagnosis not present

## 2017-11-16 DIAGNOSIS — R4189 Other symptoms and signs involving cognitive functions and awareness: Secondary | ICD-10-CM | POA: Diagnosis not present

## 2017-11-16 DIAGNOSIS — G9341 Metabolic encephalopathy: Secondary | ICD-10-CM | POA: Diagnosis not present

## 2017-11-16 DIAGNOSIS — R197 Diarrhea, unspecified: Secondary | ICD-10-CM | POA: Diagnosis not present

## 2017-11-16 DIAGNOSIS — G4700 Insomnia, unspecified: Secondary | ICD-10-CM | POA: Diagnosis not present

## 2017-11-16 DIAGNOSIS — R29898 Other symptoms and signs involving the musculoskeletal system: Secondary | ICD-10-CM | POA: Diagnosis not present

## 2017-11-16 DIAGNOSIS — R0689 Other abnormalities of breathing: Secondary | ICD-10-CM | POA: Diagnosis not present

## 2017-11-16 DIAGNOSIS — S51802D Unspecified open wound of left forearm, subsequent encounter: Secondary | ICD-10-CM | POA: Diagnosis not present

## 2017-11-16 DIAGNOSIS — I509 Heart failure, unspecified: Secondary | ICD-10-CM | POA: Diagnosis not present

## 2017-11-16 DIAGNOSIS — R1312 Dysphagia, oropharyngeal phase: Secondary | ICD-10-CM | POA: Diagnosis not present

## 2017-11-16 DIAGNOSIS — R2681 Unsteadiness on feet: Secondary | ICD-10-CM | POA: Diagnosis not present

## 2017-11-16 DIAGNOSIS — Z741 Need for assistance with personal care: Secondary | ICD-10-CM | POA: Diagnosis not present

## 2017-11-16 DIAGNOSIS — R309 Painful micturition, unspecified: Secondary | ICD-10-CM | POA: Diagnosis not present

## 2017-11-16 DIAGNOSIS — K219 Gastro-esophageal reflux disease without esophagitis: Secondary | ICD-10-CM | POA: Diagnosis not present

## 2017-11-16 DIAGNOSIS — M25522 Pain in left elbow: Secondary | ICD-10-CM | POA: Diagnosis not present

## 2017-11-16 DIAGNOSIS — J9601 Acute respiratory failure with hypoxia: Secondary | ICD-10-CM | POA: Diagnosis not present

## 2017-11-16 DIAGNOSIS — N400 Enlarged prostate without lower urinary tract symptoms: Secondary | ICD-10-CM | POA: Diagnosis not present

## 2017-11-16 DIAGNOSIS — Z7409 Other reduced mobility: Secondary | ICD-10-CM | POA: Diagnosis not present

## 2017-11-16 DIAGNOSIS — N390 Urinary tract infection, site not specified: Secondary | ICD-10-CM | POA: Diagnosis not present

## 2017-11-16 DIAGNOSIS — G9340 Encephalopathy, unspecified: Secondary | ICD-10-CM | POA: Diagnosis not present

## 2017-11-16 DIAGNOSIS — R488 Other symbolic dysfunctions: Secondary | ICD-10-CM | POA: Diagnosis not present

## 2017-11-16 DIAGNOSIS — M7989 Other specified soft tissue disorders: Secondary | ICD-10-CM | POA: Diagnosis not present

## 2017-11-16 DIAGNOSIS — I25110 Atherosclerotic heart disease of native coronary artery with unstable angina pectoris: Secondary | ICD-10-CM | POA: Diagnosis not present

## 2017-11-16 DIAGNOSIS — I110 Hypertensive heart disease with heart failure: Secondary | ICD-10-CM | POA: Diagnosis not present

## 2017-11-16 DIAGNOSIS — M25512 Pain in left shoulder: Secondary | ICD-10-CM | POA: Diagnosis not present

## 2017-11-16 DIAGNOSIS — E871 Hypo-osmolality and hyponatremia: Secondary | ICD-10-CM | POA: Diagnosis not present

## 2017-11-16 DIAGNOSIS — R350 Frequency of micturition: Secondary | ICD-10-CM | POA: Diagnosis not present

## 2017-11-16 DIAGNOSIS — M79602 Pain in left arm: Secondary | ICD-10-CM | POA: Diagnosis not present

## 2017-11-16 DIAGNOSIS — M6281 Muscle weakness (generalized): Secondary | ICD-10-CM | POA: Diagnosis not present

## 2017-11-16 DIAGNOSIS — J471 Bronchiectasis with (acute) exacerbation: Secondary | ICD-10-CM | POA: Diagnosis not present

## 2017-11-16 DIAGNOSIS — Z7401 Bed confinement status: Secondary | ICD-10-CM | POA: Diagnosis not present

## 2017-11-16 DIAGNOSIS — K7030 Alcoholic cirrhosis of liver without ascites: Secondary | ICD-10-CM | POA: Diagnosis not present

## 2017-11-16 DIAGNOSIS — S52022D Displaced fracture of olecranon process without intraarticular extension of left ulna, subsequent encounter for closed fracture with routine healing: Secondary | ICD-10-CM | POA: Diagnosis not present

## 2017-11-16 DIAGNOSIS — S52022A Displaced fracture of olecranon process without intraarticular extension of left ulna, initial encounter for closed fracture: Secondary | ICD-10-CM | POA: Diagnosis not present

## 2017-11-16 MED ORDER — ASPIRIN 325 MG PO TBEC: 325.0000 mg | DELAYED_RELEASE_TABLET | Freq: Every day | ORAL | 0 refills | Status: DC

## 2017-11-16 MED ORDER — METOPROLOL TARTRATE 25 MG PO TABS: 25.0000 mg | ORAL_TABLET | Freq: Two times a day (BID) | ORAL | 0 refills | Status: DC

## 2017-11-16 MED ORDER — ACETAMINOPHEN 325 MG PO TABS: 650.0000 mg | ORAL_TABLET | Freq: Four times a day (QID) | ORAL | Status: AC | PRN

## 2017-11-16 MED ORDER — MELATONIN 3 MG PO TABS: 3.0000 mg | ORAL_TABLET | Freq: Every evening | ORAL | 0 refills | Status: AC | PRN

## 2017-11-16 MED ORDER — CLONIDINE 0.1 MG/24HR TD PTWK: 0.1000 mg | MEDICATED_PATCH | TRANSDERMAL | 0 refills | Status: DC

## 2017-11-16 NOTE — Progress Notes (Signed)
Attempted report x2. No answer. [facility] just arrived to pick the patient up. Made aware that I have not given report because no one answered when the receptionist transferred the call.

## 2017-11-16 NOTE — Progress Notes (Signed)
Nutrition Follow-up  DOCUMENTATION CODES:   Obesity unspecified  INTERVENTION:   -Continue MVI with minerals daily -Continue Magic Cup TID with meals, each supplement provides 290 kcals and 9 grams protein -Hormel Shake BID, each supplement provides 520 kcals and 22 grams protein  NUTRITION DIAGNOSIS:   Inadequate oral intake related to inability to eat as evidenced by NPO status.  Progressing; on dysphagia 2 diet witht hin liquids with minimal intake  GOAL:   Patient will meet greater than or equal to 90% of their needs  Progressing  MONITOR:   Diet advancement, PO intake, Labs  REASON FOR ASSESSMENT:   Ventilator, Consult Enteral/tube feeding initiation and management  ASSESSMENT:   72 yo male with PMH of HTN, HLD, BPH, GERD, PVD, hiatal hernia, CAD, and cirrhosis of the liver who was admitted on 10/17 for planned repair of left olecranon fracture (injured s/p fall at home on 10/10). Post-op patient was unable to be extubated.   10/25- self-extubated 10/30- s/p FEES- recommend continued NPO, transferred from ICU to floor 10/31- failed BSE, recommending FEES on Monday, 11/14/17 11/4- s/p FEES- advanced to dysphagia 2 diet with thin liquids  Reviewed I/O's: +2.2 L x 24 hours -264.5 ml since 11/02/17  Noted a 3.9% wt loss x 1 week.   Pt remains with AMS and minimal intake. PO 25%. Pt and family do not desire NGT or PEG placement.   Per CSW, plan to d/c to [facility] once medically appropriate.   Labs reviewed.   Diet Order:   Diet Order            DIET DYS 2 Room service appropriate? Yes; Fluid consistency: Thin  Diet effective now              EDUCATION NEEDS:   No education needs have been identified at this time  Skin:  Skin Assessment: Skin Integrity Issues: Skin Integrity Issues:: Incisions Incisions: surgical incision to L arm  Last BM:  11/15/17  Height:   Ht Readings from Last 1 Encounters:  10/27/17 5\' 5"  (1.651 m)    Weight:    Wt Readings from Last 1 Encounters:  11/15/17 76.2 kg    Ideal Body Weight:  61.8 kg  BMI:  Body mass index is 27.96 kg/m.  Estimated Nutritional Needs:   Kcal:  1800-2000  Protein:  85-100 gm  Fluid:  1.8-2 L    Kevork Joyce A. Jimmye Norman, RD, LDN, CDE Pager: [phone] After hours Pager: [phone]

## 2017-11-16 NOTE — Social Work (Addendum)
Clinical Social Worker facilitated patient discharge including contacting patient family and facility to confirm patient discharge plans.  Clinical information faxed to facility and family agreeable with plan.  CSW arranged ambulance transport via [facility] to [facility] at [place]. RN to call [phone]  with report prior to discharge.  Clinical Social Worker will sign off for now as social work intervention is no longer needed. Please consult [place] again if new need arises.  Alexander Mt, [place] Social Worker [phone]

## 2017-11-16 NOTE — Discharge Summary (Addendum)
Physician Discharge Summary  [name] NTZ:[id number] DOB: 20-May-1945 DOA: 10/27/2017  PCP: Elby Beck, FNP  Admit date: 10/27/2017 Discharge date: 11/16/2017  Admitted From: Home Disposition:  SNF  Recommendations for Outpatient Follow-up:  1. Follow up with PCP in 1-2 weeks 2. Follow up with Dr. Eduard Roux as scheduled 3. Recommend weaning clonidine patch with goal of off as tolerated 4. Recommend high resolution chest CT within several weeks after hospitalization 5. Avoid sedating medications if possible  Discharge Condition:Improved CODE STATUS:DNR Diet recommendation: Dysphagia 2 with thin liquids   Brief/Interim Summary: 24 male w/ a hx of EtOH abuse, PAD, CAD, HTN, Afib, and GERD who presented to [facility] on 10/17 for a left olecranon fracture. Post op he was unable to be extubated by surgery, and was transferred to [facility]. Extubated 10/25 and returned to DNR status (was prior to surgery).  CCM signed off several days ago and patient was transferred to medical bed.  Hospital course complicated by hypernatremia (resolved), delirium which is slow to improve and dysphagia.   Discharge Diagnoses:  Active Problems:   Encounter for nasogastric tube placement   Closed fracture of left olecranon process   Fracture of left olecranon process, closed, initial encounter   Acute respiratory failure with hypoxemia (HCC)   Acute respiratory insufficiency   History of ETT   Endotracheally intubated   Encephalopathy   Palliative care by specialist   Goals of care, counseling/discussion  Acute hypoxic respiratory failure with hypoxemia Resolved.  Chronic bronchiectasis with acute exacerbation with MRSA cont 3% saline nebs + completed IV vancomycin from 10/21-10/27- will need high res CT after hospitalization per PCCM.  No changes/stable.PCCM sign off note from 10/27 states MRSA treatment for total of 14 days but has not been on vancomycin after 10/27.  No fevers, leukocytosis,  negative chest x-ray, thereby continue to monitor off of antibiotics.  Stable.  Oropharyngeal dysphagia SLP input appreciated, FEES 10/30, high aspiration risk, recommend NPO and alternate means of medications and nutrition.  Changed as many meds as possible to IV or rectal.  Palliative care input appreciated, time-limited trial of current interventions over the next 2 to 3 days and try to reassess swallow eval on 11/4.  As per family, no PEG tube.  ST started dysphagia 2 diet and thin liquids.  Tolerating diet  Acute pulmonary edema - Acute diastolic CHF exacerbation - Dilated cardiomyopathy No clinical evidence of signif volume overload at this time - w/ poor intake have stopped lasix for now - follow.  Actually appears on the dry side.  Started gentle IV fluids for dehydration.  Does not appear clinically volume overloaded.  Given gentle IVF  Dehydration with hypernatremia Likely related to poor oral intake and diuresis.  Hypernatremia resolved.  Euvolemic. Given gentle IV fluids until able to sustain hydration by oral intake.  Hypokalemia Replaced.  Acute metabolic encephalopathy/delirium - EtOH withdrawal Required precedex earlier in stay - cont w/ prn ativan and haldol for now -ongoing issues with confusion and intermittent agitation.  Ongoing delirium.  Ammonia on 10/27/2016 was normal.  Ammonia level normal.  No significant change in status.  Given history of difficulty urinating/??  Dysuria, check bladder scan, no urinary retention and urine microscopy negative for features of UTI.  Apparently gets more agitated with Ativan, discontinued.  Minimize use of chemical restraints, frequent reorientation with family's assistance and delirium precautions.  Mental status has somewhat improved over the last 3 days.  Mental status gradually improving >more alert, less agitated  and restless, has not required much Haldol.  Reduced clonidine patch, consider weaning to off on discharge. At time of  discharge, staff and patient reports mentation is markedly improved. Patient is now able to participate in meaningful conversations.  Anemia Hgb stable  Afib w/ RVR Controlled ventricular rate.  Now that patient taking by mouth, changed IV metoprolol to oral metoprolol 25 mg twice daily.  Discontinued telemetry.  Left Olecranon Fracture s/p ORIF Per Ortho.  Nonweightbearing on left upper extremity.  Outpatient follow-up with orthopedics.  Adult failure to thrive Extensive discussion with patient's spouse, rest of family at bedside.  As per report, patient has been steadily declining both physically and mentally over the last 2 years.  They indicate that he would not have wished for any aggressive measures such as permanent PEG tube.  I explained to them that it is unclear as to when and if his mental status and dysphagia will improve enough to try oral diet.  Meanwhile he is not getting any substantial nutrition.  Overall his long-term prognosis is poor.  Palliative care consultation was discussed and they were agreeable.  Since delirium and dysphagia gradually improving, patient now likely to go to SNF and hence palliative care team signed off.   Discharge Instructions  Discharge Instructions    Call MD / Call 911   Complete by:  As directed    If you experience chest pain or shortness of breath, CALL 911 and be transported to the hospital emergency room.  If you develope a fever above 101.5 F, pus (white drainage) or increased drainage or redness at the wound, or calf pain, call your surgeon's office.   Constipation Prevention   Complete by:  As directed    Drink plenty of fluids.  Prune juice may be helpful.  You may use a stool softener, such as Colace (over the counter) 100 mg twice a day.  Use MiraLax (over the counter) for constipation as needed.   Driving restrictions   Complete by:  As directed    No driving while taking narcotic pain meds.   Increase activity slowly as  tolerated   Complete by:  As directed      Allergies as of 11/16/2017      Reactions   Nifedipine Swelling   11/02/2011 pt denies this allergy (??)   Bee Venom Swelling      Medication List    STOP taking these medications   cyclobenzaprine 10 MG tablet Commonly known as:  FLEXERIL   DULoxetine 30 MG capsule Commonly known as:  CYMBALTA   gabapentin 100 MG capsule Commonly known as:  NEURONTIN   LORazepam 1 MG tablet Commonly known as:  ATIVAN   metoprolol succinate 25 MG 24 hr tablet Commonly known as:  TOPROL-XL   oxyCODONE 5 MG immediate release tablet Commonly known as:  Oxy IR/ROXICODONE   traZODone 50 MG tablet Commonly known as:  DESYREL     TAKE these medications   acetaminophen 325 MG tablet Commonly known as:  TYLENOL Take 2 tablets (650 mg total) by mouth every 6 (six) hours as needed for up to 20 days for mild pain or moderate pain.   aspirin 325 MG EC tablet Take 1 tablet (325 mg total) by mouth daily. Start taking on:  11/17/2017   cetirizine 10 MG tablet Commonly known as:  ZYRTEC Take 1 tablet (10 mg total) by mouth daily.   cloNIDine 0.1 mg/24hr patch Commonly known as:  CATAPRES - Dosed in mg/24  hr Place 1 patch (0.1 mg total) onto the skin once a week.   ferrous sulfate 325 (65 FE) MG tablet Take 325 mg by mouth daily as needed (low iron).   fluticasone 50 MCG/ACT nasal spray Commonly known as:  FLONASE Place 1 spray into both nostrils 2 (two) times daily as needed for allergies.   folic acid 1 MG tablet Commonly known as:  FOLVITE Take 1 tablet (1 mg total) by mouth daily.   ibuprofen 400 MG tablet Commonly known as:  ADVIL,MOTRIN Take 1 tablet (400 mg total) by mouth every 6 (six) hours as needed for moderate pain (neck pain).   lidocaine 5 % Commonly known as:  LIDODERM Place 1 patch onto the skin daily. Remove & Discard patch within 12 hours or as directed by MD What changed:    when to take this  reasons to take this    magnesium oxide 400 (241.3 Mg) MG tablet Commonly known as:  MAG-OX Take 1 tablet (400 mg total) by mouth daily.   Melatonin 3 MG Tabs Take 1 tablet (3 mg total) by mouth at bedtime as needed (Sleep).   methocarbamol 750 MG tablet Commonly known as:  ROBAXIN Take 1 tablet (750 mg total) by mouth 2 (two) times daily as needed for muscle spasms.   metoprolol tartrate 25 MG tablet Commonly known as:  LOPRESSOR Take 1 tablet (25 mg total) by mouth 2 (two) times daily.   multivitamin with minerals Tabs tablet Take 1 tablet by mouth daily.   ondansetron 4 MG tablet Commonly known as:  ZOFRAN Take 1-2 tablets (4-8 mg total) by mouth every 8 (eight) hours as needed for nausea or vomiting.   pantoprazole 40 MG tablet Commonly known as:  PROTONIX Take 1 tablet (40 mg total) by mouth daily.   potassium chloride SA 20 MEQ tablet Commonly known as:  K-DUR,KLOR-CON Take 1 tablet (20 mEq total) by mouth daily. What changed:  Another medication with the same name was removed. Continue taking this medication, and follow the directions you see here.   promethazine 25 MG tablet Commonly known as:  PHENERGAN Take 1 tablet (25 mg total) by mouth every 6 (six) hours as needed for nausea. What changed:  reasons to take this   senna-docusate 8.6-50 MG tablet Commonly known as:  Senokot-S Take 1 tablet by mouth at bedtime as needed.   thiamine 100 MG tablet Commonly known as:  VITAMIN B-1 Take 100 mg by mouth daily. Reported on 02/17/2015   VITAMIN B-12 PO Take 1 tablet by mouth daily.       Contact information for follow-up providers    Leandrew Koyanagi, MD In 1 week.   Specialty:  Orthopedic Surgery Why:  For wound re-check, For suture removal Contact information: [address] [place] [id number] [phone]            Contact information for after-discharge care    Destination    [facility] .   Service:  Skilled Nursing Contact  information: [address] [place] [phone]                 Allergies  Allergen Reactions  . Nifedipine Swelling    11/02/2011 pt denies this allergy (??)  . Bee Venom Swelling    Consultations:  Orthopedic Surgery  Palliative Care  Procedures/Studies: Dg Elbow Complete Left  Result Date: 10/21/2017 CLINICAL DATA:  Bruising and swelling EXAM: LEFT ELBOW - COMPLETE 3+  VIEW COMPARISON:  None. FINDINGS: Acute olecranon fracture extending into the elbow joint is identified with at least 1.4 cm of distraction of the fracture fragments. The radial head appears intact the osteopenic limiting assessment. No joint dislocation. Positive elbow joint effusion. Vascular calcifications are identified along the course of the brachial, radial and ulnar arteries. Posttraumatic soft tissue swelling of the included elbow and forearm. IMPRESSION: Acute intra-articular fracture through the olecranon with extension into the elbow joint and resultant positive joint effusion. 1.4 cm of distraction of fracture fragments is identified on the lateral view. These results were called by telephone at the time of interpretation on 10/21/2017 at 3:53 pm to Arlee , who verbally acknowledged these results. Soft tissue swelling of the included elbow and forearm. Electronically Signed   By: Ashley Royalty M.D.   On: 10/21/2017 15:53   Dg Forearm Left  Result Date: 10/21/2017 CLINICAL DATA:  Bruising and swelling with decreased range of motion following fall. EXAM: LEFT FOREARM - 2 VIEW COMPARISON:  None. FINDINGS: Acute olecranon fracture extending into the elbow joint is identified with at least 1.4 cm of distraction of the fracture fragments. The radial head appears intact the osteopenic limiting assessment. No joint dislocation. Positive elbow joint effusion. Vascular calcifications are identified along the course of the brachial, radial and ulnar arteries.  IMPRESSION: Acute intra-articular fracture through the olecranon with extension into the elbow joint and resultant positive joint effusion. 1.4 cm of distraction of fracture fragments is identified on the lateral view. These results were called by telephone at the time of interpretation on 10/21/2017 at 3:54 pm to Brewer , who verbally acknowledged these results. Generalized soft tissue swelling posttraumatic in etiology of the elbow and forearm. Electronically Signed   By: Ashley Royalty M.D.   On: 10/21/2017 15:54   Dg Wrist Complete Left  Result Date: 10/21/2017 CLINICAL DATA:  Initial evaluation for acute trauma, fall. EXAM: LEFT WRIST - COMPLETE 3+ VIEW COMPARISON:  Prior radiograph from 08/05/2017 FINDINGS: No acute fracture dislocation. Osteoarthritic changes present about the wrist. Diffuse osteopenia. No acute soft tissue abnormality. Prominent vascular calcifications noted. IMPRESSION: No acute osseous abnormality about the wrist. Electronically Signed   By: Jeannine Boga M.D.   On: 10/21/2017 19:22   Dg Ankle Complete Left  Result Date: 10/21/2017 CLINICAL DATA:  Initial evaluation for acute trauma, fall. EXAM: LEFT ANKLE COMPLETE - 3+ VIEW COMPARISON:  None. FINDINGS: No acute fracture or dislocation. Ankle mortise approximated. Talar dome intact. Degenerative osteoarthritic changes present about the ankle. Posterior and plantar calcaneal enthesophytes present. No acute soft tissue abnormality. Diffuse osteopenia. Prominent vascular calcifications within the visualized leg and foot. IMPRESSION: No acute osseous abnormality about the left ankle. Electronically Signed   By: Jeannine Boga M.D.   On: 10/21/2017 19:17   Ct Cervical Spine Wo Contrast  Result Date: 10/21/2017 CLINICAL DATA:  Fall, severe pain. EXAM: CT CERVICAL SPINE WITHOUT CONTRAST TECHNIQUE: Multidetector CT imaging of the cervical spine was performed without intravenous contrast. Multiplanar CT image  reconstructions were also generated. COMPARISON:  CT cervical spine dated 04/07/2017. FINDINGS: Alignment: Stable scoliosis. No evidence of acute vertebral body subluxation. Skull base and vertebrae: No evidence of acute fracture line or displaced fracture fragment. Old fracture within the odontoid is stable in alignment. Associated fixation hardware at the C1 and C2 levels appears intact and stable in alignment. Soft tissues and spinal canal: No prevertebral fluid or swelling. No visible canal hematoma. Disc levels: Mild degenerative  spondylitic changes again noted within the mid and lower cervical spine, stable. No more than mild central canal stenosis at any level. Upper chest: No acute findings. Other: Bilateral carotid atherosclerosis. IMPRESSION: 1. No acute fracture or subluxation within the cervical spine. Old fracture within the odontoid is stable in alignment, and the associated fixation hardware at the C1-C2 levels appears intact and stable in alignment. 2. Mild degenerative spondylitic changes within the mid and lower cervical spine, stable. 3. Carotid atherosclerosis. Electronically Signed   By: Franki Cabot M.D.   On: 10/21/2017 18:57   Ct Pelvis Wo Contrast  Result Date: 10/21/2017 CLINICAL DATA:  Fall. History of LEFT hip arthroplasty. Possible periprosthetic acetabular fracture. EXAM: CT PELVIS WITHOUT CONTRAST TECHNIQUE: Multidetector CT imaging of the pelvis was performed following the standard protocol without intravenous contrast. COMPARISON:  None. FINDINGS: LEFT hip arthroplasty hardware appears intact and appropriately positioned. No osseous fracture seen within the LEFT acetabulum or proximal LEFT femur. No fracture within the adjacent pubic rami or symphysis pubis. No fracture seen within the sacrum. No acute intrapelvic abnormality. Midline lower abdominal wall hernia contains fat only. Soft tissue edema appreciated within the subcutaneous soft tissues overlying the LEFT hip.  IMPRESSION: 1. No acute fracture. Specifically, no acute fracture within the periprosthetic acetabulum or proximal LEFT femur. 2. LEFT hip arthroplasty hardware appears intact and appropriately positioned. 3. Ill-defined edema within the subcutaneous soft tissues lateral to the LEFT hip. Electronically Signed   By: Franki Cabot M.D.   On: 10/21/2017 21:02   Dg Chest Port 1 View  Result Date: 11/10/2017 CLINICAL DATA:  Dyspnea EXAM: PORTABLE CHEST 1 VIEW COMPARISON:  11/05/2017 FINDINGS: Cardiac shadow is enlarged. Right-sided PICC line is again seen stable. Nasogastric catheter has been removed in the interval. The lungs are well aerated bilaterally. Old rib fractures on the left are noted. Postsurgical changes in the right shoulder are noted. IMPRESSION: Chronic changes without acute abnormality. Electronically Signed   By: Inez Catalina M.D.   On: 11/10/2017 09:35   Dg Chest Port 1 View  Result Date: 11/05/2017 CLINICAL DATA:  72 year old male in the ICU with respiratory failure, MRSA bronchitis. EXAM: PORTABLE CHEST 1 VIEW COMPARISON:  11/04/2017 and earlier. FINDINGS: Portable AP semi upright view at 0606 hours. Extubated. Stable enteric tube and right PICC line. Stable lung volumes. Stable cardiomegaly and mediastinal contours. Chronic bilateral rib deformities. Vague, patchy upper and lower lung opacity greater on the left is stable. No superimposed pneumothorax, pulmonary edema or pleural effusion. No areas of worsening ventilation. Previous left humerus ORIF. IMPRESSION: 1. Extubated with stable lung volumes and ventilation. Patchy bilateral opacity suspicious for infection. 2.  Otherwise stable lines and tubes. 3. Chronic bilateral rib deformities. Electronically Signed   By: Genevie Ann M.D.   On: 11/05/2017 08:40   Dg Chest Port 1 View  Result Date: 11/04/2017 CLINICAL DATA:  Ventilator dependence. EXAM: PORTABLE CHEST 1 VIEW COMPARISON:  Radiograph November 03, 2017. FINDINGS: Stable  cardiomegaly. Endotracheal and nasogastric tubes are unchanged in position. Right-sided PICC line is unchanged in position. No pneumothorax or pleural effusion is noted. Mild bibasilar subsegmental atelectasis is noted. Old left rib fractures are noted. IMPRESSION: Stable support apparatus.  Mild bibasilar subsegmental atelectasis. Electronically Signed   By: Marijo Conception, M.D.   On: 11/04/2017 10:16   Dg Chest Port 1 View  Result Date: 11/03/2017 CLINICAL DATA:  72 year old male with a history of respiratory failure EXAM: PORTABLE CHEST 1 VIEW COMPARISON:  11/02/2017 FINDINGS:  Cardiomediastinal silhouette unchanged. Endotracheal tube terminates 3.8 cm above the carina. Unchanged right upper extremity PICC. Unchanged gastric tube. Low lung volumes with mixed interstitial and airspace opacities, unchanged from the prior. IMPRESSION: Persistently low lung volumes, with mixed interstitial and airspace opacities likely a combination of atelectasis and edema. No new lobar pneumonia. Unchanged endotracheal tube, right upper extremity PICC, and gastric tube. Electronically Signed   By: Corrie Mckusick D.O.   On: 11/03/2017 10:16   Dg Chest Port 1 View  Result Date: 11/02/2017 CLINICAL DATA:  Respiratory failure. EXAM: PORTABLE CHEST 1 VIEW COMPARISON:  11/01/2017. FINDINGS: Cardiomegaly. Unchanged endotracheal tube and central venous catheter. Mild vascular congestion. Bibasilar opacities appear improved. IMPRESSION: Improved bibasilar opacities. Continued cardiomegaly with mild vascular congestion. Electronically Signed   By: Staci Righter M.D.   On: 11/02/2017 10:27   Dg Chest Port 1 View  Result Date: 11/01/2017 CLINICAL DATA:  72 year old male intubated.  Subsequent encounter. EXAM: PORTABLE CHEST 1 VIEW COMPARISON:  11/01/2017 5:14 a.m. FINDINGS: Endotracheal tube tip 3.6 cm above the carina. Right central line tip distal superior vena cava/cavoatrial junction. Nasogastric tube courses below the  diaphragm. Tip is not included on the present exam. Chin overlies right lung apex.  No obvious pneumothorax. Cardiomegaly. Calcified tortuous aorta. Mild central pulmonary vascular prominence. Bibasilar consolidation greater on the right may represent atelectasis with small right-sided pleural effusion. Cannot exclude right lower lobe infiltrate. Remote left-sided rib fractures. Prior left humerus surgery with pins traversing through the cortex. IMPRESSION: 1. Bibasilar consolidation greater on the right may represent atelectasis although cannot exclude right-sided pleural effusion or infiltrate. 2. Cardiomegaly. 3. Mild central pulmonary vascular prominence unchanged. 4. Aortic Atherosclerosis (ICD10-I70.0). Electronically Signed   By: Genia Del M.D.   On: 11/01/2017 13:06   Dg Chest Port 1 View  Result Date: 11/01/2017 CLINICAL DATA:  Acute respiratory failure with hypoxemia. EXAM: PORTABLE CHEST 1 VIEW COMPARISON:  Radiographs of October 31, 2017. FINDINGS: Stable cardiomegaly. Endotracheal nasogastric tubes are unchanged in position. Interval placement of right-sided PICC line with distal tip in expected position of cavoatrial junction. No pneumothorax is noted. Old left rib fractures are noted. Minimal bibasilar subsegmental atelectasis is noted. IMPRESSION: Stable position of endotracheal and nasogastric tubes. Interval placement of right-sided PICC line. Stable minimal bibasilar subsegmental atelectasis. Electronically Signed   By: Marijo Conception, M.D.   On: 11/01/2017 07:41   Dg Chest Port 1 View  Result Date: 10/31/2017 CLINICAL DATA:  Hypoxia EXAM: PORTABLE CHEST 1 VIEW COMPARISON:  October 30, 2017 FINDINGS: Endotracheal tube tip is 4.5 cm above the carina. Nasogastric tube tip and side port are in the stomach. No pneumothorax. There is a right pleural effusion with consolidation in the medial right base. There is atelectatic change in the left base. Heart is enlarged with pulmonary venous  hypertension. No adenopathy. There is evidence of old rib trauma on the left. There is a total shoulder replacement on the left. IMPRESSION: Tube positions as described without pneumothorax. Consolidation medial right base with small right pleural effusion. Mild left base atelectasis. Underlying pulmonary vascular congestion. Electronically Signed   By: Lowella Grip III M.D.   On: 10/31/2017 07:02   Dg Chest Port 1 View  Result Date: 10/30/2017 CLINICAL DATA:  History of endotracheal tube EXAM: PORTABLE CHEST 1 VIEW COMPARISON:  Earlier today FINDINGS: Endotracheal tube tip projects just below the clavicular heads. An orogastric tube reaches the stomach. Low volume chest with interstitial coarsening and cardiopericardial enlargement. Hazy density at  both medial bases not clearly attributable to soft tissue attenuation. Vascular pedicle widening accentuated by rotation. Numerous remote left rib fractures. Remote and repaired proximal left humerus fracture. IMPRESSION: 1. Stable hardware positioning. 2. Low volume chest with atelectasis or consolidation at the bases. Electronically Signed   By: Monte Fantasia M.D.   On: 10/30/2017 13:47   Dg Chest Port 1 View  Result Date: 10/30/2017 CLINICAL DATA:  Acute respiratory failure. EXAM: PORTABLE CHEST 1 VIEW COMPARISON:  10/29/2017 FINDINGS: Endotracheal tube 5.3 cm above the carina. Nasogastric tube with tip over the stomach in the left upper quadrant. Patient slightly rotated to the right. Lungs are adequately inflated and demonstrate mild prominence of the central perihilar markings suggesting mild vascular congestion. Stable cardiomegaly. Remainder of the exam is unchanged. IMPRESSION: Stable cardiomegaly with suggestion of mild vascular congestion. Tubes and lines as described. Electronically Signed   By: Marin Olp M.D.   On: 10/30/2017 10:20   Dg Chest Port 1 View  Result Date: 10/29/2017 CLINICAL DATA:  Acute respiratory failure. EXAM:  PORTABLE CHEST 1 VIEW COMPARISON:  One-view chest x-ray 10/28/2017 FINDINGS: The heart is enlarged. Lung volumes are low. The endotracheal tube terminates at the level of the clavicles, 7 cm above the carina. The side port of the NG tube is in the stomach. Lung volumes are low. Mild bibasilar atelectasis is present. No focal airspace disease is present otherwise. Multiple left-sided rib fractures are again noted. IMPRESSION: 1. Low lung volumes and mild bibasilar atelectasis. 2. Stable cardiomegaly without failure. 3. Support apparatus is stable. Electronically Signed   By: San Morelle M.D.   On: 10/29/2017 07:31   Portable Chest Xray  Result Date: 10/28/2017 CLINICAL DATA:  Respiratory insufficiency EXAM: PORTABLE CHEST 1 VIEW COMPARISON:  10/17/9 FINDINGS: Cardiac shadow is enlarged but stable. Endotracheal tube and nasogastric catheter are noted in satisfactory position. The lungs are well aerated bilaterally. Mild persistent left basilar infiltrate is noted. No new focal abnormality is seen. Old rib fractures are again noted on the left. IMPRESSION: Persistent left basilar infiltrate. Electronically Signed   By: Inez Catalina M.D.   On: 10/28/2017 07:12   Dg Chest Port 1 View  Result Date: 10/27/2017 CLINICAL DATA:  Intubated EXAM: PORTABLE CHEST 1 VIEW COMPARISON:  07/29/2017 FINDINGS: Endotracheal tube tip is 4.5 cm above the carina. Patient is rotated towards the right. Cardiomegaly as seen previously. The lungs are clear except for mild basilar volume loss. IMPRESSION: Endotracheal tube well positioned.  Mild basilar volume loss. Electronically Signed   By: Nelson Chimes M.D.   On: 10/27/2017 15:37   Dg Abd Portable 1v  Result Date: 10/27/2017 CLINICAL DATA:  NG tube placement EXAM: PORTABLE ABDOMEN - 1 VIEW COMPARISON:  10/24/2016 FINDINGS: Irregular airspace disease in the right infrahilar lung. Esophageal tube tip and side-port project over the gastric body. Mild air distention of  large and small bowel. IMPRESSION: Esophageal tube tip overlies the distal stomach Electronically Signed   By: Donavan Foil M.D.   On: 10/27/2017 18:38   Dg Hand Complete Left  Result Date: 10/21/2017 CLINICAL DATA:  Initial evaluation for acute trauma, fall. EXAM: LEFT HAND - COMPLETE 3+ VIEW COMPARISON:  Prior radiograph from 08/05/2017. FINDINGS: No acute fracture dislocation. Prominent osteoarthritic changes present throughout the hand, most notable at the first [facility] joint. Diffuse osteopenia. No acute soft tissue abnormality. Prominent vascular calcifications at the wrist and hand. IMPRESSION: 1. No acute osseous abnormality identified. 2. Prominent osteoarthritic changes throughout the hand,  most notable at the first [facility] joint. Electronically Signed   By: Jeannine Boga M.D.   On: 10/21/2017 19:20   Dg Hip Unilat With Pelvis 2-3 Views Left  Result Date: 10/21/2017 CLINICAL DATA:  Initial evaluation for acute trauma, fall, pain. EXAM: DG HIP (WITH OR WITHOUT PELVIS) 2-3V LEFT COMPARISON:  Prior study from 10/24/2016. FINDINGS: Bones are diffusely osteopenic, somewhat limiting evaluation for possible subtle acute nondisplaced fractures. There is question of a transverse lucency extending from the left acetabulum medially through the iliopectineal line, nonspecific, but could reflect an acute nondisplaced fracture, not entirely certain. No other acute fracture or dislocation. Bony pelvis otherwise grossly intact. SI joints approximated symmetric. No pubic diastasis. Left total hip arthroplasty in place and appears well seated. No periprosthetic lucency to suggest loosening or failure. No acute soft tissue abnormality. Degenerative changes noted about the right hip and within the lower lumbar spine. Extensive vascular calcifications noted within the pelvis and upper thighs. IMPRESSION: 1. Question linear transverse lucency extending from the left acetabular prosthesis through the iliopectineal  line. Finding is age indeterminate, but could reflect a subtle acute nondisplaced fracture. Difficult to be certain given extensive osteopenia. If there is high clinical suspicion for possible fracture, further assessment with cross-sectional imaging may be helpful for further evaluation. 2. No other acute osseous abnormality. 3. Left hip arthroplasty otherwise in place without complication. Electronically Signed   By: Jeannine Boga M.D.   On: 10/21/2017 19:15   [place] Ekg Site Rite  Result Date: 10/31/2017 If Site Rite image not attached, placement could not be confirmed due to current cardiac rhythm.   Subjective: Feeling better today  Discharge Exam: Vitals:   11/16/17 0629 11/16/17 1330  BP: 127/78 140/82  Pulse: 86 (!) 131  Resp:  (!) 26  Temp: 98.6 F (37 C) 97.7 F (36.5 C)  SpO2: 100% 100%   Vitals:   11/15/17 1600 11/15/17 2127 11/16/17 0629 11/16/17 1330  BP: (!) 151/83 (!) 107/91 127/78 140/82  Pulse: 73 75 86 (!) 131  Resp:  18  (!) 26  Temp:  98.8 F (37.1 C) 98.6 F (37 C) 97.7 F (36.5 C)  TempSrc:  Oral Oral Oral  SpO2:  99% 100% 100%  Weight:      Height:        General: Pt is alert, awake, not in acute distress Cardiovascular: RRR, S1/S2 +, no rubs, no gallops Respiratory: CTA bilaterally, no wheezing, no rhonchi Abdominal: Soft, NT, ND, bowel sounds + Extremities: no edema, no cyanosis   The results of significant diagnostics from this hospitalization (including imaging, microbiology, ancillary and laboratory) are listed below for reference.     Microbiology: No results found for this or any previous visit (from the past 240 hour(s)).   Labs: BNP (last 3 results) Recent Labs    07/29/17 1343  BNP 935.7*   Basic Metabolic Panel: Recent Labs  Lab 11/10/17 0427 11/11/17 0141 11/13/17 0355  NA 144 142 138  K 3.4* 4.1 3.6  CL 114* 108 108  CO2 23 23 22   GLUCOSE 127* 117* 102*  BUN 17 14 14   CREATININE 0.99 1.00 1.19  CALCIUM 8.9  8.9 9.1   Liver Function Tests: Recent Labs  Lab 11/11/17 0141 11/13/17 0355  AST 46* 43*  ALT 62* 57*  ALKPHOS 217* 211*  BILITOT 0.8 1.3*  PROT 6.7 7.3  ALBUMIN 2.8* 3.0*   No results for input(s): LIPASE, AMYLASE in the last 168 hours. Recent Labs  Lab 11/11/17 0142  AMMONIA 24   CBC: Recent Labs  Lab 11/10/17 0427 11/11/17 0141 11/13/17 0355  WBC 9.3 9.8 11.1*  HGB 10.1* 10.0* 11.1*  HCT 33.1* 32.2* 36.4*  MCV 90.9 90.2 88.1  PLT 582* 506* 484*   Cardiac Enzymes: No results for input(s): CKTOTAL, CKMB, CKMBINDEX, TROPONINI in the last 168 hours. BNP: Invalid input(s): POCBNP CBG: No results for input(s): GLUCAP in the last 168 hours. D-Dimer No results for input(s): DDIMER in the last 72 hours. Hgb A1c No results for input(s): HGBA1C in the last 72 hours. Lipid Profile No results for input(s): CHOL, HDL, LDLCALC, TRIG, CHOLHDL, LDLDIRECT in the last 72 hours. Thyroid function studies No results for input(s): TSH, T4TOTAL, T3FREE, THYROIDAB in the last 72 hours.  Invalid input(s): FREET3 Anemia work up No results for input(s): VITAMINB12, FOLATE, FERRITIN, TIBC, IRON, RETICCTPCT in the last 72 hours. Urinalysis    Component Value Date/Time   COLORURINE YELLOW 11/11/2017 2108   APPEARANCEUR CLEAR 11/11/2017 2108   LABSPEC 1.016 11/11/2017 2108   PHURINE 5.0 11/11/2017 2108   GLUCOSEU NEGATIVE 11/11/2017 2108   HGBUR NEGATIVE 11/11/2017 2108   BILIRUBINUR NEGATIVE 11/11/2017 2108   BILIRUBINUR neg 11/17/2010 1210   KETONESUR NEGATIVE 11/11/2017 2108   PROTEINUR NEGATIVE 11/11/2017 2108   UROBILINOGEN 1.0 11/17/2011 1451   NITRITE NEGATIVE 11/11/2017 2108   LEUKOCYTESUR NEGATIVE 11/11/2017 2108   Sepsis Labs Invalid input(s): PROCALCITONIN,  WBC,  LACTICIDVEN Microbiology No results found for this or any previous visit (from the past 240 hour(s)).  Time spent: 30 min  SIGNED:   Marylu Lund, MD  [facility] 11/16/2017, 3:40 PM  If  7PM-7AM, please contact night-coverage

## 2017-11-16 NOTE — Social Work (Signed)
CSW spoke with pt brother in law at bedside, presented them with offers and pt family would like to accept [facility] bed. Pt brother in law has questions for attending MD and orthopaedic team. Bed available at [facility] when pt medically appropriate. Has been without sitter, mittens, etc for 24 hours. Family would like [facility] when pt ready.  Westley Hummer, MSW, [place] Work [phone]

## 2017-11-16 NOTE — Plan of Care (Signed)
  Problem: Coping: Goal: Level of anxiety will decrease Outcome: Progressing   Problem: Elimination: Goal: Will not experience complications related to urinary retention Outcome: Progressing   

## 2017-11-16 NOTE — Clinical Social Work Placement (Signed)
   CLINICAL SOCIAL WORK PLACEMENT  NOTE Isaias Cowman RN to call report to [phone]  Date:  11/16/2017  Patient Details  Name: [name] MRN: [id number] Date of Birth: [birth year]  Clinical Social Work is seeking post-discharge placement for this patient at the [place] level of care (*CSW will initial, date and re-position this form in  chart as items are completed):  Yes   Patient/family provided with [place] Work Department's list of facilities offering this level of care within the geographic area requested by the patient (or if unable, by the patient's family).  Yes   Patient/family informed of their freedom to choose among providers that offer the needed level of care, that participate in Medicare, Medicaid or managed care program needed by the patient, have an available bed and are willing to accept the patient.  Yes   Patient/family informed of [place]'s ownership interest in [facility] and [facility], as well as of the fact that they are under no obligation to receive care at these facilities.  PASRR submitted to EDS on       PASRR number received on       Existing PASRR number confirmed on 11/14/17     FL2 transmitted to all facilities in geographic area requested by pt/family on 11/14/17     FL2 transmitted to all facilities within larger geographic area on       Patient informed that his/her managed care company has contracts with or will negotiate with certain facilities, including the following:        Yes   Patient/family informed of bed offers received.  Patient chooses bed at [facility]     Physician recommends and patient chooses bed at      Patient to be transferred to [facility] on 11/16/17.  Patient to be transferred to facility by [facility]     Patient family notified on 11/16/17 of transfer.  Name of family member notified:  pt wife [name]     PHYSICIAN       Additional Comment:     _______________________________________________ [place], [place] 11/16/2017, 3:17 PM

## 2017-11-16 NOTE — Progress Notes (Signed)
Physical Therapy Treatment Patient Details Name: [name] MRN: [id number] DOB: 10-18-1945 Today's Date: 11/16/2017    History of Present Illness 72 y.o. male admitted on 10/27/17 for an elective repair of his L elbow olecranon fx that he sustained after a fall on 10/21/17.  Post operative complications of ETOH withdrawl, agitation, VDRF (inutbated during surgery and extubated 11/04/17 (self extubated on 11/01/17, but had to be re-intubated)).  Pt had to be placed on BiPAP post extubation, and as of 11/07/17 has progressed to nasa, cannula.  Pt with significant PMH of PVD, midl CAD, HOH, falls, dialated cardiomyopathy, Atrial tachycarida, anemia, alcohol induced dementia, L THA s/p fall (10/2016), posterior cervical fusion, L ORIF humerus fx, back surgery.      PT Comments    Pt sitting up in chair on entry and agreeable to working with therapy today. Pt is making good progress towards his goals, however continues to be limited in safe mobility by L UE pain, and decreased strength, balance and endurance. Pt is currently min Ax2 for transfers and ambulation of 20 feet with HHA. Pt awaiting discharge to [facility] this afternoon.      Follow Up Recommendations  SNF     Equipment Recommendations  None recommended by PT       Precautions / Restrictions Precautions Precautions: Fall Precaution Comments: Per notes in chart, pt was sedentary and did limited walking PTA since his hip surgery in 10/2016. Restrictions Weight Bearing Restrictions: Yes LUE Weight Bearing: Non weight bearing    Mobility  Bed Mobility               General bed mobility comments: OOB in recliner on entry   Transfers Overall transfer level: Needs assistance Equipment used: 2 person hand held assist Transfers: Sit to/from Stand Sit to Stand: Min assist;+2 physical assistance         General transfer comment: minAx2 for power up and steadying, pt with increased posterior lean in standing    Ambulation/Gait Ambulation/Gait assistance: Min assist;+2 physical assistance Gait Distance (Feet): 20 Feet Assistive device: 2 person hand held assist Gait Pattern/deviations: Leaning posteriorly;Shuffle Gait velocity: slowed Gait velocity interpretation: <1.31 ft/sec, indicative of household ambulator General Gait Details: minAx 2 for steadying with waddling gait, wide BoS, and posterior lean, vc for navigation around obstacles in room          Balance Overall balance assessment: Needs assistance Sitting-balance support: Feet supported;Single extremity supported Sitting balance-Leahy Scale: Fair   Postural control: Posterior lean Standing balance support: During functional activity Standing balance-Leahy Scale: Poor Standing balance comment: requires min HHA to maintain static standing                            Cognition Arousal/Alertness: Awake/alert Behavior During Therapy: Flat affect Overall Cognitive Status: Impaired/Different from baseline Area of Impairment: Orientation;Memory;Following commands;Awareness;Problem solving;Safety/judgement                 Orientation Level: Disoriented to;Time;Situation;Place Current Attention Level: Sustained Memory: Decreased recall of precautions;Decreased short-term memory Following Commands: Follows one step commands inconsistently;Follows one step commands with increased time Safety/Judgement: Decreased awareness of safety;Decreased awareness of deficits Awareness: Intellectual Problem Solving: Slow processing;Decreased initiation;Difficulty sequencing;Requires verbal cues;Requires tactile cues General Comments: Pt with increased awareness today, able to follow commands to get up and walk to door and back, once back in chair pt began to confabulate on being sent to [place]  General Comments General comments (skin integrity, edema, etc.): Pt family in room, and encouraging for pt to participate in  therapy      Pertinent Vitals/Pain Pain Assessment: Faces Faces Pain Scale: No hurt           PT Goals (current goals can now be found in the care plan section) Acute Rehab PT Goals PT Goal Formulation: Patient unable to participate in goal setting Time For Goal Achievement: 11/21/17 Potential to Achieve Goals: Fair Progress towards PT goals: Progressing toward goals    Frequency    Min 3X/week      PT Plan Current plan remains appropriate       AM-PAC PT "6 Clicks" Daily Activity  Outcome Measure  Difficulty turning over in bed (including adjusting bedclothes, sheets and blankets)?: Unable Difficulty moving from lying on back to sitting on the side of the bed? : Unable Difficulty sitting down on and standing up from a chair with arms (e.g., wheelchair, bedside commode, etc,.)?: Unable Help needed moving to and from a bed to chair (including a wheelchair)?: A Lot Help needed walking in hospital room?: A Lot Help needed climbing 3-5 steps with a railing? : Total 6 Click Score: 8    End of Session Equipment Utilized During Treatment: Gait belt Activity Tolerance: Patient tolerated treatment well Patient left: with call bell/phone within reach;in chair;with family/visitor present Nurse Communication: Mobility status PT Visit Diagnosis: Muscle weakness (generalized) (M62.81);Difficulty in walking, not elsewhere classified (R26.2);Pain Pain - Right/Left: Left Pain - part of body: Arm     Time: 4268-3419 PT Time Calculation (min) (ACUTE ONLY): 17 min  Charges:  $Gait Training: 8-22 mins                     Alisea Matte B. Migdalia Dk PT, DPT Acute Rehabilitation Services Pager [phone] Office [phone]    [place] 11/16/2017, 4:27 PM

## 2017-11-17 ENCOUNTER — Telehealth: Payer: Self-pay

## 2017-11-17 ENCOUNTER — Inpatient Hospital Stay (INDEPENDENT_AMBULATORY_CARE_PROVIDER_SITE_OTHER): Payer: Medicare Other | Admitting: Orthopaedic Surgery

## 2017-11-17 DIAGNOSIS — R1312 Dysphagia, oropharyngeal phase: Secondary | ICD-10-CM | POA: Diagnosis not present

## 2017-11-17 DIAGNOSIS — S52022D Displaced fracture of olecranon process without intraarticular extension of left ulna, subsequent encounter for closed fracture with routine healing: Secondary | ICD-10-CM | POA: Diagnosis not present

## 2017-11-17 DIAGNOSIS — G9341 Metabolic encephalopathy: Secondary | ICD-10-CM | POA: Diagnosis not present

## 2017-11-17 DIAGNOSIS — J471 Bronchiectasis with (acute) exacerbation: Secondary | ICD-10-CM | POA: Diagnosis not present

## 2017-11-17 NOTE — Telephone Encounter (Signed)
Noted  

## 2017-11-17 NOTE — Telephone Encounter (Signed)
FYI Patient was at the hospital from 10/27/17 till 11/16/17 and per notes was discharged to [facility]. TCM not done due to this.

## 2017-11-18 DIAGNOSIS — R531 Weakness: Secondary | ICD-10-CM | POA: Diagnosis not present

## 2017-11-18 DIAGNOSIS — R4189 Other symptoms and signs involving cognitive functions and awareness: Secondary | ICD-10-CM | POA: Diagnosis not present

## 2017-11-18 DIAGNOSIS — R350 Frequency of micturition: Secondary | ICD-10-CM | POA: Diagnosis not present

## 2017-11-18 DIAGNOSIS — N400 Enlarged prostate without lower urinary tract symptoms: Secondary | ICD-10-CM | POA: Diagnosis not present

## 2017-11-22 ENCOUNTER — Ambulatory Visit (INDEPENDENT_AMBULATORY_CARE_PROVIDER_SITE_OTHER): Payer: Medicare Other

## 2017-11-22 ENCOUNTER — Encounter (INDEPENDENT_AMBULATORY_CARE_PROVIDER_SITE_OTHER): Payer: Self-pay | Admitting: Orthopaedic Surgery

## 2017-11-22 ENCOUNTER — Ambulatory Visit (INDEPENDENT_AMBULATORY_CARE_PROVIDER_SITE_OTHER): Payer: Medicare Other | Admitting: Orthopaedic Surgery

## 2017-11-22 DIAGNOSIS — R531 Weakness: Secondary | ICD-10-CM | POA: Diagnosis not present

## 2017-11-22 DIAGNOSIS — R350 Frequency of micturition: Secondary | ICD-10-CM | POA: Diagnosis not present

## 2017-11-22 DIAGNOSIS — R4189 Other symptoms and signs involving cognitive functions and awareness: Secondary | ICD-10-CM | POA: Diagnosis not present

## 2017-11-22 DIAGNOSIS — S52022A Displaced fracture of olecranon process without intraarticular extension of left ulna, initial encounter for closed fracture: Secondary | ICD-10-CM | POA: Diagnosis not present

## 2017-11-22 DIAGNOSIS — N400 Enlarged prostate without lower urinary tract symptoms: Secondary | ICD-10-CM | POA: Diagnosis not present

## 2017-11-22 NOTE — Progress Notes (Signed)
Post-Op Visit Note   Patient: [name]           Date of Birth: 31-Jan-1945           MRN: [id number] Visit Date: 11/22/2017 PCP: Elby Beck, FNP   Assessment & Plan:  Chief Complaint:  Chief Complaint  Patient presents with  . Left Elbow - Pain   Visit Diagnoses:  1. Closed fracture of olecranon process of left ulna, initial encounter     Plan: [name] is 4 weeks status post ORIF left olecranon avulsion fracture and repair of triceps tendon.  His postoperative course was complicated by respiratory failure and alcohol withdrawal which required a prolonged hospital course.  He is currently staying at a SNF.  He did recently have a fall on 11/17/2017.  Today his surgical incision is completely healed.  We remove the sutures.  He does not have any pain at rest.  His x-rays demonstrate migration of the proximal fracture fragment.  These findings were reviewed with the patient and his brother-in-law.  At this point we will attempt to treat this nonoperatively and hopefully the fracture gap will consolidate.  Patient is too high risk for revision surgery.  We will limit his range of motion of his elbow for another 4 weeks in a sling.  Nonweightbearing until follow-up in 4 weeks.  He will need 2 view x-rays of the left elbow on return.  Follow-Up Instructions: Return in about 4 weeks (around 12/20/2017).   Orders:  Orders Placed This Encounter  Procedures  . XR Elbow Complete Left (3+View)   No orders of the defined types were placed in this encounter.   Imaging: Xr Elbow Complete Left (3+view)  Result Date: 11/22/2017 Hardware is intact.  The proximal fracture fragment has migrated proximally.   PMFS History: Patient Active Problem List   Diagnosis Date Noted  . Encephalopathy   . Palliative care by specialist   . Goals of care, counseling/discussion   . Endotracheally intubated   . History of ETT   . Acute respiratory failure with hypoxemia ([place])   . Acute  respiratory insufficiency   . Closed fracture of left olecranon process 10/27/2017  . Fracture of left olecranon process, closed, initial encounter 10/27/2017  . Acute streptococcal pharyngitis 09/06/2017  . Frequent falls 06/29/2017  . Fall 04/09/2017  . Scalp laceration   . Hypomagnesemia 10/25/2016  . Hip fracture ([place]) 10/24/2016  . Displaced fracture of left femoral neck ([place]) 10/24/2016  . Thrombocytopenia ([place]) 10/24/2016  . Closed displaced fracture of left femoral neck ([place]) 10/23/2016  . Contracture of muscle of left hand 09/15/2016  . Encounter for nasogastric tube placement 07/08/2016  . Infected laceration 07/08/2016  . Generalized muscle weakness 07/08/2016  . Anemia 05/18/2016  . Syncope and collapse   . Syncope 05/17/2016  . Cellulitis 05/17/2016  . Rib fractures 05/17/2016  . Bullous pemphigoid 05/17/2016  . ETOH abuse 03/17/2016  . Dizziness and giddiness 12/09/2015  . Chronic pain syndrome 12/09/2015  . Arthritis 11/20/2015  . Mild CAD   . DCM (dilated cardiomyopathy) ([place])   . Abnormal nuclear stress test 05/07/2015  . PAC (premature atrial contraction) 04/07/2015  . Atrial tachycardia, paroxysmal ([place]) 03/10/2015  . Depression 04/12/2012  . C2 cervical fracture ([place]) 02/29/2012  . Alcohol withdrawal ([place]) 11/06/2011  . Fracture of humerus, proximal, left, closed 11/02/2011  . Hyponatremia 11/17/2010  . H/O: upper GI bleed 10/20/2010  . Stomach ulcer from aspirin/ibuprofen-like drugs (NSAID's) 10/20/2010  .  Colon polyp 08/26/2010  . Diverticulosis of colon (without mention of hemorrhage) 08/26/2010  . BPH (benign prostatic hyperplasia) 07/02/2010  . Deficiency anemia 03/09/2010  . Hyposmolality and/or hyponatremia 10/17/2009  . ALCOHOLIC CIRRHOSIS OF LIVER 07/25/2009  . Hypokalemia 04/03/2007  . Anxiety state 01/24/2007  . HLD (hyperlipidemia) 07/28/2006  . Alcohol abuse 07/28/2006  . Essential hypertension 07/28/2006  . IMPOTENCE, ORGANIC ORIGIN  07/28/2006   Past Medical History:  Diagnosis Date  . Adenomatous polyp of colon 2006  . Alcohol-induced persisting dementia ([place])   . Anemia 2012  . Anxiety   . Arthritis    "left arm/shoulder" (05/17/2016)  . Atrial tachycardia, paroxysmal ([place]) 04/07/2015  . BPH (benign prostatic hypertrophy) 5/99  . Chronic bronchitis ([place])    "q year or q other year" (11/02/2011)  . Cirrhosis of liver ([place])   . DCM (dilated cardiomyopathy) ([place])    EF 45-50% by echo and cath 2017  . Depression    Hx of  . Falls frequently    "daily lately; legs just give out" (11/02/2011; 05/17/2016)  . GERD (gastroesophageal reflux disease) 07/31/04   gastritis   . H/O hiatal hernia   . Hard of hearing   . Heart murmur   . Hernia, umbilical    "unrepaired" (11/02/2011; 05/17/2016)  . History of blood transfusion 2012  . HLD (hyperlipidemia) 5/99  . HTN (hypertension) 5/99  . Mild CAD    10% RCA  . PAC (premature atrial contraction) 04/07/2015  . Peripheral vascular disease ([place])   . Pneumonia 1996   hosp  . PONV (postoperative nausea and vomiting)   . Seasonal allergies    pollen / ragweed  . Wears glasses     Family History  Problem Relation Age of Onset  . Heart disease Father   . Diabetes Father   . Stroke Father   . Depression Mother   . Heart disease Brother   . Alcohol abuse Brother   . Liver disease Brother     Past Surgical History:  Procedure Laterality Date  . BACK SURGERY    . CARDIAC CATHETERIZATION N/A 05/23/2015   Procedure: Left Heart Cath and Coronary Angiography;  Surgeon: Troy Sine, MD;  Location: [place] CV [facility];  Service: Cardiovascular;  Laterality: N/A;  . CATARACT EXTRACTION W/ INTRAOCULAR LENS  IMPLANT, BILATERAL Bilateral 10/2002  . COLONOSCOPY  07/31/04   multiple polyps; repeat in 3 years  . DUPUYTREN CONTRACTURE RELEASE Left   . ETT myoview  03/09/07   nml EF 66%  . FRACTURE SURGERY    . HEMORRHOID SURGERY     "cauterized a long time ago" (11/02/2011)  .  hosp CP R/O'D 2/24  03/08/07  . neck MRI  6/04   C5-6 disc abnormality  . ORIF ELBOW FRACTURE Left 10/27/2017   Procedure: OPEN REDUCTION INTERNAL FIXATION (ORIF) LEFT OLECRANON;  Surgeon: Leandrew Koyanagi, MD;  Location: [facility];  Service: Orthopedics;  Laterality: Left;  . ORIF HUMERUS FRACTURE  11/04/2011   Procedure: OPEN REDUCTION INTERNAL FIXATION (ORIF) PROXIMAL HUMERUS FRACTURE;  Surgeon: Rozanna Box, MD;  Location: [place];  Service: Orthopedics;  Laterality: Left;  . POSTERIOR CERVICAL FUSION/FORAMINOTOMY N/A 03/21/2012   Procedure: POSTERIOR CERVICAL FUSION/FORAMINOTOMY LEVEL ONE;  Surgeon: Charlie Pitter, MD;  Location: [facility] NEURO ORS;  Service: Neurosurgery;  Laterality: N/A;  POSTERIOR CERVICAL ONE TO CERVICAL TWO FUSION WITH ILIAC CREST GRAFT AND LATERAL MASS SCREWS   . TONSILLECTOMY     "I was young" (11/02/2011)  .  TOTAL HIP ARTHROPLASTY Left 10/24/2016   Procedure: TOTAL HIP ARTHROPLASTY ANTERIOR APPROACH;  Surgeon: Rod Can, MD;  Location: [facility];  Service: Orthopedics;  Laterality: Left;  . UPPER GASTROINTESTINAL ENDOSCOPY     Social History   Occupational History  . Occupation: Academic librarian: [employer]  Tobacco Use  . Smoking status: Former Smoker    Types: Pipe  . Smokeless tobacco: Never Used  . Tobacco comment: 05/17/2016 "stopped in the late 1990s"  Substance and Sexual Activity  . Alcohol use: Yes    Alcohol/week: 28.0 standard drinks    Types: 28 Glasses of wine per week    Comment: last drink 10/22/17  . Drug use: No  . Sexual activity: Never

## 2017-11-23 DIAGNOSIS — R350 Frequency of micturition: Secondary | ICD-10-CM | POA: Diagnosis not present

## 2017-11-23 DIAGNOSIS — S52022D Displaced fracture of olecranon process without intraarticular extension of left ulna, subsequent encounter for closed fracture with routine healing: Secondary | ICD-10-CM | POA: Diagnosis not present

## 2017-11-23 DIAGNOSIS — R4189 Other symptoms and signs involving cognitive functions and awareness: Secondary | ICD-10-CM | POA: Diagnosis not present

## 2017-11-23 DIAGNOSIS — N400 Enlarged prostate without lower urinary tract symptoms: Secondary | ICD-10-CM | POA: Diagnosis not present

## 2017-11-24 DIAGNOSIS — N400 Enlarged prostate without lower urinary tract symptoms: Secondary | ICD-10-CM | POA: Diagnosis not present

## 2017-11-24 DIAGNOSIS — N390 Urinary tract infection, site not specified: Secondary | ICD-10-CM | POA: Diagnosis not present

## 2017-11-24 DIAGNOSIS — R350 Frequency of micturition: Secondary | ICD-10-CM | POA: Diagnosis not present

## 2017-11-24 DIAGNOSIS — R309 Painful micturition, unspecified: Secondary | ICD-10-CM | POA: Diagnosis not present

## 2017-11-25 DIAGNOSIS — M79602 Pain in left arm: Secondary | ICD-10-CM | POA: Diagnosis not present

## 2017-11-25 DIAGNOSIS — R4189 Other symptoms and signs involving cognitive functions and awareness: Secondary | ICD-10-CM | POA: Diagnosis not present

## 2017-11-25 DIAGNOSIS — N390 Urinary tract infection, site not specified: Secondary | ICD-10-CM | POA: Diagnosis not present

## 2017-11-25 DIAGNOSIS — S52022D Displaced fracture of olecranon process without intraarticular extension of left ulna, subsequent encounter for closed fracture with routine healing: Secondary | ICD-10-CM | POA: Diagnosis not present

## 2017-11-28 DIAGNOSIS — M79602 Pain in left arm: Secondary | ICD-10-CM | POA: Diagnosis not present

## 2017-11-28 DIAGNOSIS — M7989 Other specified soft tissue disorders: Secondary | ICD-10-CM | POA: Diagnosis not present

## 2017-11-28 DIAGNOSIS — R197 Diarrhea, unspecified: Secondary | ICD-10-CM | POA: Diagnosis not present

## 2017-11-28 DIAGNOSIS — S52022D Displaced fracture of olecranon process without intraarticular extension of left ulna, subsequent encounter for closed fracture with routine healing: Secondary | ICD-10-CM | POA: Diagnosis not present

## 2017-11-30 DIAGNOSIS — S52022D Displaced fracture of olecranon process without intraarticular extension of left ulna, subsequent encounter for closed fracture with routine healing: Secondary | ICD-10-CM | POA: Diagnosis not present

## 2017-11-30 DIAGNOSIS — M79602 Pain in left arm: Secondary | ICD-10-CM | POA: Diagnosis not present

## 2017-11-30 DIAGNOSIS — G4700 Insomnia, unspecified: Secondary | ICD-10-CM | POA: Diagnosis not present

## 2017-11-30 DIAGNOSIS — Z7409 Other reduced mobility: Secondary | ICD-10-CM | POA: Diagnosis not present

## 2017-12-01 DIAGNOSIS — S52022D Displaced fracture of olecranon process without intraarticular extension of left ulna, subsequent encounter for closed fracture with routine healing: Secondary | ICD-10-CM | POA: Diagnosis not present

## 2017-12-01 DIAGNOSIS — R4189 Other symptoms and signs involving cognitive functions and awareness: Secondary | ICD-10-CM | POA: Diagnosis not present

## 2017-12-01 DIAGNOSIS — M79602 Pain in left arm: Secondary | ICD-10-CM | POA: Diagnosis not present

## 2017-12-01 DIAGNOSIS — G4700 Insomnia, unspecified: Secondary | ICD-10-CM | POA: Diagnosis not present

## 2017-12-15 DIAGNOSIS — K7030 Alcoholic cirrhosis of liver without ascites: Secondary | ICD-10-CM | POA: Diagnosis not present

## 2017-12-15 DIAGNOSIS — E871 Hypo-osmolality and hyponatremia: Secondary | ICD-10-CM | POA: Diagnosis not present

## 2017-12-15 DIAGNOSIS — M7989 Other specified soft tissue disorders: Secondary | ICD-10-CM | POA: Diagnosis not present

## 2017-12-16 ENCOUNTER — Encounter (INDEPENDENT_AMBULATORY_CARE_PROVIDER_SITE_OTHER): Payer: Self-pay | Admitting: Orthopaedic Surgery

## 2017-12-20 ENCOUNTER — Ambulatory Visit (INDEPENDENT_AMBULATORY_CARE_PROVIDER_SITE_OTHER): Payer: Medicare Other | Admitting: Orthopaedic Surgery

## 2017-12-20 ENCOUNTER — Encounter (INDEPENDENT_AMBULATORY_CARE_PROVIDER_SITE_OTHER): Payer: Self-pay | Admitting: Orthopaedic Surgery

## 2017-12-20 ENCOUNTER — Ambulatory Visit (INDEPENDENT_AMBULATORY_CARE_PROVIDER_SITE_OTHER): Payer: Medicare Other

## 2017-12-20 DIAGNOSIS — S52022D Displaced fracture of olecranon process without intraarticular extension of left ulna, subsequent encounter for closed fracture with routine healing: Secondary | ICD-10-CM

## 2017-12-20 NOTE — Progress Notes (Signed)
Post-Op Visit Note   Patient: [name]           Date of Birth: Mar 13, 1945           MRN: [id number] Visit Date: 12/20/2017 PCP: Elby Beck, FNP   Assessment & Plan:  Chief Complaint:  Chief Complaint  Patient presents with  . Left Elbow - Follow-up   Visit Diagnoses:  1. Closed fracture of left olecranon process with routine healing, subsequent encounter     Plan: Russel is approximately a week status post ORIF left olecranon fracture with triceps repair.  He reports no pain.  He is currently at a SNF.  His surgical scar is fully healed.  His range of motion is 45 degrees to 110 degrees.  He does not have any pain with passive range of motion.  His x-rays demonstrate stable olecranon fragment without any proximal migration.  At this point we will discontinue the sling and begin OT for range of motion and strengthening.  I would like to see him back in 6 weeks with repeat 2 view x-rays of the left elbow.  Follow-Up Instructions: Return in about 6 weeks (around 01/31/2018).   Orders:  Orders Placed This Encounter  Procedures  . XR Elbow 2 Views Left   No orders of the defined types were placed in this encounter.   Imaging: Xr Elbow 2 Views Left  Result Date: 12/20/2017 Stable hardware fixation.  There is stable proximal olecranon fragment.   PMFS History: Patient Active Problem List   Diagnosis Date Noted  . Encephalopathy   . Palliative care by specialist   . Goals of care, counseling/discussion   . Endotracheally intubated   . History of ETT   . Acute respiratory failure with hypoxemia ([place])   . Acute respiratory insufficiency   . Closed fracture of left olecranon process 10/27/2017  . Fracture of left olecranon process, closed, initial encounter 10/27/2017  . Acute streptococcal pharyngitis 09/06/2017  . Frequent falls 06/29/2017  . Fall 04/09/2017  . Scalp laceration   . Hypomagnesemia 10/25/2016  . Hip fracture ([place]) 10/24/2016  . Displaced  fracture of left femoral neck ([place]) 10/24/2016  . Thrombocytopenia ([place]) 10/24/2016  . Closed displaced fracture of left femoral neck ([place]) 10/23/2016  . Contracture of muscle of left hand 09/15/2016  . Encounter for nasogastric tube placement 07/08/2016  . Infected laceration 07/08/2016  . Generalized muscle weakness 07/08/2016  . Anemia 05/18/2016  . Syncope and collapse   . Syncope 05/17/2016  . Cellulitis 05/17/2016  . Rib fractures 05/17/2016  . Bullous pemphigoid 05/17/2016  . ETOH abuse 03/17/2016  . Dizziness and giddiness 12/09/2015  . Chronic pain syndrome 12/09/2015  . Arthritis 11/20/2015  . Mild CAD   . DCM (dilated cardiomyopathy) ([place])   . Abnormal nuclear stress test 05/07/2015  . PAC (premature atrial contraction) 04/07/2015  . Atrial tachycardia, paroxysmal ([place]) 03/10/2015  . Depression 04/12/2012  . C2 cervical fracture ([place]) 02/29/2012  . Alcohol withdrawal ([place]) 11/06/2011  . Fracture of humerus, proximal, left, closed 11/02/2011  . Hyponatremia 11/17/2010  . H/O: upper GI bleed 10/20/2010  . Stomach ulcer from aspirin/ibuprofen-like drugs (NSAID's) 10/20/2010  . Colon polyp 08/26/2010  . Diverticulosis of colon (without mention of hemorrhage) 08/26/2010  . BPH (benign prostatic hyperplasia) 07/02/2010  . Deficiency anemia 03/09/2010  . Hyposmolality and/or hyponatremia 10/17/2009  . ALCOHOLIC CIRRHOSIS OF LIVER 07/25/2009  . Hypokalemia 04/03/2007  . Anxiety state 01/24/2007  . HLD (hyperlipidemia) 07/28/2006  .  Alcohol abuse 07/28/2006  . Essential hypertension 07/28/2006  . 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 Adenomatous polyp of colon 2006  . Alcohol-induced persisting dementia ([place])   . Anemia 2012  . Anxiety   . Arthritis    "left arm/shoulder" (05/17/2016)  . Atrial tachycardia, paroxysmal ([place]) 04/07/2015  . BPH (benign prostatic hypertrophy) 5/99  . Chronic bronchitis ([place])    "q year or q  other year" (11/02/2011)  . Cirrhosis of liver ([place])   . DCM (dilated cardiomyopathy) ([place])    EF 45-50% by echo and cath 2017  . Depression    Hx of  . Falls frequently    "daily lately; legs just give out" (11/02/2011; 05/17/2016)  . GERD (gastroesophageal reflux disease) 07/31/04   gastritis   . H/O hiatal hernia   . Hard of hearing   . Heart murmur   . Hernia, umbilical    "unrepaired" (11/02/2011; 05/17/2016)  . History of blood transfusion 2012  . HLD (hyperlipidemia) 5/99  . HTN (hypertension) 5/99  . Mild CAD    10% RCA  . PAC (premature atrial contraction) 04/07/2015  . Peripheral vascular disease ([place])   . Pneumonia 1996   hosp  . PONV (postoperative nausea and vomiting)   . Seasonal allergies    pollen / ragweed  . Wears glasses     Family History  Problem Relation Age of Onset  . Heart disease Father   . Diabetes Father   . Stroke Father   . Depression Mother   . Heart disease Brother   . Alcohol abuse Brother   . Liver disease Brother     Past Surgical History:  Procedure Laterality Date  . BACK SURGERY    . CARDIAC CATHETERIZATION N/A 05/23/2015   Procedure: Left Heart Cath and Coronary Angiography;  Surgeon: Troy Sine, MD;  Location: [facility];  Service: Cardiovascular;  Laterality: N/A;  . CATARACT EXTRACTION W/ INTRAOCULAR LENS  IMPLANT, BILATERAL Bilateral 10/2002  . COLONOSCOPY  07/31/04   multiple polyps; repeat in 3 years  . DUPUYTREN CONTRACTURE RELEASE Left   . ETT myoview  03/09/07   nml EF 66%  . FRACTURE SURGERY    . HEMORRHOID SURGERY     "cauterized a long time ago" (11/02/2011)  . hosp CP R/O'D 2/24  03/08/07  . neck MRI  6/04   C5-6 disc abnormality  . ORIF ELBOW FRACTURE Left 10/27/2017   Procedure: OPEN REDUCTION INTERNAL FIXATION (ORIF) LEFT OLECRANON;  Surgeon: Leandrew Koyanagi, MD;  Location: [facility];  Service: Orthopedics;  Laterality: Left;  . ORIF HUMERUS FRACTURE  11/04/2011   Procedure: OPEN REDUCTION INTERNAL FIXATION  (ORIF) PROXIMAL HUMERUS FRACTURE;  Surgeon: Rozanna Box, MD;  Location: [facility];  Service: Orthopedics;  Laterality: Left;  . POSTERIOR CERVICAL FUSION/FORAMINOTOMY N/A 03/21/2012   Procedure: POSTERIOR CERVICAL FUSION/FORAMINOTOMY LEVEL ONE;  Surgeon: Charlie Pitter, MD;  Location: [facility];  Service: Neurosurgery;  Laterality: N/A;  POSTERIOR CERVICAL ONE TO CERVICAL TWO FUSION WITH ILIAC CREST GRAFT AND LATERAL MASS SCREWS   . TONSILLECTOMY     "I was young" (11/02/2011)  . TOTAL HIP ARTHROPLASTY Left 10/24/2016   Procedure: TOTAL HIP ARTHROPLASTY ANTERIOR APPROACH;  Surgeon: Rod Can, MD;  Location: [facility];  Service: Orthopedics;  Laterality: Left;  . UPPER GASTROINTESTINAL ENDOSCOPY     Social History   Occupational History  . Occupation: Academic librarian:  [employer]  Tobacco Use  . Smoking status: Former Smoker    Types: Pipe  . Smokeless tobacco: Never Used  . Tobacco comment: 05/17/2016 "stopped in the late 1990s"  Substance and Sexual Activity  . Alcohol use: Yes    Alcohol/week: 28.0 standard drinks    Types: 28 Glasses of wine per week    Comment: last drink 10/22/17  . Drug use: No  . Sexual activity: Never

## 2017-12-23 DIAGNOSIS — G9341 Metabolic encephalopathy: Secondary | ICD-10-CM | POA: Diagnosis not present

## 2017-12-23 DIAGNOSIS — S52022D Displaced fracture of olecranon process without intraarticular extension of left ulna, subsequent encounter for closed fracture with routine healing: Secondary | ICD-10-CM | POA: Diagnosis not present

## 2017-12-23 DIAGNOSIS — I509 Heart failure, unspecified: Secondary | ICD-10-CM | POA: Diagnosis not present

## 2017-12-23 DIAGNOSIS — K7030 Alcoholic cirrhosis of liver without ascites: Secondary | ICD-10-CM | POA: Diagnosis not present

## 2017-12-28 ENCOUNTER — Inpatient Hospital Stay: Payer: Medicare Other | Admitting: Family Medicine

## 2017-12-28 DIAGNOSIS — Z9842 Cataract extraction status, left eye: Secondary | ICD-10-CM | POA: Diagnosis not present

## 2017-12-28 DIAGNOSIS — Z9841 Cataract extraction status, right eye: Secondary | ICD-10-CM | POA: Diagnosis not present

## 2017-12-28 DIAGNOSIS — H52223 Regular astigmatism, bilateral: Secondary | ICD-10-CM | POA: Diagnosis not present

## 2017-12-28 DIAGNOSIS — H353132 Nonexudative age-related macular degeneration, bilateral, intermediate dry stage: Secondary | ICD-10-CM | POA: Diagnosis not present

## 2017-12-28 DIAGNOSIS — H40023 Open angle with borderline findings, high risk, bilateral: Secondary | ICD-10-CM | POA: Diagnosis not present

## 2017-12-28 DIAGNOSIS — H5203 Hypermetropia, bilateral: Secondary | ICD-10-CM | POA: Diagnosis not present

## 2017-12-28 DIAGNOSIS — H35373 Puckering of macula, bilateral: Secondary | ICD-10-CM | POA: Diagnosis not present

## 2017-12-29 DIAGNOSIS — Z4789 Encounter for other orthopedic aftercare: Secondary | ICD-10-CM | POA: Diagnosis not present

## 2017-12-29 DIAGNOSIS — M6281 Muscle weakness (generalized): Secondary | ICD-10-CM | POA: Diagnosis not present

## 2017-12-29 DIAGNOSIS — M25512 Pain in left shoulder: Secondary | ICD-10-CM | POA: Diagnosis not present

## 2017-12-29 DIAGNOSIS — M25522 Pain in left elbow: Secondary | ICD-10-CM | POA: Diagnosis not present

## 2017-12-30 ENCOUNTER — Ambulatory Visit (INDEPENDENT_AMBULATORY_CARE_PROVIDER_SITE_OTHER): Payer: Medicare Other | Admitting: Family Medicine

## 2017-12-30 ENCOUNTER — Ambulatory Visit: Payer: Medicare Other | Admitting: Family Medicine

## 2017-12-30 ENCOUNTER — Encounter: Payer: Self-pay | Admitting: Family Medicine

## 2017-12-30 ENCOUNTER — Telehealth: Payer: Self-pay | Admitting: Family Medicine

## 2017-12-30 VITALS — BP 148/80 | HR 69 | Temp 98.3°F | Ht 65.0 in | Wt 203.0 lb

## 2017-12-30 DIAGNOSIS — D539 Nutritional anemia, unspecified: Secondary | ICD-10-CM

## 2017-12-30 DIAGNOSIS — M79602 Pain in left arm: Secondary | ICD-10-CM

## 2017-12-30 DIAGNOSIS — F1010 Alcohol abuse, uncomplicated: Secondary | ICD-10-CM | POA: Diagnosis not present

## 2017-12-30 DIAGNOSIS — R296 Repeated falls: Secondary | ICD-10-CM

## 2017-12-30 DIAGNOSIS — K219 Gastro-esophageal reflux disease without esophagitis: Secondary | ICD-10-CM

## 2017-12-30 DIAGNOSIS — G8929 Other chronic pain: Secondary | ICD-10-CM

## 2017-12-30 DIAGNOSIS — K7030 Alcoholic cirrhosis of liver without ascites: Secondary | ICD-10-CM

## 2017-12-30 DIAGNOSIS — I10 Essential (primary) hypertension: Secondary | ICD-10-CM

## 2017-12-30 DIAGNOSIS — E871 Hypo-osmolality and hyponatremia: Secondary | ICD-10-CM | POA: Diagnosis not present

## 2017-12-30 DIAGNOSIS — M25512 Pain in left shoulder: Secondary | ICD-10-CM

## 2017-12-30 LAB — COMPREHENSIVE METABOLIC PANEL
ALT: 10 U/L (ref 0–53)
AST: 19 U/L (ref 0–37)
Albumin: 4 g/dL (ref 3.5–5.2)
Alkaline Phosphatase: 64 U/L (ref 39–117)
BUN: 8 mg/dL (ref 6–23)
CHLORIDE: 92 meq/L — AB (ref 96–112)
CO2: 28 meq/L (ref 19–32)
CREATININE: 0.87 mg/dL (ref 0.40–1.50)
Calcium: 8.9 mg/dL (ref 8.4–10.5)
GFR: 91.84 mL/min (ref >60.00)
GLUCOSE: 102 mg/dL — AB (ref 70–99)
Potassium: 4.5 mEq/L (ref 3.5–5.1)
Sodium: 129 mEq/L — ABNORMAL LOW (ref 135–145)
Total Bilirubin: 0.3 mg/dL (ref 0.2–1.2)
Total Protein: 6.7 g/dL (ref 6.0–8.3)

## 2017-12-30 LAB — CBC WITH DIFFERENTIAL/PLATELET
BASOS ABS: 0 10*3/uL (ref 0.0–0.1)
BASOS PCT: 0.6 % (ref 0.0–3.0)
EOS ABS: 0 10*3/uL (ref 0.0–0.7)
Eosinophils Relative: 0.7 % (ref 0.0–5.0)
HEMATOCRIT: 35 % — AB (ref 39.0–52.0)
Hemoglobin: 11.7 g/dL — ABNORMAL LOW (ref 13.0–17.0)
LYMPHS ABS: 0.9 10*3/uL (ref 0.7–4.0)
LYMPHS PCT: 17.6 % (ref 12.0–46.0)
MCHC: 33.5 g/dL (ref 30.0–36.0)
MCV: 85.2 fl (ref 78.0–100.0)
MONO ABS: 0.6 10*3/uL (ref 0.1–1.0)
Monocytes Relative: 11.6 % (ref 3.0–12.0)
NEUTROS PCT: 69.5 % (ref 43.0–77.0)
Neutro Abs: 3.5 10*3/uL (ref 1.4–7.7)
PLATELETS: 222 10*3/uL (ref 150.0–400.0)
RBC: 4.11 Mil/uL — ABNORMAL LOW (ref 4.22–5.81)
RDW: 14.6 % (ref 11.5–15.5)
WBC: 5 10*3/uL (ref 4.0–10.5)

## 2017-12-30 MED ORDER — PANTOPRAZOLE SODIUM 40 MG PO TBEC: 40.0000 mg | DELAYED_RELEASE_TABLET | Freq: Every day | ORAL | 3 refills | Status: DC

## 2017-12-30 MED ORDER — METOPROLOL TARTRATE 25 MG PO TABS: 25.0000 mg | ORAL_TABLET | Freq: Two times a day (BID) | ORAL | 3 refills | Status: DC

## 2017-12-30 MED ORDER — LIDOCAINE 5 % EX OINT: 1.0000 "application " | TOPICAL_OINTMENT | CUTANEOUS | 1 refills | Status: AC | PRN

## 2017-12-30 MED ORDER — METHOCARBAMOL 750 MG PO TABS: 750.0000 mg | ORAL_TABLET | Freq: Two times a day (BID) | ORAL | 0 refills | Status: DC | PRN

## 2017-12-30 NOTE — Progress Notes (Signed)
Subjective:    Patient ID: [name], male    DOB: Apr 18, 1945, 72 y.o.   MRN: [id number]  HPI This is a 72 yo male, accompanied by his wife, who presents today for follow up of chronic medical conditions and recent discharge 12/23/17 from SNF. [name] was admitted to hospital 10/27/17- 11/16/17 with acute respiratory distress following surgery for acute fracture of left olecranon process.  He was discharged to SNF where he underwent PT/OT therapy until 12/24/17 when he was discharged home. He has been going to out patient PT.  He has not had any alcohol since hospitalization. Alcohol abuse has been an ongoing problem for him and today when he is asked if he plans to drink, he tells me that he is taking it "day by day."   Wife is concerned about [name]'s ability to drive safely.   ROS- left shoulder pain and upper arm pain, no elbow pain, no chest pain, some SOB with exertion. No falls since discharge from SNF. Not always using his cane indoors, usually uses outside. Good appetite, ate more at SNF- nothing else to do.   Past Medical History:  Diagnosis Date  . Adenomatous polyp of colon 2006  . Alcohol-induced persisting dementia ([place])   . Anemia 2012  . Anxiety   . Arthritis    "left arm/shoulder" (05/17/2016)  . Atrial tachycardia, paroxysmal ([place]) 04/07/2015  . BPH (benign prostatic hypertrophy) 5/99  . Chronic bronchitis ([place])    "q year or q other year" (11/02/2011)  . Cirrhosis of liver ([place])   . DCM (dilated cardiomyopathy) ([place])    EF 45-50% by echo and cath 2017  . Depression    Hx of  . Falls frequently    "daily lately; legs just give out" (11/02/2011; 05/17/2016)  . GERD (gastroesophageal reflux disease) 07/31/04   gastritis   . H/O hiatal hernia   . Hard of hearing   . Heart murmur   . Hernia, umbilical    "unrepaired" (11/02/2011; 05/17/2016)  . History of blood transfusion 2012  . HLD (hyperlipidemia) 5/99  . HTN (hypertension) 5/99  . Mild CAD    10% RCA  .  PAC (premature atrial contraction) 04/07/2015  . Peripheral vascular disease ([place])   . Pneumonia 1996   hosp  . PONV (postoperative nausea and vomiting)   . Seasonal allergies    pollen / ragweed  . Wears glasses    Past Surgical History:  Procedure Laterality Date  . BACK SURGERY    . CARDIAC CATHETERIZATION N/A 05/23/2015   Procedure: Left Heart Cath and Coronary Angiography;  Surgeon: Troy Sine, MD;  Location: [facility];  Service: Cardiovascular;  Laterality: N/A;  . CATARACT EXTRACTION W/ INTRAOCULAR LENS  IMPLANT, BILATERAL Bilateral 10/2002  . COLONOSCOPY  07/31/04   multiple polyps; repeat in 3 years  . DUPUYTREN CONTRACTURE RELEASE Left   . ETT myoview  03/09/07   nml EF 66%  . FRACTURE SURGERY    . HEMORRHOID SURGERY     "cauterized a long time ago" (11/02/2011)  . hosp CP R/O'D 2/24  03/08/07  . neck MRI  6/04   C5-6 disc abnormality  . ORIF ELBOW FRACTURE Left 10/27/2017   Procedure: OPEN REDUCTION INTERNAL FIXATION (ORIF) LEFT OLECRANON;  Surgeon: Leandrew Koyanagi, MD;  Location: [facility];  Service: Orthopedics;  Laterality: Left;  . ORIF HUMERUS FRACTURE  11/04/2011   Procedure: OPEN REDUCTION INTERNAL FIXATION (ORIF) PROXIMAL HUMERUS FRACTURE;  Surgeon: Astrid Divine  Marcelino Scot, MD;  Location: [facility];  Service: Orthopedics;  Laterality: Left;  . POSTERIOR CERVICAL FUSION/FORAMINOTOMY N/A 03/21/2012   Procedure: POSTERIOR CERVICAL FUSION/FORAMINOTOMY LEVEL ONE;  Surgeon: Charlie Pitter, MD;  Location: [facility];  Service: Neurosurgery;  Laterality: N/A;  POSTERIOR CERVICAL ONE TO CERVICAL TWO FUSION WITH ILIAC CREST GRAFT AND LATERAL MASS SCREWS   . TONSILLECTOMY     "I was young" (11/02/2011)  . TOTAL HIP ARTHROPLASTY Left 10/24/2016   Procedure: TOTAL HIP ARTHROPLASTY ANTERIOR APPROACH;  Surgeon: Rod Can, MD;  Location: [facility];  Service: Orthopedics;  Laterality: Left;  . UPPER GASTROINTESTINAL ENDOSCOPY     Family History  Problem Relation Age of Onset  . Heart  disease Father   . Diabetes Father   . Stroke Father   . Depression Mother   . Heart disease Brother   . Alcohol abuse Brother   . Liver disease Brother    Social History   Tobacco Use  . Smoking status: Former Smoker    Types: Pipe  . Smokeless tobacco: Never Used  . Tobacco comment: 05/17/2016 "stopped in the late 1990s"  Substance Use Topics  . Alcohol use: Yes    Alcohol/week: 28.0 standard drinks    Types: 28 Glasses of wine per week    Comment: last drink 10/22/17  . Drug use: No      Review of Systems Per HPI    Objective:   Physical Exam Vitals signs reviewed.  Constitutional:      Appearance: Normal appearance.  HENT:     Head: Normocephalic and atraumatic.     Mouth/Throat:     Mouth: Mucous membranes are moist.  Cardiovascular:     Rate and Rhythm: Normal rate and regular rhythm.     Heart sounds: Normal heart sounds.  Pulmonary:     Effort: Pulmonary effort is normal.  Musculoskeletal:        General: Swelling (1+ LE) present.  Skin:    General: Skin is warm and dry.  Neurological:     Mental Status: He is alert. Mental status is at baseline.  Psychiatric:        Mood and Affect: Mood normal.        Behavior: Behavior normal.       BP (!) 148/80 (BP Location: Right Arm, Patient Position: Sitting, Cuff Size: Normal)   Pulse 69   Temp 98.3 F (36.8 C) (Oral)   Ht 5\' 5"  (1.651 m)   Wt 203 lb (92.1 kg)   SpO2 96%   BMI 33.78 kg/m  Wt Readings from Last 3 Encounters:  12/30/17 203 lb (92.1 kg)  11/15/17 167 lb 15.9 oz (76.2 kg)  10/21/17 189 lb (85.7 kg)   Depression screen [facility] 2/9 12/30/2017 03/30/2017 07/08/2016 11/05/2015 10/24/2014  Decreased Interest 0 0 0 2 0  Down, Depressed, Hopeless 1 0 0 1 0  PHQ - 2 Score 1 0 0 3 0  Altered sleeping 3 - - 0 -  Tired, decreased energy 2 - - 2 -  Change in appetite 1 - - 2 -  Feeling bad or failure about yourself  0 - - 0 -  Trouble concentrating 3 - - 0 -  Moving slowly or fidgety/restless 1  - - 0 -  Suicidal thoughts 0 - - 0 -  PHQ-9 Score 11 - - 7 -  Difficult doing work/chores Not difficult at all - - Not difficult at all -  Some recent data might be  hidden   GAD 7 : Generalized Anxiety Score 12/30/2017  Nervous, Anxious, on Edge 1  Control/stop worrying 1  Worry too much - different things 2  Trouble relaxing 2  Restless 2  Easily annoyed or irritable 2  Afraid - awful might happen 2  Total GAD 7 Score 12        Assessment & Plan:  1. Alcoholic cirrhosis, unspecified whether ascites present ([place]) - Comprehensive metabolic panel  2. Deficiency anemia - CBC with Differential  3. Hyponatremia - Basic Metabolic Panel; Future  4. ETOH abuse - currently abstaining since hospitalization. Encouraged continued abstinence. He has not been interested in counseling/thearpy/support grouos  5. Frequent falls - encouraged him to use his cane at all times in the home - continue pt  6. Left arm pain - lidocaine (XYLOCAINE) 5 % ointment; Apply 1 application topically as needed.  Dispense: 50 g; Refill: 1  7. Essential hypertension - metoprolol tartrate (LOPRESSOR) 25 MG tablet; Take 1 tablet (25 mg total) by mouth 2 (two) times daily.  Dispense: 180 tablet; Refill: 3  8. Chronic left shoulder pain - lidocaine (XYLOCAINE) 5 % ointment; Apply 1 application topically as needed.  Dispense: 50 g; Refill: 1 - methocarbamol (ROBAXIN) 750 MG tablet; Take 1 tablet (750 mg total) by mouth 2 (two) times daily as needed for muscle spasms.  Dispense: 60 tablet; Refill: 0  9. Gastroesophageal reflux disease, esophagitis presence not specified - pantoprazole (PROTONIX) 40 MG tablet; Take 1 tablet (40 mg total) by mouth daily.  Dispense: 90 tablet; Refill: 3  - follow up in 2 months  [name], FNP-BC  [facility] at [facility], [place]  12/30/2017 5:46 PM

## 2017-12-30 NOTE — Telephone Encounter (Signed)
Patient's wife with concerns about patient driving. These are shared. I have submitted request for driver re-examination to [employer]. Sent info to patient's wife in result note dated today.

## 2017-12-30 NOTE — Telephone Encounter (Signed)
Daughter (maggy)  Filled out triage form with concerns she had  Put on cart to be delivered

## 2017-12-30 NOTE — Patient Instructions (Signed)
Good to see you today  Go to the lab  I strongly advise you to continue to abstain from all alcohol  I will send in refills of your medications and if your labs are ok, a low dose diuretic  Please follow up in 6-8 weeks for blood pressure check and labs

## 2018-01-02 DIAGNOSIS — M25512 Pain in left shoulder: Secondary | ICD-10-CM | POA: Diagnosis not present

## 2018-01-02 DIAGNOSIS — M25522 Pain in left elbow: Secondary | ICD-10-CM | POA: Diagnosis not present

## 2018-01-02 DIAGNOSIS — M6281 Muscle weakness (generalized): Secondary | ICD-10-CM | POA: Diagnosis not present

## 2018-01-02 DIAGNOSIS — Z4789 Encounter for other orthopedic aftercare: Secondary | ICD-10-CM | POA: Diagnosis not present

## 2018-01-06 DIAGNOSIS — M6281 Muscle weakness (generalized): Secondary | ICD-10-CM | POA: Diagnosis not present

## 2018-01-06 DIAGNOSIS — Z4789 Encounter for other orthopedic aftercare: Secondary | ICD-10-CM | POA: Diagnosis not present

## 2018-01-06 DIAGNOSIS — M25522 Pain in left elbow: Secondary | ICD-10-CM | POA: Diagnosis not present

## 2018-01-06 DIAGNOSIS — M25512 Pain in left shoulder: Secondary | ICD-10-CM | POA: Diagnosis not present

## 2018-01-09 DIAGNOSIS — M6281 Muscle weakness (generalized): Secondary | ICD-10-CM | POA: Diagnosis not present

## 2018-01-09 DIAGNOSIS — M25512 Pain in left shoulder: Secondary | ICD-10-CM | POA: Diagnosis not present

## 2018-01-09 DIAGNOSIS — M25522 Pain in left elbow: Secondary | ICD-10-CM | POA: Diagnosis not present

## 2018-01-09 DIAGNOSIS — Z4789 Encounter for other orthopedic aftercare: Secondary | ICD-10-CM | POA: Diagnosis not present

## 2018-01-10 ENCOUNTER — Telehealth: Payer: Self-pay | Admitting: Family Medicine

## 2018-01-10 ENCOUNTER — Other Ambulatory Visit (INDEPENDENT_AMBULATORY_CARE_PROVIDER_SITE_OTHER): Payer: Medicare Other

## 2018-01-10 DIAGNOSIS — E871 Hypo-osmolality and hyponatremia: Secondary | ICD-10-CM

## 2018-01-10 LAB — BASIC METABOLIC PANEL
BUN: 10 mg/dL (ref 6–23)
CALCIUM: 8.9 mg/dL (ref 8.4–10.5)
CO2: 30 meq/L (ref 19–32)
Chloride: 90 mEq/L — ABNORMAL LOW (ref 96–112)
Creatinine, Ser: 0.94 mg/dL (ref 0.40–1.50)
GFR: 83.99 mL/min (ref >60.00)
Glucose, Bld: 122 mg/dL — ABNORMAL HIGH (ref 70–99)
Potassium: 4.6 mEq/L (ref 3.5–5.1)
SODIUM: 127 meq/L — AB (ref 135–145)

## 2018-01-10 NOTE — Telephone Encounter (Signed)
Form placed on [name]'s Desk.

## 2018-01-10 NOTE — Telephone Encounter (Signed)
Patient dropped off form to be filled out for [facility]. Placed in [place] tower. Pt is also [facility] Pt would not say which prescriptions. He states Jackelyn Poling has a list of what he needs.

## 2018-01-12 NOTE — Telephone Encounter (Signed)
Please call patient and let him know that I sent in his prescriptions to his mail order pharmacy on 12/30/17. He should check with them to make sure they are on the way.

## 2018-01-13 ENCOUNTER — Encounter: Payer: Self-pay | Admitting: Family Medicine

## 2018-01-13 DIAGNOSIS — M6281 Muscle weakness (generalized): Secondary | ICD-10-CM | POA: Diagnosis not present

## 2018-01-13 DIAGNOSIS — Z4789 Encounter for other orthopedic aftercare: Secondary | ICD-10-CM | POA: Diagnosis not present

## 2018-01-13 DIAGNOSIS — M25512 Pain in left shoulder: Secondary | ICD-10-CM | POA: Diagnosis not present

## 2018-01-13 DIAGNOSIS — M25522 Pain in left elbow: Secondary | ICD-10-CM | POA: Diagnosis not present

## 2018-01-13 NOTE — Addendum Note (Signed)
Addended by: [name] B on: 01/13/2018 02:16 PM   Modules accepted: Orders

## 2018-01-13 NOTE — Telephone Encounter (Signed)
Left message on VM per DPR about meds.

## 2018-01-16 ENCOUNTER — Ambulatory Visit (INDEPENDENT_AMBULATORY_CARE_PROVIDER_SITE_OTHER): Payer: Medicare Other | Admitting: Family Medicine

## 2018-01-16 ENCOUNTER — Ambulatory Visit (INDEPENDENT_AMBULATORY_CARE_PROVIDER_SITE_OTHER)
Admission: RE | Admit: 2018-01-16 | Discharge: 2018-01-16 | Disposition: A | Discharge status: Home / Self Care | Payer: Medicare Other | Source: Ambulatory Visit | Attending: Family Medicine | Admitting: Family Medicine

## 2018-01-16 ENCOUNTER — Encounter: Payer: Self-pay | Admitting: Family Medicine

## 2018-01-16 VITALS — BP 168/100 | HR 100 | Temp 98.6°F | Ht 65.0 in | Wt 215.0 lb

## 2018-01-16 DIAGNOSIS — R0609 Other forms of dyspnea: Secondary | ICD-10-CM

## 2018-01-16 DIAGNOSIS — M7989 Other specified soft tissue disorders: Secondary | ICD-10-CM

## 2018-01-16 DIAGNOSIS — E871 Hypo-osmolality and hyponatremia: Secondary | ICD-10-CM

## 2018-01-16 DIAGNOSIS — I499 Cardiac arrhythmia, unspecified: Secondary | ICD-10-CM

## 2018-01-16 MED ORDER — FUROSEMIDE 20 MG PO TABS: 20.0000 mg | ORAL_TABLET | Freq: Every day | ORAL | 3 refills | Status: DC

## 2018-01-16 NOTE — Progress Notes (Signed)
Subjective:    Patient ID: [name], male    DOB: April 29, 1945, 73 y.o.   MRN: [id number]  HPI This is a 73 yo male, brought in by his brother in law, who presents with increased DOB. Has noticed over last week. Has had swelling of his legs. Has noticed wheezing, worse with activity. He denies cough, fever, runny nose, sore throat. No chest pain. Found to have low sodium at last visit, subsequent recheck low as well. Patient has decreased water intake, drinking gatorade. Reports good urination with light yellow urine. Patient with history of dilated cardiomyopathy and cirrhosis of the liver. Currently on metoprolol 25 mg bid.   Continues to have painful left shoulder. Chronic limited ROM. Left elbow with recent fracture, he denies pain- has follow up with ortho.   Past Medical History:  Diagnosis Date  . Adenomatous polyp of colon 2006  . Alcohol-induced persisting dementia ([place])   . Anemia 2012  . Anxiety   . Arthritis    "left arm/shoulder" (05/17/2016)  . Atrial tachycardia, paroxysmal ([place]) 04/07/2015  . BPH (benign prostatic hypertrophy) 5/99  . Chronic bronchitis ([place])    "q year or q other year" (11/02/2011)  . Cirrhosis of liver ([place])   . DCM (dilated cardiomyopathy) ([place])    EF 45-50% by echo and cath 2017  . Depression    Hx of  . Falls frequently    "daily lately; legs just give out" (11/02/2011; 05/17/2016)  . GERD (gastroesophageal reflux disease) 07/31/04   gastritis   . H/O hiatal hernia   . Hard of hearing   . Heart murmur   . Hernia, umbilical    "unrepaired" (11/02/2011; 05/17/2016)  . History of blood transfusion 2012  . HLD (hyperlipidemia) 5/99  . HTN (hypertension) 5/99  . Mild CAD    10% RCA  . PAC (premature atrial contraction) 04/07/2015  . Peripheral vascular disease ([place])   . Pneumonia 1996   hosp  . PONV (postoperative nausea and vomiting)   . Seasonal allergies    pollen / ragweed  . Wears glasses    Past Surgical History:  Procedure  Laterality Date  . BACK SURGERY    . CARDIAC CATHETERIZATION N/A 05/23/2015   Procedure: Left Heart Cath and Coronary Angiography;  Surgeon: Troy Sine, MD;  Location: [facility];  Service: Cardiovascular;  Laterality: N/A;  . CATARACT EXTRACTION W/ INTRAOCULAR LENS  IMPLANT, BILATERAL Bilateral 10/2002  . COLONOSCOPY  07/31/04   multiple polyps; repeat in 3 years  . DUPUYTREN CONTRACTURE RELEASE Left   . ETT myoview  03/09/07   nml EF 66%  . FRACTURE SURGERY    . HEMORRHOID SURGERY     "cauterized a long time ago" (11/02/2011)  . hosp CP R/O'D 2/24  03/08/07  . neck MRI  6/04   C5-6 disc abnormality  . ORIF ELBOW FRACTURE Left 10/27/2017   Procedure: OPEN REDUCTION INTERNAL FIXATION (ORIF) LEFT OLECRANON;  Surgeon: Leandrew Koyanagi, MD;  Location: [facility];  Service: Orthopedics;  Laterality: Left;  . ORIF HUMERUS FRACTURE  11/04/2011   Procedure: OPEN REDUCTION INTERNAL FIXATION (ORIF) PROXIMAL HUMERUS FRACTURE;  Surgeon: Rozanna Box, MD;  Location: [facility];  Service: Orthopedics;  Laterality: Left;  . POSTERIOR CERVICAL FUSION/FORAMINOTOMY N/A 03/21/2012   Procedure: POSTERIOR CERVICAL FUSION/FORAMINOTOMY LEVEL ONE;  Surgeon: Charlie Pitter, MD;  Location: [facility];  Service: Neurosurgery;  Laterality: N/A;  POSTERIOR CERVICAL ONE TO CERVICAL TWO FUSION WITH ILIAC  CREST GRAFT AND LATERAL MASS SCREWS   . TONSILLECTOMY     "I was young" (11/02/2011)  . TOTAL HIP ARTHROPLASTY Left 10/24/2016   Procedure: TOTAL HIP ARTHROPLASTY ANTERIOR APPROACH;  Surgeon: Rod Can, MD;  Location: [facility];  Service: Orthopedics;  Laterality: Left;  . UPPER GASTROINTESTINAL ENDOSCOPY     Family History  Problem Relation Age of Onset  . Heart disease Father   . Diabetes Father   . Stroke Father   . Depression Mother   . Heart disease Brother   . Alcohol abuse Brother   . Liver disease Brother    Social History   Tobacco Use  . Smoking status: Former Smoker    Types: Pipe  . Smokeless  tobacco: Never Used  . Tobacco comment: 05/17/2016 "stopped in the late 1990s"  Substance Use Topics  . Alcohol use: Yes    Alcohol/week: 28.0 standard drinks    Types: 28 Glasses of wine per week    Comment: last drink 10/22/17  . Drug use: No      Review of Systems     Objective:   Physical Exam Vitals signs reviewed.  Constitutional:      Appearance: He is obese. He is ill-appearing.  HENT:     Head: Normocephalic and atraumatic.  Cardiovascular:     Rate and Rhythm: Tachycardia present.     Heart sounds: No murmur.     Comments: Irregular rhythm on auscultation.  Pulmonary:     Comments: Slightly decreased breath sounds posteriorly. Dyspneic with ambulation from wheelchair to exam table.   Musculoskeletal:        General: Swelling (2+ non pitting, LE edema) present.  Skin:    General: Skin is warm and dry.  Neurological:     Mental Status: He is alert. Mental status is at baseline.     Comments: Answers questions appropriately.   Psychiatric:        Mood and Affect: Mood normal.        Behavior: Behavior normal.       BP (!) 168/100   Pulse 100   Temp 98.6 F (37 C) (Oral)   Ht 5\' 5"  (1.651 m)   Wt 215 lb (97.5 kg)   SpO2 96%   BMI 35.78 kg/m  Wt Readings from Last 3 Encounters:  01/16/18 215 lb (97.5 kg)  12/30/17 203 lb (92.1 kg)  11/15/17 167 lb 15.9 oz (76.2 kg)   EKG- sinus tachycardia, baseline artifact Reviewed with Dr. Damita Dunnings    Assessment & Plan:  Discussed with Dr. Damita Dunnings who reviewed EKG 1. DOE (dyspnea on exertion) - patient with several chronic medical conditions that could be contributing (cardiomyopathy, cirrhosis) - Will get labs and CXR, add furosemide 20 mg, increase metoprolol from 25 mg to 50 mg BID - will follow up via telephone tomorrow and have him come in for recheck in 4 days - EKG 12-Lead - Brain natriuretic peptide - DG Chest 2 View; Future - Comprehensive metabolic panel - CBC with Differential - DG Chest 2 View -  furosemide (LASIX) 20 MG tablet; Take 1 tablet (20 mg total) by mouth daily.  Dispense: 30 tablet; Refill: 3  2. Irregular heartbeat - EKG shows ST - EKG 12-Lead - Brain natriuretic peptide - Comprehensive metabolic panel - CBC with Differential  3. Hyponatremia - Sodium, urine, random - Osmolality - Basic Metabolic Panel - Comprehensive metabolic panel - CBC with Differential  4. Leg swelling - furosemide (LASIX) 20 MG tablet;  Take 1 tablet (20 mg total) by mouth daily.  Dispense: 30 tablet; Refill: [place], FNP-BC  [facility] at [facility], [place] [facility]  01/16/2018 9:07 PM

## 2018-01-16 NOTE — Patient Instructions (Addendum)
Please increase your metoprolol to two tablets twice a day  I have sent in a fluid pill. Take one this afternoon and one with lunch tomorrow, then take every day in the morning  We will call you tomorrow with your lab results and see how you are doing  Follow up on Friday

## 2018-01-17 ENCOUNTER — Inpatient Hospital Stay (HOSPITAL_COMMUNITY)
Admission: EM | Admit: 2018-01-17 | Discharge: 2018-01-21 | DRG: 641 | Disposition: A | Discharge status: Home / Self Care | Payer: Medicare Other | Attending: Internal Medicine | Admitting: Internal Medicine

## 2018-01-17 ENCOUNTER — Telehealth: Payer: Self-pay | Admitting: Radiology

## 2018-01-17 ENCOUNTER — Inpatient Hospital Stay (HOSPITAL_COMMUNITY): Payer: Medicare Other

## 2018-01-17 ENCOUNTER — Encounter (HOSPITAL_COMMUNITY): Payer: Self-pay | Admitting: Emergency Medicine

## 2018-01-17 DIAGNOSIS — E871 Hypo-osmolality and hyponatremia: Secondary | ICD-10-CM | POA: Diagnosis not present

## 2018-01-17 DIAGNOSIS — R4702 Dysphasia: Secondary | ICD-10-CM | POA: Diagnosis present

## 2018-01-17 DIAGNOSIS — Z791 Long term (current) use of non-steroidal anti-inflammatories (NSAID): Secondary | ICD-10-CM

## 2018-01-17 DIAGNOSIS — M7989 Other specified soft tissue disorders: Secondary | ICD-10-CM

## 2018-01-17 DIAGNOSIS — R4182 Altered mental status, unspecified: Secondary | ICD-10-CM | POA: Diagnosis not present

## 2018-01-17 DIAGNOSIS — D509 Iron deficiency anemia, unspecified: Secondary | ICD-10-CM | POA: Diagnosis present

## 2018-01-17 DIAGNOSIS — R1310 Dysphagia, unspecified: Secondary | ICD-10-CM | POA: Diagnosis present

## 2018-01-17 DIAGNOSIS — D638 Anemia in other chronic diseases classified elsewhere: Secondary | ICD-10-CM | POA: Diagnosis present

## 2018-01-17 DIAGNOSIS — Z981 Arthrodesis status: Secondary | ICD-10-CM

## 2018-01-17 DIAGNOSIS — F419 Anxiety disorder, unspecified: Secondary | ICD-10-CM | POA: Diagnosis present

## 2018-01-17 DIAGNOSIS — E8770 Fluid overload, unspecified: Secondary | ICD-10-CM | POA: Diagnosis present

## 2018-01-17 DIAGNOSIS — R0602 Shortness of breath: Secondary | ICD-10-CM | POA: Diagnosis not present

## 2018-01-17 DIAGNOSIS — E162 Hypoglycemia, unspecified: Secondary | ICD-10-CM | POA: Diagnosis present

## 2018-01-17 DIAGNOSIS — R601 Generalized edema: Secondary | ICD-10-CM | POA: Diagnosis not present

## 2018-01-17 DIAGNOSIS — H9190 Unspecified hearing loss, unspecified ear: Secondary | ICD-10-CM | POA: Diagnosis present

## 2018-01-17 DIAGNOSIS — Z87891 Personal history of nicotine dependence: Secondary | ICD-10-CM

## 2018-01-17 DIAGNOSIS — G9341 Metabolic encephalopathy: Secondary | ICD-10-CM

## 2018-01-17 DIAGNOSIS — Z96642 Presence of left artificial hip joint: Secondary | ICD-10-CM | POA: Diagnosis present

## 2018-01-17 DIAGNOSIS — Z8249 Family history of ischemic heart disease and other diseases of the circulatory system: Secondary | ICD-10-CM

## 2018-01-17 DIAGNOSIS — R0609 Other forms of dyspnea: Secondary | ICD-10-CM | POA: Diagnosis not present

## 2018-01-17 DIAGNOSIS — R410 Disorientation, unspecified: Secondary | ICD-10-CM | POA: Diagnosis not present

## 2018-01-17 DIAGNOSIS — I10 Essential (primary) hypertension: Secondary | ICD-10-CM | POA: Diagnosis not present

## 2018-01-17 DIAGNOSIS — Z7982 Long term (current) use of aspirin: Secondary | ICD-10-CM

## 2018-01-17 DIAGNOSIS — K219 Gastro-esophageal reflux disease without esophagitis: Secondary | ICD-10-CM | POA: Diagnosis present

## 2018-01-17 DIAGNOSIS — D649 Anemia, unspecified: Secondary | ICD-10-CM | POA: Diagnosis present

## 2018-01-17 DIAGNOSIS — N400 Enlarged prostate without lower urinary tract symptoms: Secondary | ICD-10-CM | POA: Diagnosis present

## 2018-01-17 DIAGNOSIS — Z833 Family history of diabetes mellitus: Secondary | ICD-10-CM

## 2018-01-17 DIAGNOSIS — K7031 Alcoholic cirrhosis of liver with ascites: Secondary | ICD-10-CM

## 2018-01-17 DIAGNOSIS — F329 Major depressive disorder, single episode, unspecified: Secondary | ICD-10-CM | POA: Diagnosis present

## 2018-01-17 DIAGNOSIS — Z818 Family history of other mental and behavioral disorders: Secondary | ICD-10-CM

## 2018-01-17 DIAGNOSIS — R296 Repeated falls: Secondary | ICD-10-CM | POA: Diagnosis present

## 2018-01-17 DIAGNOSIS — Z811 Family history of alcohol abuse and dependence: Secondary | ICD-10-CM

## 2018-01-17 DIAGNOSIS — F1010 Alcohol abuse, uncomplicated: Secondary | ICD-10-CM | POA: Diagnosis not present

## 2018-01-17 DIAGNOSIS — I2510 Atherosclerotic heart disease of native coronary artery without angina pectoris: Secondary | ICD-10-CM | POA: Diagnosis present

## 2018-01-17 DIAGNOSIS — E785 Hyperlipidemia, unspecified: Secondary | ICD-10-CM | POA: Diagnosis present

## 2018-01-17 DIAGNOSIS — K7040 Alcoholic hepatic failure without coma: Secondary | ICD-10-CM | POA: Diagnosis present

## 2018-01-17 DIAGNOSIS — E876 Hypokalemia: Secondary | ICD-10-CM | POA: Diagnosis present

## 2018-01-17 DIAGNOSIS — Z888 Allergy status to other drugs, medicaments and biological substances status: Secondary | ICD-10-CM | POA: Diagnosis not present

## 2018-01-17 DIAGNOSIS — Z66 Do not resuscitate: Secondary | ICD-10-CM | POA: Diagnosis present

## 2018-01-17 DIAGNOSIS — Z823 Family history of stroke: Secondary | ICD-10-CM

## 2018-01-17 DIAGNOSIS — Z86010 Personal history of colonic polyps: Secondary | ICD-10-CM

## 2018-01-17 DIAGNOSIS — Z79899 Other long term (current) drug therapy: Secondary | ICD-10-CM

## 2018-01-17 DIAGNOSIS — Z91030 Bee allergy status: Secondary | ICD-10-CM | POA: Diagnosis not present

## 2018-01-17 DIAGNOSIS — K7030 Alcoholic cirrhosis of liver without ascites: Secondary | ICD-10-CM | POA: Diagnosis present

## 2018-01-17 DIAGNOSIS — K7290 Hepatic failure, unspecified without coma: Secondary | ICD-10-CM | POA: Diagnosis not present

## 2018-01-17 DIAGNOSIS — F1011 Alcohol abuse, in remission: Secondary | ICD-10-CM | POA: Diagnosis present

## 2018-01-17 DIAGNOSIS — Z7951 Long term (current) use of inhaled steroids: Secondary | ICD-10-CM

## 2018-01-17 LAB — COMPREHENSIVE METABOLIC PANEL
ALK PHOS: 62 U/L (ref 38–126)
ALT: 10 U/L (ref 0–53)
ALT: 13 U/L (ref 0–44)
AST: 24 U/L (ref 0–37)
AST: 30 U/L (ref 15–41)
Albumin: 4 g/dL (ref 3.5–5.2)
Albumin: 3.8 g/dL (ref 3.5–5.0)
Alkaline Phosphatase: 63 U/L (ref 39–117)
Anion gap: 12 (ref 5–15)
BILIRUBIN TOTAL: 0.9 mg/dL (ref 0.3–1.2)
BUN: 6 mg/dL (ref 6–23)
BUN: 5 mg/dL — ABNORMAL LOW (ref 8–23)
CALCIUM: 8.3 mg/dL — AB (ref 8.9–10.3)
CO2: 29 mEq/L (ref 19–32)
CO2: 25 mmol/L (ref 22–32)
CREATININE: 0.87 mg/dL (ref 0.61–1.24)
Calcium: 8.9 mg/dL (ref 8.4–10.5)
Chloride: 85 mEq/L — ABNORMAL LOW (ref 96–112)
Chloride: 84 mmol/L — ABNORMAL LOW (ref 98–111)
Creatinine, Ser: 0.79 mg/dL (ref 0.40–1.50)
GFR calc Af Amer: >60 mL/min (ref >60)
GFR calc non Af Amer: >60 mL/min (ref >60)
GFR: 102.64 mL/min (ref >60.00)
Glucose, Bld: 83 mg/dL (ref 70–99)
Glucose, Bld: 75 mg/dL (ref 70–99)
Potassium: 4.3 mEq/L (ref 3.5–5.1)
Potassium: 3.8 mmol/L (ref 3.5–5.1)
SODIUM: 121 meq/L — AB (ref 135–145)
Sodium: 121 mmol/L — ABNORMAL LOW (ref 135–145)
Total Bilirubin: 0.8 mg/dL (ref 0.2–1.2)
Total Protein: 6.5 g/dL (ref 6.0–8.3)
Total Protein: 6.8 g/dL (ref 6.5–8.1)

## 2018-01-17 LAB — CBC
HCT: 35.2 % — ABNORMAL LOW (ref 39.0–52.0)
Hemoglobin: 11.9 g/dL — ABNORMAL LOW (ref 13.0–17.0)
MCH: 28.9 pg (ref 26.0–34.0)
MCHC: 33.8 g/dL (ref 30.0–36.0)
MCV: 85.4 fL (ref 80.0–100.0)
PLATELETS: 151 10*3/uL (ref 150–400)
RBC: 4.12 MIL/uL — ABNORMAL LOW (ref 4.22–5.81)
RDW: 14.6 % (ref 11.5–15.5)
WBC: 6.1 10*3/uL (ref 4.0–10.5)
nRBC: 0 % (ref 0.0–0.2)

## 2018-01-17 LAB — CBG MONITORING, ED
GLUCOSE-CAPILLARY: 47 mg/dL — AB (ref 70–99)
Glucose-Capillary: 77 mg/dL (ref 70–99)

## 2018-01-17 LAB — CBC WITH DIFFERENTIAL/PLATELET
BASOS PCT: 1.1 % (ref 0.0–3.0)
Basophils Absolute: 0.1 10*3/uL (ref 0.0–0.1)
EOS ABS: 0.1 10*3/uL (ref 0.0–0.7)
Eosinophils Relative: 1.1 % (ref 0.0–5.0)
HEMATOCRIT: 35.2 % — AB (ref 39.0–52.0)
Hemoglobin: 12 g/dL — ABNORMAL LOW (ref 13.0–17.0)
Lymphocytes Relative: 8 % — ABNORMAL LOW (ref 12.0–46.0)
Lymphs Abs: 0.6 10*3/uL — ABNORMAL LOW (ref 0.7–4.0)
MCHC: 34.1 g/dL (ref 30.0–36.0)
MCV: 85.1 fl (ref 78.0–100.0)
Monocytes Absolute: 0.9 10*3/uL (ref 0.1–1.0)
Monocytes Relative: 11.8 % (ref 3.0–12.0)
NEUTROS ABS: 6 10*3/uL (ref 1.4–7.7)
Neutrophils Relative %: 78 % — ABNORMAL HIGH (ref 43.0–77.0)
PLATELETS: 141 10*3/uL — AB (ref 150.0–400.0)
RBC: 4.14 Mil/uL — ABNORMAL LOW (ref 4.22–5.81)
RDW: 15 % (ref 11.5–15.5)
WBC: 7.8 10*3/uL (ref 4.0–10.5)

## 2018-01-17 LAB — AMMONIA: Ammonia: 47 umol/L — ABNORMAL HIGH (ref 9–35)

## 2018-01-17 LAB — BRAIN NATRIURETIC PEPTIDE: Pro B Natriuretic peptide (BNP): 547 pg/mL — ABNORMAL HIGH (ref 0.0–100.0)

## 2018-01-17 MED ORDER — ENOXAPARIN SODIUM 40 MG/0.4ML ~~LOC~~ SOLN
40.0000 mg | Freq: Every day | SUBCUTANEOUS | Status: DC
  Administered 2018-01-18 – 2018-01-21 (×4): 40 mg via SUBCUTANEOUS
  Filled 2018-01-17 (×4): qty 0.4

## 2018-01-17 MED ORDER — DEXTROSE 50 % IV SOLN
50.0000 mL | Freq: Once | INTRAVENOUS | Status: AC
  Administered 2018-01-17: 50 mL via INTRAVENOUS
  Filled 2018-01-17: qty 50

## 2018-01-17 MED ORDER — LACTULOSE 10 GM/15ML PO SOLN
20.0000 g | Freq: Two times a day (BID) | ORAL | Status: DC
  Administered 2018-01-18: 20 g via ORAL
  Filled 2018-01-17 (×4): qty 30

## 2018-01-17 MED ORDER — SENNOSIDES-DOCUSATE SODIUM 8.6-50 MG PO TABS: 1.0000 | ORAL_TABLET | Freq: Every evening | ORAL | Status: DC | PRN

## 2018-01-17 MED ORDER — ACETAMINOPHEN 325 MG PO TABS
650.0000 mg | ORAL_TABLET | Freq: Four times a day (QID) | ORAL | Status: DC | PRN
  Filled 2018-01-17: qty 2

## 2018-01-17 MED ORDER — DEXTROSE 50 % IV SOLN
INTRAVENOUS | Status: AC
  Administered 2018-01-17: 50 mL via INTRAVENOUS
  Filled 2018-01-17: qty 50

## 2018-01-17 MED ORDER — FUROSEMIDE 10 MG/ML IJ SOLN: 40.0000 mg | Freq: Once | INTRAMUSCULAR | Status: DC

## 2018-01-17 MED ORDER — DM-GUAIFENESIN ER 30-600 MG PO TB12: 1.0000 | ORAL_TABLET | Freq: Two times a day (BID) | ORAL | Status: DC | PRN

## 2018-01-17 MED ORDER — ONDANSETRON HCL 4 MG PO TABS: 4.0000 mg | ORAL_TABLET | Freq: Four times a day (QID) | ORAL | Status: DC | PRN

## 2018-01-17 MED ORDER — ALBUTEROL SULFATE (2.5 MG/3ML) 0.083% IN NEBU: 2.5000 mg | INHALATION_SOLUTION | RESPIRATORY_TRACT | Status: DC | PRN

## 2018-01-17 MED ORDER — DEXTROSE 50 % IV SOLN: 1.0000 | Freq: Once | INTRAVENOUS | Status: DC

## 2018-01-17 MED ORDER — IPRATROPIUM-ALBUTEROL 0.5-2.5 (3) MG/3ML IN SOLN
3.0000 mL | Freq: Four times a day (QID) | RESPIRATORY_TRACT | Status: DC
  Administered 2018-01-18 (×2): 3 mL via RESPIRATORY_TRACT
  Filled 2018-01-17 (×2): qty 3

## 2018-01-17 MED ORDER — HYDRALAZINE HCL 20 MG/ML IJ SOLN: 5.0000 mg | INTRAMUSCULAR | Status: DC | PRN

## 2018-01-17 MED ORDER — ACETAMINOPHEN 650 MG RE SUPP: 650.0000 mg | Freq: Four times a day (QID) | RECTAL | Status: DC | PRN

## 2018-01-17 MED ORDER — ONDANSETRON HCL 4 MG/2ML IJ SOLN
4.0000 mg | Freq: Four times a day (QID) | INTRAMUSCULAR | Status: DC | PRN
  Administered 2018-01-18: 4 mg via INTRAVENOUS
  Filled 2018-01-17: qty 2

## 2018-01-17 NOTE — Telephone Encounter (Signed)
Call pt:  Sodium low. Any s/s confusion, weakness? At this point, I would recommend ER eval because sodium is so low.

## 2018-01-17 NOTE — Telephone Encounter (Signed)
Spoke to pts wife Burman Nieves and advised of results and advice. States that pt has been slightly confused and will take him to the ED for an evaluation

## 2018-01-17 NOTE — H&P (Signed)
History and Physical    [name] PXT:[id number] DOB: 10/31/1945 DOA: 01/17/2018  Referring MD/NP/PA:   PCP: Elby Beck, FNP   Patient coming from:  The patient is coming from home.  At baseline, pt is partially dependent for most of ADL.        Chief Complaint: AMS, abnormal lab and shortness of breath,  HPI: [name] is a 73 y.o. male with medical history significant of alcohol abuse in remission, alcoholic liver cirrhosis, alcoholic dementia, hypertension, hyperlipidemia, GERD, depression, iron deficiency anemia, BPH, paroxysmal atrial tachycardia, who presents with altered mental status, abnormal lab, shortness breath.  Per patient's wife, patient has history of alcoholic dementia, but at her normal baseline, patient is oriented x3 and is interactive.  In the past several days, he becomes more confused, less interactive.  When I see patient in the ED, patient is mildly confused, but still oriented x3.  He has shortness of breath on exertion, but no chest pain, fever or chills. Wife states that pt has wheezing when he walks. He has mild dry cough.  No nausea, vomiting, diarrhea or abdominal pain denies symptoms of UTI or unilateral weakness.  Patient was seen by PCP yesterday, found to have hyponatremia with sodium 125.  He had negative chest x-ray yesterday. Per patient's wife, patient did not drink alcohol since October 2019.  Patient worsening fluid retention, with anasarca.  Patient was started with Lasix by PCP.  He took 1 pills of 20 mg Lasix yesterday.  ED Course: pt was found to have sodium 121, WBC 6.1, renal function normal, ammonia 47, temperature normal heart rate 80s, no tachypnea, oxygen saturation 96 to 100% on room air.  CT head is negative for acute intracranial abnormalities.  Patient is admitted to telemetry bed as inpatient.  Review of Systems:   General: no fevers, chills, no body weight gain, has fatigue HEENT: no blurry vision, hearing changes or sore  throat Respiratory: has dyspnea, coughing, wheezing CV: no chest pain, no palpitations GI: no nausea, vomiting, abdominal pain, diarrhea, constipation GU: no dysuria, burning on urination, increased urinary frequency, hematuria  Ext: has leg edema Neuro: no unilateral weakness, numbness, or tingling, no vision change or hearing loss. Has confusion. Skin: no rash, no skin tear. MSK: No muscle spasm, no deformity, no limitation of range of movement in spin Heme: No easy bruising.  Travel history: No recent long distant travel.  Allergy:  Allergies  Allergen Reactions  . Nifedipine Swelling    11/02/2011 pt denies this allergy (??)  . Bee Venom Swelling    Past Medical History:  Diagnosis Date  . Adenomatous polyp of colon 2006  . Alcohol-induced persisting dementia ([place])   . Anemia 2012  . Anxiety   . Arthritis    "left arm/shoulder" (05/17/2016)  . Atrial tachycardia, paroxysmal ([place]) 04/07/2015  . BPH (benign prostatic hypertrophy) 5/99  . Chronic bronchitis ([place])    "q year or q other year" (11/02/2011)  . Cirrhosis of liver ([place])   . DCM (dilated cardiomyopathy) ([place])    EF 45-50% by echo and cath 2017  . Depression    Hx of  . Falls frequently    "daily lately; legs just give out" (11/02/2011; 05/17/2016)  . GERD (gastroesophageal reflux disease) 07/31/04   gastritis   . H/O hiatal hernia   . Hard of hearing   . Heart murmur   . Hernia, umbilical    "unrepaired" (11/02/2011; 05/17/2016)  . History of blood transfusion  2012  . HLD (hyperlipidemia) 5/99  . HTN (hypertension) 5/99  . Mild CAD    10% RCA  . PAC (premature atrial contraction) 04/07/2015  . Peripheral vascular disease ([place])   . Pneumonia 1996   hosp  . PONV (postoperative nausea and vomiting)   . Seasonal allergies    pollen / ragweed  . Wears glasses     Past Surgical History:  Procedure Laterality Date  . BACK SURGERY    . CARDIAC CATHETERIZATION N/A 05/23/2015   Procedure: Left Heart Cath and  Coronary Angiography;  Surgeon: Troy Sine, MD;  Location: [facility];  Service: Cardiovascular;  Laterality: N/A;  . CATARACT EXTRACTION W/ INTRAOCULAR LENS  IMPLANT, BILATERAL Bilateral 10/2002  . COLONOSCOPY  07/31/04   multiple polyps; repeat in 3 years  . DUPUYTREN CONTRACTURE RELEASE Left   . ETT myoview  03/09/07   nml EF 66%  . FRACTURE SURGERY    . HEMORRHOID SURGERY     "cauterized a long time ago" (11/02/2011)  . hosp CP R/O'D 2/24  03/08/07  . neck MRI  6/04   C5-6 disc abnormality  . ORIF ELBOW FRACTURE Left 10/27/2017   Procedure: OPEN REDUCTION INTERNAL FIXATION (ORIF) LEFT OLECRANON;  Surgeon: Leandrew Koyanagi, MD;  Location: [facility];  Service: Orthopedics;  Laterality: Left;  . ORIF HUMERUS FRACTURE  11/04/2011   Procedure: OPEN REDUCTION INTERNAL FIXATION (ORIF) PROXIMAL HUMERUS FRACTURE;  Surgeon: Rozanna Box, MD;  Location: [facility];  Service: Orthopedics;  Laterality: Left;  . POSTERIOR CERVICAL FUSION/FORAMINOTOMY N/A 03/21/2012   Procedure: POSTERIOR CERVICAL FUSION/FORAMINOTOMY LEVEL ONE;  Surgeon: Charlie Pitter, MD;  Location: [facility];  Service: Neurosurgery;  Laterality: N/A;  POSTERIOR CERVICAL ONE TO CERVICAL TWO FUSION WITH ILIAC CREST GRAFT AND LATERAL MASS SCREWS   . TONSILLECTOMY     "I was young" (11/02/2011)  . TOTAL HIP ARTHROPLASTY Left 10/24/2016   Procedure: TOTAL HIP ARTHROPLASTY ANTERIOR APPROACH;  Surgeon: Rod Can, MD;  Location: [facility];  Service: Orthopedics;  Laterality: Left;  . UPPER GASTROINTESTINAL ENDOSCOPY      Social History:  reports that he has quit smoking. His smoking use included pipe. He has never used smokeless tobacco. He reports current alcohol use of about 28.0 standard drinks of alcohol per week. He reports that he does not use drugs.  Family History:  Family History  Problem Relation Age of Onset  . Heart disease Father   . Diabetes Father   . Stroke Father   . Depression Mother   . Heart disease Brother     . Alcohol abuse Brother   . Liver disease Brother      Prior to Admission medications   Medication Sig Start Date End Date Taking? Authorizing Provider  aspirin EC 325 MG EC tablet Take 1 tablet (325 mg total) by mouth daily. 11/17/17   Donne Hazel, MD  cetirizine (ZYRTEC) 10 MG tablet Take 1 tablet (10 mg total) by mouth daily. 10/03/17   Elby Beck, FNP  Cyanocobalamin (VITAMIN B-12 PO) Take 1 tablet by mouth daily.    [provider]  DULoxetine (CYMBALTA) 30 MG capsule  12/14/17   [provider]  ferrous sulfate 325 (65 FE) MG tablet Take 325 mg by mouth daily as needed (low iron).     [provider]  fluticasone (FLONASE) 50 MCG/ACT nasal spray Place 1 spray into both nostrils 2 (two) times daily as needed for allergies. 06/22/17  Elby Beck, FNP  folic acid (FOLVITE) 1 MG tablet Take 1 tablet (1 mg total) by mouth daily. 10/31/16   Isaac Bliss, Rayford Halsted, MD  furosemide (LASIX) 20 MG tablet Take 1 tablet (20 mg total) by mouth daily. 01/16/18   Elby Beck, FNP  ibuprofen (ADVIL,MOTRIN) 400 MG tablet Take 1 tablet (400 mg total) by mouth every 6 (six) hours as needed for moderate pain (neck pain). 04/11/17   Shelly Coss, MD  lidocaine (XYLOCAINE) 5 % ointment Apply 1 application topically as needed. 12/30/17   Elby Beck, FNP  magnesium oxide (MAG-OX) 400 (241.3 Mg) MG tablet Take 1 tablet (400 mg total) by mouth daily. 04/12/17   Shelly Coss, MD  methocarbamol (ROBAXIN) 750 MG tablet Take 1 tablet (750 mg total) by mouth 2 (two) times daily as needed for muscle spasms. 12/30/17   Elby Beck, FNP  metoprolol tartrate (LOPRESSOR) 25 MG tablet Take 1 tablet (25 mg total) by mouth 2 (two) times daily. 12/30/17 01/29/18  Elby Beck, FNP  Multiple Vitamin (MULTIVITAMIN WITH MINERALS) TABS tablet Take 1 tablet by mouth daily. 10/31/16   Isaac Bliss, Rayford Halsted, MD  ondansetron (ZOFRAN) 4 MG tablet Take 1-2  tablets (4-8 mg total) by mouth every 8 (eight) hours as needed for nausea or vomiting. 10/27/17   Leandrew Koyanagi, MD  pantoprazole (PROTONIX) 40 MG tablet Take 1 tablet (40 mg total) by mouth daily. 12/30/17   Elby Beck, FNP  promethazine (PHENERGAN) 25 MG tablet Take 1 tablet (25 mg total) by mouth every 6 (six) hours as needed for nausea. 10/27/17   Leandrew Koyanagi, MD  senna-docusate (SENOKOT S) 8.6-50 MG tablet Take 1 tablet by mouth at bedtime as needed. 10/27/17   Leandrew Koyanagi, MD  Thiamine HCl (VITAMIN B-1) 100 MG tablet Take 100 mg by mouth daily. Reported on 02/17/2015    [provider]  traZODone (DESYREL) 50 MG tablet Take 75 mg by mouth at bedtime.    [provider]    Physical Exam: Vitals:   01/17/18 1913 01/17/18 2215 01/18/18 0030  BP: (!) 156/72 (!) 149/91 (!) 147/84  Pulse: 74 80 91  Resp: 16 14 17   Temp: 98 F (36.7 C)    TempSrc: Oral    SpO2: 96% 100% 95%   General: Not in acute distress. Has anasarca. HEENT:       Eyes: PERRL, EOMI, no scleral icterus.       ENT: No discharge from the ears and nose, no pharynx injection, no tonsillar enlargement.        Neck: No JVD, no bruit, no mass felt. Heme: No neck lymph node enlargement. Cardiac: S1/S2, RRR, No murmurs, No gallops or rubs. Respiratory: No rales, wheezing, rhonchi or rubs. GI: has mild abdominal distension, nontender, no rebound pain, no organomegaly, BS present. GU: No hematuria Ext: 3+ pitting leg edema bilaterally. 2+DP/PT pulse bilaterally. Musculoskeletal: No joint deformities, No joint redness or warmth, no limitation of ROM in spin. Skin: No rashes.  Neuro: confused, but is oriented X3, cranial nerves II-XII grossly intact, moves all extremities normally.  Psych: Patient is not psychotic, no suicidal or hemocidal ideation.  Labs on Admission: I have personally reviewed following labs and imaging studies  CBC: Recent Labs  Lab 01/16/18 1626 01/17/18 1923  WBC 7.8  6.1  NEUTROABS 6.0  --   HGB 12.0* 11.9*  HCT 35.2* 35.2*  MCV 85.1 85.4  PLT 141.0* 151  Basic Metabolic Panel: Recent Labs  Lab 01/16/18 1626 01/17/18 1923  NA 121* 121*  K 4.3 3.8  CL 85* 84*  CO2 29 25  GLUCOSE 83 75  BUN 6 5*  CREATININE 0.79 0.87  CALCIUM 8.9 8.3*   GFR: Estimated Creatinine Clearance: 83.6 mL/min (by C-G formula based on SCr of 0.87 mg/dL). Liver Function Tests: Recent Labs  Lab 01/16/18 1626 01/17/18 1923  AST 24 30  ALT 10 13  ALKPHOS 63 62  BILITOT 0.8 0.9  PROT 6.5 6.8  ALBUMIN 4.0 3.8   No results for input(s): LIPASE, AMYLASE in the last 168 hours. Recent Labs  Lab 01/17/18 2152  AMMONIA 47*   Coagulation Profile: Recent Labs  Lab 01/17/18 0020  INR 1.17   Cardiac Enzymes: No results for input(s): CKTOTAL, CKMB, CKMBINDEX, TROPONINI in the last 168 hours. BNP (last 3 results) Recent Labs    01/16/18 1626  PROBNP 547.0*   HbA1C: No results for input(s): HGBA1C in the last 72 hours. CBG: Recent Labs  Lab 01/17/18 2150 01/17/18 2318 01/18/18 0046  GLUCAP 47* 77 115*   Lipid Profile: No results for input(s): CHOL, HDL, LDLCALC, TRIG, CHOLHDL, LDLDIRECT in the last 72 hours. Thyroid Function Tests: No results for input(s): TSH, T4TOTAL, FREET4, T3FREE, THYROIDAB in the last 72 hours. Anemia Panel: No results for input(s): VITAMINB12, FOLATE, FERRITIN, TIBC, IRON, RETICCTPCT in the last 72 hours. Urine analysis:    Component Value Date/Time   COLORURINE YELLOW 01/17/2018 2330   APPEARANCEUR CLEAR 01/17/2018 2330   LABSPEC 1.008 01/17/2018 2330   PHURINE 6.0 01/17/2018 2330   GLUCOSEU 50 (A) 01/17/2018 2330   HGBUR NEGATIVE 01/17/2018 2330   BILIRUBINUR NEGATIVE 01/17/2018 2330   BILIRUBINUR neg 11/17/2010 1210   KETONESUR 20 (A) 01/17/2018 2330   PROTEINUR NEGATIVE 01/17/2018 2330   UROBILINOGEN 1.0 11/17/2011 1451   NITRITE NEGATIVE 01/17/2018 2330   LEUKOCYTESUR NEGATIVE 01/17/2018 2330   Sepsis  Labs: @LABRCNTIP (procalcitonin:4,lacticidven:4) )No results found for this or any previous visit (from the past 240 hour(s)).   Radiological Exams on Admission: Dg Chest 2 View  Result Date: 01/16/2018 CLINICAL DATA:  Dyspnea on exertion. EXAM: CHEST - 2 VIEW COMPARISON:  Radiograph of November 10, 2017. FINDINGS: Stable cardiomegaly. No pneumothorax or pleural effusion is noted. No pneumothorax or pleural effusion is noted. Old bilateral rib fractures are noted. Right-sided PICC line has been removed. No consolidative process is noted. IMPRESSION: No active cardiopulmonary disease. Electronically Signed   By: Marijo Conception, M.D.   On: 01/16/2018 21:52   Ct Head Wo Contrast  Result Date: 01/18/2018 CLINICAL DATA:  Altered mental status. Confusion. EXAM: CT HEAD WITHOUT CONTRAST TECHNIQUE: Contiguous axial images were obtained from the base of the skull through the vertex without intravenous contrast. COMPARISON:  Most recent comparison 07/29/2017 FINDINGS: Brain: No intracranial hemorrhage, mass effect, or midline shift. No hydrocephalus. The basilar cisterns are patent. No evidence of territorial infarct or acute ischemia. Unchanged degree of atrophy and chronic small vessel ischemia. No extra-axial or intracranial fluid collection. Vascular: Atherosclerosis of skullbase vasculature without hyperdense vessel or abnormal calcification. Skull: Upper cervical fusion hardware is partially included. No fracture or focal lesion. Sinuses/Orbits: Chronic mucosal thickening of the left frontal sinus with progression from prior exam. Chronic opacification of scattered left ethmoid air cells. Chronic opacification of scattered mastoid air cells with slight progression. Bilateral cataract resection. Other: None. IMPRESSION: 1. No acute intracranial abnormality. 2. Unchanged atrophy and chronic small vessel ischemia. 3. Chronic  sinusitis with slight increased inflammation from July 19 CT. Electronically Signed   By:  Keith Rake M.D.   On: 01/18/2018 00:24     EKG: Independently reviewed.  Sinus rhythm, QTC 475, poor R wave progression, PAC, anteroseptal infarction pattern Assessment/Plan Principal Problem:   Hyponatremia Active Problems:   Alcohol abuse   Essential hypertension   ALCOHOLIC CIRRHOSIS OF LIVER   Anemia   Acute metabolic encephalopathy   Hypoglycemia   GERD (gastroesophageal reflux disease)   SOB (shortness of breath)   Hyponatremia: Na =125.  Likely multifactorial etiology, including poor oral intake, liver cirrhosis and Lasix use.  Other differential diagnosis include SIADH and thyroid dysfunction.  Difficult situation.  Patient is not a good candidate to be treated with IV sodium chloride due to anasarca  -will admit to tele bed as inpt due to multiple acute presentations. - Will check urine sodium, urine osmolality, serum osmolality. - check TSH - Fluid restriction to 1000 cc - f/u by BMP in AM  Acute metabolic encephalopathy: Patient has history of alcoholic dementia, but per his wife, pt's mental status has worsened today.  CT head is negative for acute intracranial abnormalities.  Ammonium is elevated slightly at 147, indicating possible hepatic encephalopathy -Frequent neuro check -Lactulose 20 g twice daily -check UDS and UA  Hx of alcohol abuse: -in remission  HTN:  -Continue home medications: Metoprolol -IV hydralazine prn  Iron-deficiency anemia: Hemoglobin 11.9 To follow-up with CBC  ALCOHOLIC CIRRHOSIS OF LIVER: -check INR and PTT  Hypoglycemia: initially Blood sugar 47-->75 on BMP -CBG q2h -prn D50  GERD: -Protonix  SOB (shortness of breath): Etiology is not clear.  Patient does not have chest pain.  He has mild cough, but no fever or chills.  Clinically, less likely to have pneumonia.  May be due to anasarca and abdominal distention, which is pushing up and limiting lung expansion.  Patient's wife states that the patient had wheezing at home,  but I do not hear any wheezing on auscultation. - DuoNeb nebulizers, PRN albuterol nebulizers, PRN Mucinex -check BNP   Inpatient status:  # Patient requires inpatient status due to high intensity of service, high risk for further deterioration and high frequency of surveillance required.  I certify that at the point of admission it is my clinical judgment that the patient will require inpatient hospital care spanning beyond 2 midnights from the point of admission.  . This patient has multiple chronic comorbidities including alcohol abuse in remission, alcoholic liver cirrhosis, alcoholic dementia, hypertension, hyperlipidemia, GERD, depression, iron deficiency anemia, BPH, paroxysmal atrial tachycardia, . Now patient has presenting symptoms include AMS and SOB . The worrisome physical exam findings include confusion, abdominal distention and anasarca . The initial radiographic and laboratory data are worrisome because of hyponatremia and hypoglycemia . Current medical needs: please see my assessment and plan . Predictability of an adverse outcome (risk): Patient has multiple comorbidities, now presents with altered mental status and possible hepatic encephalopathy, hyponatremia, hypoglycemia.  Also has shortness of breath.  At high risk of deteriorating.  Will need to stay in hospital for at least for 2 days.      DVT ppx:  SQ Lovenox Code Status: DNR (I discussed with his wife who is POA , and explained the meaning of CODE STATUS. Patient would want to be DNR) Family Communication:   Yes, patient's wife   at bed side Disposition Plan:  Anticipate discharge back to previous home environment Consults called:  none Admission status:  Inpatient/tele     Date of Service 01/18/2018    Ivor Costa [facility] Pager [phone]  If 7PM-7AM, please contact night-coverage [email or web address] Password [email or web address] 01/18/2018, 3:13 AM

## 2018-01-17 NOTE — ED Notes (Signed)
Pt CBG was 47, notified Dr. Gilford Raid

## 2018-01-17 NOTE — ED Triage Notes (Signed)
Pt was sent by provider after labs came back w/ Na 125 per family member.  Pt is SOB upon exertion, more confusion than normal (since Monday) and some dizziness.

## 2018-01-17 NOTE — ED Provider Notes (Signed)
[facility] EMERGENCY DEPARTMENT Provider Note   CSN: [id number] Arrival date & time: 01/17/18  1904     History   Chief Complaint Chief Complaint  Patient presents with  . Shortness of Breath  . Altered Mental Status    HPI [name] is a 73 y.o. male.  Pt presents to the ED today with hyponatremia, sob, and confusion.  The pt has been sob with exertion for several days.  He saw his doctor yesterday who started him on lasix.  His PCP also ordered labs which showed a sodium of 125.  He was told to come to the hospital.  The pt's wife said he has been confused.  Pt does have a hx of CM and cirrhosis.  The pt has had hyponatremia; however, it was normal in November and has been progressively declining since 12/20.     Past Medical History:  Diagnosis Date  . Adenomatous polyp of colon 2006  . Alcohol-induced persisting dementia ([place])   . Anemia 2012  . Anxiety   . Arthritis    "left arm/shoulder" (05/17/2016)  . Atrial tachycardia, paroxysmal ([place]) 04/07/2015  . BPH (benign prostatic hypertrophy) 5/99  . Chronic bronchitis ([place])    "q year or q other year" (11/02/2011)  . Cirrhosis of liver ([place])   . DCM (dilated cardiomyopathy) ([place])    EF 45-50% by echo and cath 2017  . Depression    Hx of  . Falls frequently    "daily lately; legs just give out" (11/02/2011; 05/17/2016)  . GERD (gastroesophageal reflux disease) 07/31/04   gastritis   . H/O hiatal hernia   . Hard of hearing   . Heart murmur   . Hernia, umbilical    "unrepaired" (11/02/2011; 05/17/2016)  . History of blood transfusion 2012  . HLD (hyperlipidemia) 5/99  . HTN (hypertension) 5/99  . Mild CAD    10% RCA  . PAC (premature atrial contraction) 04/07/2015  . Peripheral vascular disease ([place])   . Pneumonia 1996   hosp  . PONV (postoperative nausea and vomiting)   . Seasonal allergies    pollen / ragweed  . Wears glasses     Patient Active Problem List   Diagnosis Date Noted  .  Acute metabolic encephalopathy 78/29/5621  . Hypoglycemia 01/17/2018  . GERD (gastroesophageal reflux disease) 01/17/2018  . SOB (shortness of breath) 01/17/2018  . Encephalopathy   . Palliative care by specialist   . Goals of care, counseling/discussion   . Endotracheally intubated   . History of ETT   . Acute respiratory failure with hypoxemia ([place])   . Acute respiratory insufficiency   . Closed fracture of left olecranon process 10/27/2017  . Fracture of left olecranon process, closed, initial encounter 10/27/2017  . Acute streptococcal pharyngitis 09/06/2017  . Frequent falls 06/29/2017  . Fall 04/09/2017  . Scalp laceration   . Hypomagnesemia 10/25/2016  . Hip fracture ([place]) 10/24/2016  . Displaced fracture of left femoral neck ([place]) 10/24/2016  . Thrombocytopenia ([place]) 10/24/2016  . Closed displaced fracture of left femoral neck ([place]) 10/23/2016  . Contracture of muscle of left hand 09/15/2016  . Encounter for nasogastric tube placement 07/08/2016  . Infected laceration 07/08/2016  . Generalized muscle weakness 07/08/2016  . Anemia 05/18/2016  . Syncope and collapse   . Syncope 05/17/2016  . Cellulitis 05/17/2016  . Rib fractures 05/17/2016  . Bullous pemphigoid 05/17/2016  . ETOH abuse 03/17/2016  . Dizziness and giddiness 12/09/2015  . Chronic  pain syndrome 12/09/2015  . Arthritis 11/20/2015  . Mild CAD   . DCM (dilated cardiomyopathy) ([place])   . Abnormal nuclear stress test 05/07/2015  . PAC (premature atrial contraction) 04/07/2015  . Atrial tachycardia, paroxysmal ([place]) 03/10/2015  . Depression 04/12/2012  . C2 cervical fracture ([place]) 02/29/2012  . Alcohol withdrawal ([place]) 11/06/2011  . Fracture of humerus, proximal, left, closed 11/02/2011  . Hyponatremia 11/17/2010  . H/O: upper GI bleed 10/20/2010  . Stomach ulcer from aspirin/ibuprofen-like drugs (NSAID's) 10/20/2010  . Colon polyp 08/26/2010  . Diverticulosis of colon (without mention of hemorrhage)  08/26/2010  . BPH (benign prostatic hyperplasia) 07/02/2010  . Deficiency anemia 03/09/2010  . Hyposmolality and/or hyponatremia 10/17/2009  . ALCOHOLIC CIRRHOSIS OF LIVER 07/25/2009  . Hypokalemia 04/03/2007  . Anxiety state 01/24/2007  . HLD (hyperlipidemia) 07/28/2006  . Alcohol abuse 07/28/2006  . Essential hypertension 07/28/2006  . IMPOTENCE, ORGANIC ORIGIN 07/28/2006    Past Surgical History:  Procedure Laterality Date  . BACK SURGERY    . CARDIAC CATHETERIZATION N/A 05/23/2015   Procedure: Left Heart Cath and Coronary Angiography;  Surgeon: Troy Sine, MD;  Location: [facility];  Service: Cardiovascular;  Laterality: N/A;  . CATARACT EXTRACTION W/ INTRAOCULAR LENS  IMPLANT, BILATERAL Bilateral 10/2002  . COLONOSCOPY  07/31/04   multiple polyps; repeat in 3 years  . DUPUYTREN CONTRACTURE RELEASE Left   . ETT myoview  03/09/07   nml EF 66%  . FRACTURE SURGERY    . HEMORRHOID SURGERY     "cauterized a long time ago" (11/02/2011)  . hosp CP R/O'D 2/24  03/08/07  . neck MRI  6/04   C5-6 disc abnormality  . ORIF ELBOW FRACTURE Left 10/27/2017   Procedure: OPEN REDUCTION INTERNAL FIXATION (ORIF) LEFT OLECRANON;  Surgeon: Leandrew Koyanagi, MD;  Location: [facility];  Service: Orthopedics;  Laterality: Left;  . ORIF HUMERUS FRACTURE  11/04/2011   Procedure: OPEN REDUCTION INTERNAL FIXATION (ORIF) PROXIMAL HUMERUS FRACTURE;  Surgeon: Rozanna Box, MD;  Location: [facility];  Service: Orthopedics;  Laterality: Left;  . POSTERIOR CERVICAL FUSION/FORAMINOTOMY N/A 03/21/2012   Procedure: POSTERIOR CERVICAL FUSION/FORAMINOTOMY LEVEL ONE;  Surgeon: Charlie Pitter, MD;  Location: [facility];  Service: Neurosurgery;  Laterality: N/A;  POSTERIOR CERVICAL ONE TO CERVICAL TWO FUSION WITH ILIAC CREST GRAFT AND LATERAL MASS SCREWS   . TONSILLECTOMY     "I was young" (11/02/2011)  . TOTAL HIP ARTHROPLASTY Left 10/24/2016   Procedure: TOTAL HIP ARTHROPLASTY ANTERIOR APPROACH;  Surgeon: Rod Can, MD;  Location: [facility];  Service: Orthopedics;  Laterality: Left;  . UPPER GASTROINTESTINAL ENDOSCOPY          Home Medications    Prior to Admission medications   Medication Sig Start Date End Date Taking? Authorizing Provider  aspirin EC 81 MG tablet Take 81 mg by mouth daily.   Yes [provider]  cetirizine (ZYRTEC) 10 MG tablet Take 1 tablet (10 mg total) by mouth daily. 10/03/17  Yes Elby Beck, FNP  Cyanocobalamin (VITAMIN B-12 PO) Take 1 tablet by mouth daily.   Yes [provider]  fluticasone (FLONASE) 50 MCG/ACT nasal spray Place 1 spray into both nostrils 2 (two) times daily as needed for allergies. 06/22/17  Yes Elby Beck, FNP  folic acid (FOLVITE) 1 MG tablet Take 1 tablet (1 mg total) by mouth daily. 10/31/16  Yes Isaac Bliss, Rayford Halsted, MD  furosemide (LASIX) 20 MG tablet Take 1 tablet (20 mg  total) by mouth daily. 01/16/18  Yes Elby Beck, FNP  ibuprofen (ADVIL,MOTRIN) 400 MG tablet Take 1 tablet (400 mg total) by mouth every 6 (six) hours as needed for moderate pain (neck pain). 04/11/17  Yes Shelly Coss, MD  lidocaine (XYLOCAINE) 5 % ointment Apply 1 application topically as needed. Patient taking differently: Apply 1 application topically 2 (two) times daily as needed for mild pain.  12/30/17  Yes Elby Beck, FNP  magnesium oxide (MAG-OX) 400 (241.3 Mg) MG tablet Take 1 tablet (400 mg total) by mouth daily. 04/12/17  Yes Shelly Coss, MD  Melatonin 5 MG TABS Take 20 mg by mouth at bedtime.   Yes [provider]  methocarbamol (ROBAXIN) 750 MG tablet Take 1 tablet (750 mg total) by mouth 2 (two) times daily as needed for muscle spasms. 12/30/17  Yes Elby Beck, FNP  metoprolol tartrate (LOPRESSOR) 25 MG tablet Take 1 tablet (25 mg total) by mouth 2 (two) times daily. Patient taking differently: Take 50 mg by mouth 2 (two) times daily.  12/30/17 01/29/18 Yes Elby Beck, FNP  Multiple Vitamin  (MULTIVITAMIN WITH MINERALS) TABS tablet Take 1 tablet by mouth daily. 10/31/16  Yes Isaac Bliss, Rayford Halsted, MD  ondansetron (ZOFRAN) 4 MG tablet Take 1-2 tablets (4-8 mg total) by mouth every 8 (eight) hours as needed for nausea or vomiting. 10/27/17  Yes Leandrew Koyanagi, MD  pantoprazole (PROTONIX) 40 MG tablet Take 1 tablet (40 mg total) by mouth daily. 12/30/17  Yes Elby Beck, FNP  promethazine (PHENERGAN) 25 MG tablet Take 1 tablet (25 mg total) by mouth every 6 (six) hours as needed for nausea. 10/27/17  Yes Leandrew Koyanagi, MD  Thiamine HCl (VITAMIN B-1) 100 MG tablet Take 100 mg by mouth daily. Reported on 02/17/2015   Yes [provider]  traZODone (DESYREL) 50 MG tablet Take 75 mg by mouth at bedtime.   Yes [provider]  aspirin EC 325 MG EC tablet Take 1 tablet (325 mg total) by mouth daily. Patient not taking: Reported on 01/17/2018 11/17/17   Donne Hazel, MD  senna-docusate (SENOKOT S) 8.6-50 MG tablet Take 1 tablet by mouth at bedtime as needed. Patient not taking: Reported on 01/17/2018 10/27/17   Leandrew Koyanagi, MD    Family History Family History  Problem Relation Age of Onset  . Heart disease Father   . Diabetes Father   . Stroke Father   . Depression Mother   . Heart disease Brother   . Alcohol abuse Brother   . Liver disease Brother     Social History Social History   Tobacco Use  . Smoking status: Former Smoker    Types: Pipe  . Smokeless tobacco: Never Used  . Tobacco comment: 05/17/2016 "stopped in the late 1990s"  Substance Use Topics  . Alcohol use: Yes    Alcohol/week: 28.0 standard drinks    Types: 28 Glasses of wine per week    Comment: last drink 10/22/17  . Drug use: No     Allergies   Nifedipine and Bee venom   Review of Systems Review of Systems  Respiratory: Positive for shortness of breath.   Cardiovascular: Positive for leg swelling.  All other systems reviewed and are negative.    Physical Exam Updated  Vital Signs BP (!) 149/91   Pulse 80   Temp 98 F (36.7 C) (Oral)   Resp 14   SpO2 100%   Physical Exam Vitals  signs and nursing note reviewed.  Constitutional:      Appearance: He is well-developed.  HENT:     Head: Normocephalic and atraumatic.     Mouth/Throat:     Mouth: Mucous membranes are moist.     Pharynx: Oropharynx is clear.  Eyes:     Extraocular Movements: Extraocular movements intact.     Pupils: Pupils are equal, round, and reactive to light.  Neck:     Musculoskeletal: Normal range of motion and neck supple.  Cardiovascular:     Rate and Rhythm: Normal rate. Rhythm irregular.  Pulmonary:     Effort: Pulmonary effort is normal.     Breath sounds: Normal breath sounds.  Abdominal:     General: Bowel sounds are normal.     Palpations: Abdomen is soft. There is fluid wave.     Hernia: A hernia is present. Hernia is present in the umbilical area.  Musculoskeletal:     Right lower leg: Edema present.     Left lower leg: Edema present.  Skin:    General: Skin is warm.     Capillary Refill: Capillary refill takes less than 2 seconds.  Neurological:     General: No focal deficit present.     Mental Status: He is alert and oriented to person, place, and time.  Psychiatric:        Mood and Affect: Mood normal.        Behavior: Behavior normal.      ED Treatments / Results  Labs (all labs ordered are listed, but only abnormal results are displayed) Labs Reviewed  COMPREHENSIVE METABOLIC PANEL - Abnormal; Notable for the following components:      Result Value   Sodium 121 (*)    Chloride 84 (*)    BUN 5 (*)    Calcium 8.3 (*)    All other components within normal limits  CBC - Abnormal; Notable for the following components:   RBC 4.12 (*)    Hemoglobin 11.9 (*)    HCT 35.2 (*)    All other components within normal limits  AMMONIA - Abnormal; Notable for the following components:   Ammonia 47 (*)    All other components within normal limits  CBG  MONITORING, ED - Abnormal; Notable for the following components:   Glucose-Capillary 47 (*)    All other components within normal limits  OSMOLALITY, URINE  OSMOLALITY  SODIUM, URINE, RANDOM  CREATININE, URINE, RANDOM  TSH  RAPID URINE DRUG SCREEN, HOSP PERFORMED  URINALYSIS, ROUTINE W REFLEX MICROSCOPIC  BRAIN NATRIURETIC PEPTIDE  PROTIME-INR  APTT  BASIC METABOLIC PANEL  CBC  CBG MONITORING, ED    EKG EKG Interpretation  Date/Time:  Tuesday January 17 2018 19:10:28 EST Ventricular Rate:  77 PR Interval:    QRS Duration: 94 QT Interval:  420 QTC Calculation: 475 R Axis:   84 Text Interpretation:  Anteroseptal infarct , age undetermined Abnormal ECG PACs No significant change since last tracing Confirmed by Isla Pence [phone]) on 01/17/2018 9:47:00 PM   Radiology Dg Chest 2 View  Result Date: 01/16/2018 CLINICAL DATA:  Dyspnea on exertion. EXAM: CHEST - 2 VIEW COMPARISON:  Radiograph of November 10, 2017. FINDINGS: Stable cardiomegaly. No pneumothorax or pleural effusion is noted. No pneumothorax or pleural effusion is noted. Old bilateral rib fractures are noted. Right-sided PICC line has been removed. No consolidative process is noted. IMPRESSION: No active cardiopulmonary disease. Electronically Signed   By: Marijo Conception, M.D.  On: 01/16/2018 21:52    Procedures Procedures (including critical care time)  Medications Ordered in ED Medications  lactulose (CHRONULAC) 10 GM/15ML solution 20 g (has no administration in time range)  dextrose 50 % solution 50 mL (has no administration in time range)  ipratropium-albuterol (DUONEB) 0.5-2.5 (3) MG/3ML nebulizer solution 3 mL (has no administration in time range)  albuterol (PROVENTIL) (2.5 MG/3ML) 0.083% nebulizer solution 2.5 mg (has no administration in time range)  dextromethorphan-guaiFENesin (MUCINEX DM) 30-600 MG per 12 hr tablet 1 tablet (has no administration in time range)  enoxaparin (LOVENOX) injection 40 mg  (has no administration in time range)  acetaminophen (TYLENOL) tablet 650 mg (has no administration in time range)    Or  acetaminophen (TYLENOL) suppository 650 mg (has no administration in time range)  senna-docusate (Senokot-S) tablet 1 tablet (has no administration in time range)  ondansetron (ZOFRAN) tablet 4 mg (has no administration in time range)    Or  ondansetron (ZOFRAN) injection 4 mg (has no administration in time range)  hydrALAZINE (APRESOLINE) injection 5 mg (has no administration in time range)  dextrose 50 % solution (50 mLs Intravenous Given 01/17/18 2217)     Initial Impression / Assessment and Plan / ED Course  I have reviewed the triage vital signs and the nursing notes.  Pertinent labs & imaging results that were available during my care of the patient were reviewed by me and considered in my medical decision making (see chart for details).   Pt's blood sugar was checked and it was 47.  He was given an amp of d50 and food.    Ammonia is slightly elevated.    Pt's sodium has decreased significantly in 2 months.  He was d/w Dr. Blaine Hamper ([facility]) for admission.  Final Clinical Impressions(s) / ED Diagnoses   Final diagnoses:  Hyponatremia  Anasarca  Hypoglycemia    ED Discharge Orders    None       Isla Pence, MD 01/17/18 2313

## 2018-01-17 NOTE — Telephone Encounter (Signed)
[facility] lab called a critical NA - 121.[name] Results given to Webb Silversmith, NP

## 2018-01-17 NOTE — ED Notes (Signed)
Pt is gone to CT will come back to draw blood

## 2018-01-18 ENCOUNTER — Telehealth (INDEPENDENT_AMBULATORY_CARE_PROVIDER_SITE_OTHER): Payer: Self-pay | Admitting: Orthopaedic Surgery

## 2018-01-18 ENCOUNTER — Other Ambulatory Visit: Payer: Self-pay

## 2018-01-18 ENCOUNTER — Encounter (HOSPITAL_COMMUNITY): Payer: Self-pay

## 2018-01-18 DIAGNOSIS — K7290 Hepatic failure, unspecified without coma: Secondary | ICD-10-CM

## 2018-01-18 DIAGNOSIS — R601 Generalized edema: Secondary | ICD-10-CM

## 2018-01-18 LAB — BASIC METABOLIC PANEL
Anion gap: 13 (ref 5–15)
Anion gap: 9 (ref 5–15)
Anion gap: 14 (ref 5–15)
Anion gap: 10 (ref 5–15)
BUN: 5 mg/dL — AB (ref 8–23)
BUN: 5 mg/dL — ABNORMAL LOW (ref 8–23)
BUN: 5 mg/dL — ABNORMAL LOW (ref 8–23)
BUN: <5 mg/dL — ABNORMAL LOW (ref 8–23)
CO2: 23 mmol/L (ref 22–32)
CO2: 28 mmol/L (ref 22–32)
CO2: 26 mmol/L (ref 22–32)
CO2: 30 mmol/L (ref 22–32)
CREATININE: 1.12 mg/dL (ref 0.61–1.24)
Calcium: 8.4 mg/dL — ABNORMAL LOW (ref 8.9–10.3)
Calcium: 8.5 mg/dL — ABNORMAL LOW (ref 8.9–10.3)
Calcium: 8.5 mg/dL — ABNORMAL LOW (ref 8.9–10.3)
Calcium: 8.4 mg/dL — ABNORMAL LOW (ref 8.9–10.3)
Chloride: 86 mmol/L — ABNORMAL LOW (ref 98–111)
Chloride: 86 mmol/L — ABNORMAL LOW (ref 98–111)
Chloride: 86 mmol/L — ABNORMAL LOW (ref 98–111)
Chloride: 88 mmol/L — ABNORMAL LOW (ref 98–111)
Creatinine, Ser: 0.86 mg/dL (ref 0.61–1.24)
Creatinine, Ser: 1.09 mg/dL (ref 0.61–1.24)
Creatinine, Ser: 1 mg/dL (ref 0.61–1.24)
GFR calc Af Amer: >60 mL/min (ref >60)
GFR calc Af Amer: >60 mL/min (ref >60)
GFR calc Af Amer: >60 mL/min (ref >60)
GFR calc Af Amer: >60 mL/min (ref >60)
GFR calc non Af Amer: >60 mL/min (ref >60)
GFR calc non Af Amer: >60 mL/min (ref >60)
GFR calc non Af Amer: >60 mL/min (ref >60)
GFR calc non Af Amer: >60 mL/min (ref >60)
GLUCOSE: 88 mg/dL (ref 70–99)
Glucose, Bld: 73 mg/dL (ref 70–99)
Glucose, Bld: 102 mg/dL — ABNORMAL HIGH (ref 70–99)
Glucose, Bld: 94 mg/dL (ref 70–99)
Potassium: 4.1 mmol/L (ref 3.5–5.1)
Potassium: 3.8 mmol/L (ref 3.5–5.1)
Potassium: 3.9 mmol/L (ref 3.5–5.1)
Potassium: 3.3 mmol/L — ABNORMAL LOW (ref 3.5–5.1)
SODIUM: 123 mmol/L — AB (ref 135–145)
Sodium: 122 mmol/L — ABNORMAL LOW (ref 135–145)
Sodium: 126 mmol/L — ABNORMAL LOW (ref 135–145)
Sodium: 128 mmol/L — ABNORMAL LOW (ref 135–145)

## 2018-01-18 LAB — CREATININE, URINE, RANDOM: Creatinine, Urine: 70.7 mg/dL

## 2018-01-18 LAB — URINALYSIS, ROUTINE W REFLEX MICROSCOPIC
Bilirubin Urine: NEGATIVE
GLUCOSE, UA: 50 mg/dL — AB
Hgb urine dipstick: NEGATIVE
Ketones, ur: 20 mg/dL — AB
Leukocytes, UA: NEGATIVE
Nitrite: NEGATIVE
PROTEIN: NEGATIVE mg/dL
Specific Gravity, Urine: 1.008 (ref 1.005–1.030)
pH: 6 (ref 5.0–8.0)

## 2018-01-18 LAB — MRSA PCR SCREENING: MRSA by PCR: POSITIVE — AB

## 2018-01-18 LAB — CBC
HCT: 33.8 % — ABNORMAL LOW (ref 39.0–52.0)
Hemoglobin: 11.4 g/dL — ABNORMAL LOW (ref 13.0–17.0)
MCH: 28.6 pg (ref 26.0–34.0)
MCHC: 33.7 g/dL (ref 30.0–36.0)
MCV: 84.9 fL (ref 80.0–100.0)
Platelets: 156 10*3/uL (ref 150–400)
RBC: 3.98 MIL/uL — ABNORMAL LOW (ref 4.22–5.81)
RDW: 14.6 % (ref 11.5–15.5)
WBC: 7.3 10*3/uL (ref 4.0–10.5)
nRBC: 0 % (ref 0.0–0.2)

## 2018-01-18 LAB — TSH: TSH: 1.669 u[IU]/mL (ref 0.350–4.500)

## 2018-01-18 LAB — PROTIME-INR
INR: 1.17
INR: 1.23
Prothrombin Time: 14.8 seconds (ref 11.4–15.2)
Prothrombin Time: 15.4 seconds — ABNORMAL HIGH (ref 11.4–15.2)

## 2018-01-18 LAB — SODIUM, URINE, RANDOM
Sodium, Ur: 119 mmol/L (ref 28–272)
Sodium, Ur: 22 mmol/L

## 2018-01-18 LAB — APTT: aPTT: 36 seconds (ref 24–36)

## 2018-01-18 LAB — EXTRA URINE SPECIMEN

## 2018-01-18 LAB — GLUCOSE, CAPILLARY
Glucose-Capillary: 80 mg/dL (ref 70–99)
Glucose-Capillary: 84 mg/dL (ref 70–99)

## 2018-01-18 LAB — RAPID URINE DRUG SCREEN, HOSP PERFORMED
Amphetamines: NOT DETECTED
BENZODIAZEPINES: NOT DETECTED
Barbiturates: NOT DETECTED
Cocaine: NOT DETECTED
Opiates: NOT DETECTED
Tetrahydrocannabinol: NOT DETECTED

## 2018-01-18 LAB — BRAIN NATRIURETIC PEPTIDE: B NATRIURETIC PEPTIDE 5: 148 pg/mL — AB (ref 0.0–100.0)

## 2018-01-18 LAB — OSMOLALITY
Osmolality: 254 mOsm/kg — ABNORMAL LOW (ref 278–305)
Osmolality: 295 mOsm/kg (ref 275–295)

## 2018-01-18 LAB — CBG MONITORING, ED
Glucose-Capillary: 101 mg/dL — ABNORMAL HIGH (ref 70–99)
Glucose-Capillary: 115 mg/dL — ABNORMAL HIGH (ref 70–99)
Glucose-Capillary: 68 mg/dL — ABNORMAL LOW (ref 70–99)
Glucose-Capillary: 87 mg/dL (ref 70–99)
Glucose-Capillary: 96 mg/dL (ref 70–99)

## 2018-01-18 LAB — OSMOLALITY, URINE: Osmolality, Ur: 287 mOsm/kg — ABNORMAL LOW (ref 300–900)

## 2018-01-18 MED ORDER — MUPIROCIN 2 % EX OINT
1.0000 "application " | TOPICAL_OINTMENT | Freq: Two times a day (BID) | CUTANEOUS | Status: DC
  Administered 2018-01-18 – 2018-01-21 (×6): 1 via NASAL
  Filled 2018-01-18 (×4): qty 22

## 2018-01-18 MED ORDER — IPRATROPIUM-ALBUTEROL 0.5-2.5 (3) MG/3ML IN SOLN
3.0000 mL | Freq: Three times a day (TID) | RESPIRATORY_TRACT | Status: DC
  Administered 2018-01-18 – 2018-01-20 (×6): 3 mL via RESPIRATORY_TRACT
  Filled 2018-01-18 (×6): qty 3

## 2018-01-18 MED ORDER — LIDOCAINE 5 % EX OINT
1.0000 "application " | TOPICAL_OINTMENT | Freq: Two times a day (BID) | CUTANEOUS | Status: DC | PRN
  Filled 2018-01-18: qty 35.44

## 2018-01-18 MED ORDER — LORATADINE 10 MG PO TABS
10.0000 mg | ORAL_TABLET | Freq: Every day | ORAL | Status: DC
  Administered 2018-01-19 – 2018-01-21 (×3): 10 mg via ORAL
  Filled 2018-01-18 (×4): qty 1

## 2018-01-18 MED ORDER — CHLORHEXIDINE GLUCONATE CLOTH 2 % EX PADS
6.0000 | MEDICATED_PAD | Freq: Every day | CUTANEOUS | Status: DC
  Administered 2018-01-19 – 2018-01-21 (×3): 6 via TOPICAL

## 2018-01-18 MED ORDER — PANTOPRAZOLE SODIUM 40 MG PO TBEC
40.0000 mg | DELAYED_RELEASE_TABLET | Freq: Every day | ORAL | Status: DC
  Administered 2018-01-19 – 2018-01-21 (×3): 40 mg via ORAL
  Filled 2018-01-18 (×4): qty 1

## 2018-01-18 MED ORDER — METOPROLOL TARTRATE 50 MG PO TABS
50.0000 mg | ORAL_TABLET | Freq: Two times a day (BID) | ORAL | Status: DC
  Administered 2018-01-18 – 2018-01-21 (×6): 50 mg via ORAL
  Filled 2018-01-18: qty 2
  Filled 2018-01-18 (×2): qty 1
  Filled 2018-01-18: qty 2
  Filled 2018-01-18 (×3): qty 1

## 2018-01-18 MED ORDER — LACTULOSE ENEMA
300.0000 mL | Freq: Two times a day (BID) | ORAL | Status: DC
  Administered 2018-01-18 – 2018-01-19 (×2): 300 mL via RECTAL
  Filled 2018-01-18 (×3): qty 300

## 2018-01-18 MED ORDER — MAGNESIUM OXIDE 400 (241.3 MG) MG PO TABS
400.0000 mg | ORAL_TABLET | Freq: Every day | ORAL | Status: DC
  Administered 2018-01-19 – 2018-01-21 (×3): 400 mg via ORAL
  Filled 2018-01-18 (×4): qty 1

## 2018-01-18 MED ORDER — VITAMIN B-1 100 MG PO TABS
100.0000 mg | ORAL_TABLET | Freq: Every day | ORAL | Status: DC
  Administered 2018-01-19 – 2018-01-21 (×3): 100 mg via ORAL
  Filled 2018-01-18 (×4): qty 1

## 2018-01-18 MED ORDER — METHOCARBAMOL 750 MG PO TABS
750.0000 mg | ORAL_TABLET | Freq: Two times a day (BID) | ORAL | Status: DC | PRN
  Administered 2018-01-19 – 2018-01-20 (×2): 750 mg via ORAL
  Filled 2018-01-18 (×2): qty 1

## 2018-01-18 MED ORDER — ADULT MULTIVITAMIN W/MINERALS CH
1.0000 | ORAL_TABLET | Freq: Every day | ORAL | Status: DC
  Administered 2018-01-18 – 2018-01-21 (×4): 1 via ORAL
  Filled 2018-01-18 (×4): qty 1

## 2018-01-18 MED ORDER — TRAZODONE HCL 50 MG PO TABS
75.0000 mg | ORAL_TABLET | Freq: Every day | ORAL | Status: DC
  Administered 2018-01-18 – 2018-01-20 (×3): 75 mg via ORAL
  Filled 2018-01-18 (×3): qty 2

## 2018-01-18 MED ORDER — FUROSEMIDE 10 MG/ML IJ SOLN
40.0000 mg | Freq: Two times a day (BID) | INTRAMUSCULAR | Status: DC
  Administered 2018-01-18 – 2018-01-21 (×7): 40 mg via INTRAVENOUS
  Filled 2018-01-18 (×6): qty 4

## 2018-01-18 MED ORDER — ASPIRIN EC 81 MG PO TBEC
81.0000 mg | DELAYED_RELEASE_TABLET | Freq: Every day | ORAL | Status: DC
  Administered 2018-01-19 – 2018-01-21 (×3): 81 mg via ORAL
  Filled 2018-01-18 (×4): qty 1

## 2018-01-18 MED ORDER — VITAMIN B-12 1000 MCG PO TABS
1000.0000 ug | ORAL_TABLET | Freq: Every day | ORAL | Status: DC
  Administered 2018-01-19 – 2018-01-21 (×3): 1000 ug via ORAL
  Filled 2018-01-18 (×5): qty 1

## 2018-01-18 MED ORDER — SPIRONOLACTONE 25 MG PO TABS
50.0000 mg | ORAL_TABLET | Freq: Every day | ORAL | Status: DC
  Administered 2018-01-19 – 2018-01-21 (×3): 50 mg via ORAL
  Filled 2018-01-18 (×2): qty 2
  Filled 2018-01-18: qty 1
  Filled 2018-01-18: qty 2

## 2018-01-18 MED ORDER — MELATONIN 3 MG PO TABS
3.0000 mg | ORAL_TABLET | Freq: Every day | ORAL | Status: DC
  Administered 2018-01-18 – 2018-01-20 (×3): 3 mg via ORAL
  Filled 2018-01-18 (×4): qty 1

## 2018-01-18 MED ORDER — FUROSEMIDE 10 MG/ML IJ SOLN
INTRAMUSCULAR | Status: AC
  Filled 2018-01-18: qty 4

## 2018-01-18 MED ORDER — ORAL CARE MOUTH RINSE
15.0000 mL | Freq: Two times a day (BID) | OROMUCOSAL | Status: DC
  Administered 2018-01-18 – 2018-01-21 (×6): 15 mL via OROMUCOSAL

## 2018-01-18 MED ORDER — FLUTICASONE PROPIONATE 50 MCG/ACT NA SUSP: 1.0000 | Freq: Two times a day (BID) | NASAL | Status: DC | PRN

## 2018-01-18 NOTE — Progress Notes (Signed)
PROGRESS NOTE                                                                                                                                                                                                             Patient Demographics:    [name], is a 73 y.o. male, DOB - 01-18-1945, [id number]:[id number]  Admit date - 01/17/2018   Admitting Physician Ivor Costa, MD  Outpatient Primary MD for the patient is Elby Beck, [place]  LOS - 1  Outpatient Specialists: None   Chief Complaint  Patient presents with  . Shortness of Breath  . 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  At baseline patient is hard of hearing but mostly oriented x3.  For the past several days he has become less interactive and confused.  He also reported shortness of breath on exertion.  Patient was seen by his PCP on the day prior to admission and found to be hyponatremic with sodium of 125.  His PCP started him on Lasix but since he was still confused and had a leg swellings with shortness of breath he was brought to the ED.  In the ED vitals were stable.  Sodium was found to be 121, WBC of 6.1, normal renal function, ammonia 47.  Head CT negative for acute findings. Patient admitted for further management of his acute hepatic encephalopathy, anasarca and hyponatremia.    Subjective:   Patient is hard of hearing but oriented.  Informed breathing to be slightly better since presentation.  Reported to the nurse of having some dysphagia.  Denies any abdominal pain, melena or hematemesis.   Assessment  & Plan :    Principal Problem:   Hyponatremia Likely hypervolemic with decompensated cirrhosis. Continue telemetry monitoring.  Although mental status improved since presentation wife feels he still not at baseline. FeNa of 0.2.  Added Lasix 40 mg every 12 hours along with Aldactone.  Strict fluid restriction of 1000 cc per 24 hours.[name]  BMET every 6 hours. Renal consult if no improvement.  Active Problems:  Decompensated liver cirrhosis. Has mild anasarca.  Added Lasix and Aldactone.  Strict I's/O.  Will need to establish with GI as outpatient.  (Saw Dr. Sharlett Iles remotely).  Reported being on alcohol remission since October 2019.  Check INR.  Shortness of breath Possibly due to anasarca.  Monitor with diuresis.  Acute hepatic encephalopathy Mental status improving but still not at baseline.  Head CT unremarkable.  Ammonia 47.  Continue scheduled lactulose.  Hypoglycemia CBG found to be as low as 40s-70s.  No history of diabetes.  Monitor closely.  Dysphasia SLP evaluation.  Meds to be crushed in pure in the interim.  Anemia of chronic disease H&H currently stable.  Monitor  GERD Continue PPI       Code Status : DNR  Family Communication  : Wife at bedside  Disposition Plan  : Home pending clinical improvement  Barriers For Discharge : Active symptoms  Consults  : None  Procedures  : Head CT  DVT Prophylaxis  : Subcu Lovenox  Lab Results  Component Value Date   PLT 156 01/18/2018    Antibiotics  :    Anti-infectives (From admission, onward)   None        Objective:   Vitals:   01/18/18 0530 01/18/18 0545 01/18/18 0600 01/18/18 0732  BP: 121/76 (!) 153/84 (!) 172/100 133/76  Pulse: 75 74 78 71  Resp: 17 (!) 23 20 15   Temp:      TempSrc:      SpO2: 99% 99% 99% 98%    Wt Readings from Last 3 Encounters:  01/16/18 97.5 kg  12/30/17 92.1 kg  11/15/17 76.2 kg     Intake/Output Summary (Last 24 hours) at 01/18/2018 0848 Last data filed at 01/17/2018 2221 Gross  per 24 hour  Intake 50 ml  Output -  Net 50 ml     Physical Exam  Gen: not in distress HEENT: no pallor, moist mucosa, supple neck Chest: diminished breath sounds b/l CVS: N S1&S2, no murmurs, rubs or gallop GI: distended protuberant belly, nontender Musculoskeletal: warm, 2 +pitting edema CNS: AAOX2, hard of hearing , mild flapping tremors    Data Review:    CBC Recent Labs  Lab 01/16/18 1626 01/17/18 1923 01/18/18 0235  WBC 7.8 6.1 7.3  HGB 12.0* 11.9* 11.4*  HCT 35.2* 35.2* 33.8*  PLT 141.0* 151 156  MCV 85.1 85.4 84.9  MCH  --  28.9 28.6  MCHC 34.1 33.8 33.7  RDW 15.0 14.6 14.6  LYMPHSABS 0.6*  --   --   MONOABS 0.9  --   --   EOSABS 0.1  --   --   BASOSABS 0.1  --   --     Chemistries  Recent Labs  Lab 01/16/18 1626 01/17/18 1923 01/18/18 0235  NA 121* 121* 122*  K 4.3 3.8 4.1  CL 85* 84* 86*  CO2 29 25 23   GLUCOSE 83 75 73  BUN 6 5* 5*  CREATININE 0.79 0.87 0.86  CALCIUM 8.9 8.3* 8.4*  AST 24 30  --   ALT 10 13  --   ALKPHOS 63 62  --   BILITOT 0.8 0.9  --    ------------------------------------------------------------------------------------------------------------------ No results for input(s): CHOL, HDL, LDLCALC, TRIG, CHOLHDL, LDLDIRECT in the last 72 hours.  Lab Results  Component Value Date   HGBA1C 5.6 03/03/2012   ------------------------------------------------------------------------------------------------------------------ Recent Labs    01/18/18 0235  TSH 1.669   ------------------------------------------------------------------------------------------------------------------ No results for input(s): VITAMINB12, FOLATE, FERRITIN, TIBC,  IRON, RETICCTPCT in the last 72 hours.  Coagulation profile Recent Labs  Lab 01/17/18 0020  INR 1.17    No results for input(s): DDIMER in the last 72 hours.  Cardiac Enzymes No results for input(s): CKMB, TROPONINI, MYOGLOBIN in the last 168 hours.  Invalid input(s):  CK ------------------------------------------------------------------------------------------------------------------    Component Value Date/Time   BNP 148.0 (H) 01/18/2018 0047   BNP 27.4 03/03/2012 1615    Inpatient Medications  Scheduled Meds: . aspirin EC  81 mg Oral Daily  . dextrose  1 ampule Intravenous Once  . enoxaparin (LOVENOX) injection  40 mg Subcutaneous Daily  . furosemide  40 mg Intravenous Q12H  . ipratropium-albuterol  3 mL Nebulization TID  . lactulose  20 g Oral BID  . loratadine  10 mg Oral Daily  . magnesium oxide  400 mg Oral Daily  . Melatonin  3 mg Oral QHS  . metoprolol tartrate  50 mg Oral BID  . multivitamin with minerals  1 tablet Oral Daily  . pantoprazole  40 mg Oral Daily  . spironolactone  50 mg Oral Daily  . thiamine  100 mg Oral Daily  . traZODone  75 mg Oral QHS  . vitamin B-12  1,000 mcg Oral Daily   Continuous Infusions: PRN Meds:.acetaminophen **OR** acetaminophen, albuterol, dextromethorphan-guaiFENesin, fluticasone, hydrALAZINE, lidocaine, methocarbamol, ondansetron **OR** ondansetron (ZOFRAN) IV, senna-docusate  Micro Results No results found for this or any previous visit (from the past 240 hour(s)).  Radiology Reports Dg Chest 2 View  Result Date: 01/16/2018 CLINICAL DATA:  Dyspnea on exertion. EXAM: CHEST - 2 VIEW COMPARISON:  Radiograph of November 10, 2017. FINDINGS: Stable cardiomegaly. No pneumothorax or pleural effusion is noted. No pneumothorax or pleural effusion is noted. Old bilateral rib fractures are noted. Right-sided PICC line has been removed. No consolidative process is noted. IMPRESSION: No active cardiopulmonary disease. Electronically Signed   By: Marijo Conception, M.D.   On: 01/16/2018 21:52   Ct Head Wo Contrast  Result Date: 01/18/2018 CLINICAL DATA:  Altered mental status. Confusion. EXAM: CT HEAD WITHOUT CONTRAST TECHNIQUE: Contiguous axial images were obtained from the base of the skull through the vertex  without intravenous contrast. COMPARISON:  Most recent comparison 07/29/2017 FINDINGS: Brain: No intracranial hemorrhage, mass effect, or midline shift. No hydrocephalus. The basilar cisterns are patent. No evidence of territorial infarct or acute ischemia. Unchanged degree of atrophy and chronic small vessel ischemia. No extra-axial or intracranial fluid collection. Vascular: Atherosclerosis of skullbase vasculature without hyperdense vessel or abnormal calcification. Skull: Upper cervical fusion hardware is partially included. No fracture or focal lesion. Sinuses/Orbits: Chronic mucosal thickening of the left frontal sinus with progression from prior exam. Chronic opacification of scattered left ethmoid air cells. Chronic opacification of scattered mastoid air cells with slight progression. Bilateral cataract resection. Other: None. IMPRESSION: 1. No acute intracranial abnormality. 2. Unchanged atrophy and chronic small vessel ischemia. 3. Chronic sinusitis with slight increased inflammation from July 19 CT. Electronically Signed   By: Keith Rake M.D.   On: 01/18/2018 00:24   Xr Elbow 2 Views Left  Result Date: 12/20/2017 Stable hardware fixation.  There is stable proximal olecranon fragment.   Time Spent in minutes  35   Kamorah Nevils M.D on 01/18/2018 at 8:48 AM  Between 7am to 7pm - Pager - [phone]  After 7pm go to [email or web address] - password [email or web address]  [facility] -  Office  [phone]

## 2018-01-18 NOTE — Telephone Encounter (Signed)
See message.

## 2018-01-18 NOTE — ED Notes (Signed)
Pt CBG was 115, notified Freida Busman and H. [place](RN)

## 2018-01-18 NOTE — Progress Notes (Signed)
Clarified with MD on cbg order and updated MD. Pt has not been seen by speech and has not had any PO medications today. New orders received will continue to monitor.

## 2018-01-18 NOTE — ED Notes (Signed)
Patient started to desaturate while sleeping.  Pt placed on 2lpm O2

## 2018-01-18 NOTE — ED Notes (Signed)
Pt's CBG 68, does not want food at this time - given 8 oz of OJ

## 2018-01-18 NOTE — ED Notes (Signed)
Patient transferred to room 5W20.  [place] notified

## 2018-01-18 NOTE — Telephone Encounter (Signed)
Patient's wife Merchandiser, retail) called to let Dr. Erlinda Hong know that patient is in the hospital at [facility] in the ER. [name] said they are drawing blood and they want to know if it's ok to draw blood from his left hand because they are having a hard time drawing blood from the right arm. Patient had surgery on his left elbow back in October 2019. The number to contact [name] is [phone]

## 2018-01-18 NOTE — Progress Notes (Signed)
Received report from ED RN. Pt not given PO medications due to dysphagia and pt choking on pill per report. Speech called and asked if pt could be seen today stated she would send out a page and ask if pt can be seen today.

## 2018-01-18 NOTE — Telephone Encounter (Signed)
That's fine

## 2018-01-18 NOTE — ED Notes (Signed)
IV team to draw labs

## 2018-01-19 DIAGNOSIS — I10 Essential (primary) hypertension: Secondary | ICD-10-CM

## 2018-01-19 LAB — COMPREHENSIVE METABOLIC PANEL
ALT: 13 U/L (ref 0–44)
AST: 24 U/L (ref 15–41)
Albumin: 3.6 g/dL (ref 3.5–5.0)
Alkaline Phosphatase: 57 U/L (ref 38–126)
Anion gap: 12 (ref 5–15)
BUN: <5 mg/dL — ABNORMAL LOW (ref 8–23)
CALCIUM: 8.7 mg/dL — AB (ref 8.9–10.3)
CO2: 33 mmol/L — ABNORMAL HIGH (ref 22–32)
Chloride: 85 mmol/L — ABNORMAL LOW (ref 98–111)
Creatinine, Ser: 1.11 mg/dL (ref 0.61–1.24)
GFR calc Af Amer: >60 mL/min (ref >60)
Glucose, Bld: 90 mg/dL (ref 70–99)
Potassium: 3.4 mmol/L — ABNORMAL LOW (ref 3.5–5.1)
Sodium: 130 mmol/L — ABNORMAL LOW (ref 135–145)
Total Bilirubin: 1.1 mg/dL (ref 0.3–1.2)
Total Protein: 6.5 g/dL (ref 6.5–8.1)

## 2018-01-19 LAB — CBC
HCT: 37.3 % — ABNORMAL LOW (ref 39.0–52.0)
HEMOGLOBIN: 12.5 g/dL — AB (ref 13.0–17.0)
MCH: 28.8 pg (ref 26.0–34.0)
MCHC: 33.5 g/dL (ref 30.0–36.0)
MCV: 85.9 fL (ref 80.0–100.0)
Platelets: 125 10*3/uL — ABNORMAL LOW (ref 150–400)
RBC: 4.34 MIL/uL (ref 4.22–5.81)
RDW: 14.6 % (ref 11.5–15.5)
WBC: 8 10*3/uL (ref 4.0–10.5)
nRBC: 0 % (ref 0.0–0.2)

## 2018-01-19 LAB — GLUCOSE, CAPILLARY
GLUCOSE-CAPILLARY: 83 mg/dL (ref 70–99)
Glucose-Capillary: 80 mg/dL (ref 70–99)
Glucose-Capillary: 90 mg/dL (ref 70–99)
Glucose-Capillary: 115 mg/dL — ABNORMAL HIGH (ref 70–99)
Glucose-Capillary: 142 mg/dL — ABNORMAL HIGH (ref 70–99)
Glucose-Capillary: 120 mg/dL — ABNORMAL HIGH (ref 70–99)

## 2018-01-19 MED ORDER — LACTULOSE 10 GM/15ML PO SOLN
20.0000 g | Freq: Three times a day (TID) | ORAL | Status: DC
  Administered 2018-01-19 – 2018-01-21 (×6): 20 g via ORAL
  Filled 2018-01-19 (×6): qty 30

## 2018-01-19 MED ORDER — POTASSIUM CHLORIDE 20 MEQ PO PACK
40.0000 meq | PACK | Freq: Every day | ORAL | Status: DC
  Administered 2018-01-19: 40 meq via ORAL
  Filled 2018-01-19 (×2): qty 2

## 2018-01-19 NOTE — Progress Notes (Signed)
Lactulose enema administered with medium stool output recorded

## 2018-01-19 NOTE — Evaluation (Signed)
Clinical/Bedside Swallow Evaluation Patient Details  Name: [name] MRN: [id number] Date of Birth: May 27, 1945  Today's Date: 01/19/2018 Time: SLP Start Time (ACUTE ONLY): 0930 SLP Stop Time (ACUTE ONLY): 1000 SLP Time Calculation (min) (ACUTE ONLY): 30 min  Past Medical History:  Past Medical History:  Diagnosis Date  . Adenomatous polyp of colon 2006  . Alcohol-induced persisting dementia ([place])   . Anemia 2012  . Anxiety   . Arthritis    "left arm/shoulder" (05/17/2016)  . Atrial tachycardia, paroxysmal ([place]) 04/07/2015  . BPH (benign prostatic hypertrophy) 5/99  . Chronic bronchitis ([place])    "q year or q other year" (11/02/2011)  . Cirrhosis of liver ([place])   . DCM (dilated cardiomyopathy) ([place])    EF 45-50% by echo and cath 2017  . Depression    Hx of  . Falls frequently    "daily lately; legs just give out" (11/02/2011; 05/17/2016)  . GERD (gastroesophageal reflux disease) 07/31/04   gastritis   . H/O hiatal hernia   . Hard of hearing   . Heart murmur   . Hernia, umbilical    "unrepaired" (11/02/2011; 05/17/2016)  . History of blood transfusion 2012  . HLD (hyperlipidemia) 5/99  . HTN (hypertension) 5/99  . Mild CAD    10% RCA  . PAC (premature atrial contraction) 04/07/2015  . Peripheral vascular disease ([place])   . Pneumonia 1996   hosp  . PONV (postoperative nausea and vomiting)   . Seasonal allergies    pollen / ragweed  . Wears glasses    Past Surgical History:  Past Surgical History:  Procedure Laterality Date  . BACK SURGERY    . CARDIAC CATHETERIZATION N/A 05/23/2015   Procedure: Left Heart Cath and Coronary Angiography;  Surgeon: Troy Sine, MD;  Location: [place] [facility];  Service: Cardiovascular;  Laterality: N/A;  . CATARACT EXTRACTION W/ INTRAOCULAR LENS  IMPLANT, BILATERAL Bilateral 10/2002  . COLONOSCOPY  07/31/04   multiple polyps; repeat in 3 years  . DUPUYTREN CONTRACTURE RELEASE Left   . ETT myoview  03/09/07   nml EF 66%  . FRACTURE  SURGERY    . HEMORRHOID SURGERY     "cauterized a long time ago" (11/02/2011)  . hosp CP R/O'D 2/24  03/08/07  . neck MRI  6/04   C5-6 disc abnormality  . ORIF ELBOW FRACTURE Left 10/27/2017   Procedure: OPEN REDUCTION INTERNAL FIXATION (ORIF) LEFT OLECRANON;  Surgeon: Leandrew Koyanagi, MD;  Location: [name];  Service: Orthopedics;  Laterality: Left;  . ORIF HUMERUS FRACTURE  11/04/2011   Procedure: OPEN REDUCTION INTERNAL FIXATION (ORIF) PROXIMAL HUMERUS FRACTURE;  Surgeon: Rozanna Box, MD;  Location: [place];  Service: Orthopedics;  Laterality: Left;  . POSTERIOR CERVICAL FUSION/FORAMINOTOMY N/A 03/21/2012   Procedure: POSTERIOR CERVICAL FUSION/FORAMINOTOMY LEVEL ONE;  Surgeon: Charlie Pitter, MD;  Location: [place] [facility];  Service: Neurosurgery;  Laterality: N/A;  POSTERIOR CERVICAL ONE TO CERVICAL TWO FUSION WITH ILIAC CREST GRAFT AND LATERAL MASS SCREWS   . TONSILLECTOMY     "I was young" (11/02/2011)  . TOTAL HIP ARTHROPLASTY Left 10/24/2016   Procedure: TOTAL HIP ARTHROPLASTY ANTERIOR APPROACH;  Surgeon: Rod Can, MD;  Location: [place];  Service: Orthopedics;  Laterality: Left;  . UPPER GASTROINTESTINAL ENDOSCOPY     HPI:  73 year old male admitted 01/17/18 with AMS, abnormal lab, SOB. PMH: ETOH abuse, alcoholic liver cirrhosis, alcoholic dementia, HTN, HLD, GERD, depression, anemia, BPH, PATach. CT, CXR negative  Assessment / Plan / Recommendation Clinical Impression  Pt is known to speech therapy services, having had FEES in November 2019, as well as additional evaluations before that time. Pt completed oral care after set up. Oral motor examination WFL, strong clear voice and volitional cough. Pt reports globus sensation following difficulty with a large pill, but usually tolerates regular solids and thin liquids at home.   Pt accepted trials of thin liquid, puree, and solid consistencies. No obvious oral difficulties or overt s/s aspiration observed on any consistency. Pt was given  sips of warm liquid to facilitate alleviation of globus sensation, however, this was ineffective. Suction was used to remove phlegm present in the back of the throat, which pt reports offered some relief from the sensation. At this time, recommend regular solids and thin liquids via cup or straw. Large pills should either be crushed or given in puree. Small pills may be given whole with liquid.   Pt's current swallowing issue appears to be three-fold -  1) if a large pill sat on pharyngeal soft tissue for a period of time, there could be irritation at the site.  2) globus sensation may be indicative of esophageal stasis or pause of bolus movement. Pt does have a history of GERD. RN reports pt is on Protonix in house.  3) a small amount of white phelgm was noted on velar surface, and there may have been additional secretion accumulation within the pharynx. Pt reports the globus sensation was alleviated somewhat with use of suction.    Pt was given written behavioral and dietary strategies for management of esophageal dysmotility, and was encouraged to adhere to these in addition to routine safe swallow precautions. This information was reviewed with RN and posted at [facility]. SLP will follow pt briefly for education. However, if globus sensation continues or worsens, consideration of an evaluation of esophageal motility is recommended.  SLP Visit Diagnosis: Dysphagia, unspecified (R13.10)    Aspiration Risk  Mild aspiration risk    Diet Recommendation     Liquid Administration via: Cup;Straw Medication Administration: (large pills crushed or with puree, small pills whole with liquid) Supervision: Patient able to self feed;Intermittent supervision to cue for compensatory strategies Compensations: Minimize environmental distractions;Small sips/bites;Slow rate;Follow solids with liquid Postural Changes: Seated upright at 90 degrees;Remain upright for at least 30 minutes after po intake    Other   Recommendations Recommended Consults: Consider esophageal assessment Oral Care Recommendations: Oral care BID   Follow up Recommendations (none anticipated. TBD)      Frequency and Duration min 1 x/week  1 week       Prognosis Prognosis for Safe Diet Advancement: Good Barriers to Reach Goals: (alcoholic dementia)      Swallow Study   General Date of Onset: 01/17/18 HPI: 73 year old male admitted 01/17/18 with AMS, abnormal lab, SOB. PMH: ETOH abuse, alcoholic liver cirrhosis, alcoholic dementia, HTN, HLD, GERD, depression, anemia, BPH, PATach. CT, CXR negative  Type of Study: Bedside Swallow Evaluation Previous Swallow Assessment: FEES 11/14/17 = D2/thin with upgrade as attn/awareness improved Diet Prior to this Study: NPO Temperature Spikes Noted: No Respiratory Status: Nasal cannula History of Recent Intubation: No Behavior/Cognition: Alert;Cooperative;Pleasant mood Oral Cavity Assessment: Within Functional Limits Oral Care Completed by SLP: Yes Oral Cavity - Dentition: Adequate natural dentition Vision: Functional for self-feeding Self-Feeding Abilities: Able to feed self Patient Positioning: Upright in bed Baseline Vocal Quality: Normal Volitional Cough: Strong Volitional Swallow: Able to elicit    Oral/Motor/Sensory Function Overall Oral  Motor/Sensory Function: Within functional limits   Ice Chips Ice chips: Within functional limits Presentation: Spoon   Thin Liquid Thin Liquid: Within functional limits Presentation: Straw    Nectar Thick Nectar Thick Liquid: Not tested   Honey Thick Honey Thick Liquid: Not tested   Puree Puree: Within functional limits Presentation: Self Fed;Spoon   Solid     Solid: Within functional limits Presentation: Self Fed;Spoon      B. Quentin Ore, [facility], [employer] Speech Language Pathologist [phone]  Shonna Chock 01/19/2018,10:14 AM

## 2018-01-19 NOTE — Telephone Encounter (Signed)
Called patient to advise  °

## 2018-01-19 NOTE — Progress Notes (Addendum)
PROGRESS NOTE                                                                                                                                                                                                             Patient Demographics:    [name], is a 73 y.o. male, DOB - 05/28/1945, WUJ:[id number]  Admit date - 01/17/2018   Admitting Physician Ivor Costa, MD  Outpatient Primary MD for the patient is Elby Beck, [place]  LOS - 2  Outpatient Specialists: None   Chief Complaint  Patient presents with  . Shortness of Breath  . 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  At baseline patient is hard of hearing but mostly oriented x3.  For the past several days he has become less interactive and confused.  He also reported shortness of breath on exertion.  Patient was seen by his PCP on the day prior to admission and found to be hyponatremic with sodium of 125.  His PCP started him on Lasix but since he was still confused and had a leg swellings with shortness of breath he was brought to the ED.  In the ED vitals were stable.  Sodium was found to be 121, WBC of 6.1, normal renal function, ammonia 47.  Head CT negative for acute findings. Patient admitted for further management of his acute hepatic encephalopathy, anasarca and hyponatremia.    Subjective:   Reports breathing much better from yesterday but still far from being normal.  Has diuresed quite well on Lasix.  Cleared for swallowing upon evaluation this morning.   Assessment  & Plan :    Principal Problem:    Hyponatremia Likely hypervolemic with decompensated cirrhosis. Improving with Lasix and fluid restriction. Continue diet with strict fluid restriction (200 cc/day).  Discontinue telemetry.  Active Problems: Decompensated liver cirrhosis. Anasarca improving with IV Lasix and Aldactone.  Negative balance of 5.8 L since admission.  Strict I's/O.  Strict I's/O.  Will need to establish with GI as outpatient.  (Saw Dr. Sharlett Iles remotely).  Reported being on alcohol remission since October 2019.  LFTs and INR normal.  Shortness of breath Possibly due to anasarca.  Improving with diuresis.  Acute hepatic encephalopathy Mental status improving to baseline.  Continue oral lactulose.  Hypoglycemia CBG found to be as low as 40s-70s.  No history of diabetes.  Monitor closely.  Diet resume.  Dysphasia Seen by SLP this morning.[name]  He had FEES study November 2019.  Diet resumed with instructions.  Anemia of chronic disease H&H currently stable.  Monitor  GERD Continue PPI       Code Status : DNR  Family Communication  : None at bedside.  Wife involved in care.  Disposition Plan  : Home possibly in 1-2 days if mental status continues to improve and diuresed adequately.  Barriers For Discharge : Active symptoms  Consults  : None  Procedures  : Head CT  DVT Prophylaxis  : Subcu Lovenox  Lab Results  Component Value Date   PLT 125 (L) 01/19/2018    Antibiotics  :    Anti-infectives (From admission, onward)   None        Objective:   Vitals:   01/18/18 2031 01/18/18 2144 01/19/18 0508 01/19/18 0814  BP: (!) 161/81  (!) 142/85   Pulse: 91  80   Resp: 18  19   Temp: 98.9 F (37.2 C)  98.7 F (37.1 C)   TempSrc:      SpO2: 98% 98% 100% 92%  Weight:      Height:        Wt Readings from Last 3 Encounters:  01/18/18 92.6 kg  01/16/18 97.5 kg  12/30/17 92.1 kg     Intake/Output Summary (Last 24 hours) at 01/19/2018 1401 Last data filed at 01/19/2018 0837 Gross per  24 hour  Intake -  Output 5850 ml  Net -5850 ml    Physical exam Fatigued, not in distress, hard of hearing HEENT: Moist mucosa, supple neck Chest: Fine bibasilar crackles bilaterally CVS: Normal S1-S2, no murmurs GI: Soft, distended protuberant belly (better since yesterday), nontender Musculoskeletal: Warm, 1+ pitting edema bilaterally CNS: Alert and oriented with mild tremors,    Data Review:    CBC Recent Labs  Lab 01/16/18 1626 01/17/18 1923 01/18/18 0235 01/19/18 0402  WBC 7.8 6.1 7.3 8.0  HGB 12.0* 11.9* 11.4* 12.5*  HCT 35.2* 35.2* 33.8* 37.3*  PLT 141.0* 151 156 125*  MCV 85.1 85.4 84.9 85.9  MCH  --  28.9 28.6 28.8  MCHC 34.1 33.8 33.7 33.5  RDW 15.0 14.6 14.6 14.6  LYMPHSABS 0.6*  --   --   --   MONOABS 0.9  --   --   --   EOSABS 0.1  --   --   --   BASOSABS 0.1  --   --   --     Chemistries  Recent Labs  Lab 01/16/18 1626 01/17/18 1923 01/18/18 0235 01/18/18 1222 01/18/18 1643 01/18/18 2057 01/19/18 0402  NA 121* 121* 122* 123* 126* 128* 130*  K 4.3 3.8 4.1 3.8 3.9 3.3* 3.4*  CL 85* 84* 86* 86* 86* 88* 85*  CO2 29 25 23 28 26 30  33*  GLUCOSE 83 75 73 102* 94 88 90  BUN 6 5* 5* 5* 5* <5* <5*  CREATININE 0.79 0.87 0.86 1.09 1.12 1.00 1.11  CALCIUM 8.9 8.3* 8.4* 8.5* 8.5* 8.4* 8.7*  AST 24 30  --   --   --   --  24  ALT 10 13  --   --   --   --  13  ALKPHOS 63 62  --   --   --   --  57  BILITOT 0.8 0.9  --   --   --   --  1.1   ------------------------------------------------------------------------------------------------------------------ No results for input(s): CHOL, HDL, LDLCALC, TRIG, CHOLHDL, LDLDIRECT in the last 72 hours.  Lab Results  Component Value Date   HGBA1C 5.6 03/03/2012   ------------------------------------------------------------------------------------------------------------------ Recent Labs    01/18/18 0235  TSH 1.669    ------------------------------------------------------------------------------------------------------------------ No results for input(s): VITAMINB12, FOLATE, FERRITIN, TIBC, IRON, RETICCTPCT in the last 72 hours.  Coagulation profile Recent Labs  Lab 01/17/18 0020 01/18/18 1643  INR 1.17 1.23    No results for input(s): DDIMER in the last 72 hours.  Cardiac Enzymes No results for input(s): CKMB, TROPONINI, MYOGLOBIN in the last 168 hours.  Invalid input(s): CK ------------------------------------------------------------------------------------------------------------------    Component Value Date/Time   BNP 148.0 (H) 01/18/2018 0047   BNP 27.4 03/03/2012 1615    Inpatient Medications  Scheduled Meds: . aspirin EC  81 mg Oral Daily  . Chlorhexidine Gluconate Cloth  6 each Topical Q0600  . dextrose  1 ampule Intravenous Once  . enoxaparin (LOVENOX) injection  40 mg Subcutaneous Daily  . furosemide  40 mg Intravenous Q12H  . ipratropium-albuterol  3 mL Nebulization TID  . lactulose  300 mL Rectal BID  . loratadine  10 mg Oral Daily  . magnesium oxide  400 mg Oral Daily  . mouth rinse  15 mL Mouth Rinse BID  . Melatonin  3 mg Oral QHS  . metoprolol tartrate  50 mg Oral BID  . multivitamin with minerals  1 tablet Oral Daily  . mupirocin ointment  1 application Nasal BID  . pantoprazole  40 mg Oral Daily  . potassium chloride  40 mEq Oral Daily  . spironolactone  50 mg Oral Daily  . thiamine  100 mg Oral Daily  . traZODone  75 mg Oral QHS  . vitamin B-12  1,000 mcg Oral Daily   Continuous Infusions: PRN Meds:.acetaminophen **OR** acetaminophen, albuterol, dextromethorphan-guaiFENesin, fluticasone, hydrALAZINE, lidocaine, methocarbamol, ondansetron **OR** ondansetron (ZOFRAN) IV, senna-docusate  Micro Results Recent Results (from the past 240 hour(s))  MRSA PCR Screening     Status: Abnormal   Collection Time: 01/18/18  4:53 PM  Result Value Ref Range Status    MRSA by PCR POSITIVE (A) NEGATIVE Final    Comment:        The GeneXpert MRSA Assay (FDA approved for NASAL specimens only), is one component of a comprehensive MRSA colonization surveillance program. It is not intended to diagnose MRSA infection nor to guide or monitor treatment for MRSA infections. RESULT CALLED TO, READ BACK BY AND VERIFIED WITH: Jarrett Soho RN 01/18/18 1845 JDW Performed at [facility] [address], [place], [place] [address]     Radiology Reports Dg Chest 2 View  Result Date: 01/16/2018 CLINICAL DATA:  Dyspnea on exertion. EXAM: CHEST - 2 VIEW COMPARISON:  Radiograph of November 10, 2017. FINDINGS: Stable cardiomegaly. No pneumothorax or pleural effusion is noted. No pneumothorax or pleural effusion is noted. Old bilateral rib fractures are noted. Right-sided PICC line has been removed. No consolidative process is noted. IMPRESSION: No active cardiopulmonary disease. Electronically Signed   By: Marijo Conception, M.D.   On: 01/16/2018 21:52   Ct Head Wo Contrast  Result Date: 01/18/2018 CLINICAL DATA:  Altered  mental status. Confusion. EXAM: CT HEAD WITHOUT CONTRAST TECHNIQUE: Contiguous axial images were obtained from the base of the skull through the vertex without intravenous contrast. COMPARISON:  Most recent comparison 07/29/2017 FINDINGS: Brain: No intracranial hemorrhage, mass effect, or midline shift. No hydrocephalus. The basilar cisterns are patent. No evidence of territorial infarct or acute ischemia. Unchanged degree of atrophy and chronic small vessel ischemia. No extra-axial or intracranial fluid collection. Vascular: Atherosclerosis of skullbase vasculature without hyperdense vessel or abnormal calcification. Skull: Upper cervical fusion hardware is partially included. No fracture or focal lesion. Sinuses/Orbits: Chronic mucosal thickening of the left frontal sinus with progression from prior exam. Chronic opacification of scattered left ethmoid air  cells. Chronic opacification of scattered mastoid air cells with slight progression. Bilateral cataract resection. Other: None. IMPRESSION: 1. No acute intracranial abnormality. 2. Unchanged atrophy and chronic small vessel ischemia. 3. Chronic sinusitis with slight increased inflammation from July 19 CT. Electronically Signed   By: Keith Rake M.D.   On: 01/18/2018 00:24    Time Spent in minutes  25   Raveen Wieseler M.D on 01/19/2018 at 2:01 PM  Between 7am to 7pm - Pager - [phone]  After 7pm go to [email or web address] - password [facility]  [facility] -  Office  [phone]

## 2018-01-19 NOTE — Telephone Encounter (Signed)
Patient's wife

## 2018-01-20 ENCOUNTER — Ambulatory Visit: Payer: Medicare Other | Admitting: Family Medicine

## 2018-01-20 ENCOUNTER — Inpatient Hospital Stay (HOSPITAL_COMMUNITY): Payer: Medicare Other

## 2018-01-20 DIAGNOSIS — R0602 Shortness of breath: Secondary | ICD-10-CM

## 2018-01-20 DIAGNOSIS — E876 Hypokalemia: Secondary | ICD-10-CM

## 2018-01-20 LAB — GLUCOSE, CAPILLARY
Glucose-Capillary: 118 mg/dL — ABNORMAL HIGH (ref 70–99)
Glucose-Capillary: 102 mg/dL — ABNORMAL HIGH (ref 70–99)
Glucose-Capillary: 105 mg/dL — ABNORMAL HIGH (ref 70–99)
Glucose-Capillary: 108 mg/dL — ABNORMAL HIGH (ref 70–99)
Glucose-Capillary: 140 mg/dL — ABNORMAL HIGH (ref 70–99)
Glucose-Capillary: 120 mg/dL — ABNORMAL HIGH (ref 70–99)

## 2018-01-20 LAB — BASIC METABOLIC PANEL
Anion gap: 12 (ref 5–15)
BUN: 7 mg/dL — ABNORMAL LOW (ref 8–23)
CO2: 34 mmol/L — ABNORMAL HIGH (ref 22–32)
Calcium: 8.4 mg/dL — ABNORMAL LOW (ref 8.9–10.3)
Chloride: 86 mmol/L — ABNORMAL LOW (ref 98–111)
Creatinine, Ser: 1.23 mg/dL (ref 0.61–1.24)
GFR calc Af Amer: >60 mL/min (ref >60)
GFR calc non Af Amer: 59 mL/min — ABNORMAL LOW (ref >60)
Glucose, Bld: 108 mg/dL — ABNORMAL HIGH (ref 70–99)
POTASSIUM: 2.9 mmol/L — AB (ref 3.5–5.1)
Sodium: 132 mmol/L — ABNORMAL LOW (ref 135–145)

## 2018-01-20 LAB — ECHOCARDIOGRAM COMPLETE
Height: 64 in
Weight: 3266.34 oz

## 2018-01-20 LAB — MAGNESIUM: Magnesium: 1.5 mg/dL — ABNORMAL LOW (ref 1.7–2.4)

## 2018-01-20 MED ORDER — PERFLUTREN LIPID MICROSPHERE
1.0000 mL | INTRAVENOUS | Status: AC | PRN
  Administered 2018-01-20: 4 mL via INTRAVENOUS
  Filled 2018-01-20: qty 10

## 2018-01-20 MED ORDER — POTASSIUM CHLORIDE 20 MEQ PO PACK
40.0000 meq | PACK | ORAL | Status: AC
  Administered 2018-01-20 (×3): 40 meq via ORAL
  Filled 2018-01-20 (×3): qty 2

## 2018-01-20 MED ORDER — IPRATROPIUM-ALBUTEROL 0.5-2.5 (3) MG/3ML IN SOLN
3.0000 mL | Freq: Two times a day (BID) | RESPIRATORY_TRACT | Status: DC
  Administered 2018-01-20 – 2018-01-21 (×2): 3 mL via RESPIRATORY_TRACT
  Filled 2018-01-20 (×2): qty 3

## 2018-01-20 MED ORDER — MAGNESIUM SULFATE 4 GM/100ML IV SOLN
4.0000 g | Freq: Once | INTRAVENOUS | Status: AC
  Administered 2018-01-20: 4 g via INTRAVENOUS
  Filled 2018-01-20: qty 100

## 2018-01-20 NOTE — Progress Notes (Signed)
PROGRESS NOTE                                                                                                                                                                                                             Patient Demographics:    [name], is a 73 y.o. male, DOB - 1945-03-13, LFY:[id number]  Admit date - 01/17/2018   Admitting Physician Ivor Costa, MD  Outpatient Primary MD for the patient is Elby Beck, [place]  LOS - 3  Outpatient Specialists: None   Chief Complaint  Patient presents with  . Shortness of Breath  . 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  At baseline patient is hard of hearing but mostly oriented x3.  For the past several days he has become less interactive and confused.  He also reported shortness of breath on exertion.  Patient was seen by his PCP on the day prior to admission and found to be hyponatremic with sodium of 125.  His PCP started him on Lasix but since he was still confused and had a leg swellings with shortness of breath he was brought to the ED.  In the ED vitals were stable.  Sodium was found to be 121, WBC of 6.1, normal renal function, ammonia 47.  Head CT negative for acute findings. Patient admitted for further management of his acute hepatic encephalopathy, anasarca and hyponatremia.    Subjective:   Breathing continues to improve.  Diuresing quite well.   Assessment  & Plan :    Principal Problem:   Hyponatremia Likely hypervolemic with decompensated cirrhosis. Continues to improve with Lasix and fluid  restriction.   Active Problems: Decompensated liver cirrhosis. Anasarca improving with IV Lasix and Aldactone.  Negative balance of 5.8 L since admission.  Strict I's/O.  Strict I's/O.  Will need to establish with GI as outpatient.  (Saw Dr. Sharlett Iles remotely).  Reported being on alcohol remission since October 2019.  LFTs and INR normal.   Hypokalemia/hypomagnesemia  replenished  Shortness of breath Possibly due to anasarca.  Improving with diuresis.  May have component of diastolic dysfunction since his LFTs and INR is normal.  Will check 2D echo.  Acute hepatic encephalopathy Continue lactulose.  Currently resolved.  Hypoglycemia CBG in low during admission.  No history of diabetes.  Currently normal.  Dysphasia Seen by SLP this morning.[name]  He had FEES study November 2019.  Diet resumed with instructions.  Anemia of chronic disease H&H currently stable.  Monitor  GERD Continue PPI       Code Status : DNR  Family Communication  : Wife involved in care.  Disposition Plan  : Home possibly tomorrow if continues to improve and diuresed well.  Barriers For Discharge : Active symptoms  Consults  : None  Procedures  : Head CT  DVT Prophylaxis  : Subcu Lovenox  Lab Results  Component Value Date   PLT 125 (L) 01/19/2018    Antibiotics  :    Anti-infectives (From admission, onward)   None        Objective:   Vitals:   01/20/18 0546 01/20/18 0809 01/20/18 0854 01/20/18 1254  BP: 115/64  (!) 149/86 (!) 142/87  Pulse: 78  85 75  Resp: 19     Temp: 98.8 F (37.1 C)   98.1 F (36.7 C)  TempSrc: Oral     SpO2: 91% 92%  98%  Weight:      Height:        Wt Readings from Last 3 Encounters:  01/18/18 92.6 kg  01/16/18 97.5 kg  12/30/17 92.1 kg     Intake/Output Summary (Last 24 hours) at 01/20/2018 1346 Last data filed at 01/20/2018 1225 Gross per 24 hour  Intake 240 ml  Output 2125 ml  Net -1885 ml   Physical exam Not in distress HEENT: Moist  mucosa, supple neck Chest: Improved bibasilar crackles CVS: Normal S1 and S2 GI: Soft, abdominal distention much improved, nontender Musculoskeletal, warm, trace edema CNS: Alert and oriented, no tremors    Data Review:    CBC Recent Labs  Lab 01/16/18 1626 01/17/18 1923 01/18/18 0235 01/19/18 0402  WBC 7.8 6.1 7.3 8.0  HGB 12.0* 11.9* 11.4* 12.5*  HCT 35.2* 35.2* 33.8* 37.3*  PLT 141.0* 151 156 125*  MCV 85.1 85.4 84.9 85.9  MCH  --  28.9 28.6 28.8  MCHC 34.1 33.8 33.7 33.5  RDW 15.0 14.6 14.6 14.6  LYMPHSABS 0.6*  --   --   --   MONOABS 0.9  --   --   --   EOSABS 0.1  --   --   --   BASOSABS 0.1  --   --   --     Chemistries  Recent Labs  Lab 01/16/18 1626 01/17/18 1923  01/18/18 1222 01/18/18 1643 01/18/18 2057 01/19/18 0402 01/20/18 0558 01/20/18 0800  NA 121* 121*   < > 123* 126* 128* 130* 132*  --   K 4.3 3.8   < > 3.8 3.9 3.3* 3.4* 2.9*  --   CL 85* 84*   < > 86* 86* 88* 85* 86*  --   CO2 29 25   < > 28 26 30  33* 34*  --   GLUCOSE 83 75   < > 102* 94 88 90 108*  --   BUN 6 5*   < > 5* 5* <5* <5* 7*  --   CREATININE 0.79 0.87   < > 1.09 1.12 1.00 1.11 1.23  --   CALCIUM 8.9 8.3*   < >  8.5* 8.5* 8.4* 8.7* 8.4*  --   MG  --   --   --   --   --   --   --   --  1.5*  AST 24 30  --   --   --   --  24  --   --   ALT 10 13  --   --   --   --  13  --   --   ALKPHOS 63 62  --   --   --   --  57  --   --   BILITOT 0.8 0.9  --   --   --   --  1.1  --   --    < > = values in this interval not displayed.   ------------------------------------------------------------------------------------------------------------------ No results for input(s): CHOL, HDL, LDLCALC, TRIG, CHOLHDL, LDLDIRECT in the last 72 hours.  Lab Results  Component Value Date   HGBA1C 5.6 03/03/2012   ------------------------------------------------------------------------------------------------------------------ Recent Labs    01/18/18 0235  TSH 1.669    ------------------------------------------------------------------------------------------------------------------ No results for input(s): VITAMINB12, FOLATE, FERRITIN, TIBC, IRON, RETICCTPCT in the last 72 hours.  Coagulation profile Recent Labs  Lab 01/17/18 0020 01/18/18 1643  INR 1.17 1.23    No results for input(s): DDIMER in the last 72 hours.  Cardiac Enzymes No results for input(s): CKMB, TROPONINI, MYOGLOBIN in the last 168 hours.  Invalid input(s): CK ------------------------------------------------------------------------------------------------------------------    Component Value Date/Time   BNP 148.0 (H) 01/18/2018 0047   BNP 27.4 03/03/2012 1615    Inpatient Medications  Scheduled Meds: . aspirin EC  81 mg Oral Daily  . Chlorhexidine Gluconate Cloth  6 each Topical Q0600  . dextrose  1 ampule Intravenous Once  . enoxaparin (LOVENOX) injection  40 mg Subcutaneous Daily  . furosemide  40 mg Intravenous Q12H  . ipratropium-albuterol  3 mL Nebulization BID  . lactulose  20 g Oral TID  . loratadine  10 mg Oral Daily  . magnesium oxide  400 mg Oral Daily  . mouth rinse  15 mL Mouth Rinse BID  . Melatonin  3 mg Oral QHS  . metoprolol tartrate  50 mg Oral BID  . multivitamin with minerals  1 tablet Oral Daily  . mupirocin ointment  1 application Nasal BID  . pantoprazole  40 mg Oral Daily  . potassium chloride  40 mEq Oral Q4H  . spironolactone  50 mg Oral Daily  . thiamine  100 mg Oral Daily  . traZODone  75 mg Oral QHS  . vitamin B-12  1,000 mcg Oral Daily   Continuous Infusions: PRN Meds:.acetaminophen **OR** acetaminophen, albuterol, dextromethorphan-guaiFENesin, fluticasone, hydrALAZINE, lidocaine, methocarbamol, ondansetron **OR** ondansetron (ZOFRAN) IV, senna-docusate  Micro Results Recent Results (from the past 240 hour(s))  MRSA PCR Screening     Status: Abnormal   Collection Time: 01/18/18  4:53 PM  Result Value Ref Range Status   MRSA by  PCR POSITIVE (A) NEGATIVE Final    Comment:        The GeneXpert MRSA Assay (FDA approved for NASAL specimens only), is one component of a comprehensive MRSA colonization surveillance program. It is not intended to diagnose MRSA infection nor to guide or monitor treatment for MRSA infections. RESULT CALLED TO, READ BACK BY AND VERIFIED WITH: Jarrett Soho RN 01/18/18 1845 JDW Performed at [facility] [address] [place], [place] [address]     Radiology Reports Dg Chest 2 View  Result  Date: 01/16/2018 CLINICAL DATA:  Dyspnea on exertion. EXAM: CHEST - 2 VIEW COMPARISON:  Radiograph of November 10, 2017. FINDINGS: Stable cardiomegaly. No pneumothorax or pleural effusion is noted. No pneumothorax or pleural effusion is noted. Old bilateral rib fractures are noted. Right-sided PICC line has been removed. No consolidative process is noted. IMPRESSION: No active cardiopulmonary disease. Electronically Signed   By: Marijo Conception, M.D.   On: 01/16/2018 21:52   Ct Head Wo Contrast  Result Date: 01/18/2018 CLINICAL DATA:  Altered mental status. Confusion. EXAM: CT HEAD WITHOUT CONTRAST TECHNIQUE: Contiguous axial images were obtained from the base of the skull through the vertex without intravenous contrast. COMPARISON:  Most recent comparison 07/29/2017 FINDINGS: Brain: No intracranial hemorrhage, mass effect, or midline shift. No hydrocephalus. The basilar cisterns are patent. No evidence of territorial infarct or acute ischemia. Unchanged degree of atrophy and chronic small vessel ischemia. No extra-axial or intracranial fluid collection. Vascular: Atherosclerosis of skullbase vasculature without hyperdense vessel or abnormal calcification. Skull: Upper cervical fusion hardware is partially included. No fracture or focal lesion. Sinuses/Orbits: Chronic mucosal thickening of the left frontal sinus with progression from prior exam. Chronic opacification of scattered left ethmoid air cells.  Chronic opacification of scattered mastoid air cells with slight progression. Bilateral cataract resection. Other: None. IMPRESSION: 1. No acute intracranial abnormality. 2. Unchanged atrophy and chronic small vessel ischemia. 3. Chronic sinusitis with slight increased inflammation from July 19 CT. Electronically Signed   By: Keith Rake M.D.   On: 01/18/2018 00:24    Time Spent in minutes  25   Sianni Cloninger M.D on 01/20/2018 at 1:46 PM  Between 7am to 7pm - Pager - [phone]  After 7pm go to [email or web address] - password [facility]  [facility] -  Office  [phone]

## 2018-01-21 DIAGNOSIS — R0609 Other forms of dyspnea: Secondary | ICD-10-CM

## 2018-01-21 DIAGNOSIS — R601 Generalized edema: Secondary | ICD-10-CM

## 2018-01-21 LAB — BASIC METABOLIC PANEL
Anion gap: 10 (ref 5–15)
BUN: 10 mg/dL (ref 8–23)
CO2: 32 mmol/L (ref 22–32)
CREATININE: 1.24 mg/dL (ref 0.61–1.24)
Calcium: 8.6 mg/dL — ABNORMAL LOW (ref 8.9–10.3)
Chloride: 89 mmol/L — ABNORMAL LOW (ref 98–111)
GFR calc Af Amer: >60 mL/min (ref >60)
GFR calc non Af Amer: 58 mL/min — ABNORMAL LOW (ref >60)
Glucose, Bld: 104 mg/dL — ABNORMAL HIGH (ref 70–99)
Potassium: 3.6 mmol/L (ref 3.5–5.1)
Sodium: 131 mmol/L — ABNORMAL LOW (ref 135–145)

## 2018-01-21 LAB — GLUCOSE, CAPILLARY
GLUCOSE-CAPILLARY: 112 mg/dL — AB (ref 70–99)
Glucose-Capillary: 102 mg/dL — ABNORMAL HIGH (ref 70–99)

## 2018-01-21 MED ORDER — LACTULOSE 10 GM/15ML PO SOLN: 10.0000 g | Freq: Two times a day (BID) | ORAL | 0 refills | Status: DC | PRN

## 2018-01-21 MED ORDER — FUROSEMIDE 20 MG PO TABS: 40.0000 mg | ORAL_TABLET | Freq: Every day | ORAL | 3 refills | Status: DC

## 2018-01-21 MED ORDER — SPIRONOLACTONE 50 MG PO TABS: 50.0000 mg | ORAL_TABLET | Freq: Every day | ORAL | 0 refills | Status: DC

## 2018-01-21 NOTE — Discharge Instructions (Signed)
Hepatic Encephalopathy ° °Hepatic encephalopathy is a loss of brain function due to advanced liver disease. When the liver is damaged, harmful substances (toxins) can build up in the body. Some of these toxins, such as ammonia, can harm the brain. °The effects of the condition depend on the type of liver damage and how severe it is. In some cases, hepatic encephalopathy can be reversed. °What are the causes? °Certain things can trigger or worsen hepatic encephalopathy, such as: °· Infection. °· Constipation. °· Taking certain medicines, such as benzodiazepines. °· Alcohol use. °· Bleeding into the intestinal tract. °· Imbalances in minerals (electrolytes) in the body. °· Dehydration. °Hepatic encephalopathy can sometimes be reversed if these triggers are resolved. °What increases the risk? °You are at risk of developing this condition if you have advanced liver disease (cirrhosis). Conditions that can cause liver disease include: °· Infections in the liver, such as hepatitis C. °· Infections in the blood. °· Drinking a lot of alcohol over a long period of time. °· Taking certain medicines, including tranquilizers, diuretics, antidepressants, sleeping pills, or acetaminophen. °· Genetic diseases, such as Wilson's disease. °What are the signs or symptoms? °Symptoms may develop suddenly. Or, they may develop slowly and get worse gradually. Symptoms can range from mild to severe. °Mild symptoms include: °· Mild confusion. °· Shortened attention span. °· Personality and mood changes. °· Anxiety and agitation. °· Drowsiness. °Symptoms of worsening or severe hepatic encephalopathy include: °· Extreme confusion (disorientation). °· Slowed movement. °· Slurred speech. °· Extreme personality changes. °· Abnormal shaking or flapping of the hands. °· Coma. °How is this diagnosed? °This condition may be diagnosed based on: °· A physical exam. °· Your symptoms and medical history. °· Blood tests. These may be done to check levels  of ammonia in your blood, measure how long it takes your blood to clot, or check for infection. °· Liver function tests. These may be done to check how well your liver is working. °· MRI and CT scans. These may be done to check for a brain disorder and to check for problems with your liver. °· Electroencephalogram (EEG). This test measures the electrical activity in your brain. °How is this treated? °The first step in treatment is to identify and treat the cause of your liver damage or triggering illness, if possible. The next step is taking medicine to lower the level of toxins in your body and prevent ammonia from building up. Treatment will depend on how severe your encephalopathy is, and may include: °· Medicine to lower your ammonia level (lactulose). °· Antibiotic medicine to reduce the amount of ammonia-producing bacteria in your gut. °· Close monitoring of your blood pressure, heart rate, breathing, and oxygen levels. °· Removal of fluid from your abdomen. °· Close monitoring of how you think, feel, and act (mental status). °· Dietary changes. °· Liver transplant, in severe cases. °Follow these instructions at home: °Eating and drinking ° °· Work with a dietitian or with your health care provider to make sure you are getting the right balance of protein and minerals. °· Drink enough fluids to keep your urine pale yellow. °· Do not drink alcohol or use drugs. °General instructions °· If you were prescribed an antibiotic, take it as told by your health care provider. Do not stop taking the antibiotic even if your condition improves. °· Take other over-the-counter and prescription medicines only as told by your health care provider. °· Do not start taking any new medicines, including over-the-counter medicines, without first   checking with your health care provider. °· Keep all follow-up visits as told by your health care provider. This is important. °Contact a health care provider if: °· You develop new  symptoms. °· Your symptoms change or get worse. °· You have a fever. °· You are constipated. Signs of constipation include having: °? Fewer bowel movements in a week than normal. °? Trouble having a bowel movement. °? Stools that are dry, hard, or larger than normal. °· You have persistent nausea, vomiting, or diarrhea. °Get help right away if: °· You become very confused or drowsy. °· You vomit blood or material that looks like coffee grounds. °· Your stool is bloody, black, or looks like tar. °Summary °· Hepatic encephalopathy is a loss of brain function due to advanced liver disease. When the liver is damaged, harmful substances (toxins) can build up in your body. Some of these toxins, such as ammonia, can harm your brain. °· Certain things can trigger or worsen hepatic encephalopathy. Hepatic encephalopathy can sometimes be reversed if these triggers are resolved. °· The first step in treatment is to identify and treat the cause of your liver damage or triggering illness, if possible. The next step is taking medicine to lower the level of toxins in your body and prevent ammonia from building up. °· Your treatment will depend on how severe your hepatic encephalopathy is. °This information is not intended to replace advice given to you by your health care provider. Make sure you discuss any questions you have with your health care provider. °Document Released: 03/09/2006 Document Revised: 09/28/2016 Document Reviewed: 09/28/2016 °[employer] Interactive Patient Education © 2019 [employer] ° °

## 2018-01-21 NOTE — Discharge Summary (Addendum)
Physician Discharge Summary  [name] BZJ:[id number] DOB: 1945/04/15 DOA: 01/17/2018  PCP: Elby Beck, FNP  Admit date: 01/17/2018 Discharge date: 01/21/2018  Admitted From: Home Disposition: Home   Recommendations for Outpatient Follow-up:  1. Follow up with PCP in 1-2 weeks 2. Follow-up with GI (Dr. Hilarie Fredrickson) in 4 weeks.  Home Health: None Equipment/Devices: None  Discharge Condition: Fair CODE STATUS: DNR Diet recommendation: Regular with fluid restriction to no greater than 2 L/day   Discharge Diagnoses:  Principal Problem:   Acute hepatic encephalopathy  Active Problems: Alcoholic cirrhosis of liver with ascites   Hyponatremia   Hypokalemia   Essential hypertension   Anemia   Hypoglycemia   GERD (gastroesophageal reflux disease)   SOB (shortness of breath) Anasarca  Brief narrative/HPI Please refer to admission H&P for details, in brief,73 year old male with history of alcoholic liver cirrhosis with associated dementia, 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  At baseline patient is hard of hearing but mostly oriented x3.  For the past several days he has become less interactive and confused.  He also reported shortness of breath on exertion.  Patient was seen by his PCP on the day prior to admission and found to be hyponatremic with sodium of 125.  His PCP started him on Lasix but since he was still confused and had a leg swellings with shortness of breath he was brought to the ED.  In the ED vitals were stable.  Sodium was found to be 121, WBC of 6.1, normal renal function, ammonia 47.  Head CT negative for acute findings. Patient admitted for further management of his acute hepatic encephalopathy, anasarca and hyponatremia.  Hospital course Principal  Problem:   Hyponatremia with acute hepatic encephalopathy Likely hypervolemic with decompensated cirrhosis. Sodium level significantly improved with Lasix, Aldactone and strict fluid restriction.  Hepatic encephalopathy improved with lactulose.  Mental status back to normal.   Active Problems: Decompensated liver cirrhosis with hepatic encephalopathy and anasarca. Diuresed significantly well with IV Lasix and Aldactone with negative balance of 8.8 L since admission.  LFTs and INR normal.  Has been on alcohol remission since October 2019.   Will discharge him on oral Lasix and Aldactone along with lactulose. Patient should follow-up with [facility] (Dr. Hilarie Fredrickson, who had seen him in the past and performed EGD/colonoscopy) in about 4 weeks.   Hypokalemia/hypomagnesemia  replenished  Shortness of breath Possibly due to anasarca.  Improving with diuresis.    2D echo done with low normal EF of 50-55% and no wall motion abnormality.   Hypoglycemia CBG in low during admission.  No history of diabetes and improved after diet resumed.  Dysphasia Seen by SLP this morning.[name]  He had FEES study November 2019.  Diet resumed with instructions.  Anemia of chronic disease H&H currently stable.    GERD Continue PPI  History of left olecranon fracture Status post repair and currently getting physical therapy as outpatient.  Essential hypertension Stable.  Resume home meds  Patient is clinically stable to be discharged home with outpatient follow-up.  Family Communication  : Wife at bedside  Disposition Plan  : Home  Consults  : None  Procedures  : Head CT  Discharge Instructions   Allergies as of 01/21/2018      Reactions   Nifedipine Swelling   11/02/2011 pt denies  this allergy (??)   Bee Venom Swelling      Medication List    STOP taking these medications   senna-docusate 8.6-50 MG tablet Commonly known as:  SENOKOT S     TAKE these medications   aspirin EC  81 MG tablet Take 81 mg by mouth daily. What changed:  Another medication with the same name was removed. Continue taking this medication, and follow the directions you see here.   cetirizine 10 MG tablet Commonly known as:  ZYRTEC Take 1 tablet (10 mg total) by mouth daily.   fluticasone 50 MCG/ACT nasal spray Commonly known as:  FLONASE Place 1 spray into both nostrils 2 (two) times daily as needed for allergies.   folic acid 1 MG tablet Commonly known as:  FOLVITE Take 1 tablet (1 mg total) by mouth daily.   furosemide 20 MG tablet Commonly known as:  LASIX Take 2 tablets (40 mg total) by mouth daily. What changed:  how much to take   ibuprofen 400 MG tablet Commonly known as:  MOTRIN IB Take 1 tablet (400 mg total) by mouth every 6 (six) hours as needed for moderate pain (neck pain).   lactulose 10 GM/15ML solution Commonly known as:  CHRONULAC Take 15 mLs (10 g total) by mouth 2 (two) times daily as needed for mild constipation (titrate to at least 2-3 loose BM daily).   lidocaine 5 % ointment Commonly known as:  XYLOCAINE Apply 1 application topically as needed. What changed:    when to take this  reasons to take this   magnesium oxide 400 (241.3 Mg) MG tablet Commonly known as:  MAG-OX Take 1 tablet (400 mg total) by mouth daily.   Melatonin 5 MG Tabs Take 20 mg by mouth at bedtime.   methocarbamol 750 MG tablet Commonly known as:  ROBAXIN Take 1 tablet (750 mg total) by mouth 2 (two) times daily as needed for muscle spasms.   metoprolol tartrate 25 MG tablet Commonly known as:  LOPRESSOR Take 1 tablet (25 mg total) by mouth 2 (two) times daily. What changed:  how much to take   multivitamin with minerals Tabs tablet Take 1 tablet by mouth daily.   ondansetron 4 MG tablet Commonly known as:  ZOFRAN Take 1-2 tablets (4-8 mg total) by mouth every 8 (eight) hours as needed for nausea or vomiting.   pantoprazole 40 MG tablet Commonly known as:   PROTONIX Take 1 tablet (40 mg total) by mouth daily.   promethazine 25 MG tablet Commonly known as:  PHENERGAN Take 1 tablet (25 mg total) by mouth every 6 (six) hours as needed for nausea.   spironolactone 50 MG tablet Commonly known as:  ALDACTONE Take 1 tablet (50 mg total) by mouth daily. Start taking on:  January 22, 2018   thiamine 100 MG tablet Commonly known as:  VITAMIN B-1 Take 100 mg by mouth daily. Reported on 02/17/2015   traZODone 50 MG tablet Commonly known as:  DESYREL Take 75 mg by mouth at bedtime.   VITAMIN B-12 PO Take 1 tablet by mouth daily.      Follow-up Information    Elby Beck, FNP. Schedule an appointment as soon as possible for a visit in 1 week(s).   Specialties:  Nurse Practitioner, Family Medicine Contact information: [place] [address] [phone]        Jerene Bears, MD Follow up in 4 week(s).   Specialty:  Gastroenterology Contact information:  [address] [address] [phone]          Allergies  Allergen Reactions  . Nifedipine Swelling    11/02/2011 pt denies this allergy (??)  . Bee Venom Swelling     Procedures/Studies: Dg Chest 2 View  Result Date: 01/16/2018 CLINICAL DATA:  Dyspnea on exertion. EXAM: CHEST - 2 VIEW COMPARISON:  Radiograph of November 10, 2017. FINDINGS: Stable cardiomegaly. No pneumothorax or pleural effusion is noted. No pneumothorax or pleural effusion is noted. Old bilateral rib fractures are noted. Right-sided PICC line has been removed. No consolidative process is noted. IMPRESSION: No active cardiopulmonary disease. Electronically Signed   By: Marijo Conception, M.D.   On: 01/16/2018 21:52   Ct Head Wo Contrast  Result Date: 01/18/2018 CLINICAL DATA:  Altered mental status. Confusion. EXAM: CT HEAD WITHOUT CONTRAST TECHNIQUE: Contiguous axial images were obtained from the base of the skull through the vertex without intravenous contrast. COMPARISON:   Most recent comparison 07/29/2017 FINDINGS: Brain: No intracranial hemorrhage, mass effect, or midline shift. No hydrocephalus. The basilar cisterns are patent. No evidence of territorial infarct or acute ischemia. Unchanged degree of atrophy and chronic small vessel ischemia. No extra-axial or intracranial fluid collection. Vascular: Atherosclerosis of skullbase vasculature without hyperdense vessel or abnormal calcification. Skull: Upper cervical fusion hardware is partially included. No fracture or focal lesion. Sinuses/Orbits: Chronic mucosal thickening of the left frontal sinus with progression from prior exam. Chronic opacification of scattered left ethmoid air cells. Chronic opacification of scattered mastoid air cells with slight progression. Bilateral cataract resection. Other: None. IMPRESSION: 1. No acute intracranial abnormality. 2. Unchanged atrophy and chronic small vessel ischemia. 3. Chronic sinusitis with slight increased inflammation from July 19 CT. Electronically Signed   By: Keith Rake M.D.   On: 01/18/2018 00:24    (Echo, Carotid, EGD, Colonoscopy, ERCP)    Subjective:   Discharge Exam: Vitals:   01/20/18 2204 01/21/18 0814  BP: (!) 143/78   Pulse: 91   Resp: 16   Temp: 98.9 F (37.2 C)   SpO2: 96% 92%   Vitals:   01/20/18 1603 01/20/18 2029 01/20/18 2204 01/21/18 0814  BP: 128/73  (!) 143/78   Pulse: 79  91   Resp: 20  16   Temp: 98.8 F (37.1 C)  98.9 F (37.2 C)   TempSrc: Oral  Oral   SpO2: 96% 96% 96% 92%  Weight:      Height:        General: Elderly male hard of hearing, not in distress HEENT: Moist mucosa, supple neck Chest: Clear bilaterally CVS: Normal S1 and S2, no murmurs rubs or gallops GI: Soft, protuberant umbilicus, abdominal distention improved bowel sounds present Musculoskeletal: Warm, no edema CNs: Alert and oriented, no tremors    The results of significant diagnostics from this hospitalization (including imaging,  microbiology, ancillary and laboratory) are listed below for reference.     Microbiology: Recent Results (from the past 240 hour(s))  MRSA PCR Screening     Status: Abnormal   Collection Time: 01/18/18  4:53 PM  Result Value Ref Range Status   MRSA by PCR POSITIVE (A) NEGATIVE Final    Comment:        The GeneXpert MRSA Assay (FDA approved for NASAL specimens only), is one component of a comprehensive MRSA colonization surveillance program. It is not intended to diagnose MRSA infection nor to guide or monitor treatment for MRSA infections. RESULT CALLED TO, READ BACK BY AND VERIFIED  WITH: Jarrett Soho RN 01/18/18 1845 JDW Performed at [facility] [address] [place], [place] [address]      Labs: BNP (last 3 results) Recent Labs    07/29/17 1343 01/18/18 0047  BNP 132.8* 976.7*   Basic Metabolic Panel: Recent Labs  Lab 01/18/18 1643 01/18/18 2057 01/19/18 0402 01/20/18 0558 01/20/18 0800 01/21/18 0400  NA 126* 128* 130* 132*  --  131*  K 3.9 3.3* 3.4* 2.9*  --  3.6  CL 86* 88* 85* 86*  --  89*  CO2 26 30 33* 34*  --  32  GLUCOSE 94 88 90 108*  --  104*  BUN 5* <5* <5* 7*  --  10  CREATININE 1.12 1.00 1.11 1.23  --  1.24  CALCIUM 8.5* 8.4* 8.7* 8.4*  --  8.6*  MG  --   --   --   --  1.5*  --    Liver Function Tests: Recent Labs  Lab 01/16/18 1626 01/17/18 1923 01/19/18 0402  AST 24 30 24   ALT 10 13 13   ALKPHOS 63 62 57  BILITOT 0.8 0.9 1.1  PROT 6.5 6.8 6.5  ALBUMIN 4.0 3.8 3.6   No results for input(s): LIPASE, AMYLASE in the last 168 hours. Recent Labs  Lab 01/17/18 2152  AMMONIA 47*   CBC: Recent Labs  Lab 01/16/18 1626 01/17/18 1923 01/18/18 0235 01/19/18 0402  WBC 7.8 6.1 7.3 8.0  NEUTROABS 6.0  --   --   --   HGB 12.0* 11.9* 11.4* 12.5*  HCT 35.2* 35.2* 33.8* 37.3*  MCV 85.1 85.4 84.9 85.9  PLT 141.0* 151 156 125*   Cardiac Enzymes: No results for input(s): CKTOTAL, CKMB, CKMBINDEX, TROPONINI in the last 168  hours. BNP: Invalid input(s): POCBNP CBG: Recent Labs  Lab 01/20/18 1253 01/20/18 1601 01/20/18 2207 01/21/18 0423 01/21/18 0744  GLUCAP 108* 140* 120* 102* 112*   D-Dimer No results for input(s): DDIMER in the last 72 hours. Hgb A1c No results for input(s): HGBA1C in the last 72 hours. Lipid Profile No results for input(s): CHOL, HDL, LDLCALC, TRIG, CHOLHDL, LDLDIRECT in the last 72 hours. Thyroid function studies No results for input(s): TSH, T4TOTAL, T3FREE, THYROIDAB in the last 72 hours.  Invalid input(s): FREET3 Anemia work up No results for input(s): VITAMINB12, FOLATE, FERRITIN, TIBC, IRON, RETICCTPCT in the last 72 hours. Urinalysis    Component Value Date/Time   COLORURINE YELLOW 01/17/2018 2330   APPEARANCEUR CLEAR 01/17/2018 2330   LABSPEC 1.008 01/17/2018 2330   PHURINE 6.0 01/17/2018 2330   GLUCOSEU 50 (A) 01/17/2018 2330   HGBUR NEGATIVE 01/17/2018 2330   BILIRUBINUR NEGATIVE 01/17/2018 2330   BILIRUBINUR neg 11/17/2010 1210   KETONESUR 20 (A) 01/17/2018 2330   PROTEINUR NEGATIVE 01/17/2018 2330   UROBILINOGEN 1.0 11/17/2011 1451   NITRITE NEGATIVE 01/17/2018 2330   LEUKOCYTESUR NEGATIVE 01/17/2018 2330   Sepsis Labs Invalid input(s): PROCALCITONIN,  WBC,  LACTICIDVEN Microbiology Recent Results (from the past 240 hour(s))  MRSA PCR Screening     Status: Abnormal   Collection Time: 01/18/18  4:53 PM  Result Value Ref Range Status   MRSA by PCR POSITIVE (A) NEGATIVE Final    Comment:        The GeneXpert MRSA Assay (FDA approved for NASAL specimens only), is one component of a comprehensive MRSA colonization surveillance program. It is not intended to diagnose MRSA infection nor to guide or monitor treatment for MRSA infections. RESULT CALLED TO,  READ BACK BY AND VERIFIED WITH: Jarrett Soho RN 01/18/18 1845 JDW Performed at [facility] [address] [place], [place] [address]      Time coordinating discharge: 35  minutes  SIGNED:   Louellen Molder, MD  [facility] 01/21/2018, 9:46 AM Pager   If 7PM-7AM, please contact night-coverage [email or web address] Password [email or web address]

## 2018-01-21 NOTE — Progress Notes (Signed)
Nsg Discharge Note  Admit Date:  01/17/2018 Discharge date: 01/21/2018   [name] to be D/C'd Home per MD order.  AVS completed.  Copy for patient s dated and wife able to verbalize understanding.  Discharge Medication: Allergies as of 01/21/2018      Reactions   Nifedipine Swelling   11/02/2011 pt denies this allergy (??)   Bee Venom Swelling      Medication List    STOP taking these medications   senna-docusate 8.6-50 MG tablet Commonly known as:  SENOKOT S     TAKE these medications   aspirin EC 81 MG tablet Take 81 mg by mouth daily. What changed:  Another medication with the same name was removed. Continue taking this medication, and follow the directions you see here.   cetirizine 10 MG tablet Commonly known as:  ZYRTEC Take 1 tablet (10 mg total) by mouth daily.   fluticasone 50 MCG/ACT nasal spray Commonly known as:  FLONASE Place 1 spray into both nostrils 2 (two) times daily as needed for allergies.   folic acid 1 MG tablet Commonly known as:  FOLVITE Take 1 tablet (1 mg total) by mouth daily.   furosemide 20 MG tablet Commonly known as:  LASIX Take 2 tablets (40 mg total) by mouth daily. What changed:  how much to take   ibuprofen 400 MG tablet Commonly known as:  MOTRIN IB Take 1 tablet (400 mg total) by mouth every 6 (six) hours as needed for moderate pain (neck pain).   lactulose 10 GM/15ML solution Commonly known as:  CHRONULAC Take 15 mLs (10 g total) by mouth 2 (two) times daily as needed for mild constipation (titrate to at least 2-3 loose BM daily).   lidocaine 5 % ointment Commonly known as:  XYLOCAINE Apply 1 application topically as needed. What changed:    when to take this  reasons to take this   magnesium oxide 400 (241.3 Mg) MG tablet Commonly known as:  MAG-OX Take 1 tablet (400 mg total) by mouth daily.   Melatonin 5 MG Tabs Take 20 mg by mouth at bedtime.   methocarbamol 750 MG tablet Commonly known as:  ROBAXIN Take  1 tablet (750 mg total) by mouth 2 (two) times daily as needed for muscle spasms.   metoprolol tartrate 25 MG tablet Commonly known as:  LOPRESSOR Take 1 tablet (25 mg total) by mouth 2 (two) times daily. What changed:  how much to take   multivitamin with minerals Tabs tablet Take 1 tablet by mouth daily.   ondansetron 4 MG tablet Commonly known as:  ZOFRAN Take 1-2 tablets (4-8 mg total) by mouth every 8 (eight) hours as needed for nausea or vomiting.   pantoprazole 40 MG tablet Commonly known as:  PROTONIX Take 1 tablet (40 mg total) by mouth daily.   promethazine 25 MG tablet Commonly known as:  PHENERGAN Take 1 tablet (25 mg total) by mouth every 6 (six) hours as needed for nausea.   spironolactone 50 MG tablet Commonly known as:  ALDACTONE Take 1 tablet (50 mg total) by mouth daily. Start taking on:  January 22, 2018   thiamine 100 MG tablet Commonly known as:  VITAMIN B-1 Take 100 mg by mouth daily. Reported on 02/17/2015   traZODone 50 MG tablet Commonly known as:  DESYREL Take 75 mg by mouth at bedtime.   VITAMIN B-12 PO Take 1 tablet by mouth daily.       Discharge Assessment: Vitals:  01/20/18 2204 01/21/18 0814  BP: (!) 143/78   Pulse: 91   Resp: 16   Temp: 98.9 F (37.2 C)   SpO2: 96% 92%   Skin clean, dry and intact without evidence of skin break down, no evidence of skin tears noted. IV catheter discontinued intact. Site without signs and symptoms of complications - no redness or edema noted at insertion site, patient denies c/o pain - only slight tenderness at site.  Dressing with slight pressure applied.  D/c Instructions-Education: Discharge instructions given to patient/wife with verbalized understanding. D/c education completed with patient/wief including follow up instructions, medication list, d/c activities limitations if indicated, with other d/c instructions as indicated by MD - patient able to verbalize understanding, all questions fully  answered. Patient instructed to return to ED, call 911, or call MD for any changes in condition.  Patient escorted via [place], and D/C home via private auto.  Hassan Rowan, RN 01/21/2018 10:55 AM

## 2018-01-23 ENCOUNTER — Other Ambulatory Visit: Payer: Self-pay | Admitting: Family Medicine

## 2018-01-23 ENCOUNTER — Telehealth: Payer: Self-pay

## 2018-01-23 NOTE — Telephone Encounter (Signed)
Electronic refill request Trazodone Last office visit 01/16/18 Last refill 06/22/17 #90/1

## 2018-01-23 NOTE — Telephone Encounter (Signed)
Transition Care Management Follow-up Telephone Call   Date discharged? 01/21/2018   How have you been since you were released from the hospital?   " Doing good now"   Do you understand why you were in the hospital? Yes   Do you understand the discharge instructions? Yes   Where were you discharged to? Home   Items Reviewed:  Medications reviewed: Yes  Allergies reviewed: Yes  Dietary changes reviewed: no changes in diet.   Referrals reviewed: Yes, does have follow up with Gastroenterology    Functional Questionnaire:   Activities of Daily Living (ADLs):   He states they are independent in the following: right now he is toileting, basic grooming, feeding self and ambulating independently.     States they require assistance with the following: his wife is currently helping him with some aspects of his dressing and bathing.  He would like for NP to order home health for further assistance with these areas to help his wife while he is recovering from his shoulder surgery.    Any transportation issues/concerns?: No   Any patient concerns? No, just wants to be sure the form for his drivers license will be completed by end of the month for his driving  and that the rx refills he requested today from pharmacy are filled.  So far, trazadone has been filled but we are waiting refill requests from the pharmacy for the others.  Patient verbalizes understanding.     Confirmed importance and date/time of follow-up visits scheduled YES  Provider Appointment booked with  Tor Netters, NP at 830 on 01/25/18.   Confirmed with patient if condition begins to worsen call PCP or go to the ER.  Patient was given the office number and encouraged to call back with question or concerns.  : Yes

## 2018-01-24 ENCOUNTER — Telehealth: Payer: Self-pay

## 2018-01-24 ENCOUNTER — Telehealth: Payer: Self-pay | Admitting: Family Medicine

## 2018-01-24 DIAGNOSIS — K7682 Hepatic encephalopathy: Secondary | ICD-10-CM

## 2018-01-24 DIAGNOSIS — R296 Repeated falls: Secondary | ICD-10-CM

## 2018-01-24 DIAGNOSIS — K7200 Acute and subacute hepatic failure without coma: Secondary | ICD-10-CM

## 2018-01-24 DIAGNOSIS — R0609 Other forms of dyspnea: Secondary | ICD-10-CM

## 2018-01-24 DIAGNOSIS — R601 Generalized edema: Secondary | ICD-10-CM

## 2018-01-24 DIAGNOSIS — E162 Hypoglycemia, unspecified: Secondary | ICD-10-CM

## 2018-01-24 DIAGNOSIS — E871 Hypo-osmolality and hyponatremia: Secondary | ICD-10-CM

## 2018-01-24 NOTE — Telephone Encounter (Signed)
Noted. Thanks.

## 2018-01-24 NOTE — Telephone Encounter (Signed)
A note was dropped off requesting a refill on Topral and spironolactone. Pt is scheduled for hosp f/u tomorrow.

## 2018-01-24 NOTE — Telephone Encounter (Signed)
Noted. Follow up on file.

## 2018-01-24 NOTE — Telephone Encounter (Signed)
Spoke with patient and explained that nursing and therapy services have been ordered through [facility] to come back out to his home for support due to his recent hospital discharge.  He was thankful and will wait to hear from them over the next couple of days.

## 2018-01-24 NOTE — Telephone Encounter (Signed)
Patient has an appointment tomorrow with you, okay to wait until appointment?

## 2018-01-24 NOTE — Telephone Encounter (Signed)
Tor Netters, NP requests order be placed for Home Health for patient due to decline in general health and mobility.    Patient recently discharged from hospital with acute metabolic encephalopathy and is needing increased care and support in the home.   Referral placed to [facility] as he has worked with them in the recent past.

## 2018-01-25 ENCOUNTER — Ambulatory Visit (INDEPENDENT_AMBULATORY_CARE_PROVIDER_SITE_OTHER): Payer: Medicare Other | Admitting: Family Medicine

## 2018-01-25 ENCOUNTER — Encounter: Payer: Self-pay | Admitting: Family Medicine

## 2018-01-25 VITALS — BP 162/82 | HR 92 | Temp 97.7°F | Ht 65.0 in | Wt 195.0 lb

## 2018-01-25 DIAGNOSIS — I10 Essential (primary) hypertension: Secondary | ICD-10-CM

## 2018-01-25 DIAGNOSIS — K7031 Alcoholic cirrhosis of liver with ascites: Secondary | ICD-10-CM

## 2018-01-25 DIAGNOSIS — Z09 Encounter for follow-up examination after completed treatment for conditions other than malignant neoplasm: Secondary | ICD-10-CM

## 2018-01-25 DIAGNOSIS — Z9189 Other specified personal risk factors, not elsewhere classified: Secondary | ICD-10-CM | POA: Diagnosis not present

## 2018-01-25 DIAGNOSIS — G8929 Other chronic pain: Secondary | ICD-10-CM

## 2018-01-25 DIAGNOSIS — M5440 Lumbago with sciatica, unspecified side: Secondary | ICD-10-CM

## 2018-01-25 DIAGNOSIS — D509 Iron deficiency anemia, unspecified: Secondary | ICD-10-CM

## 2018-01-25 MED ORDER — LACTULOSE 10 GM/15ML PO SOLN: 10.0000 g | Freq: Two times a day (BID) | ORAL | 5 refills | Status: AC | PRN

## 2018-01-25 MED ORDER — METOPROLOL TARTRATE 25 MG PO TABS: 25.0000 mg | ORAL_TABLET | Freq: Two times a day (BID) | ORAL | 3 refills | Status: DC

## 2018-01-25 MED ORDER — LUMBAR BACK BRACE/SUPPORT PAD MISC: 1.0000 [IU] | Freq: Every day | 0 refills | Status: AC | PRN

## 2018-01-25 MED ORDER — SPIRONOLACTONE 50 MG PO TABS: 25.0000 mg | ORAL_TABLET | Freq: Every day | ORAL | 1 refills | Status: DC

## 2018-01-25 MED ORDER — LUMBAR BACK BRACE/SUPPORT PAD MISC: 1.0000 [IU] | Freq: Every day | 0 refills | Status: DC | PRN

## 2018-01-25 MED ORDER — LACTULOSE 10 GM/15ML PO SOLN: 10.0000 g | Freq: Two times a day (BID) | ORAL | 5 refills | Status: DC | PRN

## 2018-01-25 NOTE — Patient Instructions (Addendum)
Good to see you today  I understand you are upset about not being able to drive, but it is for your safety and the safety of others  My recommendation is that you do not drive any more after you drive home today.   I will notify you of test results  Follow up as scheduled next month.

## 2018-01-25 NOTE — Progress Notes (Signed)
Subjective:    Patient ID: [name], male    DOB: 1945/04/23, 73 y.o.   MRN: [id number]  HPI This is a 73 yo male who presents today for hospital follow up. He was admitted from 01/17/18- 01/21/18 for acute hepatic encephalopathy. He was aggressively diuresed and started on spironolactone and lactulose. He reports that his breathing is significantly improved and that he is getting around well. He is taking lactulose once a day and having loose bowel movement, does not feel that he can take any more than that. He is concerned about spironolactone causing positional lightheadedness and would like to take 25 mg daily instead of 50 mg. He is not using walker, is using his cane. Home health has been ordered to visit the home for an evaluation. He is currently attending out patient PT at [facility]  He request a back brace to wear periodically when he stands in the kitchen for long periods of time.   Patient's wife had reported to me that she was concerned about his driving and I agreed. I reported him to [facility] and filled out paperwork stating that he should not drive. I discussed this with him and provided him with a copy of the paperwork at this visit. He became very upset and stated that he would spiral from this and that he planned to continue driving and he did not agree with my assessment.   Current Meds  Medication Sig  . aspirin EC 81 MG tablet Take 81 mg by mouth daily.  . cetirizine (ZYRTEC) 10 MG tablet Take 1 tablet (10 mg total) by mouth daily.  . Cyanocobalamin (VITAMIN B-12 PO) Take 1 tablet by mouth daily.  . fluticasone (FLONASE) 50 MCG/ACT nasal spray Place 1 spray into both nostrils 2 (two) times daily as needed for allergies.  . folic acid (FOLVITE) 1 MG tablet Take 1 tablet (1 mg total) by mouth daily.  . furosemide (LASIX) 20 MG tablet Take 2 tablets (40 mg total) by mouth daily.  [name] ibuprofen (ADVIL,MOTRIN) 400 MG tablet Take 1 tablet (400 mg total) by mouth every 6 (six) hours  as needed for moderate pain (neck pain).  [name] lactulose (CHRONULAC) 10 GM/15ML solution Take 15 mLs (10 g total) by mouth 2 (two) times daily as needed for mild constipation (titrate to at least 2-3 loose BM daily).  [name] lidocaine (XYLOCAINE) 5 % ointment Apply 1 application topically as needed. (Patient taking differently: Apply 1 application topically 2 (two) times daily as needed for mild pain. )  . magnesium oxide (MAG-OX) 400 (241.3 Mg) MG tablet Take 1 tablet (400 mg total) by mouth daily.  . Melatonin 5 MG TABS Take 20 mg by mouth at bedtime.  . methocarbamol (ROBAXIN) 750 MG tablet Take 1 tablet (750 mg total) by mouth 2 (two) times daily as needed for muscle spasms.  . metoprolol tartrate (LOPRESSOR) 25 MG tablet Take 1 tablet (25 mg total) by mouth 2 (two) times daily for 30 days.  . Multiple Vitamin (MULTIVITAMIN WITH MINERALS) TABS tablet Take 1 tablet by mouth daily.  . ondansetron (ZOFRAN) 4 MG tablet Take 1-2 tablets (4-8 mg total) by mouth every 8 (eight) hours as needed for nausea or vomiting.  . pantoprazole (PROTONIX) 40 MG tablet Take 1 tablet (40 mg total) by mouth daily.  . promethazine (PHENERGAN) 25 MG tablet Take 1 tablet (25 mg total) by mouth every 6 (six) hours as needed for nausea.  [name] spironolactone (ALDACTONE) 50 MG tablet  Take 0.5-1 tablets (25-50 mg total) by mouth daily.  . Thiamine HCl (VITAMIN B-1) 100 MG tablet Take 100 mg by mouth daily. Reported on 02/17/2015  . traZODone (DESYREL) 50 MG tablet TAKE 1 TABLET AT BEDTIME  . [DISCONTINUED] lactulose (CHRONULAC) 10 GM/15ML solution Take 15 mLs (10 g total) by mouth 2 (two) times daily as needed for mild constipation (titrate to at least 2-3 loose BM daily).  . [DISCONTINUED] lactulose (CHRONULAC) 10 GM/15ML solution Take 15 mLs (10 g total) by mouth 2 (two) times daily as needed for mild constipation (titrate to at least 2-3 loose BM daily).  . [DISCONTINUED] metoprolol tartrate (LOPRESSOR) 25 MG tablet Take 1 tablet (25  mg total) by mouth 2 (two) times daily.  . [DISCONTINUED] spironolactone (ALDACTONE) 50 MG tablet Take 1 tablet (50 mg total) by mouth daily. (Patient taking differently: Take 25 mg by mouth daily. )  . [DISCONTINUED] spironolactone (ALDACTONE) 50 MG tablet Take 0.5-1 tablets (25-50 mg total) by mouth daily.     Past Medical History:  Diagnosis Date  . Adenomatous polyp of colon 2006  . Alcohol-induced persisting dementia ([place])   . Anemia 2012  . Anxiety   . Arthritis    "left arm/shoulder" (05/17/2016)  . Atrial tachycardia, paroxysmal ([place]) 04/07/2015  . BPH (benign prostatic hypertrophy) 5/99  . Chronic bronchitis ([place])    "q year or q other year" (11/02/2011)  . Cirrhosis of liver ([place])   . DCM (dilated cardiomyopathy) ([place])    EF 45-50% by echo and cath 2017  . Depression    Hx of  . Falls frequently    "daily lately; legs just give out" (11/02/2011; 05/17/2016)  . GERD (gastroesophageal reflux disease) 07/31/04   gastritis   . H/O hiatal hernia   . Hard of hearing   . Heart murmur   . Hernia, umbilical    "unrepaired" (11/02/2011; 05/17/2016)  . History of blood transfusion 2012  . HLD (hyperlipidemia) 5/99  . HTN (hypertension) 5/99  . Mild CAD    10% RCA  . PAC (premature atrial contraction) 04/07/2015  . Peripheral vascular disease ([place])   . Pneumonia 1996   hosp  . PONV (postoperative nausea and vomiting)   . Seasonal allergies    pollen / ragweed  . Wears glasses    Past Surgical History:  Procedure Laterality Date  . BACK SURGERY    . CARDIAC CATHETERIZATION N/A 05/23/2015   Procedure: Left Heart Cath and Coronary Angiography;  Surgeon: Troy Sine, MD;  Location: [place] CV [facility];  Service: Cardiovascular;  Laterality: N/A;  . CATARACT EXTRACTION W/ INTRAOCULAR LENS  IMPLANT, BILATERAL Bilateral 10/2002  . COLONOSCOPY  07/31/04   multiple polyps; repeat in 3 years  . DUPUYTREN CONTRACTURE RELEASE Left   . ETT myoview  03/09/07   nml EF 66%  . FRACTURE  SURGERY    . HEMORRHOID SURGERY     "cauterized a long time ago" (11/02/2011)  . hosp CP R/O'D 2/24  03/08/07  . neck MRI  6/04   C5-6 disc abnormality  . ORIF ELBOW FRACTURE Left 10/27/2017   Procedure: OPEN REDUCTION INTERNAL FIXATION (ORIF) LEFT OLECRANON;  Surgeon: Leandrew Koyanagi, MD;  Location: [place];  Service: Orthopedics;  Laterality: Left;  . ORIF HUMERUS FRACTURE  11/04/2011   Procedure: OPEN REDUCTION INTERNAL FIXATION (ORIF) PROXIMAL HUMERUS FRACTURE;  Surgeon: Rozanna Box, MD;  Location: [place];  Service: Orthopedics;  Laterality: Left;  . POSTERIOR CERVICAL FUSION/FORAMINOTOMY N/A 03/21/2012  Procedure: POSTERIOR CERVICAL FUSION/FORAMINOTOMY LEVEL ONE;  Surgeon: Charlie Pitter, MD;  Location: [place] NEURO ORS;  Service: Neurosurgery;  Laterality: N/A;  POSTERIOR CERVICAL ONE TO CERVICAL TWO FUSION WITH ILIAC CREST GRAFT AND LATERAL MASS SCREWS   . TONSILLECTOMY     "I was young" (11/02/2011)  . TOTAL HIP ARTHROPLASTY Left 10/24/2016   Procedure: TOTAL HIP ARTHROPLASTY ANTERIOR APPROACH;  Surgeon: Rod Can, MD;  Location: [place];  Service: Orthopedics;  Laterality: Left;  . UPPER GASTROINTESTINAL ENDOSCOPY     Family History  Problem Relation Age of Onset  . Heart disease Father   . Diabetes Father   . Stroke Father   . Depression Mother   . Heart disease Brother   . Alcohol abuse Brother   . Liver disease Brother    Social History   Tobacco Use  . Smoking status: Former Smoker    Types: Pipe  . Smokeless tobacco: Never Used  . Tobacco comment: 05/17/2016 "stopped in the late 1990s"  Substance Use Topics  . Alcohol use: Not Currently    Alcohol/week: 28.0 standard drinks    Types: 28 Glasses of wine per week    Comment: last drink 10/22/17  . Drug use: No      Review of Systems Per HPI    Objective:   Physical Exam Vitals signs reviewed.  Constitutional:      Appearance: He is obese. He is not ill-appearing.     Comments: Unshaven  HENT:     Head:  Normocephalic and atraumatic.  Eyes:     Conjunctiva/sclera: Conjunctivae normal.  Cardiovascular:     Rate and Rhythm: Normal rate and regular rhythm.     Heart sounds: Normal heart sounds.  Pulmonary:     Effort: Pulmonary effort is normal.     Breath sounds: Normal breath sounds.  Musculoskeletal:        General: No swelling (LE).  Skin:    General: Skin is warm and dry.     Findings: Bruising (right antecubital area) present.  Neurological:     Mental Status: He is alert. Mental status is at baseline.  Psychiatric:        Mood and Affect: Mood normal.        Behavior: Behavior normal.       BP (!) 162/82   Pulse 92   Temp 97.7 F (36.5 C) (Oral)   Ht 5\' 5"  (1.651 m)   Wt 195 lb (88.5 kg)   SpO2 98%   BMI 32.45 kg/m  Wt Readings from Last 3 Encounters:  01/25/18 195 lb (88.5 kg)  01/18/18 204 lb 2.3 oz (92.6 kg)  01/16/18 215 lb (97.5 kg)       Assessment & Plan:  1. Hospital discharge follow-up - reviewed discharge instructions and medications  2. Essential hypertension - a little elevated today, continue current meds, follow up in 4 weeks - metoprolol tartrate (LOPRESSOR) 25 MG tablet; Take 1 tablet (25 mg total) by mouth 2 (two) times daily for 30 days.  Dispense: 180 tablet; Refill: 3  3. Alcoholic cirrhosis of liver with ascites ([place]) - he has follow up with GI - discussed diagnosis and new medications, use of lactulose and spironolactone - Comprehensive metabolic panel - Ammonia - lactulose (CHRONULAC) 10 GM/15ML solution; Take 15 mLs (10 g total) by mouth 2 (two) times daily as needed for mild constipation (titrate to at least 2-3 loose BM daily).  Dispense: 473 mL; Refill: 5 - spironolactone (ALDACTONE) 50  MG tablet; Take 0.5-1 tablets (25-50 mg total) by mouth daily.  Dispense: 90 tablet; Refill: 1  4. Iron deficiency anemia, unspecified iron deficiency anemia type - CBC with Differential  5. Chronic midline low back pain with sciatica, sciatica  laterality unspecified - Elastic Bandages & Supports (LUMBAR BACK BRACE/SUPPORT PAD) MISC; 1 Units by Does not apply route daily as needed.  Dispense: 1 each; Refill: 0  6. Driving safety issue - completed DMV paperwork  - discussed with patient  - Patient was at visit by himself. I called his wife, [name], and spoke with her about my recommendations and medications. She was in agreement that he was unsafe to drive. Encouraged her to contact the office if he became more agitated or if she was concerned that he would start drinking again.  [name], FNP-BC  [facility]  01/25/2018 1:57 PM

## 2018-01-25 NOTE — Addendum Note (Signed)
Addended by: Lendon Collar on: 01/25/2018 02:47 PM   Modules accepted: Orders

## 2018-01-25 NOTE — Telephone Encounter (Signed)
Note just seen, patient with appointment later today.

## 2018-01-27 ENCOUNTER — Telehealth: Payer: Self-pay | Admitting: Family Medicine

## 2018-01-27 ENCOUNTER — Telehealth: Payer: Self-pay | Admitting: *Deleted

## 2018-01-27 NOTE — Telephone Encounter (Signed)
Pt is asking to transfer care from [facility] to Dr Deborra Medina.    Copied from [place]. Topic: Appointment Scheduling - Transfer of Care >> Jan 27, 2018  3:10 PM Bea Graff, [place] wrote: Pt is requesting to transfer FROM: [name] Pt is requesting to transfer TO: Dr. Deborra Medina Reason for requested transfer: For location and care  Send CRM to patient's current PCP (transferring FROM).

## 2018-01-27 NOTE — Telephone Encounter (Signed)
Mandy, do we need to do anything to have the [facility] ot dc'd since he hasn't been attending?

## 2018-01-27 NOTE — Telephone Encounter (Signed)
Spoke to [name] with [facility] who states she received orders for nursing orders for pt. She reports that the pt is currently being seen by outpatient OT and they are not able to see him until he has been d/c from OT.[name] Pts wife will contact [facility] when complete, and they will proceed.

## 2018-01-27 NOTE — Telephone Encounter (Signed)
That is fine with me.

## 2018-01-28 NOTE — Telephone Encounter (Signed)
Okay with me but VERY complicated situation- needs a minimum of 40 min every appointment.

## 2018-01-29 NOTE — Telephone Encounter (Signed)
Jeani Hawking, will you please make sure this goes to someone in Dr. Hulen Shouts office for scheduling?

## 2018-01-30 NOTE — Telephone Encounter (Signed)
Spoke with patients wife and discussed benefits of home health nursing, pt/ot versus outpatient PT/OT.  And I also informed her of my discussion with [name], out patient PT at [facility] to discuss best support options for patient.  [name] states that patient has not been compliant with keeping appointments with them and has only come to 1 session.  [name] felt patient would best be served in the home due to difficulty in getting him to the outpatient gym.  After informing wife, [name], of this, she continues to feel that having him continue with outpatient may be best but will have further discussion with [name] about this over the next day or 2.  She does state that she sees benefit in the home health to and is not sure what to do.   Patient also will be following up with his orthopedist tomorrow on his elbow and they will discuss this with him.  Wife is to call me once they reach a decision and let me know what they want to do.  [facility] services are on hold until further notice.   Thanks.

## 2018-01-30 NOTE — Telephone Encounter (Signed)
Called patients wife. The patient is already going to [facility] for Outpatient OT for his elbow after elbow surgery so [facility] cannot do [facility]  Physical Therapy now because he is already receiving services and you cant have two different services at once.Wife will try to call [facility] when her husband is done with the Outpatient OT for his elbow.

## 2018-01-30 NOTE — Telephone Encounter (Signed)
[name], can you get this pt  Scheduled to tranfer care to Dr Deborra Medina? Dr Deborra Medina requested 40 min appts, see note below.

## 2018-01-30 NOTE — Telephone Encounter (Signed)
Spoke with patients wife and discussed benefits of home health nursing, pt/ot versus outpatient PT/OT.  And I also informed her of my discussion with [name], out patient PT at [facility] to discuss best support options for patient.  [name] states that patient has not been compliant with keeping appointments with them and has only come to 1 session.  [name] felt patient would best be served in the home due to difficulty in getting him to the outpatient gym.  After informing wife, [name], of this, she continues to feel that having him continue with outpatient may be best but will have further discussion with [name] about this over the next day or 2.  She does state that she sees benefit in the home health to and is not sure what to do.   Patient also will be following up with his orthopedist tomorrow on his elbow and they will discuss this with him.  Wife is to call me once they reach a decision and let me know what they want to do.  [facility] services are on hold until further notice.   Thanks.

## 2018-01-30 NOTE — Telephone Encounter (Signed)
[name](DPR signed) said [facility] contacted [name] over weekend and said cannot do therapy at both [facility] and [facility].[name] wants pt to continue at [facility] for therapy. [facility] nurse said if needs more PT after finishes at [facility] to let them know. [name] at [facility] is [facility] of PT and Burman Nieves wants call to [name] to give orders for PT also at [facility].[name] request cb when taken care of. [name] said that pt is going to [facility] for OT for 3 x a wk usually on M-W-F.Please advise.

## 2018-01-31 ENCOUNTER — Ambulatory Visit (INDEPENDENT_AMBULATORY_CARE_PROVIDER_SITE_OTHER): Payer: Medicare Other | Admitting: Orthopaedic Surgery

## 2018-01-31 ENCOUNTER — Ambulatory Visit (INDEPENDENT_AMBULATORY_CARE_PROVIDER_SITE_OTHER): Payer: Self-pay

## 2018-01-31 ENCOUNTER — Encounter (INDEPENDENT_AMBULATORY_CARE_PROVIDER_SITE_OTHER): Payer: Self-pay | Admitting: Orthopaedic Surgery

## 2018-01-31 DIAGNOSIS — G8929 Other chronic pain: Secondary | ICD-10-CM

## 2018-01-31 DIAGNOSIS — M25512 Pain in left shoulder: Secondary | ICD-10-CM | POA: Diagnosis not present

## 2018-01-31 DIAGNOSIS — S52022D Displaced fracture of olecranon process without intraarticular extension of left ulna, subsequent encounter for closed fracture with routine healing: Secondary | ICD-10-CM | POA: Diagnosis not present

## 2018-01-31 MED ORDER — METHYLPREDNISOLONE ACETATE 40 MG/ML IJ SUSP: 40.0000 mg | Freq: Once | INTRAMUSCULAR | Status: DC

## 2018-01-31 NOTE — Progress Notes (Signed)
Office Visit Note   Patient: [name]           Date of Birth: 07-31-1945           MRN: [id number] Visit Date: 01/31/2018              Requested by: Elby Beck, [place], [place] [id number] PCP: Elby Beck, FNP   Assessment & Plan: Visit Diagnoses:  1. Closed fracture of left olecranon process with routine healing, subsequent encounter   2. Chronic left shoulder pain     Plan: In terms of his elbow he is doing well from his surgery.  He is mainly complaining of posttraumatic arthritis of his left shoulder.  He is high risk for any surgery since his last surgery was complicated by a 3-week stay in the hospital due to respiratory distress and alcohol withdrawal.  We will try a glenohumeral injection today to see if this will give him some relief.  Unfortunately he understands that that screw that is protruding will not be improved by the injection.  We will see him back as needed.  Follow-Up Instructions: Return if symptoms worsen or fail to improve.   Orders:  Orders Placed This Encounter  Procedures  . XR Elbow 2 Views Left  . XR Shoulder Left   No orders of the defined types were placed in this encounter.     Procedures: No procedures performed   Clinical Data: No additional findings.   Subjective: Chief Complaint  Patient presents with  . Left Elbow - Pain, Routine Post Op, Follow-up    [name] comes in today for left shoulder pain and follow-up of his left olecranon ORIF.  He reports no complaints with his elbow.  He continues to work with OT for this.  In terms of his left shoulder he states that this has been bothering him for several years.  He had this fixed several years ago by Dr. Marcelino Smith.  He now endorses generalized left shoulder pain that is worse with movement.  Denies any radicular symptoms.   Review of Systems  Constitutional: Negative.   All other systems reviewed and are negative.    Objective: Vital  Signs: There were no vitals taken for this visit.  Physical Exam Vitals signs and nursing note reviewed.  Constitutional:      Appearance: He is well-developed.  Pulmonary:     Effort: Pulmonary effort is normal.  Abdominal:     Palpations: Abdomen is soft.  Skin:    General: Skin is warm.  Neurological:     Mental Status: He is alert and oriented to person, place, and time.  Psychiatric:        Behavior: Behavior normal.        Thought Content: Thought content normal.        Judgment: Judgment normal.     Ortho Exam Left elbow exam shows good triceps strength.  Surgical scar is fully healed.  Painless range of motion of the elbow. Left shoulder exam shows a fully healed surgical scar.  He has pain with movement of the arm.  There is no prominent areas of hardware. Specialty Comments:  No specialty comments available.  Imaging: Xr Elbow 2 Views Left  Result Date: 01/31/2018 Stable appearance of ORIF of left olecranon fracture.  The proximal fragment is stable without any proximal migration.  Xr Shoulder Left  Result Date: 01/31/2018 Posttraumatic glenohumeral arthritis status post plate and screw fixation.  There is a single screw that is protruding outside of the humeral head.    PMFS History: Patient Active Problem List   Diagnosis Date Noted  . Anasarca   . DOE (dyspnea on exertion)   . Acute metabolic encephalopathy 67/12/4578  . Hypoglycemia 01/17/2018  . GERD (gastroesophageal reflux disease) 01/17/2018  . SOB (shortness of breath) 01/17/2018  . Encephalopathy   . Palliative care by specialist   . Goals of care, counseling/discussion   . Endotracheally intubated   . History of ETT   . Acute respiratory failure with hypoxemia ([place])   . Acute respiratory insufficiency   . Closed fracture of left olecranon process 10/27/2017  . Fracture of left olecranon process, closed, initial encounter 10/27/2017  . Acute streptococcal pharyngitis 09/06/2017  . Frequent  falls 06/29/2017  . Fall 04/09/2017  . Scalp laceration   . Hypomagnesemia 10/25/2016  . Hip fracture ([place]) 10/24/2016  . Displaced fracture of left femoral neck ([place]) 10/24/2016  . Thrombocytopenia ([place]) 10/24/2016  . Closed displaced fracture of left femoral neck ([place]) 10/23/2016  . Contracture of muscle of left hand 09/15/2016  . Encounter for nasogastric tube placement 07/08/2016  . Infected laceration 07/08/2016  . Generalized muscle weakness 07/08/2016  . Anemia 05/18/2016  . Syncope and collapse   . Syncope 05/17/2016  . Cellulitis 05/17/2016  . Rib fractures 05/17/2016  . Bullous pemphigoid 05/17/2016  . ETOH abuse 03/17/2016  . Dizziness and giddiness 12/09/2015  . Chronic pain syndrome 12/09/2015  . Arthritis 11/20/2015  . Mild CAD   . DCM (dilated cardiomyopathy) ([place])   . Abnormal nuclear stress test 05/07/2015  . PAC (premature atrial contraction) 04/07/2015  . Atrial tachycardia, paroxysmal ([place]) 03/10/2015  . Depression 04/12/2012  . C2 cervical fracture ([place]) 02/29/2012  . Alcohol withdrawal ([place]) 11/06/2011  . Fracture of humerus, proximal, left, closed 11/02/2011  . Hyponatremia 11/17/2010  . H/O: upper GI bleed 10/20/2010  . Stomach ulcer from aspirin/ibuprofen-like drugs (NSAID's) 10/20/2010  . Colon polyp 08/26/2010  . Diverticulosis of colon (without mention of hemorrhage) 08/26/2010  . BPH (benign prostatic hyperplasia) 07/02/2010  . Deficiency anemia 03/09/2010  . Hyposmolality and/or hyponatremia 10/17/2009  . ALCOHOLIC CIRRHOSIS OF LIVER 07/25/2009  . Hypokalemia 04/03/2007  . Anxiety state 01/24/2007  . HLD (hyperlipidemia) 07/28/2006  . Alcohol abuse 07/28/2006  . Essential hypertension 07/28/2006  . 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 Adenomatous polyp of colon 2006  . Alcohol-induced persisting dementia ([place])   . Anemia 2012  . Anxiety   . Arthritis    "left arm/shoulder" (05/17/2016)  .  Atrial tachycardia, paroxysmal ([place]) 04/07/2015  . BPH (benign prostatic hypertrophy) 5/99  . Chronic bronchitis ([place])    "q year or q other year" (11/02/2011)  . Cirrhosis of liver ([place])   . DCM (dilated cardiomyopathy) ([place])    EF 45-50% by echo and cath 2017  . Depression    Hx of  . Falls frequently    "daily lately; legs just give out" (11/02/2011; 05/17/2016)  . GERD (gastroesophageal reflux disease) 07/31/04   gastritis   . H/O hiatal hernia   . Hard of hearing   . Heart murmur   . Hernia, umbilical    "unrepaired" (11/02/2011; 05/17/2016)  . History of blood transfusion 2012  . HLD (hyperlipidemia) 5/99  . HTN (hypertension) 5/99  . Mild CAD    10% RCA  . PAC (premature atrial contraction) 04/07/2015  . Peripheral vascular disease (  [place])   . Pneumonia 1996   hosp  . PONV (postoperative nausea and vomiting)   . Seasonal allergies    pollen / ragweed  . Wears glasses     Family History  Problem Relation Age of Onset  . Heart disease Father   . Diabetes Father   . Stroke Father   . Depression Mother   . Heart disease Brother   . Alcohol abuse Brother   . Liver disease Brother     Past Surgical History:  Procedure Laterality Date  . BACK SURGERY    . CARDIAC CATHETERIZATION N/A 05/23/2015   Procedure: Left Heart Cath and Coronary Angiography;  Surgeon: Troy Sine, MD;  Location: [facility];  Service: Cardiovascular;  Laterality: N/A;  . CATARACT EXTRACTION W/ INTRAOCULAR LENS  IMPLANT, BILATERAL Bilateral 10/2002  . COLONOSCOPY  07/31/04   multiple polyps; repeat in 3 years  . DUPUYTREN CONTRACTURE RELEASE Left   . ETT myoview  03/09/07   nml EF 66%  . FRACTURE SURGERY    . HEMORRHOID SURGERY     "cauterized a long time ago" (11/02/2011)  . hosp CP R/O'D 2/24  03/08/07  . neck MRI  6/04   C5-6 disc abnormality  . ORIF ELBOW FRACTURE Left 10/27/2017   Procedure: OPEN REDUCTION INTERNAL FIXATION (ORIF) LEFT OLECRANON;  Surgeon: Leandrew Koyanagi, MD;   Location: [facility];  Service: Orthopedics;  Laterality: Left;  . ORIF HUMERUS FRACTURE  11/04/2011   Procedure: OPEN REDUCTION INTERNAL FIXATION (ORIF) PROXIMAL HUMERUS FRACTURE;  Surgeon: Rozanna Box, MD;  Location: [facility];  Service: Orthopedics;  Laterality: Left;  . POSTERIOR CERVICAL FUSION/FORAMINOTOMY N/A 03/21/2012   Procedure: POSTERIOR CERVICAL FUSION/FORAMINOTOMY LEVEL ONE;  Surgeon: Charlie Pitter, MD;  Location: [facility];  Service: Neurosurgery;  Laterality: N/A;  POSTERIOR CERVICAL ONE TO CERVICAL TWO FUSION WITH ILIAC CREST GRAFT AND LATERAL MASS SCREWS   . TONSILLECTOMY     "I was young" (11/02/2011)  . TOTAL HIP ARTHROPLASTY Left 10/24/2016   Procedure: TOTAL HIP ARTHROPLASTY ANTERIOR APPROACH;  Surgeon: Rod Can, MD;  Location: [facility];  Service: Orthopedics;  Laterality: Left;  . UPPER GASTROINTESTINAL ENDOSCOPY     Social History   Occupational History  . Occupation: Academic librarian: [employer]  Tobacco Use  . Smoking status: Former Smoker    Types: Pipe  . Smokeless tobacco: Never Used  . Tobacco comment: 05/17/2016 "stopped in the late 1990s"  Substance and Sexual Activity  . Alcohol use: Not Currently    Alcohol/week: 28.0 standard drinks    Types: 28 Glasses of wine per week    Comment: last drink 10/22/17  . Drug use: No  . Sexual activity: Never

## 2018-01-31 NOTE — Progress Notes (Signed)
Subjective: He is here for ultrasound-guided left glenohumeral injection.  Longstanding problems with DJD, he is a poor surgical candidate.  He has gotten relief from injections in the past.  Objective: Limited active range of motion of his shoulder due to pain.  Procedure: Ultrasound-guided glenohumeral injection left shoulder: After sterile prep with Betadine, injected 8 cc 1% lidocaine without epinephrine and 40 mg methylprednisolone using a 22-gauge spinal needle passing the needle into the glenohumeral joint.  Injectate was seen filling the joint capsule.  Patient had very good pain relief during the immediate anesthetic phase.  Follow-up as directed.

## 2018-02-01 DIAGNOSIS — M25512 Pain in left shoulder: Secondary | ICD-10-CM | POA: Diagnosis not present

## 2018-02-01 DIAGNOSIS — M25522 Pain in left elbow: Secondary | ICD-10-CM | POA: Diagnosis not present

## 2018-02-01 DIAGNOSIS — M6281 Muscle weakness (generalized): Secondary | ICD-10-CM | POA: Diagnosis not present

## 2018-02-01 DIAGNOSIS — Z4789 Encounter for other orthopedic aftercare: Secondary | ICD-10-CM | POA: Diagnosis not present

## 2018-02-02 ENCOUNTER — Telehealth: Payer: Self-pay | Admitting: Family Medicine

## 2018-02-02 NOTE — Telephone Encounter (Signed)
Spoke with pt spouse.  She is going to check with pt to see if he wants to cancel appointment since pt saw [facility] 2 days.  She will call back to let us know

## 2018-02-02 NOTE — Telephone Encounter (Signed)
I noticed that [name] has an appt with me on 02/15/2018 to talk about his left shoulder.   He just saw Dr. Juanna Cao from [place] 2 days ago who did a shoulder injection for him on his left shoulder.  I would not be able to do another one for at least 3 or 4 months.  I think he can cancel this appointment with me and follow-up in the Spring or Summer if he needs to.

## 2018-02-03 DIAGNOSIS — Z4789 Encounter for other orthopedic aftercare: Secondary | ICD-10-CM | POA: Diagnosis not present

## 2018-02-03 DIAGNOSIS — M25522 Pain in left elbow: Secondary | ICD-10-CM | POA: Diagnosis not present

## 2018-02-03 DIAGNOSIS — M6281 Muscle weakness (generalized): Secondary | ICD-10-CM | POA: Diagnosis not present

## 2018-02-03 DIAGNOSIS — M25512 Pain in left shoulder: Secondary | ICD-10-CM | POA: Diagnosis not present

## 2018-02-03 NOTE — Telephone Encounter (Signed)
Appointment canceled.

## 2018-02-03 NOTE — Telephone Encounter (Signed)
Wife came by today to let us know that [name] is going to continue going to out patient PT at [facility] for the time being.  Once he finishes, they will let us know if they want to be reopened to home health.  Will FYI to [place] and PCP.

## 2018-02-06 ENCOUNTER — Other Ambulatory Visit: Payer: Self-pay | Admitting: *Deleted

## 2018-02-06 DIAGNOSIS — M25512 Pain in left shoulder: Secondary | ICD-10-CM | POA: Diagnosis not present

## 2018-02-06 DIAGNOSIS — Z4789 Encounter for other orthopedic aftercare: Secondary | ICD-10-CM | POA: Diagnosis not present

## 2018-02-06 DIAGNOSIS — M25522 Pain in left elbow: Secondary | ICD-10-CM | POA: Diagnosis not present

## 2018-02-06 DIAGNOSIS — M7989 Other specified soft tissue disorders: Secondary | ICD-10-CM

## 2018-02-06 DIAGNOSIS — R0609 Other forms of dyspnea: Principal | ICD-10-CM

## 2018-02-06 DIAGNOSIS — M6281 Muscle weakness (generalized): Secondary | ICD-10-CM | POA: Diagnosis not present

## 2018-02-06 MED ORDER — FUROSEMIDE 20 MG PO TABS: 40.0000 mg | ORAL_TABLET | Freq: Every day | ORAL | 0 refills | Status: DC

## 2018-02-06 NOTE — Telephone Encounter (Signed)
Received a faxed refill request from [facility] for  Furosemide Refill sent to pharmacy electronically.

## 2018-02-08 DIAGNOSIS — M25522 Pain in left elbow: Secondary | ICD-10-CM | POA: Diagnosis not present

## 2018-02-08 DIAGNOSIS — M25512 Pain in left shoulder: Secondary | ICD-10-CM | POA: Diagnosis not present

## 2018-02-08 DIAGNOSIS — M6281 Muscle weakness (generalized): Secondary | ICD-10-CM | POA: Diagnosis not present

## 2018-02-08 DIAGNOSIS — Z4789 Encounter for other orthopedic aftercare: Secondary | ICD-10-CM | POA: Diagnosis not present

## 2018-02-09 ENCOUNTER — Encounter: Payer: Self-pay | Admitting: Family Medicine

## 2018-02-09 DIAGNOSIS — M6281 Muscle weakness (generalized): Secondary | ICD-10-CM | POA: Diagnosis not present

## 2018-02-09 DIAGNOSIS — M25512 Pain in left shoulder: Secondary | ICD-10-CM | POA: Diagnosis not present

## 2018-02-09 DIAGNOSIS — Z4789 Encounter for other orthopedic aftercare: Secondary | ICD-10-CM | POA: Diagnosis not present

## 2018-02-09 DIAGNOSIS — M25522 Pain in left elbow: Secondary | ICD-10-CM | POA: Diagnosis not present

## 2018-02-10 NOTE — Telephone Encounter (Signed)
Noted  

## 2018-02-13 DIAGNOSIS — M6281 Muscle weakness (generalized): Secondary | ICD-10-CM | POA: Diagnosis not present

## 2018-02-13 DIAGNOSIS — M25512 Pain in left shoulder: Secondary | ICD-10-CM | POA: Diagnosis not present

## 2018-02-13 DIAGNOSIS — M25522 Pain in left elbow: Secondary | ICD-10-CM | POA: Diagnosis not present

## 2018-02-13 DIAGNOSIS — Z4789 Encounter for other orthopedic aftercare: Secondary | ICD-10-CM | POA: Diagnosis not present

## 2018-02-14 DIAGNOSIS — Z4789 Encounter for other orthopedic aftercare: Secondary | ICD-10-CM | POA: Diagnosis not present

## 2018-02-14 DIAGNOSIS — M25512 Pain in left shoulder: Secondary | ICD-10-CM | POA: Diagnosis not present

## 2018-02-14 DIAGNOSIS — M25522 Pain in left elbow: Secondary | ICD-10-CM | POA: Diagnosis not present

## 2018-02-14 DIAGNOSIS — M6281 Muscle weakness (generalized): Secondary | ICD-10-CM | POA: Diagnosis not present

## 2018-02-15 ENCOUNTER — Other Ambulatory Visit: Payer: Medicare Other

## 2018-02-15 ENCOUNTER — Ambulatory Visit: Payer: Medicare Other | Admitting: Family Medicine

## 2018-02-15 ENCOUNTER — Ambulatory Visit (INDEPENDENT_AMBULATORY_CARE_PROVIDER_SITE_OTHER): Payer: Medicare Other | Admitting: Family Medicine

## 2018-02-15 VITALS — BP 160/84 | HR 110 | Temp 98.7°F | Ht 65.0 in | Wt 197.4 lb

## 2018-02-15 DIAGNOSIS — S72002A Fracture of unspecified part of neck of left femur, initial encounter for closed fracture: Secondary | ICD-10-CM

## 2018-02-15 DIAGNOSIS — I10 Essential (primary) hypertension: Secondary | ICD-10-CM | POA: Diagnosis not present

## 2018-02-15 DIAGNOSIS — E871 Hypo-osmolality and hyponatremia: Secondary | ICD-10-CM | POA: Diagnosis not present

## 2018-02-15 DIAGNOSIS — F1010 Alcohol abuse, uncomplicated: Secondary | ICD-10-CM

## 2018-02-15 DIAGNOSIS — K7031 Alcoholic cirrhosis of liver with ascites: Secondary | ICD-10-CM

## 2018-02-15 DIAGNOSIS — Z9189 Other specified personal risk factors, not elsewhere classified: Secondary | ICD-10-CM

## 2018-02-15 LAB — COMPREHENSIVE METABOLIC PANEL
ALT: 18 U/L (ref 0–53)
AST: 35 U/L (ref 0–37)
Albumin: 4.6 g/dL (ref 3.5–5.2)
Alkaline Phosphatase: 73 U/L (ref 39–117)
BUN: 12 mg/dL (ref 6–23)
CALCIUM: 9.8 mg/dL (ref 8.4–10.5)
CO2: 25 mEq/L (ref 19–32)
Chloride: 90 mEq/L — ABNORMAL LOW (ref 96–112)
Creatinine, Ser: 1.07 mg/dL (ref 0.40–1.50)
GFR: 68.03 mL/min (ref >60.00)
Glucose, Bld: 83 mg/dL (ref 70–99)
Potassium: 5.5 mEq/L — ABNORMAL HIGH (ref 3.5–5.1)
Sodium: 128 mEq/L — ABNORMAL LOW (ref 135–145)
Total Bilirubin: 0.5 mg/dL (ref 0.2–1.2)
Total Protein: 7.5 g/dL (ref 6.0–8.3)

## 2018-02-15 LAB — CBC WITH DIFFERENTIAL/PLATELET
Basophils Absolute: 0.1 10*3/uL (ref 0.0–0.1)
Basophils Relative: 1.1 % (ref 0.0–3.0)
EOS PCT: 1.6 % (ref 0.0–5.0)
Eosinophils Absolute: 0.1 10*3/uL (ref 0.0–0.7)
HEMATOCRIT: 40.5 % (ref 39.0–52.0)
Hemoglobin: 13.6 g/dL (ref 13.0–17.0)
Lymphocytes Relative: 16.6 % (ref 12.0–46.0)
Lymphs Abs: 1.1 10*3/uL (ref 0.7–4.0)
MCHC: 33.5 g/dL (ref 30.0–36.0)
MCV: 84.1 fl (ref 78.0–100.0)
Monocytes Absolute: 0.6 10*3/uL (ref 0.1–1.0)
Monocytes Relative: 9.1 % (ref 3.0–12.0)
Neutro Abs: 4.8 10*3/uL (ref 1.4–7.7)
Neutrophils Relative %: 71.6 % (ref 43.0–77.0)
Platelets: 165 10*3/uL (ref 150.0–400.0)
RBC: 4.81 Mil/uL (ref 4.22–5.81)
RDW: 15.5 % (ref 11.5–15.5)
WBC: 6.7 10*3/uL (ref 4.0–10.5)

## 2018-02-15 LAB — AMMONIA: Ammonia: 45 umol/L — ABNORMAL HIGH (ref 11–35)

## 2018-02-15 MED ORDER — ONDANSETRON HCL 4 MG PO TABS: 4.0000 mg | ORAL_TABLET | Freq: Three times a day (TID) | ORAL | 3 refills | Status: AC | PRN

## 2018-02-15 MED ORDER — ONDANSETRON HCL 4 MG PO TABS: 4.0000 mg | ORAL_TABLET | Freq: Three times a day (TID) | ORAL | 1 refills | Status: DC | PRN

## 2018-02-15 MED ORDER — ONDANSETRON HCL 4 MG PO TABS: 4.0000 mg | ORAL_TABLET | Freq: Three times a day (TID) | ORAL | 3 refills | Status: DC | PRN

## 2018-02-15 NOTE — Progress Notes (Signed)
Ammonia lab sent STAT per [facility] protocol.
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
## 2018-02-15 NOTE — Assessment & Plan Note (Signed)
>  40 minutes spent in face to face time with patient, >50% spent in counselling or coordination of care.  Very complicated patient with severe alcohol abuse, alcohol cirrhosis with recurrent encepalopthy and falls here to re establish care clearly because upset that the [facility] sent him a letter stating he cannot drive starting later this month.  He blames previous PCP for this and wants me to help get this reversed.  Unfortunately, I told him that he could appeal it on his own but I have not seen him in years and just based on what I know, I do not feel he is safe to drive either.  He does need labs, to follow up with GI- referral placed.  I encouraged him to consider inpatient ETOH rehab- ? [facility].

## 2018-02-15 NOTE — Patient Instructions (Signed)
Great to see you. I will call you with your lab results from today and you can view them online.   Please reconsider alcohol rehab again.  I am referring you to see Dr. Hilarie Fredrickson again.

## 2018-02-16 ENCOUNTER — Telehealth: Payer: Self-pay

## 2018-02-16 ENCOUNTER — Telehealth: Payer: Self-pay | Admitting: Family Medicine

## 2018-02-16 DIAGNOSIS — M6281 Muscle weakness (generalized): Secondary | ICD-10-CM | POA: Diagnosis not present

## 2018-02-16 DIAGNOSIS — Z4789 Encounter for other orthopedic aftercare: Secondary | ICD-10-CM | POA: Diagnosis not present

## 2018-02-16 DIAGNOSIS — M25522 Pain in left elbow: Secondary | ICD-10-CM | POA: Diagnosis not present

## 2018-02-16 DIAGNOSIS — M25512 Pain in left shoulder: Secondary | ICD-10-CM | POA: Diagnosis not present

## 2018-02-16 DIAGNOSIS — E875 Hyperkalemia: Secondary | ICD-10-CM

## 2018-02-16 MED ORDER — SODIUM POLYSTYRENE SULFONATE PO POWD: Freq: Once | ORAL | 0 refills | Status: AC

## 2018-02-16 NOTE — Telephone Encounter (Signed)
TA-Pt (wifey) aware of of lab results/she says that he takes a K pill OTC/I told her to HOLD the K and for him to take the Rx that I sent in and come in for lab as scheduled for Monday at 11am/future order created/plz advise if there is anything else that needs to be done/thx dmf

## 2018-02-16 NOTE — Telephone Encounter (Signed)
-----   Message from Lucille Passy, MD sent at 02/16/2018 10:00 AM EST ----- Please call pt- overall, labs stable.  Sodium is low but not much lower than usual.  His liver and kidney function is okay.  Ammonia up a little. His potassium is high however.  Did he skip some doses of lasix?  I would like him to take one dose of kayexalate - 15 g po x 1 and repeat BMET on Monday.

## 2018-02-16 NOTE — Telephone Encounter (Signed)
Can you please go over his med list with his wife since I do think he was likely intoxicated when I saw him.

## 2018-02-16 NOTE — Telephone Encounter (Signed)
Copied from [place] [id number]. Topic: Quick Communication - See Telephone Encounter >> Feb 16, 2018  2:34 PM Bea Graff, NT wrote: CRM for notification. See Telephone encounter for: 02/16/18. Pts wife calling to request lab results from yesterday.

## 2018-02-17 NOTE — Telephone Encounter (Signed)
TA-I went over all meds with his wife and they are all correct/I discussed addiction therapy etc./I did a lot of research and mailed her everything I could find/I have it saved if you would like to have a copy/thx dmf

## 2018-02-20 ENCOUNTER — Ambulatory Visit: Payer: Medicare Other | Admitting: Family Medicine

## 2018-02-20 ENCOUNTER — Other Ambulatory Visit: Payer: Medicare Other

## 2018-02-20 DIAGNOSIS — M25522 Pain in left elbow: Secondary | ICD-10-CM | POA: Diagnosis not present

## 2018-02-20 DIAGNOSIS — Z4789 Encounter for other orthopedic aftercare: Secondary | ICD-10-CM | POA: Diagnosis not present

## 2018-02-20 DIAGNOSIS — M6281 Muscle weakness (generalized): Secondary | ICD-10-CM | POA: Diagnosis not present

## 2018-02-20 DIAGNOSIS — M25512 Pain in left shoulder: Secondary | ICD-10-CM | POA: Diagnosis not present

## 2018-02-22 DIAGNOSIS — Z4789 Encounter for other orthopedic aftercare: Secondary | ICD-10-CM | POA: Diagnosis not present

## 2018-02-22 DIAGNOSIS — M6281 Muscle weakness (generalized): Secondary | ICD-10-CM | POA: Diagnosis not present

## 2018-02-22 DIAGNOSIS — M25512 Pain in left shoulder: Secondary | ICD-10-CM | POA: Diagnosis not present

## 2018-02-22 DIAGNOSIS — M25522 Pain in left elbow: Secondary | ICD-10-CM | POA: Diagnosis not present

## 2018-02-27 DIAGNOSIS — Z4789 Encounter for other orthopedic aftercare: Secondary | ICD-10-CM | POA: Diagnosis not present

## 2018-02-27 DIAGNOSIS — M25512 Pain in left shoulder: Secondary | ICD-10-CM | POA: Diagnosis not present

## 2018-02-27 DIAGNOSIS — M25522 Pain in left elbow: Secondary | ICD-10-CM | POA: Diagnosis not present

## 2018-02-27 DIAGNOSIS — M6281 Muscle weakness (generalized): Secondary | ICD-10-CM | POA: Diagnosis not present

## 2018-03-01 DIAGNOSIS — Z4789 Encounter for other orthopedic aftercare: Secondary | ICD-10-CM | POA: Diagnosis not present

## 2018-03-01 DIAGNOSIS — M6281 Muscle weakness (generalized): Secondary | ICD-10-CM | POA: Diagnosis not present

## 2018-03-01 DIAGNOSIS — M25512 Pain in left shoulder: Secondary | ICD-10-CM | POA: Diagnosis not present

## 2018-03-01 DIAGNOSIS — M25522 Pain in left elbow: Secondary | ICD-10-CM | POA: Diagnosis not present

## 2018-03-03 DIAGNOSIS — M6281 Muscle weakness (generalized): Secondary | ICD-10-CM | POA: Diagnosis not present

## 2018-03-03 DIAGNOSIS — M25522 Pain in left elbow: Secondary | ICD-10-CM | POA: Diagnosis not present

## 2018-03-03 DIAGNOSIS — M25512 Pain in left shoulder: Secondary | ICD-10-CM | POA: Diagnosis not present

## 2018-03-03 DIAGNOSIS — Z4789 Encounter for other orthopedic aftercare: Secondary | ICD-10-CM | POA: Diagnosis not present

## 2018-03-06 DIAGNOSIS — M25512 Pain in left shoulder: Secondary | ICD-10-CM | POA: Diagnosis not present

## 2018-03-06 DIAGNOSIS — M6281 Muscle weakness (generalized): Secondary | ICD-10-CM | POA: Diagnosis not present

## 2018-03-06 DIAGNOSIS — Z4789 Encounter for other orthopedic aftercare: Secondary | ICD-10-CM | POA: Diagnosis not present

## 2018-03-06 DIAGNOSIS — M25522 Pain in left elbow: Secondary | ICD-10-CM | POA: Diagnosis not present

## 2018-03-07 NOTE — Telephone Encounter (Signed)
Pt was seen by ortho on 10/26/17.

## 2018-03-08 DIAGNOSIS — M25522 Pain in left elbow: Secondary | ICD-10-CM | POA: Diagnosis not present

## 2018-03-08 DIAGNOSIS — M6281 Muscle weakness (generalized): Secondary | ICD-10-CM | POA: Diagnosis not present

## 2018-03-08 DIAGNOSIS — M25512 Pain in left shoulder: Secondary | ICD-10-CM | POA: Diagnosis not present

## 2018-03-08 DIAGNOSIS — Z4789 Encounter for other orthopedic aftercare: Secondary | ICD-10-CM | POA: Diagnosis not present

## 2018-03-09 ENCOUNTER — Telehealth: Payer: Self-pay | Admitting: Family Medicine

## 2018-03-09 DIAGNOSIS — Z4789 Encounter for other orthopedic aftercare: Secondary | ICD-10-CM | POA: Diagnosis not present

## 2018-03-09 DIAGNOSIS — M25522 Pain in left elbow: Secondary | ICD-10-CM | POA: Diagnosis not present

## 2018-03-09 DIAGNOSIS — M6281 Muscle weakness (generalized): Secondary | ICD-10-CM | POA: Diagnosis not present

## 2018-03-09 DIAGNOSIS — M25512 Pain in left shoulder: Secondary | ICD-10-CM | POA: Diagnosis not present

## 2018-03-09 NOTE — Telephone Encounter (Signed)
Pt call stated he cant get lab work done at [facility] but he would like to get it done at [facility] instead due to lack of transportation. Please call his spouse back at [phone]., not sure if we need to change order in the system.

## 2018-03-10 ENCOUNTER — Telehealth: Payer: Self-pay | Admitting: Family Medicine

## 2018-03-10 NOTE — Telephone Encounter (Signed)
error 

## 2018-03-11 ENCOUNTER — Other Ambulatory Visit: Payer: Self-pay | Admitting: Internal Medicine

## 2018-03-11 DIAGNOSIS — K219 Gastro-esophageal reflux disease without esophagitis: Secondary | ICD-10-CM

## 2018-03-12 ENCOUNTER — Other Ambulatory Visit: Payer: Self-pay | Admitting: Family Medicine

## 2018-03-13 NOTE — Telephone Encounter (Signed)
Faxed future order to [facility] lab/called cell and VM full/called home and LMOVM stating that the order was faxed and gave number to call and to go to [facility] to register then 1st floor to have lab drawn/thx dmf

## 2018-03-17 ENCOUNTER — Other Ambulatory Visit (HOSPITAL_COMMUNITY)
Admission: RE | Admit: 2018-03-17 | Discharge: 2018-03-17 | Disposition: A | Discharge status: Home / Self Care | Payer: Medicare Other | Source: Ambulatory Visit | Attending: Family Medicine | Admitting: Family Medicine

## 2018-03-17 ENCOUNTER — Other Ambulatory Visit: Payer: Self-pay

## 2018-03-17 DIAGNOSIS — E875 Hyperkalemia: Secondary | ICD-10-CM | POA: Insufficient documentation

## 2018-03-17 LAB — BASIC METABOLIC PANEL
Anion gap: 10 (ref 5–15)
BUN: 13 mg/dL (ref 8–23)
CO2: 30 mmol/L (ref 22–32)
Calcium: 9.3 mg/dL (ref 8.9–10.3)
Chloride: 86 mmol/L — ABNORMAL LOW (ref 98–111)
Creatinine, Ser: 1.28 mg/dL — ABNORMAL HIGH (ref 0.61–1.24)
GFR calc Af Amer: >60 mL/min (ref >60)
GFR calc non Af Amer: 56 mL/min — ABNORMAL LOW (ref >60)
Glucose, Bld: 116 mg/dL — ABNORMAL HIGH (ref 70–99)
Potassium: 4.8 mmol/L (ref 3.5–5.1)
Sodium: 126 mmol/L — ABNORMAL LOW (ref 135–145)

## 2018-03-22 ENCOUNTER — Telehealth: Payer: Self-pay

## 2018-03-22 DIAGNOSIS — F10239 Alcohol dependence with withdrawal, unspecified: Secondary | ICD-10-CM

## 2018-03-22 DIAGNOSIS — E871 Hypo-osmolality and hyponatremia: Secondary | ICD-10-CM

## 2018-03-22 DIAGNOSIS — F10939 Alcohol use, unspecified with withdrawal, unspecified: Secondary | ICD-10-CM

## 2018-03-22 NOTE — Telephone Encounter (Signed)
Is he talking about his lab work from the hospital?

## 2018-03-22 NOTE — Telephone Encounter (Signed)
Copied from [place] [id number]. Topic: General - Other >> Mar 22, 2018 12:47 PM Ivar Drape wrote: Reason for CRM:   Patient would like the results of his recent labs Please advise, Pt was recently in the hospital .

## 2018-03-23 NOTE — Telephone Encounter (Signed)
His sodium is chronically low due to alcoholic liver cirrhosis and does not appear to have changed much over several months.  His renal function was down a bit and so a BMET should be rechecked.   I do not see any other new labs.

## 2018-03-23 NOTE — Telephone Encounter (Signed)
TA-This is not in the Lab tab as it should be/it is in the encouters/I have put it below for you to see/plz advise/thx dmf    OP Visit 03/17/2018 [facility] LAB Results   Procedure Component Value Ref Range Date/Time  Basic metabolic panel [[id number]] (Abnormal) Collected: 03/17/18 0945  Order Status: Completed Updated: 03/17/18 1049   Sodium 126Low  135 - 145 mmol/L    Potassium 4.8 3.5 - 5.1 mmol/L    Chloride 86Low  98 - 111 mmol/L    CO2 30 22 - 32 mmol/L    Glucose, Bld 116High  70 - 99 mg/dL    BUN 13 8 - 23 mg/dL    Creatinine, Ser 1.28High  0.61 - 1.24 mg/dL    Calcium 9.3 8.9 - 10.3 mg/dL    GFR calc non Af Amer 56Low  >60 mL/min    GFR calc Af Amer >60 >60 mL/min    Anion gap 10 5 - 15    Comment: Performed at [facility] [address], [place], [place] [address]

## 2018-03-24 ENCOUNTER — Telehealth: Payer: Self-pay

## 2018-03-24 NOTE — Telephone Encounter (Signed)
He has chronic alcoholic liver disease and so certainly his labs can contribute to cramping and other issues.  He would need repeat CMET when he could get it done.

## 2018-03-24 NOTE — Telephone Encounter (Signed)
Added this information to previous open encounter and sent to provider/thx dmf

## 2018-03-24 NOTE — Telephone Encounter (Signed)
Pt's wife returning call. NT unavailable.

## 2018-03-24 NOTE — Telephone Encounter (Signed)
PEC-Ok to give Dr. Hulen Shouts response to pt if he or his wife returns call/he has chronically low sodium due to alcoholic liver cirrhosis which could be the cause the changes in his lab and contribute to cramping and other issues  I advised this vaguely on VM and that I need to fax the order over for him to go to the [facility] lab to repeat a CMP ASAP as we need to be certain that they are normalizing and that I received his letter via mail that he has stopped drinking as of 3.1.20/created future order/faxing over/thx dmf  (Faxed to [facility] @ [phone] their phone is [phone])

## 2018-03-24 NOTE — Telephone Encounter (Signed)
Copied from [place] [phone]. Topic: General - Other >> Mar 24, 2018 11:17 AM Jeri Cos wrote: Reason for CRM: Pt's wife called wanting to know lab results for pt. Pt has been complaining of cramping in his legs since Wed. No note on file to allow [facility] to release lab results.

## 2018-03-24 NOTE — Addendum Note (Signed)
Addended by: Marrion Coy on: 03/24/2018 04:04 PM   Modules accepted: Orders

## 2018-03-24 NOTE — Telephone Encounter (Signed)
TA-This lab completed at [facility] was the order I sent for a recheck since he could not come this far/when should I fax another order to recheck?  Also; wife called stating the following: Pt has been complaining of cramping in his legs since Wed.  Could any of those results be the causative of his cramping in his legs? I also received a letter from him stating that he quit drinking again on 3.1.20/plz advise of these matters and I will place the order and discuss with pt/thx dmf

## 2018-03-27 NOTE — Telephone Encounter (Signed)
Wife given results and appointment made for recheck.

## 2018-03-28 ENCOUNTER — Other Ambulatory Visit (HOSPITAL_COMMUNITY)
Admission: RE | Admit: 2018-03-28 | Discharge: 2018-03-28 | Disposition: A | Discharge status: Home / Self Care | Payer: Medicare Other | Source: Ambulatory Visit | Attending: Family Medicine | Admitting: Family Medicine

## 2018-03-28 ENCOUNTER — Other Ambulatory Visit: Payer: Self-pay

## 2018-03-28 DIAGNOSIS — E871 Hypo-osmolality and hyponatremia: Secondary | ICD-10-CM | POA: Insufficient documentation

## 2018-03-28 DIAGNOSIS — F10239 Alcohol dependence with withdrawal, unspecified: Secondary | ICD-10-CM | POA: Insufficient documentation

## 2018-03-28 LAB — COMPREHENSIVE METABOLIC PANEL WITH GFR
ALT: 19 U/L (ref 0–44)
AST: 28 U/L (ref 15–41)
Albumin: 4.2 g/dL (ref 3.5–5.0)
Alkaline Phosphatase: 70 U/L (ref 38–126)
Anion gap: 9 (ref 5–15)
BUN: 15 mg/dL (ref 8–23)
CO2: 26 mmol/L (ref 22–32)
Calcium: 9.3 mg/dL (ref 8.9–10.3)
Chloride: 91 mmol/L — ABNORMAL LOW (ref 98–111)
Creatinine, Ser: 1.05 mg/dL (ref 0.61–1.24)
GFR calc Af Amer: >60 mL/min (ref >60)
GFR calc non Af Amer: >60 mL/min (ref >60)
Glucose, Bld: 116 mg/dL — ABNORMAL HIGH (ref 70–99)
Potassium: 4.8 mmol/L (ref 3.5–5.1)
Sodium: 126 mmol/L — ABNORMAL LOW (ref 135–145)
Total Bilirubin: 0.9 mg/dL (ref 0.3–1.2)
Total Protein: 7 g/dL (ref 6.5–8.1)

## 2018-03-31 ENCOUNTER — Telehealth: Payer: Self-pay | Admitting: Family Medicine

## 2018-03-31 ENCOUNTER — Encounter: Payer: Self-pay | Admitting: Family Medicine

## 2018-03-31 NOTE — Telephone Encounter (Signed)
Copied from [place] [phone]. Topic: Quick Communication - See Telephone Encounter >> Mar 31, 2018 12:27 PM Bea Graff, NT wrote: CRM for notification. See Telephone encounter for: 03/31/18. Pts wife calling to receive lab results this pt had done on 03/28/2018.

## 2018-04-06 ENCOUNTER — Other Ambulatory Visit: Payer: Medicare Other | Admitting: Family Medicine

## 2018-04-06 NOTE — Telephone Encounter (Signed)
See my chart encounter.

## 2018-04-19 ENCOUNTER — Other Ambulatory Visit: Payer: Self-pay | Admitting: Family Medicine

## 2018-04-19 DIAGNOSIS — R0609 Other forms of dyspnea: Principal | ICD-10-CM

## 2018-04-19 DIAGNOSIS — M7989 Other specified soft tissue disorders: Secondary | ICD-10-CM

## 2018-04-25 ENCOUNTER — Other Ambulatory Visit: Payer: Self-pay

## 2018-04-25 DIAGNOSIS — K219 Gastro-esophageal reflux disease without esophagitis: Secondary | ICD-10-CM

## 2018-04-25 DIAGNOSIS — G8929 Other chronic pain: Secondary | ICD-10-CM

## 2018-04-25 DIAGNOSIS — I10 Essential (primary) hypertension: Secondary | ICD-10-CM

## 2018-04-25 DIAGNOSIS — M25512 Pain in left shoulder: Secondary | ICD-10-CM

## 2018-04-25 MED ORDER — METHOCARBAMOL 750 MG PO TABS: 750.0000 mg | ORAL_TABLET | Freq: Two times a day (BID) | ORAL | 0 refills | Status: AC | PRN

## 2018-04-25 MED ORDER — TRAZODONE HCL 50 MG PO TABS: 50.0000 mg | ORAL_TABLET | Freq: Every day | ORAL | 2 refills | Status: AC

## 2018-04-25 MED ORDER — EPINEPHRINE 0.3 MG/0.3ML IJ SOAJ: INTRAMUSCULAR | 2 refills | Status: AC

## 2018-04-25 MED ORDER — PANTOPRAZOLE SODIUM 40 MG PO TBEC: 40.0000 mg | DELAYED_RELEASE_TABLET | Freq: Every day | ORAL | 2 refills | Status: AC

## 2018-04-25 MED ORDER — METOPROLOL TARTRATE 25 MG PO TABS: 25.0000 mg | ORAL_TABLET | Freq: Two times a day (BID) | ORAL | 2 refills | Status: AC

## 2018-05-03 ENCOUNTER — Other Ambulatory Visit: Payer: Self-pay

## 2018-05-03 DIAGNOSIS — J309 Allergic rhinitis, unspecified: Secondary | ICD-10-CM

## 2018-05-03 MED ORDER — FLUTICASONE PROPIONATE 50 MCG/ACT NA SUSP: 1.0000 | Freq: Two times a day (BID) | NASAL | 3 refills | Status: AC | PRN

## 2018-05-08 ENCOUNTER — Telehealth: Payer: Self-pay | Admitting: Family Medicine

## 2018-05-08 NOTE — Telephone Encounter (Signed)
[facility] ER Tab 300 mg, this rx discontinue back in 08/26/2017 due to fall (please see note back in 08/2017). Please advise, last ov with Dr. Deborra Medina was 02/2018.

## 2018-05-09 NOTE — Telephone Encounter (Signed)
I called and talked to patient, he is aware he needs an appointment and that we are doing virtual visits. Patient said he will call back at a later date to schedule appointment.

## 2018-05-09 NOTE — Telephone Encounter (Signed)
Please help call the pt and offer doxy to discuss about this med.

## 2018-05-09 NOTE — Telephone Encounter (Signed)
Perhaps we need to schedule a doxy me for this.

## 2018-06-07 ENCOUNTER — Telehealth: Payer: Self-pay

## 2018-06-07 NOTE — Telephone Encounter (Signed)
Copied from [place] [phone]. Topic: General - Inquiry >> Jun 07, 2018 11:53 AM Richardo Priest, NT wrote: Reason for CRM: Patient calling in stating he would like antibiotics prescribed for him. States he is asking due to his over abundance on mucus that is keeping him up at night. Call back is [phone].

## 2018-06-09 ENCOUNTER — Other Ambulatory Visit: Payer: Self-pay

## 2018-06-09 DIAGNOSIS — K7031 Alcoholic cirrhosis of liver with ascites: Secondary | ICD-10-CM

## 2018-06-09 MED ORDER — SPIRONOLACTONE 50 MG PO TABS: 25.0000 mg | ORAL_TABLET | Freq: Every day | ORAL | 1 refills | Status: AC

## 2018-06-09 NOTE — Telephone Encounter (Signed)
I LMOVM advising to call the office to schedule a phone visit with a provider today/he is without transportation/advised that we cannot just prescribe an abx without some type of evaluation/thx dmf

## 2018-06-09 NOTE — Telephone Encounter (Signed)
Pt called back and cannot do today because his wife has the phone at work, he at home. No way to do virtual visit.  Pt states he is ok to wait until Tuesday.

## 2018-06-09 NOTE — Telephone Encounter (Signed)
Thank you/thx dmf

## 2018-06-13 ENCOUNTER — Telehealth: Payer: Self-pay | Admitting: Family Medicine

## 2018-06-13 ENCOUNTER — Ambulatory Visit (INDEPENDENT_AMBULATORY_CARE_PROVIDER_SITE_OTHER): Payer: Medicare Other | Admitting: Family Medicine

## 2018-06-13 VITALS — BP 120/60

## 2018-06-13 DIAGNOSIS — R05 Cough: Secondary | ICD-10-CM

## 2018-06-13 DIAGNOSIS — J309 Allergic rhinitis, unspecified: Secondary | ICD-10-CM

## 2018-06-13 DIAGNOSIS — R058 Other specified cough: Secondary | ICD-10-CM | POA: Insufficient documentation

## 2018-06-13 MED ORDER — CETIRIZINE HCL 10 MG PO TABS: 10.0000 mg | ORAL_TABLET | Freq: Every day | ORAL | 3 refills | Status: DC

## 2018-06-13 MED ORDER — PROMETHAZINE-DM 6.25-15 MG/5ML PO SYRP: 5.0000 mL | ORAL_SOLUTION | Freq: Four times a day (QID) | ORAL | 0 refills | Status: DC | PRN

## 2018-06-13 MED ORDER — CETIRIZINE HCL 10 MG PO TABS: 10.0000 mg | ORAL_TABLET | Freq: Every day | ORAL | 0 refills | Status: AC

## 2018-06-13 MED ORDER — AZITHROMYCIN 250 MG PO TABS: ORAL_TABLET | ORAL | 0 refills | Status: DC

## 2018-06-13 NOTE — Progress Notes (Signed)
TELEPHONE ENCOUNTER   Patient verbally agreed to telephone visit and is aware that copayment and coinsurance may apply. Patient was treated using telemedicine according to accepted telemedicine protocols.  Location of the patient: Patient's home  Location of provider: Provider's home Names of all persons participating in the telemedicine service and role in the encounter: [name], MD Hanley Seamen  Subjective:   Chief Complaint  Patient presents with  . Cough    Pt agrees to phone visit/He is C/O a productive cough x3wks with yellow sputum.  The post-nasal-drip keeps him up at night. Has been Tx with several OTC meds along with Rx'ed mes per wife. Afebrile, no abn body aches, fatigued, has SOB from coughing fits. I could hear the wheezing.     HPI   Productive cough x 3 weeks. PND and cough is keeping him up at night. Has tried several OTC meds.  Afebrile.  No body aches.  He is fatigued and SOB with coughing fits.   Mucinex has helped to break up the mucous.    He does not think he has been wheezing.  He more troubled by the mucous- what he coughs up is yellow.  Denies fever, chest pain, sinus pressure or other symptoms.  Patient Active Problem List   Diagnosis Date Noted  . Cough present for greater than 3 weeks 06/13/2018  . Chronic hyponatremia 02/15/2018  . Driving safety issue 02/15/2018  . Anasarca   . DOE (dyspnea on exertion)   . Acute metabolic encephalopathy 84/66/5993  . Hypoglycemia 01/17/2018  . GERD (gastroesophageal reflux disease) 01/17/2018  . SOB (shortness of breath) 01/17/2018  . Encephalopathy   . Palliative care by specialist   . Goals of care, counseling/discussion   . Endotracheally intubated   . History of ETT   . Acute respiratory failure with hypoxemia ([place])   . Acute respiratory insufficiency   . Closed fracture of left olecranon process 10/27/2017  . Fracture of left olecranon process, closed, initial encounter 10/27/2017  . Frequent  falls 06/29/2017  . Fall 04/09/2017  . Scalp laceration   . Hypomagnesemia 10/25/2016  . Hip fracture ([place]) 10/24/2016  . Displaced fracture of left femoral neck ([place]) 10/24/2016  . Thrombocytopenia ([place]) 10/24/2016  . Closed displaced fracture of left femoral neck ([place]) 10/23/2016  . Contracture of muscle of left hand 09/15/2016  . Encounter for nasogastric tube placement 07/08/2016  . Infected laceration 07/08/2016  . Generalized muscle weakness 07/08/2016  . Anemia 05/18/2016  . Syncope and collapse   . Syncope 05/17/2016  . Rib fractures 05/17/2016  . Bullous pemphigoid 05/17/2016  . ETOH abuse 03/17/2016  . Dizziness and giddiness 12/09/2015  . Chronic pain syndrome 12/09/2015  . Arthritis 11/20/2015  . Mild CAD   . DCM (dilated cardiomyopathy) ([place])   . Abnormal nuclear stress test 05/07/2015  . PAC (premature atrial contraction) 04/07/2015  . Atrial tachycardia, paroxysmal ([place]) 03/10/2015  . Depression 04/12/2012  . C2 cervical fracture ([place]) 02/29/2012  . Alcohol withdrawal ([place]) 11/06/2011  . Fracture of humerus, proximal, left, closed 11/02/2011  . Hyponatremia 11/17/2010  . H/O: upper GI bleed 10/20/2010  . Stomach ulcer from aspirin/ibuprofen-like drugs (NSAID's) 10/20/2010  . Colon polyp 08/26/2010  . Diverticulosis of colon (without mention of hemorrhage) 08/26/2010  . BPH (benign prostatic hyperplasia) 07/02/2010  . Deficiency anemia 03/09/2010  . Hyposmolality and/or hyponatremia 10/17/2009  . ALCOHOLIC CIRRHOSIS OF LIVER 07/25/2009  . Hypokalemia 04/03/2007  . Anxiety state 01/24/2007  . HLD (hyperlipidemia)  07/28/2006  . Alcohol abuse 07/28/2006  . Essential hypertension 07/28/2006  . IMPOTENCE, ORGANIC ORIGIN 07/28/2006   Social History   Tobacco Use  . Smoking status: Former Smoker    Types: Pipe  . Smokeless tobacco: Never Used  . Tobacco comment: 05/17/2016 "stopped in the late 1990s"  Substance Use Topics  . Alcohol use: Not Currently     Alcohol/week: 28.0 standard drinks    Types: 28 Glasses of wine per week    Comment: last drink 10/22/17    Current Outpatient Medications:  .  aspirin EC 81 MG tablet, Take 81 mg by mouth daily., Disp: , Rfl:  .  cetirizine (ZYRTEC) 10 MG tablet, Take 1 tablet (10 mg total) by mouth daily., Disp: 90 tablet, Rfl: 3 .  Cyanocobalamin (VITAMIN B-12 PO), Take 1 tablet by mouth daily., Disp: , Rfl:  .  Elastic Bandages & Supports (LUMBAR BACK BRACE/SUPPORT PAD) MISC, 1 Units by Does not apply route daily as needed., Disp: 1 each, Rfl: 0 .  EPINEPHrine (EPIPEN 2-PAK) 0.3 mg/0.3 mL IJ SOAJ injection, INJECT IN TO THE MUSCLE AS DIRECTED FOR SEVERE ALLERGICE REACTION, Disp: 2 Device, Rfl: 2 .  fluticasone (FLONASE) 50 MCG/ACT nasal spray, Place 1 spray into both nostrils 2 (two) times daily as needed for allergies., Disp: 48 g, Rfl: 3 .  folic acid (FOLVITE) 1 MG tablet, Take 1 tablet (1 mg total) by mouth daily., Disp: , Rfl:  .  furosemide (LASIX) 20 MG tablet, TAKE 2 TABLETS DAILY, Disp: 180 tablet, Rfl: 3 .  lactulose (CHRONULAC) 10 GM/15ML solution, Take 15 mLs (10 g total) by mouth 2 (two) times daily as needed for mild constipation (titrate to at least 2-3 loose BM daily)., Disp: 473 mL, Rfl: 5 .  lidocaine (XYLOCAINE) 5 % ointment, Apply 1 application topically as needed. (Patient taking differently: Apply 1 application topically 2 (two) times daily as needed for mild pain. ), Disp: 50 g, Rfl: 1 .  magnesium oxide (MAG-OX) 400 (241.3 Mg) MG tablet, Take 1 tablet (400 mg total) by mouth daily., Disp: 30 tablet, Rfl: 0 .  Melatonin 5 MG TABS, Take 20 mg by mouth at bedtime., Disp: , Rfl:  .  methocarbamol (ROBAXIN) 750 MG tablet, Take 1 tablet (750 mg total) by mouth 2 (two) times daily as needed for muscle spasms., Disp: 60 tablet, Rfl: 0 .  metoprolol tartrate (LOPRESSOR) 25 MG tablet, Take 1 tablet (25 mg total) by mouth 2 (two) times daily., Disp: 180 tablet, Rfl: 2 .  Multiple Vitamin  (MULTIVITAMIN WITH MINERALS) TABS tablet, Take 1 tablet by mouth daily., Disp: , Rfl:  .  ondansetron (ZOFRAN) 4 MG tablet, Take 1-2 tablets (4-8 mg total) by mouth every 8 (eight) hours as needed for nausea or vomiting., Disp: 120 tablet, Rfl: 3 .  pantoprazole (PROTONIX) 40 MG tablet, Take 1 tablet (40 mg total) by mouth daily., Disp: 90 tablet, Rfl: 2 .  promethazine (PHENERGAN) 25 MG tablet, TAKE 1 TABLET DAILY AS NEEDED FOR NAUSEA, Disp: 90 tablet, Rfl: 4 .  spironolactone (ALDACTONE) 50 MG tablet, Take 0.5-1 tablets (25-50 mg total) by mouth daily., Disp: 90 tablet, Rfl: 1 .  Thiamine HCl (VITAMIN B-1) 100 MG tablet, Take 100 mg by mouth daily. Reported on 02/17/2015, Disp: , Rfl:  .  traZODone (DESYREL) 50 MG tablet, Take 1 tablet (50 mg total) by mouth at bedtime., Disp: 90 tablet, Rfl: 2 .  azithromycin (ZITHROMAX) 250 MG tablet, 2 tabs by mouth on  day 1 followed by 1 tab by mouth daily days 2-5, Disp: 6 tablet, Rfl: 0 .  promethazine-dextromethorphan (PROMETHAZINE-DM) 6.25-15 MG/5ML syrup, Take 5 mLs by mouth 4 (four) times daily as needed., Disp: 240 mL, Rfl: 0 Allergies  Allergen Reactions  . Nifedipine Swelling    11/02/2011 pt denies this allergy (??)  . Bee Venom Swelling    Assessment & Plan:   1. Cough present for greater than 3 weeks     No orders of the defined types were placed in this encounter.  Meds ordered this encounter  Medications  . azithromycin (ZITHROMAX) 250 MG tablet    Sig: 2 tabs by mouth on day 1 followed by 1 tab by mouth daily days 2-5    Dispense:  6 tablet    Refill:  0  . promethazine-dextromethorphan (PROMETHAZINE-DM) 6.25-15 MG/5ML syrup    Sig: Take 5 mLs by mouth 4 (four) times daily as needed.    Dispense:  240 mL    Refill:  0    [name], MD 06/13/2018  Time spent with the patient: 11 minutes, spent in obtaining information about his symptoms, reviewing his previous labs, evaluations, and treatments, counseling him about his condition  (please see the discussed topics above), and developing a plan to further investigate it; he had a number of questions which I addressed.   [id number] physician/qualified health professional telephone evaluation 5 to 10 minutes [id number] physician/qualified help functional [place] evaluation for 11 to 20 minutes [id number] physician/qualify he will professional telephone evaluation for 21 to 30 minutes

## 2018-06-13 NOTE — Telephone Encounter (Signed)
Copied from [place] [id number]. Topic: Quick Communication - Rx Refill/Question >> Jun 13, 2018 11:18 AM Richardo Priest, NT wrote: Medication:  cetirizine (ZYRTEC) 10 MG tablet  Has the patient contacted their pharmacy? Yes patient has no more refills  Preferred Pharmacy (with phone number or street name):  [facility] on [address]. [facility]   Agent: Please be advised that RX refills may take up to 3 business days. We ask that you follow-up with your pharmacy.

## 2018-06-13 NOTE — Telephone Encounter (Signed)
Completed/thx dmf 

## 2018-06-13 NOTE — Assessment & Plan Note (Signed)
Given duration and progression of symptoms, will treat for bacterial bronchitis/PNA with zpack.  Continue mucinex, push fluids.  eRx sent in for promethazine DM to use as needed for severe cough.  He is a fall risk and I discussed this at length with him that this cough suppressant can cause sedation. Call or send my chart message prn if these symptoms worsen or fail to improve as anticipated. The patient indicates understanding of these issues and agrees with the plan.

## 2018-06-20 ENCOUNTER — Telehealth: Payer: Self-pay | Admitting: Family Medicine

## 2018-06-20 NOTE — Telephone Encounter (Unsigned)
Copied from [place] [phone]. Topic: General - Other >> Jun 20, 2018  8:34 AM Celene Kras A wrote: Reason for CRM: PT called stating he is still experiencing a cough and mucus after his round of antibiotics. Pt is requesting to having a stronger medication be sent in. Please advise.  [facility], [address] [place] [address] Phone: [phone] Fax: [phone] Not a 24 hour pharmacy; exact hours not known.

## 2018-06-21 ENCOUNTER — Telehealth: Payer: Self-pay | Admitting: Family Medicine

## 2018-06-21 MED ORDER — AZITHROMYCIN 250 MG PO TABS: ORAL_TABLET | ORAL | 0 refills | Status: DC

## 2018-06-21 NOTE — Telephone Encounter (Signed)
Noted.  Will try another round of zpack.  If symptoms deteriorate again, please let us know.

## 2018-06-21 NOTE — Telephone Encounter (Signed)
Pt aware/thx dmf 

## 2018-06-21 NOTE — Telephone Encounter (Signed)
Copied from [place] [phone]. Topic: Quick Communication - See Telephone Encounter >> Jun 21, 2018  9:36 AM Reyne Dumas L wrote: CRM for notification. See Telephone encounter for: 06/21/18.  Pt states since he ran out of antibiotics his cough is getting worse and he is secreting so much mucus that he now has to sleep sitting up.  Pt would like to know what to do. Pt can be reached at [phone]

## 2018-06-21 NOTE — Telephone Encounter (Signed)
TA-Plz see pt message below/he has completed abx and his cough is beginning to worsen/had to sleep sitting up/is wondering if needs another round of abx? Plz advise/thx dmf

## 2018-06-22 NOTE — Telephone Encounter (Signed)
TA already sent in a 2nd round of abx and pt is aware/thx dmf

## 2018-06-26 ENCOUNTER — Other Ambulatory Visit: Payer: Self-pay | Admitting: Family Medicine

## 2018-06-28 ENCOUNTER — Ambulatory Visit: Payer: Medicare Other | Admitting: Family Medicine

## 2018-06-28 DIAGNOSIS — R05 Cough: Secondary | ICD-10-CM | POA: Insufficient documentation

## 2018-06-28 DIAGNOSIS — R053 Chronic cough: Secondary | ICD-10-CM | POA: Insufficient documentation

## 2018-06-28 NOTE — Progress Notes (Unsigned)
Visit canceled

## 2018-06-28 NOTE — Assessment & Plan Note (Addendum)
>  25 minutes spent in face to face time with patient, >50% spent in counselling or coordination of care with patient and his wife.  He is high risk for aspiration PNA given the severity of his alcohol abuse.  Discussed having him come in for CXR.  Augmentin would be more appropriate choice for aspiration pna.  Augmentin eRx sent to pharmacy on file and he will come in for CXR today.  Tessalon sent in for cough as I am worried about him being an extreme fall risk.  The patient and his wife indicates understanding of these issues and agrees with the plan. Orders Placed This Encounter  Procedures  . DG Chest 2 View

## 2018-06-28 NOTE — Progress Notes (Signed)
TELEPHONE ENCOUNTER   Patient verbally agreed to telephone visit and is aware that copayment and coinsurance may apply. Patient was treated using telemedicine according to accepted telemedicine protocols.  Location of the patient: home Location of provider:provider's home Names of all persons participating in the telemedicine service and role in the encounter: [name], MD, Hanley Seamen, Bing Ree (patient's wife).  Subjective:   Chief Complaint  Patient presents with   Cough    Pt agrees to phone visit. Persistent cough after two rounds of antibiotics and cough medication. Has coughing fits keeps him up all night. Color of sputum is white. Nasal mucous ranges from white to yellow that runns intermittently. Post-nasal-drip. denies throat pain. Has SOB after the coughing fits. Denies N&V or diarrhea. Has Tx with DayQuil, NyQuil but Sx persist. I can hear crackling and wheezing while he is breathing on the phone.     HPI   Cough- Spoke with Burman Nieves first.  She admits he is drinking at least 6 glasses of wine per night (? 1-2 bottles).  Last had a telephone visit with patient for this complaint on 06/13/18.  Note reviewed.  At that time, he complained of a productive cough x 3 weeks. PND and cough was keeping him up at night. Had tried several OTC meds.    Mucinex has helped to break up the mucous.    He was most troubled by the mucous- what he coughs up is yellow.  Denied fever, chest pain, sinus pressure or other symptoms.  Given duration and progression of symptoms, I treated him  for bacterial bronchitis/PNA with zpack.  Advised to take  mucinex, push fluids.  eRx also sent in for promethazine DM to use as needed for severe cough.  He is a fall risk and I discussed this at length with him that this cough suppressant can cause sedation.  He then called the office on 06/21/18 with the following complaint:  Pt states since he ran out of antibiotics his cough is getting  worse and he is secreting so much mucus that he now has to sleep sitting up.  Pt would like to know what to do.  I therefore sent in another round of zpack. He improve a little with the two rounds of zpack  His cough has persisted. He is fatigued, remains afebrile.  Has coughing fits keeps him up all night. Color of sputum is white. Nasal mucous ranges from white to yellow that runns intermittently. Post-nasal-drip. denies throat pain. Has SOB after the coughing fits. Denies N&V or diarrhea. Has Tx with DayQuil, NyQuil but Sx persist. I can hear crackling and wheezing while he is breathing on the phone.  \ Patient Active Problem List   Diagnosis Date Noted   Persistent cough for 3 weeks or longer 06/28/2018   Cough present for greater than 3 weeks 06/13/2018   Chronic hyponatremia 02/15/2018   Driving safety issue 02/15/2018   Anasarca    DOE (dyspnea on exertion)    Acute metabolic encephalopathy 62/95/2841   Hypoglycemia 01/17/2018   GERD (gastroesophageal reflux disease) 01/17/2018   SOB (shortness of breath) 01/17/2018   Encephalopathy    Palliative care by specialist    Goals of care, counseling/discussion    Endotracheally intubated    History of ETT    Acute respiratory failure with hypoxemia ([place])    Acute respiratory insufficiency    Closed fracture of left olecranon process 10/27/2017   Fracture of left olecranon process, closed, initial encounter  10/27/2017   Frequent falls 06/29/2017   Fall 04/09/2017   Scalp laceration    Hypomagnesemia 10/25/2016   Hip fracture ([place]) 10/24/2016   Displaced fracture of left femoral neck ([place]) 10/24/2016   Thrombocytopenia ([place]) 10/24/2016   Closed displaced fracture of left femoral neck ([place]) 10/23/2016   Contracture of muscle of left hand 09/15/2016   Encounter for nasogastric tube placement 07/08/2016   Infected laceration 07/08/2016   Generalized muscle weakness 07/08/2016   Anemia 05/18/2016     Syncope and collapse    Syncope 05/17/2016   Rib fractures 05/17/2016   Bullous pemphigoid 05/17/2016   ETOH abuse 03/17/2016   Dizziness and giddiness 12/09/2015   Chronic pain syndrome 12/09/2015   Arthritis 11/20/2015   Mild CAD    DCM (dilated cardiomyopathy) ([place])    Abnormal nuclear stress test 05/07/2015   PAC (premature atrial contraction) 04/07/2015   Atrial tachycardia, paroxysmal ([place]) 03/10/2015   Depression 04/12/2012   C2 cervical fracture ([place]) 02/29/2012   Alcohol withdrawal ([place]) 11/06/2011   Fracture of humerus, proximal, left, closed 11/02/2011   Hyponatremia 11/17/2010   H/O: upper GI bleed 10/20/2010   Stomach ulcer from aspirin/ibuprofen-like drugs (NSAID's) 10/20/2010   Colon polyp 08/26/2010   Diverticulosis of colon (without mention of hemorrhage) 08/26/2010   BPH (benign prostatic hyperplasia) 07/02/2010   Deficiency anemia 03/09/2010   Hyposmolality and/or hyponatremia 65/03/5463   ALCOHOLIC CIRRHOSIS OF LIVER 07/25/2009   Hypokalemia 04/03/2007   Anxiety state 01/24/2007   HLD (hyperlipidemia) 07/28/2006   Alcohol abuse 07/28/2006   Essential hypertension 07/28/2006   IMPOTENCE, ORGANIC ORIGIN 07/28/2006   Social History   Tobacco Use   Smoking status: Former Smoker    Types: Pipe   Smokeless tobacco: Never Used   Tobacco comment: 05/17/2016 "stopped in the late 1990s"  Substance Use Topics   Alcohol use: Not Currently    Alcohol/week: 28.0 standard drinks    Types: 28 Glasses of wine per week    Comment: last drink 10/22/17    Current Outpatient Medications:    aspirin EC 81 MG tablet, Take 81 mg by mouth daily., Disp: , Rfl:    cetirizine (ZYRTEC) 10 MG tablet, Take 1 tablet (10 mg total) by mouth daily., Disp: 30 tablet, Rfl: 0   Cyanocobalamin (VITAMIN B-12 PO), Take 1 tablet by mouth daily., Disp: , Rfl:    Elastic Bandages & Supports (LUMBAR BACK BRACE/SUPPORT PAD) MISC, 1 Units by Does not  apply route daily as needed., Disp: 1 each, Rfl: 0   EPINEPHrine (EPIPEN 2-PAK) 0.3 mg/0.3 mL IJ SOAJ injection, INJECT IN TO THE MUSCLE AS DIRECTED FOR SEVERE ALLERGICE REACTION, Disp: 2 Device, Rfl: 2   fluticasone (FLONASE) 50 MCG/ACT nasal spray, Place 1 spray into both nostrils 2 (two) times daily as needed for allergies., Disp: 48 g, Rfl: 3   folic acid (FOLVITE) 1 MG tablet, Take 1 tablet (1 mg total) by mouth daily., Disp: , Rfl:    furosemide (LASIX) 20 MG tablet, TAKE 2 TABLETS DAILY, Disp: 180 tablet, Rfl: 3   lactulose (CHRONULAC) 10 GM/15ML solution, Take 15 mLs (10 g total) by mouth 2 (two) times daily as needed for mild constipation (titrate to at least 2-3 loose BM daily)., Disp: 473 mL, Rfl: 5   lidocaine (XYLOCAINE) 5 % ointment, Apply 1 application topically as needed. (Patient taking differently: Apply 1 application topically 2 (two) times daily as needed for mild pain. ), Disp: 50 g, Rfl: 1   magnesium oxide (MAG-OX)  400 (241.3 Mg) MG tablet, Take 1 tablet (400 mg total) by mouth daily., Disp: 30 tablet, Rfl: 0   Melatonin 5 MG TABS, Take 20 mg by mouth at bedtime., Disp: , Rfl:    methocarbamol (ROBAXIN) 750 MG tablet, Take 1 tablet (750 mg total) by mouth 2 (two) times daily as needed for muscle spasms., Disp: 60 tablet, Rfl: 0   metoprolol tartrate (LOPRESSOR) 25 MG tablet, Take 1 tablet (25 mg total) by mouth 2 (two) times daily., Disp: 180 tablet, Rfl: 2   Multiple Vitamin (MULTIVITAMIN WITH MINERALS) TABS tablet, Take 1 tablet by mouth daily., Disp: , Rfl:    ondansetron (ZOFRAN) 4 MG tablet, Take 1-2 tablets (4-8 mg total) by mouth every 8 (eight) hours as needed for nausea or vomiting., Disp: 120 tablet, Rfl: 3   pantoprazole (PROTONIX) 40 MG tablet, Take 1 tablet (40 mg total) by mouth daily., Disp: 90 tablet, Rfl: 2   promethazine (PHENERGAN) 25 MG tablet, TAKE 1 TABLET DAILY AS NEEDED FOR NAUSEA, Disp: 90 tablet, Rfl: 4   spironolactone (ALDACTONE) 50 MG  tablet, Take 0.5-1 tablets (25-50 mg total) by mouth daily., Disp: 90 tablet, Rfl: 1   Thiamine HCl (VITAMIN B-1) 100 MG tablet, Take 100 mg by mouth daily. Reported on 02/17/2015, Disp: , Rfl:    traZODone (DESYREL) 50 MG tablet, Take 1 tablet (50 mg total) by mouth at bedtime., Disp: 90 tablet, Rfl: 2   amoxicillin-clavulanate (AUGMENTIN) 875-125 MG tablet, Take 1 tablet by mouth 2 (two) times daily for 10 days., Disp: 20 tablet, Rfl: 0   benzonatate (TESSALON) 200 MG capsule, Take 1 capsule (200 mg total) by mouth 2 (two) times daily as needed for cough., Disp: 20 capsule, Rfl: 0 Allergies  Allergen Reactions   Nifedipine Swelling    11/02/2011 pt denies this allergy (??)   Bee Venom Swelling    Assessment & Plan:   1. Cough present for greater than 3 weeks     Orders Placed This Encounter  Procedures   DG Chest 2 View   Meds ordered this encounter  Medications   amoxicillin-clavulanate (AUGMENTIN) 875-125 MG tablet    Sig: Take 1 tablet by mouth 2 (two) times daily for 10 days.    Dispense:  20 tablet    Refill:  0   benzonatate (TESSALON) 200 MG capsule    Sig: Take 1 capsule (200 mg total) by mouth 2 (two) times daily as needed for cough.    Dispense:  20 capsule    Refill:  0    [name], MD 06/29/2018  Time spent with the patient: 25  minutes, spent in obtaining information about his symptoms, reviewing his previous labs, evaluations, and treatments, counseling him about his condition (please see the discussed topics above), and developing a plan to further investigate it; he had a number of questions which I addressed.   [id number] physician/qualified health professional telephone evaluation 5 to 10 minutes [id number] physician/qualified help functional [place] evaluation for 11 to 20 minutes [id number] physician/qualify he will professional telephone evaluation for 21 to 30 minutes

## 2018-06-29 ENCOUNTER — Other Ambulatory Visit: Payer: Self-pay | Admitting: Family Medicine

## 2018-06-29 ENCOUNTER — Telehealth: Payer: Self-pay

## 2018-06-29 ENCOUNTER — Encounter: Payer: Self-pay | Admitting: Family Medicine

## 2018-06-29 ENCOUNTER — Ambulatory Visit (INDEPENDENT_AMBULATORY_CARE_PROVIDER_SITE_OTHER): Payer: Medicare Other | Admitting: Family Medicine

## 2018-06-29 ENCOUNTER — Other Ambulatory Visit: Payer: Self-pay

## 2018-06-29 ENCOUNTER — Ambulatory Visit (INDEPENDENT_AMBULATORY_CARE_PROVIDER_SITE_OTHER): Payer: Medicare Other

## 2018-06-29 ENCOUNTER — Other Ambulatory Visit: Payer: Medicare Other

## 2018-06-29 VITALS — BP 152/79 | HR 98

## 2018-06-29 DIAGNOSIS — R05 Cough: Secondary | ICD-10-CM

## 2018-06-29 DIAGNOSIS — K7031 Alcoholic cirrhosis of liver with ascites: Secondary | ICD-10-CM | POA: Diagnosis not present

## 2018-06-29 DIAGNOSIS — R296 Repeated falls: Secondary | ICD-10-CM

## 2018-06-29 DIAGNOSIS — F1010 Alcohol abuse, uncomplicated: Secondary | ICD-10-CM | POA: Diagnosis not present

## 2018-06-29 DIAGNOSIS — R058 Other specified cough: Secondary | ICD-10-CM

## 2018-06-29 DIAGNOSIS — R053 Chronic cough: Secondary | ICD-10-CM

## 2018-06-29 DIAGNOSIS — Z20822 Contact with and (suspected) exposure to covid-19: Secondary | ICD-10-CM

## 2018-06-29 MED ORDER — BENZONATATE 200 MG PO CAPS: 200.0000 mg | ORAL_CAPSULE | Freq: Two times a day (BID) | ORAL | 0 refills | Status: DC | PRN

## 2018-06-29 MED ORDER — AMOXICILLIN-POT CLAVULANATE 875-125 MG PO TABS: 1.0000 | ORAL_TABLET | Freq: Two times a day (BID) | ORAL | 0 refills | Status: DC

## 2018-06-29 NOTE — Addendum Note (Signed)
Addended by: Lynnea Ferrier on: 06/29/2018 01:07 PM   Modules accepted: Orders

## 2018-06-29 NOTE — Telephone Encounter (Signed)
Thank you :)

## 2018-06-29 NOTE — Telephone Encounter (Signed)
He is high risk so please send to [facility] pool indicating that he needs testing.  Thank you.

## 2018-06-29 NOTE — Telephone Encounter (Signed)
Dr. Deborra Medina please advise pt can in for xray of his chest and his daughter requested he be sent to get tested for covid-19.

## 2018-06-29 NOTE — Telephone Encounter (Signed)
Pt sent to [facility] pool and is scheduled to be tested 6/19

## 2018-06-29 NOTE — Telephone Encounter (Addendum)
Patient called and his wife answered the phone, advised of the request of covid testing. Appointment scheduled for tomorrow, 06/30/18 at [facility], advised of location and to wear a mask for everyone in the vehicle, she verbalized understanding. She asked for the number to call if they need to cancel, given [phone] between the hours 0700-1900. Order placed.    [place], Ovidio Hanger, LPN  [facility] [employer]        Had Virtual visit with PCP 06/29/2018. Would like pt tested per PCP pt is of high risk.

## 2018-06-30 ENCOUNTER — Other Ambulatory Visit: Payer: Medicare Other

## 2018-06-30 DIAGNOSIS — Z20822 Contact with and (suspected) exposure to covid-19: Secondary | ICD-10-CM

## 2018-06-30 DIAGNOSIS — R6889 Other general symptoms and signs: Secondary | ICD-10-CM | POA: Diagnosis not present

## 2018-07-03 ENCOUNTER — Telehealth: Payer: Self-pay

## 2018-07-03 NOTE — Telephone Encounter (Signed)
Dr. Armanda Magic   Copied from [place] [id number]. Topic: Quick Communication - Rx Refill/Question >> Jul 03, 2018  3:04 PM Erick Blinks wrote: Medication: Called to report that recent meds are working well.

## 2018-07-04 ENCOUNTER — Telehealth: Payer: Self-pay

## 2018-07-04 LAB — NOVEL CORONAVIRUS, NAA: SARS-CoV-2, NAA: NOT DETECTED

## 2018-07-04 NOTE — Telephone Encounter (Signed)
Pt's wife called for results and informed that pt Covid negative. Wife instructed of Covid 19 signs and symptoms to watch out for and to notify ED via 911 for any respiratory distress or difficulty breathing or worening SOB. Wife verbalized understanding.

## 2018-07-23 ENCOUNTER — Other Ambulatory Visit: Payer: Self-pay | Admitting: Family Medicine

## 2018-07-25 ENCOUNTER — Telehealth: Payer: Self-pay | Admitting: Family Medicine

## 2018-07-25 NOTE — Telephone Encounter (Signed)
REFILL benzonatate (TESSALON) 200 MG capsule [Pharmacy Med Name: BENZONATATE 200MG  CAPSULES]   amoxicillin-clavulanate (AUGMENTIN) 875-125 MG tablet [Pharmacy Med Name: AMOX-CLAV 875MG   [place], [place] [phone] (Phone) [phone] (Fax)    Patient states he is still having bad coughing and is having a lot of mucus. Call back # [phone]

## 2018-07-26 NOTE — Telephone Encounter (Signed)
Spoke with pt wife regarding symptoms for need of refills and sent over information for Dr. Deborra Medina to evaluate further.

## 2018-07-26 NOTE — Telephone Encounter (Signed)
Pt wife states that pt has started coughing and having the same symptoms as when he was here back in June. Pt wife states that pt keeps coughing and tries to get something up but nothing comes up.

## 2018-07-26 NOTE — Telephone Encounter (Signed)
Dr. Aron - please advise 

## 2018-07-26 NOTE — Telephone Encounter (Signed)
Please call pt to ask why he is requesting this.

## 2018-07-27 NOTE — Telephone Encounter (Signed)
Pt aware of medication sent.

## 2018-08-23 ENCOUNTER — Telehealth: Payer: Self-pay | Admitting: Family Medicine

## 2018-08-23 NOTE — Telephone Encounter (Signed)
Wife called - she is asking for call back from Dr Deborra Medina. Would not provider any more info.

## 2018-08-24 ENCOUNTER — Encounter: Payer: Self-pay | Admitting: Family Medicine

## 2018-08-31 NOTE — Telephone Encounter (Signed)
I have spoken with her.   Thanks!

## 2018-08-31 NOTE — Telephone Encounter (Signed)
TA-Here is another request I found where wife wants to speak to you/unsure if you have been able to yet/thx dmf

## 2018-09-11 ENCOUNTER — Encounter: Payer: Self-pay | Admitting: Family Medicine

## 2018-09-14 ENCOUNTER — Telehealth: Payer: Self-pay | Admitting: Family Medicine

## 2018-09-14 NOTE — Telephone Encounter (Signed)
Pt wife called and stated that pt is completley out of promethazine (PHENERGAN) 25 MG tablet [id number]. Mail service will have medication to home on 09/21/18 and would like to know if enough could be sent in to local pharmacy until then. Please advise    [facility] [place], [place] [phone] (Phone) [phone] (Fax)

## 2018-09-19 ENCOUNTER — Emergency Department (HOSPITAL_COMMUNITY): Payer: Medicare Other

## 2018-09-19 ENCOUNTER — Emergency Department (HOSPITAL_COMMUNITY)
Admission: EM | Admit: 2018-09-19 | Discharge: 2018-10-12 | Disposition: E | Payer: Medicare Other | Attending: Emergency Medicine | Admitting: Emergency Medicine

## 2018-09-19 ENCOUNTER — Encounter (HOSPITAL_COMMUNITY): Payer: Self-pay | Admitting: Emergency Medicine

## 2018-09-19 DIAGNOSIS — W19XXXA Unspecified fall, initial encounter: Secondary | ICD-10-CM

## 2018-09-19 DIAGNOSIS — Z20828 Contact with and (suspected) exposure to other viral communicable diseases: Secondary | ICD-10-CM | POA: Diagnosis not present

## 2018-09-19 DIAGNOSIS — I469 Cardiac arrest, cause unspecified: Secondary | ICD-10-CM | POA: Diagnosis not present

## 2018-09-19 DIAGNOSIS — R0689 Other abnormalities of breathing: Secondary | ICD-10-CM | POA: Diagnosis not present

## 2018-09-19 DIAGNOSIS — T1490XA Injury, unspecified, initial encounter: Secondary | ICD-10-CM

## 2018-09-19 DIAGNOSIS — R404 Transient alteration of awareness: Secondary | ICD-10-CM | POA: Diagnosis not present

## 2018-09-19 DIAGNOSIS — J9811 Atelectasis: Secondary | ICD-10-CM | POA: Diagnosis not present

## 2018-09-19 DIAGNOSIS — J9601 Acute respiratory failure with hypoxia: Secondary | ICD-10-CM

## 2018-09-19 DIAGNOSIS — R000 Tachycardia, unspecified: Secondary | ICD-10-CM | POA: Diagnosis not present

## 2018-09-19 DIAGNOSIS — I499 Cardiac arrhythmia, unspecified: Secondary | ICD-10-CM | POA: Diagnosis not present

## 2018-09-19 DIAGNOSIS — J90 Pleural effusion, not elsewhere classified: Secondary | ICD-10-CM | POA: Diagnosis not present

## 2018-09-19 DIAGNOSIS — S2242XA Multiple fractures of ribs, left side, initial encounter for closed fracture: Secondary | ICD-10-CM | POA: Diagnosis not present

## 2018-09-19 DIAGNOSIS — S271XXA Traumatic hemothorax, initial encounter: Secondary | ICD-10-CM | POA: Diagnosis not present

## 2018-09-19 DIAGNOSIS — J9600 Acute respiratory failure, unspecified whether with hypoxia or hypercapnia: Secondary | ICD-10-CM | POA: Diagnosis not present

## 2018-09-19 DIAGNOSIS — R4020 Unspecified coma: Secondary | ICD-10-CM | POA: Diagnosis not present

## 2018-09-19 LAB — PREPARE PLATELET PHERESIS: Unit division: 0

## 2018-09-19 LAB — BPAM PLATELET PHERESIS
Blood Product Expiration Date: 202009082359
ISSUE DATE / TIME: 202009081239
Unit Type and Rh: 7300

## 2018-09-19 LAB — COMPREHENSIVE METABOLIC PANEL
ALT: 20 U/L (ref 0–44)
AST: 38 U/L (ref 15–41)
Albumin: 3.6 g/dL (ref 3.5–5.0)
Alkaline Phosphatase: 69 U/L (ref 38–126)
Anion gap: 21 — ABNORMAL HIGH (ref 5–15)
BUN: 34 mg/dL — ABNORMAL HIGH (ref 8–23)
CO2: 18 mmol/L — ABNORMAL LOW (ref 22–32)
Calcium: 8.1 mg/dL — ABNORMAL LOW (ref 8.9–10.3)
Chloride: 81 mmol/L — ABNORMAL LOW (ref 98–111)
Creatinine, Ser: 2.77 mg/dL — ABNORMAL HIGH (ref 0.61–1.24)
GFR calc Af Amer: 25 mL/min — ABNORMAL LOW (ref >60)
GFR calc non Af Amer: 22 mL/min — ABNORMAL LOW (ref >60)
Glucose, Bld: 142 mg/dL — ABNORMAL HIGH (ref 70–99)
Potassium: 4.4 mmol/L (ref 3.5–5.1)
Sodium: 120 mmol/L — ABNORMAL LOW (ref 135–145)
Total Bilirubin: 1 mg/dL (ref 0.3–1.2)
Total Protein: 6.3 g/dL — ABNORMAL LOW (ref 6.5–8.1)

## 2018-09-19 LAB — PREPARE CRYOPRECIPITATE: Unit division: 0

## 2018-09-19 LAB — CBC
HCT: 28.1 % — ABNORMAL LOW (ref 39.0–52.0)
Hemoglobin: 8.9 g/dL — ABNORMAL LOW (ref 13.0–17.0)
MCH: 30.3 pg (ref 26.0–34.0)
MCHC: 31.7 g/dL (ref 30.0–36.0)
MCV: 95.6 fL (ref 80.0–100.0)
Platelets: 125 10*3/uL — ABNORMAL LOW (ref 150–400)
RBC: 2.94 MIL/uL — ABNORMAL LOW (ref 4.22–5.81)
RDW: 13.9 % (ref 11.5–15.5)
WBC: 19.7 10*3/uL — ABNORMAL HIGH (ref 4.0–10.5)
nRBC: 0.5 % — ABNORMAL HIGH (ref 0.0–0.2)

## 2018-09-19 LAB — BPAM CRYOPRECIPITATE
Blood Product Expiration Date: 202009081840
ISSUE DATE / TIME: 202009081301
Unit Type and Rh: 6200

## 2018-09-19 LAB — I-STAT CHEM 8, ED
BUN: 37 mg/dL — ABNORMAL HIGH (ref 8–23)
Calcium, Ion: 1 mmol/L — ABNORMAL LOW (ref 1.15–1.40)
Chloride: 84 mmol/L — ABNORMAL LOW (ref 98–111)
Creatinine, Ser: 2.6 mg/dL — ABNORMAL HIGH (ref 0.61–1.24)
Glucose, Bld: 127 mg/dL — ABNORMAL HIGH (ref 70–99)
HCT: 29 % — ABNORMAL LOW (ref 39.0–52.0)
Hemoglobin: 9.9 g/dL — ABNORMAL LOW (ref 13.0–17.0)
Potassium: 4.3 mmol/L (ref 3.5–5.1)
Sodium: 117 mmol/L — CL (ref 135–145)
TCO2: 22 mmol/L (ref 22–32)

## 2018-09-19 LAB — LACTIC ACID, PLASMA: Lactic Acid, Venous: 7.6 mmol/L (ref 0.5–1.9)

## 2018-09-19 LAB — PROTIME-INR
INR: 1.6 — ABNORMAL HIGH (ref 0.8–1.2)
Prothrombin Time: 18.8 seconds — ABNORMAL HIGH (ref 11.4–15.2)

## 2018-09-19 LAB — SARS CORONAVIRUS 2 BY RT PCR (HOSPITAL ORDER, PERFORMED IN ~~LOC~~ HOSPITAL LAB): SARS Coronavirus 2: NEGATIVE

## 2018-09-19 LAB — TROPONIN I (HIGH SENSITIVITY): Troponin I (High Sensitivity): 31 ng/L — ABNORMAL HIGH (ref <18)

## 2018-09-19 LAB — I-STAT CREATININE, ED: Creatinine, Ser: 2.7 mg/dL — ABNORMAL HIGH (ref 0.61–1.24)

## 2018-09-19 MED ORDER — EPINEPHRINE 1 MG/10ML IJ SOSY
PREFILLED_SYRINGE | INTRAMUSCULAR | Status: AC | PRN
  Administered 2018-09-19: 1 via INTRAVENOUS

## 2018-09-19 MED ORDER — EPINEPHRINE 1 MG/10ML IJ SOSY
PREFILLED_SYRINGE | INTRAMUSCULAR | Status: DC | PRN
  Administered 2018-09-19: 1 via INTRAVENOUS

## 2018-09-19 MED ORDER — EPINEPHRINE HCL 5 MG/250ML IV SOLN IN NS
0.5000 ug/min | INTRAVENOUS | Status: DC
  Filled 2018-09-19: qty 250

## 2018-09-19 MED ORDER — SODIUM CHLORIDE 0.9 % IV BOLUS: 1000.0000 mL | Freq: Once | INTRAVENOUS | Status: DC

## 2018-09-19 MED ORDER — SODIUM CHLORIDE 0.9% FLUSH: 3.0000 mL | Freq: Once | INTRAVENOUS | Status: DC

## 2018-09-19 MED ORDER — CALCIUM CHLORIDE 10 % IV SOLN
INTRAVENOUS | Status: DC | PRN
  Administered 2018-09-19: 1 g via INTRAVENOUS

## 2018-09-19 MED ORDER — SODIUM BICARBONATE 8.4 % IV SOLN
INTRAVENOUS | Status: DC | PRN
  Administered 2018-09-19: 100 meq via INTRAVENOUS

## 2018-09-19 MED FILL — Medication: Qty: 1 | Status: AC

## 2018-09-20 LAB — BPAM FFP
Blood Product Expiration Date: 202009082359
Blood Product Expiration Date: 202009082359
Blood Product Expiration Date: 202009092359
Blood Product Expiration Date: 202009132359
Blood Product Expiration Date: 202009132359
Blood Product Expiration Date: 202009212359
Blood Product Expiration Date: 202009232359
Blood Product Expiration Date: 202009232359
Blood Product Expiration Date: 202009252359
Blood Product Expiration Date: 202009252359
ISSUE DATE / TIME: 202009081158
ISSUE DATE / TIME: 202009081158
ISSUE DATE / TIME: 202009081230
ISSUE DATE / TIME: 202009081230
ISSUE DATE / TIME: 202009081237
ISSUE DATE / TIME: 202009081826
ISSUE DATE / TIME: 202009081833
ISSUE DATE / TIME: 202009081911
ISSUE DATE / TIME: 202009081911
ISSUE DATE / TIME: 202009081911
Unit Type and Rh: 600
Unit Type and Rh: 6200
Unit Type and Rh: 6200
Unit Type and Rh: 6200
Unit Type and Rh: 6200
Unit Type and Rh: 6200
Unit Type and Rh: 6200
Unit Type and Rh: 6200
Unit Type and Rh: 6200
Unit Type and Rh: 6200

## 2018-09-20 LAB — PREPARE FRESH FROZEN PLASMA
Unit division: 0
Unit division: 0
Unit division: 0
Unit division: 0
Unit division: 0
Unit division: 0
Unit division: 0
Unit division: 0
Unit division: 0
Unit division: 0

## 2018-09-21 ENCOUNTER — Telehealth: Payer: Self-pay | Admitting: Family Medicine

## 2018-09-21 LAB — BPAM RBC
Blood Product Expiration Date: 202009222359
Blood Product Expiration Date: 202009242359
Blood Product Expiration Date: 202010022359
Blood Product Expiration Date: 202010022359
Blood Product Expiration Date: 202010022359
Blood Product Expiration Date: 202010022359
Blood Product Expiration Date: 202010022359
Blood Product Expiration Date: 202010122359
Blood Product Expiration Date: 202010122359
Blood Product Expiration Date: 202010122359
ISSUE DATE / TIME: 202009081155
ISSUE DATE / TIME: 202009081155
ISSUE DATE / TIME: 202009081229
ISSUE DATE / TIME: 202009081229
ISSUE DATE / TIME: 202009081235
ISSUE DATE / TIME: 202009081628
ISSUE DATE / TIME: 202009090942
ISSUE DATE / TIME: 202009091243
ISSUE DATE / TIME: 202009091417
Unit Type and Rh: 5100
Unit Type and Rh: 5100
Unit Type and Rh: 5100
Unit Type and Rh: 5100
Unit Type and Rh: 6200
Unit Type and Rh: 6200
Unit Type and Rh: 6200
Unit Type and Rh: 6200
Unit Type and Rh: 6200
Unit Type and Rh: 6200

## 2018-09-21 LAB — TYPE AND SCREEN
ABO/RH(D): A POS
Antibody Screen: NEGATIVE
Unit division: 0
Unit division: 0
Unit division: 0
Unit division: 0
Unit division: 0
Unit division: 0
Unit division: 0
Unit division: 0
Unit division: 0
Unit division: 0

## 2018-09-21 NOTE — Telephone Encounter (Signed)
Janett Billow from [facility] called to check status of request and needs Pt last office notes from his most recent visit/ please advise    Fax# [phone]

## 2018-09-22 ENCOUNTER — Encounter: Payer: Self-pay | Admitting: Family Medicine

## 2018-09-22 NOTE — Telephone Encounter (Signed)
Patient is deceased.

## 2018-09-22 NOTE — Telephone Encounter (Signed)
[facility] advised pt is deceased.  Nothing further needed.

## 2018-09-24 LAB — CULTURE, BLOOD (ROUTINE X 2)
Culture: NO GROWTH
Culture: NO GROWTH
Special Requests: ADEQUATE
Special Requests: ADEQUATE

## 2018-10-03 ENCOUNTER — Telehealth: Payer: Self-pay | Admitting: Family Medicine

## 2018-10-03 NOTE — Telephone Encounter (Signed)
Best number [phone]  [name] (spouse ) called pt passed 9/8 and she is trying to clean up some bills she found   Pt saw you on 10/21/17.  Medicare denied ems claim.  [name] is wanted to know if you could right a letter stated pt needed to be transported by ems that pt could not drive himself so she can send this to medicare for them to pay claim

## 2018-10-04 ENCOUNTER — Encounter: Payer: Self-pay | Admitting: Family Medicine

## 2018-10-04 NOTE — Telephone Encounter (Signed)
Letter written and placed up front. Please call patient's wife and tell her that I am sorry to hear of Mr. Surette passing. Let her know that the letter is available to be picked up.

## 2018-10-04 NOTE — Telephone Encounter (Signed)
I called & gave patient's wife our condolences. She was informed that letter was upfront in office for pick up. She will come by Monday when she is scheduled to get flu shot.

## 2018-10-12 NOTE — Procedures (Signed)
FAST  Pre-procedure diagnosis: Cardiac arrest after remote fall Post-procedure diagnosis: Same, large left hemothorax visible, no intra-abdominal free fluid seen but left upper quadrant view was poor Procedure: FAST Surgeon: Georganna Skeans, MD Procedure in detail: The patient's abdomen was imaged in 4 regions with the ultrasound. First, the right upper quadrant was imaged. No free fluid was seen between the right kidney and the liver in Morison's pouch. Next, the epigastrium was imaged. No significant pericardial effusion was seen. Next, the left upper quadrant was imaged. No obvious free fluid was seen next to the spleen that the image was poor.  Large left hemothorax visible above the diaphragm.. Finally, the bladder was imaged. No free fluid was seen next to the bladder in the pelvis. Impression: Equivocal left upper quadrant, large left hemothorax  Georganna Skeans, MD, MPH, FACS Trauma: [phone] General Surgery: [phone]

## 2018-10-12 NOTE — Code Documentation (Signed)
Pulse check-- pulses present - placed on zoll pads, LUCAS removed, intubated with 7.5 ETT per dr tegeler- tube placement verified by CXR and condensation in tube--  22g IV in Right Hand Epi gtt started at 5 mcg/min

## 2018-10-12 NOTE — Progress Notes (Signed)
   09-20-2018 1450  Clinical Encounter Type  Visited With Patient and family together  Visit Type Initial;Death  Referral From Nurse  Consult/Referral To Chaplain  Spiritual Encounters  Spiritual Needs Grief support;Emotional  This chaplain responded to page for Pt. death in ED-Trauma B.  The chaplain met the Pt. wife-[name] in Consult A.  The chaplain listened as Burman Nieves described her life with the Pt.  The chaplain was pastorally present with [name] and the Pt. son-Michael when he arrived.  The chaplain shared the Patient Placement Card with the Pt. wife.  The chaplain joined the family as they prepared to exit the hospital. The family clarified the medical examiner process with RN-Karen.  The chaplain is available for F/U spiritual care as needed.

## 2018-10-12 NOTE — Consult Note (Addendum)
Reason for Consult: Left hemothorax with shock Referring Physician: Gerald Stabs Tegeler  [name] is an 73 y.o. male.  HPI: Patient has a history of alcohol abuse and has been drinking heavily for the past 3 days according to family.  He had a fall 1940 8 hours ago and was seen in the emergency department and released.  He was found down by family members today and was brought in as a cardiac arrest.  He underwent CPR for approximately 1 hour.  After arrival at the emergency department, he was intubated by the EDP. He had ROSC after 60 min CPR.  A chest x-ray revealed large left hemothorax and left rib fractures.  He was immediately activated as a level 1 trauma.  He was given blood.  On my arrival he remained hypotensive.  I immediately placed a left sided chest tube and a left-sided large bore triple-lumen subclavian vein central line.  See below  Past Medical History:  Diagnosis Date  . Adenomatous polyp of colon 2006  . 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 Anemia 2012  . Anxiety   . Arthritis    "left arm/shoulder" (05/17/2016)  . Atrial tachycardia, paroxysmal ([place]) 04/07/2015  . BPH (benign prostatic hypertrophy) 5/99  . Chronic bronchitis ([place])    "q year or q other year" (11/02/2011)  . Cirrhosis of liver ([place])   . DCM (dilated cardiomyopathy) ([place])    EF 45-50% by echo and cath 2017  . Depression    Hx of  . Falls frequently    "daily lately; legs just give out" (11/02/2011; 05/17/2016)  . GERD (gastroesophageal reflux disease) 07/31/04   gastritis   . H/O hiatal hernia   . Hard of hearing   . Heart murmur   . Hernia, umbilical    "unrepaired" (11/02/2011; 05/17/2016)  . History of blood transfusion 2012  . HLD (hyperlipidemia) 5/99  . HTN (hypertension) 5/99  . Mild CAD    10% RCA  . PAC (premature atrial contraction) 04/07/2015  . Peripheral vascular disease ([place])   . Pneumonia 1996   hosp  . PONV (postoperative nausea and vomiting)   . Seasonal allergies     pollen / ragweed  . Wears glasses     Past Surgical History:  Procedure Laterality Date  . BACK SURGERY    . CARDIAC CATHETERIZATION N/A 05/23/2015   Procedure: Left Heart Cath and Coronary Angiography;  Surgeon: Troy Sine, MD;  Location: [facility];  Service: Cardiovascular;  Laterality: N/A;  . CATARACT EXTRACTION W/ INTRAOCULAR LENS  IMPLANT, BILATERAL Bilateral 10/2002  . COLONOSCOPY  07/31/04   multiple polyps; repeat in 3 years  . DUPUYTREN CONTRACTURE RELEASE Left   . ETT myoview  03/09/07   nml EF 66%  . FRACTURE SURGERY    . HEMORRHOID SURGERY     "cauterized a long time ago" (11/02/2011)  . hosp CP R/O'D 2/24  03/08/07  . neck MRI  6/04   C5-6 disc abnormality  . ORIF ELBOW FRACTURE Left 10/27/2017   Procedure: OPEN REDUCTION INTERNAL FIXATION (ORIF) LEFT OLECRANON;  Surgeon: Leandrew Koyanagi, MD;  Location: [facility];  Service: Orthopedics;  Laterality: Left;  . ORIF HUMERUS FRACTURE  11/04/2011   Procedure: OPEN REDUCTION INTERNAL FIXATION (ORIF) PROXIMAL HUMERUS FRACTURE;  Surgeon: Rozanna Box, MD;  Location: [facility];  Service: Orthopedics;  Laterality: Left;  . POSTERIOR CERVICAL FUSION/FORAMINOTOMY N/A 03/21/2012   Procedure: POSTERIOR CERVICAL FUSION/FORAMINOTOMY LEVEL ONE;  Surgeon: Cooper Render  Pool, MD;  Location: [facility];  Service: Neurosurgery;  Laterality: N/A;  POSTERIOR CERVICAL ONE TO CERVICAL TWO FUSION WITH ILIAC CREST GRAFT AND LATERAL MASS SCREWS   . TONSILLECTOMY     "I was young" (11/02/2011)  . TOTAL HIP ARTHROPLASTY Left 10/24/2016   Procedure: TOTAL HIP ARTHROPLASTY ANTERIOR APPROACH;  Surgeon: Rod Can, MD;  Location: [facility];  Service: Orthopedics;  Laterality: Left;  . UPPER GASTROINTESTINAL ENDOSCOPY      Family History  Problem Relation Age of Onset  . Heart disease Father   . Diabetes Father   . Stroke Father   . Depression Mother   . Heart disease Brother   . Alcohol abuse Brother   . Liver disease Brother     Social  History:  reports that he has quit smoking. His smoking use included pipe. He has never used smokeless tobacco. He reports previous alcohol use of about 28.0 standard drinks of alcohol per week. He reports that he does not use drugs.  Allergies:  Allergies  Allergen Reactions  . Nifedipine Swelling    11/02/2011 pt denies this allergy (??)  . Bee Venom Swelling    Medications: .   Results for orders placed or performed during the hospital encounter of 01-Oct-2018 (from the past 48 hour(s))  Lactic acid, plasma     Status: Abnormal   Collection Time: 2018-10-01 11:32 AM  Result Value Ref Range   Lactic Acid, Venous 7.6 (HH) 0.5 - 1.9 mmol/L    Comment: CRITICAL RESULT CALLED TO, READ BACK BY AND VERIFIED WITH: Danise Mina RN [id number] BY A BENNETT Performed at [facility], [place] [address], [place], [place] [address]   CBC     Status: Abnormal   Collection Time: 01-Oct-2018 11:33 AM  Result Value Ref Range   WBC 19.7 (H) 4.0 - 10.5 K/uL   RBC 2.94 (L) 4.22 - 5.81 MIL/uL   Hemoglobin 8.9 (L) 13.0 - 17.0 g/dL   HCT 28.1 (L) 39.0 - 52.0 %   MCV 95.6 80.0 - 100.0 fL   MCH 30.3 26.0 - 34.0 pg   MCHC 31.7 30.0 - 36.0 g/dL   RDW 13.9 11.5 - 15.5 %   Platelets 125 (L) 150 - 400 K/uL    Comment: REPEATED TO VERIFY Immature Platelet Fraction may be clinically indicated, consider ordering this additional test [id number]    nRBC 0.5 (H) 0.0 - 0.2 %    Comment: Performed at [facility], [place] [address], [place], [place] [address]  Troponin I (High Sensitivity)     Status: Abnormal   Collection Time: Oct 01, 2018 11:33 AM  Result Value Ref Range   Troponin I (High Sensitivity) 31 (H) <18 ng/L    Comment: (NOTE) Elevated high sensitivity troponin I (hsTnI) values and significant  changes across serial measurements may suggest ACS but many other  chronic and acute conditions are known to elevate hsTnI results.  Refer to the Links section for chest pain algorithms and additional   guidance. Performed at [facility], [place] [address], [place], [place] [address]   Protime-INR (order if Patient is taking Coumadin / Warfarin)     Status: Abnormal   Collection Time: Oct 01, 2018 11:33 AM  Result Value Ref Range   Prothrombin Time 18.8 (H) 11.4 - 15.2 seconds   INR 1.6 (H) 0.8 - 1.2    Comment: (NOTE) INR goal varies based on device and disease states. Performed at [facility],  [address], [place], [place] [address]   Comprehensive metabolic panel     Status: Abnormal   Collection Time: 07-Oct-2018 11:33 AM  Result Value Ref Range   Sodium 120 (L) 135 - 145 mmol/L   Potassium 4.4 3.5 - 5.1 mmol/L   Chloride 81 (L) 98 - 111 mmol/L   CO2 18 (L) 22 - 32 mmol/L   Glucose, Bld 142 (H) 70 - 99 mg/dL   BUN 34 (H) 8 - 23 mg/dL   Creatinine, Ser 2.77 (H) 0.61 - 1.24 mg/dL   Calcium 8.1 (L) 8.9 - 10.3 mg/dL   Total Protein 6.3 (L) 6.5 - 8.1 g/dL   Albumin 3.6 3.5 - 5.0 g/dL   AST 38 15 - 41 U/L   ALT 20 0 - 44 U/L   Alkaline Phosphatase 69 38 - 126 U/L   Total Bilirubin 1.0 0.3 - 1.2 mg/dL   GFR calc non Af Amer 22 (L) >60 mL/min   GFR calc Af Amer 25 (L) >60 mL/min   Anion gap 21 (H) 5 - 15    Comment: Performed at [facility], [place] [address], [place], [place] [address]  SARS Coronavirus 2 [facility] order, Performed in [facility] hospital lab) Nasopharyngeal Nasopharyngeal Swab     Status: None   Collection Time: 2018-10-07 11:33 AM   Specimen: Nasopharyngeal Swab  Result Value Ref Range   SARS Coronavirus 2 NEGATIVE NEGATIVE    Comment: (NOTE) If result is NEGATIVE SARS-CoV-2 target nucleic acids are NOT DETECTED. The SARS-CoV-2 RNA is generally detectable in upper and lower  respiratory specimens during the acute phase of infection. The lowest  concentration of SARS-CoV-2 viral copies this assay can detect is 250  copies / mL. A negative result does not preclude SARS-CoV-2 infection  and should not be used as the sole basis for treatment or  other  patient management decisions.  A negative result may occur with  improper specimen collection / handling, submission of specimen other  than nasopharyngeal swab, presence of viral mutation(s) within the  areas targeted by this assay, and inadequate number of viral copies  (<250 copies / mL). A negative result must be combined with clinical  observations, patient history, and epidemiological information. If result is POSITIVE SARS-CoV-2 target nucleic acids are DETECTED. The SARS-CoV-2 RNA is generally detectable in upper and lower  respiratory specimens dur ing the acute phase of infection.  Positive  results are indicative of active infection with SARS-CoV-2.  Clinical  correlation with patient history and other diagnostic information is  necessary to determine patient infection status.  Positive results do  not rule out bacterial infection or co-infection with other viruses. If result is PRESUMPTIVE POSTIVE SARS-CoV-2 nucleic acids MAY BE PRESENT.   A presumptive positive result was obtained on the submitted specimen  and confirmed on repeat testing.  While 2019 novel coronavirus  (SARS-CoV-2) nucleic acids may be present in the submitted sample  additional confirmatory testing may be necessary for epidemiological  and / or clinical management purposes  to differentiate between  SARS-CoV-2 and other Sarbecovirus currently known to infect humans.  If clinically indicated additional testing with an alternate test  methodology [phone]) is advised. The SARS-CoV-2 RNA is generally  detectable in upper and lower respiratory sp ecimens during the acute  phase of infection. The expected result is Negative. Fact Sheet for Patients:  StrictlyIdeas.no Fact Sheet for Healthcare Providers: BankingDealers.co.za This test is not yet approved or cleared by the [place]  States FDA and has been authorized for detection and/or diagnosis of  SARS-CoV-2 by FDA under an Emergency Use Authorization (EUA).  This EUA will remain in effect (meaning this test can be used) for the duration of the COVID-19 declaration under Section 564(b)(1) of the Act, 21 U.S.C. section 360bbb-3(b)(1), unless the authorization is terminated or revoked sooner. Performed at [facility], [place] [address], [place], [place] [address]   Type and screen [place]     Status: None (Preliminary result)   Collection Time: September 20, 2018 11:34 AM  Result Value Ref Range   ABO/RH(D) A POS    Antibody Screen NEG    Sample Expiration 09/22/2018,2359    Unit Number [id number]    Blood Component Type RED CELLS,LR    Unit division 00    Status of Unit ISSUED    Transfusion Status PENDING    Crossmatch Result PENDING    Unit Number [id number]    Blood Component Type RED CELLS,LR    Unit division 00    Status of Unit ISSUED    Transfusion Status PENDING    Crossmatch Result PENDING    Unit Number [id number]    Blood Component Type RED CELLS,LR    Unit division 00    Status of Unit ISSUED    Unit tag comment Solstice Lastinger    Transfusion Status OK TO TRANSFUSE    Crossmatch Result PENDING    Unit Number [id number]    Blood Component Type RBC, LR IRR    Unit division 00    Status of Unit ISSUED    Unit tag comment Cherae Marton    Transfusion Status OK TO TRANSFUSE    Crossmatch Result PENDING    Unit Number [id number]    Blood Component Type RED CELLS,LR    Unit division 00    Status of Unit ISSUED    Unit tag comment VERBAL ORDERS PER DR Katee Wentland    Transfusion Status OK TO TRANSFUSE    Crossmatch Result PENDING    Unit Number [id number]    Blood Component Type RBC, LR IRR    Unit division 00    Status of Unit ISSUED    Unit tag comment VERBAL ORDERS PER DR Fadil Macmaster    Transfusion Status OK TO TRANSFUSE    Crossmatch Result PENDING    Unit Number [id number]    Blood Component Type RED CELLS,LR    Unit division 00     Status of Unit ISSUED    Transfusion Status OK TO TRANSFUSE    Crossmatch Result      Compatible Performed at [facility], [place] [address], [place], [place] [address]    Unit Number [id number]    Blood Component Type RED CELLS,LR    Unit division 00    Status of Unit ALLOCATED    Transfusion Status OK TO TRANSFUSE    Crossmatch Result Compatible    Unit Number [id number]    Blood Component Type RBC LR PHER2    Unit division 00    Status of Unit ISSUED    Transfusion Status OK TO TRANSFUSE    Crossmatch Result Compatible    Unit Number [id number]    Blood Component Type RED CELLS,LR    Unit division 00    Status of Unit ALLOCATED    Transfusion Status OK TO TRANSFUSE    Crossmatch Result Compatible   I-Stat Creatinine, ED (not at [facility])     Status: Abnormal   Collection Time:  10-11-2018 11:44 AM  Result Value Ref Range   Creatinine, Ser 2.70 (H) 0.61 - 1.24 mg/dL  I-stat chem 8, ED (not at [facility] or [facility])     Status: Abnormal   Collection Time: 10/11/2018 11:44 AM  Result Value Ref Range   Sodium 117 (LL) 135 - 145 mmol/L   Potassium 4.3 3.5 - 5.1 mmol/L   Chloride 84 (L) 98 - 111 mmol/L   BUN 37 (H) 8 - 23 mg/dL   Creatinine, Ser 2.60 (H) 0.61 - 1.24 mg/dL   Glucose, Bld 127 (H) 70 - 99 mg/dL   Calcium, Ion 1.00 (L) 1.15 - 1.40 mmol/L   TCO2 22 22 - 32 mmol/L   Hemoglobin 9.9 (L) 13.0 - 17.0 g/dL   HCT 29.0 (L) 39.0 - 52.0 %   Comment NOTIFIED PHYSICIAN   Prepare fresh frozen plasma     Status: None (Preliminary result)   Collection Time: 2018/10/11 11:57 AM  Result Value Ref Range   Unit Number [id number]    Blood Component Type THAWED PLASMA    Unit division 00    Status of Unit ISSUED    Unit tag comment EMERGENCY RELEASE    Transfusion Status OK TO TRANSFUSE    Unit Number [id number]    Blood Component Type THAWED PLASMA    Unit division 00    Status of Unit ISSUED    Unit tag comment EMERGENCY RELEASE    Transfusion Status OK TO TRANSFUSE     Unit Number [id number]    Blood Component Type LIQ PLASMA    Unit division 00    Status of Unit ISSUED    Unit tag comment VERBAL ORDERS PER DR Thamas Appleyard    Transfusion Status OK TO TRANSFUSE    Unit Number [id number]    Blood Component Type THAWED PLASMA    Unit division 00    Status of Unit ISSUED    Unit tag comment VERBAL ORDERS PER DR Suvi Archuletta    Transfusion Status OK TO TRANSFUSE    Unit Number [id number]    Blood Component Type LIQ PLASMA    Unit division 00    Status of Unit ISSUED    Unit tag comment VERBAL ORDERS PER DR Glendia Olshefski    Transfusion Status OK TO TRANSFUSE    Unit Number [id number]    Blood Component Type LIQ PLASMA    Unit division 00    Status of Unit ISSUED    Unit tag comment VERBAL ORDERS PER DR Vickki Igou    Transfusion Status OK TO TRANSFUSE    Unit Number [id number]    Blood Component Type LIQ PLASMA    Unit division 00    Status of Unit ISSUED    Transfusion Status OK TO TRANSFUSE    Unit Number [id number]    Blood Component Type LIQ PLASMA    Unit division 00    Status of Unit ISSUED    Transfusion Status OK TO TRANSFUSE    Unit Number [id number]    Blood Component Type THAWED PLASMA    Unit division 00    Status of Unit ISSUED    Transfusion Status OK TO TRANSFUSE    Unit Number [id number]    Blood Component Type THAWED PLASMA    Unit division 00    Status of Unit ISSUED    Transfusion Status      OK TO TRANSFUSE Performed at [facility], [address], [place], [place] [address]   Prepare cryoprecipitate  Status: None (Preliminary result)   Collection Time: 10-03-2018 12:00 PM  Result Value Ref Range   Unit Number [id number]    Blood Component Type CRYPOOL THAW    Unit division 00    Status of Unit ISSUED    Transfusion Status      OK TO TRANSFUSE Performed at [facility] [address] [place], [place] [address]   Prepare platelet pheresis     Status: None (Preliminary result)    Collection Time: October 03, 2018 12:30 PM  Result Value Ref Range   Unit Number [id number]    Blood Component Type PLTP LR2 PAS    Unit division 00    Status of Unit ISSUED    Transfusion Status      OK TO TRANSFUSE Performed at [facility] [address] [place], [place] [address]     Dg Pelvis Portable  Result Date: 2018/10/03 CLINICAL DATA:  Cardiac arrest. EXAM: PORTABLE PELVIS 1-2 VIEWS COMPARISON:  Radiographs October 24, 2016. FINDINGS: There is no evidence of pelvic fracture or diastasis. No pelvic bone lesions are seen. Status post left total hip arthroplasty. IMPRESSION: No acute abnormality seen in the pelvis. Electronically Signed   By: Marijo Conception M.D.   On: 03-Oct-2018 12:16   Dg Chest Portable 1 View  Result Date: 10-03-18 CLINICAL DATA:  Cardiac arrest. EXAM: PORTABLE CHEST 1 VIEW COMPARISON:  Radiograph of June 29, 2018. FINDINGS: Mild cardiomegaly is noted. Nasogastric tube is seen entering stomach. Endotracheal tube is seen projected over tracheal air shadow, but approximately 12 cm above the carina; advancement is recommended. Old left rib fractures are noted. No pneumothorax is noted. Right lung is clear. Mild left basilar atelectasis is noted with small left pleural effusion. IMPRESSION: Endotracheal tube tip is 12 cm above the carina; advancement is recommended. Nasogastric tube appears to be in grossly good position. Mild left basilar atelectasis is noted with small left pleural effusion. No definite pneumothorax is noted. Old left rib fractures are noted. Electronically Signed   By: Marijo Conception M.D.   On: 10/03/2018 12:15    Review of Systems  Reason unable to perform ROS: intubated.   Height 5\' 10"  (1.778 m), weight 89.5 kg. Physical Exam  Constitutional: He appears well-developed.  HENT:  Head: Normocephalic.  Eyes:  Pupils fixed and dilated  Neck: No tracheal deviation present. No thyromegaly present.  Cardiovascular:  Faint cardiac rhythm,  regular, distal pulses thready  Respiratory:  Ventilated, large lateral left lower chest contusion and hematoma extending down the flank  GI: Soft.  Reducible umbilical hernia, no appreciable tenderness, large left flank contusion and hematoma as above  Musculoskeletal:        General: No deformity.  Neurological: GCS eye subscore is 1. GCS verbal subscore is 1. GCS motor subscore is 1.  Pupils fixed and dilated    Assessment/Plan: Cardiac arrest 48 hours after fall.  Large left hemothorax.  After central line and chest tube were placed, he continued on an epinephrine drip which was started by the EDP and blood resuscitation continued including MTP.  He lost pulses and we resumed CPR for 2 minutes while he got epinephrine, calcium, and bicarb.  He briefly had return of spontaneous circulation.  We were preparing to go to the CAT scanner for further evaluation when he lost pulses again.  Time of death 25.  EDP notified family that told him at that time that he was a DNR.  I am not  sure why that was not communicated to EMS when they picked him up at the house.  Critical care 49min. Zenovia Jarred October 02, 2018, 1:05 PM

## 2018-10-12 NOTE — Progress Notes (Signed)
Responded to ED page. Requested to sit with patient's family. Family has yet to arrive. Will be available for spiritual care as needed.

## 2018-10-12 NOTE — ED Provider Notes (Signed)
[facility] EMERGENCY DEPARTMENT Provider Note   CSN: [id number] Arrival date & time: 09-30-2018  1118     History   Chief Complaint Chief Complaint  Patient presents with  . Cardiac Arrest    HPI [name] is a 73 y.o. male.     The history is provided by the EMS personnel, medical records and the spouse. No language interpreter was used.  Cardiac Arrest Witnessed by:  Family member Incident location:  Home Time since incident:  1 hour Time before BLS initiated:  Immediate Condition upon EMS arrival:  Agonal respirations, apneic and unresponsive Pulse:  Absent Initial cardiac rhythm per EMS:  PEA Treatments prior to arrival:  ACLS protocol Medications given prior to ED:  Epinephrine Airway:  Intubation in ED Rhythm on admission to ED:  Normal sinus Risk factors: trauma     Past Medical History:  Diagnosis Date  . Adenomatous polyp of colon 2006  . Alcohol-induced persisting dementia ([place])   . Anemia 2012  . Anxiety   . Arthritis    "left arm/shoulder" (05/17/2016)  . Atrial tachycardia, paroxysmal ([place]) 04/07/2015  . BPH (benign prostatic hypertrophy) 5/99  . Chronic bronchitis ([place])    "q year or q other year" (11/02/2011)  . Cirrhosis of liver ([place])   . DCM (dilated cardiomyopathy) ([place])    EF 45-50% by echo and cath 2017  . Depression    Hx of  . Falls frequently    "daily lately; legs just give out" (11/02/2011; 05/17/2016)  . GERD (gastroesophageal reflux disease) 07/31/04   gastritis   . H/O hiatal hernia   . Hard of hearing   . Heart murmur   . Hernia, umbilical    "unrepaired" (11/02/2011; 05/17/2016)  . History of blood transfusion 2012  . HLD (hyperlipidemia) 5/99  . HTN (hypertension) 5/99  . Mild CAD    10% RCA  . PAC (premature atrial contraction) 04/07/2015  . Peripheral vascular disease ([place])   . Pneumonia 1996   hosp  . PONV (postoperative nausea and vomiting)   . Seasonal allergies    pollen / ragweed  . Wears  glasses     Patient Active Problem List   Diagnosis Date Noted  . Persistent cough for 3 weeks or longer 06/28/2018  . Cough present for greater than 3 weeks 06/13/2018  . Chronic hyponatremia 02/15/2018  . Driving safety issue 02/15/2018  . Anasarca   . DOE (dyspnea on exertion)   . Acute metabolic encephalopathy 0000000  . Hypoglycemia 01/17/2018  . GERD (gastroesophageal reflux disease) 01/17/2018  . SOB (shortness of breath) 01/17/2018  . Encephalopathy   . Palliative care by specialist   . Goals of care, counseling/discussion   . Endotracheally intubated   . History of ETT   . Acute respiratory failure with hypoxemia ([place])   . Acute respiratory insufficiency   . Closed fracture of left olecranon process 10/27/2017  . Fracture of left olecranon process, closed, initial encounter 10/27/2017  . Frequent falls 06/29/2017  . Fall 04/09/2017  . Scalp laceration   . Hypomagnesemia 10/25/2016  . Hip fracture ([place]) 10/24/2016  . Displaced fracture of left femoral neck ([place]) 10/24/2016  . Thrombocytopenia ([place]) 10/24/2016  . Closed displaced fracture of left femoral neck ([place]) 10/23/2016  . Contracture of muscle of left hand 09/15/2016  . Encounter for nasogastric tube placement 07/08/2016  . Infected laceration 07/08/2016  . Generalized muscle weakness 07/08/2016  . Anemia 05/18/2016  . Syncope and collapse   .  Syncope 05/17/2016  . Rib fractures 05/17/2016  . Bullous pemphigoid 05/17/2016  . ETOH abuse 03/17/2016  . Dizziness and giddiness 12/09/2015  . Chronic pain syndrome 12/09/2015  . Arthritis 11/20/2015  . Mild CAD   . DCM (dilated cardiomyopathy) ([place])   . Abnormal nuclear stress test 05/07/2015  . PAC (premature atrial contraction) 04/07/2015  . Atrial tachycardia, paroxysmal ([place]) 03/10/2015  . Depression 04/12/2012  . C2 cervical fracture ([place]) 02/29/2012  . Alcohol withdrawal ([place]) 11/06/2011  . Fracture of humerus, proximal, left, closed 11/02/2011  .  Hyponatremia 11/17/2010  . H/O: upper GI bleed 10/20/2010  . Stomach ulcer from aspirin/ibuprofen-like drugs (NSAID's) 10/20/2010  . Colon polyp 08/26/2010  . Diverticulosis of colon (without mention of hemorrhage) 08/26/2010  . BPH (benign prostatic hyperplasia) 07/02/2010  . Deficiency anemia 03/09/2010  . Hyposmolality and/or hyponatremia 10/17/2009  . ALCOHOLIC CIRRHOSIS OF LIVER 07/25/2009  . Hypokalemia 04/03/2007  . Anxiety state 01/24/2007  . HLD (hyperlipidemia) 07/28/2006  . Alcohol abuse 07/28/2006  . Essential hypertension 07/28/2006  . IMPOTENCE, ORGANIC ORIGIN 07/28/2006    Past Surgical History:  Procedure Laterality Date  . BACK SURGERY    . CARDIAC CATHETERIZATION N/A 05/23/2015   Procedure: Left Heart Cath and Coronary Angiography;  Surgeon: Troy Sine, MD;  Location: [facility];  Service: Cardiovascular;  Laterality: N/A;  . CATARACT EXTRACTION W/ INTRAOCULAR LENS  IMPLANT, BILATERAL Bilateral 10/2002  . COLONOSCOPY  07/31/04   multiple polyps; repeat in 3 years  . DUPUYTREN CONTRACTURE RELEASE Left   . ETT myoview  03/09/07   nml EF 66%  . FRACTURE SURGERY    . HEMORRHOID SURGERY     "cauterized a long time ago" (11/02/2011)  . hosp CP R/O'D 2/24  03/08/07  . neck MRI  6/04   C5-6 disc abnormality  . ORIF ELBOW FRACTURE Left 10/27/2017   Procedure: OPEN REDUCTION INTERNAL FIXATION (ORIF) LEFT OLECRANON;  Surgeon: Leandrew Koyanagi, MD;  Location: [facility];  Service: Orthopedics;  Laterality: Left;  . ORIF HUMERUS FRACTURE  11/04/2011   Procedure: OPEN REDUCTION INTERNAL FIXATION (ORIF) PROXIMAL HUMERUS FRACTURE;  Surgeon: Rozanna Box, MD;  Location: [facility];  Service: Orthopedics;  Laterality: Left;  . POSTERIOR CERVICAL FUSION/FORAMINOTOMY N/A 03/21/2012   Procedure: POSTERIOR CERVICAL FUSION/FORAMINOTOMY LEVEL ONE;  Surgeon: Charlie Pitter, MD;  Location: [facility];  Service: Neurosurgery;  Laterality: N/A;  POSTERIOR CERVICAL ONE TO CERVICAL TWO FUSION  WITH ILIAC CREST GRAFT AND LATERAL MASS SCREWS   . TONSILLECTOMY     "I was young" (11/02/2011)  . TOTAL HIP ARTHROPLASTY Left 10/24/2016   Procedure: TOTAL HIP ARTHROPLASTY ANTERIOR APPROACH;  Surgeon: Rod Can, MD;  Location: [facility];  Service: Orthopedics;  Laterality: Left;  . UPPER GASTROINTESTINAL ENDOSCOPY          Home Medications    Prior to Admission medications   Medication Sig Start Date End Date Taking? Authorizing Provider  amoxicillin-clavulanate (AUGMENTIN) 875-125 MG tablet TAKE 1 TABLET BY MOUTH TWICE DAILY FOR 10 DAYS 07/26/18   Lucille Passy, MD  aspirin EC 81 MG tablet Take 81 mg by mouth daily.    [provider]  benzonatate (TESSALON) 200 MG capsule TAKE 1 CAPSULE(200 MG) BY MOUTH TWICE DAILY AS NEEDED FOR COUGH 07/26/18   Lucille Passy, MD  cetirizine (ZYRTEC) 10 MG tablet Take 1 tablet (10 mg total) by mouth daily. 06/13/18   Lucille Passy, MD  Cyanocobalamin (VITAMIN B-12 PO) Take  1 tablet by mouth daily.    [provider]  Elastic Bandages & Supports (LUMBAR BACK BRACE/SUPPORT PAD) MISC 1 Units by Does not apply route daily as needed. 01/25/18   Elby Beck, FNP  EPINEPHrine (EPIPEN 2-PAK) 0.3 mg/0.3 mL IJ SOAJ injection INJECT IN TO THE MUSCLE AS DIRECTED FOR SEVERE ALLERGICE REACTION 04/25/18   Lucille Passy, MD  fluticasone [facility]) 50 MCG/ACT nasal spray Place 1 spray into both nostrils 2 (two) times daily as needed for allergies. 05/03/18   Lucille Passy, MD  folic acid (FOLVITE) 1 MG tablet Take 1 tablet (1 mg total) by mouth daily. 10/31/16   Isaac Bliss, Rayford Halsted, MD  furosemide (LASIX) 20 MG tablet TAKE 2 TABLETS DAILY 04/20/18   Lucille Passy, MD  lactulose (CHRONULAC) 10 GM/15ML solution Take 15 mLs (10 g total) by mouth 2 (two) times daily as needed for mild constipation (titrate to at least 2-3 loose BM daily). 01/25/18   Elby Beck, FNP  lidocaine (XYLOCAINE) 5 % ointment Apply 1 application topically as needed.  Patient taking differently: Apply 1 application topically 2 (two) times daily as needed for mild pain.  12/30/17   Elby Beck, FNP  magnesium oxide (MAG-OX) 400 (241.3 Mg) MG tablet Take 1 tablet (400 mg total) by mouth daily. 04/12/17   Shelly Coss, MD  Melatonin 5 MG TABS Take 20 mg by mouth at bedtime.    [provider]  methocarbamol (ROBAXIN) 750 MG tablet Take 1 tablet (750 mg total) by mouth 2 (two) times daily as needed for muscle spasms. 04/25/18   Lucille Passy, MD  metoprolol tartrate (LOPRESSOR) 25 MG tablet Take 1 tablet (25 mg total) by mouth 2 (two) times daily. 04/25/18 01/20/19  Lucille Passy, MD  Multiple Vitamin (MULTIVITAMIN WITH MINERALS) TABS tablet Take 1 tablet by mouth daily. 10/31/16   Isaac Bliss, Rayford Halsted, MD  ondansetron (ZOFRAN) 4 MG tablet Take 1-2 tablets (4-8 mg total) by mouth every 8 (eight) hours as needed for nausea or vomiting. 02/15/18   Lucille Passy, MD  pantoprazole (PROTONIX) 40 MG tablet Take 1 tablet (40 mg total) by mouth daily. 04/25/18   Lucille Passy, MD  promethazine (PHENERGAN) 25 MG tablet TAKE 1 TABLET DAILY AS NEEDED FOR NAUSEA 03/14/18   Lucille Passy, MD  spironolactone (ALDACTONE) 50 MG tablet Take 0.5-1 tablets (25-50 mg total) by mouth daily. 06/09/18   Lucille Passy, MD  Thiamine HCl (VITAMIN B-1) 100 MG tablet Take 100 mg by mouth daily. Reported on 02/17/2015    [provider]  traZODone (DESYREL) 50 MG tablet Take 1 tablet (50 mg total) by mouth at bedtime. 04/25/18   Lucille Passy, MD    Family History Family History  Problem Relation Age of Onset  . Heart disease Father   . Diabetes Father   . Stroke Father   . Depression Mother   . Heart disease Brother   . Alcohol abuse Brother   . Liver disease Brother     Social History Social History   Tobacco Use  . Smoking status: Former Smoker    Types: Pipe  . Smokeless tobacco: Never Used  . Tobacco comment: 05/17/2016 "stopped in the late 1990s"   Substance Use Topics  . Alcohol use: Not Currently    Alcohol/week: 28.0 standard drinks    Types: 28 Glasses of wine per week    Comment: last drink 10/22/17  . Drug  use: No     Allergies   Nifedipine and Bee venom   Review of Systems Review of Systems  Unable to perform ROS: Patient unresponsive     Physical Exam Updated Vital Signs Ht 5\' 10"  (1.778 m)   Wt 89.5 kg   BMI 28.31 kg/m   Physical Exam Vitals signs and nursing note reviewed.  Constitutional:      General: He is in acute distress.     Appearance: He is obese.     Comments: GCS 3, unresponsive  HENT:     Head: Atraumatic.  Eyes:     Pupils:     Right eye: Pupil is not reactive. Pupil is round.     Left eye: Pupil is not reactive. Pupil is round.  Cardiovascular:     Rate and Rhythm: Normal rate.  Pulmonary:     Breath sounds: Rhonchi present.  Abdominal:    Neurological:     Mental Status: He is unresponsive.     GCS: GCS eye subscore is 1. GCS verbal subscore is 1. GCS motor subscore is 1.     Comments: GCS 3, unresponsive.  Pupils 3 mm and nonreactive.  No blink to threat.  No response to pain.  Lungs coarse bilaterally.  Bruising on left torso.       ED Treatments / Results  Labs (all labs ordered are listed, but only abnormal results are displayed) Labs Reviewed  CBC - Abnormal; Notable for the following components:      Result Value   WBC 19.7 (*)    RBC 2.94 (*)    Hemoglobin 8.9 (*)    HCT 28.1 (*)    Platelets 125 (*)    nRBC 0.5 (*)    All other components within normal limits  PROTIME-INR - Abnormal; Notable for the following components:   Prothrombin Time 18.8 (*)    INR 1.6 (*)    All other components within normal limits  COMPREHENSIVE METABOLIC PANEL - Abnormal; Notable for the following components:   Sodium 120 (*)    Chloride 81 (*)    CO2 18 (*)    Glucose, Bld 142 (*)    BUN 34 (*)    Creatinine, Ser 2.77 (*)    Calcium 8.1 (*)    Total Protein 6.3 (*)     GFR calc non Af Amer 22 (*)    GFR calc Af Amer 25 (*)    Anion gap 21 (*)    All other components within normal limits  LACTIC ACID, PLASMA - Abnormal; Notable for the following components:   Lactic Acid, Venous 7.6 (*)    All other components within normal limits  I-STAT CREATININE, ED - Abnormal; Notable for the following components:   Creatinine, Ser 2.70 (*)    All other components within normal limits  I-STAT CHEM 8, ED - Abnormal; Notable for the following components:   Sodium 117 (*)    Chloride 84 (*)    BUN 37 (*)    Creatinine, Ser 2.60 (*)    Glucose, Bld 127 (*)    Calcium, Ion 1.00 (*)    Hemoglobin 9.9 (*)    HCT 29.0 (*)    All other components within normal limits  TROPONIN I (HIGH SENSITIVITY) - Abnormal; Notable for the following components:   Troponin I (High Sensitivity) 31 (*)    All other components within normal limits  SARS CORONAVIRUS 2 (HOSPITAL ORDER, [facility])  CULTURE,  BLOOD (ROUTINE X 2)  CULTURE, BLOOD (ROUTINE X 2)  LACTIC ACID, PLASMA  ETHANOL  TYPE AND SCREEN  PREPARE FRESH FROZEN PLASMA  PREPARE PLATELET PHERESIS  PREPARE CRYOPRECIPITATE  TROPONIN I (HIGH SENSITIVITY)    EKG EKG Interpretation  Date/Time:  10/14/2018 11:28:14 EDT Ventricular Rate:  90 PR Interval:    QRS Duration: 140 QT Interval:  379 QTC Calculation: 464 R Axis:   -6 Text Interpretation:  Accelerated junctional rhythm Left bundle branch block when compared to prior, sharper t waves with junctional rhythm. new t wave inversions.  No STEMI Confirmed by Antony Blackbird [phone]) on 14-Oct-2018 11:46:18 AM   Radiology Dg Pelvis Portable  Result Date: 14-Oct-2018 CLINICAL DATA:  Cardiac arrest. EXAM: PORTABLE PELVIS 1-2 VIEWS COMPARISON:  Radiographs October 24, 2016. FINDINGS: There is no evidence of pelvic fracture or diastasis. No pelvic bone lesions are seen. Status post left total hip arthroplasty. IMPRESSION: No acute  abnormality seen in the pelvis. Electronically Signed   By: Marijo Conception M.D.   On: 10/14/18 12:16   Dg Chest Portable 1 View  Result Date: Oct 14, 2018 CLINICAL DATA:  Cardiac arrest. EXAM: PORTABLE CHEST 1 VIEW COMPARISON:  Radiograph of June 29, 2018. FINDINGS: Mild cardiomegaly is noted. Nasogastric tube is seen entering stomach. Endotracheal tube is seen projected over tracheal air shadow, but approximately 12 cm above the carina; advancement is recommended. Old left rib fractures are noted. No pneumothorax is noted. Right lung is clear. Mild left basilar atelectasis is noted with small left pleural effusion. IMPRESSION: Endotracheal tube tip is 12 cm above the carina; advancement is recommended. Nasogastric tube appears to be in grossly good position. Mild left basilar atelectasis is noted with small left pleural effusion. No definite pneumothorax is noted. Old left rib fractures are noted. Electronically Signed   By: Marijo Conception M.D.   On: 10-14-2018 12:15    Procedures Procedure Name: Intubation Date/Time: 10-14-2018 1:49 PM Performed by: Courtney Paris, MD Pre-anesthesia Checklist: Patient identified, Emergency Drugs available, Suction available, Patient being monitored and Timeout performed Preoxygenation: Pre-oxygenation with 100% oxygen Laryngoscope Size: Glidescope Grade View: Grade I Tube size: 7.0 mm Number of attempts: 1 Placement Confirmation: ETT inserted through vocal cords under direct vision and Breath sounds checked- equal and bilateral Secured at: 21 cm Tube secured with: ETT holder Dental Injury: Teeth and Oropharynx as per pre-operative assessment       (including critical care time)  CRITICAL CARE Performed by: Gwenyth Allegra Melvyn Hommes Total critical care time: 60 minutes Critical care time was exclusive of separately billable procedures and treating other patients. Critical care was necessary to treat or prevent imminent or life-threatening  deterioration. Critical care was time spent personally by me on the following activities: development of treatment plan with patient and/or surrogate as well as nursing, discussions with consultants, evaluation of patient's response to treatment, examination of patient, obtaining history from patient or surrogate, ordering and performing treatments and interventions, ordering and review of laboratory studies, ordering and review of radiographic studies, pulse oximetry and re-evaluation of patient's condition.   Cardiopulmonary Resuscitation (CPR) Procedure Note Directed/Performed by: Gwenyth Allegra Hurbert Duran I personally directed ancillary staff and/or performed CPR in an effort to regain return of spontaneous circulation and to maintain cardiac, neuro and systemic perfusion.   EMERGENCY DEPARTMENT [place] FAST EXAM "Limited Ultrasound of the Abdomen and Pericardium" (FAST Exam).   INDICATIONS:Blunt injury of abdomen Multiple views of the abdomen and pericardium are obtained with a  multi-frequency probe.  PERFORMED BY: Myself IMAGES ARCHIVED?: Yes LIMITATIONS:  Emergent procedure  And large hematoma on left abdomen INTERPRETATION:  Indeterminate abdominal study. No pericardial effusion   Medications Ordered in ED Medications  sodium chloride flush (NS) 0.9 % injection 3 mL (has no administration in time range)  sodium chloride 0.9 % bolus 1,000 mL (has no administration in time range)  EPINEPHrine (ADRENALIN) 4 mg in NS 250 mL (0.016 mg/mL) premix infusion (has no administration in time range)  EPINEPHrine (ADRENALIN) 1 MG/10ML injection (1 Syringe Intravenous Given 10/08/2018 1118)     Initial Impression / Assessment and Plan / ED Course  I have reviewed the triage vital signs and the nursing notes.  Pertinent labs & imaging results that were available during my care of the patient were reviewed by me and considered in my medical decision making (see chart for details).        [name] is a 73 y.o. male with a EMS reported past medical history significant for alcohol abuse and possible trauma 2 days prior who presents as a CPR in progress.  Patient arrived having received approximately 1 hour of chest compressions and 7 doses of IO epinephrine.  Patient was in PEA arrest the entire time.  He never had a shockable rhythm.  Patient had a King airway in place and arrived to the emergency department.  On arrival, patient had CPR continued with compressions and 1 more dose of epinephrine totaling 8 mg of epinephrine.  Patient had a pulse check and ROSC was achieved.  Initial blood pressure was around [id number] systolic.  Patient had EKG which looked abnormal and cardiology was called to evaluate.  Patient was intubated for airway protection as his GCS was 3.  Patient was also hypoxic.  Patient intubated without difficulty and chest x-ray was obtained.  Tube was shallow and was advanced by respiratory therapy.  Chest x-ray on my evaluation at the bedside shows concern for possible hemothorax on the left with rib fractures.  EMS did report that he had a fall 2 days ago landing on his left side with significant bruising on the left torso.  FAST exam was attempted however due to the large hematoma, it was indeterminate.  Now that trauma appears to have played a role with his cardiac arrest, hypotension, intubation, and injury, level 1 trauma was activated.  Patient started receiving blood and was on epinephrine drip.  Trauma arrived at the bedside and placed a chest tube with significant blood output.  Patient had an episode of pulselessness again after bradycardia and Rosc was again achieved.  I was able to speak with the patient's wife who reports that patient is an alcoholic and had a fall on Sunday as well as a fall today.  Patient has been "acting different" for the last few days.  Patient's labs returned showing evidence of lactic acidosis, severe hyponatremia, elevated troponin, kidney  injury, leukocytosis, anemia, elevated INR, and electrolyte imbalances.  Wife reports that patient is a DNR and would not want further resuscitative efforts if his heart was to stop again.  Patient went back into PEA arrest and the trauma team declared death at 12:48 PM.  Please see trauma's note for central line and chest tube management.  Trauma requested the medical examiner be called and they will see the patient.  I personally notified the patient's wife who is coming to see the patient at this time.    Final Clinical Impressions(s) / ED Diagnoses  Final diagnoses:  Cardiac arrest [facility])  Fall, initial encounter  Acute respiratory failure with hypoxia ([place])    Clinical Impression: 1. Cardiac arrest ([place])   2. Trauma   3. Fall, initial encounter   4. Acute respiratory failure with hypoxia ([facility])     Disposition: Expired  This note was prepared with assistance of Dragon voice recognition software. Occasional wrong-word or sound-a-like substitutions may have occurred due to the inherent limitations of voice recognition software.     [place] Moyd, Gwenyth Allegra, MD 09-Oct-2018 [phone]

## 2018-10-12 NOTE — Telephone Encounter (Signed)
Pt has passed away.  

## 2018-10-12 NOTE — Code Documentation (Signed)
Family updated as to patient's status. Wife at bedside with chaplain

## 2018-10-12 NOTE — Code Documentation (Signed)
Rhythm changed--wide complex, no pulses-- CPR started-

## 2018-10-12 NOTE — Procedures (Signed)
Central Venous Catheter Insertion Procedure Note [name] [id number] 1945/02/27  Procedure: Insertion of Central Venous Catheter Indications: Drug and/or fluid administration  Procedure Details Consent: Unable to obtain consent because of emergent medical necessity. Time Out: Verified patient identification, verified procedure, site/side was marked, verified correct patient position, special equipment/implants available, medications/allergies/relevent history reviewed, required imaging and test results available.  Performed  Maximum sterile technique was used including antiseptics, cap, gloves, gown, hand hygiene, mask and sheet. Skin prep: Chlorhexidine; local anesthetic administered A antimicrobial bonded/coated triple lumen catheter was placed in the left subclavian vein using the Seldinger technique.  Evaluation Blood flow good Complications: No apparent complications but remained unstable. Patient did tolerate procedure well. Chest X-ray ordered to verify placement.  CXR: pending.  Zenovia Jarred 09/25/2018, 1:03 PM

## 2018-10-12 NOTE — ED Notes (Signed)
Pt wife Hosie Nordell [phone]

## 2018-10-12 NOTE — Code Documentation (Signed)
Received Epi x 7 enroute- remained in PEA

## 2018-10-12 NOTE — Progress Notes (Signed)
Orthopedic Tech Progress Note Patient Details:  [name] [birth year] [id number] Level 1 trauma Patient ID: [name], male   DOB: November 15, 1945, 73 y.o.   MRN: [id number]   [name] 10/11/18, 12:11 PM

## 2018-10-12 NOTE — Code Documentation (Signed)
Epi gtt increased to 57mcg

## 2018-10-12 NOTE — ED Notes (Signed)
Got patient on the monitor did ekg shown to Dr Sherry Ruffing nurse at bedside

## 2018-10-12 NOTE — Procedures (Signed)
Chest Tube Insertion Procedure Note  Indications:  Clinically significant left hemothorax  Pre-operative Diagnosis: Large left hemothorax  Post-operative Diagnosis: Large left hemothorax  Procedure Details  Emergency consent  After sterile skin prep, using standard technique, a 14 French tube was placed in the left anterior axillary line cephalad to the nipple using Seldinger technique.  It was immediately hooked up to a Pleur-evac and 2 L of old dark appearing blood was returned. Tube was sutured in place.  Findings: 2L blood   Estimated Blood Loss:  above         Specimens:  None              Complications: No complications from the procedure, however, the patient remained hemodynamically unstable.  Auto transfuser was connected.         Disposition: Trauma bay         Condition: unstable  Georganna Skeans, MD, MPH, FACS Trauma & General Surgery: [phone]

## 2018-10-12 NOTE — ED Notes (Signed)
Blood bank notified for MTP

## 2018-10-12 NOTE — Code Documentation (Signed)
Patient time of death occurred at 90.

## 2018-10-12 NOTE — Code Documentation (Signed)
Decision to call level 1 trauma made-- trauma called -- dr Grandville Silos in at 1204

## 2018-10-12 NOTE — ED Notes (Signed)
Central line placed per dr Grandville Silos-- left subclavian

## 2018-10-12 NOTE — ED Notes (Signed)
Dr Grandville Silos at bedside- chest tube placed in left side--  1st unit of PRBCs started at 1205 2nd unit of PRBCs started at 1218

## 2018-10-12 DEATH — deceased

## 2019-07-15 IMAGING — DX DG CHEST 2V
2 series · 2 of 2 positions shown · non-contrast
Comparison: Radiograph November 10, 2017.

CLINICAL DATA: Dyspnea on exertion.

EXAM:
CHEST - 2 VIEW

[chest pa]
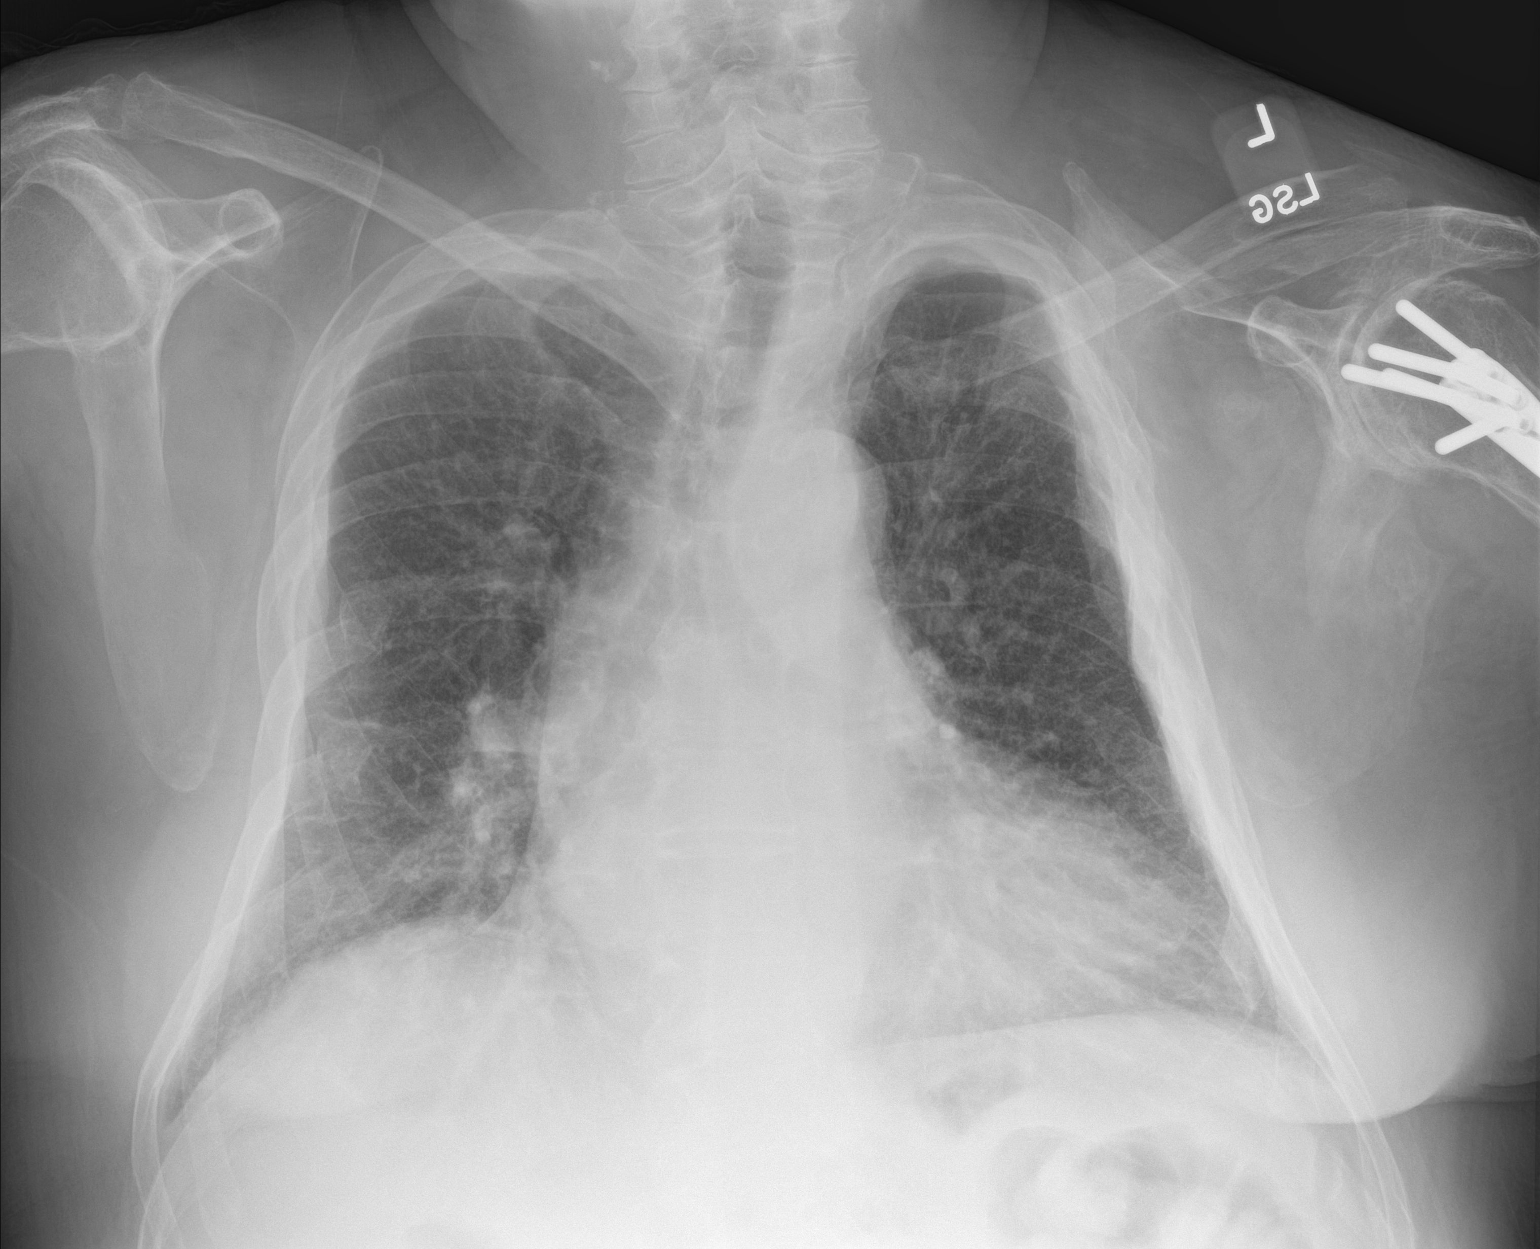

[chest lat]
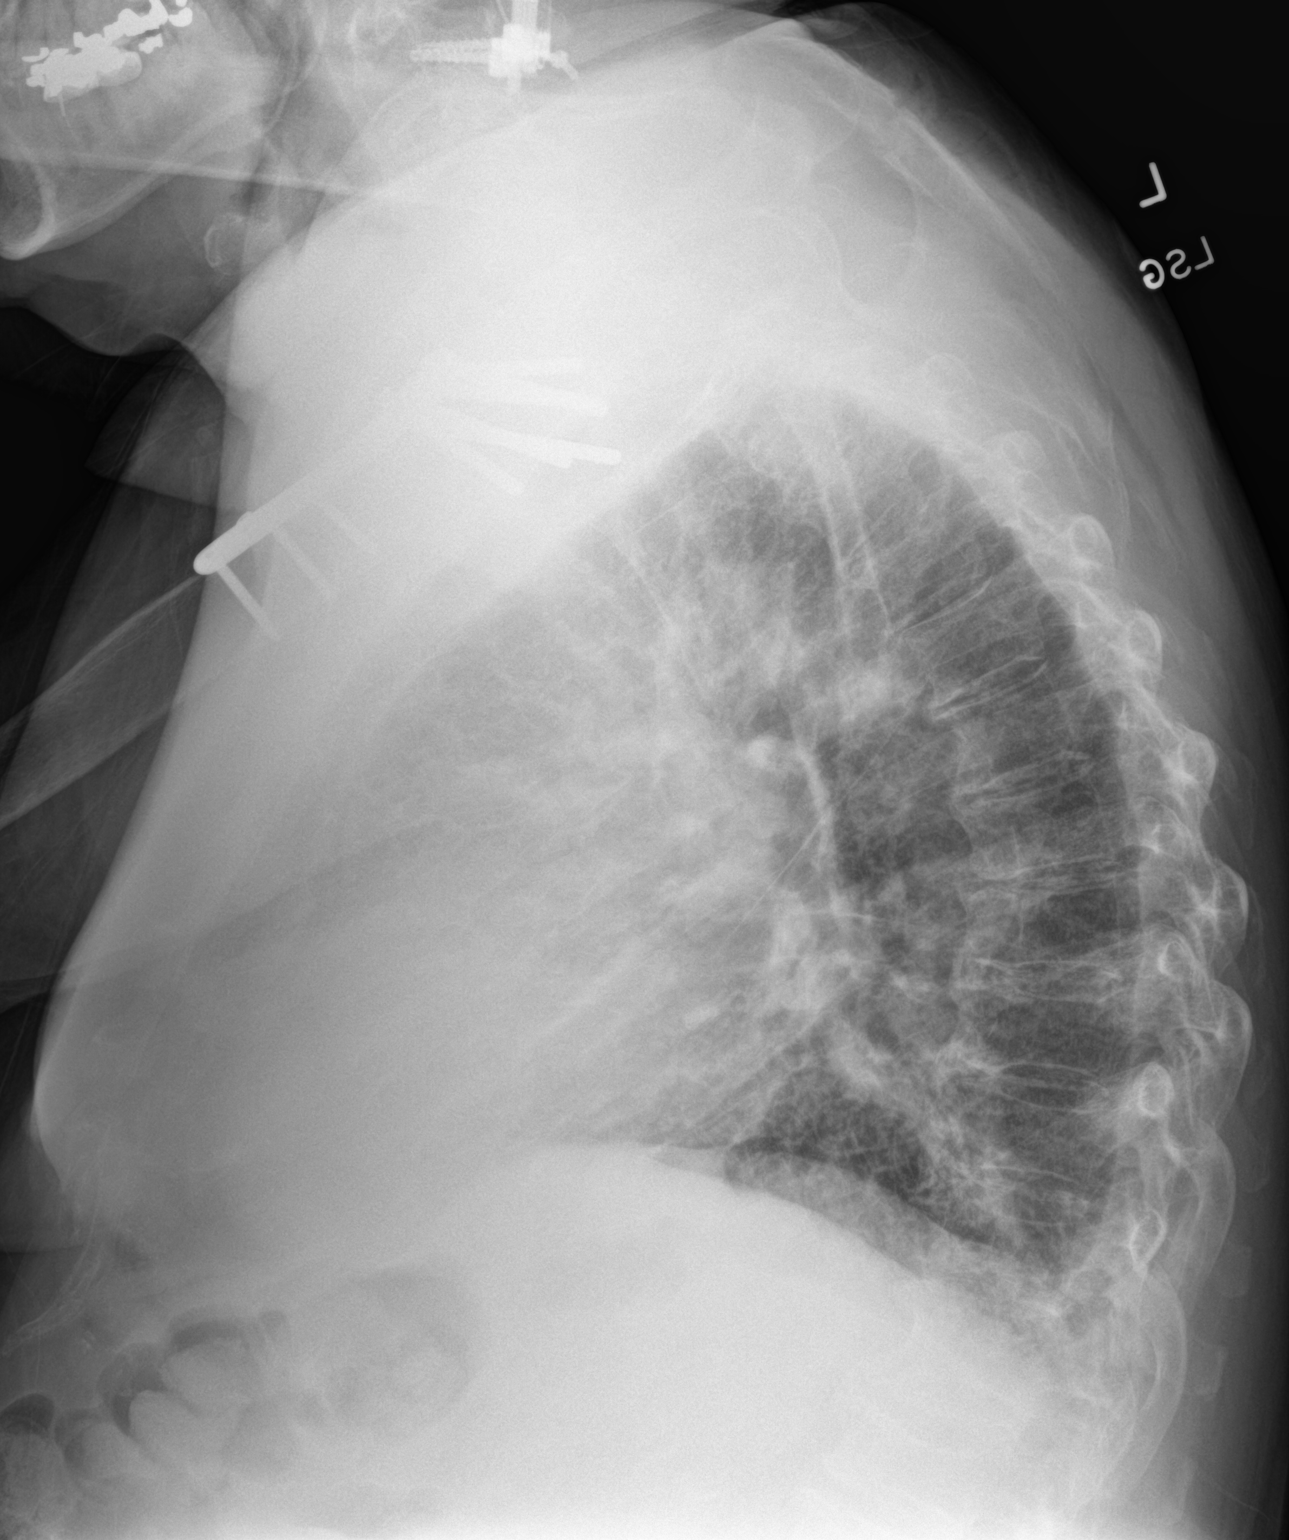

[2 of 2 positions shown; findings below may reference images not displayed]

FINDINGS: Stable cardiomegaly. No pneumothorax or pleural effusion is noted.
No pneumothorax or pleural effusion is noted. Old bilateral rib
fractures are noted. Right-sided PICC line has been removed. No
consolidative process is noted.
IMPRESSION: No active cardiopulmonary disease.

## 2019-07-16 IMAGING — CT CT HEAD W/O CM
3 series · 15 of 47 positions shown, 18 images · non-contrast
Comparison: Most recent comparison 07/29/2017

CLINICAL DATA: Altered mental status. Confusion.

EXAM:
CT HEAD WITHOUT CONTRAST
TECHNIQUE: Contiguous axial images were obtained from the base of the skull
through the vertex without intravenous contrast.

[Series 3: head 5.0 h30s · axial · 0.41mm/px · z∈[-134,+0]mm · 9 of 33 slices shown, 12 images]
[im 3/33  brain]
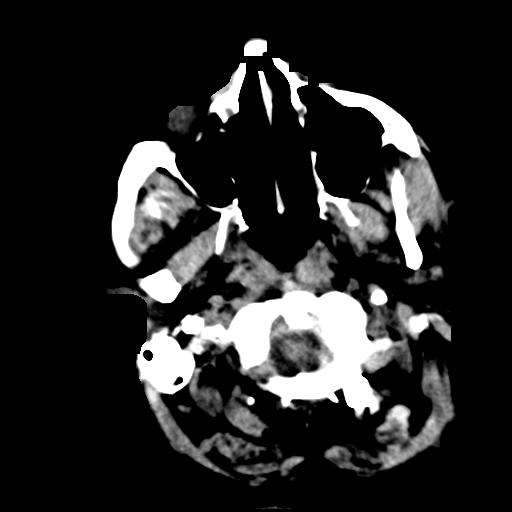
[im 3/33  bone]
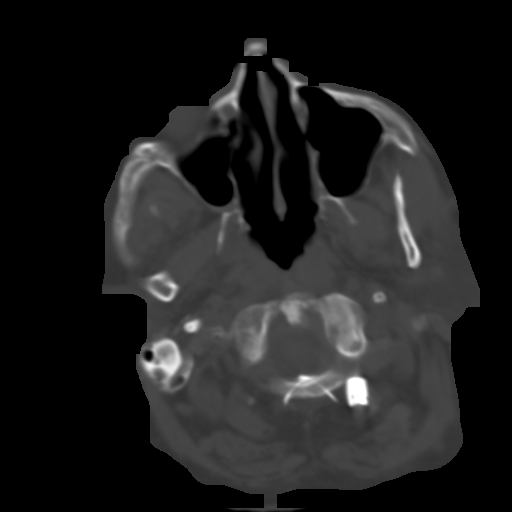
[im 6/33  brain]
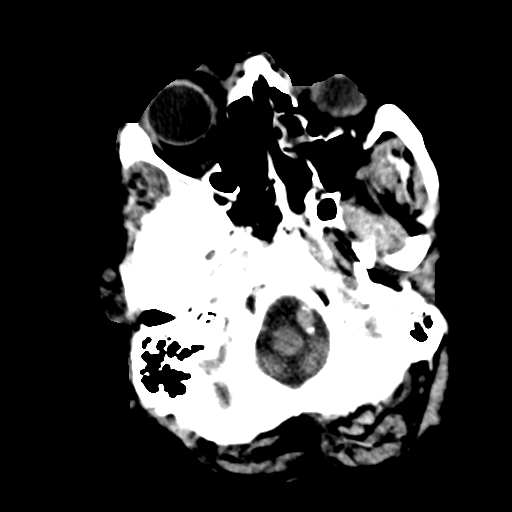
[im 9/33  brain]
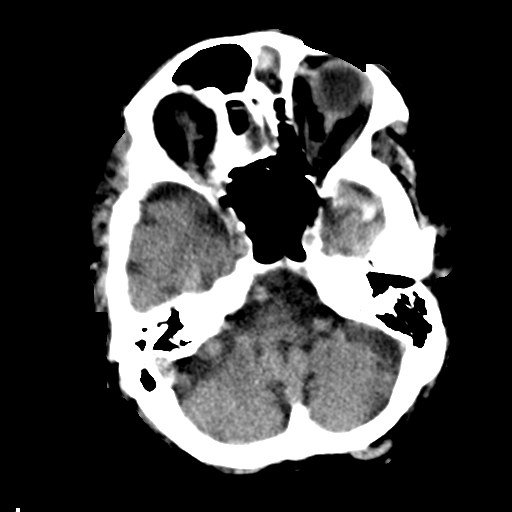
[im 13/33  brain]
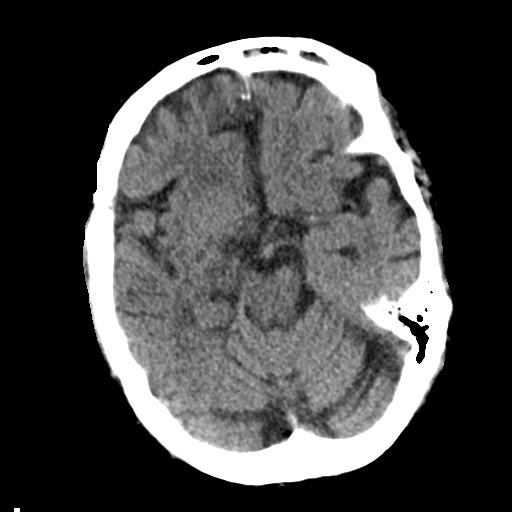
[im 17/33  brain]
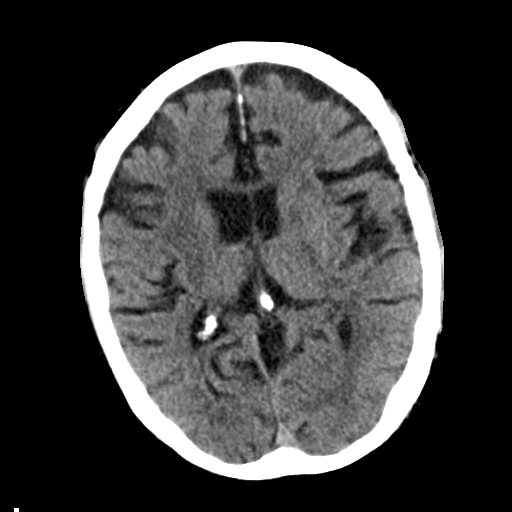
[im 17/33  bone]
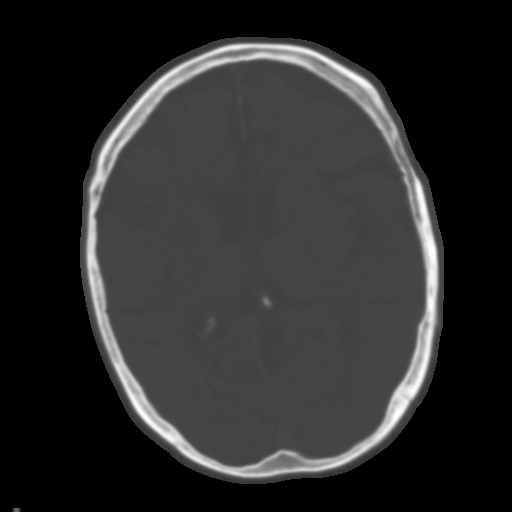
[im 20/33  brain]
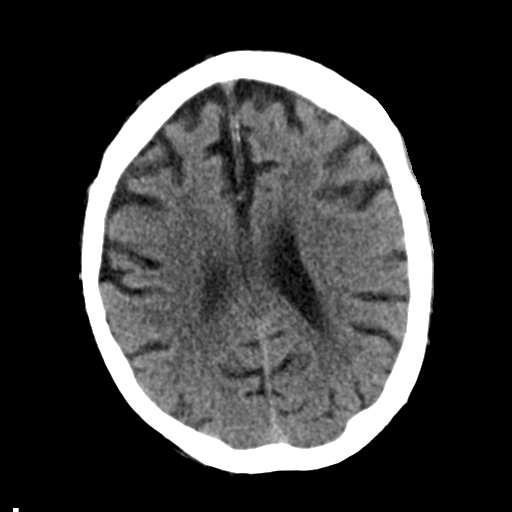
[im 24/33  brain]
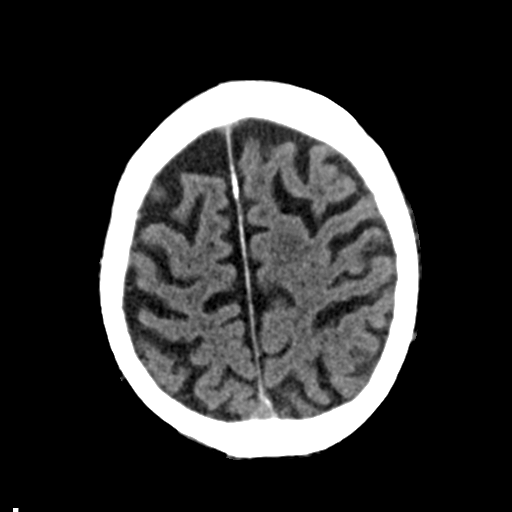
[im 27/33  brain]
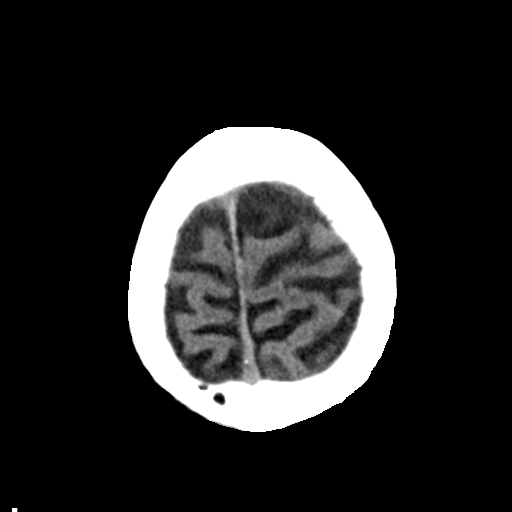
[im 30/33  brain]
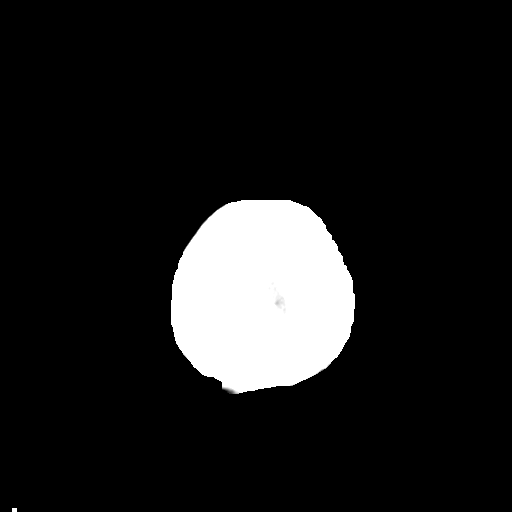
[im 30/33  bone]
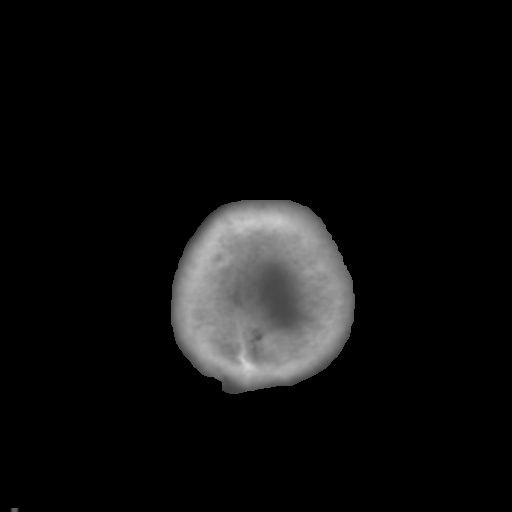

[Series 5: head 3.0 mpr cor · coronal · 0.32mm/px · 3 of 67 slices shown]
[im 23/67  brain]
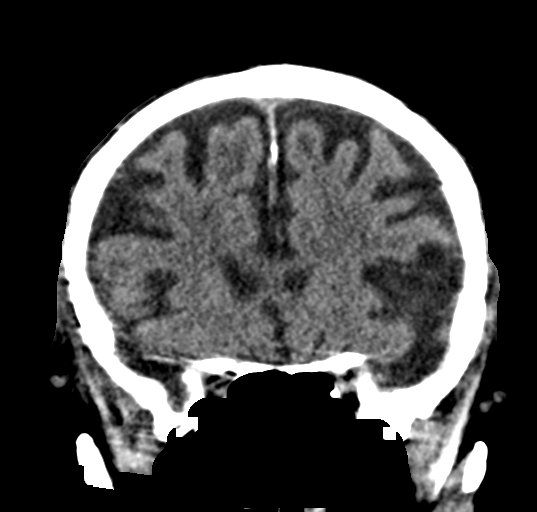
[im 30/67  brain]
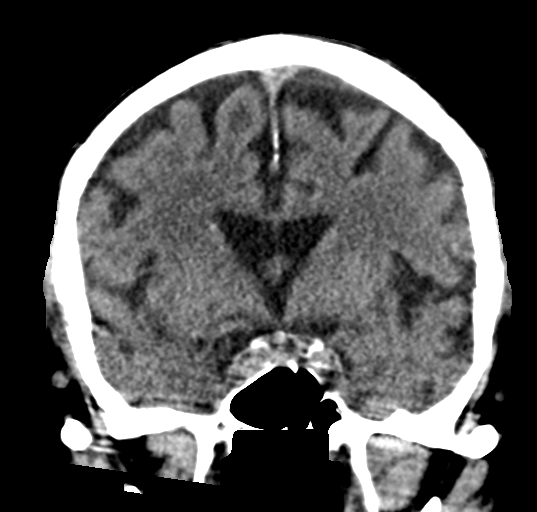
[im 37/67  brain]
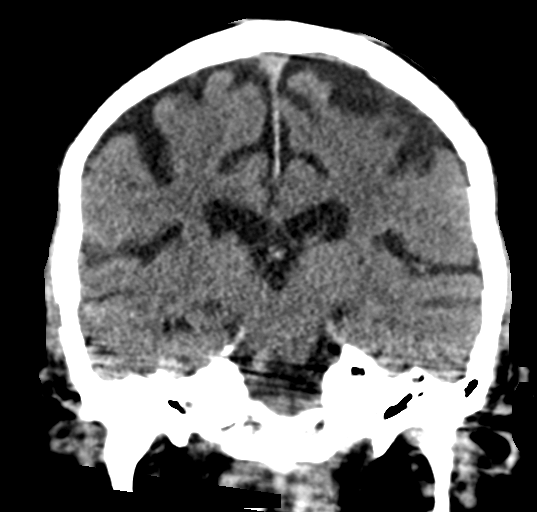

[Series 6: head 3.0 mpr sag · sagittal · 0.32mm/px · 3 of 53 slices shown]
[im 18/53  brain]
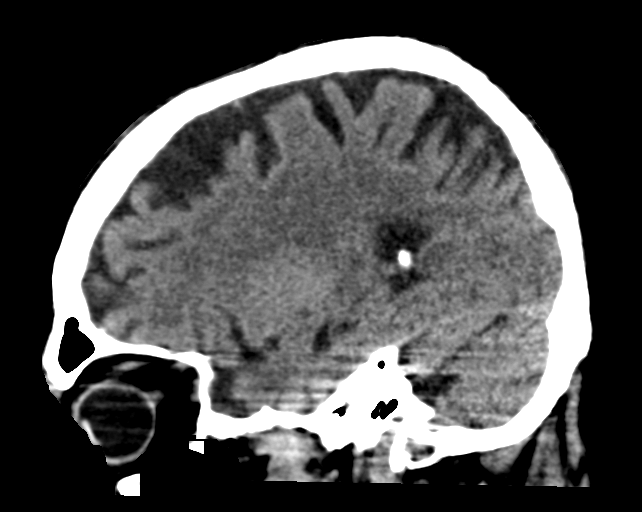
[im 27/53  brain]
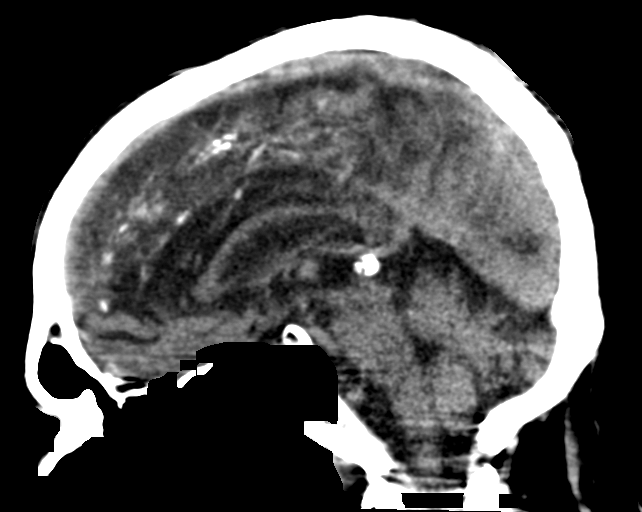
[im 35/53  brain]
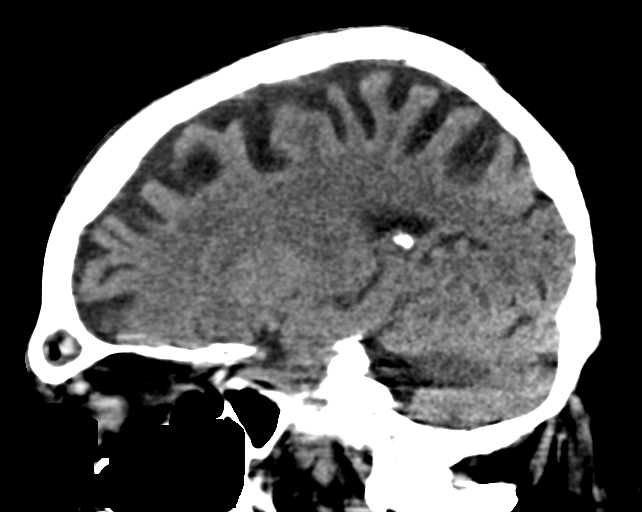

[15 of 47 positions shown; findings below may reference images not displayed]

FINDINGS: Brain: No intracranial hemorrhage, mass effect, or midline shift. No
hydrocephalus. The basilar cisterns are patent. No evidence of
territorial infarct or acute ischemia. Unchanged degree of atrophy
and chronic small vessel ischemia. No extra-axial or intracranial
fluid collection.

Vascular: Atherosclerosis of skullbase vasculature without
hyperdense vessel or abnormal calcification.

Skull: Upper cervical fusion hardware is partially included. No
fracture or focal lesion.

Sinuses/Orbits: Chronic mucosal thickening of the left frontal sinus
with progression from prior exam. Chronic opacification of scattered
left ethmoid air cells. Chronic opacification of scattered mastoid
air cells with slight progression. Bilateral cataract resection.

Other: None.
IMPRESSION: 1. No acute intracranial abnormality.
2. Unchanged atrophy and chronic small vessel ischemia.
3. Chronic sinusitis with slight increased inflammation from [DATE]
CT.
